# Patient Record
Sex: Male | Born: 1965
Health system: Southern US, Community
[De-identification: ages and names within clinical notes are randomized; demographics above are authoritative.]

## PROBLEM LIST (undated history)

## (undated) DIAGNOSIS — A0472 Enterocolitis due to Clostridium difficile, not specified as recurrent: Secondary | ICD-10-CM

## (undated) DIAGNOSIS — I4901 Ventricular fibrillation: Secondary | ICD-10-CM

## (undated) DIAGNOSIS — I2510 Atherosclerotic heart disease of native coronary artery without angina pectoris: Secondary | ICD-10-CM

## (undated) DIAGNOSIS — I5022 Chronic systolic (congestive) heart failure: Secondary | ICD-10-CM

## (undated) DIAGNOSIS — I700 Atherosclerosis of aorta: Secondary | ICD-10-CM

## (undated) DIAGNOSIS — I472 Ventricular tachycardia, unspecified: Secondary | ICD-10-CM

## (undated) DIAGNOSIS — I255 Ischemic cardiomyopathy: Secondary | ICD-10-CM

## (undated) DIAGNOSIS — E785 Hyperlipidemia, unspecified: Secondary | ICD-10-CM

## (undated) DIAGNOSIS — Z5189 Encounter for other specified aftercare: Secondary | ICD-10-CM

## (undated) DIAGNOSIS — K219 Gastro-esophageal reflux disease without esophagitis: Secondary | ICD-10-CM

## (undated) DIAGNOSIS — I739 Peripheral vascular disease, unspecified: Secondary | ICD-10-CM

## (undated) DIAGNOSIS — I219 Acute myocardial infarction, unspecified: Secondary | ICD-10-CM

## (undated) DIAGNOSIS — N1831 Chronic kidney disease, stage 3a: Secondary | ICD-10-CM

## (undated) DIAGNOSIS — E669 Obesity, unspecified: Secondary | ICD-10-CM

## (undated) DIAGNOSIS — I10 Essential (primary) hypertension: Secondary | ICD-10-CM

## (undated) DIAGNOSIS — F1210 Cannabis abuse, uncomplicated: Secondary | ICD-10-CM

## (undated) DIAGNOSIS — K921 Melena: Secondary | ICD-10-CM

## (undated) DIAGNOSIS — I5042 Chronic combined systolic (congestive) and diastolic (congestive) heart failure: Secondary | ICD-10-CM

## (undated) DIAGNOSIS — I509 Heart failure, unspecified: Secondary | ICD-10-CM

## (undated) DIAGNOSIS — I471 Supraventricular tachycardia, unspecified: Secondary | ICD-10-CM

## (undated) DIAGNOSIS — Z87891 Personal history of nicotine dependence: Secondary | ICD-10-CM

## (undated) DIAGNOSIS — I480 Paroxysmal atrial fibrillation: Secondary | ICD-10-CM

## (undated) HISTORY — DX: Acute myocardial infarction, unspecified: I21.9

## (undated) HISTORY — DX: Ventricular fibrillation: I49.01

## (undated) HISTORY — DX: Peripheral vascular disease, unspecified: I73.9

## (undated) HISTORY — DX: Ischemic cardiomyopathy: I25.5

## (undated) HISTORY — DX: Melena: K92.1

## (undated) HISTORY — DX: Cannabis abuse, uncomplicated: F12.10

## (undated) HISTORY — DX: Atherosclerotic heart disease of native coronary artery without angina pectoris: I25.10

## (undated) HISTORY — DX: Heart failure, unspecified: I50.9

## (undated) HISTORY — DX: Essential (primary) hypertension: I10

## (undated) HISTORY — DX: Gastro-esophageal reflux disease without esophagitis: K21.9

## (undated) HISTORY — DX: Hyperlipidemia, unspecified: E78.5

## (undated) HISTORY — PX: CIRCUMCISION: SUR203

## (undated) HISTORY — PX: CARDIAC DEFIBRILLATOR PLACEMENT: SHX171

## (undated) HISTORY — DX: Encounter for other specified aftercare: Z51.89

---

## 2001-05-28 ENCOUNTER — Encounter: Payer: Self-pay | Admitting: Emergency Medicine

## 2001-05-28 ENCOUNTER — Emergency Department (HOSPITAL_COMMUNITY): Admission: EM | Admit: 2001-05-28 | Discharge: 2001-05-28 | Payer: Self-pay | Admitting: Emergency Medicine

## 2001-06-13 ENCOUNTER — Encounter: Payer: Self-pay | Admitting: Cardiology

## 2001-06-13 ENCOUNTER — Inpatient Hospital Stay (HOSPITAL_COMMUNITY): Admission: AD | Admit: 2001-06-13 | Discharge: 2001-06-22 | Payer: Self-pay | Admitting: Cardiology

## 2001-06-17 ENCOUNTER — Encounter: Payer: Self-pay | Admitting: Cardiology

## 2001-06-17 HISTORY — PX: CORONARY ARTERY BYPASS GRAFT: SHX141

## 2001-06-18 ENCOUNTER — Encounter: Payer: Self-pay | Admitting: Cardiothoracic Surgery

## 2001-06-19 ENCOUNTER — Encounter: Payer: Self-pay | Admitting: Cardiothoracic Surgery

## 2001-06-20 ENCOUNTER — Encounter: Payer: Self-pay | Admitting: Cardiothoracic Surgery

## 2001-06-21 ENCOUNTER — Encounter: Payer: Self-pay | Admitting: Cardiothoracic Surgery

## 2001-06-25 ENCOUNTER — Encounter: Payer: Self-pay | Admitting: *Deleted

## 2001-06-25 ENCOUNTER — Emergency Department (HOSPITAL_COMMUNITY): Admission: EM | Admit: 2001-06-25 | Discharge: 2001-06-26 | Payer: Self-pay | Admitting: *Deleted

## 2001-07-12 ENCOUNTER — Encounter: Admission: RE | Admit: 2001-07-12 | Discharge: 2001-07-12 | Payer: Self-pay | Admitting: Cardiothoracic Surgery

## 2001-07-12 ENCOUNTER — Encounter: Payer: Self-pay | Admitting: Cardiothoracic Surgery

## 2004-08-19 ENCOUNTER — Ambulatory Visit (HOSPITAL_COMMUNITY): Admission: RE | Admit: 2004-08-19 | Discharge: 2004-08-19 | Payer: Self-pay | Admitting: Family Medicine

## 2004-12-07 ENCOUNTER — Ambulatory Visit: Payer: Self-pay | Admitting: Cardiology

## 2004-12-07 ENCOUNTER — Emergency Department (HOSPITAL_COMMUNITY): Admission: EM | Admit: 2004-12-07 | Discharge: 2004-12-07 | Payer: Self-pay | Admitting: Emergency Medicine

## 2005-08-23 ENCOUNTER — Ambulatory Visit: Payer: Self-pay | Admitting: Cardiology

## 2005-09-28 ENCOUNTER — Ambulatory Visit: Payer: Self-pay

## 2005-10-23 ENCOUNTER — Ambulatory Visit: Payer: Self-pay | Admitting: Cardiology

## 2005-11-02 ENCOUNTER — Ambulatory Visit: Payer: Self-pay | Admitting: Cardiology

## 2005-11-27 ENCOUNTER — Ambulatory Visit: Payer: Self-pay | Admitting: Cardiology

## 2005-12-25 ENCOUNTER — Ambulatory Visit: Payer: Self-pay | Admitting: Cardiology

## 2006-01-05 ENCOUNTER — Ambulatory Visit: Payer: Self-pay | Admitting: Cardiology

## 2006-02-16 ENCOUNTER — Ambulatory Visit: Payer: Self-pay | Admitting: Cardiology

## 2006-04-13 ENCOUNTER — Ambulatory Visit: Payer: Self-pay | Admitting: Cardiology

## 2006-04-13 LAB — CONVERTED CEMR LAB
Alkaline Phosphatase: 79 units/L (ref 39–117)
Total Protein: 7.1 g/dL (ref 6.0–8.3)

## 2006-04-26 ENCOUNTER — Encounter: Payer: Self-pay | Admitting: Cardiology

## 2006-04-26 ENCOUNTER — Ambulatory Visit: Payer: Self-pay

## 2006-05-17 ENCOUNTER — Ambulatory Visit: Payer: Self-pay | Admitting: Cardiovascular Disease

## 2006-05-18 ENCOUNTER — Ambulatory Visit: Payer: Self-pay | Admitting: Cardiovascular Disease

## 2006-05-30 ENCOUNTER — Ambulatory Visit (HOSPITAL_COMMUNITY): Admission: RE | Admit: 2006-05-30 | Discharge: 2006-05-30 | Payer: Self-pay | Admitting: Cardiovascular Disease

## 2006-06-18 ENCOUNTER — Ambulatory Visit: Payer: Self-pay | Admitting: Cardiovascular Disease

## 2006-06-18 ENCOUNTER — Ambulatory Visit: Payer: Self-pay

## 2006-07-19 ENCOUNTER — Ambulatory Visit: Payer: Self-pay | Admitting: Cardiology

## 2006-07-23 ENCOUNTER — Ambulatory Visit (HOSPITAL_COMMUNITY): Admission: RE | Admit: 2006-07-23 | Discharge: 2006-07-23 | Payer: Self-pay | Admitting: Urology

## 2007-03-08 ENCOUNTER — Ambulatory Visit: Payer: Self-pay | Admitting: Pediatrics

## 2007-03-11 ENCOUNTER — Ambulatory Visit: Payer: Self-pay

## 2007-04-17 ENCOUNTER — Ambulatory Visit: Payer: Self-pay | Admitting: Cardiology

## 2007-04-17 LAB — CONVERTED CEMR LAB
AST: 27 units/L (ref 0–37)
Albumin: 4 g/dL (ref 3.5–5.2)
Alkaline Phosphatase: 65 units/L (ref 39–117)
Bilirubin, Direct: 0.1 mg/dL (ref 0.0–0.3)
Cholesterol: 178 mg/dL (ref 0–200)
HDL: 39.7 mg/dL (ref >39.0)
LDL Cholesterol: 119 mg/dL — ABNORMAL HIGH (ref 0–99)
Total Bilirubin: 0.8 mg/dL (ref 0.3–1.2)
Total CHOL/HDL Ratio: 4.5
VLDL: 20 mg/dL (ref 0–40)

## 2007-05-13 ENCOUNTER — Ambulatory Visit: Payer: Self-pay | Admitting: Cardiology

## 2007-12-19 ENCOUNTER — Ambulatory Visit: Payer: Self-pay | Admitting: Cardiology

## 2007-12-25 ENCOUNTER — Ambulatory Visit: Payer: Self-pay

## 2007-12-25 ENCOUNTER — Ambulatory Visit: Payer: Self-pay | Admitting: Cardiology

## 2007-12-25 LAB — CONVERTED CEMR LAB
AST: 34 units/L (ref 0–37)
Albumin: 4 g/dL (ref 3.5–5.2)
Alkaline Phosphatase: 82 units/L (ref 39–117)
BUN: 18 mg/dL (ref 6–23)
Bilirubin, Direct: 0.1 mg/dL (ref 0.0–0.3)
CO2: 26 meq/L (ref 19–32)
Calcium: 9.2 mg/dL (ref 8.4–10.5)
Chloride: 112 meq/L (ref 96–112)
Cholesterol: 149 mg/dL (ref 0–200)
Creatinine, Ser: 1 mg/dL (ref 0.4–1.5)
GFR calc Af Amer: 106 mL/min
GFR calc non Af Amer: 88 mL/min
LDL Cholesterol: 94 mg/dL (ref 0–99)
Potassium: 4.1 meq/L (ref 3.5–5.1)
Sodium: 140 meq/L (ref 135–145)
Total Bilirubin: 0.9 mg/dL (ref 0.3–1.2)
Total CHOL/HDL Ratio: 3.4
Total Protein: 7 g/dL (ref 6.0–8.3)

## 2007-12-26 ENCOUNTER — Inpatient Hospital Stay (HOSPITAL_COMMUNITY): Admission: AD | Admit: 2007-12-26 | Discharge: 2007-12-28 | Payer: Self-pay | Admitting: Cardiology

## 2007-12-26 ENCOUNTER — Ambulatory Visit: Payer: Self-pay | Admitting: Cardiology

## 2008-01-12 ENCOUNTER — Emergency Department (HOSPITAL_COMMUNITY): Admission: EM | Admit: 2008-01-12 | Discharge: 2008-01-12 | Payer: Self-pay | Admitting: Emergency Medicine

## 2008-01-13 ENCOUNTER — Ambulatory Visit: Payer: Self-pay | Admitting: Cardiology

## 2008-01-15 DIAGNOSIS — E7800 Pure hypercholesterolemia, unspecified: Secondary | ICD-10-CM | POA: Insufficient documentation

## 2008-01-15 DIAGNOSIS — I739 Peripheral vascular disease, unspecified: Secondary | ICD-10-CM

## 2008-01-15 DIAGNOSIS — I2510 Atherosclerotic heart disease of native coronary artery without angina pectoris: Secondary | ICD-10-CM

## 2008-01-15 DIAGNOSIS — K921 Melena: Secondary | ICD-10-CM | POA: Insufficient documentation

## 2008-01-15 DIAGNOSIS — E785 Hyperlipidemia, unspecified: Secondary | ICD-10-CM

## 2008-01-15 DIAGNOSIS — Z951 Presence of aortocoronary bypass graft: Secondary | ICD-10-CM | POA: Insufficient documentation

## 2008-01-15 DIAGNOSIS — R079 Chest pain, unspecified: Secondary | ICD-10-CM | POA: Insufficient documentation

## 2008-01-15 DIAGNOSIS — I10 Essential (primary) hypertension: Secondary | ICD-10-CM | POA: Insufficient documentation

## 2008-01-17 ENCOUNTER — Ambulatory Visit: Payer: Self-pay | Admitting: Internal Medicine

## 2008-01-27 ENCOUNTER — Ambulatory Visit: Payer: Self-pay | Admitting: Internal Medicine

## 2008-02-12 ENCOUNTER — Ambulatory Visit: Payer: Self-pay | Admitting: Cardiology

## 2008-02-12 LAB — CONVERTED CEMR LAB
Albumin: 4 g/dL (ref 3.5–5.2)
Cholesterol: 158 mg/dL (ref 0–200)
LDL Cholesterol: 101 mg/dL — ABNORMAL HIGH (ref 0–99)
Triglycerides: 70 mg/dL (ref 0–149)

## 2008-03-11 ENCOUNTER — Ambulatory Visit (HOSPITAL_COMMUNITY): Admission: RE | Admit: 2008-03-11 | Discharge: 2008-03-11 | Payer: Self-pay | Admitting: Internal Medicine

## 2008-03-11 ENCOUNTER — Ambulatory Visit: Payer: Self-pay | Admitting: Cardiovascular Disease

## 2008-08-25 DIAGNOSIS — I255 Ischemic cardiomyopathy: Secondary | ICD-10-CM | POA: Insufficient documentation

## 2008-09-09 ENCOUNTER — Encounter (INDEPENDENT_AMBULATORY_CARE_PROVIDER_SITE_OTHER): Payer: Self-pay | Admitting: *Deleted

## 2009-02-03 ENCOUNTER — Ambulatory Visit: Payer: Self-pay | Admitting: Cardiovascular Disease

## 2009-02-03 ENCOUNTER — Ambulatory Visit: Payer: Self-pay | Admitting: Pulmonary Disease

## 2009-02-03 ENCOUNTER — Inpatient Hospital Stay (HOSPITAL_COMMUNITY): Admission: EM | Admit: 2009-02-03 | Discharge: 2009-02-12 | Payer: Self-pay | Admitting: Emergency Medicine

## 2009-02-03 ENCOUNTER — Ambulatory Visit: Payer: Self-pay | Admitting: Cardiology

## 2009-02-04 ENCOUNTER — Encounter: Payer: Self-pay | Admitting: Internal Medicine

## 2009-02-04 ENCOUNTER — Encounter: Payer: Self-pay | Admitting: Cardiovascular Disease

## 2009-02-05 ENCOUNTER — Encounter: Payer: Self-pay | Admitting: Critical Care Medicine

## 2009-02-06 ENCOUNTER — Encounter: Payer: Self-pay | Admitting: Critical Care Medicine

## 2009-02-08 ENCOUNTER — Encounter: Payer: Self-pay | Admitting: Cardiovascular Disease

## 2009-02-09 ENCOUNTER — Encounter: Payer: Self-pay | Admitting: Cardiovascular Disease

## 2009-02-11 DIAGNOSIS — I4901 Ventricular fibrillation: Secondary | ICD-10-CM

## 2009-02-11 HISTORY — DX: Ventricular fibrillation: I49.01

## 2009-02-12 ENCOUNTER — Encounter: Payer: Self-pay | Admitting: Internal Medicine

## 2009-02-15 ENCOUNTER — Telehealth: Payer: Self-pay | Admitting: Cardiology

## 2009-02-15 ENCOUNTER — Ambulatory Visit: Payer: Self-pay | Admitting: Cardiology

## 2009-02-16 ENCOUNTER — Emergency Department (HOSPITAL_COMMUNITY): Admission: EM | Admit: 2009-02-16 | Discharge: 2009-02-16 | Payer: Self-pay | Admitting: Emergency Medicine

## 2009-02-19 ENCOUNTER — Encounter: Payer: Self-pay | Admitting: Cardiology

## 2009-02-20 ENCOUNTER — Inpatient Hospital Stay (HOSPITAL_COMMUNITY): Admission: EM | Admit: 2009-02-20 | Discharge: 2009-02-23 | Payer: Self-pay | Admitting: Emergency Medicine

## 2009-03-01 ENCOUNTER — Encounter: Payer: Self-pay | Admitting: Cardiology

## 2009-03-02 ENCOUNTER — Encounter: Payer: Self-pay | Admitting: Nurse Practitioner

## 2009-03-02 ENCOUNTER — Ambulatory Visit: Payer: Self-pay

## 2009-03-02 DIAGNOSIS — I4901 Ventricular fibrillation: Secondary | ICD-10-CM | POA: Insufficient documentation

## 2009-03-03 ENCOUNTER — Encounter: Payer: Self-pay | Admitting: Internal Medicine

## 2009-03-03 ENCOUNTER — Ambulatory Visit: Payer: Self-pay

## 2009-03-08 ENCOUNTER — Encounter: Payer: Self-pay | Admitting: Cardiology

## 2009-03-08 ENCOUNTER — Encounter (INDEPENDENT_AMBULATORY_CARE_PROVIDER_SITE_OTHER): Payer: Self-pay | Admitting: *Deleted

## 2009-03-14 ENCOUNTER — Inpatient Hospital Stay (HOSPITAL_COMMUNITY): Admission: EM | Admit: 2009-03-14 | Discharge: 2009-03-16 | Payer: Self-pay | Admitting: Emergency Medicine

## 2009-03-15 ENCOUNTER — Ambulatory Visit: Payer: Self-pay | Admitting: Gastroenterology

## 2009-03-24 ENCOUNTER — Encounter: Payer: Self-pay | Admitting: Cardiology

## 2009-04-07 ENCOUNTER — Encounter: Payer: Self-pay | Admitting: Cardiology

## 2009-04-08 ENCOUNTER — Encounter: Payer: Self-pay | Admitting: Cardiology

## 2009-04-12 ENCOUNTER — Encounter (INDEPENDENT_AMBULATORY_CARE_PROVIDER_SITE_OTHER): Payer: Self-pay | Admitting: *Deleted

## 2009-04-19 ENCOUNTER — Encounter: Payer: Self-pay | Admitting: Cardiology

## 2009-04-30 ENCOUNTER — Encounter: Payer: Self-pay | Admitting: Cardiology

## 2009-06-01 ENCOUNTER — Ambulatory Visit: Payer: Self-pay | Admitting: Internal Medicine

## 2009-06-03 ENCOUNTER — Telehealth (INDEPENDENT_AMBULATORY_CARE_PROVIDER_SITE_OTHER): Payer: Self-pay | Admitting: *Deleted

## 2009-06-08 ENCOUNTER — Encounter: Payer: Self-pay | Admitting: Internal Medicine

## 2009-06-08 LAB — CONVERTED CEMR LAB
BUN: 11 mg/dL (ref 6–23)
Calcium: 9.4 mg/dL (ref 8.4–10.5)
GFR calc non Af Amer: 93.94 mL/min (ref >60)

## 2009-07-13 ENCOUNTER — Encounter (INDEPENDENT_AMBULATORY_CARE_PROVIDER_SITE_OTHER): Payer: Self-pay | Admitting: *Deleted

## 2009-09-03 ENCOUNTER — Encounter (INDEPENDENT_AMBULATORY_CARE_PROVIDER_SITE_OTHER): Payer: Self-pay | Admitting: *Deleted

## 2010-03-30 ENCOUNTER — Encounter (INDEPENDENT_AMBULATORY_CARE_PROVIDER_SITE_OTHER): Payer: Self-pay | Admitting: *Deleted

## 2010-04-20 ENCOUNTER — Encounter (INDEPENDENT_AMBULATORY_CARE_PROVIDER_SITE_OTHER): Payer: Self-pay | Admitting: *Deleted

## 2010-04-24 ENCOUNTER — Encounter: Payer: Self-pay | Admitting: Internal Medicine

## 2010-04-25 ENCOUNTER — Encounter: Payer: Self-pay | Admitting: Internal Medicine

## 2010-05-02 ENCOUNTER — Encounter: Payer: Self-pay | Admitting: Internal Medicine

## 2010-05-03 NOTE — Assessment & Plan Note (Signed)
Summary: PC2/ST JUIDE/JML   Visit Type:  Follow-up  CC:  no complaints and feeling well.  History of Present Illness: Billy [name] is a 45 year old American gentleman with a history of coronary artery disease and bypass grafting. Many years ago ICD implantation and recommended he had deferred. Last fall he had reported cardiac arrest. His ejection fraction at that time by echo was 15% although followup echo followup to that showed ejection fraction of 40-45%.  He is now status post ICD implantation.  The patient denies SOB, chest pain, edema or palpitations          Current Problems (verified): 1)  Ventricular Fibrillation  (ICD-427.41) 2)  Coronary Artery Disease  (ICD-414.00) 3)  Ventricular Tachycardia...polymorphic  (ICD-427.1) 4)  Cardiomyopathy, Ischemic  (ICD-414.8) 5)  Peripheral Vascular Disease  (ICD-443.9) 6)  Hypertension  (ICD-401.9) 7)  Hyperlipidemia  (ICD-272.4) 8)  Chest Pain  (ICD-786.50) 9)  Hematochezia  (ICD-578.1)  Current Medications (verified): 1)  Adult Aspirin Ec Low Strength 81 Mg Tbec (Aspirin) .... Take One By Mouth Once Daily 2)  Multivitamins  Tabs (Multiple Vitamin) .... Take One By Mouth Once Daily 3)  Lipitor 80 Mg Tabs (Atorvastatin Calcium) .... Take One Tablet By Mouth Daily. 4)  Carvedilol 6.25 Mg Tabs (Carvedilol) .... Take One Tablet By Mouth Twice A Day 5)  Lortab 5 5-500 Mg Tabs (Hydrocodone-Acetaminophen) .... Take As Needed 6)  Florastor 250 Mg Caps (Saccharomyces Boulardii) .... Take 1 By Mouth Two Times A Day 7)  Furosemide 20 Mg Tabs (Furosemide) .... Take One Tablet By Mouth Daily. 8)  Lisinopril 20 Mg Tabs (Lisinopril) .... Take One Tablet By Mouth Daily 9)  Nexium 40 Mg Cpdr (Esomeprazole Magnesium) .... Take 1 By Mouth Once Daily 10)  Potassium Chloride Crys Cr 20 Meq Cr-Tabs (Potassium Chloride Crys Cr) .... Take One Tablet By Mouth Daily  Allergies (verified): No Known Drug Allergies  Past History:  Past Medical  History: Last updated: 03/02/2009 1. Out-of-hospital ventricular fibrillation arrest.      a. February 11, 2009, successful placement of a St. Biomedical engineer single lead automatic implantable cardioverter-     defibrillator. 2. Coronary artery disease      a.status post coronary artery bypass graft      b. Non-ST-segment elevation myocardial 02/2009      c. catheterization 02/2009 revealing stable anatomy - patent grafts 3. Hypertension. 4. Hyperlipidemia. 5. Ischemic cardiomyopathy with most recent echocardiogram revealing     an ejection fraction of 40-45% on February 09, 2009. 6. Anoxic encephalopathy. 7. Chronic renal failure 8. Marijuana abuse 9. PERIPHERAL VASCULAR DISEASE (ICD-443.9) 10. HEMATOCHEZIA (ICD-578.1) 11. ICM - Chronic Syst. CHF - EF 40-45%  Past Surgical History: Last updated: 05/28/2009 circumcision CABG x4 .Marland KitchenMarland Kitchen3/17/03 Implantation AICD - St. Jude Fortify  Family History: Last updated: 08/25/2008 Family History of Hypertension: Mother and most of his siblings Family History of Coronary Artery Disease: Father died at 81 MI.. Siblings: pt has 3 borthers and 2 sisters.[name]1 sister has CAD and had CABG  Social History: Last updated: 08/25/2008 Married  Tobacco Use - Former. Marland KitchenMarland Kitchen2ppdx36yrs..quit 2005 Alcohol Use - no Regular Exercise - no Drug Use - no  Risk Factors: Exercise: no (08/25/2008)  Risk Factors: Smoking Status: quit (08/25/2008)  Vital Signs:  Patient profile:   45 year old male Height:      70 inches Weight:      227.4 pounds BMI:     32.75 Pulse rate:   68 /  minute Pulse rhythm:   regular BP sitting:   113 / 81  (left arm) Cuff size:   large  Vitals Entered By: Judithe Modest CMA (June 01, 2009 9:23 AM)  Physical Exam  General:  The patient was alert and oriented in no acute distress. HEENT Normal.  Neck veins were flat, carotids were brisk.  Lungs were clear.  Heart sounds were regular without murmurs or gallops.    Abdomen was soft with active bowel sounds. There is no clubbing cyanosis or edema. Skin Warm and dry     ICD Specifications Following MD:  Sherryl Manges, MD     Referring MD:  Jens Som ICD Vendor:  St Jude     ICD Model Number:  [id number]     ICD Serial Number:  [id number] ICD DOI:  02/11/2009     ICD Implanting MD:  Sherryl Manges, MD  Lead 1:    Location: RV     DOI: 02/11/2009     Model #: 8119     Serial #: [id number]     Status: active  Indications::  VF ARREST   ICD Follow Up Remote Check?  No Charge Time:  8.1 seconds     Battery Est. Longevity:  8.2 years Underlying rhythm:  SR ICD Dependent:  No       ICD Device Measurements Right Ventricle:  Amplitude: 11.9 mV, Impedance: 530 ohms, Threshold: 0.5 V at 0.5 msec  Episodes Coumadin:  No Ventricular Pacing:  <1%  Brady Parameters Mode VVI     Lower Rate Limit:  40      Tachy Zones VF:  240     VT:  200     Next Remote Date:  08/30/2009     Next Cardiology Appt Due:  02/01/2010 Tech Comments:  No parameter changes.  Device function normal.  Merlin transmissions every 3 months.  ROV 11/11 with Dr. Graciela Husbands. Altha Harm, LPN  June 01, 2128 9:38 AM   Impression & Recommendations:  Problem # 1:  VENTRICULAR FIBRILLATION (ICD-427.41) Aborted cardiac arrest.  no intercurrent arrhythmia  Problem # 2:  CARDIOMYOPATHY, ISCHEMIC (ICD-414.8) hmost recent ejection fraction assessment was 40-45%. We'll plan to increase his carvedilol to 6.25-12.5 mg twice daily and will check his metabolic profile and hopefully would come back to see Dr. Jens Som adjustment of his diuretics would be possible. It may well be that based on the Emphasis Trial that Aldactone would be appropriate His updated medication list for this problem includes:    Adult Aspirin Ec Low Strength 81 Mg Tbec (Aspirin) .[name]... Take one by mouth once daily    Carvedilol 12.5 Mg Tabs (Carvedilol) .[name]..[name] Two times a day    Furosemide 20 Mg Tabs (Furosemide) .[name]... Take one tablet by  mouth daily.    Lisinopril 20 Mg Tabs (Lisinopril) .[name]... Take one tablet by mouth daily  Orders: TLB-BMP (Basic Metabolic Panel-BMET) (80048-METABOL)  Problem # 3:  IMPLANTABLE B DEFIBRILLATOR (ICD-V45.02) Device parameters and data were reviewed and no changes were made  Patient Instructions: 1)  Your physician recommends that you HAVE LABS TODAY: BMET 2)  Your physician has recommended you make the following change in your medication: INCREASE YOUR CARVEDILOL TO 12.5MG  two times a day  3)  Your physician recommends that you schedule a follow-up appointment in: 4 WEEKS WITH DR. CRENSHAW AND 11/ 2011 WITH DR. Graciela Husbands Prescriptions: CARVEDILOL 12.5 MG TABS (CARVEDILOL) two times a day  #60 x 11   Entered by:  Duncan Dull, RN, BSN   Authorized by:   Nathen May, MD, [facility]   Signed by:   Duncan Dull, RN, BSN on 06/01/2009   Method used:   Electronically to        [facility] [phone]* (retail)       [address] [place], [place]  [address]       Ph: [phone] or [phone]       Fax: [phone]   RxID:   [id number]

## 2010-05-03 NOTE — Miscellaneous (Signed)
Summary: [facility]   [facility]   Imported By: Roderic Ovens 05/04/2009 16:03:58  _____________________________________________________________________  External Attachment:    Type:   Image     Comment:   External Document

## 2010-05-03 NOTE — Progress Notes (Signed)
Summary:  Lab Results  Phone Note Outgoing Call   Call placed by: Duncan Dull, RN, BSN,  June 03, 2009 2:35 PM Call placed to: Patient Summary of Call: Called patient and left message on machine To discuss labs.  Initial call taken by: Duncan Dull, RN, BSN,  June 03, 2009 2:35 PM  Follow-up for Phone Call        Fast busy signal Duncan Dull, RN, BSN  June 07, 2009 2:09 PM   Additional Follow-up for Phone Call Additional follow up Details #1::        Fast busy signal. Letter mailed.  Additional Follow-up by: Duncan Dull, RN, BSN,  June 08, 2009 3:25 PM

## 2010-05-03 NOTE — Miscellaneous (Signed)
Summary: [facility]  [facility]   Imported By: Roderic Ovens 05/04/2009 16:01:00  _____________________________________________________________________  External Attachment:    Type:   Image     Comment:   External Document

## 2010-05-03 NOTE — Miscellaneous (Signed)
Summary: [facility] Report  [facility]   Imported By: Kassie Mends 04/28/2009 10:56:50  _____________________________________________________________________  External Attachment:    Type:   Image     Comment:   External Document

## 2010-05-03 NOTE — Miscellaneous (Signed)
Summary: [facility]  [facility]   Imported By: Kassie Mends 04/20/2009 09:18:07  _____________________________________________________________________  External Attachment:    Type:   Image     Comment:   External Document

## 2010-05-03 NOTE — Letter (Signed)
Summary: Device-Delinquent Phone Transmission  [facility]  [address]   [place], [place] [address]   Phone: [phone]  Fax: [phone]     September 03, 2009 MRN: [id number]   Billy [name] [address] [place], [place]  [address]   Dear [name],  According to our records, you were scheduled for a device phone transmission on                              08/30/09.     We did not receive any results from this check.  If you transmitted on your scheduled day, please call us to help troubleshoot your system.  If you forgot to send your transmission, please send one upon receipt of this letter.  Thank you,  Milana Na, EMT-P  September 03, 2009 2:48 PM  [facility]

## 2010-05-03 NOTE — Miscellaneous (Signed)
Summary: [facility]  [facility]   Imported By: Roderic Ovens 04/22/2009 14:56:05  _____________________________________________________________________  External Attachment:    Type:   Image     Comment:   External Document

## 2010-05-03 NOTE — Miscellaneous (Signed)
Summary: [facility]  [facility]   Imported By: Roderic Ovens 04/19/2009 12:24:08  _____________________________________________________________________  External Attachment:    Type:   Image     Comment:   External Document

## 2010-05-03 NOTE — Letter (Signed)
Summary: Device-Delinquent Phone Transmission  [facility]  [address]   [place], [place] [address]   Phone: [phone]  Fax: [phone]     September 03, 2009 MRN: [id number]   Billy [name] [address] [place], [place]  [address]   Dear Mr. LOFTON,  According to our records, you were scheduled for a device phone transmission on                              .     We did not receive any results from this check.  If you transmitted on your scheduled day, please call us to help troubleshoot your system.  If you forgot to send your transmission, please send one upon receipt of this letter.  Thank you,   [facility]

## 2010-05-03 NOTE — Miscellaneous (Signed)
Summary: [facility]  [facility]   Imported By: Roderic Ovens 04/14/2009 13:11:43  _____________________________________________________________________  External Attachment:    Type:   Image     Comment:   External Document

## 2010-05-03 NOTE — Letter (Signed)
Summary: Appointment - Missed  [facility]     [place], [place]    Phone:   Fax:      July 13, 2009 MRN: [id number]   MEHKAI GALLO [address] [place], [place]  [address]   Dear Mr. HOFFMANN,  Our records indicate you missed your appointment on 07-08-2009   with  Dr.Crenshaw  It is very important that we reach you to reschedule this appointment. We look forward to participating in your health care needs. Please contact us at the number listed above at your earliest convenience to reschedule this appointment.     Sincerely,      Lorne Skeens  [employer] Scheduling Team

## 2010-05-03 NOTE — Letter (Signed)
Summary: Results Follow-up  [facility], Main Office  [address]   [place], [place] [address]   Phone: [phone]  Fax: [phone]     June 08, 2009 MRN: [id number]   JACOBEY GURA [address] [place], [place]  [address]   Dear Mr. DEVAUL,  We have received the results from your recent tests and have been unable to contact you.  Please call our office at the number listed above so that Dr.  Graciela Husbands or his nurse may review the results with you.    Thank you,  [facility] Melanie M. Sherral Hammers, Charity fundraiser, BSN

## 2010-05-03 NOTE — Cardiovascular Report (Signed)
Summary: Office Visit   Office Visit   Imported By: Roderic Ovens 04/15/2009 12:39:06  _____________________________________________________________________  External Attachment:    Type:   Image     Comment:   External Document

## 2010-05-03 NOTE — Miscellaneous (Signed)
Summary: [facility] Plan of Care   [facility] Plan [facility] Care   Imported By: Kassie Mends 04/20/2009 09:28:37  _____________________________________________________________________  External Attachment:    Type:   Image     Comment:   External Document

## 2010-05-03 NOTE — Letter (Signed)
Summary: Cardiac Rehabilitation Phase 2  Cardiac Rehabilitation Phase 2   Imported By: Kassie Mends 04/20/2009 08:48:46  _____________________________________________________________________  External Attachment:    Type:   Image     Comment:   External Document

## 2010-05-03 NOTE — Letter (Signed)
Summary: [facility]  [facility]   Imported By: Marylou Mccoy 04/22/2009 12:45:41  _____________________________________________________________________  External Attachment:    Type:   Image     Comment:   External Document

## 2010-05-03 NOTE — Letter (Signed)
Summary: Appointment - Missed  [facility]     [place], [place]    Phone:   Fax:      April 12, 2009 MRN: [id number]   Billy [name] [address] [place], [place]  [address]   Dear [name],  Our records indicate you missed your appointment on 1-10-2011with  Dr. Jens Som It is very important that we reach you to reschedule this appointment. We look forward to participating in your health care needs. Please contact us at the number listed above at your earliest convenience to reschedule this appointment.     Sincerely,   Lorne Skeens  [facility] Scheduling Team

## 2010-05-05 NOTE — Cardiovascular Report (Signed)
Summary: Certified Letter Sent - Not doing f/u  Certified Letter Sent - Not doing f/u   Imported By: Debby Freiberg 04/29/2010 12:55:09  _____________________________________________________________________  External Attachment:    Type:   Image     Comment:   External Document

## 2010-05-05 NOTE — Letter (Signed)
Summary: Device-Delinquent Check  [facility], Main Office  [address]   [place], [place] [address]   Phone: [phone]  Fax: [phone]     April 20, 2010 MRN: [id number]   Billy [name] [address] [place], [place]  [address]   Dear [name],  According to our records, you have not had your implanted device checked in the recommended period of time.  We are unable to determine appropriate device function without checking your device on a regular basis.  Please call our office to schedule an appointment,with Dr Graciela Husbands,  as soon as possible.  If you are having your device checked by another physician, please call us so that we may update our records.  Thank you,  Letta Moynahan, EMT  April 20, 2010 11:52 AM  [facility] certified

## 2010-05-05 NOTE — Letter (Signed)
Summary: Device-Delinquent Check  [facility], Main Office  [address]   [place], [place] [address]   Phone: [phone]  Fax: [phone]     March 30, 2010 MRN: [id number]   Billy [name] [address] [place], [place]  [address]   Dear Billy [name],  According to our records, you have not had your implanted device checked in the recommended period of time.  We are unable to determine appropriate device function without checking your device on a regular basis.  Please call our office to schedule an appointment with Dr Graciela Husbands in March 2012.  If you are having your device checked by another physician, please call us so that we may update our records.  Thank you,  Vella Kohler  March 30, 2010 9:38 AM   [facility] certified

## 2010-05-06 ENCOUNTER — Emergency Department (HOSPITAL_COMMUNITY)
Admission: EM | Admit: 2010-05-06 | Discharge: 2010-05-06 | Disposition: A | Discharge status: Home / Self Care | Payer: Medicare Other | Attending: Emergency Medicine | Admitting: Emergency Medicine

## 2010-05-06 ENCOUNTER — Emergency Department (HOSPITAL_COMMUNITY): Payer: Medicare Other

## 2010-05-06 DIAGNOSIS — I4892 Unspecified atrial flutter: Secondary | ICD-10-CM | POA: Insufficient documentation

## 2010-05-06 DIAGNOSIS — Y838 Other surgical procedures as the cause of abnormal reaction of the patient, or of later complication, without mention of misadventure at the time of the procedure: Secondary | ICD-10-CM | POA: Insufficient documentation

## 2010-05-06 DIAGNOSIS — T82897A Other specified complication of cardiac prosthetic devices, implants and grafts, initial encounter: Secondary | ICD-10-CM | POA: Insufficient documentation

## 2010-05-06 DIAGNOSIS — Z951 Presence of aortocoronary bypass graft: Secondary | ICD-10-CM | POA: Insufficient documentation

## 2010-05-06 DIAGNOSIS — Z79899 Other long term (current) drug therapy: Secondary | ICD-10-CM | POA: Insufficient documentation

## 2010-05-06 DIAGNOSIS — I10 Essential (primary) hypertension: Secondary | ICD-10-CM | POA: Insufficient documentation

## 2010-05-06 DIAGNOSIS — Z95810 Presence of automatic (implantable) cardiac defibrillator: Secondary | ICD-10-CM | POA: Insufficient documentation

## 2010-05-06 DIAGNOSIS — Z7982 Long term (current) use of aspirin: Secondary | ICD-10-CM | POA: Insufficient documentation

## 2010-05-06 DIAGNOSIS — I2510 Atherosclerotic heart disease of native coronary artery without angina pectoris: Secondary | ICD-10-CM | POA: Insufficient documentation

## 2010-05-06 LAB — POCT I-STAT, CHEM 8
BUN: 14 mg/dL (ref 6–23)
HCT: 49 % (ref 39.0–52.0)
Hemoglobin: 16.7 g/dL (ref 13.0–17.0)
Sodium: 140 mEq/L (ref 135–145)
TCO2: 24 mmol/L (ref 0–100)

## 2010-05-06 LAB — PHOSPHORUS: Phosphorus: 3.2 mg/dL (ref 2.3–4.6)

## 2010-05-11 ENCOUNTER — Ambulatory Visit: Payer: Medicare Other | Admitting: Cardiology

## 2010-05-18 ENCOUNTER — Telehealth (INDEPENDENT_AMBULATORY_CARE_PROVIDER_SITE_OTHER): Payer: Self-pay | Admitting: *Deleted

## 2010-05-25 NOTE — Progress Notes (Signed)
Summary: pt spouse rtn call  Phone Note Call from Patient Call back at [phone] or [phone]   Caller: Spouse Reason for Call: Talk to Nurse, Talk to Doctor Summary of Call: pt spouse rtn call from last week she didn't know who called or what they where calling about Initial call taken by: Omer Jack,  May 18, 2010 10:12 AM  Follow-up for Phone Call        according to chart the [facility] was trying to reach pt, will foward to them Deliah Goody, RN  May 18, 2010 3:47 PM\par  Additional Follow-up for Phone Call Additional follow up Details #1::        Spoke with patient and he will call tomorrow to schedule an appt. with Dr. Graciela Husbands for a device check/ Additional Follow-up by: Altha Harm, LPN,  May 18, 2010 5:23 PM

## 2010-05-25 NOTE — Cardiovascular Report (Signed)
Summary: Certified Letter Returned - Not doing f/u  Certified Letter Returned - Not doing f/u   Imported By: Debby Freiberg 05/16/2010 11:20:59  _____________________________________________________________________  External Attachment:    Type:   Image     Comment:   External Document

## 2010-05-30 ENCOUNTER — Encounter: Payer: Self-pay | Admitting: Internal Medicine

## 2010-05-30 ENCOUNTER — Ambulatory Visit (INDEPENDENT_AMBULATORY_CARE_PROVIDER_SITE_OTHER): Payer: Medicare Other | Admitting: Internal Medicine

## 2010-05-30 ENCOUNTER — Ambulatory Visit: Payer: Medicare Other | Admitting: Cardiology

## 2010-05-30 DIAGNOSIS — I4901 Ventricular fibrillation: Secondary | ICD-10-CM

## 2010-05-30 DIAGNOSIS — Z95810 Presence of automatic (implantable) cardiac defibrillator: Secondary | ICD-10-CM

## 2010-05-30 DIAGNOSIS — I2589 Other forms of chronic ischemic heart disease: Secondary | ICD-10-CM

## 2010-05-31 ENCOUNTER — Telehealth: Payer: Self-pay | Admitting: Internal Medicine

## 2010-06-09 NOTE — Cardiovascular Report (Signed)
Summary: Office Visit   Office Visit   Imported By: Roderic Ovens 06/02/2010 16:11:54  _____________________________________________________________________  External Attachment:    Type:   Image     Comment:   External Document

## 2010-06-09 NOTE — Progress Notes (Signed)
Summary: Pt returning call  Phone Note Call from Patient Call back at Home Phone [phone]   Caller: Patient Summary of Call: Pt returning call Initial call taken by: Judie Grieve,  May 31, 2010 10:55 AM  Follow-up for Phone Call        Phone Call Completed PT AWARE  NO PHONE CALL  NOTED IN PT'S CHART  NOT SURE OF REASON INFORMED PT  NOTHING NEEDED BEST  I CAN TELL. Follow-up by: Scherrie Bateman, LPN,  May 31, 2010 12:41 PM     Appended Document: Pt returning call pip i was calling him to make sure understands how important it is to follow up with dr Jens Som on a rgular basis    Appended Document: Pt returning call LEFT MESSAGE  THAT  DR Graciela Husbands HAD CALLED AND WANTED PT KNOW THE IMPORTANCE OF KEEPING F/U WITH DR CRENSHAW./CY

## 2010-06-09 NOTE — Assessment & Plan Note (Signed)
Summary: fu defib fired/mt   Visit Type:  Follow-up Primary Provider:  Lemont Fillers FNP  CC:  no complaints.  History of Present Illness: [name] is a 45 year old American gentleman with a history of coronary artery disease and bypass grafting. Many years ago ICD implantation and recommended he had deferred. Last fall he had reported cardiac arrest. His ejection fraction at that time by echo was 15% although followup echo followup to that showed ejection fraction of 40-45%.  He is now status post ICD implantation.  The patient denies SOB, chest pain, edema;  he got into an also with his brother for 4 weeks ago. He went out to the truck to sit down. It is earlier than when off. He was interrogated the emergency room. This is described below. He developed with a long short initiation sequence a wide-complex tachycardia that is bipolar morphology was not too distinct from sinus but it's far field was quite broad and different. There is also some polymorphic nature to it.         Current Medications (verified): 1)  Adult Aspirin Ec Low Strength 81 Mg Tbec (Aspirin) .... Take One By Mouth Once Daily 2)  Multivitamins  Tabs (Multiple Vitamin) .... Take One By Mouth Once Daily 3)  Lipitor 80 Mg Tabs (Atorvastatin Calcium) .... Take One Tablet By Mouth Daily. 4)  Carvedilol 12.5 Mg Tabs (Carvedilol) .... Two Times A Day 5)  Furosemide 20 Mg Tabs (Furosemide) .... Take One Tablet By Mouth Daily. 6)  Lisinopril 20 Mg Tabs (Lisinopril) .... Take One Tablet By Mouth Daily 7)  Nexium 40 Mg Cpdr (Esomeprazole Magnesium) .... Take 1 By Mouth Once Daily 8)  Potassium Chloride Crys Cr 20 Meq Cr-Tabs (Potassium Chloride Crys Cr) .[name].. 1 Tab By Mouth Once Daily  Allergies (verified): No Known Drug Allergies  Vital Signs:  Patient profile:   45 year old male Height:      70 inches Weight:      253 pounds BMI:     36.43 Pulse rate:   72 / minute BP sitting:   112 / 68  (left arm) Cuff  size:   large  Vitals Entered By: Hardin Negus, RMA (May 30, 2010 3:49 PM)  Physical Exam  General:  The patient was alert and oriented in no acute distress. HEENT Normal.  Neck veins were flat, carotids were brisk.  Lungs were clear.  Heart sounds were regular without murmurs or gallops.  Abdomen was soft with active bowel sounds. There is no clubbing cyanosis or edema. Skin Warm and dry     ICD Specifications Following MD:  Sherryl Manges, MD     Referring MD:  Jens Som ICD Vendor:  St Jude     ICD Model Number:  [id number]     ICD Serial Number:  [id number] ICD DOI:  02/11/2009     ICD Implanting MD:  Sherryl Manges, MD  Lead 1:    Location: RV     DOI: 02/11/2009     Model #: 8119     Serial #: [id number]     Status: active  Indications::  VF ARREST   ICD Follow Up ICD Dependent:  No      Episodes Coumadin:  No  Brady Parameters Mode VVI     Lower Rate Limit:  40      Tachy Zones VF:  240     VT:  200     Impression & Recommendations:  Problem # 1:  VENTRICULAR  FIBRILLATION (ICD-427.41) the patient has had recurrent polymorphic ventricular tachycardia. This occurred in the setting of sinus tachycardia. We'll plan to increase his carvedilol to 12.5-25 twice daily. He'll follow up with Dr. Jens Som. He is advised not to drive.  laboratories from his presentation in early February included magnesium of 2.4 and a potassium of 4.1 His updated medication list for this problem includes:    Adult Aspirin Ec Low Strength 81 Mg Tbec (Aspirin) .[name]... Take one by mouth once daily    Carvedilol 25 Mg Tabs (Carvedilol) .[name]... 1 two times a day    Lisinopril 20 Mg Tabs (Lisinopril) .[name]... Take one tablet by mouth daily  Problem # 2:  IMPLANTABLE B DEFIBRILLATOR (ICD-V45.02) Device parameters and data were reviewed and no changes were made  Problem # 3:  CARDIOMYOPATHY, ISCHEMIC (ICD-414.8) stable His updated medication list for this problem includes:    Adult Aspirin Ec Low Strength  81 Mg Tbec (Aspirin) .[name]... Take one by mouth once daily    Carvedilol 25 Mg Tabs (Carvedilol) .[name]... 1 two times a day    Furosemide 20 Mg Tabs (Furosemide) .[name]... Take one tablet by mouth daily.    Lisinopril 20 Mg Tabs (Lisinopril) .[name]... Take one tablet by mouth daily  Patient Instructions: 1)  Your physician recommends that you schedule a follow-up appointment in: NEXT AVAILABLE WITH DR CRENSHAW 2)  Your physician has recommended you make the following change in your medication: INCREASE CARVEDILOL TO 25 MG two times a day  Prescriptions: CARVEDILOL 25 MG TABS (CARVEDILOL) 1 two times a day  #60 x 11   Entered by:   Scherrie Bateman, LPN   Authorized by:   Nathen May, MD, [facility]   Signed by:   Scherrie Bateman, LPN on 16/01/9603   Method used:   Electronically to        [facility] [phone]* (retail)       [address] [place], [place]  [address]       Ph: [phone] or [phone]       Fax: [phone]   RxID:   [id number]

## 2010-06-30 ENCOUNTER — Ambulatory Visit: Payer: Medicare Other | Admitting: Cardiology

## 2010-07-05 LAB — BASIC METABOLIC PANEL
BUN: 9 mg/dL (ref 6–23)
CO2: 27 mEq/L (ref 19–32)
Calcium: 8.4 mg/dL (ref 8.4–10.5)
Calcium: 8.4 mg/dL (ref 8.4–10.5)
Calcium: 8.7 mg/dL (ref 8.4–10.5)
Creatinine, Ser: 1.14 mg/dL (ref 0.4–1.5)
GFR calc Af Amer: >60 mL/min (ref >60)
GFR calc Af Amer: >60 mL/min (ref >60)
GFR calc non Af Amer: >60 mL/min (ref >60)
Glucose, Bld: 107 mg/dL — ABNORMAL HIGH (ref 70–99)
Sodium: 132 mEq/L — ABNORMAL LOW (ref 135–145)
Sodium: 137 mEq/L (ref 135–145)

## 2010-07-05 LAB — STOOL CULTURE

## 2010-07-05 LAB — CBC
HCT: 43.1 % (ref 39.0–52.0)
Hemoglobin: 11.1 g/dL — ABNORMAL LOW (ref 13.0–17.0)
Hemoglobin: 13.1 g/dL (ref 13.0–17.0)
MCHC: 33 g/dL (ref 30.0–36.0)
MCHC: 33.4 g/dL (ref 30.0–36.0)
MCV: 86 fL (ref 78.0–100.0)
Platelets: 267 10*3/uL (ref 150–400)
Platelets: 306 10*3/uL (ref 150–400)
RBC: 3.82 MIL/uL — ABNORMAL LOW (ref 4.22–5.81)
RBC: 3.87 MIL/uL — ABNORMAL LOW (ref 4.22–5.81)
RDW: 15.5 % (ref 11.5–15.5)
RDW: 15.8 % — ABNORMAL HIGH (ref 11.5–15.5)
WBC: 12.5 10*3/uL — ABNORMAL HIGH (ref 4.0–10.5)
WBC: 17.9 10*3/uL — ABNORMAL HIGH (ref 4.0–10.5)

## 2010-07-05 LAB — COMPREHENSIVE METABOLIC PANEL
AST: 31 U/L (ref 0–37)
Albumin: 3.7 g/dL (ref 3.5–5.2)
BUN: 10 mg/dL (ref 6–23)
Calcium: 9.6 mg/dL (ref 8.4–10.5)
Creatinine, Ser: 1.05 mg/dL (ref 0.4–1.5)
GFR calc Af Amer: >60 mL/min (ref >60)
Total Protein: 7.5 g/dL (ref 6.0–8.3)

## 2010-07-05 LAB — DIFFERENTIAL
Basophils Absolute: 0 10*3/uL (ref 0.0–0.1)
Basophils Relative: 0 % (ref 0–1)
Basophils Relative: 0 % (ref 0–1)
Eosinophils Relative: 0 % (ref 0–5)
Lymphocytes Relative: 5 % — ABNORMAL LOW (ref 12–46)
Lymphs Abs: 2.2 10*3/uL (ref 0.7–4.0)
Monocytes Relative: 7 % (ref 3–12)
Monocytes Relative: 9 % (ref 3–12)
Neutro Abs: 13.7 10*3/uL — ABNORMAL HIGH (ref 1.7–7.7)
Neutro Abs: 26.7 10*3/uL — ABNORMAL HIGH (ref 1.7–7.7)
Neutrophils Relative %: 77 % (ref 43–77)

## 2010-07-05 LAB — CLOSTRIDIUM DIFFICILE EIA

## 2010-07-05 LAB — URINALYSIS, ROUTINE W REFLEX MICROSCOPIC
Glucose, UA: NEGATIVE mg/dL
Ketones, ur: NEGATIVE mg/dL
Protein, ur: NEGATIVE mg/dL
Urobilinogen, UA: 0.2 mg/dL (ref 0.0–1.0)

## 2010-07-05 LAB — MAGNESIUM: Magnesium: 2.2 mg/dL (ref 1.5–2.5)

## 2010-07-05 LAB — OVA AND PARASITE EXAMINATION

## 2010-07-05 LAB — CULTURE, BLOOD (ROUTINE X 2): Culture: NO GROWTH

## 2010-07-06 LAB — CBC
HCT: 37 % — ABNORMAL LOW (ref 39.0–52.0)
HCT: 37.2 % — ABNORMAL LOW (ref 39.0–52.0)
HCT: 39.3 % (ref 39.0–52.0)
HCT: 56.8 % — ABNORMAL HIGH (ref 39.0–52.0)
Hemoglobin: 12.6 g/dL — ABNORMAL LOW (ref 13.0–17.0)
Hemoglobin: 13.5 g/dL (ref 13.0–17.0)
Hemoglobin: 13.6 g/dL (ref 13.0–17.0)
Hemoglobin: 16.9 g/dL (ref 13.0–17.0)
Hemoglobin: 19.1 g/dL — ABNORMAL HIGH (ref 13.0–17.0)
MCHC: 33.1 g/dL (ref 30.0–36.0)
MCHC: 34.2 g/dL (ref 30.0–36.0)
MCHC: 33.7 g/dL (ref 30.0–36.0)
MCHC: 34.2 g/dL (ref 30.0–36.0)
MCHC: 34.5 g/dL (ref 30.0–36.0)
MCV: 88.3 fL (ref 78.0–100.0)
MCV: 88.5 fL (ref 78.0–100.0)
MCV: 87.2 fL (ref 78.0–100.0)
MCV: 87.7 fL (ref 78.0–100.0)
Platelets: 418 10*3/uL — ABNORMAL HIGH (ref 150–400)
Platelets: 427 10*3/uL — ABNORMAL HIGH (ref 150–400)
Platelets: 468 10*3/uL — ABNORMAL HIGH (ref 150–400)
Platelets: 161 10*3/uL (ref 150–400)
Platelets: 188 10*3/uL (ref 150–400)
Platelets: 226 10*3/uL (ref 150–400)
Platelets: 292 10*3/uL (ref 150–400)
Platelets: UNDETERMINED 10*3/uL (ref 150–400)
RBC: 3.89 MIL/uL — ABNORMAL LOW (ref 4.22–5.81)
RBC: 4 MIL/uL — ABNORMAL LOW (ref 4.22–5.81)
RBC: 4.73 MIL/uL (ref 4.22–5.81)
RBC: 4.78 MIL/uL (ref 4.22–5.81)
RBC: 4.53 MIL/uL (ref 4.22–5.81)
RBC: 6.47 MIL/uL — ABNORMAL HIGH (ref 4.22–5.81)
RBC: 6.76 MIL/uL — ABNORMAL HIGH (ref 4.22–5.81)
RDW: 14.3 % (ref 11.5–15.5)
RDW: 14.7 % (ref 11.5–15.5)
RDW: 14.6 % (ref 11.5–15.5)
RDW: 14.9 % (ref 11.5–15.5)
RDW: 14.9 % (ref 11.5–15.5)
RDW: 15.1 % (ref 11.5–15.5)
RDW: 15.5 % (ref 11.5–15.5)
RDW: 15.6 % — ABNORMAL HIGH (ref 11.5–15.5)
WBC: 14 10*3/uL — ABNORMAL HIGH (ref 4.0–10.5)
WBC: 14.3 10*3/uL — ABNORMAL HIGH (ref 4.0–10.5)
WBC: 16.3 10*3/uL — ABNORMAL HIGH (ref 4.0–10.5)
WBC: 11.8 10*3/uL — ABNORMAL HIGH (ref 4.0–10.5)
WBC: 13.1 10*3/uL — ABNORMAL HIGH (ref 4.0–10.5)
WBC: 18.3 10*3/uL — ABNORMAL HIGH (ref 4.0–10.5)

## 2010-07-06 LAB — POCT I-STAT 3, ART BLOOD GAS (G3+)
Acid-base deficit: 12 mmol/L — ABNORMAL HIGH (ref 0.0–2.0)
Acid-base deficit: 19 mmol/L — ABNORMAL HIGH (ref 0.0–2.0)
Patient temperature: 99.1
pCO2 arterial: 46.6 mmHg — ABNORMAL HIGH (ref 35.0–45.0)
pO2, Arterial: 147 mmHg — ABNORMAL HIGH (ref 80.0–100.0)
pO2, Arterial: 281 mmHg — ABNORMAL HIGH (ref 80.0–100.0)

## 2010-07-06 LAB — GLUCOSE, CAPILLARY
Glucose-Capillary: 100 mg/dL — ABNORMAL HIGH (ref 70–99)
Glucose-Capillary: 101 mg/dL — ABNORMAL HIGH (ref 70–99)
Glucose-Capillary: 102 mg/dL — ABNORMAL HIGH (ref 70–99)
Glucose-Capillary: 105 mg/dL — ABNORMAL HIGH (ref 70–99)
Glucose-Capillary: 106 mg/dL — ABNORMAL HIGH (ref 70–99)
Glucose-Capillary: 106 mg/dL — ABNORMAL HIGH (ref 70–99)
Glucose-Capillary: 108 mg/dL — ABNORMAL HIGH (ref 70–99)
Glucose-Capillary: 109 mg/dL — ABNORMAL HIGH (ref 70–99)
Glucose-Capillary: 110 mg/dL — ABNORMAL HIGH (ref 70–99)
Glucose-Capillary: 110 mg/dL — ABNORMAL HIGH (ref 70–99)
Glucose-Capillary: 121 mg/dL — ABNORMAL HIGH (ref 70–99)
Glucose-Capillary: 122 mg/dL — ABNORMAL HIGH (ref 70–99)
Glucose-Capillary: 123 mg/dL — ABNORMAL HIGH (ref 70–99)
Glucose-Capillary: 126 mg/dL — ABNORMAL HIGH (ref 70–99)
Glucose-Capillary: 229 mg/dL — ABNORMAL HIGH (ref 70–99)
Glucose-Capillary: 75 mg/dL (ref 70–99)
Glucose-Capillary: 78 mg/dL (ref 70–99)
Glucose-Capillary: 81 mg/dL (ref 70–99)
Glucose-Capillary: 83 mg/dL (ref 70–99)
Glucose-Capillary: 85 mg/dL (ref 70–99)
Glucose-Capillary: 86 mg/dL (ref 70–99)
Glucose-Capillary: 86 mg/dL (ref 70–99)
Glucose-Capillary: 88 mg/dL (ref 70–99)
Glucose-Capillary: 90 mg/dL (ref 70–99)
Glucose-Capillary: 91 mg/dL (ref 70–99)
Glucose-Capillary: 95 mg/dL (ref 70–99)
Glucose-Capillary: 99 mg/dL (ref 70–99)

## 2010-07-06 LAB — BASIC METABOLIC PANEL
BUN: 6 mg/dL (ref 6–23)
BUN: 11 mg/dL (ref 6–23)
BUN: 20 mg/dL (ref 6–23)
BUN: 21 mg/dL (ref 6–23)
BUN: 22 mg/dL (ref 6–23)
BUN: 23 mg/dL (ref 6–23)
BUN: 23 mg/dL (ref 6–23)
BUN: 23 mg/dL (ref 6–23)
BUN: 9 mg/dL (ref 6–23)
CO2: 10 mEq/L — ABNORMAL LOW (ref 19–32)
CO2: 16 mEq/L — ABNORMAL LOW (ref 19–32)
CO2: 18 mEq/L — ABNORMAL LOW (ref 19–32)
CO2: 19 mEq/L (ref 19–32)
CO2: 19 mEq/L (ref 19–32)
CO2: 21 mEq/L (ref 19–32)
CO2: 21 mEq/L (ref 19–32)
CO2: 22 mEq/L (ref 19–32)
Calcium: 8.6 mg/dL (ref 8.4–10.5)
Calcium: 6.4 mg/dL — CL (ref 8.4–10.5)
Calcium: 7.1 mg/dL — ABNORMAL LOW (ref 8.4–10.5)
Calcium: 7.2 mg/dL — ABNORMAL LOW (ref 8.4–10.5)
Calcium: 7.4 mg/dL — ABNORMAL LOW (ref 8.4–10.5)
Calcium: 7.8 mg/dL — ABNORMAL LOW (ref 8.4–10.5)
Calcium: 8 mg/dL — ABNORMAL LOW (ref 8.4–10.5)
Calcium: 8.2 mg/dL — ABNORMAL LOW (ref 8.4–10.5)
Calcium: 8.9 mg/dL (ref 8.4–10.5)
Calcium: 8.9 mg/dL (ref 8.4–10.5)
Calcium: 8.9 mg/dL (ref 8.4–10.5)
Calcium: 9 mg/dL (ref 8.4–10.5)
Chloride: 109 mEq/L (ref 96–112)
Chloride: 109 mEq/L (ref 96–112)
Chloride: 111 mEq/L (ref 96–112)
Chloride: 113 mEq/L — ABNORMAL HIGH (ref 96–112)
Chloride: 113 mEq/L — ABNORMAL HIGH (ref 96–112)
Chloride: 114 mEq/L — ABNORMAL HIGH (ref 96–112)
Chloride: 115 mEq/L — ABNORMAL HIGH (ref 96–112)
Chloride: 119 mEq/L — ABNORMAL HIGH (ref 96–112)
Creatinine, Ser: 1.53 mg/dL — ABNORMAL HIGH (ref 0.4–1.5)
Creatinine, Ser: 1.54 mg/dL — ABNORMAL HIGH (ref 0.4–1.5)
Creatinine, Ser: 1.69 mg/dL — ABNORMAL HIGH (ref 0.4–1.5)
Creatinine, Ser: 1.76 mg/dL — ABNORMAL HIGH (ref 0.4–1.5)
Creatinine, Ser: 1.82 mg/dL — ABNORMAL HIGH (ref 0.4–1.5)
Creatinine, Ser: 1.83 mg/dL — ABNORMAL HIGH (ref 0.4–1.5)
Creatinine, Ser: 1.94 mg/dL — ABNORMAL HIGH (ref 0.4–1.5)
Creatinine, Ser: 1.95 mg/dL — ABNORMAL HIGH (ref 0.4–1.5)
GFR calc Af Amer: 46 mL/min — ABNORMAL LOW (ref >60)
GFR calc Af Amer: 46 mL/min — ABNORMAL LOW (ref >60)
GFR calc Af Amer: 47 mL/min — ABNORMAL LOW (ref >60)
GFR calc Af Amer: 48 mL/min — ABNORMAL LOW (ref >60)
GFR calc Af Amer: 49 mL/min — ABNORMAL LOW (ref >60)
GFR calc Af Amer: 50 mL/min — ABNORMAL LOW (ref >60)
GFR calc Af Amer: 56 mL/min — ABNORMAL LOW (ref >60)
GFR calc Af Amer: >60 mL/min (ref >60)
GFR calc non Af Amer: >60 mL/min (ref >60)
GFR calc non Af Amer: 38 mL/min — ABNORMAL LOW (ref >60)
GFR calc non Af Amer: 39 mL/min — ABNORMAL LOW (ref >60)
GFR calc non Af Amer: 39 mL/min — ABNORMAL LOW (ref >60)
GFR calc non Af Amer: 41 mL/min — ABNORMAL LOW (ref >60)
GFR calc non Af Amer: 45 mL/min — ABNORMAL LOW (ref >60)
GFR calc non Af Amer: 45 mL/min — ABNORMAL LOW (ref >60)
GFR calc non Af Amer: 46 mL/min — ABNORMAL LOW (ref >60)
GFR calc non Af Amer: 50 mL/min — ABNORMAL LOW (ref >60)
GFR calc non Af Amer: 51 mL/min — ABNORMAL LOW (ref >60)
Glucose, Bld: 84 mg/dL (ref 70–99)
Glucose, Bld: 116 mg/dL — ABNORMAL HIGH (ref 70–99)
Glucose, Bld: 180 mg/dL — ABNORMAL HIGH (ref 70–99)
Glucose, Bld: 235 mg/dL — ABNORMAL HIGH (ref 70–99)
Glucose, Bld: 83 mg/dL (ref 70–99)
Glucose, Bld: 92 mg/dL (ref 70–99)
Glucose, Bld: 92 mg/dL (ref 70–99)
Glucose, Bld: 98 mg/dL (ref 70–99)
Potassium: 3.2 mEq/L — ABNORMAL LOW (ref 3.5–5.1)
Potassium: 3.3 mEq/L — ABNORMAL LOW (ref 3.5–5.1)
Potassium: 3.5 mEq/L (ref 3.5–5.1)
Potassium: 3.5 mEq/L (ref 3.5–5.1)
Potassium: 3.9 mEq/L (ref 3.5–5.1)
Potassium: 4.8 mEq/L (ref 3.5–5.1)
Sodium: 138 mEq/L (ref 135–145)
Sodium: 139 mEq/L (ref 135–145)
Sodium: 141 mEq/L (ref 135–145)
Sodium: 143 mEq/L (ref 135–145)
Sodium: 146 mEq/L — ABNORMAL HIGH (ref 135–145)

## 2010-07-06 LAB — DIFFERENTIAL
Band Neutrophils: 0 % (ref 0–10)
Basophils Absolute: 0 10*3/uL (ref 0.0–0.1)
Basophils Absolute: 0.1 10*3/uL (ref 0.0–0.1)
Basophils Relative: 0 % (ref 0–1)
Blasts: 0 %
Eosinophils Absolute: 0.3 10*3/uL (ref 0.0–0.7)
Eosinophils Relative: 1 % (ref 0–5)
Eosinophils Relative: 2 % (ref 0–5)
Eosinophils Relative: 1 % (ref 0–5)
Lymphocytes Relative: 15 % (ref 12–46)
Lymphocytes Relative: 9 % — ABNORMAL LOW (ref 12–46)
Lymphocytes Relative: 80 % — ABNORMAL HIGH (ref 12–46)
Lymphs Abs: 10.5 10*3/uL — ABNORMAL HIGH (ref 0.7–4.0)
Monocytes Absolute: 2.3 10*3/uL — ABNORMAL HIGH (ref 0.1–1.0)
Monocytes Absolute: 0.5 10*3/uL (ref 0.1–1.0)
Monocytes Relative: 5 % (ref 3–12)
Monocytes Relative: 2 % — ABNORMAL LOW (ref 3–12)
Neutro Abs: 24.1 10*3/uL — ABNORMAL HIGH (ref 1.7–7.7)
Neutro Abs: 2.1 10*3/uL (ref 1.7–7.7)
Neutrophils Relative %: 16 % — ABNORMAL LOW (ref 43–77)
Neutrophils Relative %: 80 % — ABNORMAL HIGH (ref 43–77)
Promyelocytes Absolute: 0 %
nRBC: 0 /100 WBC

## 2010-07-06 LAB — COMPREHENSIVE METABOLIC PANEL
ALT: 42 U/L (ref 0–53)
ALT: 244 U/L — ABNORMAL HIGH (ref 0–53)
AST: 27 U/L (ref 0–37)
AST: 32 U/L (ref 0–37)
Albumin: 2.8 g/dL — ABNORMAL LOW (ref 3.5–5.2)
Alkaline Phosphatase: 71 U/L (ref 39–117)
Alkaline Phosphatase: 52 U/L (ref 39–117)
BUN: 18 mg/dL (ref 6–23)
CO2: 22 mEq/L (ref 19–32)
Calcium: 9.5 mg/dL (ref 8.4–10.5)
Calcium: 6.8 mg/dL — ABNORMAL LOW (ref 8.4–10.5)
Chloride: 98 mEq/L (ref 96–112)
Chloride: 117 mEq/L — ABNORMAL HIGH (ref 96–112)
Creatinine, Ser: 1.24 mg/dL (ref 0.4–1.5)
Creatinine, Ser: 1.75 mg/dL — ABNORMAL HIGH (ref 0.4–1.5)
GFR calc Af Amer: >60 mL/min (ref >60)
GFR calc Af Amer: >60 mL/min (ref >60)
GFR calc Af Amer: 52 mL/min — ABNORMAL LOW (ref >60)
GFR calc non Af Amer: >60 mL/min (ref >60)
GFR calc non Af Amer: 33 mL/min — ABNORMAL LOW (ref >60)
Glucose, Bld: 115 mg/dL — ABNORMAL HIGH (ref 70–99)
Glucose, Bld: 228 mg/dL — ABNORMAL HIGH (ref 70–99)
Potassium: 3.4 mEq/L — ABNORMAL LOW (ref 3.5–5.1)
Potassium: 3.3 mEq/L — ABNORMAL LOW (ref 3.5–5.1)
Sodium: 131 mEq/L — ABNORMAL LOW (ref 135–145)
Sodium: 136 mEq/L (ref 135–145)
Sodium: 135 mEq/L (ref 135–145)
Total Bilirubin: 0.8 mg/dL (ref 0.3–1.2)
Total Bilirubin: 1 mg/dL (ref 0.3–1.2)
Total Protein: 7.5 g/dL (ref 6.0–8.3)
Total Protein: 6.5 g/dL (ref 6.0–8.3)

## 2010-07-06 LAB — CLOSTRIDIUM DIFFICILE EIA

## 2010-07-06 LAB — BLOOD GAS, ARTERIAL
Acid-base deficit: 10.6 mmol/L — ABNORMAL HIGH (ref 0.0–2.0)
Acid-base deficit: 6.8 mmol/L — ABNORMAL HIGH (ref 0.0–2.0)
Bicarbonate: 18 mEq/L — ABNORMAL LOW (ref 20.0–24.0)
Bicarbonate: 21.3 mEq/L (ref 20.0–24.0)
Drawn by: 229971
FIO2: 0.3 %
FIO2: 0.5 %
FIO2: 0.5 %
MECHVT: 600 mL
MECHVT: 600 mL
O2 Content: 2 L/min
O2 Saturation: 98.6 %
O2 Saturation: 98.9 %
PEEP: 5 cmH2O
Patient temperature: 98.6
RATE: 18 resp/min
RATE: 18 resp/min
TCO2: 19.1 mmol/L (ref 0–100)
TCO2: 22.2 mmol/L (ref 0–100)
pCO2 arterial: 26.1 mmHg — ABNORMAL LOW (ref 35.0–45.0)
pCO2 arterial: 35 mmHg (ref 35.0–45.0)
pH, Arterial: 7.514 — ABNORMAL HIGH (ref 7.350–7.450)
pH, Arterial: 7.331 — ABNORMAL LOW (ref 7.350–7.450)
pO2, Arterial: 121 mmHg — ABNORMAL HIGH (ref 80.0–100.0)
pO2, Arterial: 103 mmHg — ABNORMAL HIGH (ref 80.0–100.0)
pO2, Arterial: 142 mmHg — ABNORMAL HIGH (ref 80.0–100.0)

## 2010-07-06 LAB — URINALYSIS, ROUTINE W REFLEX MICROSCOPIC
Bilirubin Urine: NEGATIVE
Glucose, UA: NEGATIVE mg/dL
Glucose, UA: 100 mg/dL — AB
Hgb urine dipstick: NEGATIVE
Hgb urine dipstick: NEGATIVE
Leukocytes, UA: NEGATIVE
Nitrite: NEGATIVE
Protein, ur: NEGATIVE mg/dL
Protein, ur: NEGATIVE mg/dL
Protein, ur: 100 mg/dL — AB
Specific Gravity, Urine: 1.012 (ref 1.005–1.030)
Urobilinogen, UA: 0.2 mg/dL (ref 0.0–1.0)
pH: 5.5 (ref 5.0–8.0)
pH: 5 (ref 5.0–8.0)

## 2010-07-06 LAB — POCT I-STAT, CHEM 8
BUN: 11 mg/dL (ref 6–23)
Calcium, Ion: 1.04 mmol/L — ABNORMAL LOW (ref 1.12–1.32)
Calcium, Ion: 0.91 mmol/L — ABNORMAL LOW (ref 1.12–1.32)
Chloride: 110 mEq/L (ref 96–112)
HCT: 34 % — ABNORMAL LOW (ref 39.0–52.0)
HCT: 43 % (ref 39.0–52.0)
Hemoglobin: 14.6 g/dL (ref 13.0–17.0)
Potassium: 5.1 mEq/L (ref 3.5–5.1)
Sodium: 138 mEq/L (ref 135–145)
TCO2: 23 mmol/L (ref 0–100)
TCO2: 9 mmol/L (ref 0–100)

## 2010-07-06 LAB — CULTURE, BLOOD (ROUTINE X 2)
Culture: NO GROWTH
Culture: NO GROWTH

## 2010-07-06 LAB — POCT CARDIAC MARKERS
CKMB, poc: 6.1 ng/mL (ref 1.0–8.0)
Myoglobin, poc: 288 ng/mL (ref 12–200)
Troponin i, poc: <0.05 ng/mL (ref 0.00–0.09)
Troponin i, poc: <0.05 ng/mL (ref 0.00–0.09)

## 2010-07-06 LAB — URINE CULTURE
Colony Count: NO GROWTH
Culture: NO GROWTH
Culture: NO GROWTH

## 2010-07-06 LAB — PROTIME-INR
INR: 1.18 (ref 0.00–1.49)
INR: 1.59 — ABNORMAL HIGH (ref 0.00–1.49)
Prothrombin Time: 14.9 seconds (ref 11.6–15.2)

## 2010-07-06 LAB — CK TOTAL AND CKMB (NOT AT ARMC): Relative Index: 8.4 — ABNORMAL HIGH (ref 0.0–2.5)

## 2010-07-06 LAB — PHOSPHORUS
Phosphorus: 2.4 mg/dL (ref 2.3–4.6)
Phosphorus: 5.3 mg/dL — ABNORMAL HIGH (ref 2.3–4.6)

## 2010-07-06 LAB — RAPID URINE DRUG SCREEN, HOSP PERFORMED
Amphetamines: NOT DETECTED
Benzodiazepines: POSITIVE — AB
Cocaine: NOT DETECTED
Tetrahydrocannabinol: POSITIVE — AB

## 2010-07-06 LAB — LACTIC ACID, PLASMA
Lactic Acid, Venous: 1.3 mmol/L (ref 0.5–2.2)
Lactic Acid, Venous: 2.1 mmol/L (ref 0.5–2.2)

## 2010-07-06 LAB — APTT
aPTT: 35 seconds (ref 24–37)
aPTT: 44 seconds — ABNORMAL HIGH (ref 24–37)
aPTT: 51 seconds — ABNORMAL HIGH (ref 24–37)

## 2010-07-06 LAB — CULTURE, BAL-QUANTITATIVE W GRAM STAIN
Colony Count: 3000
Gram Stain: NONE SEEN

## 2010-07-06 LAB — STOOL CULTURE

## 2010-07-06 LAB — MAGNESIUM
Magnesium: 1.7 mg/dL (ref 1.5–2.5)
Magnesium: 1.9 mg/dL (ref 1.5–2.5)
Magnesium: 2.2 mg/dL (ref 1.5–2.5)

## 2010-07-06 LAB — URINE MICROSCOPIC-ADD ON

## 2010-07-06 LAB — TSH: TSH: 3.364 u[IU]/mL (ref 0.350–4.500)

## 2010-07-06 LAB — CARDIAC PANEL(CRET KIN+CKTOT+MB+TROPI)
CK, MB: 196.1 ng/mL — ABNORMAL HIGH (ref 0.3–4.0)
Total CK: 3106 U/L — ABNORMAL HIGH (ref 7–232)

## 2010-07-06 LAB — LIPASE, BLOOD: Lipase: 18 U/L (ref 11–59)

## 2010-07-06 LAB — CARBOXYHEMOGLOBIN: Methemoglobin: 1.2 % (ref 0.0–1.5)

## 2010-07-06 LAB — TROPONIN I: Troponin I: 19.32 ng/mL (ref 0.00–0.06)

## 2010-07-06 LAB — AMYLASE: Amylase: 117 U/L (ref 27–131)

## 2010-07-06 LAB — BRAIN NATRIURETIC PEPTIDE: Pro B Natriuretic peptide (BNP): 50 pg/mL (ref 0.0–100.0)

## 2010-07-14 ENCOUNTER — Telehealth: Payer: Self-pay | Admitting: Internal Medicine

## 2010-07-14 NOTE — Telephone Encounter (Signed)
Pt calling re device. Pt states its making nose and would like to talk to a nurse.

## 2010-07-14 NOTE — Telephone Encounter (Signed)
I spoke with family member who reports the home transmitter monitor has been flashing yellow lights. Pt defib had not gone off and pt is doing well. I talked with Amber and called pt back.  Instructed them to unplug transmitter then plug it back in and press the white button on top to reset it.  She states that it is working fine now.  They will call back if they have any further problems. Mylo Red RN

## 2010-08-16 NOTE — Assessment & Plan Note (Signed)
[facility]                            CARDIOLOGY OFFICE NOTE   Billy, [name]                       MRN:          [id number]  DATE:05/13/2007                            DOB:          [birth year]    Billy [name] is a 45 year old gentleman that I follow for coronary disease  status post coronary bypassing graft.  He also has peripheral vascular  disease and has had prior intervention to an occluded left superficial  femoral artery in February 2008.  Since I last saw him, he is doing  reasonable well.  He denies any dyspnea on exertion, orthopnea, PND,  pedal edema, palpitations, pre-syncope, syncope, or claudication.  He  does occasionally have pain in his chest.  This can occur with lying  down and occasionally if he exerts himself to a significant degree.  It  is unchanged in frequency and severity and has been somewhat chronic.   MEDICATIONS:  Include:  1. Aspirin 81 mg daily.  2. Multivitamin daily.  3. Lisinopril 40 mg daily.  4. Toprol 100 mg daily.  5. Zocor 40 mg daily.   PHYSICAL EXAM:  VITAL SIGNS:  Shows a blood pressure of 141/82, and his  pulse is 74.  Weight is 239 pounds.  HEENT:  Normal.  NECK:  Supple with no bruits.  CHEST:  Clear.  CARDIOVASCULAR:  Regular rate and rhythm.  ABDOMEN:  Shows no tenderness.  EXTREMITIES:  Show no edema.   Electrocardiogram shows a sinus rhythm at a rate of 76.  There is a  prior inferior infarct, but there are no ST changes noted.   DIAGNOSES:  1. Chest pain - Billy [name] is having some chest pain, which has been a      chronic issue for him.  We will plan to risk stratify with a stress      Myoview.  If this shows no ischemia, then we will continue with      medical therapy.  2. Coronary artery disease status post bypassing graft - He will      continue on his aspirin, ACE inhibitor, Statin, and beta blocker.      The 3.  Hypertension - His blood pressure is mildly elevated today,      but he is  out of his Toprol and this is not available at present.      We will therefore change to Lopressor 50 mg p.o. b.i.d., and this      should adequately control his blood pressure.  3. Hyperlipidemia - his recent lipids and liver showed his LDL was      119.  Will discontinue his Zocor and begin Lipitor 80 mg daily.  5.      We will check lipids and liver in 6 weeks, as well as a BMET.  We      will adjust as indicated.   We will see him back in approximately 6 months.  I discussed the  importance of diet and exercise.  He does not smoke.     Billy Frieze Jens Som, MD, [facility]  Electronically Signed    BSC/MedQ  DD: 05/13/2007  DT: 05/14/2007  Job #: [id number]   cc:   Dr. Ronne Binning

## 2010-08-16 NOTE — Cardiovascular Report (Signed)
NAMEMISSAEL, FERRARI NO.:  [id number]   MEDICAL RECORD NO.:  [id number]          PATIENT TYPE:  INP   LOCATION:  6524                         FACILITY:  [facility]   PHYSICIAN:  Veverly Fells. Excell Seltzer, MD  DATE OF BIRTH:  [birth year]   DATE OF PROCEDURE:  12/27/2007  DATE OF DISCHARGE:                            CARDIAC CATHETERIZATION   PROCEDURE:  PTCA and stenting of OM-1, Angio-Seal of the right femoral  artery.   INDICATIONS:  Mr. Berrocal is a 45 year old gentleman with ischemic  cardiomyopathy.  He underwent exercise Myoview stress testing yesterday  and had an exercise-induced ventricular tachycardia.  He also had an  area of inferolateral infarct with peri-infarct ischemia.  He was  referred for cardiac catheterization.  Dr. Antoine Poche performed his  diagnostic catheterization.  There is an ungrafted large OM branch that  has a severe 95% stenosis.  This stenosis was probably at the site of  the distal anastomosis of a graft that has occluded.  It has the  appearance of being tented.  In any event, this is now an ungrafted  vessel with a critical stenosis and we elected to proceed with PCI.   Angiomax was used for anticoagulation.  The patient was preloaded with  600 mg of Plavix.  He had received aspirin.  Once a therapeutic ACT was  achieved, a 6-French XB 2.5 cm guide catheter was inserted.  A Cougar  guidewire was passed easily beyond the area of critical stenosis.  The  lesion was predilated with a 2.5 x 15-mm apex balloon up to 8  atmospheres.  Following predilatation, I elected to stent the vessel  with a 3.0 x 16-mm Liberte stent.  The stent was carefully positioned  and deployed at 18 atmospheres.  The stent was well expanded.  Following  stenting, there was TIMI III flow.  There was a residual disease off the  proximal and distal edges of the stent, but this was unchanged from  previous.  The disease is clearly nonobstructive.  I elected to post-  dilate  the stent with a 3.5 x 15-mm Quantum Maverick balloon which was  carefully positioned and inflated to 16 atmospheres.  The patient  tolerated the entire procedure well and had no immediate complications.  Final angiography demonstrated an excellent angiographic result with a  well-expanded stent and TIMI III flow.  The guide catheter was removed  and an Angio-Seal device was used to close the femoral arteriotomy.   ASSESSMENT:  Unsuccessful percutaneous coronary intervention of the  first obtuse marginal branch of the left circumflex with a bare metal  stent.   PLAN:  Recommend aspirin and Plavix for 30 days.  I would recommend an  outpatient EP evaluation since Mr. Keys had exercise-induced VT.  An  ICD is somewhat of a borderline call because he did have a severe  stenosis that could cause ischemia.  However, he also has an ischemic  cardiomyopathy and is at risk for ventricular arrhythmias.  A bare metal  stent was used because he has had some hematochezia that has not been  worked up.  After  30 days, I would recommend discontinuation of Plavix.      Veverly Fells. Excell Seltzer, MD  Electronically Signed    MDC/MEDQ  D:  12/27/2007  T:  12/28/2007  Job:  [id number]   cc:   Madolyn Frieze. Jens Som, MD, [facility]

## 2010-08-16 NOTE — Assessment & Plan Note (Signed)
[facility]                            CARDIOLOGY OFFICE NOTE   JOZEF, EISENBEIS                       MRN:          [id number]  DATE:12/19/2007                            DOB:          Aug 27, 1965    Mr. Birkeland is a pleasant gentleman who has a history of coronary artery  disease, status post coronary artery bypassing graft in 2003.  He has  also had previous peripheral vascular intervention.  Since I last saw  him, he occasionally has a pain in his chest particularly when he lays  down.  It is in the left chest area and described as an aching  sensation.  It lasted approximately 2 minutes and resolved  spontaneously.  He occasionally also feels a pain in his left shoulder  area.  There is no associated nausea, vomiting, shortness of breath, or  diaphoresis.  He does not have exertional chest pain.  There is no  dyspnea on exertion, orthopnea, or PND.  He is not having claudication.   MEDICATIONS:  1. Aspirin 81 mg p.o. daily.  2. Multivitamin p.o. daily.  3. Lisinopril 40 mg p.o. daily.  4. Toprol 100 mg p.o. daily.  5. Lipitor 40 mg p.o. daily.   PHYSICAL EXAMINATION:  VITAL SIGNS:  Today shows a blood pressure of  122/80.  His pulse is 63.  He weighs 241 pounds.  HEENT:  Normal.  NECK:  Supple.  CHEST:  Clear.  CARDIOVASCULAR:  Regular rate and rhythm.  ABDOMEN:  No tenderness.  EXTREMITIES:  No edema.   His electrocardiogram shows sinus rhythm at a rate of 63.  There were no  ST changes noted.   DIAGNOSES:  1. Chest pain - Symptoms are somewhat atypical.  However, it has been      6 years since his bypass.  We will proceed with a stress Myoview      for risk stratification.  2. Coronary artery disease, status post coronary artery bypassing      graft - He will continue on his aspirin, ACE inhibitor, statin, and      beta-blocker.  3. Hypertension - His blood pressure is adequately controlled.  When      he returns for his  Myoview, we will check a BMET to follow his      potassium and renal function.  4. Hyperlipidemia - He will continue on his statin.  We will check      lipids and liver and adjust as indicated.  5. Recent hematochezia - The patient apparently has had significant      hematochezia and was scheduled to see a gastroenterologist, but he      is failed to do this.  I will arrange for him to see one of ours      here in [place].   We will see him back in 6 months.  Note, we discussed the importance of  diet and exercise.  He also continues to smoke occasionally.  I  discussed the importance of discontinuing this full between 3-10  minutes.       Madolyn Frieze  Jens Som, MD, [facility]  Electronically Signed    BSC/MedQ  DD: 12/19/2007  DT: 12/20/2007  Job #: [id number]

## 2010-08-16 NOTE — Discharge Summary (Signed)
Billy [name], RIPPLE NO.:  [id number]   MEDICAL RECORD NO.:  [id number]          PATIENT TYPE:  INP   LOCATION:  6524                         FACILITY:  [facility]   PHYSICIAN:  Bettey Mare. Lawrence, NPDATE OF BIRTH:  1966/01/28   DATE OF ADMISSION:  12/26/2007  DATE OF DISCHARGE:  12/28/2007                               DISCHARGE SUMMARY   PRIMARY CARDIOLOGIST:  Madolyn Frieze. Jens Som, MD, [facility]   ELECTROPHYSIOLOGIST:  Duke Salvia, MD, [facility]   PRIMARY CARE PHYSICIAN:  Not listed.   PROCEDURES PERFORMED DURING HOSPITALIZATION:  1. Cardiac catheterization.  1a.  Revealing severe 3-vessel coronary artery disease with 2/4 grafts  patent with mildly reduced ejection fraction with most significant  stenosis in the circumflex with 100% after obtuse marginal in a previous  stent.  1b.  Status post successful percutaneous coronary intervention of first  obtuse marginal with bare-metal stent.  1. Ventricular tachycardia on treadmill stress test.  2. Ischemic cardiomyopathy.  3a.  The patient will be evaluated by electrophysiologist for need for  implantable cardioverter-defibrillator  pacemaker secondary to ventricular tachycardia and ischemic  cardiomyopathy.  1. Hypertension.  2. Hyperlipidemia.  3. Coronary artery bypass grafting.   HOSPITAL COURSE:  This is a 45 year old African American male who was  admitted to [facility] secondary to abnormal stress Myoview in  the office revealing mild decrease activity at stress in the apical cath  with wide-complex tachycardia during treadmill stress test.  The patient  was admitted for cardiac catheterization for further evaluation of  coronary anatomy.  The patient did undergo cardiac catheterization per  Dr. Rollene Rotunda on December 27, 2007.  Please see Dr. Jenene Slicker  thorough cardiac catheterization note for more details.  The patient had  severe 3-vessel CAD with 2-4 grafts patent with mildly reduced EF.   The  patient subsequently underwent PCI of the OM1 using a bare-metal stent.  This is a [name] 3.0 x 16-mm [name] stent per Dr. Tonny Bollman.  The  patient tolerated the procedure well without evidence of bleeding  hematoma or signs of infection.   The patient was seen and examined by myself and Dr. Lewayne Bunting on day of  discharge.  The patient appears to be stable and has been up walking  around the halls without difficulty.   DISCHARGE LABORATORY DATA:  Magnesium 1.9, sodium 138, potassium 4.2,  chloride 105, CO2 of 27, BUN 7, creatinine 1.15, and glucose 92.  Hemoglobin of 15.1, hematocrit 44.4, white blood cells 9.9, and  platelets 230.  Chest x-ray revealing no acute abnormalities.  EKG  revealing sinus brady with T-wave inversion in AVR and flattening in AVL  telemetry revealing sinus bradycardia in the 40s and 60s.   VITAL SIGNS:  Blood pressure 116/57, heart rate 64, respirations 20,  temperature 97.9, and O2 sat 95% on room air.   DISCHARGE MEDICATIONS:  1. Plavix 75 mg one p.o. daily x30 days.  2. Aspirin 81 mg daily.  3. Multivitamin daily.  4. Lisinopril 40 mg daily.  5. Toprol 50 mg b.i.d.  6. Lipitor 40 mg at bedtime.  ALLERGIES:  No known drug allergies.   FOLLOWUP PLANS AND APPOINTMENT:  The patient is going to follow up with  Dr. Olga Millers.  Our office will call to make further arrangements  for appointment.  The patient is going to follow up with Dr. Sherryl Manges for evaluation of ICD pacemaker in the setting of ventricular  tachycardia during treadmill stress   The patient has been advised on post cardiac catheterization  instructions with particular emphasis on the right groin site for  evidence of bleeding hematoma or signs of infection.   The patient has been advised on new prescription of Plavix, for which he  is to take for 30 days.   TIME SPENT WITH THE PATIENT:  Signs of the patient is to include  physician time 35 minutes.       Bettey Mare. Lyman Bishop, NP     KML/MEDQ  D:  12/28/2007  T:  12/28/2007  Job:  [id number]

## 2010-08-16 NOTE — Assessment & Plan Note (Signed)
[facility]                            CARDIOLOGY OFFICE NOTE   Billy, [name]                       MRN:          [id number]  DATE:03/08/2007                            DOB:          [birth year]    PRIMARY CARE PHYSICIAN:  Dr. Billee Cashing.   REASON FOR PRESENTATION:  Evaluate patient with peripheral vascular  disease.   HISTORY OF PRESENT ILLNESS:  The patient is here for followup of his  peripheral vascular disease.  He was seen by Dr. Excell Seltzer and had PTA of  the left superficial femoral artery and stenting of the left superficial  femoral artery in February of 2008.  Prior to that he had had some  claudication.  He did very well with that procedure.  He has had no  further leg discomfort.  He does walk 2 or 3 times a week for exercise.  He does not have any chest discomfort, neck discomfort, arm discomfort,  activity-induced nausea and vomiting, excessive diaphoresis.  He has not  had any palpitations, presyncope or syncope.  He did have ABIs and left  lower extremity duplex which demonstrates things to be patent.  I do not  have the report of the ABIs yet.   PAST MEDICAL HISTORY:  1. Coronary artery disease (March 2003, coronary artery bypass graft      with a left internal mammary artery to the left anterior      descending, saphenous vein graft to the posterior descending      artery, saphenous vein graft to obtuse marginal, and saphenous vein      graft to ramus intermediate).  2. Peripheral vascular disease.  3. Hypertension.  4. Dyslipidemia.  5. Mildly reduced ejection fraction (36% by cardiolite in 2007, 40% to      50% by echo in 2008).   ALLERGIES:  None.   MEDICATIONS:  1. Aspirin 81 mg daily.  2. Multivitamin.  3. Lisinopril 40 mg daily.  4. Toprol 100 mg daily.  5. Zocor 40 mg daily.   REVIEW OF SYSTEMS:  As stated in the HPI and otherwise negative for  other systems.   PHYSICAL EXAMINATION:  Patient is in no  distress.  He is very pleasant.  Blood pressure 116/70, heart rate 72 and regular.  HEENT:  Eyelids unremarkable, pupils are equal, round, and reactive to  light, fundi not visualized.  Oral mucosa unremarkable.  NECK:  No jugular venous distension at 45 degrees, carotid upstroke  brisk and symmetrical.  No bruit.  No thyromegaly.  LYMPHATICS:  No adenopathy.  LUNGS:  Clear to auscultation bilaterally.  HEART:  PMI not displaced or sustained.  S1 and S2 are within normal  limits.  No S3, no S4.  No clicks, no rubs, no murmurs.  ABDOMEN:  Obese, positive bowel sounds, normal in frequency and pitch.  No bruits, no rebound, no guarding.  No midline pulsatile mass.  No  organomegaly.  SKIN:  No rashes, no nodules.  EXTREMITIES:  2+ pulses posterior tibialis and dorsalis pedis on the  left, 1+ dorsalis pedis and posterior tibialis on the  right, 2+ upper  pulses, no cyanosis, no clubbing, no edema.  NEURO:  Grossly intact.   ASSESSMENT AND PLAN:  1. Peripheral vascular disease:  The patient is doing very well with      respect to this.  His claudication was much improved with the      procedure.  He is working on aggressive risk reduction with Dr.      Jens Som.  2. Coronary disease:  He sees Dr. Jens Som in January.  He will      continue with aggressive risk reduction.  3. Dyslipidemia:  I reviewed his lipid profile with him.  His HDL is a      little bit low.  I encouraged more walking.  He will continue on      the regimen as listed.  4. Followup will be with Dr. Jens Som and with Dr. Excell Seltzer p.r.n.     Rollene Rotunda, MD, [facility]  Electronically Signed    JH/MedQ  DD: 03/08/2007  DT: 03/09/2007  Job #: [id number]   cc:   Lorelle Formosa, M.D.

## 2010-08-16 NOTE — Letter (Signed)
January 27, 2008    Madolyn Frieze. Jens Som, MD, [facility]  [address]  [address]  [place], [place] [address]   RE:  Billy, [name]  MRN:  [id number]  /  DOB:  1965/08/30   Dear Arlys John,   It was a pleasure to see Billy [name] at your request for  consideration of ICD implantation.   As you remember, his strips and I had met briefly after he had ugly  polymorphic ventricular tachycardia while on the treadmill.  He  underwent catheterization and PCI of the first OM, which was consistent  with the ischemia that was identified on the Myoview scan.  He is now  referred for consideration of ICD implantation.   He is doing quite well.  He is walking daily.  He has no complaints of  chest pain or shortness of breath.  He has had no recurrent  palpitations.  He has no syncope.  He has had no peripheral edema or  nocturnal dyspnea.   I should note also that he is status post bypass grafting, and that at  the time of his catheterization, his OM  graft was noted to be occluded  with a high-grade stenosis, which was then stented with a drug-eluting  stent.  The vein graft to his RCA was also occluded.  His ejection  fraction was about 40%.   His past medical history in addition to the above is notable for:  1. Hypertension.  2. Hyperlipidemia.  3. History of hematochezia.   SOCIAL HISTORY:  He is married.  He is here today with his wife.   His medications include Toprol 100, Lipitor, lisinopril, aspirin.   He has no known drug allergies.   His review of systems in addition to the above is notable for remote  smoking, obesity.   On examination, he is a middle-aged [place] American male appearing in  his stated age of 29.  His blood pressure was 122/76.  His pulse was 84.  His weight was 227, which is down 3 pounds in the last 2 weeks.  His  HEENT exam demonstrated no icterus or xanthomata.  His neck veins were  flat.  Carotids are brisk and full bilaterally without bruits.  The back  was  without kyphosis or scoliosis.  His lungs were clear.  Heart sounds  were regular without murmurs or gallops.  The abdomen was soft with  active bowel sounds and no hepatomegaly.  Femoral pulses were 2+.  Distal pulses were intact.  There is no clubbing, cyanosis, or edema.  The neurological exam was grossly normal.  His skin was warm and dry.   Electrocardiogram dated January 13, 2008, demonstrated sinus rhythm at  63 with intervals of 0.13/0.08/0.41.  There are suggestions of an old  inferior wall MI.   IMPRESSION:  1. Ischemic cardiomyopathy.      a.     Prior bypass grafting.      b.     Prior myocardial infarction.      c.     Recent stenting of a high-grade first obtuse marginal lesion       for polymorphic ventricular tachycardia associated with treadmill       testing.  2. Ejection fraction of 35-40%.   DISCUSSION:  Arlys John, Mr. Bublitz is now about 4 weeks post intervention  for his high-grade marginal occlusion, at which time his ejection  fraction was measured at 35-40%.  The question as relates to primary  prevention is to  what level will his left ventricular function recover  and related to DETERMOINE TRIAL  how much scar does he have in his  recovered left ventricle.  The studies have mostly included  patients  who were 3 months or so post-revascularization, so I have asked him to  come back in about 6 weeks, at which time we will undertake cardiac MR  for assessment of both of left ventricular systolic function as well as  scar burden.  I mentioned to him that that information from the MR may  be useful whether his EF is depressed or not.  I reviewed these things  extensively with him and his wife.   The other strategy might have been to undertake EP testing to see if in  the setting of nonsustained VT he had inducible sustained VT.  I  discussed this with the family as well, and in the end, we settled all  the aforementioned approach.   To the end, we will plan to  see him again in about 6 weeks following his  cardiac MR to see where we go from here.   Thanks for the consultation.     Sincerely,      Duke Salvia, MD, [facility]  Electronically Signed    SCK/MedQ  DD: 01/29/2008  DT: 01/29/2008  Job #: [id number]   CC:    Lorelle Formosa, M.D.

## 2010-08-16 NOTE — Cardiovascular Report (Signed)
NAME:  Billy [name], Billy [name] NO.:  [id number]   MEDICAL RECORD NO.:  [id number]          PATIENT TYPE:  INP   LOCATION:  6524                         FACILITY:  [facility]   PHYSICIAN:  Rollene Rotunda, MD, FACCDATE OF BIRTH:  25-Oct-1965   DATE OF PROCEDURE:  12/27/2007  DATE OF DISCHARGE:                            CARDIAC CATHETERIZATION   PRIMARY CARE Abriella Filkins:  Lorelle Formosa, MD   CARDIOLOGIST:  Madolyn Frieze. Jens Som, MD, [facility]   PROCEDURES:  1. Left heart catheterization.  2. Coronary arteriography.   INDICATIONS:  Evaluate the patient with an abnormal Cardiolite.  He had  previous CABG.  When he had a stress test earlier this week, he had  polymorphic ventricular tachycardia.   PROCEDURE NOTE:  Left heart catheterization was performed via the right  femoral artery.  The artery was cannulated using anterior wall puncture.  A #5-French arterial sheath was inserted via the modified Seldinger  technique.  Preformed Judkins and pigtail catheters were utilized.  The  patient tolerated the procedure well.   RESULTS:  Hemodynamics:  LV 128/10, AO 138/73.   Coronaries:  Left main was normal.  The LAD had ostial 25% stenosis.  There was a mid long 60% stenosis.  First diagonal was large and was  occluded proximally.  It was seen to fill via vein graft.  The  circumflex in the AV groove had proximal tandem 25% lesions.  It was  occluded after the obtuse marginal one in a previously stented area.  The first obtuse marginal was a large vessel with proximal 99% stenosis.  Second obtuse marginal had some slight collateral filling from left-to-  left collaterals.  The right coronary artery was not engaged.  It was  known to be occluded.  It has a high anterior takeoff.  I reviewed Dr.  Regino Schultze films.  There were some left to right collaterals demonstrating  the PDA and posterolateral.   Grafts:  LIMA to the LAD was widely patent.  Saphenous vein graft to  diagonal was  patent.  Saphenous vein graft to the obtuse marginal was  occluded.  The saphenous vein graft to the right coronary artery was  occluded.   Left ventriculogram:  Left ventriculogram was obtained in the RAO  projection.  The EF was about 40% with inferior hypokinesis, somewhat  difficult to assess secondary to ectopy.   CONCLUSION:  Severe 3-vessel coronary artery disease, 2/4 grafts were  patent.  There appears to be mildly reduced ejection fraction.   PLAN:  The patient will have percutaneous revascularization of the  circumflex by Dr. Excell Seltzer.  The ejection fraction should be reassessed  with echocardiography depending on these results, further treatment with  an ICD suggested.      Rollene Rotunda, MD, [facility]  Electronically Signed     JH/MEDQ  D:  12/27/2007  T:  12/28/2007  Job:  [id number]   cc:   Lorelle Formosa, M.D.  Madolyn Frieze Jens Som, MD, [facility]

## 2010-08-16 NOTE — H&P (Signed)
NAME:  Billy [name], Billy [name] NO.:  [id number]   MEDICAL RECORD NO.:  [id number]          PATIENT TYPE:  INP   LOCATION:  4708                         FACILITY:  [facility]   PHYSICIAN:  Madolyn Frieze. Jens Som, MD, FACCDATE OF BIRTH:  11/27/1965   DATE OF ADMISSION:  12/26/2007  DATE OF DISCHARGE:                              HISTORY & PHYSICAL   Billy [name] is a pleasant 45 year old male patient of mine with a past  medical history of coronary artery disease, status post coronary artery  bypassing artery bypassing graft, ischemic cardiomyopathy, hypertension,  hyperlipidemia, peripheral vascular disease who has been admitted with  an abnormal Myoview and polymorphic ventricular tachycardia.  The  patient's cardiac history dates back to 2003.  At that time, he  underwent coronary artery bypassing graft.  At that time, he had a LIMA  to the LAD, saphenous vein graft to the posterior descending artery,  saphenous vein graft to the obtuse marginal branch, and saphenous vein  graft to the ramus.  He also has peripheral vascular disease and has had  PTA of his left superficial femoral artery.  He has also had stenting.  The patient was recently seen in the office by myself on December 19, 2006.  At that time, he was having atypical chest pain.  We did schedule  him to have a Myoview, which was performed on December 25, 2007.  His  ejection fraction was 35%.  There was a prior inferolateral infarct and  there was at least moderate peri-infarct ischemia.  Also the patient was  noted to have nonsustained polymorphic ventricular tachycardia for 6-7  seconds.  He was reviewed by Dr. Myrtis Ser in the office yesterday, but  declined admission.  I did contact to the patient today and he agreed to  be admitted.  Note, he denies any chest pain, shortness of breath,  palpitations, or syncope.   His medications include,  1. Aspirin 81 mg p.o. daily.  2. Multivitamin daily.  3. Lisinopril 40 mg  p.o. daily.  4. Toprol 100 mg p.o. daily.  5. Lipitor 40 mg p.o. daily.   He has no known drug allergies.   SOCIAL HISTORY:  He does smoke.  He does not consume alcohol.  He is  married with 3 daughters.   His family history is strongly positive for coronary artery disease.   His past medical history is significant for hypertension and  hyperlipidemia.  There is no diabetes mellitus.  He does have a history  of coronary artery disease as outlined in the HPI and has an ischemic  cardiomyopathy.  He also has peripheral vascular disease as described  above.  He has had some recent mild hematochezia and is scheduled for an  appointment to see Dr. Leone Payor later.   REVIEW OF SYSTEMS:  He denies any headaches, fevers, or chills.  There  is no productive cough or hemoptysis.  There is no dysphagia,  odynophagia, melena, or recent hematochezia.  There is no dysuria or  hematuria.  There is no rash or seizure activity.  There is orthopnea,  PND, or pedal edema.  He did not have any claudication.  The remaining  systems are negative.   PHYSICAL EXAM:  VITAL SIGNS:  Blood pressure of 121/81.  His pulse is  74.  His temperature is 98.6.  He is 96% on room air.  GENERAL:  He is well developed and well nourished in no acute distress.  SKIN:  Warm and dry.  He does not appear depressed.  There is no  peripheral clubbing.  BACK:  Normal.  HEENT:  Normal with normal eyelids.  NECK:  Supple with normal upstrokes bilaterally.  No bruits noted.  There is no jugular venous distention and I cannot appreciate  thyromegaly.  CHEST:  Clear to auscultation, no expansion.  CARDIOVASCULAR:  Regular rate and rhythm with normal S1 and S2.  There  are no murmurs, rubs, or gallops noted.  ABDOMEN:  Nontender, nondistended.  Positive sounds.  No  hepatosplenomegaly and no mass appreciated.  There is no abdominal  bruit.  He has 2+ femoral pulses bilaterally.  No bruits.  EXTREMITIES:  No edema that I can  palpate.  No cords.  He has 2+  dorsalis pedis pulses bilaterally.  NEUROLOGIC:  Grossly intact.   DIAGNOSES:  1. Abnormal Myoview - his Myoview demonstrates an ejection fraction of      35%, prior inferolateral infarct and there is moderate peri-infarct      ischemia.  He also had nonsustained polymorphic ventricular      tachycardia that was extremely concerning.  We will plan to proceed      with cardiac catheterization tomorrow morning.  The risk and      benefits have been discussed and he agrees to proceed.  2. Polymorphic ventricular tachycardia - given his reduced left      ventricular function, I think that regardless he will most likely      require an implantable cardioverter-defibrillator.  We will ask the      electrophysiologist to see him tomorrow morning after his cardiac      catheterization for full review.  3. Coronary artery disease, status post coronary artery bypassing      graft - he will continue on his aspirin, beta-blocker, ACE      inhibitor, and statin.  4. Ischemic cardiomyopathy - as per number 3.  5. Hypertension - his blood pressure is adequately controlled on his      present medications.  6. Hyperlipidemia - he will continue on his Lipitor.  7. Peripheral vascular use.      Madolyn Frieze Jens Som, MD, [facility]  Electronically Signed     BSC/MEDQ  D:  12/26/2007  T:  12/26/2007  Job:  [id number]

## 2010-08-16 NOTE — Assessment & Plan Note (Signed)
[facility]                            CARDIOLOGY OFFICE NOTE   GARWOOD, WENTZELL                       MRN:          [id number]  DATE:12/25/2007                            DOB:          Aug 27, 1965    Billy [name] saw Dr. Jens Som recently for an office visit.  The patient  had some chest discomfort that seemed atypical and not similar to his  prior chest discomfort.  The patient has coronary disease and he is post  CABG in 2003.  He appeared to be stable and a stress Myoview scan was  arranged.  The patient's Toprol was held for 24 hours.  He exercised  today for 7 minutes 16 seconds.  Peak heart rate was 164, representing  92% predicted maximum heart rate.  The patient had scattered PVCs during  the study.  He did not have chest pain.  However, in recovery at 7  minutes there is an area of wide-complex tachycardia.  This is very fast  with a rate of approximately 300.  It does seem to start with a wide  complex beat.  At first I questioned whether this could be artifact.  However, in lead II the impression is given that there is a QRS complex.  This lasted for approximately 7-8 seconds.  We are not aware of any  particular symptoms.  The patient then stabilized.  Because of this  finding and because of some changes in the nuclear images, I was  contacted of the DOD and the patient was brought over to me for  evaluation.   Billy [name] tells me today that he has not had any significant chest  pain.  He was surprised by the findings today.   PAST MEDICAL HISTORY:  Allergies:  No known drug allergies.   Medications:  Aspirin 81, vitamins, lisinopril 40, Toprol XL question  either 50 or 100, and this is to be resumed immediately, and next  Lipitor 40.   Other medical problems:  See the list below.   REVIEW OF SYSTEMS:  His review of systems today is negative.   IMPRESSION:  1. History of coronary disease, post coronary artery bypass grafting    in the past.  2. Hypertension, controlled.  3. Hyperlipidemia, treated.  4. History of some hematochezia.  Dr. Ludwig Clarks note says that he was      scheduled to see a gastroenterologist but had not done this as of      December 19, 2007.  *5.  Abnormal stress Myoview scan today.  I failed to mention that the  patient's images reveal that he has mild decreased activity at stress in  the apical cap.  There is slight reversibility in this area.  In the  inferolateral wall there is definite ischemia in a moderately large area  with moderate ischemia.  His ejection fraction is 35%.  The episode  today of wide complex tachycardia and ischemia is extremely concerning.  Even more so is his reduced ejection fraction.  I spoke at length with  the patient and his family member in the room.  I  urged him to be  admitted to the hospital at this time for catheterization.  He  understands my explanation but he is not willing to go into the  hospital.  With this in mind, we are immediately putting him back on his  beta-blocker.  He is on an aspirin and his statin.  He will go about  limited activities.  He does not do any type of heavy work.  In fact, he  is not working at this time.  If he has any significant symptoms, he  knows to come to the [facility] emergency room.  Otherwise, he will be seen at  the next office visit available for Dr. Jens Som.  Dr. Jens Som is his  doctor and he wants to review the entire situation with him.     Billy Abed, MD, [facility]  Electronically Signed    JDK/MedQ  DD: 12/25/2007  DT: 12/25/2007  Job #: [id number]   cc:   Madolyn Frieze. Jens Som, MD, [facility]

## 2010-08-16 NOTE — Assessment & Plan Note (Signed)
[facility]                            CARDIOLOGY OFFICE NOTE   ANUP, BRIGHAM                       MRN:          [id number]  DATE:01/13/2008                            DOB:          01/09/1966    HISTORY OF PRESENT ILLNESS:  Mr. Nalepa is a pleasant 45 year old  gentleman who I recently met at the [facility] after abnormal  Myoview.  He was found to have an ejection fraction of 35%.  There is a  prior inferolateral infarct with at least moderate peri-infarct  ischemia.  He also had significant nonsustained polymorphic ventricular  tachycardia for 6-7 seconds.  The patient subsequently underwent cardiac  catheterization.  The patient had severe three-vessel coronary artery  disease with 2-4 grafts patent.  He ultimately underwent PCI of his OM1  using a bare-metal stent.  Note, Dr. Jenene Slicker note states that  ejection fraction was 40%.  However, it is difficult to assess.  Since  then he denies any dyspnea, chest pain, palpitations, or syncope.  There  is no pedal edema.   MEDICATIONS:  1. Aspirin 81 mg p.o. daily.  2. Multivitamin 1 p.o. daily.  3. Lisinopril 40 mg p.o. daily.  4. Lipitor 80 mg p.o. daily.  5. Plavix 75 mg p.o. daily.  6. Toprol 50 mg p.o. b.i.d.   PHYSICAL EXAMINATION:  VITAL SIGNS:  Shows a blood pressure of 118/70,  his pulse is 62.  He weighs 230 pounds.  HEENT:  Normal.  NECK:  Supple.  CHEST:  Clear.  CARDIOVASCULAR:  Regular rate and rhythm.  ABDOMEN:  Shows no tenderness.  EXTREMITIES:  Show no edema.   His electrocardiogram shows a sinus rhythm at a rate of 63.  There is a  prior anterior infarct.  There are no ST changes noted.   DIAGNOSES:  1. Coronary artery disease status post coronary artery bypassing graft      as well as recent percutaneous coronary intervention of the obtuse      marginal (coronary artery) with a bare-metal stent - He will      continue on his aspirin.  I will continue  his Plavix for 30 days      and then discontinue this.  He will also continue on his ACE      inhibitor, statin, and beta blocker.  2. Recent polymorphic ventricular tachycardia on his treadmill - The      patient is scheduled see Dr. Graciela Husbands on the January 23, 2008 for      consideration of implantable cardioverter-defibrillator.      Certainly, his ischemia has been corrected, but the polymorphic      ventricular tachycardia was extremely worrisome.  I have reviewed      this with Dr. Graciela Husbands previously, and he felt the patient will most      likely need an implantable cardioverter-defibrillator regardless of      the outcome of his catheterization.  Dr. Graciela Husbands will help me make      this decision.  3. Hyperlipidemia - He will continue on his statin.  4. Ischemic cardiomyopathy -  He will continue on his ACE inhibitor as      well as his beta-blocker.   We will see him back in approximately 6 months.  Also of note, the  patient has now discontinued his tobacco since the hospitalization.  I  encouraged him to continue this.     Madolyn Frieze Jens Som, MD, [facility]  Electronically Signed    BSC/MedQ  DD: 01/13/2008  DT: 01/14/2008  Job #: [id number]

## 2010-08-19 NOTE — Consult Note (Signed)
NAME:  Billy, [name] NO.:  [id number]   MEDICAL RECORD NO.:  [id number]          PATIENT TYPE:  EMS   LOCATION:  ED                           FACILITY:  [facility]   PHYSICIAN:  Willa Rough, M.D.     DATE OF BIRTH:  [birth year]   DATE OF CONSULTATION:  12/07/2004  DATE OF DISCHARGE:                                   CONSULTATION   PRIMARY CARE PHYSICIAN:  Charlies Constable, M.D.   PRIMARY CARE PHYSICIAN:  Lorelle Formosa, M.D.   PATIENT PROFILE:  A 45 year old African-American male with a prior history  of CABG at age 44 and who presents with unstable angina.   PROBLEMS:  1.  Unstable angina/coronary artery disease.      1.  February 2000 acute myocardial infarction with percutaneous coronary          intervention to circumflex at [facility].      2.  June 18, 2001, coronary artery bypass graft x4 by Dr. Donata Clay,          left internal mammary artery to left anterior descending coronary          artery, vein graft to ramus, vein graft to obtuse marginal, vein          graft to posterior descending artery.  2.  Hypertension.  3.  Hyperlipidemia.  4.  History of right-sided musculoskeletal chest pain.  5.  Remote tobacco abuse, 40 pack-year history, quit in 2005.  6.  Right-sided facial swelling.  7.  Preserved left ventricular function, ejection fraction 50% by      catheterization June 14, 2001.   HISTORY OF PRESENT ILLNESS:  A 45 year old African-American male with prior  history of CAD, status post CABG x4 in March 2003, who was previously  followed by Dr. Charlies Constable but has not been seen in three years.  Since  his CABG, he has had 5-6/10 exertional substernal chest burning associated  with shortness of breath and diaphoresis occurring approximately every other  day, lasting about five minutes, and relieved with rest or one sublingual  nitroglycerin.  He states that when he has a really bad episode he takes  oxycodone, which has been given to him by his  primary care physician, and  this also relieves the pain.  He takes oxycodone about once or twice a week.  Last night, December 06, 2004, while watching TV, he had 7/10 substernal  chest burning similar to previous but a little more severe and more similar  to what he had prior to his bypass surgery.  He took one sublingual  nitroglycerin with relief of discomfort after about 10-15 minutes.  An hour  or two later he lay down to go to bed and had recurrent 7/10 substernal  chest burning, which required another nitroglycerin with relief.  He was  then able to fall off to sleep, and he got up this morning and felt well  until he was walking his daughters out to the bus stop and prior to getting  there had recurrent 6/10 chest discomfort with diaphoresis and shortness of  breath.  At that point he decided to present to [facility] ED, where he  underwent ECG which showed T-wave inversions in lead III but no other acute  changes, and his pain was relieved with IV nitroglycerin at 3 mcg/min.  His  first set of cardiac markers were negative with an MB of less than 1.0 and a  troponin I of less than 0.05.  On IV nitroglycerin, the patient is currently  pain-free and after discussion, we have recommended admission; however, the  patient says he has a court appointment in the morning and is not willing to  be admitted and is willing to sign out AMA as well as willing to come back  for an outpatient catheterization at a later date.   ALLERGIES:  No known drug allergies.   CURRENT/HOME MEDICATIONS:  1.  Toprol XL 50 mg daily.  2.  Zocor 40 mg daily.  3.  Multivitamin daily.  4.  Aspirin 81 mg daily.  5.  Nitroglycerin 0.4 mg sublingual p.r.n. chest pain.  6.  Oxycodone unknown dose p.r.n. chest pain, taken one to two times per      week.   FAMILY HISTORY:  Mother is alive at age 92 with hypertension.  Father died  of MI at age 14.  He has three brothers and two sisters, one sister who has  CAD  and has had CABG.  Most of his siblings have hypertension.   SOCIAL HISTORY:  He lives in [place] with his wife.  He is currently  unemployed.  He has three daughters.  He previously smoked two packs a day  x20 years and quit in 2005.  He denies any alcohol or drugs.  He does not  routinely exercise, and he tries to follow a low-fat, low-salt diet.   REVIEW OF SYSTEMS:  Positive for right-sided facial swelling which has been  present for one week, associated with a hardened area above his right upper  lip.  He denies any unilateral weakness or changes in vision, speech or  swallowing.  Positive for chest pain, shortness of breath, dyspnea on  exertion.  All other systems reviewed and negative.   PHYSICAL EXAMINATION:  VITAL SIGNS:  Temperature 98.2, heart rate 62,  respirations 18, blood pressure 137/90, pulse oximetry is 100% on 2 L/min.  GENERAL:  A pleasant African-American male in no acute distress, awake,  alert and oriented x3.  NECK:  Obese with no bruits or JVD.  HEENT:  He has right-sided facial swelling with a hardened 3 x 1.5 cm knot  just above his right upper lip in the mandibular area, which is sore to  touch.  NEUROLOGIC:  Cranial nerves II-XII are grossly intact.  His strength is 4/5  in all extremities, and he has normal sensation throughout.  LUNGS:  Respirations regular and unlabored, clear to auscultation.  CARDIAC:  Regular, S1, S2, no S3, S4 or murmurs.  ABDOMEN:  Obese, soft, nontender, nondistended, bowel sounds present x4.  EXTREMITIES:  Warm, dry, no clubbing, cyanosis, or edema.  Dorsalis pedis  and posterior tibial pulses 2+ and equal bilaterally.   ACCESSORY CLINICAL FINDINGS:  Chest x-ray shows no acute processes.  ECG  shows sinus rhythm with T-wave inversion in lead III and otherwise no acute  changes.  Labs so far:  Sodium 140, potassium 4.0, chloride 105, CO2 28, BUN  7, creatinine 1.0, glucose 101.  CK-MB is less than 1.0.  Troponin I is  less than 0.05.  Calcium  is 9.0.   ASSESSMENT AND PLAN:  1.  Unstable angina/coronary artery disease.  Patient now with symptoms      similar to pre-coronary artery bypass graft symptoms, relieved with low-      dose IV nitroglycerin.  We have recommended admission and rule-out with      cardiac catheterization tomorrow; however, the patient states that he      has a court appointment in the morning that he cannot miss, and      therefore he is unwilling to be admitted.  We discussed the risks of      going home, and he is willing to sign out against medical advice.  He is      also willing to come back for an outpatient catheterization.  We have      called over to the catheterization lab, and he is scheduled for an      outpatient catheterization on Friday, September 8, at 7:30 a.m., where      he is to come to short stay.  In the meantime, will obtain a CBC,      PT/INR.  He already has a Chem-7 and chest x-ray.  We would like to      cycle another set of enzymes.  If his enzymes came back positive, we      would have to discuss this again with the patient and more strongly      suggest admission.  Will increase his Toprol to 100 mg daily and also      give him a prescription for nitroglycerin patch at 0.4 mg/hr.  2.  Hypertension.  Blood pressure is up today.  We will increase his Toprol      to 100 mg daily.  3.  Hyperlipidemia.  He is currently in statin therapy.  He will need      outpatient follow-up.  He is not currently fasting.  4.  Remote tobacco abuse.  He quit in 2005.  5.  Right-sided facial swelling.  The patient reports a one-week history of      right-sided facial swelling.  He does have a knot above his right upper      lip, which is somewhat sore, and he thinks that he even had some pus      come out of it during the week.  His neurologic exam is completely      normal, and I do not feel any adenopathy.  He has not had any fevers or      chills.  We will give him a  prescription for Keflex and recommend      primary care follow-up.      Ok Anis, NP    ______________________________  Willa Rough, M.D.    CRB/MEDQ  D:  12/07/2004  T:  12/07/2004  Job:  [id number]

## 2010-08-19 NOTE — Letter (Signed)
May 17, 2006    Billy [name]. Billy Som, MD, [facility]  [address]  [address]  [place],  [place] [address]   RE:  Billy [name], Billy [name]  MRN:  [id number]  /  DOB:  July 27, 1965   PERIPHERAL VASCULAR REFERRAL LETTER   Dear Billy [name]:   Thanks for allowing me to see Billy [name] in consultation for his  peripheral vascular disease as an outpatient on May 17, 2006.   As you know, Billy [name] is a very nice 45 year old African American  gentleman with a history of ischemic cardiomyopathy and prior bypass  surgery who presents for evaluation of left calf claudication. He  describes a one year history of pain in his left calf that occurs with  very low level walking. He developed symptoms with less than 50 feet of  walking. He reports prompt pain resolution when he stops and rests. His  symptoms have been slowly progressive over the past one year. He has no  symptoms in his right leg. He denies any history of ulcers or non-  healing wounds.   His past medical history is pertinent for the following:  1. Coronary artery disease, status post coronary bypass surgery at age      45.  2. Ischemic cardiomyopathy with most recent assessment of LVEF in      January of this year demonstrating a left ventricular ejection      fraction of 40% to 50% with hypokinesis of the mid distal septum      and basal mid-posterior wall.  3. Hypertension.  4. Dyslipidemia.  5. History of tobacco abuse.   His social history:  The patient used to work in [employer], but has not been able to based  on his medical problems. He quit smoking three years ago and does not  drink alcohol. He is married and lives locally in [place].   Family history is pertinent for coronary artery disease in multiple  family members. His mother has poor circulation in her legs.   Review of systems:  A complete 12-point review of systems was performed. There are no  pertinent positives to add to his history. He really has been doing  very  well other than his leg pain.   Medications:  1. Aspirin 81 mg daily.  2. Multivitamin daily.  3. Vytorin 10/40 mg daily.  4. Lisinopril 40 mg daily.  5. Toprol XL 100 mg daily.   Allergies:  No known drug allergies.   His physical examination:  The patient is alert and oriented. He is in no acute distress. Blood  pressure is 135/78, heart rate is 87, weight is 244 pounds. Respiratory  rate is 12.  HEENT: Is normal  NECK: Normal carotid upstrokes without bruits. Jugular venous pressure  is normal. No thyromegaly or thyroid nodules.  LUNGS:  Clear to auscultation bilaterally.  CARDIOVASCULAR: Apex is discrete and nondisplaced. The heart is regular  rate and rhythm without murmurs or gallops.  ABDOMEN: Soft, obese and nontender. No organomegaly. No abdominal  bruits.  BACK: There is no paraspinal tenderness or flank tenderness.  EXTREMITIES: No clubbing, cyanosis or edema. The left calf circumference  is smaller than the right. Pulses on the right including dorsalis pedis,  posterior tibial, popliteal and femoral are all 2+. On the left, the  femoral pulse is 2+ without a bruit. The popliteal pulse is not  palpable. I am unable to palpate a left dorsalis pedis or posterior  tibial pulse, but both are obtainable by  Doppler.   Lower arterial duplex scan from January 24th, demonstrated an ABI of 1.1  on the right and 0.63 on the left. The left mid SFA is noted to be  occluded. The occlusion measured over 11 cm long with reconstitution via  collaterals. The right duplex imaging demonstrate mild plaque in the  proximal SFA and popliteal artery. There was triphasic flow throughout  the right lower extremity vessels.   Assessment:  Billy [name] is a 45 year old gentleman with Fontaine Class 2B  claudication. He has marked lifestyle limitations secondary to his  claudication and his non-invasive studies are suggestive of severe  atherosclerotic disease involving the left  superficial femoral artery  with a total occlusion of that vessel. I have reviewed the potential  options with Billy [name] and have recommended angiography. With his  young age and significant limitation I really think he will be best  served by revascularization. I do not think that he would benefit much  from the addition of Pletal because he has symptoms with such low level  activity. He will be scheduled for an angiogram and we can review  further options at the time of his angiogram after his anatomy is better  defined. His potential revascularization options would either be  attempting to recannulize his SFA and treat with a long segment of stent  versus surgical bypass.   Billy [name], thanks again for the opportunity to see Billy [name]. I will be in  contact with you at the time of his angiogram.    Sincerely,      Veverly Fells. Excell Seltzer, MD  Electronically Signed    MDC/MedQ  DD: 05/17/2006  DT: 05/17/2006  Job #: [id number]

## 2010-08-19 NOTE — Assessment & Plan Note (Signed)
[facility]                              CARDIOLOGY OFFICE NOTE   YOLANDA, DOCKENDORF                       MRN:          [id number]  DATE:11/27/2005                            DOB:          27-Apr-1965    REASON FOR VISIT:  Mr. Gorr returns for followup today.  Since I last saw  him, he denies any dyspnea, chest pain, palpitations, or syncope.   MEDICATIONS:  1. Toprol-XL 50 mg p.o. daily.  2. Aspirin 81 mg p.o. daily.  3. Multivitamin one p.o. daily.  4. Vytorin 10/40 q.h.s.  5. Lisinopril 20 mg p.o. daily.   PHYSICAL EXAMINATION:  VITAL SIGNS:  Blood pressure 135/72, pulse 76.  NECK:  Supple.  CHEST:  Clear.  CARDIOVASCULAR:  Regular rate and rhythm.  EXTREMITIES:  No edema.   DIAGNOSES:  1. Ischemic cardiomyopathy.  2. Coronary artery disease status post coronary artery bypass graft.  3. Hypertension.  4. Hyperlipidemia.   PLAN:  Mr. Slabach is doing well from a symptomatic standpoint.  We will  increase his lisinopril to 40 mg p.o. daily.  He is returning for lipids in  approximately three to four weeks, and we will plan to check both his liver  and his renal function at that time as well.  I will see him back in four  weeks, and we will then begin to increase his Toprol-XL as tolerated by  pulse and blood pressure.  Note, we will plan an echocardiogram after his  medications are fully titrated, and if his ejection fraction is 35% or less,  then he will be referred for consideration of ICD.  His most recent lipids  were not optimal, and we added Vytorin to his regimen instead of Zocor.  We  also discussed the importance of exercise and diet.  He will see [place] back in  four weeks, as described above.                              Madolyn Frieze Jens Som, MD, [facility]    BSC/MedQ  DD:  11/27/2005  DT:  11/28/2005  Job #:  [id number]

## 2010-08-19 NOTE — Progress Notes (Signed)
[name]                        PERIPHERAL VASCULAR OFFICE NOTE   Billy [name]                       MRN:          [id number]  DATE:06/18/2006                            DOB:          [birth year]    HISTORY OF PRESENT ILLNESS:  Billy [name] was seen in follow up at the  [facility] on June 18, 2006.  He is a 45-year-  old gentleman who was initially seen in consultation for peripheral  vascular disease on February 15.  His noninvasive study suggested a left  SFA occlusion and he had significant left cath claudication occurred  with low level activity.  He underwent stenting of the left superficial  femoral artery on February 27 and a 7 x 150 mm self-expanding stent was  placed in the left superficial femoral artery with a good result.  Following the procedure, he has had near resolution of his symptoms.  He  still has some claudication symptoms involving the left calf, but his  symptoms occurred at a much higher level with mild activity.  He has no  resting complaints.  He has no other new complaints to report.  He has  been walking regularly with his wife.   Billy [name] underwent an ABI and lower extremity arterial physiologic  study prior to his appointment which demonstrated an ABI of 1.0 on the  right and 0.98 on the left.  He had brisk anterior and posterior tibial  wave forms that were at least biphasic bilaterally.  His previous ABI  was 0.6 on the left.   MEDICATIONS:  Unchanged and include:  1. Aspirin 81 mg daily.  2. Multivitamin daily.  3. Vytorin 10/40 mg daily.  4. Lisinopril 40 mg daily.  5. Toprol XL 100 mg daily.  6. Plavix 75 mg daily.   ALLERGIES:  NKDA.   PHYSICAL EXAMINATION:  GENERAL:  He is alert and oriented and in no  acute distress.  VITAL SIGNS:  Blood pressure 124/94, heart rate 96, respirations 12,  weight 241 pounds.  LIMITED EXAM:  Demonstrates 2+ posterior tibial and  dorsalis pedis  pulses on the left foot.  The right groin site was examined and is clear  with no hematoma, ecchymoses or bruits.  There are no other  abnormalities.   ASSESSMENT:  Mr.  [name] is currently stable with regards to his  peripheral arterial disease.  He has had an excellent initial response  to a self-expanding stent in his left superficial femoral artery to  treat a total occlusion.  His ABI's have normalized.  He is still having  some symptoms that are suggestive of claudication, but they have greatly  improved.  He will be set up for six month ABI's and Duplex study, and I  would like to see him back at clinic at that time.  He will continue  with his aggressive risk factor modification under the care of Dr.  Jens Som.  I would  favor that he continue on dual antiplatelet therapy with aspirin and  clopidogrel if he continues to tolerate them well together and is able  to  access the Plavix.     Veverly Fells. Excell Seltzer, MD  Electronically Signed    MDC/MedQ  DD: 06/18/2006  DT: 06/19/2006  Job #: [id number]   cc:   Madolyn Frieze. Jens Som, MD, [facility]

## 2010-08-19 NOTE — Letter (Signed)
June 14, 2006     RE:  DAMAIN, BROADUS  MRN:  [id number]  /  DOB:  01/22/1966   To Whom It May Concern:   This letter is concerning [name].  He is a patient of mine  in [place], [place] at [facility].  The patient has a history of  coronary disease and is status post coronary artery bypassing graft.  He  also has an ischemic cardiomyopathy with an ejection fraction of 36% -  40%.  He does have some dyspnea on exertion but typically does not have  chest pain with exertion.  Also note, the patient also has peripheral  vascular disease.  He was recently seen by Dr. Excell Seltzer of our practice  for his peripheral vascular disease and underwent successful stenting of  an occluded left superficial femoral artery.  He did have significant  claudication prior to the procedure.  He is being treated medically for  these medical conditions.  The patient has a moderate limitation in his  physical activities.  There is no significant limitations related to his  mental status.  Please feel free to contact me if you have any concerns  or questions about this issue.    Sincerely,      Madolyn Frieze. Jens Som, MD, [facility]  Electronically Signed    BSC/MedQ  DD: 06/14/2006  DT: 06/15/2006  Job #: [id number]

## 2010-08-19 NOTE — Assessment & Plan Note (Signed)
[facility]                              CARDIOLOGY OFFICE NOTE   Billy [name], Billy [name]                       MRN:          [id number]  DATE:01/05/2006                            DOB:          1966/03/25    Billy [name] is a pleasant gentleman with a history of an ischemic  cardiomyopathy and coronary artery disease. Since I last saw him, he is  doing well. There is no increased dyspnea, chest pain, palpitations or  syncope. He occasionally gets chest soreness.   MEDICATIONS:  1. Toprol 50 mg p.o. daily.  2. Aspirin 81 mg p.o. daily.  3. Multivitamin.  4. Vytorin 10/40 q.h.s.  5. Lisinopril 40 mg p.o. daily.   PHYSICAL EXAMINATION:  VITAL SIGNS:  His physical examination today shows a  blood pressure of 124/82 and a pulse of 68.  CHEST:  Clear.  CARDIOVASCULAR:  Reveals a regular rate and rhythm.  EXTREMITIES:  Show no edema.   His electrocardiogram today shows a sinus rhythm at a rate of 68. There is a  prior inferior infarct.   DIAGNOSES:  1. Coronary artery disease status post coronary artery bypass graft.  2. Ischemic cardiomyopathy.  3. Hypertension.  4. Hyperlipidemia.   PLAN:  Billy [name] is doing well from a symptomatic standpoint. I have asked  him to increase his Toprol to 100 mg p.o. daily. His recent lipids were not  good but he was not taking his Vytorin. I have stressed the importance of  being compliant with his medications. He has resumed his Vytorin at 10/40  and we will check lipids and liver in 6 weeks. I will also check a TSH and a  ferritin at that time. We will see him back in 3 months and most likely  repeat his echocardiogram at that time. If his ejection fraction is less  than or equal to 35% then he will be  referred to one of our electrophysiologists for consideration of an ICD. He  will continue with risk factor modification.            ______________________________  Madolyn Frieze Jens Som, MD, [facility]     BSC/MedQ  DD:  01/05/2006  DT:  01/08/2006  Job #:  [id number]

## 2010-08-19 NOTE — Cardiovascular Report (Signed)
NAME:  CLIFF, DAMIANI              ACCOUNT NO.:  [id number]   MEDICAL RECORD NO.:  [id number]          PATIENT TYPE:  AMB   LOCATION:  SDS                          FACILITY:  [facility]   PHYSICIAN:  Veverly Fells. Excell Seltzer, MD  DATE OF BIRTH:  Oct 10, 1965   DATE OF PROCEDURE:  05/30/2006  DATE OF DISCHARGE:                            CARDIAC CATHETERIZATION   PROCEDURES:  1. Suprarenal abdominal aortogram.  2. Distal abdominal aortogram.  3. Left femoral angiogram with distal runoff via a contralateral      approach.  4. PTA of the left superficial femoral artery.  5. Stenting of the left superficial femoral artery.  6. StarClose of the right femoral artery.   INDICATIONS FOR PROCEDURE:  Mr. Hanssen is a 45 year old after American  gentleman with known coronary artery disease who is status post bypass  surgery.  He presented for evaluation of intermittent claudication of  the left leg.  He has marked symptoms with minimal walking.  Noninvasive  studies demonstrated no significant obstruction in the right leg and a  totally occluded left SFA that reconstituted via collaterals.  His ABI  was approximately 0.6 on the left.  ABI at rest was approximately 0.6 on  the left.  He was subsequently brought in for angiography with potential  for PTA.   DESCRIPTION OF PROCEDURE:  Risks and indications of the procedure were  explained in detail to the patient and his wife.  Informed consent was  obtained.  The right groin was prepped, draped and anesthetized with 1%  lidocaine.  Using modified Seldinger technique, a 5-French sheath was  placed in the right femoral artery.  A pigtail catheter was inserted  into the abdominal aorta and a suprarenal aortogram was performed.  Following this, a distal aortic angiogram was performed evaluating the  distal aorta, iliacs and femorals.  Following nonselective angiography,  a LIMA catheter was used with a Glidewire to cross over to the  contralateral side.  An  end-hole catheter was brought into the left  external iliac artery and a angiogram of the left leg was performed.  Following angiography, I elected to proceed with intervention of the  totally occluded left SFA.  We gave 50 units/kg of intravenous heparin.  A 6-French Terumo Destination sheath was used to cross over from the  right side into the left iliac artery.  An angled Glidewire with a end-  hole catheter was used to cross the occlusion and reenter the distal  vessel.  The wire moved freely and the catheter was inserted over the  wire just beyond the point of reconstitution.  At that point, a distal  angiogram was performed through the end-hole catheter to assure we were  intraluminal.  There was normal three-vessel runoff demonstrated.  The  Glidewire was changed out for a Wholey wire and the lesion was dilated  with a 4 x 10 Powerflex balloon to 10 atmospheres.  After balloon  dilatation, there clearly was significant disease above the area of  occlusion.  I elected to treat this with a 7 x 150 cm Protege EverFlex  self-expanding stent.  That was deployed and post dilated with 7 x 8  OptaPro balloon at 6 atmospheres on two inflations.  Following post  dilatation, the stent appeared well expanded but there was residual high  grade disease just off the proximal portion of the stent.  I elected to  simply dilated with the same 4 x 10 Powerflex balloon and this was  dilated at 6 atmospheres for 60 seconds.  I wanted to avoid overlapping  stents if possible.  Finally a repeat angiogram with runoff was  performed that demonstrated mild residual disease at the proximal  portion of the stent, no more than 20%.  There area of stent was widely  patent with good three-vessel runoff bilaterally.   CONCLUSION:  successful stenting of an occluded left superficial femoral  artery.   RECOMMENDATIONS:  Recommend aspirin and clopidogrel for a minimum of 30  days and if tolerated, longer.  Will  repeat a lower extremity Duplex  study in 2 weeks for a new baseline and I would like to see Mr. Keelan  back following that.      Veverly Fells. Excell Seltzer, MD  Electronically Signed     MDC/MEDQ  D:  05/30/2006  T:  05/30/2006  Job:  [id number]   cc:   Madolyn Frieze. Jens Som, MD, [facility]

## 2010-08-19 NOTE — Consult Note (Signed)
[place]  Patient:    Billy [name], Billy [name] Visit Number: [id number] MRN: [id number]          Service Type: MED Location: [phone] Attending Physician:  Lenoria Farrier Dictated by:   Mikey Bussing, M.D. Proc. Date: 06/17/01 Admit Date:  06/13/2001   CC:         [facility]   Consultation Report  REASON FOR CONSULTATION: Severe three-vessel coronary artery disease, status post myocardial infarction, unstable angina.  PHYSICIAN REQUESTING CONSULTATION: Bruce R. Juanda Chance, M.D.  PRIMARY CARE PHYSICIAN: Dala Dock Alexis, [place].  CHIEF COMPLAINT: Chest pain.  HISTORY OF PRESENT ILLNESS: I was asked to see this 45 year old black male in consultation by Dr. Juanda Chance for evaluation of possible surgical coronary revascularization for recently diagnosed three-vessel coronary artery disease. The patient has had progressive exertional chest pain with minimal activity. He has had some nocturnal substernal pressure chest pain that has awakened him from sleep.  This usually is relieved by rest or nitroglycerin.  He has had dyspnea on exertion, decrease in exercise tolerance, and decreased energy.  He denies any palpitations, syncope, dizziness, or diaphoresis.  The pain is substernal, squeezing in nature, with radiation to the arm and into the jaw as well.  The patient has a past history of known coronary artery disease.  He presented in February 2000 with a posterolateral MI, circumflex was stented.  LAD at that time was apparently normal and his right coronary artery had a mild nonsignificant lesion.  He has had no cardiac follow-up since that myocardial infarction.  The patient was admitted and stabilized with heparin, nitroglycerin, and beta-blockers.  Enzymes were negative.  He underwent cardiac catheterization by Dr. Juanda Chance, which demonstrated occlusion of the distal circumflex vessel, with poor collateral  reconstitution of the distal vessel.  The right coronary was totally occluded, and the proximal LAD diagonal had an 80-90% stenosis.  The OM1 prior to the circumflex occlusion had a 90% stenosis.  Ejection fraction was 35%, and the left ventricular end diastolic pressure at cardiac catheterization was measured at 33 mm Hg. Because of the patients now severe two-vessel coronary disease he was felt to be a candidate for surgical coronary revascularization.  PAST MEDICAL HISTORY:  1. Coronary artery disease.  2. Mild hypertension.  PAST SURGICAL HISTORY: No past surgical procedures.  ALLERGIES: No known drug allergies.  CURRENT MEDICATIONS:  1. Aspirin 81 mg q.d.  2. Altace 2.5 mg q.d.  3. Lopressor 50 mg b.i.d.  4. Sublingual nitroglycerin p.r.n.  5. Vitamins.  SOCIAL HISTORY: The patient is a Corporate investment banker.  He is married and [place]  He has three children.  Smokes one pack of cigarettes daily.  He uses alcohol occasionally.  FAMILY HISTORY: Positive for hypertension and coronary artery disease.  His father died of myocardial infarction.  REVIEW OF SYSTEMS: The patient is right hand dominant.  He denies any bleeding diathesis.  He denies any significant blunt chest injury or rib fractures.  He denies constitutional symptoms of weight loss, night sweats, or fever.  He denies ENT symptoms of visual change or difficulty swallowing.  He denies pulmonary symptoms of productive cough, hemoptysis, TB, or recent upper respiratory infection.  He denies cardiac symptoms of congestive failure.  He denies blood per rectum, abdominal pain, jaundice, or hepatitis.  He denies urologic symptoms of nocturia, pyuria, or hematuria.  He denies vascular symptoms of DVT, claudication, TIA, or CVA.  PHYSICAL EXAMINATION:  VITAL SIGNS: Height 6 feet 1 inch.  Weight 230 pounds.  Blood pressure 130/70, heart rate 72 and regular, respirations 18 and  unlabored.  GENERAL APPEARANCE: Pleasant young black male in no distress.  He is accompanied by his wife in his hospital room.  He is on IV heparin at this time.  HEENT: Normocephalic.  Full EOMs.  Pharynx clear.  Dentition with adequate repair.  NECK: Supple without JVD, thyromegaly, mass, or carotid bruit.  Lymphatics reveal no palpable adenopathy of the cervical, supraclavicular, or axillary regions.  LUNGS: Clear.  CARDIAC: No rubs, gallops, or murmur.  Regular rate and rhythm.  ABDOMEN: Soft, nontender, without organomegaly, mass, or abdominal bruit.  EXTREMITIES: No clubbing, edema, or cyanosis.  No muscular deformities.  His right groin is well-healed without hematoma at the cardiac catheterization site.  RECTAL: Deferred.  VASCULAR: Pulses 2+ bilaterally in the radial, femoral, and pedal arteries. No venous insufficiency of the lower extremities.  SKIN: Without chronic rash or skin lesions.  NEUROLOGIC: Alert and oriented x3 with full motor function.  LABORATORY DATA: I reviewed his coronary arteriograms and discussed these with Dr. Juanda Chance, and agree with his interpretation of severe two-vessel disease with total distal circumflex, total right coronary artery occlusion, and high-grade stenosis of the LAD diagonal at its bifurcation.  His glucose is normal.  Hematocrit 45%.  Cardiac enzymes were negative.  Chest x-ray shows mild cardiomegaly, no active pulmonary disease.  IMPRESSION/PLAN: This is a 45 year old black male with unstable angina, severe two-vessel disease, and moderate reduction in left ventricular function following an old posterolateral myocardial infarction.  He would benefit from surgical coronary revascularization.  We will plan bypass graft to the left anterior descending, diagonal, circumflex, and posterior descending.  I have discussed the indications, benefits, and risks of the operation with the  patient and his wife.  They understand the  major aspects of the operation and the associated risks of myocardial infarction, cerebrovascular accident, bleeding, infection, and death.  He agrees to proceed with the operation as planned, and understands the alternatives to surgical therapy for treatment of his coronary artery disease.  Thank you very much for this consultation.Dictated by:   Mikey Bussing, M.D. Attending Physician:  Lenoria Farrier DD:  06/17/01 TD:  06/17/01 Job: [id number]

## 2010-08-19 NOTE — Op Note (Signed)
[place]. [facility]  Patient:    Billy [name], Billy [name] Visit Number: [id number] MRN: [id number]          Service Type: MED Location: [phone] Attending Physician:  Mikey Bussing Dictated by:   Mikey Bussing, M.D. Proc. Date: 06/18/01 Admit Date:  06/13/2001   CC:         CVTS office  [facility]   Operative Report  PREOPERATIVE DIAGNOSIS:  Class 4 unstable angina, status post posterolateral myocardial infarction, severe three-vessel coronary disease.  POSTOPERATIVE DIAGNOSIS:  Class 4 unstable angina, status post posterolateral myocardial infarction, severe three-vessel coronary disease.  OPERATION PERFORMED:  Coronary artery bypass grafting times four (left internal mammary artery to left anterior descending, saphenous vein graft to ramus intermediate, saphenous vein graft to the circumflex marginal, saphenous vein graft to posterior descending).  SURGEON:  Mikey Bussing, M.D.  ASSISTANT:  Areta Haber, P.A.  ANESTHESIA:  General.  ANESTHESIOLOGIST:  Dr. Judie Petit.  INDICATIONS FOR PROCEDURE:  The patient is a 45 year old black male with a history of prior posterolateral myocardial infarction when he occluded his circumflex in the year 2000.  He was treated with a circumflex stent.  He now returns with unstable angina and was admitted and ruled for MI.  Cardiac catheterization demonstrated severe three-vessel coronary disease and he was felt to be a surgical candidate for coronary revascularization.  Prior to surgery, I reviewed the patients cardiac catheterization with the patient and his wife.  I discussed the indications and expected benefits of coronary artery bypass surgery for treatment of his coronary artery disease.  I discussed with the patient the alternatives to surgical therapy for treatment of his coronary artery disease.  I reviewed the major aspects of the coronary artery bypass operation  with the patient and wife including the location of the surgical incisions, the choice of conduit for grafting, the use of general anesthesia and cardiopulmonary bypass and the expected postoperative hospital recovery.  I reviewed with the patient the risks to him of coronary artery bypass surgery including the risks of MI, CVA, bleeding, infection, and death. He understood these aspects of the procedure and agreed to proceed with the operation as planned under what I felt was an informed consent.  OPERATIVE FINDINGS:  The distal circumflex beyond the old stent was atretic and too small to graft.  There was posterolateral scarring of the ventricle. The saphenous vein was of average to below average quality and it was taken from the right lower leg.  It seemed to get smaller in the thigh.  The mammary artery was of adequate caliber and with good flow.  DESCRIPTION OF PROCEDURE:  The patient was brought to the operating room and placed supine on the operating table where general anesthesia was induced under invasive hemodynamic monitoring.  The chest, abdomen and legs were prepped with Betadine and draped as a sterile field.  A sternal incision was made as the saphenous vein was harvested from the right leg.  The internal mammary artery was harvested as a pedicle graft from its origin at the subclavian vessels.  Heparin was administered and the ACT was documented as being therapeutic.  The sternal retractor was placed.  The pericardium was opened.  Through pursestrings placed in the ascending aorta and right atrium, the patient was cannulated and placed on bypass.  The coronaries were identified for grafting.  The mammary artery and vein grafts were prepared for distal anastomosis.  A  cardioplegia cannula was placed and the patient was cooled to 28 degrees.  As the aortic crossclamp was applied, 500 cc of cold blood cardioplegia was delivered into the aortic root with immediate cardioplegic  arrest and septal temperature dropping to less than 12 degrees. Topical iced saline slush was used to augment myocardial preservation and a pericardial insulator pad was used to protect the left phrenic nerve.  The distal coronary anastomoses were then performed.  The first distal anastomosis was to the posterior descending.  This was totally occluded proximally.  A reversed saphenous vein was sewn end-to-side with running 7-0 Prolene with good flow through the graft.  The second distal anastomosis was to the ramus intermediate or high first diagonal.  This was a 1.5 mm vessel with proximal 90% stenosis.  A reversed saphenous vein was sewn end-to-side with running 7-0 Prolene with good flow through the graft.  The third distal anastomosis was to the first circumflex marginal.  This was a 1.7 mm vessel with proximal 90% stenosis.  A reversed saphenous vein was sewn end-to-side with running 7-0 Prolene with good flow through the graft.  Cardioplegia was redosed.  The fourth distal anastomosis was to the distal third of the LAD, where it became epicardial in location from its more proximal deep intramyocardial location.  The left internal mammary artery pedicle was brought through an opening, created in the left lateral pericardium, was brought down on the LAD and sewn end-to-side with running 8-0 Prolene.  There was excellent flow through the anastomosis with immediate rise in septal temperature after release of the pedicle clamp on the mammary artery.  The mammary pedicle was secured to the epicardium and the aortic crossclamp was removed.  A partial occlusion clamp was placed on the ascending aorta and three proximal vein anastomoses were performed using a 4.0 mm punch and running 6-0 Prolene. The partial clamp was removed and the vein grafts were perfused.  Each had excellent flow and hemostasis was documented at the proximal and distal  anastomoses.  The patient was then rewarmed.   Temporary pacing wires were applied.  The lungs were re-expanded and the ventilator was resumed.  The patient was weaned from bypass without inotropes, without difficulty.  Cardiac output and blood pressure remained stable.  Protamine was administered and the cannulas were removed.  The mediastinum was irrigated with warm antibiotic irrigation.  The leg incision was irrigated and closed in a standard fashion. The pericardium was loosely reapproximated.  Two mediastinal and a left pleural chest tube were placed and brought out through separate incisions. The sternum was reapproximated with interrupted steel wire.  The pectoralis fascia was closed with interrupted #1 Vicryl.  The subcutaneous tissues and skin were were closed with a running Vicryl suture.  Total bypass time was 95 minutes with an aortic crossclamp time of 55 minutes. Dictated by:   Mikey Bussing, M.D. Attending Physician:  Mikey Bussing DD:  06/18/01 TD:  06/19/01 Job: [id number]

## 2010-08-19 NOTE — Cardiovascular Report (Signed)
[place]. [facility]  Patient:    Billy [name], Billy [name] Visit Number: [id number] MRN: [id number]          Service Type: MED Location: [phone] Attending Physician:  Lenoria Farrier Dictated by:   Everardo Beals Juanda Chance, M.D. [facility] Proc. Date: 06/14/01 Admit Date:  06/13/2001   CC:         [place] R. Rankins, M.D., [place], [place]  Cardiac Catheterization Lab   Cardiac Catheterization  CLINICAL HISTORY:  Mr. Seago is 45 years old and had a stent placed in the circumflex artery two or three years ago at [facility].  He recently developed chest pain with exertion which has become progressive and now occurs with minimal exertion.  We saw him in the office yesterday and admitted him for evaluation with catheterization.  DESCRIPTION OF PROCEDURE:  The procedure was performed via the right femoral artery using an arterial sheath and #6 [place] preformed coronary catheters.  A front wall arterial puncture was performed and Omnipaque contrast was used.  A distal aortogram was performed to rule out abdominal aortic aneurysm.  We had difficulty selectively entering the right coronary artery and did an aortic root to help define the anatomy.  We used an AL-1 diagnostic catheterization to help identify the right coronary artery.  The patient tolerated the procedure well and left the laboratory in satisfactory condition.  HEMODYNAMIC DATA:  The aortic pressure was 131/81 with a mean of 102.  Left ventricular pressure was 131/33.  RESULTS: 1. Left main coronary artery:  The left main coronary artery was free of    significant disease.  2. Left anterior descending artery:  The left anterior descending artery gave    rise to a large diagonal branch, a large septal perforator, a small septal    perforator, and a small diagonal branch.  There was 90% stenosis in the    proximal LAD at the takeoff of the diagonal branch.  There was 80%    narrowing in the ostium  of the diagonal branch.  3. Left circumflex coronary artery:  The left circumflex coronary artery gave    rise to a marginal branch and a posterolateral branch.  The stent was    located just after the marginal branch and the vessel was completely    occluded at the stent site.  The distal vessel which consisted of a    posterolateral branch filled via collaterals from the LAD.  There were 80%    and 70% stenoses in the marginal branch.  4. Right coronary artery:  The right coronary artery was completely occluded    after a marginal branch.  The distal right coronary artery filled via    collaterals primarily from the right coronary artery but some from the left    coronary artery as well.  This consisted of a posterior descending and two    posterolateral branches.  There appeared to be a 90% stenosis after the    posterior descending branch in the AV portion of the right coronary artery.  LEFT VENTRICULOGRAPHY:  Left ventriculogram performed in the RAO projection showed akinesis of the inferobasal segment.  The overall wall motion was good with an estimated ejection fraction of 50%.  CONCLUSIONS:  Severe three-vessel coronary artery disease with 90% stenosis of the proximal left anterior descending artery and 80% stenosis of a diagonal branch (branch stenosis), total occlusion of the distal circumflex artery with 80% and 70% stenosis in the marginal branch, total  occlusion of the mid right coronary artery with 90% stenosis in the distal right coronary artery, and inferobasal wall hypokinesis.  RECOMMENDATIONS:  The patient has severe three-vessel disease and two of the lesions are chronic total occlusions and are unfavorable for intervention and the third lesion is a branch stenosis which is difficult for intervention.  I think bypass surgery would be his best therapeutic option. Dictated by:   Everardo Beals Juanda Chance, M.D. [facility] Attending Physician:  Lenoria Farrier DD:  06/14/01 TD:   06/15/01 Job: [id number]

## 2010-08-19 NOTE — Op Note (Signed)
NAME:  LENWOOD, BALSAM              ACCOUNT NO.:  [id number]   MEDICAL RECORD NO.:  [id number]          PATIENT TYPE:  AMB   LOCATION:  DAY                          FACILITY:  [facility]   PHYSICIAN:  Lindaann Slough, M.D.  DATE OF BIRTH:  28-Jun-1965   DATE OF PROCEDURE:  07/23/2006  DATE OF DISCHARGE:                               OPERATIVE REPORT   PREOPERATIVE DIAGNOSIS:  Phimosis.   POSTOPERATIVE DIAGNOSIS:  Phimosis.   PROCEDURE:  Circumcision.   SURGEON:  Danae Chen, M.D.   ANESTHESIA:  General.   INDICATIONS:  The patient is a 45 years old male who has been  complaining of difficulty retracting his foreskin.  He also has been  having irritation of the foreskin and a constricting band on the  foreskin with erections.  He was found on examination to have phimosis.  He is scheduled now for circumcision.   Under general anesthesia the patient was prepped and draped and placed  in the supine position, two circumferential incisions were made on the  foreskin and the foreskin in between those two incisions was excised.  Hemostasis was secured with electrocautery.  Then skin approximation was  done with #4-0 chromic.   The patient tolerated the procedure well and left the OR in satisfactory  condition to post anesthesia care unit.      Lindaann Slough, M.D.  Electronically Signed     MN/MEDQ  D:  07/23/2006  T:  07/23/2006  Job:  [id number]

## 2010-08-19 NOTE — Assessment & Plan Note (Signed)
[facility]                                 ON-CALL NOTE   Billy [name], Billy [name]                       MRN:          [id number]  DATE:05/30/2006                            DOB:          July 11, 1965    PHONE NUMBER:  [phone]   HISTORY:  Billy [name] is a patient followed by Dr. Jens Som who  underwent lower extremity arterial intervention today with Dr. Excell Seltzer.  He called the answering service with some complaints of left leg and  buttock pain. Dr. Excell Seltzer happened to be close by and actually spoke to  the patient. He asked the patient to get up and walk around for a while  to see how he was doing. Dr. Excell Seltzer planned on calling the patient back  in about 30 minutes for further assessment.   PLAN AND DISPOSITION:  As noted above.      Tereso Newcomer, PA-C  Electronically Signed      Madolyn Frieze. Jens Som, MD, [facility]  Electronically Signed   SW/MedQ  DD: 05/30/2006  DT: 05/30/2006  Job #: [id number]

## 2010-08-19 NOTE — Assessment & Plan Note (Signed)
[facility]                            CARDIOLOGY OFFICE NOTE   Billy [name], Billy [name]                       MRN:          [id number]  DATE:04/13/2006                            DOB:          1965/10/12    Billy [name] returns for followup today. He has a history of ischemic  cardiomyopathy and coronary artery disease. Since I last saw him there  is no significant dyspnea on exertion, orthopnea, PND, pedal edema,  palpitations, pre-syncope, syncope, or chest pain.   His medications include:  1. Aspirin 81 mg daily.  2. Multivitamin daily.  3. Vytorin 10/40 mg nightly.  4. Lisinopril 40 mg daily.  5. Toprol 100 mg daily.   PHYSICAL EXAMINATION:  VITAL SIGNS: Blood pressure 126/80, pulse 57,  weight 240.  CHEST: Clear.  CARDIOVASCULAR: Regular rate and rhythm.  EXTREMITIES: No edema. He has 2+ dorsalis pedis pulse on the right and  1+ on the left.   His electrocardiogram shows sinus rhythm at a rate of 57. There are no  ST changes.   DIAGNOSIS:  1. Ischemic cardiomyopathy.  2. Coronary artery disease, status coronary bypassing graft.  3. Hypertension.  4. Hyperlipidemia.   PLAN:  Billy [name] is doing well from a symptomatic standpoint. He is  complaining of pain in his left calf with ambulation. I will schedule  him to have a ABI to further evaluate. We also plan to repeat his  echocardiogram as his medications have now been titrated. If his EF is  less than 35% then he will be seen by one of electrophysiologists for  consideration of ICD. We will plan to re-check his liver functions as  they were mildly elevated recently with the addition of Vytorin.  Finally, he does have some snoring and daytime somnolence. We will  schedule him for a sleep study. He will continue with the risk factor  modification. I will see him back in 3 months.     Madolyn Frieze Jens Som, MD, [facility]  Electronically Signed    BSC/MedQ  DD: 04/13/2006  DT: 04/14/2006  Job  #: [id number]

## 2010-08-19 NOTE — Assessment & Plan Note (Signed)
[facility]                              CARDIOLOGY OFFICE NOTE   Billy [name], Billy [name]                       MRN:          [id number]  DATE:10/23/2005                            DOB:          Jul 28, 1965    Billy [name] returns for followup today.  Please refer to my note of  08/23/2005 for details.  He has a history of coronary disease status post  coronary artery bypass graft.  When I last saw him we scheduled him to have  a nuclear study which was performed on 09/28/2005.  He was found to have an  ejection fraction of 36%.  There was a prior inferolateral infarct with  minimal if any peri-infarct ischemia.  The ejection fraction was 36%.  Since  that time he denies any dyspnea, chest pain, palpitations or syncope.  There  is no pedal edema.   MEDICATIONS:  1.  Toprol 50 mg p.o. daily.  2.  Aspirin 81 mg p.o. daily.  3.  Multivitamin 1 p.o. q.d.  4.  Zocor 40 mg p.o. q.h.s.  5.  Nitroglycerin patch.   PHYSICAL EXAMINATION:  VITAL SIGNS:  Blood pressure 143/83, and his pulse is  79.  Weight is 233 pounds.  NECK:  Supple.  CHEST:  Clear.  CARDIOVASCULAR:  Regular rate and rhythm.  EXTREMITIES:  No edema.   DIAGNOSES:  1.  Coronary artery disease status post coronary artery bypass graft.  2.  Ischemic cardiomyopathy.  3.  Hypertension.  4.  Hyperlipidemia.   PLAN:  Billy [name] is doing reasonably well from a symptomatic standpoint.  He only has dyspnea with more moderate activities but not with routine  activities around the house.  There is no chest pain at present.  His  nuclear study showed an ejection fraction of 36% with prior inferolateral  infarct.  I have asked him to begin an ACE inhibitor for his reduced LV  function.  We will begin with lisinopril 20 mg p.o. daily and increase this  with a goal of 40 mg p.o. daily.  We will also titrate his Toprol further in  the future as tolerated by pulse and blood pressure.  We will  discontinue  his nitroglycerin patch.  In one week he will return for fasting lipids and  liver as well as a BMET.  We will adjust his Zocor with a goal LDL of less  than 70 given his history of coronary disease.  Finally, we discussed the  importance of diet and exercise.  He will see me back in four weeks for  medication titration.  Note, we will plan to proceed with an  echocardiogram after his medications have been fully titrated.  If his  ejection fraction ever becomes 35% or less then he will be referred for  consideration of an ICD.                              Madolyn Frieze Jens Som, MD, [facility]    BSC/MedQ  DD:  10/23/2005  DT:  10/24/2005  Job #:  [id number]   cc:   Lorelle Formosa, MD

## 2010-08-19 NOTE — H&P (Signed)
[place]. [facility]  Patient:    JARED, WHORLEY Visit Number: [id number] MRN: [id number]          Service Type: Attending:  Everardo Beals. Juanda Chance, M.D. [facility] Dictated by:   Guy Franco, P.A. Adm. Date:  06/13/01   CC:         Dorian Pod, [place]   History and Physical  HISTORY OF PRESENT ILLNESS:  Mr. Freas is a 45 year old male patient with known coronary artery disease.  He experienced an acute MI in February of 2000 and presented to [facility].  Catheterization at that time revealed an EF of 50%. He had a totalled circumflex at the mid-vessel and a stent was placed at that time with good results.  His left main and the LAD were normal.  Right coronary artery had a 50% proximal lesion followed by a 50% distal lesion.  He had essentially had no real cardiac followup since that point.  Several weeks ago, on May 28, 2001, he went to [facility] with substernal chest pain with bilateral arm radiation and tooth pain.  He has noted some dyspnea and decreased energy.  He denies any palpitations, PND, orthopnea or dizziness or syncope.  Now he complains of chest pain with minimal exertion, including pain with brushing his teeth and even with sleeping.  PAST MEDICAL HISTORY:  Significant for CAD as above.  FAMILY HISTORY:  There is a family history of hypertension in his mother at age 66, dad had a history of MI at age 6; he is deceased.  SOCIAL HISTORY:  He lives in [place] with his wife and three girls.  He works in [employer].  He smokes one pack of cigarettes per day, occasional alcohol, with no illicit drug or herbal medicine use.  REVIEW OF SYSTEMS:  As above, otherwise negative.  ALLERGIES:  No known drug allergies.  MEDICATIONS: 1. Baby aspirin 81 mg a day. 2. Altace 2.5 mg a day. 3. Metoprolol 50 mg b.i.d. 4. Sublingual nitroglycerin p.r.n. 5. Multivitamin q.d.  PHYSICAL EXAMINATION:  VITAL SIGNS:  Pulse 53,  respirations 16, blood pressure 138/86.  Weight 230.  HEENT:  Grossly normal.  NECK:  No carotid or subclavian bruits.  CHEST:  Clear to auscultation bilaterally.  No wheezing or rhonchi.  HEART:  Regular rate and rhythm.  No gallops, rub, murmur or ectopy.  ABDOMEN:  Good bowel sounds.  Nontender, nondistended.  No masses.  No femoral bruits.  No abdominal bruits.  EXTREMITIES:  Lower extremities:  No peripheral edema.  NEUROLOGIC:  Grossly intact.  RECTAL:  Deferred.  ASSESSMENT AND PLAN: 1. Unstable angina. 2. Known coronary artery disease, status post stent x2 at [facility] in the year    2000 to the circumflex lesion.  His electrocardiogram is normal at this time, sinus bradycardia, with no acute changes.  We will cycle enzymes, perform an electrocardiogram, place on intravenous heparin, intravenous nitroglycerin and aspirin.  Plan for cardiac catheterization in the morning.  Patient was seen and examined by Dr. Everardo Beals. [place]. Dictated by:   Guy Franco, P.A. Attending:  Everardo Beals Juanda Chance, M.D. [facility] DD:  06/13/01 TD:  06/14/01 Job: [id number]

## 2010-08-19 NOTE — Assessment & Plan Note (Signed)
[facility]                            CARDIOLOGY OFFICE NOTE   ASAH, LAMAY                       MRN:          [id number]  DATE:07/19/2006                            DOB:          1965/06/07    [name] is a pleasant gentleman with an ischemic cardiomyopathy,  coronary artery disease, status post coronary artery bypass graft in  March 2003, hypertension and hyperlipidemia who returns for followup.  Since I last saw him, he was seen by Dr. Excell Seltzer for his abnormal ABIs.  He underwent successful stenting of an occluded left superficial femoral  artery on February 27.  Since then, he feels well.  He denies any  dyspnea on exertion, orthopnea, PND, pedal edema, palpitations,  presyncope or syncope.  He is not having chest pain with exertion,  although he does state he feels mild pressure when he initially begins  his walk that does not persist and he walks 30-40 minutes at a time  without having further symptoms.  His claudication is much improved.   MEDICATIONS:  1. Aspirin 81 mg p.o. daily.  2. Multivitamin one p.o. daily.  3. Vytorin 10/40 daily (the patient has discontinued this medication      on his own due to concern about Zetia).  4. Lisinopril 40 mg p.o. daily.  5. Toprol 100 mg p.o. daily.  6. Plavix 75 mg p.o. daily.   PHYSICAL EXAMINATION:  VITAL SIGNS:  Blood pressure 148/84, pulse 67.  Weight 241 pounds.  NECK:  Supple with no bruits.  CHEST:  Clear.  CARDIAC:  Regular rate and rhythm.  EXTREMITIES:  No edema.   Electrocardiogram shows a sinus rhythm at a rate of 67.  The axis is  normal.  Prior inferior infarct cannot be excluded.   ASSESSMENT/PLAN:  1. Ischemic cardiomyopathy.  The patient's most recent echocardiogram      was performed on April 26, 2006, and his ejection fraction was 40-      50%.  We will continue to titrate his medications.  I will leave      his lisinopril at the same dose.  We will increase  his Toprol to      150 mg p.o. daily.  2. Hyperlipidemia.  The patient has discontinued the Vytorin on his      own due to concerns of Zetia.  Will begin Lipitor 80 mg p.o. daily.      Will check lipids and liver in 6 weeks and adjust as indicated.  3. Coronary artery disease, status post coronary artery bypass graft.      We will continue with his aspirin, Statin, ACE inhibitor and beta-      blocker.  4. Preoperative evaluation prior to circumcision.  The patient did      have a Myoview performed on September 28, 2005.  The ejection fraction      was 36% and there was minimal, if any, peri-infarct ischemia,      although there was an inferolateral infarct.  I think it is safe      for him to proceed with his  circumcision without further cardiac      workup.  He also can discontinue his Plavix prior to procedure and      I would ask that he resume it afterwards.  5. Hypertension.  His blood pressure is mildly elevated today.  We      will increase his Toprol as described in #1.  6. Peripheral vascular disease.  We will continue with medical therapy      and his symptoms are much improved following his recent procedure.  7. Risk factor modification.  We discussed the importance of exercise      and diet today.  8. We will see him back in 6 months.     Billy Frieze Jens Som, MD, [facility]  Electronically Signed    BSC/MedQ  DD: 07/19/2006  DT: 07/19/2006  Job #: [id number]

## 2010-08-19 NOTE — Discharge Summary (Signed)
[place]  Patient:    Billy [name], Billy [name] Visit Number: [id number] MRN: [id number]          Service Type: MED Location: 2000 2014 01 Attending Physician:  Mikey Bussing Dictated by:   Dominica Severin, P.A. Admit Date:  06/13/2001 Discharge Date: 06/22/2001   CC:         Bruce R. Juanda Chance, M.D. [facility]   Discharge Summary  DATE OF BIRTH:  07/23/1965.  ADMISSION DIAGNOSIS:  Unstable angina.  DISCHARGE DIAGNOSES/PAST MEDICAL HISTORY: 1. Coronary artery disease, status post myocardial infarction in February    2000. 2. Status post stent of the circumflex artery. 3. Hypertension. 4. History of heme-positive stools. 5. Current nicotine abuse.  NEW DIAGNOSES/DISCHARGE DIAGNOSES: 1. Severe coronary artery disease. 2. Postoperative delayed chest tube output, resolved. 3. Status post coronary artery bypass graft surgery x4.  PROCEDURES: 1. Cardiac catheterization done on June 14, 2001. 2. Pre-coronary artery bypass graft Doppler evaluation June 17, 2001. 3. Coronary artery bypass graft surgery x4 with the following grafts placed:    The left internal mammary artery to left anterior descending artery,    saphenous vein graft to the posterior descending artery, and saphenous vein    graft to the obtuse marginal branch and saphenous vein graft to the ramus    branch.  HOSPITAL COURSE:  This patient is a 45 year old African-American male with a history of prior posterolateral myocardial infarction when he occluded his circumflex in the year 2000.  He was treated with a circumflex stent.  He now returned with unstable angina and was admitted and ruled in for an MI.  The cardiac catheterization had demonstrated severe three-vessel disease.  He was felt to be a good surgical candidate.  He was seen by Dr. Donata Clay.  It was determined he would benefit from a coronary artery bypass graft surgery.  He underwent the procedure as stated above on  June 18, 2001.  The patient tolerated the procedure well.  It is noted that his distal circumflex was too small to graft, and it was found that he had a posterolateral old myocardial infarction.  He was transferred to the SICU in stable condition.  He was later extubated that evening of the 18th.  On postop day 1 he was doing well.  He did have some significant chest tube output.  Hematocrit had dropped from 44 to 31.  Otherwise the chest x-ray did not show any evidence of accumulation. The patient was kept in the ICU and a CBC was followed up in the morning, which showed his hematocrit at 32.  He was later transferred to unit 2000.  He continued to improve.  He did not have any cardiac or respiratory complications.  He was tolerating his diet.  He was ambulating without difficulty.  His wounds remained clean and dry, indicating signs of healing well.  He was tolerating room air saturations.  He was ready for discharge the morning of May 25, 2001.  DISCHARGE CONDITION:  Stable.  DISCHARGE MEDICATIONS: 1. A coated aspirin 325 mg daily. 2. Tylox one or two tablets every four to six hours as needed for pain. 3. Toprol XL 25 mg daily. 4. Catapres 0.2 mg.  He is to change this every seven days. 5. The patient was on Altace prior to his admission on 2.5 mg daily.  This    may be restarted at the time of discharge, if not later on at his follow-up  appointment.  DISCHARGE INSTRUCTIONS:  The patient is instructed not to do any driving, lifting more than 10 pounds.  He was to walk daily and continue breathing exercises.  He was to follow a heart-healthy low-fat, low-sodium diet.  Told he could shower, clean his wounds with mild soap and water, and call the office if any new problems arise as noted on fact sheet.  He was given a cardiac surgery fat sheet and has a staple removal appointment on Friday, March 28, at 10 a.m., at Dr. Vincent Gros office.  He is to call to schedule  an appointment to see Dr. Juanda Chance in two weeks with a chest x-ray.  He is to bring his chest x-ray with him to his appointment with Dr. Donata Clay on Friday, the 11th, at 11:30 a.m.  He is also instructed to discontinue smoking. Dictated by:   Dominica Severin, P.A. Attending Physician:  Mikey Bussing DD:  06/21/01 TD:  06/24/01 Job: [id number]

## 2010-08-24 ENCOUNTER — Encounter: Payer: Self-pay | Admitting: Cardiology

## 2010-08-25 ENCOUNTER — Ambulatory Visit: Payer: Medicare Other | Admitting: Cardiology

## 2010-08-25 ENCOUNTER — Encounter: Payer: Medicare Other | Admitting: *Deleted

## 2010-10-06 ENCOUNTER — Ambulatory Visit: Payer: Medicare Other | Admitting: Cardiology

## 2010-10-06 ENCOUNTER — Encounter: Payer: Medicare Other | Admitting: *Deleted

## 2010-10-19 ENCOUNTER — Other Ambulatory Visit: Payer: Self-pay | Admitting: Internal Medicine

## 2010-11-16 ENCOUNTER — Other Ambulatory Visit: Payer: Self-pay | Admitting: Cardiology

## 2010-11-17 ENCOUNTER — Encounter: Payer: Medicare Other | Admitting: *Deleted

## 2010-11-17 ENCOUNTER — Encounter: Payer: Medicare Other | Admitting: Cardiology

## 2010-11-17 NOTE — Progress Notes (Signed)
This encounter was created in error - please disregard.

## 2010-11-25 ENCOUNTER — Encounter: Payer: Self-pay | Admitting: Cardiology

## 2010-12-22 ENCOUNTER — Other Ambulatory Visit: Payer: Self-pay | Admitting: Cardiovascular Disease

## 2011-01-02 LAB — CBC
Hemoglobin: 15.1
Hemoglobin: 15.4
Hemoglobin: 14.9
MCHC: 33.9
MCHC: 34
MCHC: 33.5
Platelets: 230
Platelets: 248
RBC: 5.07
RDW: 14.6
RDW: 14.7

## 2011-01-02 LAB — BASIC METABOLIC PANEL
BUN: 7
CO2: 27
Calcium: 9.1
Calcium: 9.1
Creatinine, Ser: 1.03
Creatinine, Ser: 1.15
GFR calc non Af Amer: >60
GFR calc non Af Amer: >60
Glucose, Bld: 92
Glucose, Bld: 95
Potassium: 4.2
Sodium: 140

## 2011-01-02 LAB — DIFFERENTIAL
Basophils Absolute: 0.1
Basophils Relative: 1
Lymphocytes Relative: 35
Monocytes Absolute: 0.8
Monocytes Relative: 8
Neutro Abs: 4.6
Neutrophils Relative %: 51

## 2011-01-02 LAB — APTT: aPTT: 36

## 2011-01-02 LAB — PROTIME-INR
INR: 1
Prothrombin Time: 13.8

## 2011-01-02 LAB — POCT I-STAT, CHEM 8
BUN: 11
Chloride: 106
Glucose, Bld: 100 — ABNORMAL HIGH
HCT: 45
Potassium: 4

## 2011-01-02 LAB — POCT CARDIAC MARKERS: Troponin i, poc: <0.05

## 2011-01-02 LAB — MAGNESIUM: Magnesium: 1.9

## 2011-01-05 LAB — BASIC METABOLIC PANEL
CO2: 27 mEq/L (ref 19–32)
Chloride: 106 mEq/L (ref 96–112)
Creatinine, Ser: 1.05 mg/dL (ref 0.4–1.5)
GFR calc Af Amer: >60 mL/min (ref >60)
Potassium: 4.1 mEq/L (ref 3.5–5.1)
Sodium: 138 mEq/L (ref 135–145)

## 2011-02-27 ENCOUNTER — Other Ambulatory Visit: Payer: Self-pay | Admitting: Cardiology

## 2011-05-28 ENCOUNTER — Other Ambulatory Visit: Payer: Self-pay | Admitting: Cardiovascular Disease

## 2011-06-12 ENCOUNTER — Encounter: Payer: Self-pay | Admitting: Cardiology

## 2011-06-12 ENCOUNTER — Ambulatory Visit (INDEPENDENT_AMBULATORY_CARE_PROVIDER_SITE_OTHER): Payer: Medicare Other | Admitting: Cardiology

## 2011-06-12 DIAGNOSIS — Z95810 Presence of automatic (implantable) cardiac defibrillator: Secondary | ICD-10-CM | POA: Insufficient documentation

## 2011-06-12 DIAGNOSIS — I2510 Atherosclerotic heart disease of native coronary artery without angina pectoris: Secondary | ICD-10-CM | POA: Diagnosis not present

## 2011-06-12 DIAGNOSIS — E785 Hyperlipidemia, unspecified: Secondary | ICD-10-CM

## 2011-06-12 DIAGNOSIS — I10 Essential (primary) hypertension: Secondary | ICD-10-CM

## 2011-06-12 DIAGNOSIS — E7800 Pure hypercholesterolemia, unspecified: Secondary | ICD-10-CM

## 2011-06-12 LAB — LIPID PANEL
Cholesterol: 146 mg/dL (ref 0–200)
HDL: 34.7 mg/dL — ABNORMAL LOW (ref >39.00)
LDL Cholesterol: 73 mg/dL (ref 0–99)
Triglycerides: 191 mg/dL — ABNORMAL HIGH (ref 0.0–149.0)
VLDL: 38.2 mg/dL (ref 0.0–40.0)

## 2011-06-12 LAB — BASIC METABOLIC PANEL
Chloride: 107 mEq/L (ref 96–112)
Creatinine, Ser: 1.1 mg/dL (ref 0.4–1.5)
Sodium: 139 mEq/L (ref 135–145)

## 2011-06-12 LAB — HEPATIC FUNCTION PANEL
Albumin: 3.9 g/dL (ref 3.5–5.2)
Alkaline Phosphatase: 94 U/L (ref 39–117)
Total Protein: 6.9 g/dL (ref 6.0–8.3)

## 2011-06-12 MED ORDER — ESOMEPRAZOLE MAGNESIUM 40 MG PO PACK: 40.0000 mg | PACK | Freq: Every day | ORAL | Status: DC

## 2011-06-12 NOTE — Assessment & Plan Note (Signed)
Followed by electrophysiology. 

## 2011-06-12 NOTE — Assessment & Plan Note (Signed)
Blood pressure controlled. Continue present medications. Check potassium and renal function. 

## 2011-06-12 NOTE — Progress Notes (Signed)
HPI: Pleasant male for FU of CAD. Patient suffered a cardiac arrest in November of 2010 during intercourse.  Cardiac catheterization at that time revealed an ejection fraction of 35%. Normal left main. The LAD had scattered 20-30% lesions. The first diagonal was occluded. The left circumflex had scattered 30-40% lesions and then occluded following the OM. The OM is stented, and the stent is widely patent without significant stenosis. The second OM and distal circumflex fill from left-to-left collaterals. The right coronary artery was known to be totally occluded in its proximal segment.  The distal right coronary artery branches fill from left-to-right collaterals. The saphenous vein graft to first diagonal is widely patent.  There is no significant stenosis.  There is mild 40-50% stenosis of the diagonal just after the graft insertion site. Left internal mammary artery to LAD is patent. Saphenous vein graft to distal right coronary artery is known to be occluded. The saphenous vein graft to OM branch was totally occluded. This is a chronic occlusion as well.  Echocardiogram initially revealed an EF of 10-15% however followup echo showed an EF of 40-45%, mild biatrial enlargement and mild mitral regurgitation.  Given out of the hospital VF arrest a [employer] single lead ICD was placed. I have not seen the patient since then the patient denies any dyspnea on exertion, orthopnea, PND, pedal edema, palpitations, syncope or chest pain.   Current Outpatient Prescriptions  Medication Sig Dispense Refill  . aspirin 81 MG EC tablet Take 81 mg by mouth daily.        [name] atorvastatin (LIPITOR) 80 MG tablet take 1 tablet by mouth once daily  30 tablet  11  . carvedilol (COREG) 25 MG tablet Take 25 mg by mouth 2 (two) times daily with a meal.        . furosemide (LASIX) 20 MG tablet TAKE 1 TABLET BY MOUTH ONCE DAILY  30 tablet  6  . KLOR-CON M20 20 MEQ tablet take 1 tablet by mouth once daily  30 tablet  3  .  lisinopril (PRINIVIL,ZESTRIL) 20 MG tablet take 1 tablet by mouth once daily  30 tablet  6  . Multiple Vitamin (MULTIVITAMIN) tablet Take 1 tablet by mouth daily.           Past Medical History  Diagnosis Date  . Cardiac arrest - ventricular fibrillation 02/11/2009    SUCCESSFUL PLACEMENT OF A [employer] JUDE MEDICAL FORTIFY SINGLE LEAD AUTOMATIC IMPLANTABLE CARDIOVERTER-DEFIBRILLATOR  . Coronary artery disease     s/p CABG  . Myocardial infarct 02/2009    NON-ST-SEGMENT ELEVATION MI  . Hypertension   . Hyperlipidemia   . Ischemic cardiomyopathy 02/09/2009    EF 40-45%  . Anoxic encephalopathy   . Chronic kidney disease     CHRONIC RENAL FAILURE  . Marijuana abuse   . Peripheral vascular disease, unspecified   . Blood in stool   . CHF (congestive heart failure)     CHRONIC SYSTOLIC ; EF 40-45%    Past Surgical History  Procedure Date  . Circumcision   . Coronary artery bypass graft 06/17/01    X 4  . Insert / replace / remove pacemaker     IMPLANTATION AICD-ST. JUDE FORTIFY    History   Social History  . Marital Status: Married    Spouse Name: N/A    Number of Children: N/A  . Years of Education: N/A   Occupational History  . Not on file.   Social History Main Topics  .  Smoking status: Former Smoker -- 2.0 packs/day for 20 years    Quit date: 04/04/2003  . Smokeless tobacco: Not on file  . Alcohol Use: No  . Drug Use: No  . Sexually Active:    Other Topics Concern  . Not on file   Social History Narrative   MARRIEDFORMER TOBACCO USE, SMOKED FOR 20 YRS 2 PPD; QUIT IN 36NO ETOHNO ILLICIT DRUG USENO REGULAR EXERCISEICD-ST. JUDE ; CERTIFIED LETTER SENT AND RETURNED BAD ADDRESS, PLS UPDATE DJW.    ROS: recent URI but no hemoptysis, dysphasia, odynophagia, melena, hematochezia, dysuria, hematuria, rash, seizure activity, orthopnea, PND, pedal edema, claudication. Remaining systems are negative.  Physical Exam: Well-developed well-nourished in no acute distress.    Skin is warm and dry.  HEENT is normal.  Neck is supple. No thyromegaly.  Chest is clear to auscultation with normal expansion.  Cardiovascular exam is regular rate and rhythm.  Abdominal exam nontender or distended. No masses palpated. Extremities show no edema. neuro grossly intact  ECG NSR with no ST changes

## 2011-06-12 NOTE — Assessment & Plan Note (Signed)
Continue aspirin and statin. 

## 2011-06-12 NOTE — Patient Instructions (Signed)
Your physician wants you to follow-up in:  6 months. You will receive a reminder letter in the mail two months in advance. If you don't receive a letter, please call our office to schedule the follow-up appointment.   

## 2011-06-12 NOTE — Assessment & Plan Note (Signed)
Continue beta blocker and ACE inhibitor. 

## 2011-06-12 NOTE — Assessment & Plan Note (Signed)
Continue statin. Check lipids and liver. 

## 2011-06-14 ENCOUNTER — Encounter: Payer: Self-pay | Admitting: *Deleted

## 2011-06-26 ENCOUNTER — Encounter: Payer: Medicare Other | Admitting: Internal Medicine

## 2011-06-28 ENCOUNTER — Other Ambulatory Visit: Payer: Self-pay | Admitting: Internal Medicine

## 2011-07-22 IMAGING — CR DG CHEST 1V PORT
1 series · 1 of 1 positions shown · non-contrast
Comparison: 3073 hours the same day and earlier.

CLINICAL DATA: 43-year-old male pulled out central venous catheter.
Query aspiration.

PORTABLE CHEST - 1 VIEW

[view not recorded]
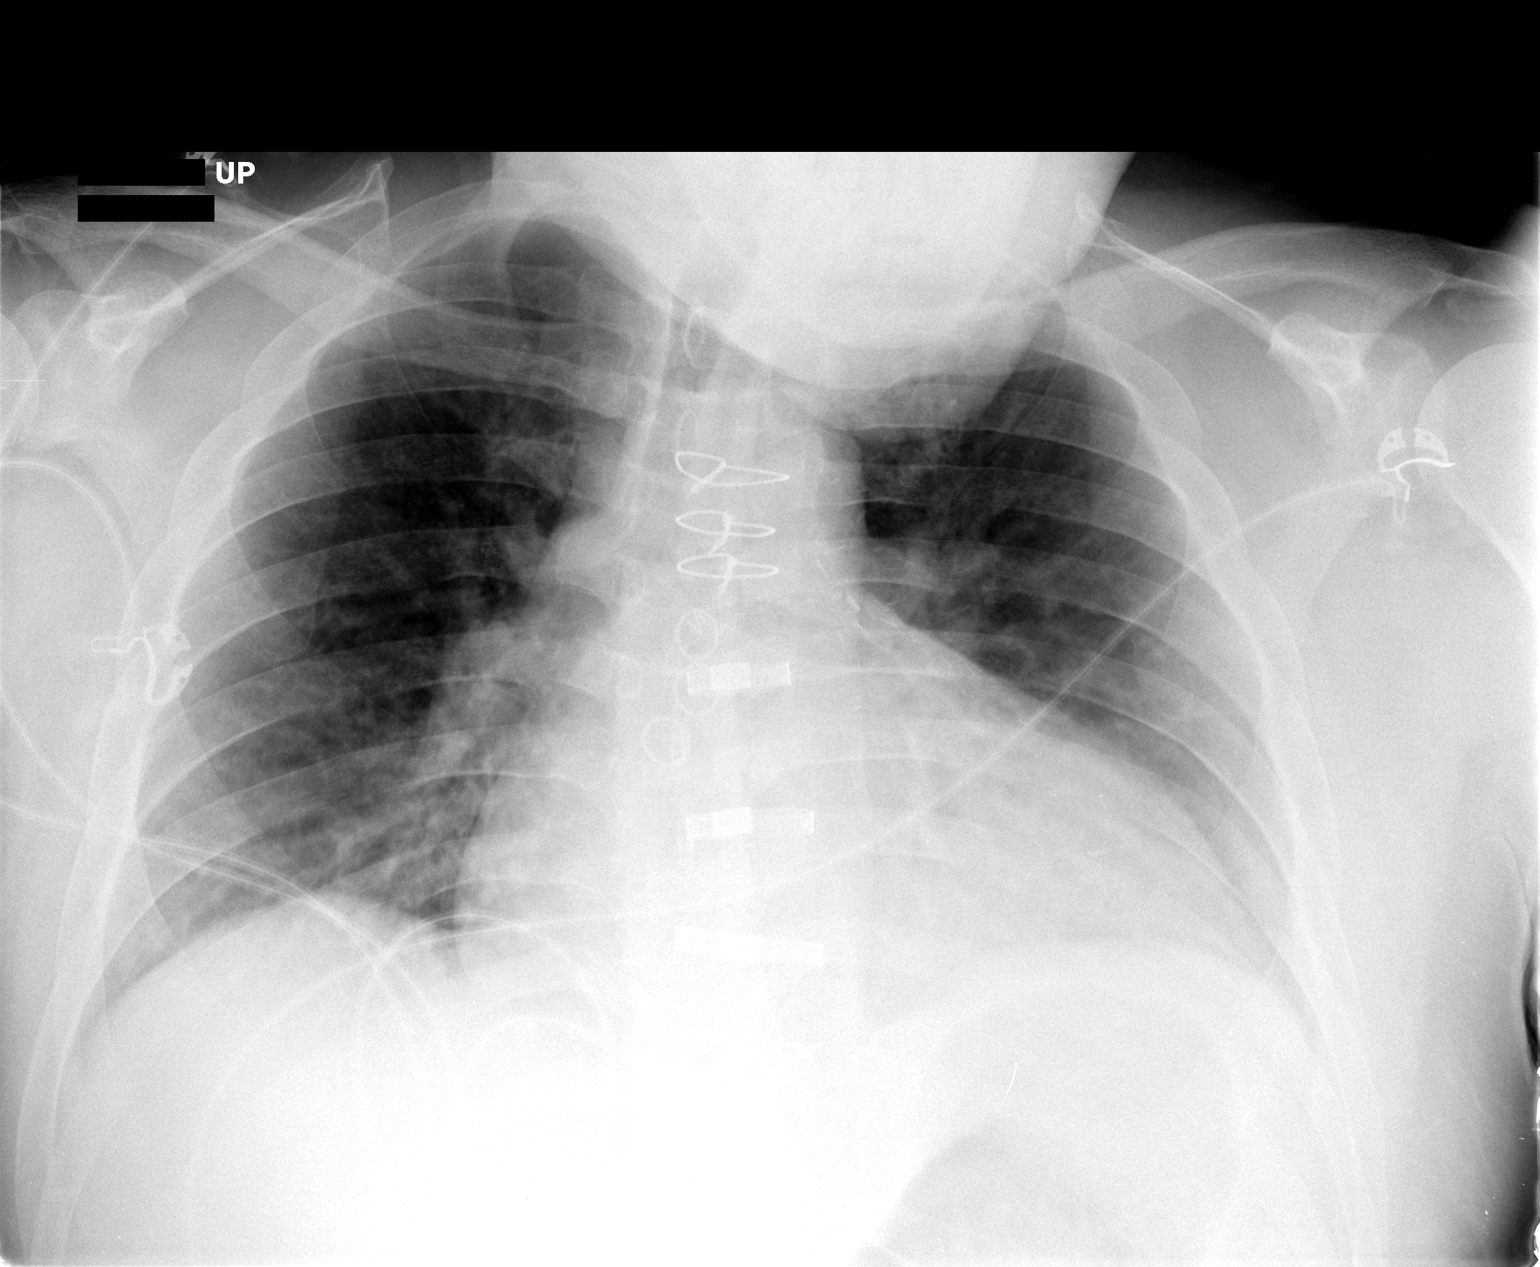

[1 of 1 positions shown; findings below may reference images not displayed]

FINDINGS: AP portable seated upright view 3968 hours.  Lower lung
volumes.  The patient is extubated.  Enteric tube is removed.  Left
IJ catheter is removed. Stable cardiomegaly and mediastinal
contours.  No pneumothorax.  Ongoing vascular congestion/mild
edema.  No large effusion.  No new or confluent airspace opacity.
IMPRESSION: 1.  Support apparatus removed.
2.  Lower lung volumes.  Mild pulmonary interstitial edema.  No
focal aspiration or pneumonia identified.

## 2011-07-22 IMAGING — CR DG CHEST 1V PORT
1 series · 1 of 1 positions shown · non-contrast
Comparison: 02/06/2009.

CLINICAL DATA: Chest pain.  Myocardial infarction.

PORTABLE CHEST - 1 VIEW

[view not recorded]
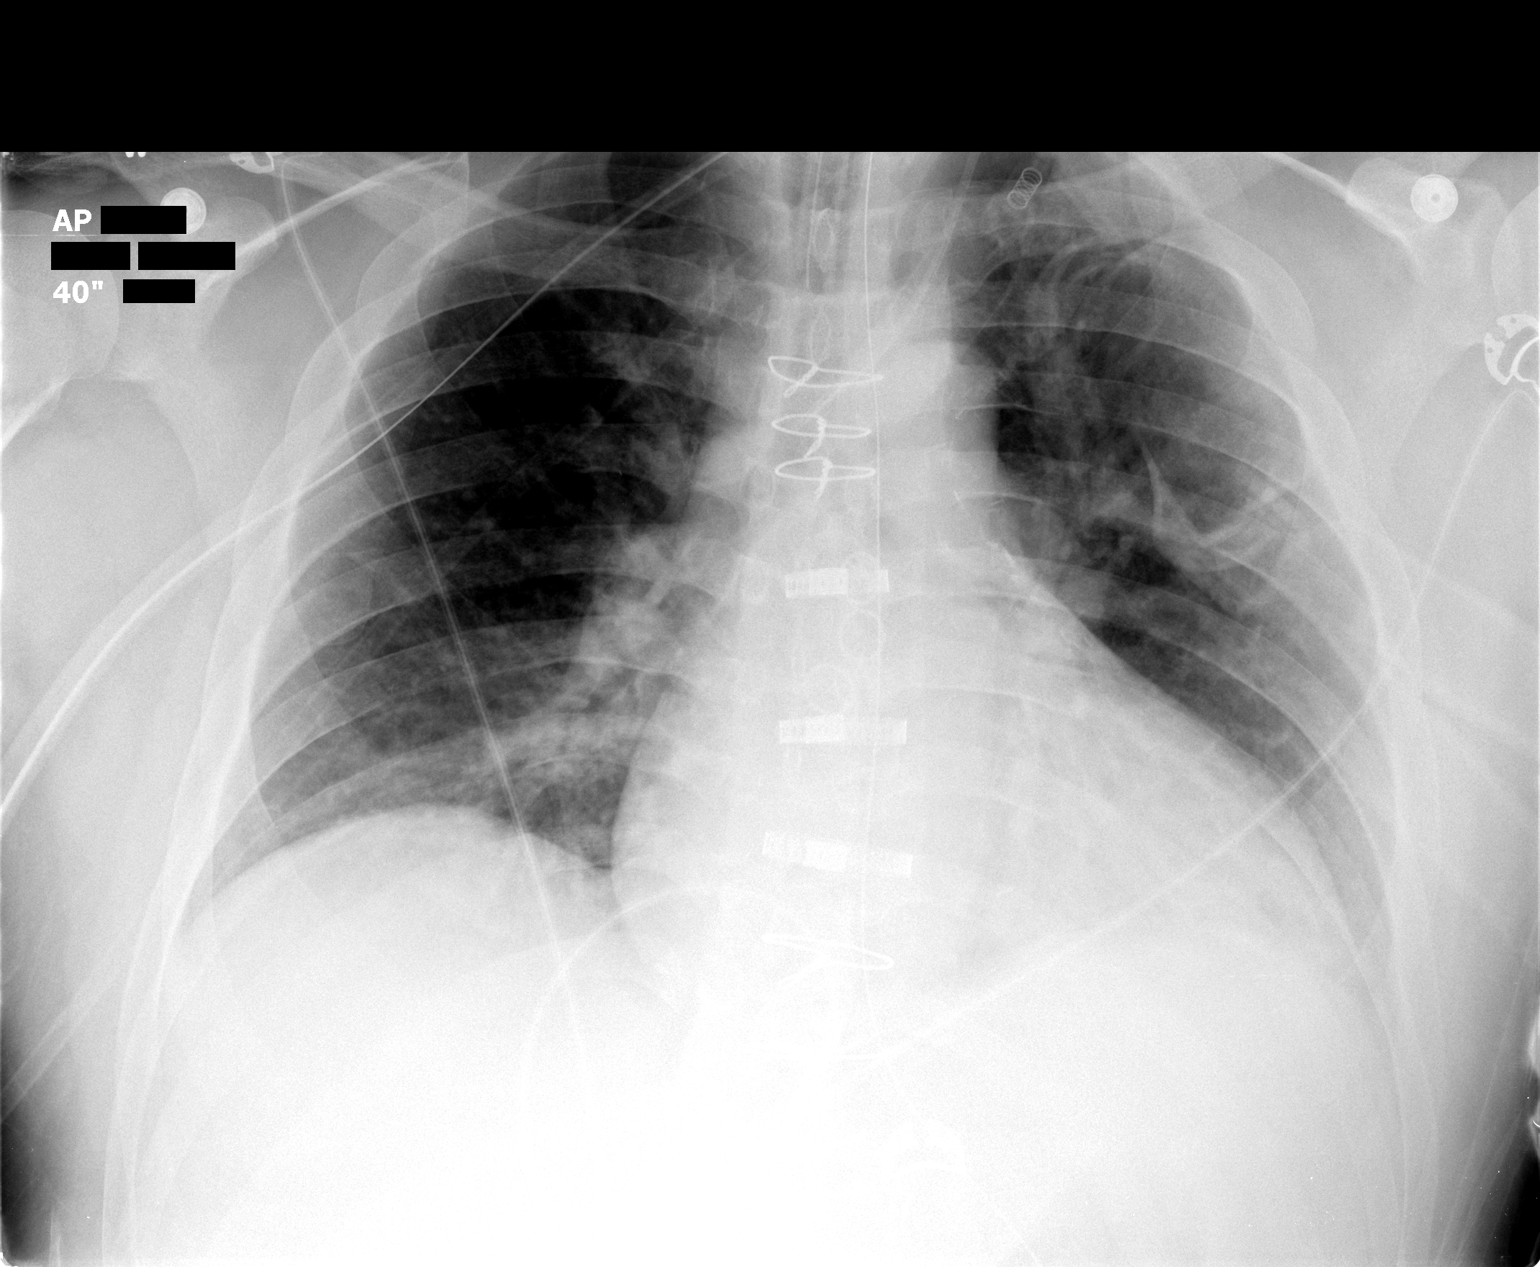

[1 of 1 positions shown; findings below may reference images not displayed]

FINDINGS: The endotracheal tube, nasogastric tube and left IJ
central line are unchanged compared to prior exam.
Cardiopericardial silhouette is enlarged.  Postoperative changes of
median sternotomy again noted.  Bilateral medial basilar opacity is
present most consistent with atelectasis.  Aeration is unchanged
compared to prior.
IMPRESSION: 1.  Stable support apparatus.
2.  Unchanged aeration and postoperative changes of the chest.

## 2011-07-23 IMAGING — CR DG CHEST 1V PORT
1 series · 1 of 1 positions shown · non-contrast
Comparison: 02/08/2009

CLINICAL DATA: Myocardial infarction

PORTABLE CHEST - 1 VIEW

[view not recorded]
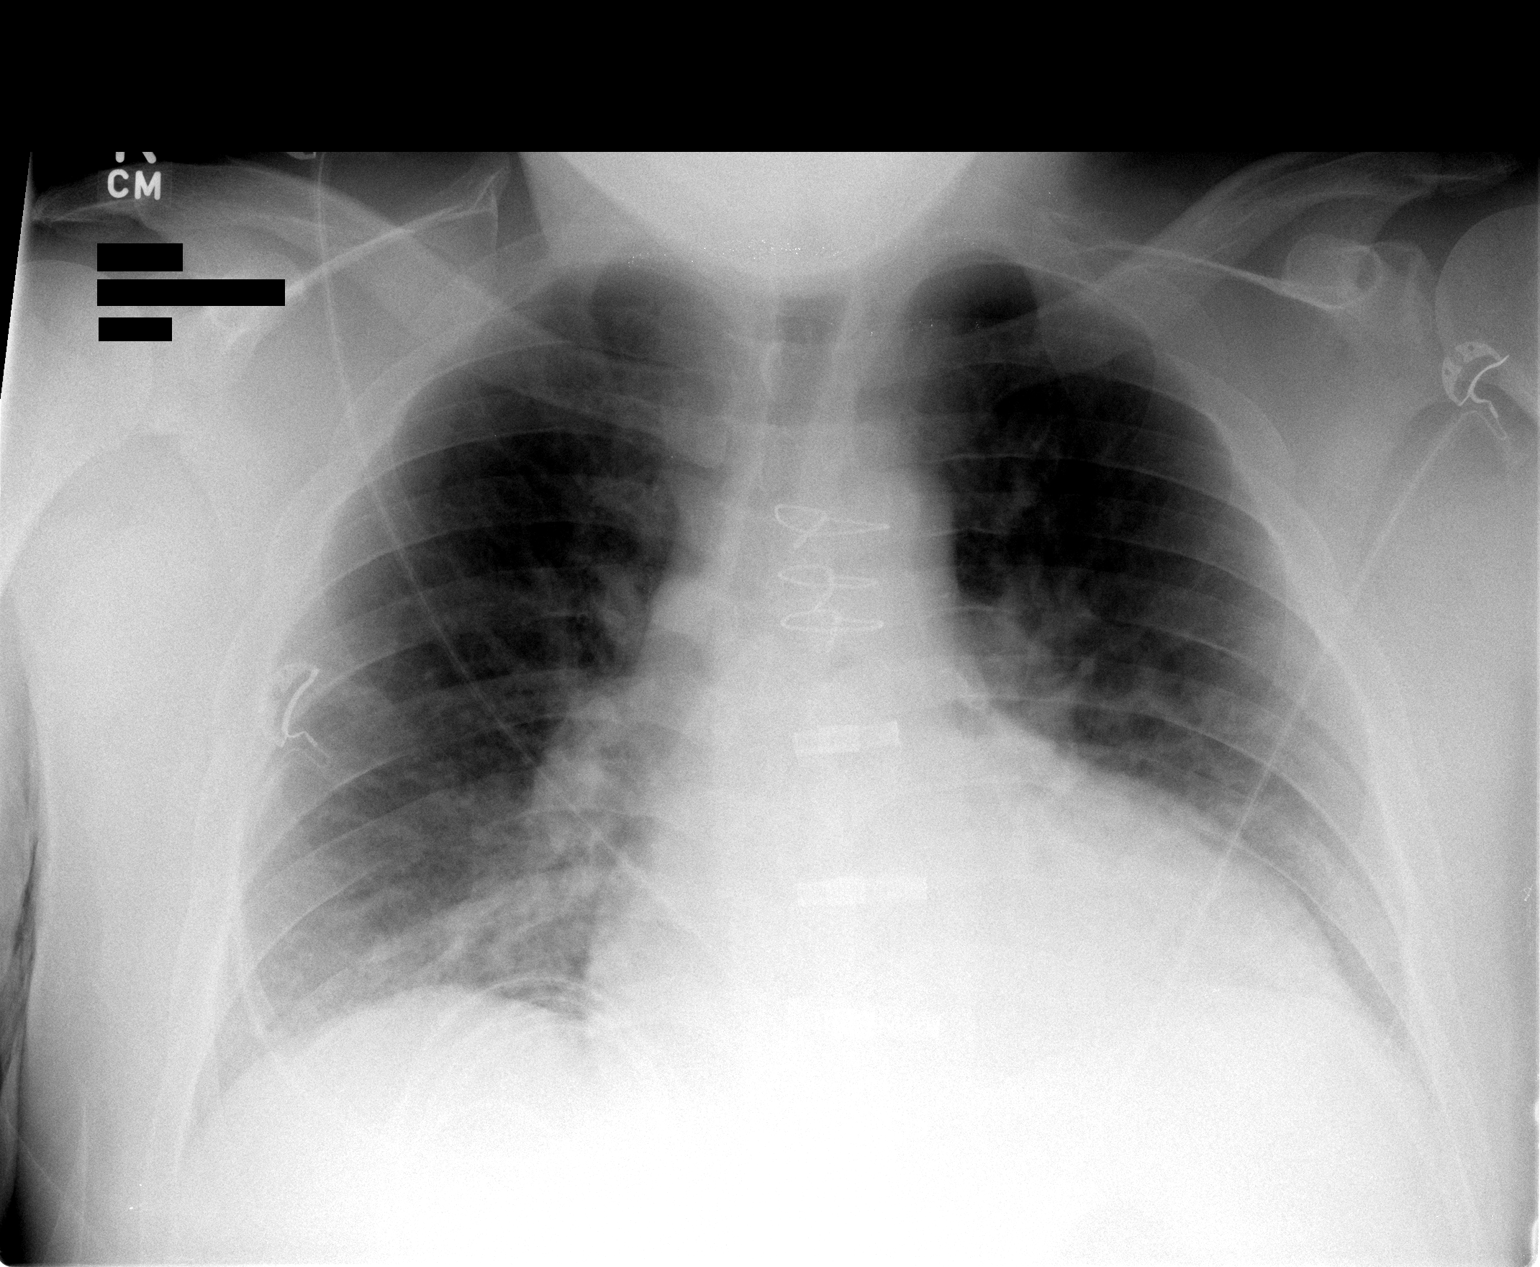

[1 of 1 positions shown; findings below may reference images not displayed]

FINDINGS: Heart size remains enlarged.

No pleural effusions are noted.

Mild edema pattern is similar to prior exam.
IMPRESSION: 1.  No change in cardiac enlargement and mild edema.

## 2011-08-08 ENCOUNTER — Other Ambulatory Visit: Payer: Self-pay | Admitting: Cardiology

## 2011-08-16 ENCOUNTER — Other Ambulatory Visit: Payer: Self-pay | Admitting: Cardiology

## 2011-08-19 ENCOUNTER — Other Ambulatory Visit: Payer: Self-pay | Admitting: Cardiology

## 2011-10-23 ENCOUNTER — Other Ambulatory Visit: Payer: Self-pay | Admitting: Cardiology

## 2012-01-02 ENCOUNTER — Other Ambulatory Visit: Payer: Self-pay | Admitting: Cardiology

## 2012-01-15 ENCOUNTER — Encounter (HOSPITAL_COMMUNITY): Payer: Self-pay | Admitting: Emergency Medicine

## 2012-01-15 ENCOUNTER — Emergency Department (HOSPITAL_COMMUNITY)
Admission: EM | Admit: 2012-01-15 | Discharge: 2012-01-16 | Disposition: A | Discharge status: Home / Self Care | Payer: Medicare Other | Attending: Emergency Medicine | Admitting: Emergency Medicine

## 2012-01-15 DIAGNOSIS — Z7982 Long term (current) use of aspirin: Secondary | ICD-10-CM | POA: Insufficient documentation

## 2012-01-15 DIAGNOSIS — R112 Nausea with vomiting, unspecified: Secondary | ICD-10-CM | POA: Diagnosis not present

## 2012-01-15 DIAGNOSIS — I2510 Atherosclerotic heart disease of native coronary artery without angina pectoris: Secondary | ICD-10-CM | POA: Insufficient documentation

## 2012-01-15 DIAGNOSIS — E860 Dehydration: Secondary | ICD-10-CM | POA: Diagnosis not present

## 2012-01-15 DIAGNOSIS — R109 Unspecified abdominal pain: Secondary | ICD-10-CM

## 2012-01-15 DIAGNOSIS — D72829 Elevated white blood cell count, unspecified: Secondary | ICD-10-CM | POA: Insufficient documentation

## 2012-01-15 DIAGNOSIS — I509 Heart failure, unspecified: Secondary | ICD-10-CM | POA: Insufficient documentation

## 2012-01-15 DIAGNOSIS — Z79899 Other long term (current) drug therapy: Secondary | ICD-10-CM | POA: Insufficient documentation

## 2012-01-15 DIAGNOSIS — Z951 Presence of aortocoronary bypass graft: Secondary | ICD-10-CM | POA: Insufficient documentation

## 2012-01-15 DIAGNOSIS — I10 Essential (primary) hypertension: Secondary | ICD-10-CM | POA: Insufficient documentation

## 2012-01-15 DIAGNOSIS — R52 Pain, unspecified: Secondary | ICD-10-CM | POA: Diagnosis not present

## 2012-01-15 NOTE — ED Notes (Signed)
Pt states he is having abd pain that started on Saturday night  Pt states today if he eats or drinks anything it comes back up  Denies diarrhea  Pt states he has a metallic taste in his mouth  Pt states he gets hot then cold

## 2012-01-16 ENCOUNTER — Emergency Department (HOSPITAL_COMMUNITY): Payer: Medicare Other

## 2012-01-16 DIAGNOSIS — E860 Dehydration: Secondary | ICD-10-CM | POA: Diagnosis not present

## 2012-01-16 DIAGNOSIS — Z951 Presence of aortocoronary bypass graft: Secondary | ICD-10-CM | POA: Diagnosis not present

## 2012-01-16 DIAGNOSIS — R112 Nausea with vomiting, unspecified: Secondary | ICD-10-CM | POA: Diagnosis not present

## 2012-01-16 DIAGNOSIS — R109 Unspecified abdominal pain: Secondary | ICD-10-CM | POA: Diagnosis not present

## 2012-01-16 DIAGNOSIS — D72829 Elevated white blood cell count, unspecified: Secondary | ICD-10-CM | POA: Diagnosis not present

## 2012-01-16 DIAGNOSIS — I10 Essential (primary) hypertension: Secondary | ICD-10-CM | POA: Diagnosis not present

## 2012-01-16 DIAGNOSIS — I2510 Atherosclerotic heart disease of native coronary artery without angina pectoris: Secondary | ICD-10-CM | POA: Diagnosis not present

## 2012-01-16 DIAGNOSIS — Z7982 Long term (current) use of aspirin: Secondary | ICD-10-CM | POA: Diagnosis not present

## 2012-01-16 DIAGNOSIS — Z79899 Other long term (current) drug therapy: Secondary | ICD-10-CM | POA: Diagnosis not present

## 2012-01-16 DIAGNOSIS — I509 Heart failure, unspecified: Secondary | ICD-10-CM | POA: Diagnosis not present

## 2012-01-16 LAB — URINE MICROSCOPIC-ADD ON

## 2012-01-16 LAB — LACTIC ACID, PLASMA: Lactic Acid, Venous: 1.1 mmol/L (ref 0.5–2.2)

## 2012-01-16 LAB — COMPREHENSIVE METABOLIC PANEL
ALT: 26 U/L (ref 0–53)
AST: 22 U/L (ref 0–37)
Alkaline Phosphatase: 101 U/L (ref 39–117)
CO2: 23 mEq/L (ref 19–32)
Chloride: 100 mEq/L (ref 96–112)
GFR calc non Af Amer: >90 mL/min (ref >90)
Sodium: 137 mEq/L (ref 135–145)
Total Bilirubin: 0.8 mg/dL (ref 0.3–1.2)

## 2012-01-16 LAB — CBC WITH DIFFERENTIAL/PLATELET
Eosinophils Relative: 2 % (ref 0–5)
Hemoglobin: 18.6 g/dL — ABNORMAL HIGH (ref 13.0–17.0)
MCV: 80.2 fL (ref 78.0–100.0)
Monocytes Relative: 9 % (ref 3–12)
Neutrophils Relative %: 67 % (ref 43–77)
Platelets: 256 10*3/uL (ref 150–400)
RBC: 6.22 MIL/uL — ABNORMAL HIGH (ref 4.22–5.81)
WBC: 16 10*3/uL — ABNORMAL HIGH (ref 4.0–10.5)

## 2012-01-16 LAB — URINALYSIS, ROUTINE W REFLEX MICROSCOPIC
Bilirubin Urine: NEGATIVE
Glucose, UA: NEGATIVE mg/dL
Hgb urine dipstick: NEGATIVE
Ketones, ur: 15 mg/dL — AB
Protein, ur: NEGATIVE mg/dL

## 2012-01-16 MED ORDER — OXYCODONE-ACETAMINOPHEN 5-325 MG PO TABS
1.0000 | ORAL_TABLET | Freq: Once | ORAL | Status: AC
  Administered 2012-01-16: 1 via ORAL

## 2012-01-16 MED ORDER — IOHEXOL 300 MG/ML  SOLN
100.0000 mL | Freq: Once | INTRAMUSCULAR | Status: AC | PRN
  Administered 2012-01-16: 100 mL via INTRAVENOUS

## 2012-01-16 MED ORDER — MORPHINE SULFATE 2 MG/ML IJ SOLN
INTRAMUSCULAR | Status: AC
  Filled 2012-01-16: qty 1

## 2012-01-16 MED ORDER — DICYCLOMINE HCL 10 MG/ML IM SOLN
20.0000 mg | Freq: Once | INTRAMUSCULAR | Status: DC
  Filled 2012-01-16: qty 2

## 2012-01-16 MED ORDER — PROMETHAZINE HCL 25 MG RE SUPP: 25.0000 mg | Freq: Four times a day (QID) | RECTAL | Status: DC | PRN

## 2012-01-16 MED ORDER — OXYCODONE-ACETAMINOPHEN 5-325 MG PO TABS
ORAL_TABLET | ORAL | Status: AC
  Administered 2012-01-16: 1 via ORAL
  Filled 2012-01-16: qty 1

## 2012-01-16 MED ORDER — PROMETHAZINE HCL 25 MG PO TABS: 25.0000 mg | ORAL_TABLET | Freq: Four times a day (QID) | ORAL | Status: DC | PRN

## 2012-01-16 MED ORDER — MORPHINE SULFATE 4 MG/ML IJ SOLN
6.0000 mg | Freq: Once | INTRAMUSCULAR | Status: AC
  Administered 2012-01-16: 4 mg via INTRAVENOUS
  Filled 2012-01-16: qty 1

## 2012-01-16 MED ORDER — ONDANSETRON HCL 4 MG/2ML IJ SOLN
4.0000 mg | Freq: Once | INTRAMUSCULAR | Status: AC
  Administered 2012-01-16: 4 mg via INTRAVENOUS
  Filled 2012-01-16: qty 2

## 2012-01-16 MED ORDER — SODIUM CHLORIDE 0.9 % IV SOLN: 1000.0000 mL | INTRAVENOUS | Status: DC

## 2012-01-16 MED ORDER — ONDANSETRON HCL 4 MG PO TABS: 4.0000 mg | ORAL_TABLET | Freq: Four times a day (QID) | ORAL | Status: DC

## 2012-01-16 NOTE — ED Provider Notes (Signed)
History     CSN: [id number]  Arrival date & time 01/15/12  2332   First MD Initiated Contact with Patient 01/15/12 2358      Chief Complaint  Patient presents with  . Abdominal Pain  . Emesis    (Consider location/radiation/quality/duration/timing/severity/associated sxs/prior treatment) HPI 46 year old male presents to emergency room complaining of diffuse abdominal pain. Pain started on Saturday night. He has had nausea and vomiting. Pain worsened tonight. He has had no diarrhea, had normal bowel movement prior to arrival tonight. No fever, no sick contacts, no travel, no unusual foods.  Past Medical History  Diagnosis Date  . Cardiac arrest - ventricular fibrillation 02/11/2009    SUCCESSFUL PLACEMENT OF A [employer] JUDE MEDICAL FORTIFY SINGLE LEAD AUTOMATIC IMPLANTABLE CARDIOVERTER-DEFIBRILLATOR  . Coronary artery disease     s/p CABG  . Myocardial infarct 02/2009    NON-ST-SEGMENT ELEVATION MI  . Hypertension   . Hyperlipidemia   . Ischemic cardiomyopathy 02/09/2009    EF 40-45%  . Anoxic encephalopathy   . Marijuana abuse   . Peripheral vascular disease, unspecified   . Blood in stool   . CHF (congestive heart failure)     CHRONIC SYSTOLIC ; EF 40-45%    Past Surgical History  Procedure Date  . Circumcision   . Coronary artery bypass graft 06/17/01    X 4  . Insert / replace / remove pacemaker     IMPLANTATION AICD-ST. JUDE FORTIFY    Family History  Problem Relation Age of Onset  . Hypertension Mother   . Heart attack Father     DIED AT 10 FROM MI  . Heart disease Sister     CAD AND PREVIOUS CABG  . Hypertension      IN MOST OF HIS SIBLINGS    History  Substance Use Topics  . Smoking status: Former Smoker -- 2.0 packs/day for 20 years    Quit date: 04/04/2003  . Smokeless tobacco: Not on file  . Alcohol Use: No      Review of Systems  All other systems reviewed and are negative.    Allergies  Review of patient's allergies indicates no known  allergies.  Home Medications   Current Outpatient Rx  Name Route Sig Dispense Refill  . ASPIRIN 81 MG PO TBEC Oral Take 81 mg by mouth daily.      . ATORVASTATIN CALCIUM 80 MG PO TABS  take 1 tablet by mouth once daily 30 tablet 11  . CARVEDILOL 25 MG PO TABS  take 1 tablet by mouth twice a day 60 tablet 11  . ESOMEPRAZOLE MAGNESIUM 40 MG PO PACK Oral Take 40 mg by mouth daily before breakfast. 30 each 12  . FUROSEMIDE 20 MG PO TABS  take 1 tablet by mouth once daily 30 tablet 6  . KLOR-CON M20 20 MEQ PO TBCR  take 1 tablet by mouth once daily 30 tablet 3  . LISINOPRIL 20 MG PO TABS  take 1 tablet by mouth once daily 30 tablet 6    Refill Approved  . ONE-DAILY MULTI VITAMINS PO TABS Oral Take 1 tablet by mouth daily.        BP 110/64  Pulse 71  Temp 98.9 F (37.2 C) (Oral)  Resp 19  SpO2 100%  Physical Exam  Nursing note and vitals reviewed. Constitutional: He is oriented to person, place, and time. He appears well-developed and well-nourished.  HENT:  Head: Normocephalic and atraumatic.  Nose: Nose normal.  Mouth/Throat: Oropharynx is  clear and moist.  Eyes: Conjunctivae normal and EOM are normal. Pupils are equal, round, and reactive to light.  Neck: Normal range of motion. Neck supple. No JVD present. No tracheal deviation present. No thyromegaly present.  Cardiovascular: Normal rate, regular rhythm, normal heart sounds and intact distal pulses.  Exam reveals no gallop and no friction rub.   No murmur heard. Pulmonary/Chest: Effort normal and breath sounds normal. No stridor. No respiratory distress. He has no wheezes. He has no rales. He exhibits no tenderness.  Abdominal: Soft. Bowel sounds are normal. He exhibits no distension and no mass. There is tenderness (Diffuse tenderness). There is no rebound and no guarding.  Musculoskeletal: Normal range of motion. He exhibits no edema and no tenderness.  Lymphadenopathy:    He has no cervical adenopathy.  Neurological: He is  alert and oriented to person, place, and time. He exhibits normal muscle tone. Coordination normal.  Skin: Skin is dry. No rash noted. No erythema. No pallor.  Psychiatric: He has a normal mood and affect. His behavior is normal. Judgment and thought content normal.    ED Course  Procedures (including critical care time)  Labs Reviewed  CBC WITH DIFFERENTIAL - Abnormal; Notable for the following:    WBC 16.0 (*)     RBC 6.22 (*)     Hemoglobin 18.6 (*)     MCHC 37.3 (*)     Neutro Abs 10.8 (*)     Monocytes Absolute 1.4 (*)     All other components within normal limits  URINALYSIS, ROUTINE W REFLEX MICROSCOPIC - Abnormal; Notable for the following:    Ketones, ur 15 (*)     Leukocytes, UA TRACE (*)     All other components within normal limits  COMPREHENSIVE METABOLIC PANEL  LIPASE, BLOOD  LACTIC ACID, PLASMA  URINE MICROSCOPIC-ADD ON   Ct Abdomen Pelvis W Contrast  01/16/2012  *RADIOLOGY REPORT*  Clinical Data: Increasing abdominal pain for 3 days.  Nausea and vomiting.  CT ABDOMEN AND PELVIS WITH CONTRAST  Technique:  Multidetector CT imaging of the abdomen and pelvis was performed following the standard protocol during bolus administration of intravenous contrast.  Contrast: OMNIPAQUE IOHEXOL 300 MG/ML  SOLN  Comparison: 03/13/2009  Findings: The lung bases are clear.  Postoperative changes in the mediastinum.  Cardiac pacemaker wires.  The liver, spleen, gallbladder, pancreas, adrenal glands, kidneys, abdominal aorta, and retroperitoneal lymph nodes are unremarkable except for vascular calcifications.  The stomach, small bowel, and colon are not abnormally distended.  No appreciable wall thickening.  No free air or free fluid in the abdomen.  Pelvis:  The appendix is normal.  No evidence of diverticulitis. Prostate gland is not enlarged.  Mild bladder wall thickening is likely due to incomplete distension.  No significant pelvic lymphadenopathy.  No free or loculated pelvic  fluid collections. Degenerative changes in the lumbar spine.  IMPRESSION: No acute process demonstrated in the abdomen or pelvis.   Original Report Authenticated By: Marlon Pel, M.D.      No diagnosis found.    MDM  46 year old male with abdominal pain. Patient noted to have leukocytosis, elevated hemoglobin, with ketones slightly signifying dehydration. We'll give IV fluids. CT scan does not show specific source for pain. Most likely due to to a gastro-enteritis. Will treat pain.        Olivia Mackie, MD 01/16/12 [phone]

## 2012-02-04 ENCOUNTER — Encounter (HOSPITAL_COMMUNITY): Payer: Self-pay | Admitting: Emergency Medicine

## 2012-02-04 ENCOUNTER — Emergency Department (HOSPITAL_COMMUNITY)
Admission: EM | Admit: 2012-02-04 | Discharge: 2012-02-04 | Disposition: A | Discharge status: Home / Self Care | Payer: Medicare Other | Attending: Emergency Medicine | Admitting: Emergency Medicine

## 2012-02-04 DIAGNOSIS — K047 Periapical abscess without sinus: Secondary | ICD-10-CM | POA: Diagnosis not present

## 2012-02-04 DIAGNOSIS — Z87891 Personal history of nicotine dependence: Secondary | ICD-10-CM | POA: Diagnosis not present

## 2012-02-04 DIAGNOSIS — I2510 Atherosclerotic heart disease of native coronary artery without angina pectoris: Secondary | ICD-10-CM | POA: Diagnosis not present

## 2012-02-04 DIAGNOSIS — Z7982 Long term (current) use of aspirin: Secondary | ICD-10-CM | POA: Insufficient documentation

## 2012-02-04 DIAGNOSIS — Z951 Presence of aortocoronary bypass graft: Secondary | ICD-10-CM | POA: Insufficient documentation

## 2012-02-04 DIAGNOSIS — I509 Heart failure, unspecified: Secondary | ICD-10-CM | POA: Diagnosis not present

## 2012-02-04 DIAGNOSIS — Z8674 Personal history of sudden cardiac arrest: Secondary | ICD-10-CM | POA: Diagnosis not present

## 2012-02-04 DIAGNOSIS — I252 Old myocardial infarction: Secondary | ICD-10-CM | POA: Diagnosis not present

## 2012-02-04 DIAGNOSIS — Z8669 Personal history of other diseases of the nervous system and sense organs: Secondary | ICD-10-CM | POA: Diagnosis not present

## 2012-02-04 DIAGNOSIS — Z79899 Other long term (current) drug therapy: Secondary | ICD-10-CM | POA: Diagnosis not present

## 2012-02-04 DIAGNOSIS — I739 Peripheral vascular disease, unspecified: Secondary | ICD-10-CM | POA: Insufficient documentation

## 2012-02-04 DIAGNOSIS — E785 Hyperlipidemia, unspecified: Secondary | ICD-10-CM | POA: Insufficient documentation

## 2012-02-04 DIAGNOSIS — Z8679 Personal history of other diseases of the circulatory system: Secondary | ICD-10-CM | POA: Insufficient documentation

## 2012-02-04 DIAGNOSIS — I10 Essential (primary) hypertension: Secondary | ICD-10-CM | POA: Insufficient documentation

## 2012-02-04 DIAGNOSIS — Z8719 Personal history of other diseases of the digestive system: Secondary | ICD-10-CM | POA: Insufficient documentation

## 2012-02-04 DIAGNOSIS — Z95810 Presence of automatic (implantable) cardiac defibrillator: Secondary | ICD-10-CM | POA: Diagnosis not present

## 2012-02-04 MED ORDER — OXYCODONE-ACETAMINOPHEN 5-325 MG PO TABS
1.0000 | ORAL_TABLET | Freq: Once | ORAL | Status: AC
  Administered 2012-02-04: 1 via ORAL
  Filled 2012-02-04: qty 1

## 2012-02-04 MED ORDER — OXYCODONE-ACETAMINOPHEN 5-325 MG PO TABS: 1.0000 | ORAL_TABLET | ORAL | Status: DC | PRN

## 2012-02-04 MED ORDER — LIDOCAINE-EPINEPHRINE 2 %-1:100000 IJ SOLN
1.0000 mL | Freq: Once | INTRAMUSCULAR | Status: AC
  Administered 2012-02-04: 1 mL via INTRADERMAL

## 2012-02-04 MED ORDER — PENICILLIN V POTASSIUM 500 MG PO TABS: 500.0000 mg | ORAL_TABLET | Freq: Three times a day (TID) | ORAL | Status: DC

## 2012-02-04 MED ORDER — PENICILLIN V POTASSIUM 500 MG PO TABS
500.0000 mg | ORAL_TABLET | Freq: Once | ORAL | Status: AC
  Administered 2012-02-04: 500 mg via ORAL
  Filled 2012-02-04: qty 1

## 2012-02-04 NOTE — ED Notes (Signed)
Pt presents w/ cough type sx x one week. Has some nasal congestion and minor sore throat, no Rx w/ OTC. Also has swelling to upper lip, states recently saw dentist who indicates he has a bad tooth, front left incisor and the bone is starting to deteriorate.

## 2012-02-04 NOTE — ED Provider Notes (Addendum)
History     CSN: [id number]  Arrival date & time 02/04/12  4098   First MD Initiated Contact with Patient 02/04/12 [phone]      Chief Complaint  Patient presents with  . Dental Problem  . Nasal Congestion    (Consider location/radiation/quality/duration/timing/severity/associated sxs/prior treatment) Patient is a 46 y.o. male presenting with tooth pain. The history is provided by the patient.  Dental PainThe primary symptoms include mouth pain. Primary symptoms do not include fever.  Additional symptoms include: facial swelling. Associated symptoms comments: Painful swelling to upper lip and sore tooth that has caused abscess in the past. .    Past Medical History  Diagnosis Date  . Cardiac arrest - ventricular fibrillation 02/11/2009    SUCCESSFUL PLACEMENT OF A [employer] JUDE MEDICAL FORTIFY SINGLE LEAD AUTOMATIC IMPLANTABLE CARDIOVERTER-DEFIBRILLATOR  . Coronary artery disease     s/p CABG  . Myocardial infarct 02/2009    NON-ST-SEGMENT ELEVATION MI  . Hypertension   . Hyperlipidemia   . Ischemic cardiomyopathy 02/09/2009    EF 40-45%  . Anoxic encephalopathy   . Marijuana abuse   . Peripheral vascular disease, unspecified   . Blood in stool   . CHF (congestive heart failure)     CHRONIC SYSTOLIC ; EF 40-45%    Past Surgical History  Procedure Date  . Circumcision   . Coronary artery bypass graft 06/17/01    X 4  . Insert / replace / remove pacemaker     IMPLANTATION AICD-ST. JUDE FORTIFY  . Cardiac surgery     Family History  Problem Relation Age of Onset  . Hypertension Mother   . Heart attack Father     DIED AT 8 FROM MI  . Heart disease Sister     CAD AND PREVIOUS CABG  . Hypertension      IN MOST OF HIS SIBLINGS    History  Substance Use Topics  . Smoking status: Former Smoker -- 2.0 packs/day for 20 years    Quit date: 04/04/2003  . Smokeless tobacco: Never Used  . Alcohol Use: No      Review of Systems  Constitutional: Negative for fever and  chills.  HENT: Positive for facial swelling and dental problem.     Allergies  Review of patient's allergies indicates no known allergies.  Home Medications   Current Outpatient Rx  Name  Route  Sig  Dispense  Refill  . ASPIRIN 81 MG PO TBEC   Oral   Take 81 mg by mouth daily.           . ATORVASTATIN CALCIUM 80 MG PO TABS      take 1 tablet by mouth once daily   30 tablet   11   . CARVEDILOL 25 MG PO TABS      take 1 tablet by mouth twice a day   60 tablet   11   . ESOMEPRAZOLE MAGNESIUM 40 MG PO PACK   Oral   Take 40 mg by mouth daily before breakfast.   30 each   12   . FUROSEMIDE 20 MG PO TABS      take 1 tablet by mouth once daily   30 tablet   6   . KLOR-CON M20 20 MEQ PO TBCR      take 1 tablet by mouth once daily   30 tablet   3   . LISINOPRIL 20 MG PO TABS      take 1 tablet by mouth once daily  30 tablet   6     Refill Approved   . ONE-DAILY MULTI VITAMINS PO TABS   Oral   Take 1 tablet by mouth daily.           [name] ONDANSETRON HCL 4 MG PO TABS   Oral   Take 1 tablet (4 mg total) by mouth every 6 (six) hours.   12 tablet   0   . PROMETHAZINE HCL 25 MG RE SUPP   Rectal   Place 1 suppository (25 mg total) rectally every 6 (six) hours as needed for nausea.   12 each   0   . PROMETHAZINE HCL 25 MG PO TABS   Oral   Take 1 tablet (25 mg total) by mouth every 6 (six) hours as needed for nausea.   30 tablet   0     BP 129/71  Pulse 68  Temp 97.9 F (36.6 C) (Oral)  Resp 18  SpO2 98%  Physical Exam  Constitutional: He appears well-developed and well-nourished. No distress.  HENT:  Mouth/Throat: Oropharynx is clear and moist.       Upper left incisor decayed. There is a pointing abscess visible to buccal surface adjacent to tooth with coincident external swelling of lip. No active drainage.   Neck: Normal range of motion.  Pulmonary/Chest: Effort normal.  Lymphadenopathy:    He has no cervical adenopathy.    ED Course    Procedures (including critical care time) INCISION AND DRAINAGE Performed by: Langley Adie A Consent: Verbal consent obtained. Risks and benefits: risks, benefits and alternatives were discussed Type: abscess  Body area: upper incisor dental abscess  Anesthesia: local infiltration  Local anesthetic: lidocaine 1% w/ epinephrine  Anesthetic total: 0.5 ml  Complexity: complex Scalpel used to incise abscess   Drainage: purulent  Drainage amount: moderate  Packing material: No packing placed   Patient tolerance: Patient tolerated the procedure well with no immediate complications.    Labs Reviewed - No data to display No results found.   No diagnosis found.  1. Dental abscess  MDM  Opened dental abscess. Patient placed on abx and has dental follow up in community.        Rodena Medin, PA-C 02/04/12 4098  Rodena Medin, PA-C 02/21/12 1155

## 2012-02-05 NOTE — ED Provider Notes (Signed)
Medical screening examination/treatment/procedure(s) were performed by non-physician practitioner and as supervising physician I was immediately available for consultation/collaboration.   Suzi Roots, MD 02/05/12 [phone]

## 2012-02-26 NOTE — ED Provider Notes (Signed)
Medical screening examination/treatment/procedure(s) were performed by non-physician practitioner and as supervising physician I was immediately available for consultation/collaboration.   Suzi Roots, MD 02/26/12 [phone]

## 2012-03-09 ENCOUNTER — Other Ambulatory Visit: Payer: Self-pay | Admitting: Cardiology

## 2012-03-20 ENCOUNTER — Telehealth: Payer: Self-pay | Admitting: Cardiology

## 2012-03-20 NOTE — Telephone Encounter (Signed)
New Problem:     I called the mobile number listed for the patient and it turned out to be the home phone number to patient's mother Keary Waterson.  After speaking with the Ms. Seedorf and leaving our office number, I was assured that she would pass word to her son to call back and schedule an appointment

## 2012-04-12 ENCOUNTER — Encounter: Payer: Self-pay | Admitting: Cardiology

## 2012-04-12 ENCOUNTER — Ambulatory Visit (INDEPENDENT_AMBULATORY_CARE_PROVIDER_SITE_OTHER): Payer: Medicare Other | Admitting: Cardiology

## 2012-04-12 ENCOUNTER — Encounter: Payer: Self-pay | Admitting: *Deleted

## 2012-04-12 VITALS — BP 126/74 | HR 68 | Ht 71.5 in | Wt 229.0 lb

## 2012-04-12 DIAGNOSIS — E785 Hyperlipidemia, unspecified: Secondary | ICD-10-CM | POA: Diagnosis not present

## 2012-04-12 DIAGNOSIS — I2510 Atherosclerotic heart disease of native coronary artery without angina pectoris: Secondary | ICD-10-CM | POA: Diagnosis not present

## 2012-04-12 DIAGNOSIS — Z95810 Presence of automatic (implantable) cardiac defibrillator: Secondary | ICD-10-CM

## 2012-04-12 DIAGNOSIS — I2589 Other forms of chronic ischemic heart disease: Secondary | ICD-10-CM

## 2012-04-12 DIAGNOSIS — I10 Essential (primary) hypertension: Secondary | ICD-10-CM

## 2012-04-12 LAB — LIPID PANEL
HDL: 34.2 mg/dL — ABNORMAL LOW (ref >39.00)
LDL Cholesterol: 94 mg/dL (ref 0–99)
Total CHOL/HDL Ratio: 4
VLDL: 13.6 mg/dL (ref 0.0–40.0)

## 2012-04-12 MED ORDER — FUROSEMIDE 20 MG PO TABS: 20.0000 mg | ORAL_TABLET | Freq: Every day | ORAL | Status: DC

## 2012-04-12 MED ORDER — POTASSIUM CHLORIDE CRYS ER 20 MEQ PO TBCR: 20.0000 meq | EXTENDED_RELEASE_TABLET | Freq: Every day | ORAL | Status: DC

## 2012-04-12 NOTE — Assessment & Plan Note (Signed)
Blood pressure controlled. Continue present medications. Recent potassium and renal function normal.

## 2012-04-12 NOTE — Assessment & Plan Note (Signed)
Continue statin. Check lipids. Recent liver functions normal. 

## 2012-04-12 NOTE — Patient Instructions (Addendum)
Your physician wants you to follow-up in: 6 MONTHS WITH DR CRENSHAW You will receive a reminder letter in the mail two months in advance. If you don't receive a letter, please call our office to schedule the follow-up appointment.   Your physician has requested that you have a lexiscan myoview. For further information please visit [email or web address]. Please follow instruction sheet, as given.   Your physician recommends that you HAVE LAB WORK TODAY 

## 2012-04-12 NOTE — Assessment & Plan Note (Signed)
Continue aspirin and statin. Schedule followup Lexiscan Myoview for risk stratification.

## 2012-04-12 NOTE — Assessment & Plan Note (Signed)
Management per electrophysiology. 

## 2012-04-12 NOTE — Progress Notes (Signed)
HPI: Pleasant male for FU of CAD. Patient suffered a cardiac arrest in November of 2010. Cardiac catheterization at that time revealed an ejection fraction of 35%. Normal left main. The LAD had scattered 20-30% lesions. The first diagonal was occluded. The left circumflex had scattered 30-40% lesions and then occluded following the OM. The OM is stented, and the stent is widely patent without significant stenosis. The second OM and distal circumflex fill from left-to-left collaterals. The right coronary artery was known to be totally occluded in its proximal segment. The distal right coronary artery branches fill from left-to-right collaterals. The saphenous vein graft to first diagonal is widely patent. There is mild 40-50% stenosis of the diagonal just after the graft insertion site. Left internal mammary artery to LAD is patent. Saphenous vein graft to distal right coronary artery is known to be occluded. The saphenous vein graft to OM branch was totally occluded. This is a chronic occlusion as well. Echocardiogram initially revealed an EF of 10-15% however followup echo showed an EF of 40-45%, mild biatrial enlargement and mild mitral regurgitation. Given out of the hospital VF arrest a [employer] Jude single lead ICD was placed. I last saw him in March of 2013. Note renal function and liver functions were normal in October 2013. Since then, the patient denies any dyspnea on exertion, orthopnea, PND, pedal edema, palpitations, syncope. He has occasional brief chest pain not associated with activities. It is described as an ache or burning sensation. Last several minutes and resolves spontaneously. No exertional chest pain.   Current Outpatient Prescriptions  Medication Sig Dispense Refill  . aspirin 81 MG EC tablet Take 81 mg by mouth daily.        [name] atorvastatin (LIPITOR) 80 MG tablet take 1 tablet by mouth once daily  30 tablet  11  . carvedilol (COREG) 25 MG tablet take 1 tablet by mouth twice a day  60  tablet  11  . esomeprazole (NEXIUM) 40 MG packet Take 40 mg by mouth daily before breakfast.  30 each  12  . furosemide (LASIX) 20 MG tablet take 1 tablet by mouth once daily  30 tablet  6  . KLOR-CON M20 20 MEQ tablet take 1 tablet by mouth once daily  30 tablet  3  . lisinopril (PRINIVIL,ZESTRIL) 20 MG tablet take 1 tablet by mouth once daily  30 tablet  6  . Multiple Vitamin (MULTIVITAMIN) tablet Take 1 tablet by mouth daily.        [name] NEXIUM 40 MG capsule take 1 capsule by mouth once daily  30 each  0  . oxyCODONE-acetaminophen (PERCOCET/ROXICET) 5-325 MG per tablet Take 1 tablet by mouth every 4 (four) hours as needed for pain.  12 tablet  0  . penicillin v potassium (VEETID) 500 MG tablet Take 1 tablet (500 mg total) by mouth 3 (three) times daily.  30 tablet  0     Past Medical History  Diagnosis Date  . Cardiac arrest - ventricular fibrillation 02/11/2009    SUCCESSFUL PLACEMENT OF A [employer] JUDE MEDICAL FORTIFY SINGLE LEAD AUTOMATIC IMPLANTABLE CARDIOVERTER-DEFIBRILLATOR  . Coronary artery disease     s/p CABG  . Myocardial infarct 02/2009    NON-ST-SEGMENT ELEVATION MI  . Hypertension   . Hyperlipidemia   . Ischemic cardiomyopathy 02/09/2009    EF 40-45%  . Anoxic encephalopathy   . Marijuana abuse   . Peripheral vascular disease, unspecified   . Blood in stool   . CHF (congestive heart  failure)     CHRONIC SYSTOLIC ; EF 40-45%    Past Surgical History  Procedure Date  . Circumcision   . Coronary artery bypass graft 06/17/01    X 4  . Insert / replace / remove pacemaker     IMPLANTATION AICD-ST. JUDE FORTIFY  . Cardiac surgery     History   Social History  . Marital Status: Married    Spouse Name: N/A    Number of Children: N/A  . Years of Education: N/A   Occupational History  . Not on file.   Social History Main Topics  . Smoking status: Former Smoker -- 2.0 packs/day for 20 years    Quit date: 04/04/2003  . Smokeless tobacco: Never Used  . Alcohol Use:  No  . Drug Use: No  . Sexually Active:    Other Topics Concern  . Not on file   Social History Narrative   MARRIEDFORMER TOBACCO USE, SMOKED FOR 20 YRS 2 PPD; QUIT IN 43NO ETOHNO ILLICIT DRUG USENO REGULAR EXERCISEICD-ST. JUDE ; CERTIFIED LETTER SENT AND RETURNED BAD ADDRESS, PLS UPDATE DJW.    ROS: no fevers or chills, productive cough, hemoptysis, dysphasia, odynophagia, melena, hematochezia, dysuria, hematuria, rash, seizure activity, orthopnea, PND, pedal edema, claudication. Remaining systems are negative.  Physical Exam: Well-developed well-nourished in no acute distress.  Skin is warm and dry.  HEENT is normal.  Neck is supple.  Chest is clear to auscultation with normal expansion.  Cardiovascular exam is regular rate and rhythm.  Abdominal exam nontender or distended. No masses palpated. Extremities show no edema. neuro grossly intact  ECG sinus rhythm at a rate of 62. No ST changes.

## 2012-04-12 NOTE — Assessment & Plan Note (Signed)
Continue ACE inhibitor and beta blocker. 

## 2012-04-12 NOTE — Addendum Note (Signed)
Addended by: Freddi Starr on: 04/12/2012 10:16 AM   Modules accepted: Orders

## 2012-04-16 ENCOUNTER — Encounter: Payer: Self-pay | Admitting: Cardiology

## 2012-04-18 ENCOUNTER — Ambulatory Visit (HOSPITAL_COMMUNITY): Payer: Medicare Other | Attending: Cardiology | Admitting: Radiology

## 2012-04-18 VITALS — Ht 71.0 in | Wt 229.0 lb

## 2012-04-18 DIAGNOSIS — R079 Chest pain, unspecified: Secondary | ICD-10-CM | POA: Diagnosis not present

## 2012-04-18 DIAGNOSIS — I2510 Atherosclerotic heart disease of native coronary artery without angina pectoris: Secondary | ICD-10-CM | POA: Diagnosis not present

## 2012-04-18 DIAGNOSIS — I739 Peripheral vascular disease, unspecified: Secondary | ICD-10-CM | POA: Diagnosis not present

## 2012-04-18 DIAGNOSIS — I10 Essential (primary) hypertension: Secondary | ICD-10-CM | POA: Diagnosis not present

## 2012-04-18 MED ORDER — TECHNETIUM TC 99M SESTAMIBI GENERIC - CARDIOLITE
30.0000 | Freq: Once | INTRAVENOUS | Status: AC | PRN
  Administered 2012-04-18: 30 via INTRAVENOUS

## 2012-04-18 MED ORDER — REGADENOSON 0.4 MG/5ML IV SOLN
0.4000 mg | Freq: Once | INTRAVENOUS | Status: AC
  Administered 2012-04-18: 0.4 mg via INTRAVENOUS

## 2012-04-18 MED ORDER — TECHNETIUM TC 99M SESTAMIBI GENERIC - CARDIOLITE
10.0000 | Freq: Once | INTRAVENOUS | Status: AC | PRN
  Administered 2012-04-18: 10 via INTRAVENOUS

## 2012-04-18 NOTE — Progress Notes (Signed)
[facility] SITE 3 NUCLEAR MED [address] [place], [place] [address] [phone]    Cardiology Nuclear Med Billy [name] is a 47 y.o. male     MRN : [id number]     DOB: 17-Nov-1965  Procedure Date: 04/18/2012  Nuclear Med Background Indication for Stress Test:  Evaluation for Ischemia, Graft Patency and Stent Patency History: MI, ICM, '03 CABG x 4, '09 Myocardial Perfusion Study-inferolateral scar with peri-infarct ischemia, exercised induced VT, EF=35%> Stent Om-1,02-2009 OOH- VF> Heart Catheterization-2/4 grafts patent, SVG> RCA and OM occluded, patent OM stent, stable/no change from previous cath, EF=35%, and 02-2009 Echo: EF=40-45% Cardiac Risk Factors: Family History - CAD, History of Smoking, Hypertension, Lipids and PVD  Symptoms:  Chest Pain without exertion (last occurrence 2 days ago),    Nuclear Pre-Procedure Caffeine/Decaff Intake:  None NPO After: 8:30pm   Lungs:  Coarse breath sounds, no wheezing O2 Sat: 96% on room air. IV 0.9% NS with Angio Cath:  20g  IV Site: R Hand  IV Started by:  Bonnita Levan, RN  Chest Size (in):  48 Cup Size: n/a  Height: 5\' 11"  (1.803 m)  Weight:  229 lb (103.874 kg)  BMI:  Body mass index is 31.94 kg/(m^2). Tech Comments:  Patient took all meds this AM    Nuclear Med Study 1 or 2 day study: 1 day  Stress Test Type:  Eugenie Birks  Reading MD: Marca Ancona, MD  Order Authorizing Provider:  Olga Millers, MD  Resting Radionuclide: Technetium 57m Sestamibi  Resting Radionuclide Dose: 11.0 mCi   Stress Radionuclide:  Technetium 44m Sestamibi  Stress Radionuclide Dose: 33.0 mCi           Stress Protocol Rest HR: 53 Stress HR: 75  Rest BP: 109/55 Stress BP: 98/73  Exercise Time (min): n/a METS: n/a   Predicted Max HR: 175 bpm % Max HR: 42.86 bpm Rate Pressure Product: 7350    Dose of Adenosine (mg):  n/a Dose of Lexiscan: 0.4 mg  Dose of Atropine (mg): n/a Dose of Dobutamine: n/a mcg/kg/min (at max HR)  Stress  Test Technologist: Irean Hong, RN  Nuclear Technologist:  Domenic Polite, CNMT     Rest Procedure:  Myocardial perfusion imaging was performed at rest 45 minutes following the intravenous administration of Technetium 63m Sestamibi. Rest ECG: NSR - Normal EKG  Stress Procedure:  The patient received IV Lexiscan 0.4 mg over 15-seconds.  Technetium 64m Sestamibi injected at 30-seconds.There was a hypotensive response to lexiscan that subsided after trendelenburg position and IV fluids of 0.9% NACL, 250cc infused.  Quantitative spect images were obtained after a 45 minute delay. Stress ECG: No significant change from baseline ECG  QPS Raw Data Images:  Normal; no motion artifact; normal heart/lung ratio. Stress Images:  Medium-sized, severe perfusion defect involving the basal to mid inferolateral and the basal inferior wall segments. Rest Images:  Perfusion defect of similar extent to the stress images but less intense.  Subtraction (SDS):  Medium-sized, severe partially reversible basal to mid inferolateral and basal inferior perfusion defect.  Transient Ischemic Dilatation (Normal <1.22):  1.00 Lung/Heart Ratio (Normal <0.45):  0.41  Quantitative Gated Spect Images QGS EDV:  218 ml QGS ESV:  135 ml  Impression Exercise Capacity:  Lexiscan with no exercise. BP Response:  Hypotensive blood pressure response. Clinical Symptoms:  Chest tightness, nausea, dizziness.  ECG Impression:  No significant ST segment change suggestive of ischemia. Comparison with Prior Nuclear Study: Similar to report of previous  study.  Overall Impression:  Intermediate stress nuclear study.  Medium-sized, severe partially reversible basal to mid inferolateral and basal inferior perfusion defect suggestive of infarction with peri-infarct ischemia.   LV Ejection Fraction: 38%.  LV Wall Motion:  Inferolateral hypokinesis.   Marca Ancona 04/18/2012

## 2012-05-02 ENCOUNTER — Other Ambulatory Visit: Payer: Self-pay | Admitting: Cardiology

## 2012-05-27 ENCOUNTER — Other Ambulatory Visit: Payer: Self-pay | Admitting: Cardiology

## 2012-05-30 ENCOUNTER — Emergency Department (HOSPITAL_COMMUNITY): Payer: Medicare Other

## 2012-05-30 ENCOUNTER — Emergency Department (HOSPITAL_COMMUNITY)
Admission: EM | Admit: 2012-05-30 | Discharge: 2012-05-30 | Disposition: A | Discharge status: Home / Self Care | Payer: Medicare Other | Attending: Emergency Medicine | Admitting: Emergency Medicine

## 2012-05-30 ENCOUNTER — Encounter (HOSPITAL_COMMUNITY): Payer: Self-pay | Admitting: Emergency Medicine

## 2012-05-30 DIAGNOSIS — Z7982 Long term (current) use of aspirin: Secondary | ICD-10-CM | POA: Diagnosis not present

## 2012-05-30 DIAGNOSIS — Z87891 Personal history of nicotine dependence: Secondary | ICD-10-CM | POA: Diagnosis not present

## 2012-05-30 DIAGNOSIS — K921 Melena: Secondary | ICD-10-CM | POA: Diagnosis not present

## 2012-05-30 DIAGNOSIS — Z79899 Other long term (current) drug therapy: Secondary | ICD-10-CM | POA: Diagnosis not present

## 2012-05-30 DIAGNOSIS — E785 Hyperlipidemia, unspecified: Secondary | ICD-10-CM | POA: Insufficient documentation

## 2012-05-30 DIAGNOSIS — R1013 Epigastric pain: Secondary | ICD-10-CM | POA: Insufficient documentation

## 2012-05-30 DIAGNOSIS — I2510 Atherosclerotic heart disease of native coronary artery without angina pectoris: Secondary | ICD-10-CM | POA: Diagnosis not present

## 2012-05-30 DIAGNOSIS — I252 Old myocardial infarction: Secondary | ICD-10-CM | POA: Insufficient documentation

## 2012-05-30 DIAGNOSIS — Z951 Presence of aortocoronary bypass graft: Secondary | ICD-10-CM | POA: Diagnosis not present

## 2012-05-30 DIAGNOSIS — Z8669 Personal history of other diseases of the nervous system and sense organs: Secondary | ICD-10-CM | POA: Insufficient documentation

## 2012-05-30 DIAGNOSIS — I5022 Chronic systolic (congestive) heart failure: Secondary | ICD-10-CM | POA: Diagnosis not present

## 2012-05-30 DIAGNOSIS — Z8679 Personal history of other diseases of the circulatory system: Secondary | ICD-10-CM | POA: Diagnosis not present

## 2012-05-30 DIAGNOSIS — R112 Nausea with vomiting, unspecified: Secondary | ICD-10-CM | POA: Insufficient documentation

## 2012-05-30 DIAGNOSIS — R1110 Vomiting, unspecified: Secondary | ICD-10-CM | POA: Diagnosis not present

## 2012-05-30 DIAGNOSIS — R109 Unspecified abdominal pain: Secondary | ICD-10-CM | POA: Diagnosis not present

## 2012-05-30 DIAGNOSIS — E860 Dehydration: Secondary | ICD-10-CM | POA: Insufficient documentation

## 2012-05-30 DIAGNOSIS — Z8674 Personal history of sudden cardiac arrest: Secondary | ICD-10-CM | POA: Insufficient documentation

## 2012-05-30 DIAGNOSIS — I10 Essential (primary) hypertension: Secondary | ICD-10-CM | POA: Diagnosis not present

## 2012-05-30 LAB — CBC WITH DIFFERENTIAL/PLATELET
Basophils Absolute: 0 10*3/uL (ref 0.0–0.1)
Eosinophils Relative: 2 % (ref 0–5)
Lymphocytes Relative: 21 % (ref 12–46)
MCV: 80.5 fL (ref 78.0–100.0)
Platelets: 164 10*3/uL (ref 150–400)
RDW: 14.1 % (ref 11.5–15.5)
WBC: 16.3 10*3/uL — ABNORMAL HIGH (ref 4.0–10.5)

## 2012-05-30 LAB — URINALYSIS, ROUTINE W REFLEX MICROSCOPIC
Bilirubin Urine: NEGATIVE
Glucose, UA: NEGATIVE mg/dL
Hgb urine dipstick: NEGATIVE
Specific Gravity, Urine: >1.046 — ABNORMAL HIGH (ref 1.005–1.030)
pH: 8.5 — ABNORMAL HIGH (ref 5.0–8.0)

## 2012-05-30 LAB — COMPREHENSIVE METABOLIC PANEL
ALT: 18 U/L (ref 0–53)
AST: 20 U/L (ref 0–37)
CO2: 26 mEq/L (ref 19–32)
Calcium: 9.8 mg/dL (ref 8.4–10.5)
GFR calc non Af Amer: 78 mL/min — ABNORMAL LOW (ref >90)
Sodium: 138 mEq/L (ref 135–145)
Total Protein: 7.9 g/dL (ref 6.0–8.3)

## 2012-05-30 LAB — LIPASE, BLOOD: Lipase: 15 U/L (ref 11–59)

## 2012-05-30 MED ORDER — IOHEXOL 300 MG/ML  SOLN
50.0000 mL | Freq: Once | INTRAMUSCULAR | Status: AC | PRN
  Administered 2012-05-30: 50 mL via ORAL

## 2012-05-30 MED ORDER — IOHEXOL 300 MG/ML  SOLN: 50.0000 mL | Freq: Once | INTRAMUSCULAR | Status: DC | PRN

## 2012-05-30 MED ORDER — ONDANSETRON HCL 4 MG/2ML IJ SOLN
4.0000 mg | Freq: Once | INTRAMUSCULAR | Status: AC
  Administered 2012-05-30: 4 mg via INTRAVENOUS
  Filled 2012-05-30: qty 2

## 2012-05-30 MED ORDER — SODIUM CHLORIDE 0.9 % IV BOLUS (SEPSIS)
1000.0000 mL | Freq: Once | INTRAVENOUS | Status: AC
  Administered 2012-05-30: 1000 mL via INTRAVENOUS

## 2012-05-30 MED ORDER — PROMETHAZINE HCL 25 MG PO TABS: 25.0000 mg | ORAL_TABLET | Freq: Four times a day (QID) | ORAL | Status: DC | PRN

## 2012-05-30 MED ORDER — HYDROCODONE-ACETAMINOPHEN 5-325 MG PO TABS: 1.0000 | ORAL_TABLET | Freq: Four times a day (QID) | ORAL | Status: DC | PRN

## 2012-05-30 MED ORDER — IOHEXOL 300 MG/ML  SOLN
100.0000 mL | Freq: Once | INTRAMUSCULAR | Status: AC | PRN
  Administered 2012-05-30: 100 mL via INTRAVENOUS

## 2012-05-30 MED ORDER — HYDROMORPHONE HCL PF 1 MG/ML IJ SOLN
1.0000 mg | Freq: Once | INTRAMUSCULAR | Status: AC
  Administered 2012-05-30: 1 mg via INTRAMUSCULAR
  Filled 2012-05-30: qty 1

## 2012-05-30 NOTE — ED Notes (Signed)
Patient c/o epigastric pain and vomiting since about 1100 this morning.  Patient denies chest pain, fevers, and diarrhea.

## 2012-05-30 NOTE — ED Provider Notes (Signed)
History     CSN: [id number]  Arrival date & time 05/30/12  1715   First MD Initiated Contact with Patient 05/30/12 1719      Chief Complaint  Patient presents with  . Emesis  . Abdominal Pain    (Consider location/radiation/quality/duration/timing/severity/associated sxs/prior treatment) HPI Patient is a 47 yo male with a history of heart disease who presents with abdominal pain and emesis since 11 am this morning.  Happened all of a sudden and he states the pain is all over but mostly in the epigastric area.  He can't describe the pain.  Denies any fever, diarrhea, constipation, hempotysis, chest pain or shortness of breath.  Does have hematochezia but states this is a chronic issue and he's talked to his PCP about this.    Past Medical History  Diagnosis Date  . Cardiac arrest - ventricular fibrillation 02/11/2009    SUCCESSFUL PLACEMENT OF A ST. JUDE MEDICAL FORTIFY SINGLE LEAD AUTOMATIC IMPLANTABLE CARDIOVERTER-DEFIBRILLATOR  . Coronary artery disease     s/p CABG  . Myocardial infarct 02/2009    NON-ST-SEGMENT ELEVATION MI  . Hypertension   . Hyperlipidemia   . Ischemic cardiomyopathy 02/09/2009    EF 40-45%  . Anoxic encephalopathy   . Marijuana abuse   . Peripheral vascular disease, unspecified   . Blood in stool   . CHF (congestive heart failure)     CHRONIC SYSTOLIC ; EF 40-45%    Past Surgical History  Procedure Laterality Date  . Circumcision    . Coronary artery bypass graft  06/17/01    X 4  . Insert / replace / remove pacemaker      IMPLANTATION AICD-ST. JUDE FORTIFY  . Cardiac surgery      Family History  Problem Relation Age of Onset  . Hypertension Mother   . Heart attack Father     DIED AT 47 FROM MI  . Heart disease Sister     CAD AND PREVIOUS CABG  . Hypertension      IN MOST OF HIS SIBLINGS    History  Substance Use Topics  . Smoking status: Former Smoker -- 2.00 packs/day for 20 years    Quit date: 04/04/2003  . Smokeless tobacco:  Never Used  . Alcohol Use: No      Review of Systems All other systems negative except as documented in the HPI. All pertinent positives and negatives as reviewed in the HPI.  Allergies  Review of patient's allergies indicates no known allergies.  Home Medications   Current Outpatient Rx  Name  Route  Sig  Dispense  Refill  . aspirin 81 MG EC tablet   Oral   Take 81 mg by mouth every morning.          [name] atorvastatin (LIPITOR) 80 MG tablet   Oral   Take 80 mg by mouth daily.         . carvedilol (COREG) 25 MG tablet   Oral   Take 25 mg by mouth 2 (two) times daily with a meal.         . esomeprazole (NEXIUM) 40 MG packet   Oral   Take 40 mg by mouth daily before breakfast.         . furosemide (LASIX) 20 MG tablet   Oral   Take 20 mg by mouth every morning.         [name] lisinopril (PRINIVIL,ZESTRIL) 20 MG tablet   Oral   Take 20 mg by mouth daily.         [name]  Multiple Vitamin (MULTIVITAMIN) tablet   Oral   Take 1 tablet by mouth every morning.          . potassium chloride SA (K-DUR,KLOR-CON) 20 MEQ tablet   Oral   Take 20 mEq by mouth every morning.           BP 121/65  Pulse 58  Temp(Src) 97.9 F (36.6 C)  Resp 16  SpO2 100%  Physical Exam  Nursing note and vitals reviewed. Constitutional: He is oriented to person, place, and time. He appears well-developed and well-nourished. No distress.  HENT:  Head: Normocephalic and atraumatic.  Right Ear: External ear normal.  Left Ear: External ear normal.  Eyes: Right eye exhibits no discharge. Left eye exhibits no discharge. No scleral icterus.  Neck: Normal range of motion.  Cardiovascular: Normal rate and regular rhythm.  Exam reveals no gallop and no friction rub.   No murmur heard. Pulmonary/Chest: Effort normal and breath sounds normal. No respiratory distress. He has no wheezes. He has no rales. He exhibits no tenderness.  Abdominal: Soft. Bowel sounds are normal. There is tenderness in  the epigastric area.    Neurological: He is alert and oriented to person, place, and time.  Skin: Skin is warm and dry. No rash noted. He is not diaphoretic. No erythema. No pallor.  Psychiatric: He has a normal mood and affect. His behavior is normal. Judgment and thought content normal.    ED Course  Procedures (including critical care time)  Labs Reviewed  CBC WITH DIFFERENTIAL  COMPREHENSIVE METABOLIC PANEL  URINALYSIS, ROUTINE W REFLEX MICROSCOPIC  TROPONIN I  LIPASE, BLOOD   7:00 PM The patient is feeling vastly improved and is tolerating oral fluids at this time. The patient states that he has had no chest pain or SOB. Patient has dehydration noted on testing.   MDM  MDM Reviewed: vitals, nursing note and previous chart Reviewed previous: labs Interpretation: labs, x-ray and CT scan            Carlyle Dolly, PA-C 05/30/12 2002

## 2012-05-30 NOTE — ED Notes (Signed)
Patient transported to CT 

## 2012-05-30 NOTE — ED Notes (Addendum)
Patient transported to XR. 

## 2012-05-31 ENCOUNTER — Ambulatory Visit (INDEPENDENT_AMBULATORY_CARE_PROVIDER_SITE_OTHER): Payer: Medicare Other | Admitting: Internal Medicine

## 2012-05-31 ENCOUNTER — Encounter: Payer: Self-pay | Admitting: Internal Medicine

## 2012-05-31 VITALS — BP 111/77 | HR 73 | Ht 71.0 in | Wt 226.2 lb

## 2012-05-31 DIAGNOSIS — Z95810 Presence of automatic (implantable) cardiac defibrillator: Secondary | ICD-10-CM | POA: Diagnosis not present

## 2012-05-31 DIAGNOSIS — I2589 Other forms of chronic ischemic heart disease: Secondary | ICD-10-CM | POA: Diagnosis not present

## 2012-05-31 DIAGNOSIS — I4901 Ventricular fibrillation: Secondary | ICD-10-CM | POA: Diagnosis not present

## 2012-05-31 LAB — ICD DEVICE OBSERVATION
DEV-0020ICD: NEGATIVE
HV IMPEDENCE: 52 Ohm
RV LEAD AMPLITUDE: 12 mv
RV LEAD IMPEDENCE ICD: 525 Ohm
RV LEAD THRESHOLD: 0.75 V
TOT-0009: 1
TOT-0010: 12
TZAT-0001SLOWVT: 1
TZAT-0012SLOWVT: 200 ms
TZAT-0019SLOWVT: 7.5 V
TZAT-0020SLOWVT: 1 ms
TZON-0004SLOWVT: 50
TZON-0005SLOWVT: 6
TZST-0001SLOWVT: 2
TZST-0001SLOWVT: 4
TZST-0003SLOWVT: 650 V
TZST-0003SLOWVT: 845 V
TZST-0003SLOWVT: 890 V

## 2012-05-31 NOTE — Assessment & Plan Note (Signed)
He was in the emergency room yesterday for "dehydration". This I do think that he may well be a candidate however, for hydralazine/nitrate therapy based on the A-HEFT trial  I will review this with Dr. Jens Som

## 2012-05-31 NOTE — Progress Notes (Signed)
Patient has no care team.   HPI  Billy [name] is a 47 y.o. male Seen in followup for an ICD implanted following a cardiac arrest fall 2011. His history of ischemic heart disease and prior bypass grafting and had declined ICD implantation prior to his arrest.  A recent Myoview demonstrating ejection fraction 38% with inferolateral hypokinesis   The patient denies chest pain, shortness of breath, nocturnal dyspnea, orthopnea or peripheral edema.  There have been no palpitations, lightheadedness or syncope.    Past Medical History  Diagnosis Date  . Cardiac arrest - ventricular fibrillation 02/11/2009    SUCCESSFUL PLACEMENT OF A ST. JUDE MEDICAL FORTIFY SINGLE LEAD AUTOMATIC IMPLANTABLE CARDIOVERTER-DEFIBRILLATOR  . Coronary artery disease     s/p CABG  . Myocardial infarct 02/2009    NON-ST-SEGMENT ELEVATION MI  . Hypertension   . Hyperlipidemia   . Ischemic cardiomyopathy 02/09/2009    EF 40-45%  . Anoxic encephalopathy   . Marijuana abuse   . Peripheral vascular disease, unspecified   . Blood in stool   . CHF (congestive heart failure)     CHRONIC SYSTOLIC ; EF 40-45%    Past Surgical History  Procedure Laterality Date  . Circumcision    . Coronary artery bypass graft  06/17/01    X 4  . Insert / replace / remove pacemaker      IMPLANTATION AICD-ST. JUDE FORTIFY  . Cardiac surgery      Current Outpatient Prescriptions  Medication Sig Dispense Refill  . aspirin 81 MG EC tablet Take 81 mg by mouth every morning.       [name] atorvastatin (LIPITOR) 80 MG tablet Take 80 mg by mouth daily.      . carvedilol (COREG) 25 MG tablet Take 25 mg by mouth 2 (two) times daily with a meal.      . esomeprazole (NEXIUM) 40 MG packet Take 40 mg by mouth daily before breakfast.      . furosemide (LASIX) 20 MG tablet Take 20 mg by mouth every morning.      [name] HYDROcodone-acetaminophen (NORCO/VICODIN) 5-325 MG per tablet Take 1 tablet by mouth every 6 (six) hours as needed for pain.  15  tablet  0  . lisinopril (PRINIVIL,ZESTRIL) 20 MG tablet Take 20 mg by mouth daily.      . Multiple Vitamin (MULTIVITAMIN) tablet Take 1 tablet by mouth every morning.       . potassium chloride SA (K-DUR,KLOR-CON) 20 MEQ tablet Take 20 mEq by mouth every morning.      . promethazine (PHENERGAN) 25 MG tablet Take 1 tablet (25 mg total) by mouth every 6 (six) hours as needed for nausea.  10 tablet  0   No current facility-administered medications for this visit.    No Known Allergies  Review of Systems negative except from HPI and PMH  Physical Exam BP 111/77  Pulse 73  Ht 5\' 11"  (1.803 m)  Wt 226 lb 3.2 oz (102.604 kg)  BMI 31.56 kg/m2 Well developed and well nourished in no acute distress HENT normal E scleral and icterus clear Neck Supple JVP flat; carotids brisk and full Clear to ausculation  Regular rate and rhythm, no murmurs gallops or rub Soft with active bowel sounds No clubbing cyanosis none Edema Alert and oriented, grossly normal motor and sensory function Skin Warm and Dry    Assessment and  Plan

## 2012-05-31 NOTE — Patient Instructions (Signed)
Remote monitoring is used to monitor your Pacemaker of ICD from home. This monitoring reduces the number of office visits required to check your device to one time per year. It allows [place] to keep an eye on the functioning of your device to ensure it is working properly. You are scheduled for a device check from home on 08/27/12. You may send your transmission at any time that day. If you have a wireless device, the transmission will be sent automatically. After your physician reviews your transmission, you will receive a postcard with your next transmission date.   Your physician wants you to follow-up in: 1 year with Dr. Graciela Husbands. You will receive a reminder letter in the mail two months in advance. If you don't receive a letter, please call our office to schedule the follow-up appointment.   Your physician recommends that you continue on your current medications as directed. Please refer to the Current Medication list given to you today.

## 2012-05-31 NOTE — Assessment & Plan Note (Signed)
The patient's device was interrogated.  The information was reviewed. No changes were made in the programming.    

## 2012-05-31 NOTE — Assessment & Plan Note (Signed)
No intercurrent Ventricular tachycardia  

## 2012-06-01 NOTE — ED Provider Notes (Signed)
Medical screening examination/treatment/procedure(s) were performed by non-physician practitioner and as supervising physician I was immediately available for consultation/collaboration.  Raeford Razor, MD 06/01/12 [phone]

## 2012-06-28 ENCOUNTER — Other Ambulatory Visit: Payer: Self-pay | Admitting: *Deleted

## 2012-06-28 MED ORDER — CARVEDILOL 25 MG PO TABS: 25.0000 mg | ORAL_TABLET | Freq: Two times a day (BID) | ORAL | Status: DC

## 2012-08-27 ENCOUNTER — Encounter: Payer: Medicare Other | Admitting: *Deleted

## 2012-08-28 ENCOUNTER — Encounter: Payer: Self-pay | Admitting: *Deleted

## 2012-09-01 ENCOUNTER — Other Ambulatory Visit: Payer: Self-pay | Admitting: Cardiology

## 2012-09-12 ENCOUNTER — Emergency Department (HOSPITAL_COMMUNITY)
Admission: EM | Admit: 2012-09-12 | Discharge: 2012-09-13 | Disposition: A | Discharge status: Home / Self Care | Payer: Medicare Other | Source: Home / Self Care | Attending: Emergency Medicine | Admitting: Emergency Medicine

## 2012-09-12 ENCOUNTER — Encounter (HOSPITAL_COMMUNITY): Payer: Self-pay

## 2012-09-12 DIAGNOSIS — I5022 Chronic systolic (congestive) heart failure: Secondary | ICD-10-CM | POA: Insufficient documentation

## 2012-09-12 DIAGNOSIS — Z8719 Personal history of other diseases of the digestive system: Secondary | ICD-10-CM | POA: Insufficient documentation

## 2012-09-12 DIAGNOSIS — R1033 Periumbilical pain: Secondary | ICD-10-CM | POA: Insufficient documentation

## 2012-09-12 DIAGNOSIS — Z8679 Personal history of other diseases of the circulatory system: Secondary | ICD-10-CM | POA: Insufficient documentation

## 2012-09-12 DIAGNOSIS — Z79899 Other long term (current) drug therapy: Secondary | ICD-10-CM | POA: Insufficient documentation

## 2012-09-12 DIAGNOSIS — R1013 Epigastric pain: Secondary | ICD-10-CM | POA: Diagnosis not present

## 2012-09-12 DIAGNOSIS — I498 Other specified cardiac arrhythmias: Secondary | ICD-10-CM | POA: Diagnosis not present

## 2012-09-12 DIAGNOSIS — Z7982 Long term (current) use of aspirin: Secondary | ICD-10-CM | POA: Diagnosis not present

## 2012-09-12 DIAGNOSIS — E785 Hyperlipidemia, unspecified: Secondary | ICD-10-CM | POA: Insufficient documentation

## 2012-09-12 DIAGNOSIS — A084 Viral intestinal infection, unspecified: Secondary | ICD-10-CM

## 2012-09-12 DIAGNOSIS — I2510 Atherosclerotic heart disease of native coronary artery without angina pectoris: Secondary | ICD-10-CM | POA: Insufficient documentation

## 2012-09-12 DIAGNOSIS — R112 Nausea with vomiting, unspecified: Secondary | ICD-10-CM

## 2012-09-12 DIAGNOSIS — Z8669 Personal history of other diseases of the nervous system and sense organs: Secondary | ICD-10-CM | POA: Diagnosis not present

## 2012-09-12 DIAGNOSIS — A088 Other specified intestinal infections: Secondary | ICD-10-CM | POA: Insufficient documentation

## 2012-09-12 DIAGNOSIS — I252 Old myocardial infarction: Secondary | ICD-10-CM | POA: Diagnosis not present

## 2012-09-12 DIAGNOSIS — I509 Heart failure, unspecified: Secondary | ICD-10-CM | POA: Diagnosis not present

## 2012-09-12 DIAGNOSIS — I10 Essential (primary) hypertension: Secondary | ICD-10-CM | POA: Insufficient documentation

## 2012-09-12 DIAGNOSIS — Z8659 Personal history of other mental and behavioral disorders: Secondary | ICD-10-CM | POA: Insufficient documentation

## 2012-09-12 DIAGNOSIS — D72829 Elevated white blood cell count, unspecified: Secondary | ICD-10-CM | POA: Insufficient documentation

## 2012-09-12 DIAGNOSIS — Z8674 Personal history of sudden cardiac arrest: Secondary | ICD-10-CM | POA: Insufficient documentation

## 2012-09-12 DIAGNOSIS — I739 Peripheral vascular disease, unspecified: Secondary | ICD-10-CM | POA: Insufficient documentation

## 2012-09-12 DIAGNOSIS — Z951 Presence of aortocoronary bypass graft: Secondary | ICD-10-CM | POA: Insufficient documentation

## 2012-09-12 DIAGNOSIS — R630 Anorexia: Secondary | ICD-10-CM | POA: Insufficient documentation

## 2012-09-12 DIAGNOSIS — K5730 Diverticulosis of large intestine without perforation or abscess without bleeding: Secondary | ICD-10-CM | POA: Diagnosis not present

## 2012-09-12 DIAGNOSIS — Z87891 Personal history of nicotine dependence: Secondary | ICD-10-CM | POA: Insufficient documentation

## 2012-09-12 DIAGNOSIS — Z95810 Presence of automatic (implantable) cardiac defibrillator: Secondary | ICD-10-CM | POA: Insufficient documentation

## 2012-09-12 LAB — CBC
Platelets: 242 10*3/uL (ref 150–400)
RBC: 5.99 MIL/uL — ABNORMAL HIGH (ref 4.22–5.81)
WBC: 13.2 10*3/uL — ABNORMAL HIGH (ref 4.0–10.5)

## 2012-09-12 LAB — COMPREHENSIVE METABOLIC PANEL
ALT: 23 U/L (ref 0–53)
AST: 20 U/L (ref 0–37)
CO2: 26 mEq/L (ref 19–32)
Chloride: 100 mEq/L (ref 96–112)
GFR calc non Af Amer: 87 mL/min — ABNORMAL LOW (ref >90)
Sodium: 137 mEq/L (ref 135–145)
Total Bilirubin: 0.7 mg/dL (ref 0.3–1.2)

## 2012-09-12 MED ORDER — KETOROLAC TROMETHAMINE 30 MG/ML IJ SOLN
30.0000 mg | Freq: Once | INTRAMUSCULAR | Status: AC
  Administered 2012-09-12: 30 mg via INTRAVENOUS
  Filled 2012-09-12: qty 1

## 2012-09-12 MED ORDER — SODIUM CHLORIDE 0.9 % IV BOLUS (SEPSIS)
1000.0000 mL | Freq: Once | INTRAVENOUS | Status: AC
  Administered 2012-09-12: 1000 mL via INTRAVENOUS

## 2012-09-12 MED ORDER — ONDANSETRON HCL 4 MG/2ML IJ SOLN
4.0000 mg | Freq: Once | INTRAMUSCULAR | Status: AC
  Administered 2012-09-12: 4 mg via INTRAVENOUS
  Filled 2012-09-12: qty 2

## 2012-09-12 NOTE — ED Notes (Signed)
Pt complains of abdominal pain and vomiting since this am 

## 2012-09-12 NOTE — ED Notes (Signed)
Pt states he has been throwing up all day and he feels dehydrated,  Pt states his stomach is cramping really bad, 10/10 pt grimacing and holding stomach

## 2012-09-12 NOTE — ED Provider Notes (Signed)
History     CSN: [id number]  Arrival date & time 09/12/12  2040   First MD Initiated Contact with Patient 09/12/12 2230      Chief Complaint  Patient presents with  . Emesis    (Consider location/radiation/quality/duration/timing/severity/associated sxs/prior treatment) HPI Comments: 47 year old male with a past medical history of CAD status post CABG, hyperlipidemia, peripheral vascular disease and CHF presents to the emergency department complaining of gradual onset mid abdominal pain beginning when he woke up this morning. Admits to associated nausea and vomiting. Pain described as cramping, nonradiating rated 8/10. He has not had any alleviating factors. Nothing in specific makes the pain worse. He has been unable to keep anything down. The last thing he ate was earlier in the evening yesterday which was 2 chicken nuggets, fries and an orange. Denies fever, chills, chest pain, sob, diarrhea, constipation or urinary changes.  Patient is a 47 y.o. male presenting with vomiting. The history is provided by the patient and the spouse.  Emesis Associated symptoms: abdominal pain   Associated symptoms: no chills, no diarrhea and no headaches     Past Medical History  Diagnosis Date  . Cardiac arrest - ventricular fibrillation 02/11/2009    SUCCESSFUL PLACEMENT OF A [employer] FORTIFY SINGLE LEAD AUTOMATIC IMPLANTABLE CARDIOVERTER-DEFIBRILLATOR  . Coronary artery disease     s/p CABG  . Myocardial infarct 02/2009    NON-ST-SEGMENT ELEVATION MI  . Hypertension   . Hyperlipidemia   . Ischemic cardiomyopathy 02/09/2009    EF 40-45%  . Anoxic encephalopathy   . Marijuana abuse   . Peripheral vascular disease, unspecified   . Blood in stool   . CHF (congestive heart failure)     CHRONIC SYSTOLIC ; EF 40-45%    Past Surgical History  Procedure Laterality Date  . Circumcision    . Coronary artery bypass graft  06/17/01    X 4  . Insert / replace / remove pacemaker     IMPLANTATION AICD-[employer] FORTIFY  . Cardiac surgery      Family History  Problem Relation Age of Onset  . Hypertension Mother   . Heart attack Father     DIED AT 34 FROM MI  . Heart disease Sister     CAD AND PREVIOUS CABG  . Hypertension      IN MOST OF HIS SIBLINGS    History  Substance Use Topics  . Smoking status: Former Smoker -- 2.00 packs/day for 20 years    Quit date: 04/04/2003  . Smokeless tobacco: Never Used  . Alcohol Use: No      Review of Systems  Constitutional: Positive for appetite change. Negative for fever and chills.  Respiratory: Negative for shortness of breath.   Cardiovascular: Negative for chest pain.  Gastrointestinal: Positive for nausea, vomiting and abdominal pain. Negative for diarrhea and constipation.  Musculoskeletal: Negative for back pain.  Neurological: Negative for dizziness, weakness, light-headedness and headaches.  All other systems reviewed and are negative.    Allergies  Review of patient's allergies indicates no known allergies.  Home Medications   Current Outpatient Rx  Name  Route  Sig  Dispense  Refill  . esomeprazole (NEXIUM) 40 MG capsule   Oral   Take 40 mg by mouth daily before breakfast.         . aspirin 81 MG EC tablet   Oral   Take 81 mg by mouth every morning.          [name]  atorvastatin (LIPITOR) 80 MG tablet   Oral   Take 80 mg by mouth daily.         . carvedilol (COREG) 25 MG tablet   Oral   Take 1 tablet (25 mg total) by mouth 2 (two) times daily with a meal.   60 tablet   11   . furosemide (LASIX) 20 MG tablet   Oral   Take 20 mg by mouth every morning.         [name] HYDROcodone-acetaminophen (NORCO/VICODIN) 5-325 MG per tablet   Oral   Take 1 tablet by mouth every 6 (six) hours as needed for pain.   15 tablet   0   . lisinopril (PRINIVIL,ZESTRIL) 20 MG tablet   Oral   Take 20 mg by mouth daily.         . Multiple Vitamin (MULTIVITAMIN) tablet   Oral   Take 1 tablet by mouth  every morning.          . potassium chloride SA (K-DUR,KLOR-CON) 20 MEQ tablet   Oral   Take 20 mEq by mouth every morning.         . promethazine (PHENERGAN) 25 MG tablet   Oral   Take 1 tablet (25 mg total) by mouth every 6 (six) hours as needed for nausea.   10 tablet   0     BP 144/91  Pulse 95  Temp(Src) 98 F (36.7 C) (Oral)  Resp 22  SpO2 100%  Physical Exam  Nursing note and vitals reviewed. Constitutional: He is oriented to person, place, and time. He appears well-developed and well-nourished. No distress.  HENT:  Head: Normocephalic and atraumatic.  Mouth/Throat: Oropharynx is clear and moist.  Eyes: Conjunctivae and EOM are normal. Pupils are equal, round, and reactive to light. No scleral icterus.  Neck: Normal range of motion. Neck supple.  Cardiovascular: Normal rate, regular rhythm, normal heart sounds and intact distal pulses.   Pulmonary/Chest: Effort normal and breath sounds normal. No respiratory distress. He has no wheezes. He has no rales.  Abdominal: Soft. Normal appearance and bowel sounds are normal. He exhibits no distension and no mass. There is tenderness (mild) in the periumbilical area. There is no rigidity, no rebound and no guarding.  Musculoskeletal: Normal range of motion. He exhibits no edema.  Neurological: He is alert and oriented to person, place, and time.  Skin: Skin is warm and dry. He is not diaphoretic.  Psychiatric: He has a normal mood and affect. His behavior is normal.    ED Course  Procedures (including critical care time)  Labs Reviewed  CBC - Abnormal; Notable for the following:    WBC 13.2 (*)    RBC 5.99 (*)    Hemoglobin 17.4 (*)    MCHC 36.3 (*)    All other components within normal limits  COMPREHENSIVE METABOLIC PANEL - Abnormal; Notable for the following:    Glucose, Bld 102 (*)    GFR calc non Af Amer 87 (*)    All other components within normal limits  LIPASE, BLOOD   No results found.   1. Viral  gastroenteritis   2. Nausea & vomiting       MDM  Viral gastroenteritis- mild leukocytosis of 13.2, labs otherwise unremarkable. No peritoneal signs on exam. He is feeling better after receiving fluids, zofran and toradol. Abdomen soft, nt, nd with normal bowel sounds. Vitals stable. He is stable for discharge. Advised increased fluids, rest, bland diet. Return precautions discussed.  Patient states understanding of plan and is agreeable.         Trevor Mace, PA-C 09/13/12 0001

## 2012-09-13 ENCOUNTER — Encounter (HOSPITAL_COMMUNITY): Payer: Self-pay | Admitting: Emergency Medicine

## 2012-09-13 ENCOUNTER — Emergency Department (HOSPITAL_COMMUNITY)
Admission: EM | Admit: 2012-09-13 | Discharge: 2012-09-13 | Disposition: A | Discharge status: Home / Self Care | Payer: Medicare Other | Attending: Emergency Medicine | Admitting: Emergency Medicine

## 2012-09-13 ENCOUNTER — Emergency Department (HOSPITAL_COMMUNITY): Payer: Medicare Other

## 2012-09-13 DIAGNOSIS — R1013 Epigastric pain: Secondary | ICD-10-CM

## 2012-09-13 DIAGNOSIS — I498 Other specified cardiac arrhythmias: Secondary | ICD-10-CM | POA: Diagnosis not present

## 2012-09-13 DIAGNOSIS — K5730 Diverticulosis of large intestine without perforation or abscess without bleeding: Secondary | ICD-10-CM | POA: Diagnosis not present

## 2012-09-13 LAB — CBC WITH DIFFERENTIAL/PLATELET
Basophils Absolute: 0.1 10*3/uL (ref 0.0–0.1)
Eosinophils Absolute: 0.1 10*3/uL (ref 0.0–0.7)
Eosinophils Relative: 1 % (ref 0–5)
Lymphs Abs: 3.7 10*3/uL (ref 0.7–4.0)
MCH: 28.7 pg (ref 26.0–34.0)
MCV: 79.8 fL (ref 78.0–100.0)
Platelets: 236 10*3/uL (ref 150–400)
RDW: 14.5 % (ref 11.5–15.5)

## 2012-09-13 LAB — COMPREHENSIVE METABOLIC PANEL
ALT: 22 U/L (ref 0–53)
Alkaline Phosphatase: 90 U/L (ref 39–117)
Calcium: 9.8 mg/dL (ref 8.4–10.5)
Creatinine, Ser: 1.4 mg/dL — ABNORMAL HIGH (ref 0.50–1.35)
GFR calc non Af Amer: 59 mL/min — ABNORMAL LOW (ref >90)
Glucose, Bld: 91 mg/dL (ref 70–99)
Sodium: 136 mEq/L (ref 135–145)
Total Bilirubin: 0.8 mg/dL (ref 0.3–1.2)
Total Protein: 7.6 g/dL (ref 6.0–8.3)

## 2012-09-13 MED ORDER — KETOROLAC TROMETHAMINE 30 MG/ML IJ SOLN
30.0000 mg | Freq: Once | INTRAMUSCULAR | Status: AC
  Administered 2012-09-13: 30 mg via INTRAVENOUS
  Filled 2012-09-13: qty 1

## 2012-09-13 MED ORDER — DIPHENHYDRAMINE HCL 50 MG/ML IJ SOLN
INTRAMUSCULAR | Status: AC
  Filled 2012-09-13: qty 1

## 2012-09-13 MED ORDER — IOHEXOL 300 MG/ML  SOLN
100.0000 mL | Freq: Once | INTRAMUSCULAR | Status: AC | PRN
  Administered 2012-09-13: 100 mL via INTRAVENOUS

## 2012-09-13 MED ORDER — SODIUM CHLORIDE 0.9 % IV BOLUS (SEPSIS)
1000.0000 mL | Freq: Once | INTRAVENOUS | Status: AC
  Administered 2012-09-13: 1000 mL via INTRAVENOUS

## 2012-09-13 MED ORDER — OMEPRAZOLE 20 MG PO CPDR: 20.0000 mg | DELAYED_RELEASE_CAPSULE | Freq: Every day | ORAL | Status: DC

## 2012-09-13 MED ORDER — DIPHENHYDRAMINE HCL 50 MG/ML IJ SOLN
25.0000 mg | Freq: Once | INTRAMUSCULAR | Status: DC
  Filled 2012-09-13: qty 1

## 2012-09-13 MED ORDER — FAMOTIDINE 20 MG PO TABS: 20.0000 mg | ORAL_TABLET | Freq: Two times a day (BID) | ORAL | Status: DC

## 2012-09-13 MED ORDER — GI COCKTAIL ~~LOC~~
30.0000 mL | Freq: Once | ORAL | Status: AC
  Administered 2012-09-13: 30 mL via ORAL
  Filled 2012-09-13: qty 30

## 2012-09-13 MED ORDER — HYDROMORPHONE HCL PF 1 MG/ML IJ SOLN
1.0000 mg | Freq: Once | INTRAMUSCULAR | Status: AC
  Administered 2012-09-13: 1 mg via INTRAVENOUS
  Filled 2012-09-13: qty 1

## 2012-09-13 MED ORDER — ONDANSETRON HCL 4 MG/2ML IJ SOLN
4.0000 mg | Freq: Once | INTRAMUSCULAR | Status: AC
  Administered 2012-09-13: 4 mg via INTRAVENOUS
  Filled 2012-09-13: qty 2

## 2012-09-13 MED ORDER — IOHEXOL 300 MG/ML  SOLN
50.0000 mL | Freq: Once | INTRAMUSCULAR | Status: AC | PRN
  Administered 2012-09-13: 50 mL via ORAL

## 2012-09-13 NOTE — ED Notes (Signed)
Pt stated he was seen yesterday for stomach pain, vomited at 1200 and began having intermittent chest pain, central chest 9/10.  Denies ShOB, lightheadedness, or dizziness.  Pt states hx of MI with a defibrillator placed.

## 2012-09-13 NOTE — ED Provider Notes (Signed)
History     CSN: [id number]  Arrival date & time 09/13/12  2026   First MD Initiated Contact with Patient 09/13/12 2029      Chief Complaint  Patient presents with  . Abdominal Pain  . Chest Pain    (Consider location/radiation/quality/duration/timing/severity/associated sxs/prior treatment) HPI Comments: 47 year old male with a history of coronary artery bypass grafting, history of nondistended, ischemic arthropathy, intermittent blood in the stool who presents with a complaint of abdominal pain which is epigastric in location, aching, severe, persistent, nothing seems to make it better or worse, associated with nausea vomiting but no diarrhea. He was seen for similar findings last night, noted to have a leukocytosis but is cooperative metabolic panel and lipase were normal, he was discharged home in a stable condition. He does have a history of heart disease requiring bypass grafting but states this is vastly different than that helped in the past.  Patient is a 47 y.o. male presenting with chest pain. The history is provided by the patient, the spouse and medical records.  Chest Pain   Past Medical History  Diagnosis Date  . Cardiac arrest - ventricular fibrillation 02/11/2009    SUCCESSFUL PLACEMENT OF A [employer]ICAL FORTIFY SINGLE LEAD AUTOMATIC IMPLANTABLE CARDIOVERTER-DEFIBRILLATOR  . Coronary artery disease     s/p CABG  . Myocardial infarct 02/2009    NON-ST-SEGMENT ELEVATION MI  . Hypertension   . Hyperlipidemia   . Ischemic cardiomyopathy 02/09/2009    EF 40-45%  . Anoxic encephalopathy   . Marijuana abuse   . Peripheral vascular disease, unspecified   . Blood in stool   . CHF (congestive heart failure)     CHRONIC SYSTOLIC ; EF 40-45%    Past Surgical History  Procedure Laterality Date  . Circumcision    . Coronary artery bypass graft  06/17/01    X 4  . Insert / replace / remove pacemaker      IMPLANTATION AICD-[employer] FORTIFY  . Cardiac surgery       Family History  Problem Relation Age of Onset  . Hypertension Mother   . Heart attack Father     DIED AT 79 FROM MI  . Heart disease Sister     CAD AND PREVIOUS CABG  . Hypertension      IN MOST OF HIS SIBLINGS    History  Substance Use Topics  . Smoking status: Former Smoker -- 2.00 packs/day for 20 years    Quit date: 04/04/2003  . Smokeless tobacco: Never Used  . Alcohol Use: No      Review of Systems  Cardiovascular: Positive for chest pain.  All other systems reviewed and are negative.    Allergies  Review of patient's allergies indicates no known allergies.  Home Medications   Current Outpatient Rx  Name  Route  Sig  Dispense  Refill  . aspirin 81 MG EC tablet   Oral   Take 81 mg by mouth every morning.          [name] atorvastatin (LIPITOR) 80 MG tablet   Oral   Take 80 mg by mouth every morning.          . carvedilol (COREG) 25 MG tablet   Oral   Take 1 tablet (25 mg total) by mouth 2 (two) times daily with a meal.   60 tablet   11   . esomeprazole (NEXIUM) 40 MG capsule   Oral   Take 40 mg by mouth daily before breakfast.         .  furosemide (LASIX) 20 MG tablet   Oral   Take 20 mg by mouth every morning.         [name] lisinopril (PRINIVIL,ZESTRIL) 20 MG tablet   Oral   Take 20 mg by mouth every morning.          . Multiple Vitamin (MULTIVITAMIN) tablet   Oral   Take 1 tablet by mouth every morning.          . potassium chloride SA (K-DUR,KLOR-CON) 20 MEQ tablet   Oral   Take 20 mEq by mouth every evening.          . promethazine (PHENERGAN) 25 MG tablet   Oral   Take 1 tablet (25 mg total) by mouth every 6 (six) hours as needed for nausea.   10 tablet   0   . famotidine (PEPCID) 20 MG tablet   Oral   Take 1 tablet (20 mg total) by mouth 2 (two) times daily.   30 tablet   0   . omeprazole (PRILOSEC) 20 MG capsule   Oral   Take 1 capsule (20 mg total) by mouth daily.   30 capsule   1     BP 105/68  Pulse 63   Temp(Src) 97.8 F (36.6 C) (Oral)  Resp 12  SpO2 98%  Physical Exam  Nursing note and vitals reviewed. Constitutional: He appears well-developed and well-nourished.  Uncomfortable appearing  HENT:  Head: Normocephalic and atraumatic.  Mouth/Throat: Oropharynx is clear and moist. No oropharyngeal exudate.  Eyes: Conjunctivae and EOM are normal. Pupils are equal, round, and reactive to light. Right eye exhibits no discharge. Left eye exhibits no discharge. No scleral icterus.  Neck: Normal range of motion. Neck supple. No JVD present. No thyromegaly present.  Cardiovascular: Normal rate, regular rhythm, normal heart sounds and intact distal pulses.  Exam reveals no gallop and no friction rub.   No murmur heard. Pulmonary/Chest: Effort normal and breath sounds normal. No respiratory distress. He has no wheezes. He has no rales.  Abdominal: Soft. Bowel sounds are normal. He exhibits no distension and no mass. There is tenderness ( Midabdominal and epigastric tenderness, mild right upper quadrant tenderness, no lower abdominal tenderness).  No peritoneal signs, normal bowel sounds, no guarding  Musculoskeletal: Normal range of motion. He exhibits no edema and no tenderness.  Lymphadenopathy:    He has no cervical adenopathy.  Neurological: He is alert. Coordination normal.  Skin: Skin is warm and dry. No rash noted. No erythema.  Psychiatric: He has a normal mood and affect. His behavior is normal.    ED Course  Procedures (including critical care time)  Labs Reviewed  CBC WITH DIFFERENTIAL - Abnormal; Notable for the following:    WBC 13.5 (*)    RBC 5.85 (*)    Neutro Abs 8.4 (*)    Monocytes Absolute 1.3 (*)    All other components within normal limits  COMPREHENSIVE METABOLIC PANEL - Abnormal; Notable for the following:    Creatinine, Ser 1.40 (*)    GFR calc non Af Amer 59 (*)    GFR calc Af Amer 68 (*)    All other components within normal limits  LIPASE, BLOOD  TROPONIN I    Ct Abdomen Pelvis W Contrast  09/13/2012   *RADIOLOGY REPORT*  Clinical Data: Chest and abdominal pain, vomiting.  CT ABDOMEN AND PELVIS WITH CONTRAST  Technique:  Multidetector CT imaging of the abdomen and pelvis was performed following the standard protocol during bolus  administration of intravenous contrast.  Contrast: OMNIPAQUE IOHEXOL 300 MG/ML  SOLN  Comparison: 05/30/2012  Findings: Visualized lung bases clear.  Previous median sternotomy. Transvenous pacing leads partially seen.  Unremarkable liver, nondilated gallbladder, spleen, adrenal glands, kidneys, pancreas. Patchy aortoiliac calcifications without aneurysm.  Portal vein patent.  Stomach, small bowel, and colon are nondilated.  Normal appendix.  Scattered distal descending and sigmoid diverticula without significant adjacent inflammatory/edematous change. Moderate distention of the urinary bladder.  Moderate prostatic enlargement with coarse central calcifications.  No adenopathy evident.  No ascites.  No free air.  Normal renal excretion on delayed scans.  Spondylitic changes in the lower lumbar spine.  IMPRESSION:  1.  No acute abdominal process. 2.  Diverticulosis.   Original Report Authenticated By: D. Andria Rhein, MD     1. Epigastric pain       MDM  The patient appears very uncomfortable however at this time he has an EKG which is nonischemic, his symptoms are primarily abdominal and I doubt this is cardiac in origin. EKG shows a sinus bradycardia, laboratory panel is pending but due to ongoing abdominal pain symptoms he'll need some kind of imaging to further elucidate the source of the symptoms. Pain medications and IV fluids have been ordered.  ED ECG REPORT  I personally interpreted this EKG   Date: 09/13/2012   Rate: 60  Rhythm: normal sinus rhythm  QRS Axis: normal  Intervals: normal  ST/T Wave abnormalities: normal  Conduction Disutrbances:none  Narrative Interpretation:   Old EKG Reviewed: Compared with  the Feb third 2012, no significant changes  Pt improved after GI cocktail - has ongoing WBC elevated - warned pt of this overtime and needs f/u in next month for recheck of CBC when feeling well.  Expresses understanding.     Meds given in ED:  Medications  sodium chloride 0.9 % bolus 1,000 mL (0 mLs Intravenous Stopped 09/13/12 2327)  HYDROmorphone (DILAUDID) injection 1 mg (1 mg Intravenous Given 09/13/12 2056)  ondansetron (ZOFRAN) injection 4 mg (4 mg Intravenous Given 09/13/12 2056)  iohexol (OMNIPAQUE) 300 MG/ML solution 50 mL (50 mLs Oral Contrast Given 09/13/12 2057)  iohexol (OMNIPAQUE) 300 MG/ML solution 100 mL (100 mLs Intravenous Contrast Given 09/13/12 2145)  gi cocktail (Maalox,Lidocaine,Donnatal) (30 mLs Oral Given 09/13/12 2229)    New Prescriptions   FAMOTIDINE (PEPCID) 20 MG TABLET    Take 1 tablet (20 mg total) by mouth 2 (two) times daily.   OMEPRAZOLE (PRILOSEC) 20 MG CAPSULE    Take 1 capsule (20 mg total) by mouth daily.      Vida Roller, MD 09/13/12 [phone]

## 2012-09-14 NOTE — ED Provider Notes (Signed)
Medical screening examination/treatment/procedure(s) were performed by non-physician practitioner and as supervising physician I was immediately available for consultation/collaboration.   David H Yao, MD 09/14/12 1501 

## 2012-09-15 ENCOUNTER — Emergency Department (HOSPITAL_COMMUNITY)
Admission: EM | Admit: 2012-09-15 | Discharge: 2012-09-15 | Disposition: A | Discharge status: Home / Self Care | Payer: Medicare Other

## 2012-09-15 ENCOUNTER — Emergency Department (HOSPITAL_COMMUNITY)
Admission: EM | Admit: 2012-09-15 | Discharge: 2012-09-16 | Disposition: A | Discharge status: Home / Self Care | Payer: Medicare Other

## 2012-09-16 DIAGNOSIS — R109 Unspecified abdominal pain: Secondary | ICD-10-CM | POA: Diagnosis not present

## 2012-09-16 DIAGNOSIS — K8020 Calculus of gallbladder without cholecystitis without obstruction: Secondary | ICD-10-CM | POA: Diagnosis not present

## 2012-09-16 DIAGNOSIS — R112 Nausea with vomiting, unspecified: Secondary | ICD-10-CM | POA: Diagnosis not present

## 2012-09-16 DIAGNOSIS — R079 Chest pain, unspecified: Secondary | ICD-10-CM | POA: Diagnosis not present

## 2012-09-16 DIAGNOSIS — R1013 Epigastric pain: Secondary | ICD-10-CM | POA: Diagnosis not present

## 2012-09-16 DIAGNOSIS — K7689 Other specified diseases of liver: Secondary | ICD-10-CM | POA: Diagnosis not present

## 2012-09-20 DIAGNOSIS — I10 Essential (primary) hypertension: Secondary | ICD-10-CM | POA: Diagnosis not present

## 2012-09-20 DIAGNOSIS — K8020 Calculus of gallbladder without cholecystitis without obstruction: Secondary | ICD-10-CM | POA: Diagnosis not present

## 2012-09-20 DIAGNOSIS — E669 Obesity, unspecified: Secondary | ICD-10-CM | POA: Diagnosis not present

## 2012-10-02 DIAGNOSIS — Z79899 Other long term (current) drug therapy: Secondary | ICD-10-CM | POA: Diagnosis not present

## 2012-10-02 DIAGNOSIS — Z6831 Body mass index (BMI) 31.0-31.9, adult: Secondary | ICD-10-CM | POA: Diagnosis not present

## 2012-10-02 DIAGNOSIS — Z01818 Encounter for other preprocedural examination: Secondary | ICD-10-CM | POA: Diagnosis not present

## 2012-10-02 DIAGNOSIS — K8010 Calculus of gallbladder with chronic cholecystitis without obstruction: Secondary | ICD-10-CM | POA: Diagnosis not present

## 2012-10-02 DIAGNOSIS — I2510 Atherosclerotic heart disease of native coronary artery without angina pectoris: Secondary | ICD-10-CM | POA: Diagnosis not present

## 2012-10-02 DIAGNOSIS — I509 Heart failure, unspecified: Secondary | ICD-10-CM | POA: Diagnosis not present

## 2012-10-02 DIAGNOSIS — Z87891 Personal history of nicotine dependence: Secondary | ICD-10-CM | POA: Diagnosis not present

## 2012-10-02 DIAGNOSIS — I10 Essential (primary) hypertension: Secondary | ICD-10-CM | POA: Diagnosis not present

## 2012-10-02 DIAGNOSIS — K8020 Calculus of gallbladder without cholecystitis without obstruction: Secondary | ICD-10-CM | POA: Diagnosis not present

## 2012-10-02 DIAGNOSIS — Z951 Presence of aortocoronary bypass graft: Secondary | ICD-10-CM | POA: Diagnosis not present

## 2012-10-02 DIAGNOSIS — K7689 Other specified diseases of liver: Secondary | ICD-10-CM | POA: Diagnosis not present

## 2012-10-02 DIAGNOSIS — K219 Gastro-esophageal reflux disease without esophagitis: Secondary | ICD-10-CM | POA: Diagnosis not present

## 2012-10-02 DIAGNOSIS — Z95810 Presence of automatic (implantable) cardiac defibrillator: Secondary | ICD-10-CM | POA: Diagnosis not present

## 2012-10-02 DIAGNOSIS — E669 Obesity, unspecified: Secondary | ICD-10-CM | POA: Diagnosis not present

## 2012-10-03 DIAGNOSIS — Z01818 Encounter for other preprocedural examination: Secondary | ICD-10-CM | POA: Diagnosis not present

## 2012-10-03 DIAGNOSIS — K219 Gastro-esophageal reflux disease without esophagitis: Secondary | ICD-10-CM | POA: Diagnosis not present

## 2012-10-03 DIAGNOSIS — I509 Heart failure, unspecified: Secondary | ICD-10-CM | POA: Diagnosis not present

## 2012-10-03 DIAGNOSIS — I10 Essential (primary) hypertension: Secondary | ICD-10-CM | POA: Diagnosis not present

## 2012-10-03 DIAGNOSIS — K8010 Calculus of gallbladder with chronic cholecystitis without obstruction: Secondary | ICD-10-CM | POA: Diagnosis not present

## 2012-10-03 DIAGNOSIS — Z79899 Other long term (current) drug therapy: Secondary | ICD-10-CM | POA: Diagnosis not present

## 2012-11-27 ENCOUNTER — Other Ambulatory Visit: Payer: Self-pay | Admitting: *Deleted

## 2012-11-27 NOTE — Telephone Encounter (Signed)
ERROR

## 2012-12-27 ENCOUNTER — Telehealth: Payer: Self-pay | Admitting: *Deleted

## 2012-12-27 ENCOUNTER — Other Ambulatory Visit: Payer: Self-pay | Admitting: Cardiology

## 2012-12-27 NOTE — Telephone Encounter (Signed)
Patient called requesting a refill on Omeprazole. It looks like he was given this in the hospital, and we have been filling his Nexium. Should he be on both? Please advise.

## 2012-12-27 NOTE — Telephone Encounter (Signed)
Home number is not valid. [employer] number the pt is not available.

## 2013-01-03 NOTE — Telephone Encounter (Signed)
Home number is not valid. Mobile number is disconnected or changed.

## 2013-01-31 ENCOUNTER — Other Ambulatory Visit: Payer: Self-pay | Admitting: Cardiology

## 2013-02-09 ENCOUNTER — Other Ambulatory Visit: Payer: Self-pay | Admitting: Cardiology

## 2013-02-26 ENCOUNTER — Other Ambulatory Visit: Payer: Self-pay | Admitting: Cardiology

## 2013-04-09 ENCOUNTER — Other Ambulatory Visit: Payer: Self-pay | Admitting: Cardiology

## 2013-04-27 ENCOUNTER — Other Ambulatory Visit: Payer: Self-pay | Admitting: Cardiology

## 2013-05-22 ENCOUNTER — Other Ambulatory Visit: Payer: Self-pay | Admitting: Cardiology

## 2013-05-23 ENCOUNTER — Other Ambulatory Visit: Payer: Self-pay | Admitting: *Deleted

## 2013-05-23 MED ORDER — FUROSEMIDE 20 MG PO TABS: 20.0000 mg | ORAL_TABLET | Freq: Every morning | ORAL | Status: DC

## 2013-05-23 MED ORDER — POTASSIUM CHLORIDE CRYS ER 20 MEQ PO TBCR: 20.0000 meq | EXTENDED_RELEASE_TABLET | Freq: Every evening | ORAL | Status: DC

## 2013-05-28 ENCOUNTER — Other Ambulatory Visit: Payer: Self-pay | Admitting: Cardiology

## 2013-06-27 ENCOUNTER — Ambulatory Visit: Payer: Medicare Other | Admitting: Cardiology

## 2013-07-01 ENCOUNTER — Encounter: Payer: Medicare Other | Admitting: Internal Medicine

## 2013-07-02 ENCOUNTER — Other Ambulatory Visit: Payer: Self-pay | Admitting: Cardiology

## 2013-07-03 ENCOUNTER — Other Ambulatory Visit: Payer: Self-pay

## 2013-07-03 MED ORDER — LISINOPRIL 20 MG PO TABS: 20.0000 mg | ORAL_TABLET | Freq: Every morning | ORAL | Status: DC

## 2013-07-17 ENCOUNTER — Other Ambulatory Visit: Payer: Self-pay

## 2013-07-17 MED ORDER — CARVEDILOL 25 MG PO TABS: 25.0000 mg | ORAL_TABLET | Freq: Two times a day (BID) | ORAL | Status: DC

## 2013-07-27 ENCOUNTER — Other Ambulatory Visit: Payer: Self-pay | Admitting: Cardiology

## 2013-08-07 ENCOUNTER — Encounter (INDEPENDENT_AMBULATORY_CARE_PROVIDER_SITE_OTHER): Payer: Self-pay

## 2013-08-07 ENCOUNTER — Ambulatory Visit (INDEPENDENT_AMBULATORY_CARE_PROVIDER_SITE_OTHER): Payer: Medicare Other | Admitting: Internal Medicine

## 2013-08-07 ENCOUNTER — Encounter: Payer: Self-pay | Admitting: Cardiology

## 2013-08-07 ENCOUNTER — Ambulatory Visit (INDEPENDENT_AMBULATORY_CARE_PROVIDER_SITE_OTHER): Payer: Medicare Other | Admitting: Cardiology

## 2013-08-07 VITALS — BP 124/82 | HR 66 | Ht 71.0 in | Wt 229.0 lb

## 2013-08-07 DIAGNOSIS — I2589 Other forms of chronic ischemic heart disease: Secondary | ICD-10-CM | POA: Diagnosis not present

## 2013-08-07 DIAGNOSIS — I2510 Atherosclerotic heart disease of native coronary artery without angina pectoris: Secondary | ICD-10-CM | POA: Diagnosis not present

## 2013-08-07 DIAGNOSIS — I469 Cardiac arrest, cause unspecified: Secondary | ICD-10-CM

## 2013-08-07 DIAGNOSIS — Z95810 Presence of automatic (implantable) cardiac defibrillator: Secondary | ICD-10-CM

## 2013-08-07 DIAGNOSIS — I255 Ischemic cardiomyopathy: Secondary | ICD-10-CM

## 2013-08-07 LAB — MDC_IDC_ENUM_SESS_TYPE_INCLINIC
Battery Remaining Longevity: 75.6 mo
HIGH POWER IMPEDANCE MEASURED VALUE: 53.4044
HighPow Impedance: 53 Ohm
Implantable Pulse Generator Serial Number: 743367
Lead Channel Pacing Threshold Amplitude: 0.75 V
Lead Channel Pacing Threshold Pulse Width: 0.5 ms
Lead Channel Setting Pacing Amplitude: 2.5 V
Lead Channel Setting Sensing Sensitivity: 0.5 mV
MDC IDC MSMT LEADCHNL RV IMPEDANCE VALUE: 512.5 Ohm
MDC IDC MSMT LEADCHNL RV SENSING INTR AMPL: 12 mV
MDC IDC SESS DTM: 20150507164553
MDC IDC SET LEADCHNL RV PACING PULSEWIDTH: 0.5 ms
MDC IDC SET ZONE DETECTION INTERVAL: 250 ms
MDC IDC SET ZONE DETECTION INTERVAL: 300 ms
MDC IDC STAT BRADY RV PERCENT PACED: 0 %

## 2013-08-07 MED ORDER — CARVEDILOL 25 MG PO TABS: 25.0000 mg | ORAL_TABLET | Freq: Two times a day (BID) | ORAL | Status: DC

## 2013-08-07 MED ORDER — LISINOPRIL 20 MG PO TABS: 20.0000 mg | ORAL_TABLET | Freq: Every morning | ORAL | Status: DC

## 2013-08-07 MED ORDER — FUROSEMIDE 20 MG PO TABS: 20.0000 mg | ORAL_TABLET | Freq: Every day | ORAL | Status: DC

## 2013-08-07 MED ORDER — POTASSIUM CHLORIDE CRYS ER 20 MEQ PO TBCR: 20.0000 meq | EXTENDED_RELEASE_TABLET | Freq: Once | ORAL | Status: DC

## 2013-08-07 MED ORDER — ATORVASTATIN CALCIUM 80 MG PO TABS
80.0000 mg | ORAL_TABLET | Freq: Every day | ORAL | Status: DC
Start: 2013-08-07 — End: 2014-06-10

## 2013-08-07 NOTE — Patient Instructions (Addendum)

## 2013-08-07 NOTE — Assessment & Plan Note (Signed)
Continue aspirin and statin. 

## 2013-08-07 NOTE — Assessment & Plan Note (Signed)
Management per electrophysiology. 

## 2013-08-07 NOTE — Assessment & Plan Note (Signed)
Continue ACE inhibitor and beta blocker. 

## 2013-08-07 NOTE — Assessment & Plan Note (Signed)
Continue statin. Check lipids and liver. 

## 2013-08-07 NOTE — Assessment & Plan Note (Signed)
Blood pressure controlled. Continue present medications. Check potassium and renal function. 

## 2013-08-07 NOTE — Patient Instructions (Signed)
Your physician wants you to follow-up in: ONE YEAR WITH DR CRENSHAW You will receive a reminder letter in the mail two months in advance. If you don't receive a letter, please call our office to schedule the follow-up appointment.   Your physician recommends that you return for lab work WHEN FASTING 

## 2013-08-07 NOTE — Progress Notes (Signed)
      Patient Care Team: Provider Default, MD as PCP - General   HPI  Billy [name] is a 48 y.o. male Seen in followup for an ICD implanted following a cardiac arrest fall 2011. His history of ischemic heart disease and prior bypass grafting and had declined ICD implantation prior to his arrest.   A recent Myoview demonstrating ejection fraction 38% with inferolateral hypokinesis   The patient denies chest pain, shortness of breath, nocturnal dyspnea, orthopnea or peripheral edema. There have been no palpitations, lightheadedness or syncope.    Past Medical History  Diagnosis Date  . Cardiac arrest - ventricular fibrillation 02/11/2009    SUCCESSFUL PLACEMENT OF A [employer] [place] FORTIFY SINGLE LEAD AUTOMATIC IMPLANTABLE CARDIOVERTER-DEFIBRILLATOR  . Coronary artery disease     s/p CABG  . Myocardial infarct 02/2009    NON-ST-SEGMENT ELEVATION MI  . Hypertension   . Hyperlipidemia   . Ischemic cardiomyopathy 02/09/2009    EF 40-45%  . Anoxic encephalopathy   . Marijuana abuse   . Peripheral vascular disease, unspecified   . Blood in stool   . CHF (congestive heart failure)     CHRONIC SYSTOLIC ; EF 93-26%    Past Surgical History  Procedure Laterality Date  . Circumcision    . Coronary artery bypass graft  06/17/01    X 4  . Insert / replace / remove pacemaker      IMPLANTATION AICD-[employer]UDE FORTIFY  . Cardiac surgery      Current Outpatient Prescriptions  Medication Sig Dispense Refill  . aspirin 81 MG EC tablet Take 81 mg by mouth every morning.       [name] atorvastatin (LIPITOR) 80 MG tablet Take 1 tablet (80 mg total) by mouth daily at 6 PM.  30 tablet  12  . carvedilol (COREG) 25 MG tablet Take 1 tablet (25 mg total) by mouth 2 (two) times daily with a meal.  60 tablet  12  . furosemide (LASIX) 20 MG tablet Take 1 tablet (20 mg total) by mouth daily.  30 tablet  12  . lisinopril (PRINIVIL,ZESTRIL) 20 MG tablet Take 1 tablet (20 mg total) by mouth every morning.   30 tablet  12  . Multiple Vitamin (MULTIVITAMIN) tablet Take 1 tablet by mouth every morning.       [name] NEXIUM 40 MG capsule take 1 capsule by mouth once daily  30 capsule  6  . potassium chloride SA (K-DUR,KLOR-CON) 20 MEQ tablet Take 1 tablet (20 mEq total) by mouth once.  30 tablet  0   No current facility-administered medications for this visit.    No Known Allergies  Review of Systems negative except from HPI and PMH  Physical Exam There were no vitals taken for this visit. Well developed and well nourished in no acute distress HENT normal E scleral and icterus clear Neck Supple JVP flat; carotids brisk and full Clear to ausculation Device pocket well healed; without hematoma or erythema.  There is no tethering *Regular rate and rhythm, no murmurs gallops or rub Soft with active bowel sounds No clubbing cyanosis  Edema Alert and oriented, grossly normal motor and sensory function Skin Warm and Dry    Assessment and  Plan  Aborted cardiac arrest  Ischemic heart disease with prior bypass surgery  Defibrillator-[employer] The patient's device was interrogated.  The information was reviewed. No changes were made in the programming.     No intercurrent ventricular arrhythmias. Continue current medications

## 2013-08-07 NOTE — Progress Notes (Signed)
HPI: FU CAD. Patient suffered a cardiac arrest in November of 2010. Cardiac catheterization at that time revealed an ejection fraction of 35%. Normal left main. The LAD had scattered 20-30% lesions. The first diagonal was occluded. The left circumflex had scattered 30-40% lesions and then occluded following the OM. The OM is stented, and the stent is widely patent without significant stenosis. The second OM and distal circumflex fill from left-to-left collaterals. The right coronary artery was known to be totally occluded in its proximal segment. The distal right coronary artery branches fill from left-to-right collaterals. The saphenous vein graft to first diagonal is widely patent. There is mild 40-50% stenosis of the diagonal just after the graft insertion site. Left internal mammary artery to LAD is patent. Saphenous vein graft to distal right coronary artery is known to be occluded. The saphenous vein graft to OM branch was totally occluded. This is a chronic occlusion as well. Echocardiogram initially revealed an EF of 10-15% however followup echo showed an EF of 40-45%, mild biatrial enlargement and mild mitral regurgitation. Given out of the hospital VF arrest a [employer] single lead ICD was placed. Nuclear study 1/14 showed EF 38, inferior MI with peri-infarct ischemia. I last saw him in Jan 2014. Since then, He has some dyspnea on exertion. Occasional aching sensation in his left chest at night. No exertional chest pain. No syncope.   Current Outpatient Prescriptions  Medication Sig Dispense Refill  . aspirin 81 MG EC tablet Take 81 mg by mouth every morning.       [name] atorvastatin (LIPITOR) 80 MG tablet take 1 tablet by mouth once daily  30 tablet  0  . carvedilol (COREG) 25 MG tablet Take 1 tablet (25 mg total) by mouth 2 (two) times daily with a meal.  60 tablet  0  . furosemide (LASIX) 20 MG tablet take 1 tablet by mouth every morning  30 tablet  0  . lisinopril (PRINIVIL,ZESTRIL) 20 MG  tablet Take 1 tablet (20 mg total) by mouth every morning.  30 tablet  1  . Multiple Vitamin (MULTIVITAMIN) tablet Take 1 tablet by mouth every morning.       [name] NEXIUM 40 MG capsule take 1 capsule by mouth once daily  30 capsule  6  . potassium chloride SA (K-DUR,KLOR-CON) 20 MEQ tablet take 1 tablet by mouth every evening  30 tablet  0   No current facility-administered medications for this visit.     Past Medical History  Diagnosis Date  . Cardiac arrest - ventricular fibrillation 02/11/2009    SUCCESSFUL PLACEMENT OF A [employer] [place] FORTIFY SINGLE LEAD AUTOMATIC IMPLANTABLE CARDIOVERTER-DEFIBRILLATOR  . Coronary artery disease     s/p CABG  . Myocardial infarct 02/2009    NON-ST-SEGMENT ELEVATION MI  . Hypertension   . Hyperlipidemia   . Ischemic cardiomyopathy 02/09/2009    EF 40-45%  . Anoxic encephalopathy   . Marijuana abuse   . Peripheral vascular disease, unspecified   . Blood in stool   . CHF (congestive heart failure)     CHRONIC SYSTOLIC ; EF 08-65%    Past Surgical History  Procedure Laterality Date  . Circumcision    . Coronary artery bypass graft  06/17/01    X 4  . Insert / replace / remove pacemaker      IMPLANTATION AICD-[employer] FORTIFY  . Cardiac surgery      History   Social History  . Marital Status: Married  Spouse Name: N/A    Number of Children: N/A  . Years of Education: N/A   Occupational History  . Not on file.   Social History Main Topics  . Smoking status: Former Smoker -- 2.00 packs/day for 20 years    Quit date: 04/04/2003  . Smokeless tobacco: Never Used  . Alcohol Use: No  . Drug Use: No  . Sexual Activity:    Other Topics Concern  . Not on file   Social History Narrative   MARRIED   FORMER TOBACCO USE, SMOKED FOR 20 YRS 2 PPD; QUIT IN 2005   NO ETOH   NO ILLICIT DRUG USE   NO REGULAR EXERCISE         ICD-[employer]. JUDE ; CERTIFIED LETTER SENT AND RETURNED BAD ADDRESS, PLS UPDATE DJW.    ROS: no fevers or chills,  productive cough, hemoptysis, dysphasia, odynophagia, melena, hematochezia, dysuria, hematuria, rash, seizure activity, orthopnea, PND, pedal edema, claudication. Remaining systems are negative.  Physical Exam: Well-developed well-nourished in no acute distress.  Skin is warm and dry.  HEENT is normal.  Neck is supple.  Chest is clear to auscultation with normal expansion.  Cardiovascular exam is regular rate and rhythm.  Abdominal exam nontender or distended. No masses palpated. Extremities show no edema. neuro grossly intact  ECG Sinus rhythm at a rate of 66. Prior inferior infarct. No ST changes.

## 2013-08-14 ENCOUNTER — Encounter: Payer: Self-pay | Admitting: Internal Medicine

## 2013-08-20 ENCOUNTER — Other Ambulatory Visit: Payer: Self-pay | Admitting: Cardiology

## 2013-09-04 ENCOUNTER — Encounter: Payer: Self-pay | Admitting: *Deleted

## 2013-09-04 ENCOUNTER — Other Ambulatory Visit (INDEPENDENT_AMBULATORY_CARE_PROVIDER_SITE_OTHER): Payer: Medicare Other

## 2013-09-04 DIAGNOSIS — I2510 Atherosclerotic heart disease of native coronary artery without angina pectoris: Secondary | ICD-10-CM | POA: Diagnosis not present

## 2013-09-04 LAB — HEPATIC FUNCTION PANEL
ALBUMIN: 3.7 g/dL (ref 3.5–5.2)
ALK PHOS: 93 U/L (ref 39–117)
ALT: 20 U/L (ref 0–53)
AST: 25 U/L (ref 0–37)
BILIRUBIN TOTAL: 0.6 mg/dL (ref 0.2–1.2)
Bilirubin, Direct: 0.1 mg/dL (ref 0.0–0.3)
Total Protein: 6.6 g/dL (ref 6.0–8.3)

## 2013-09-04 LAB — LIPID PANEL
CHOLESTEROL: 131 mg/dL (ref 0–200)
HDL: 38.7 mg/dL — AB (ref >39.00)
LDL Cholesterol: 79 mg/dL (ref 0–99)
NONHDL: 92.3
Total CHOL/HDL Ratio: 3
Triglycerides: 66 mg/dL (ref 0.0–149.0)
VLDL: 13.2 mg/dL (ref 0.0–40.0)

## 2013-09-04 LAB — BASIC METABOLIC PANEL
BUN: 14 mg/dL (ref 6–23)
CALCIUM: 9.6 mg/dL (ref 8.4–10.5)
CO2: 28 meq/L (ref 19–32)
CREATININE: 1.2 mg/dL (ref 0.4–1.5)
Chloride: 106 mEq/L (ref 96–112)
GFR: 85.83 mL/min (ref >60.00)
GLUCOSE: 94 mg/dL (ref 70–99)
Potassium: 4.5 mEq/L (ref 3.5–5.1)
Sodium: 139 mEq/L (ref 135–145)

## 2013-09-29 ENCOUNTER — Ambulatory Visit: Payer: Medicare Other | Admitting: Internal Medicine

## 2013-10-13 ENCOUNTER — Other Ambulatory Visit: Payer: Self-pay | Admitting: Cardiology

## 2013-11-10 ENCOUNTER — Telehealth: Payer: Self-pay | Admitting: Cardiology

## 2013-11-10 ENCOUNTER — Encounter: Payer: Medicare Other | Admitting: *Deleted

## 2013-11-10 NOTE — Telephone Encounter (Signed)
Spoke with pt and reminded pt of remote transmission that is due today. Pt verbalized understanding.   

## 2013-11-11 ENCOUNTER — Encounter: Payer: Self-pay | Admitting: Cardiology

## 2013-11-14 ENCOUNTER — Telehealth: Payer: Self-pay | Admitting: Internal Medicine

## 2013-11-14 NOTE — Telephone Encounter (Signed)
Informed pt wife that we would have a cell adapter mailed to her for pt home monitor. Pt wife requested a device check in office for this check and on the next one she will use the home monitor. Pt wife agreed to an appt on 11-24-13 at 4:00 to have ICD checked.

## 2013-11-14 NOTE — Telephone Encounter (Signed)
New message     Pt does not have a land line phone----how can he check his defib from home?

## 2013-11-25 ENCOUNTER — Ambulatory Visit: Payer: Medicare Other | Admitting: Family Medicine

## 2013-11-26 ENCOUNTER — Encounter: Payer: Self-pay | Admitting: Internal Medicine

## 2013-12-10 ENCOUNTER — Ambulatory Visit (INDEPENDENT_AMBULATORY_CARE_PROVIDER_SITE_OTHER): Payer: Medicare Other | Admitting: Family Medicine

## 2013-12-10 VITALS — BP 123/103 | HR 75 | Temp 98.2°F | Resp 16 | Ht 72.0 in | Wt 229.0 lb

## 2013-12-10 DIAGNOSIS — I2510 Atherosclerotic heart disease of native coronary artery without angina pectoris: Secondary | ICD-10-CM

## 2013-12-10 DIAGNOSIS — G8929 Other chronic pain: Secondary | ICD-10-CM

## 2013-12-10 DIAGNOSIS — M549 Dorsalgia, unspecified: Secondary | ICD-10-CM

## 2013-12-10 DIAGNOSIS — E669 Obesity, unspecified: Secondary | ICD-10-CM

## 2013-12-10 DIAGNOSIS — I10 Essential (primary) hypertension: Secondary | ICD-10-CM

## 2013-12-10 DIAGNOSIS — J3089 Other allergic rhinitis: Secondary | ICD-10-CM

## 2013-12-10 DIAGNOSIS — E785 Hyperlipidemia, unspecified: Secondary | ICD-10-CM

## 2013-12-10 DIAGNOSIS — M62830 Muscle spasm of back: Secondary | ICD-10-CM

## 2013-12-10 DIAGNOSIS — M5380 Other specified dorsopathies, site unspecified: Secondary | ICD-10-CM | POA: Diagnosis not present

## 2013-12-10 DIAGNOSIS — Z23 Encounter for immunization: Secondary | ICD-10-CM

## 2013-12-10 MED ORDER — CYCLOBENZAPRINE HCL 5 MG PO TABS: 5.0000 mg | ORAL_TABLET | Freq: Three times a day (TID) | ORAL | Status: DC | PRN

## 2013-12-10 MED ORDER — IBUPROFEN 800 MG PO TABS: 800.0000 mg | ORAL_TABLET | Freq: Three times a day (TID) | ORAL | Status: DC | PRN

## 2013-12-10 MED ORDER — LEVOCETIRIZINE DIHYDROCHLORIDE 5 MG PO TABS: 2.5000 mg | ORAL_TABLET | Freq: Every evening | ORAL | Status: DC

## 2013-12-10 NOTE — Patient Instructions (Signed)
DASH Eating Plan DASH stands for "Dietary Approaches to Stop Hypertension." The DASH eating plan is a healthy eating plan that has been shown to reduce high blood pressure (hypertension). Additional health benefits may include reducing the risk of type 2 diabetes mellitus, heart disease, and stroke. The DASH eating plan may also help with weight loss. WHAT DO I NEED TO KNOW ABOUT THE DASH EATING PLAN? For the DASH eating plan, you will follow these general guidelines:  Choose foods with a percent daily value for sodium of less than 5% (as listed on the food label).  Use salt-free seasonings or herbs instead of table salt or sea salt.  Check with your health care provider or pharmacist before using salt substitutes.  Eat lower-sodium products, often labeled as "lower sodium" or "no salt added."  Eat fresh foods.  Eat more vegetables, fruits, and low-fat dairy products.  Choose whole grains. Look for the word "whole" as the first word in the ingredient list.  Choose fish and skinless chicken or turkey more often than red meat. Limit fish, poultry, and meat to 6 oz (170 g) each day.  Limit sweets, desserts, sugars, and sugary drinks.  Choose heart-healthy fats.  Limit cheese to 1 oz (28 g) per day.  Eat more home-cooked food and less restaurant, buffet, and fast food.  Limit fried foods.  Cook foods using methods other than frying.  Limit canned vegetables. If you do use them, rinse them well to decrease the sodium.  When eating at a restaurant, ask that your food be prepared with less salt, or no salt if possible. WHAT FOODS CAN I EAT? Seek help from a dietitian for individual calorie needs. Grains Whole grain or whole wheat bread. Brown rice. Whole grain or whole wheat pasta. Quinoa, bulgur, and whole grain cereals. Low-sodium cereals. Corn or whole wheat flour tortillas. Whole grain cornbread. Whole grain crackers. Low-sodium crackers. Vegetables Fresh or frozen vegetables  (raw, steamed, roasted, or grilled). Low-sodium or reduced-sodium tomato and vegetable juices. Low-sodium or reduced-sodium tomato sauce and paste. Low-sodium or reduced-sodium canned vegetables.  Fruits All fresh, canned (in natural juice), or frozen fruits. Meat and Other Protein Products Ground beef (85% or leaner), grass-fed beef, or beef trimmed of fat. Skinless chicken or turkey. Ground chicken or turkey. Pork trimmed of fat. All fish and seafood. Eggs. Dried beans, peas, or lentils. Unsalted nuts and seeds. Unsalted canned beans. Dairy Low-fat dairy products, such as skim or 1% milk, 2% or reduced-fat cheeses, low-fat ricotta or cottage cheese, or plain low-fat yogurt. Low-sodium or reduced-sodium cheeses. Fats and Oils Tub margarines without trans fats. Light or reduced-fat mayonnaise and salad dressings (reduced sodium). Avocado. Safflower, olive, or canola oils. Natural peanut or almond butter. Other Unsalted popcorn and pretzels. The items listed above may not be a complete list of recommended foods or beverages. Contact your dietitian for more options. WHAT FOODS ARE NOT RECOMMENDED? Grains White bread. White pasta. White rice. Refined cornbread. Bagels and croissants. Crackers that contain trans fat. Vegetables Creamed or fried vegetables. Vegetables in a cheese sauce. Regular canned vegetables. Regular canned tomato sauce and paste. Regular tomato and vegetable juices. Fruits Dried fruits. Canned fruit in light or heavy syrup. Fruit juice. Meat and Other Protein Products Fatty cuts of meat. Ribs, chicken wings, bacon, sausage, bologna, salami, chitterlings, fatback, hot dogs, bratwurst, and packaged luncheon meats. Salted nuts and seeds. Canned beans with salt. Dairy Whole or 2% milk, cream, half-and-half, and cream cheese. Whole-fat or sweetened yogurt. Full-fat   cheeses or blue cheese. Nondairy creamers and whipped toppings. Processed cheese, cheese spreads, or cheese  curds. Condiments Onion and garlic salt, seasoned salt, table salt, and sea salt. Canned and packaged gravies. Worcestershire sauce. Tartar sauce. Barbecue sauce. Teriyaki sauce. Soy sauce, including reduced sodium. Steak sauce. Fish sauce. Oyster sauce. Cocktail sauce. Horseradish. Ketchup and mustard. Meat flavorings and tenderizers. Bouillon cubes. Hot sauce. Tabasco sauce. Marinades. Taco seasonings. Relishes. Fats and Oils Butter, stick margarine, lard, shortening, ghee, and bacon fat. Coconut, palm kernel, or palm oils. Regular salad dressings. Other Pickles and olives. Salted popcorn and pretzels. The items listed above may not be a complete list of foods and beverages to avoid. Contact your dietitian for more information. WHERE CAN I FIND MORE INFORMATION? National Heart, Lung, and Blood Institute: travelstabloid.com Document Released: 03/09/2011 Document Revised: 08/04/2013 Document Reviewed: 01/22/2013 [facility] Patient Information 2015 [place], [place]. This information is not intended to replace advice given to you by your health care provider. Make sure you discuss any questions you have with your health care provider. Chronic Back Pain  When back pain lasts longer than 3 months, it is called chronic back pain.People with chronic back pain often go through certain periods that are more intense (flare-ups).  CAUSES Chronic back pain can be caused by wear and tear (degeneration) on different structures in your back. These structures include:  The bones of your spine (vertebrae) and the joints surrounding your spinal cord and nerve roots (facets).  The strong, fibrous tissues that connect your vertebrae (ligaments). Degeneration of these structures may result in pressure on your nerves. This can lead to constant pain. HOME CARE INSTRUCTIONS  Avoid bending, heavy lifting, prolonged sitting, and activities which make the problem worse.  Take brief  periods of rest throughout the day to reduce your pain. Lying down or standing usually is better than sitting while you are resting.  Take over-the-counter or prescription medicines only as directed by your caregiver. SEEK IMMEDIATE MEDICAL CARE IF:   You have weakness or numbness in one of your legs or feet.  You have trouble controlling your bladder or bowels.  You have nausea, vomiting, abdominal pain, shortness of breath, or fainting. Document Released: 04/27/2004 Document Revised: 06/12/2011 Document Reviewed: 03/04/2011 [facility] Patient Information 2015 [place], [place]. This information is not intended to replace advice given to you by your health care provider. Make sure you discuss any questions you have with your health care provider. Allergic Rhinitis Allergic rhinitis is when the mucous membranes in the nose respond to allergens. Allergens are particles in the air that cause your body to have an allergic reaction. This causes you to release allergic antibodies. Through a chain of events, these eventually cause you to release histamine into the blood stream. Although meant to protect the body, it is this release of histamine that causes your discomfort, such as frequent sneezing, congestion, and an itchy, runny nose.  CAUSES  Seasonal allergic rhinitis (hay fever) is caused by pollen allergens that may come from grasses, trees, and weeds. Year-round allergic rhinitis (perennial allergic rhinitis) is caused by allergens such as house dust mites, pet dander, and mold spores.  SYMPTOMS   Nasal stuffiness (congestion).  Itchy, runny nose with sneezing and tearing of the eyes. DIAGNOSIS  Your health care provider can help you determine the allergen or allergens that trigger your symptoms. If you and your health care provider are unable to determine the allergen, skin or blood testing may be used. TREATMENT  Allergic rhinitis does  not have a cure, but it can be controlled by:  Medicines  and allergy shots (immunotherapy).  Avoiding the allergen. Hay fever may often be treated with antihistamines in pill or nasal spray forms. Antihistamines block the effects of histamine. There are over-the-counter medicines that may help with nasal congestion and swelling around the eyes. Check with your health care provider before taking or giving this medicine.  If avoiding the allergen or the medicine prescribed do not work, there are many new medicines your health care provider can prescribe. Stronger medicine may be used if initial measures are ineffective. Desensitizing injections can be used if medicine and avoidance does not work. Desensitization is when a patient is given ongoing shots until the body becomes less sensitive to the allergen. Make sure you follow up with your health care provider if problems continue. HOME CARE INSTRUCTIONS It is not possible to completely avoid allergens, but you can reduce your symptoms by taking steps to limit your exposure to them. It helps to know exactly what you are allergic to so that you can avoid your specific triggers. SEEK MEDICAL CARE IF:   You have a fever.  You develop a cough that does not stop easily (persistent).  You have shortness of breath.  You start wheezing.  Symptoms interfere with normal daily activities. Document Released: 12/13/2000 Document Revised: 03/25/2013 Document Reviewed: 11/25/2012 [facility] Patient Information 2015 [place], [place]. This information is not intended to replace advice given to you by your health care provider. Make sure you discuss any questions you have with your health care provider.
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
## 2013-12-11 ENCOUNTER — Encounter: Payer: Self-pay | Admitting: Family Medicine

## 2013-12-11 LAB — COMPLETE METABOLIC PANEL WITH GFR
ALK PHOS: 98 U/L (ref 39–117)
ALT: 27 U/L (ref 0–53)
AST: 30 U/L (ref 0–37)
Albumin: 4.5 g/dL (ref 3.5–5.2)
BUN: 13 mg/dL (ref 6–23)
CO2: 24 mEq/L (ref 19–32)
CREATININE: 1.05 mg/dL (ref 0.50–1.35)
Calcium: 10 mg/dL (ref 8.4–10.5)
Chloride: 101 mEq/L (ref 96–112)
GFR, Est African American: >89 mL/min
GFR, Est Non African American: 84 mL/min
Glucose, Bld: 68 mg/dL — ABNORMAL LOW (ref 70–99)
Potassium: 4.2 mEq/L (ref 3.5–5.3)
Sodium: 135 mEq/L (ref 135–145)
Total Bilirubin: 0.8 mg/dL (ref 0.2–1.2)
Total Protein: 7.7 g/dL (ref 6.0–8.3)

## 2014-01-13 ENCOUNTER — Telehealth: Payer: Self-pay | Admitting: Hematology

## 2014-01-13 NOTE — Telephone Encounter (Signed)
Spouse states patient is still experience allergy symptoms despite taking levocetirizine (XYZAL) 5 MG tablet as prescribed.  Per spouse patient would like something stronger if possible.

## 2014-01-13 NOTE — Telephone Encounter (Signed)
Please have patient schedule an acute appointment for allergic rhinitis

## 2014-01-14 ENCOUNTER — Encounter: Payer: Self-pay | Admitting: Cardiology

## 2014-01-15 ENCOUNTER — Telehealth (HOSPITAL_COMMUNITY): Payer: Self-pay | Admitting: Hematology

## 2014-01-15 NOTE — Telephone Encounter (Signed)
Left message on machine. Explained the call was in follow up to telephone request on 01/13/2014.  Advised that patient needed to call the office to make a follow up appointment per [place] Hollis, [place].

## 2014-03-03 ENCOUNTER — Other Ambulatory Visit: Payer: Self-pay | Admitting: Cardiology

## 2014-03-12 ENCOUNTER — Encounter: Payer: Medicare Other | Admitting: Internal Medicine

## 2014-06-03 ENCOUNTER — Encounter: Payer: Self-pay | Admitting: *Deleted

## 2014-06-10 ENCOUNTER — Other Ambulatory Visit: Payer: Self-pay

## 2014-06-10 MED ORDER — ATORVASTATIN CALCIUM 80 MG PO TABS: 80.0000 mg | ORAL_TABLET | Freq: Every day | ORAL | Status: DC

## 2014-06-28 IMAGING — CT CT ABD-PELV W/ CM
1 of 3 series · 14 of 32 positions shown, 19 images · IV contrast (100 ML OMNI 300)
Comparison: 03/13/2009

CLINICAL DATA: Increasing abdominal pain for 3 days.  Nausea and
vomiting.

CT ABDOMEN AND PELVIS WITH CONTRAST
TECHNIQUE: Multidetector CT imaging of the abdomen and pelvis was
performed following the standard protocol during bolus
administration of intravenous contrast.
Contrast: 100mL OMNIPAQUE IOHEXOL 300 MG/ML  SOLN

[Series 2: abd/pel with · axial · 0.74mm/px · z∈[-290,+95]mm · 14 of 87 slices shown, 19 images]
[im 5/87  soft-tissue]
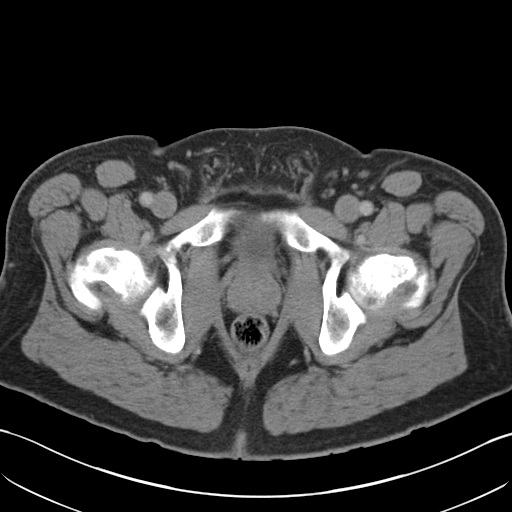
[im 5/87  bone]
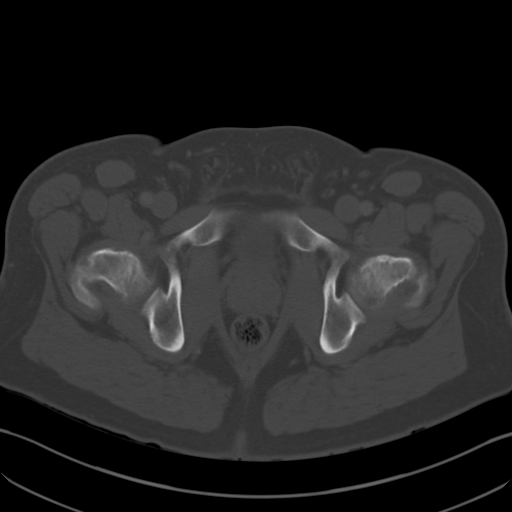
[im 10/87  soft-tissue]
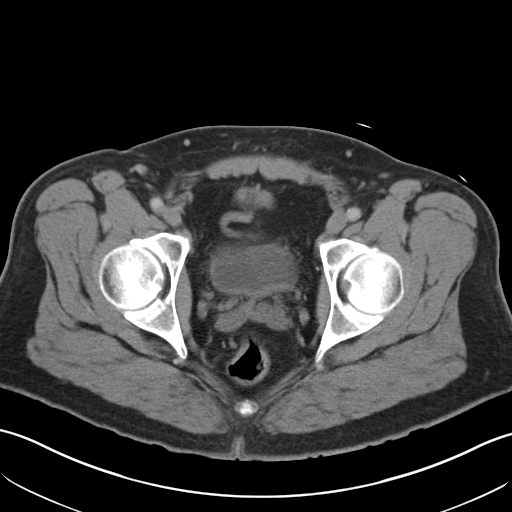
[im 20/87  soft-tissue]
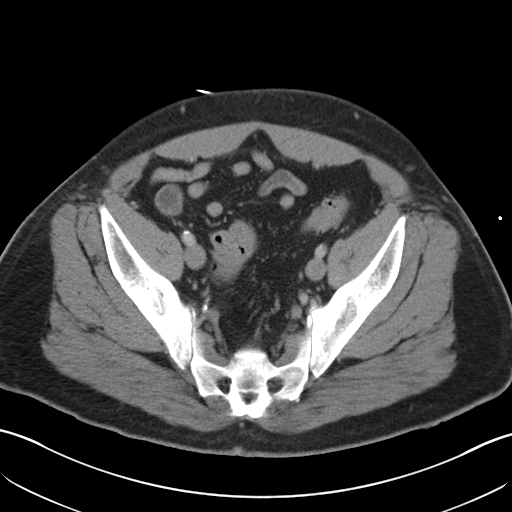
[im 24/87  soft-tissue]
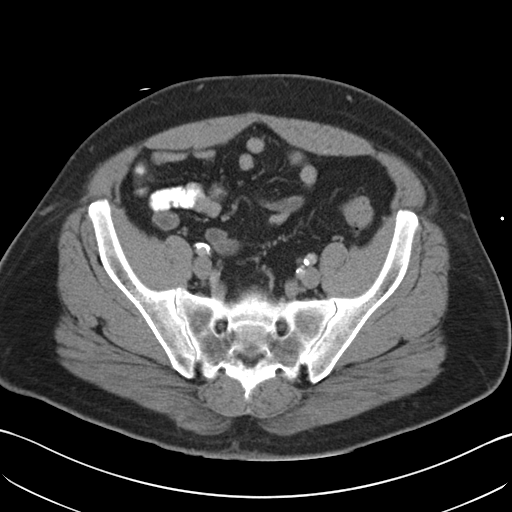
[im 29/87  soft-tissue]
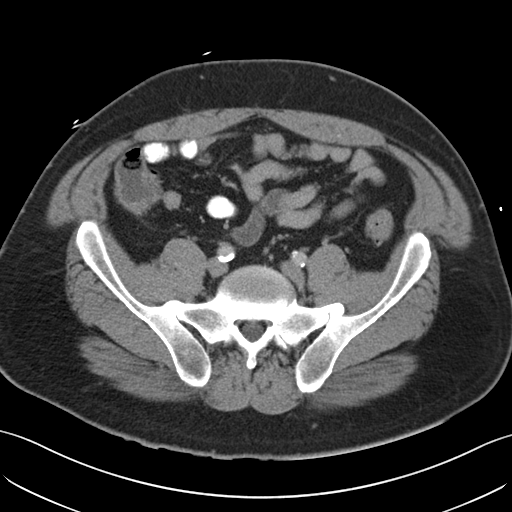
[im 39/87  soft-tissue]
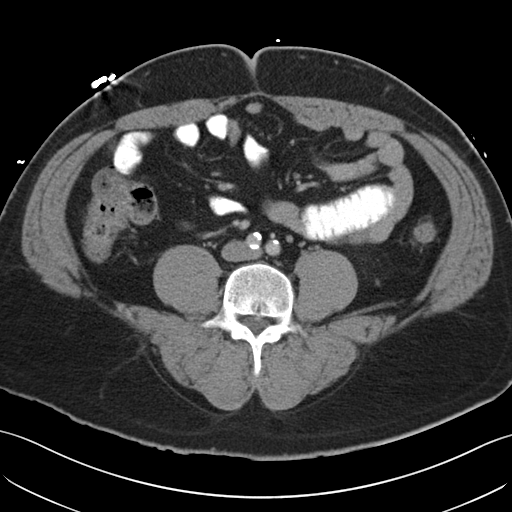
[im 44/87  soft-tissue]
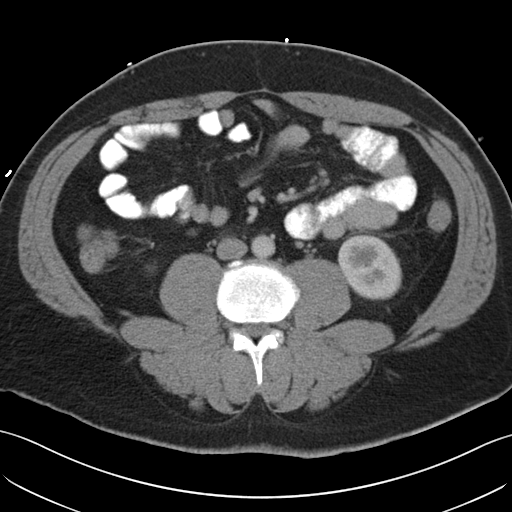
[im 48/87  soft-tissue]
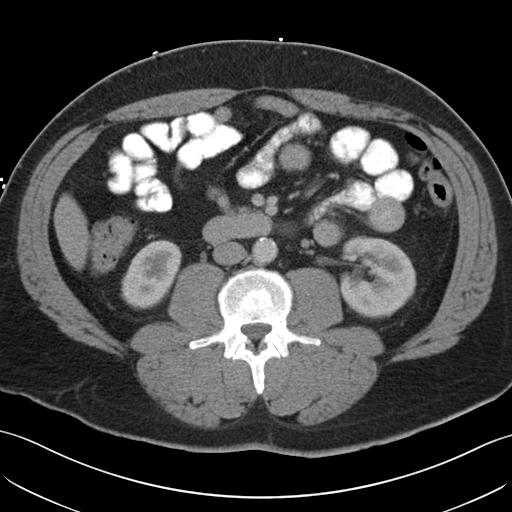
[im 58/87  soft-tissue]
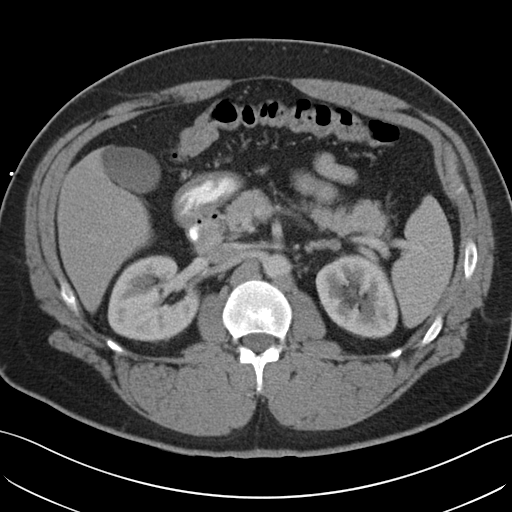
[im 58/87  bone]
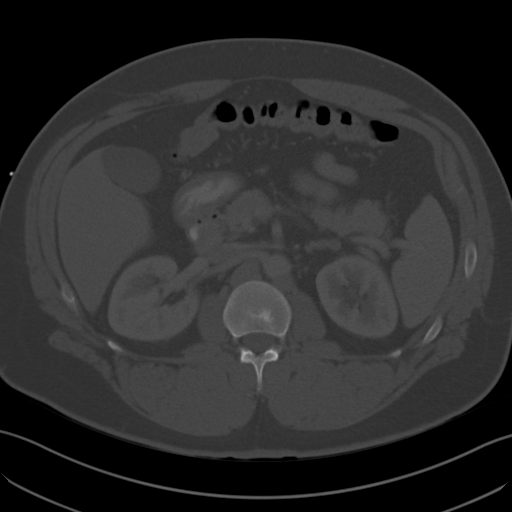
[im 63/87  soft-tissue]
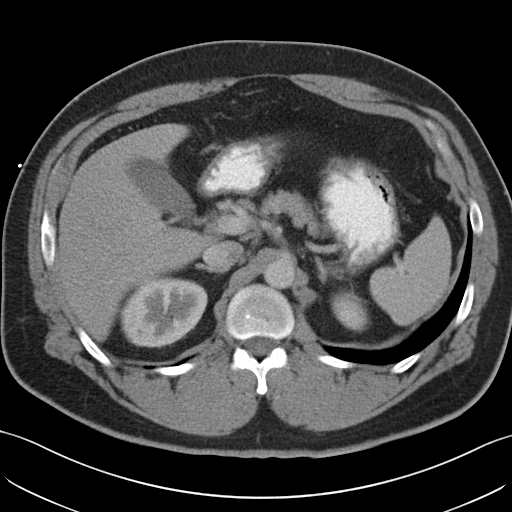
[im 67/87  soft-tissue]
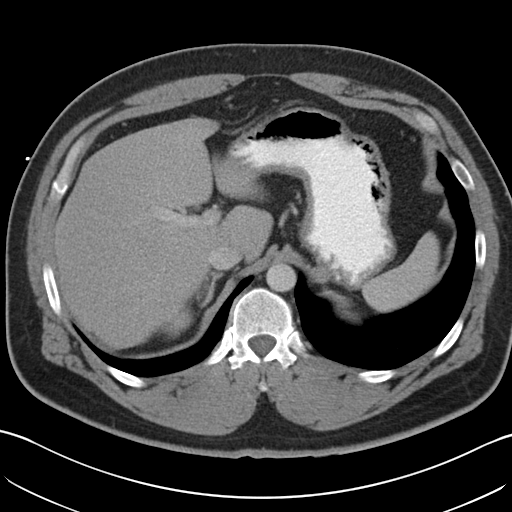
[im 67/87  lung]
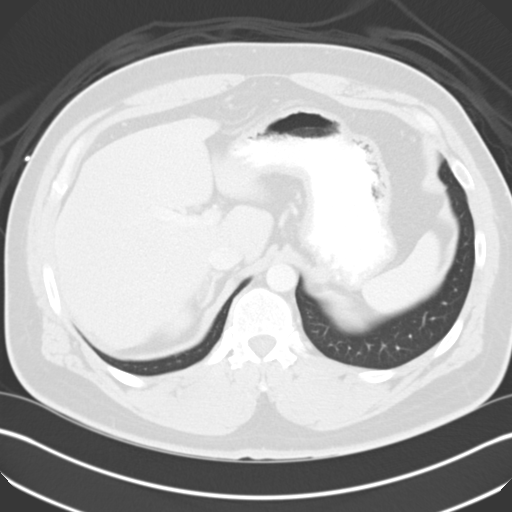
[im 72/87  lung]
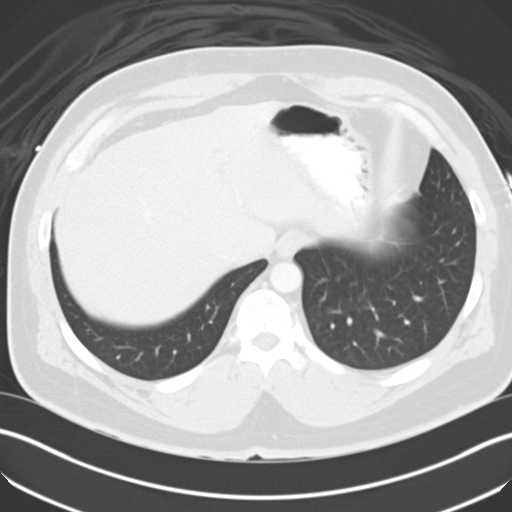
[im 77/87  soft-tissue]
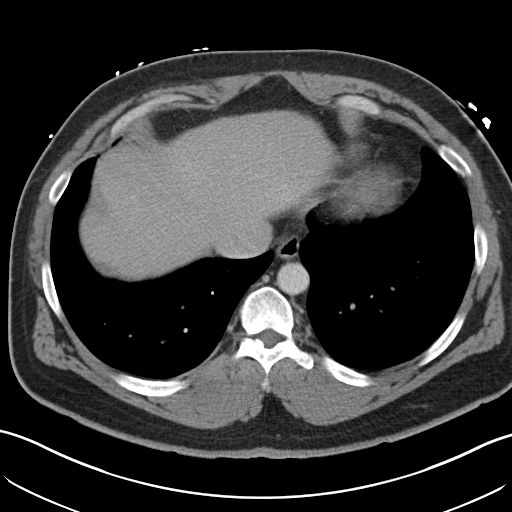
[im 77/87  lung]
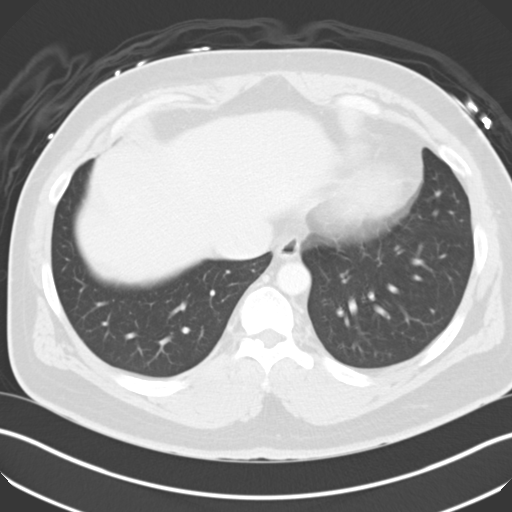
[im 82/87  soft-tissue]
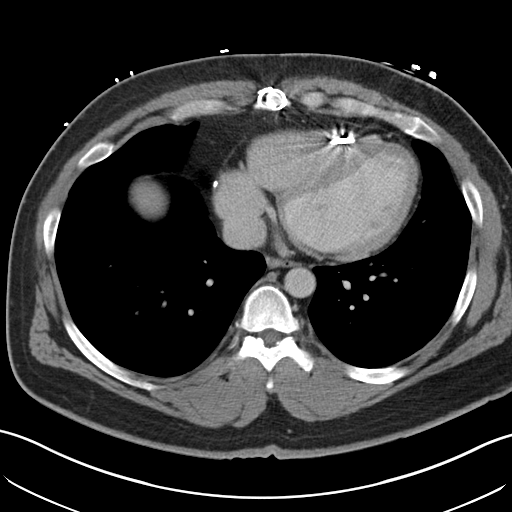
[im 82/87  lung]
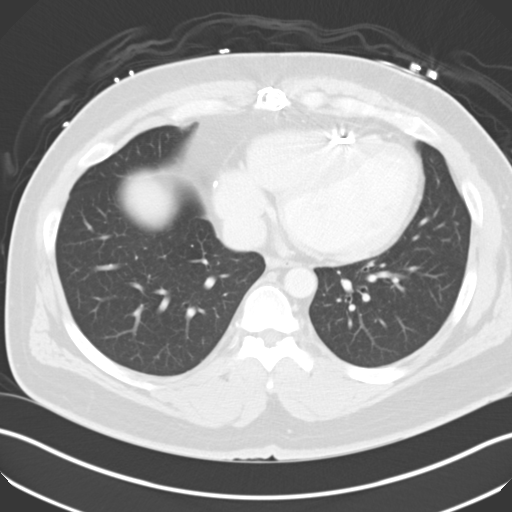

[14 of 32 positions shown; findings below may reference images not displayed]

FINDINGS: The lung bases are clear.  Postoperative changes in the
mediastinum.  Cardiac pacemaker wires.

The liver, spleen, gallbladder, pancreas, adrenal glands, kidneys,
abdominal aorta, and retroperitoneal lymph nodes are unremarkable
except for vascular calcifications.  The stomach, small bowel, and
colon are not abnormally distended.  No appreciable wall
thickening.  No free air or free fluid in the abdomen.

Pelvis:  The appendix is normal.  No evidence of diverticulitis.
Prostate gland is not enlarged.  Mild bladder wall thickening is
likely due to incomplete distension.  No significant pelvic
lymphadenopathy.  No free or loculated pelvic fluid collections.
Degenerative changes in the lumbar spine.
IMPRESSION: No acute process demonstrated in the abdomen or pelvis.

## 2014-06-30 ENCOUNTER — Other Ambulatory Visit: Payer: Self-pay

## 2014-06-30 MED ORDER — LISINOPRIL 20 MG PO TABS: 20.0000 mg | ORAL_TABLET | Freq: Every morning | ORAL | Status: DC

## 2014-06-30 NOTE — Telephone Encounter (Signed)
Rx(s) sent to pharmacy electronically.  

## 2014-07-22 ENCOUNTER — Emergency Department (HOSPITAL_BASED_OUTPATIENT_CLINIC_OR_DEPARTMENT_OTHER)
Admission: EM | Admit: 2014-07-22 | Discharge: 2014-07-22 | Disposition: A | Discharge status: Home / Self Care | Payer: Medicare Other | Attending: Emergency Medicine | Admitting: Emergency Medicine

## 2014-07-22 ENCOUNTER — Encounter (HOSPITAL_BASED_OUTPATIENT_CLINIC_OR_DEPARTMENT_OTHER): Payer: Self-pay | Admitting: *Deleted

## 2014-07-22 ENCOUNTER — Emergency Department (HOSPITAL_BASED_OUTPATIENT_CLINIC_OR_DEPARTMENT_OTHER): Payer: Medicare Other

## 2014-07-22 DIAGNOSIS — I509 Heart failure, unspecified: Secondary | ICD-10-CM | POA: Diagnosis not present

## 2014-07-22 DIAGNOSIS — Z7952 Long term (current) use of systemic steroids: Secondary | ICD-10-CM | POA: Insufficient documentation

## 2014-07-22 DIAGNOSIS — R05 Cough: Secondary | ICD-10-CM | POA: Diagnosis present

## 2014-07-22 DIAGNOSIS — J40 Bronchitis, not specified as acute or chronic: Secondary | ICD-10-CM | POA: Diagnosis not present

## 2014-07-22 DIAGNOSIS — I2510 Atherosclerotic heart disease of native coronary artery without angina pectoris: Secondary | ICD-10-CM | POA: Diagnosis not present

## 2014-07-22 DIAGNOSIS — Z7982 Long term (current) use of aspirin: Secondary | ICD-10-CM | POA: Diagnosis not present

## 2014-07-22 DIAGNOSIS — I252 Old myocardial infarction: Secondary | ICD-10-CM | POA: Insufficient documentation

## 2014-07-22 DIAGNOSIS — E785 Hyperlipidemia, unspecified: Secondary | ICD-10-CM | POA: Insufficient documentation

## 2014-07-22 DIAGNOSIS — Z79899 Other long term (current) drug therapy: Secondary | ICD-10-CM | POA: Diagnosis not present

## 2014-07-22 DIAGNOSIS — I10 Essential (primary) hypertension: Secondary | ICD-10-CM | POA: Insufficient documentation

## 2014-07-22 DIAGNOSIS — Z87891 Personal history of nicotine dependence: Secondary | ICD-10-CM | POA: Insufficient documentation

## 2014-07-22 DIAGNOSIS — Z8669 Personal history of other diseases of the nervous system and sense organs: Secondary | ICD-10-CM | POA: Diagnosis not present

## 2014-07-22 DIAGNOSIS — Z951 Presence of aortocoronary bypass graft: Secondary | ICD-10-CM | POA: Diagnosis not present

## 2014-07-22 DIAGNOSIS — J209 Acute bronchitis, unspecified: Secondary | ICD-10-CM | POA: Diagnosis not present

## 2014-07-22 DIAGNOSIS — Z8719 Personal history of other diseases of the digestive system: Secondary | ICD-10-CM | POA: Diagnosis not present

## 2014-07-22 DIAGNOSIS — Z792 Long term (current) use of antibiotics: Secondary | ICD-10-CM | POA: Diagnosis not present

## 2014-07-22 DIAGNOSIS — R509 Fever, unspecified: Secondary | ICD-10-CM | POA: Diagnosis not present

## 2014-07-22 LAB — CBC WITH DIFFERENTIAL/PLATELET
BAND NEUTROPHILS: 0 % (ref 0–10)
BASOS PCT: 0 % (ref 0–1)
Basophils Absolute: 0 10*3/uL (ref 0.0–0.1)
Blasts: 0 %
EOS ABS: 0.5 10*3/uL (ref 0.0–0.7)
EOS PCT: 3 % (ref 0–5)
HCT: 39.6 % (ref 39.0–52.0)
HEMOGLOBIN: 14.2 g/dL (ref 13.0–17.0)
Lymphocytes Relative: 15 % (ref 12–46)
Lymphs Abs: 2.4 10*3/uL (ref 0.7–4.0)
MCH: 28.9 pg (ref 26.0–34.0)
MCHC: 35.9 g/dL (ref 30.0–36.0)
MCV: 80.7 fL (ref 78.0–100.0)
METAMYELOCYTES PCT: 0 %
MYELOCYTES: 0 %
Monocytes Absolute: 0.7 10*3/uL (ref 0.1–1.0)
Monocytes Relative: 4 % (ref 3–12)
Neutro Abs: 12.7 10*3/uL — ABNORMAL HIGH (ref 1.7–7.7)
Neutrophils Relative %: 78 % — ABNORMAL HIGH (ref 43–77)
PLATELETS: 489 10*3/uL — AB (ref 150–400)
Promyelocytes Absolute: 0 %
RBC: 4.91 MIL/uL (ref 4.22–5.81)
RDW: 14.8 % (ref 11.5–15.5)
WBC: 16.3 10*3/uL — ABNORMAL HIGH (ref 4.0–10.5)
nRBC: 0 /100 WBC

## 2014-07-22 MED ORDER — DOXYCYCLINE HYCLATE 100 MG PO CAPS: 100.0000 mg | ORAL_CAPSULE | Freq: Two times a day (BID) | ORAL | Status: DC

## 2014-07-22 MED ORDER — ALBUTEROL SULFATE HFA 108 (90 BASE) MCG/ACT IN AERS: 1.0000 | INHALATION_SPRAY | Freq: Four times a day (QID) | RESPIRATORY_TRACT | Status: DC | PRN

## 2014-07-22 MED ORDER — PREDNISONE 20 MG PO TABS: ORAL_TABLET | ORAL | Status: DC

## 2014-07-22 NOTE — ED Notes (Signed)
Patient states he has a one week history of congested productive cough with dark brown secretions, which is associated with temperature up to 103.  Wife recently diagnosed with pneumonia.  States his symptoms started as a headache, sinus drainage, sore throat and body aches.

## 2014-07-22 NOTE — ED Notes (Signed)
Patient transported to X-ray 

## 2014-07-22 NOTE — ED Provider Notes (Signed)
CSN: [id number]     Arrival date & time 07/22/14  3614 History   First MD Initiated Contact with Patient 07/22/14 1149     Chief Complaint  Patient presents with  . Cough     (Consider location/radiation/quality/duration/timing/severity/associated sxs/prior Treatment) Patient is a 49 y.o. male presenting with cough. The history is provided by the patient. No language interpreter was used.  Cough Cough characteristics:  Productive Sputum characteristics:  [place] Severity:  Moderate Onset quality:  Gradual Duration:  1 week Timing:  Constant Progression:  Worsening Chronicity:  New Smoker: yes   Context: sick contacts and smoke exposure   Relieved by:  Nothing Worsened by:  Nothing tried Ineffective treatments:  None tried Associated symptoms: fever   Associated symptoms: no chest pain, no headaches, no rash, no rhinorrhea, no shortness of breath, no sinus congestion and no wheezing     Past Medical History  Diagnosis Date  . Cardiac arrest - ventricular fibrillation 02/11/2009    SUCCESSFUL PLACEMENT OF A [employer] [place] FORTIFY SINGLE LEAD AUTOMATIC IMPLANTABLE CARDIOVERTER-DEFIBRILLATOR  . Coronary artery disease     s/p CABG  . Myocardial infarct 02/2009    NON-ST-SEGMENT ELEVATION MI  . Hypertension   . Hyperlipidemia   . Ischemic cardiomyopathy 02/09/2009    EF 40-45%  . Anoxic encephalopathy   . Marijuana abuse   . Peripheral vascular disease, unspecified   . Blood in stool   . CHF (congestive heart failure)     CHRONIC SYSTOLIC ; EF 43-15%   Past Surgical History  Procedure Laterality Date  . Circumcision    . Coronary artery bypass graft  06/17/01    X 4  . Insert / replace / remove pacemaker      IMPLANTATION AICD-[employer]. JUDE FORTIFY  . Cardiac surgery     Family History  Problem Relation Age of Onset  . Hypertension Mother   . Heart attack Father     DIED AT 19 FROM MI  . Heart disease Sister     CAD AND PREVIOUS CABG  . Hypertension      IN MOST  OF HIS SIBLINGS   History  Substance Use Topics  . Smoking status: Former Smoker -- 2.00 packs/day for 20 years    Quit date: 04/04/2003  . Smokeless tobacco: Never Used  . Alcohol Use: No    Review of Systems  Constitutional: Positive for fever. Negative for activity change, appetite change and fatigue.  HENT: Positive for congestion. Negative for facial swelling, rhinorrhea and trouble swallowing.   Eyes: Negative for photophobia and pain.  Respiratory: Positive for cough. Negative for chest tightness, shortness of breath and wheezing.   Cardiovascular: Negative for chest pain and leg swelling.  Gastrointestinal: Negative for nausea, vomiting, abdominal pain, diarrhea and constipation.  Endocrine: Negative for polydipsia and polyuria.  Genitourinary: Negative for dysuria, urgency, decreased urine volume and difficulty urinating.  Musculoskeletal: Negative for back pain and gait problem.  Skin: Negative for color change, rash and wound.  Allergic/Immunologic: Negative for immunocompromised state.  Neurological: Negative for dizziness, facial asymmetry, speech difficulty, weakness, numbness and headaches.  Psychiatric/Behavioral: Negative for confusion, decreased concentration and agitation.      Allergies  Review of patient's allergies indicates no known allergies.  Home Medications   Prior to Admission medications   Medication Sig Start Date End Date Taking? Authorizing Provider  aspirin 81 MG EC tablet Take 81 mg by mouth every morning.    Yes Historical Provider, MD  atorvastatin (LIPITOR)  80 MG tablet Take 1 tablet (80 mg total) by mouth daily at 6 PM. 06/10/14  Yes Lelon Perla, MD  carvedilol (COREG) 25 MG tablet Take 1 tablet (25 mg total) by mouth 2 (two) times daily with a meal. 08/07/13  Yes Lelon Perla, MD  furosemide (LASIX) 20 MG tablet Take 1 tablet (20 mg total) by mouth daily. 08/07/13  Yes Lelon Perla, MD  levocetirizine (XYZAL) 5 MG tablet Take 0.5  tablets (2.5 mg total) by mouth every evening. 12/10/13  Yes Dorena Dew, FNP  lisinopril (PRINIVIL,ZESTRIL) 20 MG tablet Take 1 tablet (20 mg total) by mouth every morning. 06/30/14  Yes Lelon Perla, MD  Multiple Vitamin (MULTIVITAMIN) tablet Take 1 tablet by mouth every morning.    Yes Historical Provider, MD  NEXIUM 40 MG capsule take 1 capsule by mouth once daily 03/04/14  Yes Lelon Perla, MD  potassium chloride SA (K-DUR,KLOR-CON) 20 MEQ tablet take 1 tablet by mouth once daily   Yes Lelon Perla, MD  albuterol (PROVENTIL HFA;VENTOLIN HFA) 108 (90 BASE) MCG/ACT inhaler Inhale 1-2 puffs into the lungs every 6 (six) hours as needed for wheezing or shortness of breath. 07/22/14   Ernestina Patches, MD  cyclobenzaprine (FLEXERIL) 5 MG tablet Take 1 tablet (5 mg total) by mouth 3 (three) times daily as needed for muscle spasms. 12/10/13   Dorena Dew, FNP  doxycycline (VIBRAMYCIN) 100 MG capsule Take 1 capsule (100 mg total) by mouth 2 (two) times daily. 07/22/14   Ernestina Patches, MD  ibuprofen (ADVIL,MOTRIN) 800 MG tablet Take 1 tablet (800 mg total) by mouth every 8 (eight) hours as needed. 12/10/13   Dorena Dew, FNP  predniSONE (DELTASONE) 20 MG tablet 3 tabs po day one, then 2 po daily x 4 days 07/22/14   Ernestina Patches, MD   BP 130/75 mmHg  Pulse 83  Temp(Src) 98.2 F (36.8 C) (Oral)  Resp 20  Wt 229 lb (103.874 kg)  SpO2 97% Physical Exam  Constitutional: He is oriented to person, place, and time. He appears well-developed and well-nourished. No distress.  HENT:  Head: Normocephalic and atraumatic.  Mouth/Throat: No oropharyngeal exudate.  Eyes: Pupils are equal, round, and reactive to light.  Neck: Normal range of motion. Neck supple.  Cardiovascular: Normal rate, regular rhythm and normal heart sounds.  Exam reveals no gallop and no friction rub.   No murmur heard. Pulmonary/Chest: Effort normal. No respiratory distress. He has wheezes in the right lower field and  the left lower field. He has no rales.  Abdominal: Soft. Bowel sounds are normal. He exhibits no distension and no mass. There is no tenderness. There is no rebound and no guarding.  Musculoskeletal: Normal range of motion. He exhibits no edema or tenderness.  Neurological: He is alert and oriented to person, place, and time.  Skin: Skin is warm and dry.  Psychiatric: He has a normal mood and affect.    ED Course  Procedures (including critical care time) Labs Review Labs Reviewed  CBC WITH DIFFERENTIAL/PLATELET - Abnormal; Notable for the following:    WBC 16.3 (*)    Platelets 489 (*)    Neutrophils Relative % 78 (*)    Neutro Abs 12.7 (*)    All other components within normal limits  PATHOLOGIST SMEAR REVIEW    Imaging Review Dg Chest 2 View  07/22/2014   CLINICAL DATA:  Cough, and fever since last Wednesday, sore throat, former smoker  EXAM:  CHEST  2 VIEW  COMPARISON:  05/30/2012  FINDINGS: LEFT subclavian AICD lead tip projects at RIGHT ventricle.  Upper normal heart size post CABG.  Mediastinal contours and pulmonary vascularity normal.  Chronic peribronchial thickening without infiltrate, pleural effusion or pneumothorax.  Osseous structures unremarkable.  IMPRESSION: Chronic bronchitic changes.  Post CABG and AICD.  No acute abnormalities.   Electronically Signed   By: Lavonia Dana M.D.   On: 07/22/2014 09:55     EKG Interpretation None      MDM   Final diagnoses:  Bronchitis    Pt is a 49 y.o. male with Pmhx as above who presents with  1 week of productive cough with brown sputum without shortness of breath.  He has had some mild chest tightness, just with cough.  He's had no leg pain or swelling.  He has sick contacts at home including a wife, recently diagnosed with pneumonia.  On physical exam, vital signs are stable and patient is in no acute distress.  He has forced end expiratory wheezing at lung bases.  Checks x-ray with chronic bronchitic changes.  CBC with  leukocytosis.  Suspect acute bronchitis.  Given patient's long-standing smoking history, we'll be placed on doxycycline and prednisone.      [facility] evaluation in the Emergency Department is complete. It has been determined that no acute conditions requiring further emergency intervention are present at this time. The patient/guardian have been advised of the diagnosis and plan. We have discussed signs and symptoms that warrant return to the ED, such as changes or worsening in symptoms, chest pain, shortness of breath, inability to tolerate liquids or failure of symptom improvement.      Ernestina Patches, MD 07/22/14 1723

## 2014-07-22 NOTE — ED Notes (Signed)
MD at bedside. 

## 2014-07-22 NOTE — Discharge Instructions (Signed)

## 2014-07-23 LAB — PATHOLOGIST SMEAR REVIEW

## 2014-08-31 ENCOUNTER — Other Ambulatory Visit: Payer: Self-pay | Admitting: Cardiology

## 2014-09-25 ENCOUNTER — Encounter: Payer: Self-pay | Admitting: Internal Medicine

## 2014-09-25 ENCOUNTER — Ambulatory Visit (INDEPENDENT_AMBULATORY_CARE_PROVIDER_SITE_OTHER): Payer: Medicare Other | Admitting: Internal Medicine

## 2014-09-25 VITALS — BP 126/76 | HR 63 | Ht 72.0 in | Wt 234.0 lb

## 2014-09-25 DIAGNOSIS — I255 Ischemic cardiomyopathy: Secondary | ICD-10-CM

## 2014-09-25 DIAGNOSIS — I469 Cardiac arrest, cause unspecified: Secondary | ICD-10-CM

## 2014-09-25 DIAGNOSIS — Z4502 Encounter for adjustment and management of automatic implantable cardiac defibrillator: Secondary | ICD-10-CM

## 2014-09-25 DIAGNOSIS — Z79899 Other long term (current) drug therapy: Secondary | ICD-10-CM

## 2014-09-25 LAB — CUP PACEART INCLINIC DEVICE CHECK
Battery Remaining Longevity: 66 mo
Date Time Interrogation Session: 20160624133255
HighPow Impedance: 53 Ohm
Lead Channel Impedance Value: 525 Ohm
Lead Channel Pacing Threshold Amplitude: 0.75 V
Lead Channel Pacing Threshold Amplitude: 0.75 V
Lead Channel Pacing Threshold Pulse Width: 0.5 ms
Lead Channel Pacing Threshold Pulse Width: 0.5 ms
Lead Channel Setting Pacing Amplitude: 2.5 V
Lead Channel Setting Pacing Pulse Width: 0.5 ms
Lead Channel Setting Sensing Sensitivity: 0.5 mV
MDC IDC MSMT LEADCHNL RV SENSING INTR AMPL: 12 mV
MDC IDC PG SERIAL: 743367
MDC IDC SET ZONE DETECTION INTERVAL: 300 ms
MDC IDC STAT BRADY RV PERCENT PACED: 0 %
Zone Setting Detection Interval: 250 ms

## 2014-09-25 LAB — BASIC METABOLIC PANEL
BUN: 12 mg/dL (ref 6–23)
CALCIUM: 9.6 mg/dL (ref 8.4–10.5)
CHLORIDE: 106 meq/L (ref 96–112)
CO2: 30 meq/L (ref 19–32)
Creatinine, Ser: 1.07 mg/dL (ref 0.40–1.50)
GFR: 94.72 mL/min (ref >60.00)
Glucose, Bld: 101 mg/dL — ABNORMAL HIGH (ref 70–99)
Potassium: 4.2 mEq/L (ref 3.5–5.1)
SODIUM: 139 meq/L (ref 135–145)

## 2014-09-25 NOTE — Progress Notes (Signed)
Patient Care Team: Provider Default, MD as PCP - General   HPI  Billy [name] is a 49 y.o. male Seen in followup for an ICD implanted following a cardiac arrest fall 2011. His history of ischemic heart disease and prior bypass grafting and had declined ICD implantation prior to his arrest.   A recent Myoview demonstrating ejection fraction 38% with inferolateral hypokinesis   The patient denies chest pain, shortness of breath, nocturnal dyspnea, orthopnea or peripheral edema. There have been no palpitations, lightheadedness or syncope.    Past Medical History  Diagnosis Date  . Cardiac arrest - ventricular fibrillation 02/11/2009    SUCCESSFUL PLACEMENT OF A [employer] [place] FORTIFY SINGLE LEAD AUTOMATIC IMPLANTABLE CARDIOVERTER-DEFIBRILLATOR  . Coronary artery disease     s/p CABG  . Myocardial infarct 02/2009    NON-ST-SEGMENT ELEVATION MI  . Hypertension   . Hyperlipidemia   . Ischemic cardiomyopathy 02/09/2009    EF 40-45%  . Anoxic encephalopathy   . Marijuana abuse   . Peripheral vascular disease, unspecified   . Blood in stool   . CHF (congestive heart failure)     CHRONIC SYSTOLIC ; EF 51-02%    Past Surgical History  Procedure Laterality Date  . Circumcision    . Coronary artery bypass graft  06/17/01    X 4  . Insert / replace / remove pacemaker      IMPLANTATION AICD-[employer] JUDE FORTIFY  . Cardiac surgery      Current Outpatient Prescriptions  Medication Sig Dispense Refill  . albuterol (PROVENTIL HFA;VENTOLIN HFA) 108 (90 BASE) MCG/ACT inhaler Inhale 1-2 puffs into the lungs every 6 (six) hours as needed for wheezing or shortness of breath. 1 Inhaler 0  . aspirin 81 MG EC tablet Take 81 mg by mouth every morning.     [name] atorvastatin (LIPITOR) 80 MG tablet Take 1 tablet (80 mg total) by mouth daily at 6 PM. 90 tablet 1  . carvedilol (COREG) 25 MG tablet take 1 tablet by mouth twice a day with meals 60 tablet 0  . furosemide (LASIX) 20 MG tablet take 1  tablet by mouth once daily 30 tablet 0  . ibuprofen (ADVIL,MOTRIN) 800 MG tablet Take 1 tablet (800 mg total) by mouth every 8 (eight) hours as needed. 30 tablet 0  . lisinopril (PRINIVIL,ZESTRIL) 20 MG tablet Take 1 tablet (20 mg total) by mouth every morning. 90 tablet 0  . Multiple Vitamin (MULTIVITAMIN) tablet Take 1 tablet by mouth every morning.     [name] NEXIUM 40 MG capsule take 1 capsule by mouth once daily 30 capsule 0  . potassium chloride SA (K-DUR,KLOR-CON) 20 MEQ tablet take 1 tablet by mouth once daily 30 tablet 10   No current facility-administered medications for this visit.    No Known Allergies  Review of Systems negative except from HPI and PMH  Physical Exam BP 126/76 mmHg  Pulse 63  Ht 6' (1.829 m)  Wt 234 lb (106.142 kg)  BMI 31.73 kg/m2 Well developed and well nourished in no acute distress HENT normal E scleral and icterus clear Neck Supple JVP flat; carotids brisk and full Clear to ausculation Device pocket well healed; without hematoma or erythema.  There is no tethering *Regular rate and rhythm, no murmurs gallops or rub Soft with active bowel sounds No clubbing cyanosis  Edema Alert and oriented, grossly normal motor and sensory function Skin Warm and Dry  ECG demonstrates sinus rhythm at 63 Intervals 14/09/40  Otherwise normal  Assessment and  Plan  Aborted cardiac arrest  Ischemic heart disease with prior bypass surgery  Defibrillator-[employer] The patient's device was interrogated.  The information was reviewed. No changes were made in the programming.     No intercurrent ventricular arrhythmias. Continue current medications  We will we will check a metabolic profile.  Euvolemic continue current meds\  Last labs were 9/15.

## 2014-09-25 NOTE — Patient Instructions (Signed)
Medication Instructions:  Your physician recommends that you continue on your current medications as directed. Please refer to the Current Medication list given to you today.  Labwork: BMET today  Testing/Procedures: None ordered  Follow-Up: Remote monitoring is used to monitor your Pacemaker of ICD from home. This monitoring reduces the number of office visits required to check your device to one time per year. It allows [place] to keep an eye on the functioning of your device to ensure it is working properly. You are scheduled for a device check from home on 12/28/14. You may send your transmission at any time that day. If you have a wireless device, the transmission will be sent automatically. After your physician reviews your transmission, you will receive a postcard with your next transmission date.  Your physician wants you to follow-up in: 1 year with Dr. Caryl Comes.  You will receive a reminder letter in the mail two months in advance. If you don't receive a letter, please call our office to schedule the follow-up appointment.  Thank you for choosing [facility]!!

## 2014-10-01 ENCOUNTER — Other Ambulatory Visit: Payer: Self-pay | Admitting: Cardiology

## 2014-10-01 ENCOUNTER — Other Ambulatory Visit: Payer: Self-pay

## 2014-10-01 MED ORDER — CARVEDILOL 25 MG PO TABS: 25.0000 mg | ORAL_TABLET | Freq: Two times a day (BID) | ORAL | Status: DC

## 2014-10-01 MED ORDER — POTASSIUM CHLORIDE CRYS ER 20 MEQ PO TBCR: 20.0000 meq | EXTENDED_RELEASE_TABLET | Freq: Every day | ORAL | Status: DC

## 2014-10-01 MED ORDER — LISINOPRIL 20 MG PO TABS: 20.0000 mg | ORAL_TABLET | Freq: Every morning | ORAL | Status: DC

## 2014-10-01 MED ORDER — FUROSEMIDE 20 MG PO TABS: 20.0000 mg | ORAL_TABLET | Freq: Every day | ORAL | Status: DC

## 2014-10-01 MED ORDER — ESOMEPRAZOLE MAGNESIUM 40 MG PO CPDR: 40.0000 mg | DELAYED_RELEASE_CAPSULE | Freq: Every day | ORAL | Status: DC

## 2014-10-01 MED ORDER — ATORVASTATIN CALCIUM 80 MG PO TABS: 80.0000 mg | ORAL_TABLET | Freq: Every day | ORAL | Status: DC

## 2014-10-01 NOTE — Telephone Encounter (Signed)
Note 6.24.16

## 2014-11-02 ENCOUNTER — Emergency Department (HOSPITAL_BASED_OUTPATIENT_CLINIC_OR_DEPARTMENT_OTHER)
Admission: EM | Admit: 2014-11-02 | Discharge: 2014-11-02 | Disposition: A | Discharge status: Home / Self Care | Payer: Medicare Other | Attending: Emergency Medicine | Admitting: Emergency Medicine

## 2014-11-02 ENCOUNTER — Emergency Department (HOSPITAL_BASED_OUTPATIENT_CLINIC_OR_DEPARTMENT_OTHER): Payer: Medicare Other

## 2014-11-02 ENCOUNTER — Encounter (HOSPITAL_BASED_OUTPATIENT_CLINIC_OR_DEPARTMENT_OTHER): Payer: Self-pay | Admitting: *Deleted

## 2014-11-02 DIAGNOSIS — Z23 Encounter for immunization: Secondary | ICD-10-CM | POA: Insufficient documentation

## 2014-11-02 DIAGNOSIS — I10 Essential (primary) hypertension: Secondary | ICD-10-CM | POA: Insufficient documentation

## 2014-11-02 DIAGNOSIS — Z951 Presence of aortocoronary bypass graft: Secondary | ICD-10-CM | POA: Diagnosis not present

## 2014-11-02 DIAGNOSIS — Y9389 Activity, other specified: Secondary | ICD-10-CM | POA: Insufficient documentation

## 2014-11-02 DIAGNOSIS — Z7982 Long term (current) use of aspirin: Secondary | ICD-10-CM | POA: Insufficient documentation

## 2014-11-02 DIAGNOSIS — Z8719 Personal history of other diseases of the digestive system: Secondary | ICD-10-CM | POA: Diagnosis not present

## 2014-11-02 DIAGNOSIS — Z8669 Personal history of other diseases of the nervous system and sense organs: Secondary | ICD-10-CM | POA: Insufficient documentation

## 2014-11-02 DIAGNOSIS — Z8674 Personal history of sudden cardiac arrest: Secondary | ICD-10-CM | POA: Insufficient documentation

## 2014-11-02 DIAGNOSIS — Y288XXA Contact with other sharp object, undetermined intent, initial encounter: Secondary | ICD-10-CM | POA: Insufficient documentation

## 2014-11-02 DIAGNOSIS — E785 Hyperlipidemia, unspecified: Secondary | ICD-10-CM | POA: Diagnosis not present

## 2014-11-02 DIAGNOSIS — Z79899 Other long term (current) drug therapy: Secondary | ICD-10-CM | POA: Diagnosis not present

## 2014-11-02 DIAGNOSIS — S6992XA Unspecified injury of left wrist, hand and finger(s), initial encounter: Secondary | ICD-10-CM | POA: Diagnosis not present

## 2014-11-02 DIAGNOSIS — Z87891 Personal history of nicotine dependence: Secondary | ICD-10-CM | POA: Diagnosis not present

## 2014-11-02 DIAGNOSIS — I2510 Atherosclerotic heart disease of native coronary artery without angina pectoris: Secondary | ICD-10-CM | POA: Diagnosis not present

## 2014-11-02 DIAGNOSIS — Y998 Other external cause status: Secondary | ICD-10-CM | POA: Diagnosis not present

## 2014-11-02 DIAGNOSIS — S61211A Laceration without foreign body of left index finger without damage to nail, initial encounter: Secondary | ICD-10-CM | POA: Insufficient documentation

## 2014-11-02 DIAGNOSIS — I509 Heart failure, unspecified: Secondary | ICD-10-CM | POA: Insufficient documentation

## 2014-11-02 DIAGNOSIS — I252 Old myocardial infarction: Secondary | ICD-10-CM | POA: Diagnosis not present

## 2014-11-02 DIAGNOSIS — Y9289 Other specified places as the place of occurrence of the external cause: Secondary | ICD-10-CM | POA: Diagnosis not present

## 2014-11-02 DIAGNOSIS — S61219A Laceration without foreign body of unspecified finger without damage to nail, initial encounter: Secondary | ICD-10-CM

## 2014-11-02 MED ORDER — HYDROCODONE-ACETAMINOPHEN 5-325 MG PO TABS: 1.0000 | ORAL_TABLET | Freq: Four times a day (QID) | ORAL | Status: DC | PRN

## 2014-11-02 MED ORDER — IBUPROFEN 600 MG PO TABS: 600.0000 mg | ORAL_TABLET | Freq: Four times a day (QID) | ORAL | Status: DC | PRN

## 2014-11-02 MED ORDER — TETANUS-DIPHTH-ACELL PERTUSSIS 5-2.5-18.5 LF-MCG/0.5 IM SUSP
0.5000 mL | Freq: Once | INTRAMUSCULAR | Status: AC
  Administered 2014-11-02: 0.5 mL via INTRAMUSCULAR
  Filled 2014-11-02: qty 0.5

## 2014-11-02 MED ORDER — IBUPROFEN 800 MG PO TABS
800.0000 mg | ORAL_TABLET | Freq: Once | ORAL | Status: AC
  Administered 2014-11-02: 800 mg via ORAL
  Filled 2014-11-02: qty 1

## 2014-11-02 MED ORDER — LIDOCAINE HCL 2 % IJ SOLN
10.0000 mL | Freq: Once | INTRAMUSCULAR | Status: AC
  Administered 2014-11-02: 60 mg via INTRADERMAL
  Filled 2014-11-02: qty 20

## 2014-11-02 NOTE — ED Notes (Signed)
Laceration to his left index finger. He was removing an air conditioner and dropped in on his hand.

## 2014-11-02 NOTE — ED Notes (Signed)
Pt ambulating independently w/ steady gait on d/c in no acute distress, A&Ox4. D/c instructions reviewed w/ pt and family - pt and family deny any further questions or concerns at present. Rx given x2  

## 2014-11-02 NOTE — Discharge Instructions (Signed)
Keep the finger clean. Wash with soap and water. Apply topical antibiotics ointment twice a day. Watch for any signs of infection such as swelling, redness, drainage. Please follow-up with a hand specialist as referred.   Deep Skin Avulsion A deep skin avulsion is when all layers of the skin or parts of body structures have been torn away. This is usually a result of severe injury (trauma). A deep skin avulsion can include damage to important structures beneath the skin such as tendons, ligaments, nerves, or blood vessels.  CAUSES  Many injuries can lead to a deep skin avulsion. These include:   Crush injuries.  Bites.  Falls against jagged surfaces.  Gunshot wounds.  Severe burns and injuries involving dragging (such as those from a bicycle or motorcycle accident). TREATMENT   If the wound is small and there is no damage to vital structures like nerves and blood vessels, the damaged tissues may be removed. Then, the wound can be cleaned thoroughly and closed.  A skin graft may be performed. This is a procedure in which the outer layer of skin is removed from a different part of your body. That skin (skin graft) is used to cover the open wound. This can happen after damaged tissue is removed and repairs are completed.  Your caregiver may onlyapply a bandage (dressing) to the wound. The wound will be kept clean and allowed to heal. Healing can take weeks or months and usually leaves a large scar. This type of treatment is only done if your caregiver feels that skin grafting or a similar procedure would not work. You might need a tetanus shot if:  You cannot remember when you had your last tetanus shot.  You have never had a tetanus shot.  The injury broke your skin. If you got a tetanus shot, your arm may swell, get red, and feel warm to the touch. This is common and not a problem. If you need a tetanus shot and you choose not to have one, there is a rare chance of getting tetanus.  Sickness from tetanus can be serious. HOME CARE INSTRUCTIONS   Only take over-the-counter or prescription medicines for pain, discomfort, or fever as directed by your caregiver.  Gently wash the area with mild soap and water 2 times a day, or as directed. Rinse off the soap. Pat the area dry with a clean towel. Do not rub the wound. This may cause bleeding.  Follow your caregiver's instructions for how often you need to change the dressing.  Apply ointment and a dressing to the wound as directed.  If the dressing sticks, moisten it with soapy water and gently remove it.  Change the bandage right away if it becomes wet, dirty, or starts to smell bad.  Take showers. Do not take tub baths, swim, or do anything that may soak the wound until it is healed.  Use anti-itch medicine as directed by your caregiver. The wound may itch when it is healing. Do not pick or scratch at the wound.  Follow up with your caregiver for stitches (sutures), staple, or skin adhesive strip removal. SEEK MEDICAL CARE IF:   You have redness, swelling, or increasing pain in your wound.  A red streak or line extends away from the wound.  You have pus coming from the wound.  You notice a bad smell coming from thewound or dressing.  The wound breaks open (edges not staying together) after sutures have been removed.  You notice something coming out  of the wound, such as a small piece of wood, glass, or metal.  You are unable to properly move a finger or toe if the wound is on your hand or foot.  You have severe swelling around the wound that causes pain and numbness.  Your arm, hand, leg, or foot changes color. SEEK IMMEDIATE MEDICAL CARE IF:   Your pain becomes severe or is not adequately relieved with pain medicine.  You have a fever.  You have nausea and vomiting for more than 24 hours.  You feel lightheaded, weak, or faint.  You develop chest pain or difficulty breathing. MAKE SURE YOU:    Understand these instructions.  Will watch your condition.  Will get help right away if you are not doing well or get worse. Document Released: 05/16/2006 Document Revised: 06/12/2011 Document Reviewed: 07/24/2010 [facility] Patient Information 2015 [place], [place]. This information is not intended to replace advice given to you by your health care provider. Make sure you discuss any questions you have with your health care provider.

## 2014-11-02 NOTE — ED Notes (Signed)
Pt verbally upset at length of wait, he and family member walked out to lobby and were leaving, RN stopped them, brought them back to room and reassessed injury. Pt upset because he continues to be in pain and he stated that no one explained his poc. I obtained ibuprofen for patient, digit had been numbed prior to event. Tech irrigated wound. I sat down with patient and family member and explained crushing injuries and wound care. PA updated and family offered food/liquids and refused.

## 2014-11-03 NOTE — ED Provider Notes (Signed)
CSN: [id number]     Arrival date & time 11/02/14  1911 History   First MD Initiated Contact with Patient 11/02/14 2042     Chief Complaint  Patient presents with  . Laceration     (Consider location/radiation/quality/duration/timing/severity/associated sxs/prior Treatment) HPI Layth Canal is a 49 y.o. male with multiple medical problems, presents to emergency department complaining of injury to the left index finger. Patient states he was removing an air conditioner unit and dropped it on his hand, states he cut through the finger prior to falling to the ground. Patient reports pain in large lacerations of the index finger. Pressure applied at home prior to coming in. Patient denies any other injuries. Denies numbness or weakness to the distal finger.  Past Medical History  Diagnosis Date  . Cardiac arrest - ventricular fibrillation 02/11/2009    SUCCESSFUL PLACEMENT OF A [facility] [place] FORTIFY SINGLE LEAD AUTOMATIC IMPLANTABLE CARDIOVERTER-DEFIBRILLATOR  . Coronary artery disease     s/p CABG  . Myocardial infarct 02/2009    NON-ST-SEGMENT ELEVATION MI  . Hypertension   . Hyperlipidemia   . Ischemic cardiomyopathy 02/09/2009    EF 40-45%  . Anoxic encephalopathy   . Marijuana abuse   . Peripheral vascular disease, unspecified   . Blood in stool   . CHF (congestive heart failure)     CHRONIC SYSTOLIC ; EF 33-29%   Past Surgical History  Procedure Laterality Date  . Circumcision    . Coronary artery bypass graft  06/17/01    X 4  . Insert / replace / remove pacemaker      IMPLANTATION AICD-[employer] [place] FORTIFY  . Cardiac surgery     Family History  Problem Relation Age of Onset  . Hypertension Mother   . Heart attack Father     DIED AT 17 FROM MI  . Heart disease Sister     CAD AND PREVIOUS CABG  . Hypertension      IN MOST OF HIS SIBLINGS   History  Substance Use Topics  . Smoking status: Former Smoker -- 2.00 packs/day for 20 years    Quit date: 04/04/2003  .  Smokeless tobacco: Never Used  . Alcohol Use: No    Review of Systems  Skin: Positive for wound.  Neurological: Negative for weakness.  All other systems reviewed and are negative.     Allergies  Review of patient's allergies indicates no known allergies.  Home Medications   Prior to Admission medications   Medication Sig Start Date End Date Taking? Authorizing Provider  albuterol (PROVENTIL HFA;VENTOLIN HFA) 108 (90 BASE) MCG/ACT inhaler Inhale 1-2 puffs into the lungs every 6 (six) hours as needed for wheezing or shortness of breath. 07/22/14   Ernestina Patches, MD  aspirin 81 MG EC tablet Take 81 mg by mouth every morning.     Historical Provider, MD  atorvastatin (LIPITOR) 80 MG tablet Take 1 tablet (80 mg total) by mouth daily at 6 PM. 10/01/14   Deboraha Sprang, MD  carvedilol (COREG) 25 MG tablet Take 1 tablet (25 mg total) by mouth 2 (two) times daily with a meal. 10/01/14   Deboraha Sprang, MD  esomeprazole (NEXIUM) 40 MG capsule Take 1 capsule (40 mg total) by mouth daily. 10/01/14   Deboraha Sprang, MD  furosemide (LASIX) 20 MG tablet Take 1 tablet (20 mg total) by mouth daily. 10/01/14   Deboraha Sprang, MD  HYDROcodone-acetaminophen (NORCO) 5-325 MG per tablet Take 1 tablet by mouth every  6 (six) hours as needed for moderate pain. 11/02/14   Samael Blades, PA-C  ibuprofen (ADVIL,MOTRIN) 600 MG tablet Take 1 tablet (600 mg total) by mouth every 6 (six) hours as needed. 11/02/14   Rion Schnitzer, PA-C  ibuprofen (ADVIL,MOTRIN) 800 MG tablet Take 1 tablet (800 mg total) by mouth every 8 (eight) hours as needed. 12/10/13   Dorena Dew, FNP  lisinopril (PRINIVIL,ZESTRIL) 20 MG tablet Take 1 tablet (20 mg total) by mouth every morning. 10/01/14   Deboraha Sprang, MD  Multiple Vitamin (MULTIVITAMIN) tablet Take 1 tablet by mouth every morning.     Historical Provider, MD  potassium chloride SA (K-DUR,KLOR-CON) 20 MEQ tablet Take 1 tablet (20 mEq total) by mouth daily. 10/01/14   Deboraha Sprang, MD   BP 119/76 mmHg  Pulse 64  Temp(Src) 98.5 F (36.9 C) (Oral)  Resp 18  Ht 5\' 11"  (1.803 m)  Wt 234 lb (106.142 kg)  BMI 32.65 kg/m2  SpO2 98% Physical Exam  Constitutional: He appears well-developed and well-nourished. No distress.  Neck: Neck supple.  Musculoskeletal:       Hands: Large flap skin avulsion to the left proximal palmar surface of index finger. Skin attached just at one end of laceration. Tissue swelling noted. Hemostatic. Distal finger appears normal, with cap refill <2 sec, sensation and ROM intact at DIP and PIP joints.   Skin: Skin is warm and dry.  Nursing note and vitals reviewed.   ED Course  Procedures (including critical care time) Labs Review Labs Reviewed - No data to display  Imaging Review Dg Hand Complete Left  11/02/2014   CLINICAL DATA:  Dropped an air conditioner on hand tonight.  EXAM: LEFT HAND - COMPLETE 3+ VIEW  COMPARISON:  None.  FINDINGS: The joint spaces are maintained. No acute fracture is identified. There is a soft tissue injury involving the index finger. No radiopaque foreign body.  IMPRESSION: No acute fracture or radiopaque foreign body.   Electronically Signed   By: Marijo Sanes M.D.   On: 11/02/2014 19:41     EKG Interpretation None     LACERATION REPAIR Performed by: Jeannett Senior A Authorized by: Jeannett Senior A Consent: Verbal consent obtained. Risks and benefits: risks, benefits and alternatives were discussed Consent given by: patient Patient identity confirmed: provided demographic data Prepped and Draped in normal sterile fashion Wound explored  Laceration Location: left index finger  Laceration Length: 4cm  No Foreign Bodies seen or palpated  Anesthesia: local infiltration  Local anesthetic: lidocaine 2% wo epinephrine  Anesthetic total: 5 ml  Irrigation method: syringe Amount of cleaning: standard  Skin closure: prolene 4.0  Number of sutures: 8  Technique: simple  interrupted  Patient tolerance: Patient tolerated the procedure well with no immediate complications.  MDM   Final diagnoses:  Finger laceration, initial encounter    Patient with large irregular flap/avulsion injury to the proximal left index finger over the palmar surface. Wound was numbed, and explored. I thoroughly irrigated. Discussed with Dr. Canary Brim about approach of repairing this wound. Due to swelling and soft tissue injury under the skin flap, unable to approximate all the edges. It is also most likely that this skin flap will fall due to lack of good circulation. I was able to attach the flap in most parts of the wound. Instructed to keep wound very clean, topical antibiotics, keep eye on infection. Explained it is unclear if this skin flap will reattach or fall of. Due to  amount of soft tissue injury under the flap, will refer to hand specialist, although I do not think there is any injury to a tendon or a nerve based on todays exam.   Filed Vitals:   11/02/14 1923 11/02/14 2241  BP: 109/81 119/76  Pulse: 78 64  Temp: 98.5 F (36.9 C)   TempSrc: Oral   Resp: 18 18  Height: 5\' 11"  (1.803 m)   Weight: 234 lb (106.142 kg)   SpO2: 98% 98%     Jeannett Senior, PA-C 11/03/14 0054  Jeannett Senior, PA-C 11/03/14 0055  Alfonzo Beers, MD 11/03/14 [phone]

## 2014-11-27 DIAGNOSIS — S61211A Laceration without foreign body of left index finger without damage to nail, initial encounter: Secondary | ICD-10-CM | POA: Diagnosis not present

## 2014-12-28 ENCOUNTER — Encounter: Payer: Self-pay | Admitting: Internal Medicine

## 2014-12-28 ENCOUNTER — Ambulatory Visit (INDEPENDENT_AMBULATORY_CARE_PROVIDER_SITE_OTHER): Payer: Medicare Other | Admitting: *Deleted

## 2014-12-28 DIAGNOSIS — I469 Cardiac arrest, cause unspecified: Secondary | ICD-10-CM | POA: Diagnosis not present

## 2014-12-29 NOTE — Progress Notes (Signed)
Remote ICD transmission.   

## 2015-01-01 LAB — CUP PACEART REMOTE DEVICE CHECK
Battery Voltage: 2.96 V
Brady Statistic RV Percent Paced: 1 %
Date Time Interrogation Session: 20160926071713
HighPow Impedance: 46 Ohm
Lead Channel Impedance Value: 450 Ohm
Lead Channel Pacing Threshold Amplitude: 0.75 V
Lead Channel Pacing Threshold Pulse Width: 0.5 ms
Lead Channel Sensing Intrinsic Amplitude: 11.7 mV
Lead Channel Setting Pacing Amplitude: 2.5 V
Lead Channel Setting Pacing Pulse Width: 0.5 ms
MDC IDC MSMT BATTERY REMAINING LONGEVITY: 64 mo
MDC IDC MSMT BATTERY REMAINING PERCENTAGE: 55 %
MDC IDC SET LEADCHNL RV SENSING SENSITIVITY: 0.5 mV
MDC IDC SET ZONE DETECTION INTERVAL: 250 ms
MDC IDC SET ZONE DETECTION INTERVAL: 300 ms
Pulse Gen Serial Number: 743367

## 2015-01-05 ENCOUNTER — Telehealth: Payer: Self-pay | Admitting: *Deleted

## 2015-01-05 DIAGNOSIS — I472 Ventricular tachycardia, unspecified: Secondary | ICD-10-CM

## 2015-01-05 NOTE — Telephone Encounter (Signed)
LMTCB/sss  Patient needs BMP/MAG and f/u w/SK first available.

## 2015-01-05 NOTE — Telephone Encounter (Signed)
Spoke to wife. Patient hasn't returned home. Wife gave me pt's cell #.  LMTCB on pt's cell phone/sss

## 2015-01-05 NOTE — Telephone Encounter (Signed)
Spoke to patient about ICD shock from 01/04/15. Patient states that he was outside running with grandson when he was shocked. Patient states that he was not dizzy prior to shock and was not syncopal. Patient states that he did experience some aches in his chest last night, but has felt fine today. Patient takes his medications as prescribed.   Plan to discuss episode with Dr.Klein and notify patient if any changes are recommended. Patient voiced understanding.  Patient informed about shock plan.

## 2015-01-05 NOTE — Telephone Encounter (Signed)
Spoke w/wife---LMTCB//sss  ICD shock on 01/04/2015 @1348 .

## 2015-01-06 NOTE — Telephone Encounter (Signed)
Informed patient that Dr.Klein recommends that he have a BMP/Mag level drawn either today or tomorrow. I also informed pt that he needs to f/u with Dr.Klein in the office. Patient voiced understanding.  BMP/Mag ordered.  Will defer scheduling of appt to [facility].

## 2015-01-07 ENCOUNTER — Other Ambulatory Visit (INDEPENDENT_AMBULATORY_CARE_PROVIDER_SITE_OTHER): Payer: Medicare Other

## 2015-01-07 DIAGNOSIS — I10 Essential (primary) hypertension: Secondary | ICD-10-CM

## 2015-01-07 LAB — MAGNESIUM: Magnesium: 1.9 mg/dL (ref 1.5–2.5)

## 2015-01-07 LAB — BASIC METABOLIC PANEL
BUN: 12 mg/dL (ref 7–25)
CHLORIDE: 105 mmol/L (ref 98–110)
CO2: 28 mmol/L (ref 20–31)
CREATININE: 1.09 mg/dL (ref 0.60–1.35)
Calcium: 9.5 mg/dL (ref 8.6–10.3)
Glucose, Bld: 93 mg/dL (ref 65–99)
Potassium: 4.4 mmol/L (ref 3.5–5.3)
Sodium: 137 mmol/L (ref 135–146)

## 2015-01-12 ENCOUNTER — Encounter: Payer: Self-pay | Admitting: Cardiology

## 2015-01-17 ENCOUNTER — Encounter: Payer: Self-pay | Admitting: Nurse Practitioner

## 2015-01-17 NOTE — Progress Notes (Signed)
Electrophysiology Office Note Date: 01/18/2015  ID:  Billy [name], DOB 1966-01-28, MRN [id number]  PCP: Default, Provider, MD Primary Cardiologist: Stanford Breed Electrophysiologist: Caryl Comes  CC: Follow up for ICD shock  Billy [name] is a 49 y.o. male seen today for Dr Caryl Comes. He underwent secondary prevention ICD implantation in 2011 and has done well since.  On 01/04/15, he was outside running with his grandson when he received an ICD shock.  He had a very brief period of increased shortness of breath prior to ICD shock. He did not have dizziness, syncope, chest pain.  ICD interrogation demonstrated long-short initiation of VF at a cycle length of 265msec terminated with HV shock.  He presents today for further evaluation.  Since last being seen in our clinic, the patient reports doing very well.  He is very active without functional limitations.  He denies chest pain, palpitations, dyspnea, PND, orthopnea, nausea, vomiting, dizziness, syncope, edema, weight gain, or early satiety. He does have occasional right calf pain with ambulation that resolves with rest.   Echo 02/2009 demonstrated EF 45%, akinesis of basal-mid inferoposterior myocardium Myoview 04/2012 demonstrated EF 38%, inferolatera hypokinesis, prior infarct with peri-infarct ischemia Cath 2009 2/4 patent grafts, successful DES to circ  Device History: STJ single chamber ICD implanted 2011 for secondary prevention History of appropriate therapy: yes - 01/2015 History of AAD therapy: no   Past Medical History  Diagnosis Date  . Cardiac arrest - ventricular fibrillation 02/11/2009    a. s/p ICD  . Coronary artery disease     s/p CABG  . Myocardial infarct ([place])     NON-ST-SEGMENT ELEVATION MI  . Hypertension   . Hyperlipidemia   . Ischemic cardiomyopathy   . Marijuana abuse   . Peripheral vascular disease, unspecified ([place])   . Blood in stool   . CHF (congestive heart failure) [facility])    Past Surgical History    Procedure Laterality Date  . Circumcision    . Coronary artery bypass graft  06/17/01    X 4  . Cardiac defibrillator placement      STJ single chamber ICD implanted for secondary prevention    Current Outpatient Prescriptions  Medication Sig Dispense Refill  . albuterol (PROVENTIL HFA;VENTOLIN HFA) 108 (90 BASE) MCG/ACT inhaler Inhale 1-2 puffs into the lungs every 6 (six) hours as needed for wheezing or shortness of breath. 1 Inhaler 0  . aspirin 81 MG EC tablet Take 81 mg by mouth every morning.     [name] atorvastatin (LIPITOR) 80 MG tablet Take 1 tablet (80 mg total) by mouth daily at 6 PM. 90 tablet 1  . carvedilol (COREG) 25 MG tablet Take 1 tablet (25 mg total) by mouth 2 (two) times daily with a meal. 180 tablet 2  . esomeprazole (NEXIUM) 40 MG capsule Take 1 capsule (40 mg total) by mouth daily. 90 capsule 2  . furosemide (LASIX) 20 MG tablet Take 1 tablet (20 mg total) by mouth daily. 90 tablet 2  . HYDROcodone-acetaminophen (NORCO) 5-325 MG per tablet Take 1 tablet by mouth every 6 (six) hours as needed for moderate pain. 20 tablet 0  . ibuprofen (ADVIL,MOTRIN) 800 MG tablet Take 1 tablet (800 mg total) by mouth every 8 (eight) hours as needed. 30 tablet 0  . lisinopril (PRINIVIL,ZESTRIL) 20 MG tablet Take 1 tablet (20 mg total) by mouth every morning. 90 tablet 2  . Multiple Vitamin (MULTIVITAMIN) tablet Take 1 tablet by mouth every morning.     . potassium  chloride SA (K-DUR,KLOR-CON) 20 MEQ tablet Take 1 tablet (20 mEq total) by mouth daily. 90 tablet 2   No current facility-administered medications for this visit.    Allergies:   Review of patient's allergies indicates no known allergies.   Social History: Social History   Social History  . Marital Status: Married    Spouse Name: N/A  . Number of Children: N/A  . Years of Education: N/A   Occupational History  . Not on file.   Social History Main Topics  . Smoking status: Former Smoker -- 2.00 packs/day for 20  years    Quit date: 04/04/2003  . Smokeless tobacco: Never Used  . Alcohol Use: No  . Drug Use: No  . Sexual Activity: Not on file   Other Topics Concern  . Not on file   Social History Narrative   MARRIED   FORMER TOBACCO USE, SMOKED FOR 20 YRS 2 PPD; QUIT IN 2005   NO ETOH   NO ILLICIT DRUG USE   NO REGULAR EXERCISE         ICD-[employer] ; CERTIFIED LETTER SENT AND RETURNED BAD ADDRESS, PLS UPDATE DJW.    Family History: Family History  Problem Relation Age of Onset  . Hypertension Mother   . Heart attack Father     DIED AT 20 FROM MI  . Heart disease Sister     CAD AND PREVIOUS CABG  . Hypertension      IN MOST OF HIS SIBLINGS    Review of Systems: All other systems reviewed and are otherwise negative except as noted above.   Physical Exam: VS:  BP 130/88 mmHg  Pulse 56  Ht 5\' 11"  (1.803 m)  Wt 230 lb 12.8 oz (104.69 kg)  BMI 32.20 kg/m2 , BMI Body mass index is 32.2 kg/(m^2).  GEN- The patient is well appearing, alert and oriented x 3 today.   HEENT: normocephalic, atraumatic; sclera clear, conjunctiva pink; hearing intact; oropharynx clear; neck supple Lungs- Clear to ausculation bilaterally, normal work of breathing.  No wheezes, rales, rhonchi Heart- Regular rate and rhythm, no murmurs, rubs or gallops GI- soft, non-tender, non-distended, bowel sounds present Extremities- no clubbing, cyanosis, or edema; DP/PT/radial pulses 2+ bilaterally MS- no significant deformity or atrophy Skin- warm and dry, no rash or lesion; ICD pocket well healed Psych- euthymic mood, full affect Neuro- strength and sensation are intact  ICD interrogation- reviewed in detail today,  See PACEART report  EKG:  EKG is ordered today. The ekg ordered today shows sinus brady, rate 56  Recent Labs: 07/22/2014: Hemoglobin 14.2; Platelets 489* 01/07/2015: BUN 12; Creat 1.09; Magnesium 1.9; Potassium 4.4; Sodium 137   Wt Readings from Last 3 Encounters:  01/18/15 230 lb 12.8 oz  (104.69 kg)  11/02/14 234 lb (106.142 kg)  09/25/14 234 lb (106.142 kg)     Other studies Reviewed: Additional studies/ records that were reviewed today include: Dr Caryl Comes and Crenshaw's office notes, last echo, myoview, cath  Assessment and Plan:  1.  VF S/p appropriate ICD therapy BMET, Mg normal The patient has had no ischemic symptoms and remains very active Will update echo at this time.  Advised if he has recurrent ventricular arrhythmias, will need to consider repeat cath/AAD therapy No driving x6 months [place] remote monitoring  2.  ICM/CAD Continue medical therapy No ischemic symptoms  3.  Chronic systolic dysfunction euvolemic today Stable on an appropriate medical regimen Normal ICD function See [facility] report No changes today  Current medicines are reviewed at length with the patient today.   The patient does not have concerns regarding his medicines.  The following changes were made today:  none  Labs/ tests ordered today include: echo    Disposition:   Follow up with Dr Caryl Comes in 3 months   Signed, Chanetta Marshall, NP 01/18/2015 8:35 AM  [address] [place] [address] [phone] (office) [phone] (fax)

## 2015-01-18 ENCOUNTER — Ambulatory Visit (INDEPENDENT_AMBULATORY_CARE_PROVIDER_SITE_OTHER): Payer: Medicare Other | Admitting: Nurse Practitioner

## 2015-01-18 ENCOUNTER — Encounter: Payer: Self-pay | Admitting: Nurse Practitioner

## 2015-01-18 ENCOUNTER — Encounter: Payer: Self-pay | Admitting: Internal Medicine

## 2015-01-18 VITALS — BP 130/88 | HR 56 | Ht 71.0 in | Wt 230.8 lb

## 2015-01-18 DIAGNOSIS — I255 Ischemic cardiomyopathy: Secondary | ICD-10-CM | POA: Diagnosis not present

## 2015-01-18 DIAGNOSIS — I10 Essential (primary) hypertension: Secondary | ICD-10-CM

## 2015-01-18 DIAGNOSIS — I5022 Chronic systolic (congestive) heart failure: Secondary | ICD-10-CM | POA: Diagnosis not present

## 2015-01-18 DIAGNOSIS — R079 Chest pain, unspecified: Secondary | ICD-10-CM | POA: Diagnosis not present

## 2015-01-18 DIAGNOSIS — I4901 Ventricular fibrillation: Secondary | ICD-10-CM

## 2015-01-18 LAB — CUP PACEART INCLINIC DEVICE CHECK
Implantable Lead Implant Date: 20101111
Implantable Lead Location: 753860
Implantable Lead Model: 7121
Lead Channel Setting Pacing Amplitude: 2.5 V
Lead Channel Setting Pacing Pulse Width: 0.5 ms
MDC IDC SESS DTM: 20161017084508
MDC IDC SET LEADCHNL RV SENSING SENSITIVITY: 0.5 mV
Pulse Gen Serial Number: 743367
Zone Setting Detection Interval: 250 ms
Zone Setting Detection Interval: 300 ms
Zone Setting Vendor Type Category: 773185
Zone Setting Vendor Type Category: 773188

## 2015-01-18 NOTE — Patient Instructions (Signed)
Medication Instructions: Your physician recommends that you continue on your current medications as directed. Please refer to the Current Medication list given to you today.    Labwork: NONE ORDER TODAY    Testing/Procedures:   Your physician has requested that you have an echocardiogram. IN 2 MONTHS Echocardiography is a painless test that uses sound waves to create images of your heart. It provides your doctor with information about the size and shape of your heart and how well your heart's chambers and valves are working. This procedure takes approximately one hour. There are no restrictions for this procedure.    Follow-Up: IN 3 MONTHS WITH DR Caryl Comes    Any Other Special Instructions Will Be Listed Below (If Applicable).

## 2015-03-15 ENCOUNTER — Other Ambulatory Visit (HOSPITAL_COMMUNITY): Payer: Medicare Other

## 2015-03-26 ENCOUNTER — Other Ambulatory Visit (HOSPITAL_COMMUNITY): Payer: Medicare Other

## 2015-03-30 ENCOUNTER — Telehealth: Payer: Self-pay | Admitting: Cardiology

## 2015-03-30 ENCOUNTER — Ambulatory Visit (INDEPENDENT_AMBULATORY_CARE_PROVIDER_SITE_OTHER): Payer: Medicare Other | Admitting: *Deleted

## 2015-03-30 DIAGNOSIS — I4901 Ventricular fibrillation: Secondary | ICD-10-CM

## 2015-03-30 NOTE — Telephone Encounter (Signed)
Spoke with pt and reminded pt of remote transmission that is due today. Pt verbalized understanding.   

## 2015-03-31 NOTE — Progress Notes (Signed)
Remote ICD transmission.   

## 2015-04-07 ENCOUNTER — Ambulatory Visit (HOSPITAL_COMMUNITY): Payer: Medicare Other

## 2015-04-14 ENCOUNTER — Ambulatory Visit (HOSPITAL_COMMUNITY): Payer: Medicare Other

## 2015-04-16 LAB — CUP PACEART REMOTE DEVICE CHECK
Battery Remaining Percentage: 52 %
Battery Voltage: 2.95 V
Date Time Interrogation Session: 20161227234714
HighPow Impedance: 52 Ohm
Lead Channel Impedance Value: 480 Ohm
MDC IDC LEAD IMPLANT DT: 20101111
MDC IDC LEAD LOCATION: 753860
MDC IDC LEAD MODEL: 7121
MDC IDC MSMT BATTERY REMAINING LONGEVITY: 61 mo
MDC IDC MSMT LEADCHNL RV SENSING INTR AMPL: 11.7 mV
MDC IDC PG SERIAL: 743367
MDC IDC SET LEADCHNL RV PACING AMPLITUDE: 2.5 V
MDC IDC SET LEADCHNL RV PACING PULSEWIDTH: 0.5 ms
MDC IDC SET LEADCHNL RV SENSING SENSITIVITY: 0.5 mV
MDC IDC STAT BRADY RV PERCENT PACED: 1 %

## 2015-04-21 ENCOUNTER — Encounter: Payer: Self-pay | Admitting: Cardiology

## 2015-04-30 ENCOUNTER — Encounter: Payer: Medicare Other | Admitting: Internal Medicine

## 2015-05-03 ENCOUNTER — Encounter: Payer: Self-pay | Admitting: Internal Medicine

## 2015-06-22 ENCOUNTER — Other Ambulatory Visit: Payer: Self-pay | Admitting: Internal Medicine

## 2015-06-23 ENCOUNTER — Encounter: Payer: Self-pay | Admitting: Internal Medicine

## 2015-06-23 ENCOUNTER — Ambulatory Visit: Payer: Medicare Other | Admitting: Internal Medicine

## 2015-06-23 NOTE — Telephone Encounter (Signed)
Ok to refill 

## 2015-06-24 NOTE — Telephone Encounter (Signed)
OK to refill

## 2015-07-29 ENCOUNTER — Other Ambulatory Visit: Payer: Self-pay | Admitting: Internal Medicine

## 2015-08-30 ENCOUNTER — Other Ambulatory Visit: Payer: Self-pay | Admitting: Internal Medicine

## 2015-10-20 ENCOUNTER — Ambulatory Visit: Payer: Self-pay

## 2015-10-20 ENCOUNTER — Emergency Department (HOSPITAL_COMMUNITY)
Admission: EM | Admit: 2015-10-20 | Discharge: 2015-10-20 | Disposition: A | Discharge status: Home / Self Care | Payer: Medicare Other | Attending: Emergency Medicine | Admitting: Emergency Medicine

## 2015-10-20 ENCOUNTER — Encounter (HOSPITAL_COMMUNITY): Payer: Self-pay

## 2015-10-20 DIAGNOSIS — I110 Hypertensive heart disease with heart failure: Secondary | ICD-10-CM | POA: Insufficient documentation

## 2015-10-20 DIAGNOSIS — R1033 Periumbilical pain: Secondary | ICD-10-CM | POA: Diagnosis not present

## 2015-10-20 DIAGNOSIS — Z951 Presence of aortocoronary bypass graft: Secondary | ICD-10-CM | POA: Insufficient documentation

## 2015-10-20 DIAGNOSIS — I509 Heart failure, unspecified: Secondary | ICD-10-CM | POA: Insufficient documentation

## 2015-10-20 DIAGNOSIS — Z7982 Long term (current) use of aspirin: Secondary | ICD-10-CM | POA: Insufficient documentation

## 2015-10-20 DIAGNOSIS — I2510 Atherosclerotic heart disease of native coronary artery without angina pectoris: Secondary | ICD-10-CM | POA: Diagnosis not present

## 2015-10-20 DIAGNOSIS — I252 Old myocardial infarction: Secondary | ICD-10-CM | POA: Diagnosis not present

## 2015-10-20 DIAGNOSIS — Z87891 Personal history of nicotine dependence: Secondary | ICD-10-CM | POA: Diagnosis not present

## 2015-10-20 DIAGNOSIS — B349 Viral infection, unspecified: Secondary | ICD-10-CM | POA: Diagnosis not present

## 2015-10-20 DIAGNOSIS — R109 Unspecified abdominal pain: Secondary | ICD-10-CM

## 2015-10-20 DIAGNOSIS — R112 Nausea with vomiting, unspecified: Secondary | ICD-10-CM | POA: Diagnosis present

## 2015-10-20 LAB — COMPREHENSIVE METABOLIC PANEL
ALBUMIN: 3.1 g/dL — AB (ref 3.5–5.0)
ALT: 35 U/L (ref 17–63)
AST: 30 U/L (ref 15–41)
Alkaline Phosphatase: 76 U/L (ref 38–126)
Anion gap: 6 (ref 5–15)
BILIRUBIN TOTAL: 1 mg/dL (ref 0.3–1.2)
BUN: 7 mg/dL (ref 6–20)
CALCIUM: 8.9 mg/dL (ref 8.9–10.3)
CO2: 25 mmol/L (ref 22–32)
Chloride: 103 mmol/L (ref 101–111)
Creatinine, Ser: 1.17 mg/dL (ref 0.61–1.24)
GFR calc Af Amer: >60 mL/min (ref >60)
GFR calc non Af Amer: >60 mL/min (ref >60)
GLUCOSE: 102 mg/dL — AB (ref 65–99)
Potassium: 4.2 mmol/L (ref 3.5–5.1)
SODIUM: 134 mmol/L — AB (ref 135–145)
Total Protein: 6.6 g/dL (ref 6.5–8.1)

## 2015-10-20 LAB — CBC WITH DIFFERENTIAL/PLATELET
BASOS PCT: 0 %
Basophils Absolute: 0 10*3/uL (ref 0.0–0.1)
EOS ABS: 0.2 10*3/uL (ref 0.0–0.7)
Eosinophils Relative: 1 %
HEMATOCRIT: 42.2 % (ref 39.0–52.0)
Hemoglobin: 15.2 g/dL (ref 13.0–17.0)
Lymphocytes Relative: 10 %
Lymphs Abs: 1.7 10*3/uL (ref 0.7–4.0)
MCH: 29.4 pg (ref 26.0–34.0)
MCHC: 36 g/dL (ref 30.0–36.0)
MCV: 81.6 fL (ref 78.0–100.0)
MONO ABS: 2.2 10*3/uL — AB (ref 0.1–1.0)
Monocytes Relative: 13 %
NEUTROS ABS: 12.8 10*3/uL — AB (ref 1.7–7.7)
Neutrophils Relative %: 76 %
Platelets: 199 10*3/uL (ref 150–400)
RBC: 5.17 MIL/uL (ref 4.22–5.81)
RDW: 14.4 % (ref 11.5–15.5)
WBC: 16.9 10*3/uL — ABNORMAL HIGH (ref 4.0–10.5)

## 2015-10-20 LAB — URINALYSIS, ROUTINE W REFLEX MICROSCOPIC
Bilirubin Urine: NEGATIVE
Glucose, UA: NEGATIVE mg/dL
Ketones, ur: NEGATIVE mg/dL
Nitrite: NEGATIVE
Protein, ur: 30 mg/dL — AB
SPECIFIC GRAVITY, URINE: 1.011 (ref 1.005–1.030)
pH: 8.5 — ABNORMAL HIGH (ref 5.0–8.0)

## 2015-10-20 LAB — RAPID URINE DRUG SCREEN, HOSP PERFORMED
Amphetamines: NOT DETECTED
BARBITURATES: NOT DETECTED
Benzodiazepines: NOT DETECTED
COCAINE: NOT DETECTED
Opiates: NOT DETECTED
TETRAHYDROCANNABINOL: POSITIVE — AB

## 2015-10-20 LAB — URINE MICROSCOPIC-ADD ON

## 2015-10-20 LAB — LIPASE, BLOOD: Lipase: 15 U/L (ref 11–51)

## 2015-10-20 LAB — I-STAT CG4 LACTIC ACID, ED: Lactic Acid, Venous: 0.84 mmol/L (ref 0.5–1.9)

## 2015-10-20 MED ORDER — ONDANSETRON HCL 4 MG PO TABS: 4.0000 mg | ORAL_TABLET | Freq: Four times a day (QID) | ORAL | Status: DC

## 2015-10-20 MED ORDER — ONDANSETRON HCL 4 MG/2ML IJ SOLN
4.0000 mg | Freq: Once | INTRAMUSCULAR | Status: AC
  Administered 2015-10-20: 4 mg via INTRAVENOUS
  Filled 2015-10-20: qty 2

## 2015-10-20 MED ORDER — DICYCLOMINE HCL 20 MG PO TABS: 20.0000 mg | ORAL_TABLET | Freq: Two times a day (BID) | ORAL | Status: DC

## 2015-10-20 MED ORDER — DICYCLOMINE HCL 10 MG/ML IM SOLN
20.0000 mg | Freq: Once | INTRAMUSCULAR | Status: AC
  Administered 2015-10-20: 20 mg via INTRAMUSCULAR
  Filled 2015-10-20: qty 2

## 2015-10-20 MED ORDER — ACETAMINOPHEN 325 MG PO TABS
650.0000 mg | ORAL_TABLET | Freq: Once | ORAL | Status: AC
  Administered 2015-10-20: 650 mg via ORAL
  Filled 2015-10-20: qty 2

## 2015-10-20 MED ORDER — KETOROLAC TROMETHAMINE 30 MG/ML IJ SOLN
30.0000 mg | Freq: Once | INTRAMUSCULAR | Status: AC
  Administered 2015-10-20: 30 mg via INTRAVENOUS
  Filled 2015-10-20: qty 1

## 2015-10-20 MED ORDER — SODIUM CHLORIDE 0.9 % IV BOLUS (SEPSIS)
500.0000 mL | Freq: Once | INTRAVENOUS | Status: AC
  Administered 2015-10-20: 500 mL via INTRAVENOUS

## 2015-10-20 NOTE — Discharge Instructions (Signed)
°  There does not appear to be an emergent cause for your symptoms at this time. Your exam, labs were reassuring. It is important to stay well hydrated and drink lots of water or Gatorade as we discussed. He may take your medications as prescribed to help with nausea and abdominal cramping. Return to ED for any new or worsening symptoms as we discussed.   Abdominal Pain, Adult Many things can cause belly (abdominal) pain. Most times, the belly pain is not dangerous. Many cases of belly pain can be watched and treated at home. HOME CARE   Do not take medicines that help you go poop (laxatives) unless told to by your doctor.  Only take medicine as told by your doctor.  Eat or drink as told by your doctor. Your doctor will tell you if you should be on a special diet. GET HELP IF:  You do not know what is causing your belly pain.  You have belly pain while you are sick to your stomach (nauseous) or have runny poop (diarrhea).  You have pain while you pee or poop.  Your belly pain wakes you up at night.  You have belly pain that gets worse or better when you eat.  You have belly pain that gets worse when you eat fatty foods.  You have a fever. GET HELP RIGHT AWAY IF:   The pain does not go away within 2 hours.  You keep throwing up (vomiting).  The pain changes and is only in the right or left part of the belly.  You have bloody or tarry looking poop. MAKE SURE YOU:   Understand these instructions.  Will watch your condition.  Will get help right away if you are not doing well or get worse.   This information is not intended to replace advice given to you by your health care provider. Make sure you discuss any questions you have with your health care provider.   Document Released: 09/06/2007 Document Revised: 04/10/2014 Document Reviewed: 11/27/2012 Elsevier Interactive Patient Education [employer].

## 2015-10-20 NOTE — ED Provider Notes (Signed)
CSN: [id number]     Arrival date & time 10/20/15  1009 History   First MD Initiated Contact with Patient 10/20/15 1013     Chief Complaint  Patient presents with  . Emesis  . Fatigue     (Consider location/radiation/quality/duration/timing/severity/associated sxs/prior Treatment) HPI Billy [name] is a 50 y.o. male history of coronary artery disease status post CABG, CHF, vascular disease, here for evaluation of nausea, vomiting, weakness. Patient reports on Saturday he went to cut his grass, had some lower abdominal discomfort with associated nausea and vomiting-nonbloody and nonbilious. He reports associated fever with a MAXIMUM TEMPERATURE 102.9, chills, generalized weakness, headache and diffuse body aches. Reports aching sensation in lower back, bilateral shoulders and neck. Reports some relief with ibuprofen and Tylenol. Last Tylenol, 1000 mg at 8:00 AM. Denies any sick contacts, chest pain, shortness of breath, cough, back pain, numbness or weakness, rash. No recent travel, surgeries, antibiotic use.  Past Medical History  Diagnosis Date  . Cardiac arrest - ventricular fibrillation 02/11/2009    a. s/p ICD  . Coronary artery disease     s/p CABG  . Myocardial infarct ([place])     NON-ST-SEGMENT ELEVATION MI  . Hypertension   . Hyperlipidemia   . Ischemic cardiomyopathy   . Marijuana abuse   . Peripheral vascular disease, unspecified ([place])   . Blood in stool   . CHF (congestive heart failure) [facility])    Past Surgical History  Procedure Laterality Date  . Circumcision    . Coronary artery bypass graft  06/17/01    X 4  . Cardiac defibrillator placement      STJ single chamber ICD implanted for secondary prevention   Family History  Problem Relation Age of Onset  . Hypertension Mother   . Heart attack Father     DIED AT 25 FROM MI  . Heart disease Sister     CAD AND PREVIOUS CABG  . Hypertension      IN MOST OF HIS SIBLINGS   Social History  Substance Use Topics  .  Smoking status: Former Smoker -- 2.00 packs/day for 20 years    Quit date: 04/04/2003  . Smokeless tobacco: Never Used  . Alcohol Use: No    Review of Systems A 10 point review of systems was completed and was negative except for pertinent positives and negatives as mentioned in the history of present illness     Allergies  Review of patient's allergies indicates no known allergies.  Home Medications   Prior to Admission medications   Medication Sig Start Date End Date Taking? Authorizing Provider  acetaminophen (TYLENOL) 500 MG tablet Take 1,000 mg by mouth every 8 (eight) hours as needed (pain).   Yes Historical Provider, MD  albuterol (PROVENTIL HFA;VENTOLIN HFA) 108 (90 BASE) MCG/ACT inhaler Inhale 1-2 puffs into the lungs every 6 (six) hours as needed for wheezing or shortness of breath. 07/22/14  Yes Ernestina Patches, MD  aspirin 81 MG EC tablet Take 81 mg by mouth every morning.    Yes Historical Provider, MD  atorvastatin (LIPITOR) 80 MG tablet Take 1 tablet (80 mg total) by mouth daily at 6 PM. 10/01/14  Yes Deboraha Sprang, MD  carvedilol (COREG) 25 MG tablet take 1 tablet by mouth twice a day 07/30/15  Yes Deboraha Sprang, MD  furosemide (LASIX) 20 MG tablet take 1 tablet by mouth once daily 07/30/15  Yes Deboraha Sprang, MD  HYDROcodone-acetaminophen [facility]) 5-325 MG per tablet Take 1 tablet  by mouth every 6 (six) hours as needed for moderate pain. 11/02/14  Yes Tatyana Kirichenko, PA-C  ibuprofen (ADVIL,MOTRIN) 800 MG tablet Take 1 tablet (800 mg total) by mouth every 8 (eight) hours as needed. 12/10/13  Yes Dorena Dew, FNP  lisinopril (PRINIVIL,ZESTRIL) 20 MG tablet take 1 tablet by mouth every morning 08/31/15  Yes Deboraha Sprang, MD  Multiple Vitamin (MULTIVITAMIN) tablet Take 1 tablet by mouth every morning.    Yes Historical Provider, MD  NEXIUM 40 MG capsule take 1 capsule by mouth once daily 06/24/15  Yes Deboraha Sprang, MD  potassium chloride SA (K-DUR,KLOR-CON) 20 MEQ tablet  Take 1 tablet (20 mEq total) by mouth daily. 10/01/14  Yes Deboraha Sprang, MD  dicyclomine (BENTYL) 20 MG tablet Take 1 tablet (20 mg total) by mouth 2 (two) times daily. 10/20/15   Comer Locket, PA-C  ondansetron (ZOFRAN) 4 MG tablet Take 1 tablet (4 mg total) by mouth every 6 (six) hours. 10/20/15   Stephanine Reas, PA-C   BP 108/79 mmHg  Pulse 83  Temp(Src) 99.1 F (37.3 C) (Oral)  Resp 20  Ht 6' (1.829 m)  Wt 102.059 kg  BMI 30.51 kg/m2  SpO2 95% Physical Exam  Constitutional: He is oriented to person, place, and time. He appears well-developed and well-nourished. No distress.  HENT:  Head: Normocephalic and atraumatic.  Mouth/Throat: Oropharynx is clear and moist.  Eyes: Conjunctivae are normal. Pupils are equal, round, and reactive to light. Right eye exhibits no discharge. Left eye exhibits no discharge. No scleral icterus.  Neck: Neck supple.  No meningismus or nuchal rigidity. Full active range of motion of neck.  Cardiovascular: Normal rate, regular rhythm, normal heart sounds and intact distal pulses.   Pulmonary/Chest: Effort normal and breath sounds normal. No respiratory distress. He has no wheezes. He has no rales.  Abdominal: Soft. There is tenderness.  Very mild tenderness palpation infraumbilical region. No rebound or guarding. No abdominal distention. No peritoneal signs.  Musculoskeletal: Normal range of motion. He exhibits no edema or tenderness.  Neurological: He is alert and oriented to person, place, and time. Coordination normal.  Cranial Nerves II-XII grossly intact  Skin: Skin is warm and dry. No rash noted. He is not diaphoretic.  Psychiatric: He has a normal mood and affect.  Nursing note and vitals reviewed.   ED Course  Procedures (including critical care time) Labs Review Labs Reviewed  COMPREHENSIVE METABOLIC PANEL - Abnormal; Notable for the following:    Sodium 134 (*)    Glucose, Bld 102 (*)    Albumin 3.1 (*)    All other components  within normal limits  CBC WITH DIFFERENTIAL/PLATELET - Abnormal; Notable for the following:    WBC 16.9 (*)    Neutro Abs 12.8 (*)    Monocytes Absolute 2.2 (*)    All other components within normal limits  URINALYSIS, ROUTINE W REFLEX MICROSCOPIC (NOT AT [facility]) - Abnormal; Notable for the following:    pH 8.5 (*)    Hgb urine dipstick SMALL (*)    Protein, ur 30 (*)    Leukocytes, UA TRACE (*)    All other components within normal limits  URINE RAPID DRUG SCREEN, HOSP PERFORMED - Abnormal; Notable for the following:    Tetrahydrocannabinol POSITIVE (*)    All other components within normal limits  URINE MICROSCOPIC-ADD ON - Abnormal; Notable for the following:    Squamous Epithelial / LPF 0-5 (*)    Bacteria, UA FEW (*)  All other components within normal limits  LIPASE, BLOOD  I-STAT CG4 LACTIC ACID, ED    Imaging Review No results found. I have personally reviewed and evaluated these images and lab results as part of my medical decision-making.   EKG Interpretation None     Filed Vitals:   10/20/15 1430 10/20/15 1445 10/20/15 1500 10/20/15 1603  BP: 123/75 127/77 121/77 108/79  Pulse: 74 76 76 83  Temp:      TempSrc:      Resp: 29 24 29 20   Height:      Weight:      SpO2: 98% 98% 99% 95%    MDM  Patient presents for evaluation of fevers, diffuse myalgias, abdominal discomfort with nausea and vomiting. On arrival, he is hemodynamically stable and afebrile. He overall appears well. His physical exam is unremarkable. Symptoms seem consistent with a viral illness. However, we will obtain screening labs, EKG. Given 500 mL fluid bolus, Zofran. Will reassess Fever of 102.3 treated with Tylenol, resolved. Screening labs significant for a leukocytosis of 16.9. Seems that patient routinely has elevated white count. His abdominal exam is not concerning for emergent infection. Lactic acid is normal. His UDS is positive for marijuana, possibly contributes to nausea and vomiting.  He is tolerating oral fluids here, no vomiting. Symptoms have resolved after fluid bolus, Zofran. Given Bentyl for abdominal cramping. Discharge with similar medications. He overall appears for well, nontoxic, hemodynamically stable and appropriate for outpatient follow-up. Strict return precautions discussed. Prior to patient discharge, I discussed and reviewed this case with Dr.Long   Final diagnoses:  Abdominal discomfort  Viral illness        Comer Locket, PA-C 10/20/15 [place], MD 10/20/15 [phone]

## 2015-10-20 NOTE — ED Notes (Addendum)
Pt. sts he went to cut the grass on Saturday and has been feeling weak, with a fever as high as 102.9, chills, night seats, vomiting, headache, with a stiff neck that is minimized with tylenol

## 2015-10-21 LAB — PATHOLOGIST SMEAR REVIEW

## 2015-11-07 ENCOUNTER — Other Ambulatory Visit: Payer: Self-pay | Admitting: Internal Medicine

## 2015-11-08 ENCOUNTER — Telehealth: Payer: Self-pay | Admitting: Cardiology

## 2015-11-08 NOTE — Telephone Encounter (Signed)
LMOVM requesting that pt send manual transmission b/c home monitor has not updated in at least 8 days.   

## 2015-11-08 NOTE — Telephone Encounter (Signed)
Please advise 

## 2015-11-09 ENCOUNTER — Telehealth: Payer: Self-pay | Admitting: Internal Medicine

## 2015-11-09 NOTE — Telephone Encounter (Signed)
Called pt back and let him know that as long as monitor was plugged back in the monitor would transmit. Also spoke with pt about f/u with Dr. Caryl Comes. Informed pt that someone would call him back to schedule an apt. Pt voiced understanding.

## 2015-11-09 NOTE — Telephone Encounter (Signed)
New message       Calling to let device clinic know that his defibulator box was unplugged yesterday.  It is not plugged in

## 2015-11-09 NOTE — Telephone Encounter (Signed)
OK to refill x 1 refill. All further refills through PCP.  Thanks!

## 2015-11-16 ENCOUNTER — Other Ambulatory Visit: Payer: Self-pay | Admitting: Internal Medicine

## 2015-11-18 ENCOUNTER — Other Ambulatory Visit: Payer: Self-pay | Admitting: *Deleted

## 2015-11-18 MED ORDER — ATORVASTATIN CALCIUM 80 MG PO TABS: 80.0000 mg | ORAL_TABLET | Freq: Every day | ORAL | 0 refills | Status: DC

## 2015-11-18 MED ORDER — POTASSIUM CHLORIDE CRYS ER 20 MEQ PO TBCR: 20.0000 meq | EXTENDED_RELEASE_TABLET | Freq: Every day | ORAL | 0 refills | Status: DC

## 2015-11-18 MED ORDER — ESOMEPRAZOLE MAGNESIUM 40 MG PO CPDR: 40.0000 mg | DELAYED_RELEASE_CAPSULE | Freq: Every day | ORAL | 0 refills | Status: DC

## 2015-11-18 NOTE — Telephone Encounter (Signed)
OK to refill x 30 days supply- no refills. He is coming in on 12/01/15.

## 2015-11-18 NOTE — Telephone Encounter (Signed)
Patients wife called and requested the following refills. There is not a recent lipid panel in epic and I do not see where Dr Caryl Comes has ordered these for the patient. It looks like they were refilled by Dr Stanford Breed previously, but he has not been seeing him. Ok to refill under Dr Caryl Comes? Please advise. Thanks, [place]

## 2015-12-01 ENCOUNTER — Encounter: Payer: Self-pay | Admitting: Internal Medicine

## 2015-12-01 ENCOUNTER — Ambulatory Visit (INDEPENDENT_AMBULATORY_CARE_PROVIDER_SITE_OTHER): Payer: Medicare Other | Admitting: Internal Medicine

## 2015-12-01 VITALS — BP 116/68 | HR 73 | Ht 72.0 in | Wt 218.0 lb

## 2015-12-01 DIAGNOSIS — E785 Hyperlipidemia, unspecified: Secondary | ICD-10-CM | POA: Diagnosis not present

## 2015-12-01 DIAGNOSIS — I2510 Atherosclerotic heart disease of native coronary artery without angina pectoris: Secondary | ICD-10-CM

## 2015-12-01 DIAGNOSIS — I255 Ischemic cardiomyopathy: Secondary | ICD-10-CM

## 2015-12-01 DIAGNOSIS — I4901 Ventricular fibrillation: Secondary | ICD-10-CM | POA: Diagnosis not present

## 2015-12-01 MED ORDER — LISINOPRIL 20 MG PO TABS: 20.0000 mg | ORAL_TABLET | Freq: Every morning | ORAL | 3 refills | Status: DC

## 2015-12-01 MED ORDER — CARVEDILOL 25 MG PO TABS: 25.0000 mg | ORAL_TABLET | Freq: Two times a day (BID) | ORAL | 3 refills | Status: DC

## 2015-12-01 MED ORDER — NITROGLYCERIN 0.4 MG SL SUBL: 0.4000 mg | SUBLINGUAL_TABLET | SUBLINGUAL | 3 refills | Status: DC | PRN

## 2015-12-01 MED ORDER — ATORVASTATIN CALCIUM 80 MG PO TABS: 80.0000 mg | ORAL_TABLET | Freq: Every day | ORAL | 3 refills | Status: DC

## 2015-12-01 MED ORDER — POTASSIUM CHLORIDE CRYS ER 20 MEQ PO TBCR: 20.0000 meq | EXTENDED_RELEASE_TABLET | Freq: Every day | ORAL | 3 refills | Status: DC

## 2015-12-01 MED ORDER — ESOMEPRAZOLE MAGNESIUM 40 MG PO CPDR: 40.0000 mg | DELAYED_RELEASE_CAPSULE | Freq: Every day | ORAL | 0 refills | Status: DC

## 2015-12-01 NOTE — Progress Notes (Signed)
Patient Care Team: Provider Default, MD as PCP - General   HPI  Billy [name] is a 50 y.o. male Seen in followup for an ICD implanted following a cardiac arrest fall 2011. His history of ischemic heart disease and prior bypass grafting and had declined ICD implantation prior to his arrest.   10/16 he had intercurrent ICD shock-appropriate VF  A recent Myoview demonstrating ejection fraction 38% with inferolateral hypokinesis   The patient denies chest pain, shortness of breath, nocturnal dyspnea, orthopnea or peripheral edema. There have been no palpitations, lightheadedness or syncope.    7/17 K4.2 hemoglobin 15.2 Cr 1.17   Past Medical History:  Diagnosis Date  . Blood in stool   . Cardiac arrest - ventricular fibrillation 02/11/2009   a. s/p ICD  . CHF (congestive heart failure) ([place])   . Coronary artery disease    s/p CABG  . Hyperlipidemia   . Hypertension   . Ischemic cardiomyopathy   . Marijuana abuse   . Myocardial infarct ([name])    NON-ST-SEGMENT ELEVATION MI  . Peripheral vascular disease, unspecified ([place])     Past Surgical History:  Procedure Laterality Date  . CARDIAC DEFIBRILLATOR PLACEMENT     STJ single chamber ICD implanted for secondary prevention  . CIRCUMCISION    . CORONARY ARTERY BYPASS GRAFT  06/17/01   X 4    Current Outpatient Prescriptions  Medication Sig Dispense Refill  . acetaminophen (TYLENOL) 500 MG tablet Take 1,000 mg by mouth every 8 (eight) hours as needed (pain).    [name] albuterol (PROVENTIL HFA;VENTOLIN HFA) 108 (90 BASE) MCG/ACT inhaler Inhale 1-2 puffs into the lungs every 6 (six) hours as needed for wheezing or shortness of breath. 1 Inhaler 0  . aspirin 81 MG EC tablet Take 81 mg by mouth every morning.     [name] atorvastatin (LIPITOR) 80 MG tablet Take 1 tablet (80 mg total) by mouth daily at 6 PM. 30 tablet 0  . carvedilol (COREG) 25 MG tablet take 1 tablet by mouth twice a day 180 tablet 2  . dicyclomine (BENTYL) 20  MG tablet Take 1 tablet (20 mg total) by mouth 2 (two) times daily. 20 tablet 0  . esomeprazole (NEXIUM) 40 MG capsule Take 1 capsule (40 mg total) by mouth daily. 90 capsule 0  . furosemide (LASIX) 20 MG tablet take 1 tablet by mouth once daily 90 tablet 2  . ibuprofen (ADVIL,MOTRIN) 800 MG tablet Take 800 mg by mouth every 8 (eight) hours as needed for headache or cramping.    [name] lisinopril (PRINIVIL,ZESTRIL) 20 MG tablet take 1 tablet by mouth every morning 90 tablet 0  . Multiple Vitamin (MULTIVITAMIN) tablet Take 1 tablet by mouth every morning.     . ondansetron (ZOFRAN) 4 MG tablet Take 1 tablet (4 mg total) by mouth every 6 (six) hours. 12 tablet 0  . potassium chloride SA (K-DUR,KLOR-CON) 20 MEQ tablet Take 1 tablet (20 mEq total) by mouth daily. 30 tablet 0   No current facility-administered medications for this visit.     No Known Allergies  Review of Systems negative except from HPI and PMH  Physical Exam BP 116/68   Pulse 73   Ht 6' (1.829 m)   Wt 218 lb (98.9 kg)   SpO2 95%   BMI 29.57 kg/m  Well developed and well nourished in no acute distress HENT normal E scleral and icterus clear Neck Supple JVP flat; carotids brisk and full Clear  to ausculation Device pocket well healed; without hematoma or erythema.  There is no tethering *Regular rate and rhythm, no murmurs gallops or rub Soft with active bowel sounds No clubbing cyanosis  Edema Alert and oriented, grossly normal motor and sensory function Skin Warm and Dry  ECG demonstrates sinus rhythm at 66 Intervals 13/09/40 Otherwise normal  Assessment and  Plan  Aborted cardiac arrest  No intercurrent Ventricular tachycardia  Ischemic heart disease with prior bypass surgery  Hyperlipidemia  Defibrillator-[employer] The patient's device was interrogated.  The information was reviewed. No changes were made in the programming.    Without symptoms of ischemia  Will recheck LDL as > goal

## 2015-12-01 NOTE — Patient Instructions (Addendum)
Medication Instructions: Your physician recommends that you continue on your current medications as directed. Please refer to the Current Medication list given to you today.   Labwork: Your physician recommends that you return for a FASTING lipid profile: Tuesday 12/07/15- lab opens at 7:30 am  Procedures/Testing: Lipid panel  Follow-Up: Remote monitoring is used to monitor your Pacemaker of ICD from home. This monitoring reduces the number of office visits required to check your device to one time per year. It allows [place] to keep an eye on the functioning of your device to ensure it is working properly. You are scheduled for a device check from home on 03/01/16. You may send your transmission at any time that day. If you have a wireless device, the transmission will be sent automatically. After your physician reviews your transmission, you will receive a postcard with your next transmission date.   Your physician wants you to follow-up in: 1 YEAR with Dr. Caryl Comes. You will receive a reminder letter in the mail two months in advance. If you don't receive a letter, please call our office to schedule the follow-up appointment.  Any Additional Special Instructions Will Be Listed Below (If Applicable).     If you need a refill on your cardiac medications before your next appointment, please call your pharmacy.

## 2015-12-07 ENCOUNTER — Other Ambulatory Visit (INDEPENDENT_AMBULATORY_CARE_PROVIDER_SITE_OTHER): Payer: Medicare Other | Admitting: *Deleted

## 2015-12-07 DIAGNOSIS — E785 Hyperlipidemia, unspecified: Secondary | ICD-10-CM

## 2015-12-07 LAB — LIPID PANEL
CHOL/HDL RATIO: 3.1 ratio (ref <=5.0)
CHOLESTEROL: 141 mg/dL (ref 125–200)
HDL: 46 mg/dL (ref >=40)
LDL CALC: 76 mg/dL (ref <130)
Triglycerides: 96 mg/dL (ref <150)
VLDL: 19 mg/dL (ref <30)

## 2015-12-10 ENCOUNTER — Telehealth: Payer: Self-pay

## 2015-12-10 NOTE — Telephone Encounter (Signed)
lmtcb to discuss results on home number. Mobile number does not have an answering machine set up.

## 2015-12-10 NOTE — Telephone Encounter (Signed)
Spoke with pt's wife and informed her of normal lab results. Let her know that all his levels were with range and it was stable. She was very happy and agreeable and understood.

## 2015-12-30 ENCOUNTER — Encounter: Payer: Self-pay | Admitting: Nurse Practitioner

## 2016-01-10 ENCOUNTER — Encounter (INDEPENDENT_AMBULATORY_CARE_PROVIDER_SITE_OTHER): Payer: Self-pay

## 2016-01-10 ENCOUNTER — Other Ambulatory Visit (INDEPENDENT_AMBULATORY_CARE_PROVIDER_SITE_OTHER): Payer: Medicare Other

## 2016-01-10 ENCOUNTER — Encounter: Payer: Self-pay | Admitting: Nurse Practitioner

## 2016-01-10 ENCOUNTER — Ambulatory Visit (INDEPENDENT_AMBULATORY_CARE_PROVIDER_SITE_OTHER): Payer: Medicare Other | Admitting: Nurse Practitioner

## 2016-01-10 VITALS — BP 122/80 | HR 68 | Ht 70.0 in | Wt 223.1 lb

## 2016-01-10 DIAGNOSIS — K219 Gastro-esophageal reflux disease without esophagitis: Secondary | ICD-10-CM | POA: Diagnosis not present

## 2016-01-10 DIAGNOSIS — R1084 Generalized abdominal pain: Secondary | ICD-10-CM | POA: Diagnosis not present

## 2016-01-10 DIAGNOSIS — I255 Ischemic cardiomyopathy: Secondary | ICD-10-CM | POA: Diagnosis not present

## 2016-01-10 DIAGNOSIS — K5900 Constipation, unspecified: Secondary | ICD-10-CM | POA: Diagnosis not present

## 2016-01-10 DIAGNOSIS — G8929 Other chronic pain: Secondary | ICD-10-CM

## 2016-01-10 DIAGNOSIS — R1013 Epigastric pain: Secondary | ICD-10-CM | POA: Diagnosis not present

## 2016-01-10 LAB — LIPASE: LIPASE: 12 U/L (ref 11.0–59.0)

## 2016-01-10 LAB — CBC
HEMATOCRIT: 47.3 % (ref 39.0–52.0)
HEMOGLOBIN: 16 g/dL (ref 13.0–17.0)
MCHC: 33.9 g/dL (ref 30.0–36.0)
MCV: 84.9 fl (ref 78.0–100.0)
PLATELETS: 237 10*3/uL (ref 150.0–400.0)
RBC: 5.58 Mil/uL (ref 4.22–5.81)
RDW: 14.8 % (ref 11.5–15.5)
WBC: 9.8 10*3/uL (ref 4.0–10.5)

## 2016-01-10 LAB — COMPREHENSIVE METABOLIC PANEL
ALBUMIN: 4 g/dL (ref 3.5–5.2)
ALK PHOS: 88 U/L (ref 39–117)
ALT: 27 U/L (ref 0–53)
AST: 23 U/L (ref 0–37)
BUN: 11 mg/dL (ref 6–23)
CALCIUM: 9.6 mg/dL (ref 8.4–10.5)
CHLORIDE: 107 meq/L (ref 96–112)
CO2: 26 mEq/L (ref 19–32)
Creatinine, Ser: 0.99 mg/dL (ref 0.40–1.50)
GFR: 103.05 mL/min (ref >60.00)
Glucose, Bld: 98 mg/dL (ref 70–99)
POTASSIUM: 4 meq/L (ref 3.5–5.1)
SODIUM: 140 meq/L (ref 135–145)
TOTAL PROTEIN: 7.3 g/dL (ref 6.0–8.3)
Total Bilirubin: 0.4 mg/dL (ref 0.2–1.2)

## 2016-01-10 MED ORDER — DICYCLOMINE HCL 20 MG PO TABS: 20.0000 mg | ORAL_TABLET | Freq: Two times a day (BID) | ORAL | 3 refills | Status: DC

## 2016-01-10 NOTE — Patient Instructions (Signed)
Please continue reflux medication in AM  Take Zantac 150 mg at bedtime  Start Benefiber daily, if no improvement start Miralax daily.  Thank you for choosing [facility]

## 2016-01-11 NOTE — Progress Notes (Signed)
Reviewed and agree with initial management plan.  Malcolm T. Stark, MD FACG 

## 2016-01-11 NOTE — Progress Notes (Signed)
HPI: Patient is 50 year old male here for evaluation of abdominal pain. Patient accompanied by his wife. He gives a 4-5 year history of intermittent mid abdominal pain associated with nausea and diaphoresis. Bentyl helps but doesn't take it on regular basis. He complains of "sour burps ". Episodes are becoming more frequent. When first started they occurred about every 3 days now occurring on a daily basis. Patient used to drink 3-4 beers a day but down to 1 can or less  week because it hurts his stomach . Patient had a cholecystectomy 6 years ago.   Patient has problems with intermittent constipation he has a high fiber diet with plenty of fruit. About every 2-3 months he sees blood in stool but only after consumption of spicy foods.    Past Medical History:  Diagnosis Date  . Cardiac arrest - ventricular fibrillation 02/11/2009   a. s/p ICD  . CHF (congestive heart failure) ([place])   . Coronary artery disease    s/p CABG  . Hyperlipidemia   . Hypertension   . Ischemic cardiomyopathy   . Marijuana abuse   . Myocardial infarct    NON-ST-SEGMENT ELEVATION MI  . Peripheral vascular disease, unspecified     Past Surgical History:  Procedure Laterality Date  . CARDIAC DEFIBRILLATOR PLACEMENT     STJ single chamber ICD implanted for secondary prevention  . CIRCUMCISION    . CORONARY ARTERY BYPASS GRAFT  06/17/01   X 4   Family History  Problem Relation Age of Onset  . Hypertension Mother   . Heart attack Father     DIED AT 31 FROM MI  . Heart disease Sister     CAD AND PREVIOUS CABG  . Hypertension      IN MOST OF HIS SIBLINGS   Social History  Substance Use Topics  . Smoking status: Former Smoker    Packs/day: 2.00    Years: 20.00    Quit date: 04/04/2003  . Smokeless tobacco: Never Used  . Alcohol use No   Current Outpatient Prescriptions  Medication Sig Dispense Refill  . acetaminophen (TYLENOL) 500 MG tablet Take 1,000 mg by mouth every 8 (eight) hours as needed  (pain).    [name] albuterol (PROVENTIL HFA;VENTOLIN HFA) 108 (90 BASE) MCG/ACT inhaler Inhale 1-2 puffs into the lungs every 6 (six) hours as needed for wheezing or shortness of breath. 1 Inhaler 0  . aspirin 81 MG EC tablet Take 81 mg by mouth every morning.     [name] atorvastatin (LIPITOR) 80 MG tablet Take 1 tablet (80 mg total) by mouth daily at 6 PM. 90 tablet 3  . carvedilol (COREG) 25 MG tablet Take 1 tablet (25 mg total) by mouth 2 (two) times daily. 180 tablet 3  . dicyclomine (BENTYL) 20 MG tablet Take 1 tablet (20 mg total) by mouth 2 (two) times daily. 30 tablet 3  . esomeprazole (NEXIUM) 40 MG capsule Take 1 capsule (40 mg total) by mouth daily. 30 capsule 0  . furosemide (LASIX) 20 MG tablet take 1 tablet by mouth once daily 90 tablet 2  . ibuprofen (ADVIL,MOTRIN) 800 MG tablet Take 800 mg by mouth every 8 (eight) hours as needed for headache or cramping.    [name] lisinopril (PRINIVIL,ZESTRIL) 20 MG tablet Take 1 tablet (20 mg total) by mouth every morning. 90 tablet 3  . Multiple Vitamin (MULTIVITAMIN) tablet Take 1 tablet by mouth every morning.     . nitroGLYCERIN (NITROSTAT) 0.4 MG SL  tablet Place 1 tablet (0.4 mg total) under the tongue every 5 (five) minutes as needed for chest pain. 25 tablet 3  . ondansetron (ZOFRAN) 4 MG tablet Take 1 tablet (4 mg total) by mouth every 6 (six) hours. 12 tablet 0  . potassium chloride SA (K-DUR,KLOR-CON) 20 MEQ tablet Take 1 tablet (20 mEq total) by mouth daily. 90 tablet 3   No current facility-administered medications for this visit.    No Known Allergies   Review of Systems: All systems reviewed and negative except where noted in HPI.    Physical Exam: BP 122/80   Pulse 68   Ht 5\' 10"  (1.778 m) Comment: w/o shoes  Wt 223 lb 2 oz (101.2 kg)   BMI 32.02 kg/m  Constitutional: Pleasant,well-developed, black male in no acute distress. HEENT: Normocephalic and atraumatic. Conjunctivae are normal. No scleral icterus. Neck supple.    Cardiovascular: Normal rate, regular rhythm.  Pulmonary/chest: Effort normal and breath sounds normal. No wheezing, rales or rhonchi. Abdominal: Soft, nondistended, nontender. Bowel sounds active throughout. There are no masses palpable. No hepatomegaly. Extremities: no edema Lymphadenopathy: No cervical adenopathy noted. Neurological: Alert and oriented to person place and time. Skin: Skin is warm and dry. No rashes noted. Psychiatric: Normal mood and affect. Behavior is normal.  ASSESSMENT AND PLAN:  61. 50 year old male with chronic (years)  mid upper abdominal pain and nausea unrelated to eating. Symptoms becoming more frequent, Bentyl helps but doesn't take on regular basis.  CT scans of abdomen and pelvis with contrast in 2013 and another in 2014 for same symptoms were unremarkable. Cause of chronic abdominal pain unclear but normal labs and stable weight are reassuring. -CMET, lipase, CBC today. -change timing of PPI to am before breakfast and add H2 in evening.  -Doesn't take Bentyl on a regular basis but finds it to be helpful. Advised taking it BID for now -Will call him with labs results -Follow up with me or Dr. Fuller Plan in 2 months. Follow up sooner if worsening symptoms.  Longstanding GERD. Breakthrough heartburn on daily PPI which he has taken for years.  -Change in PPI dosing to 30 minutes before breakfast -Zantac 150 milligrams every evening after dinner -GERD literature  Chronic, intermittent constipation. Feels he eats a high fiber diet. Doesn't like taste of fiber supplements such as metamucil.  -Trial of Miralax once daily  -continue high fiber diet.  Occasional rectal bleeding with BM (chronic) in setting of constipation. Normal hemoglobin in July. -need to treat constipation, see above.  -CBC today -Approaching age 10, needs a colonoscopy. Will discuss further at time of next visit but briefly discussed today and patient will be agreeable. May need to be done at  Hospital given his ejection fraction is borderline at 35%    Ischemic heart disease with hx of CABG. Hx of cardiac arrest, s/p ICD placement 2011.

## 2016-01-18 ENCOUNTER — Telehealth: Payer: Self-pay

## 2016-01-18 NOTE — Telephone Encounter (Signed)
Discussed with the patient and his spouse. The patient is taking his medications as directed. He will conform better with anti-reflux measures and his diet. Also having a little cramping and constipated stool. For this he will try Mirilax. Call back Friday to see if the reflux is improving. If not, he will try Carafate.

## 2016-01-18 NOTE — Telephone Encounter (Signed)
Patient is advised of his lab results. He reports he continues to have heartburn and reflux. He is on Zantac 150 mg at bedtime. Do you want him to take it twice daily?

## 2016-01-18 NOTE — Telephone Encounter (Signed)
He is on a PPI in am. If not on 150mg  of the zantac please increase the dose to 150mg . Please make sure he is following anti-reflux measures. We can try carafate tablets TID. Thanks

## 2016-01-18 NOTE — Telephone Encounter (Signed)
-----   Message from Willia Craze, NP sent at 01/17/2016  6:27 PM EDT ----- Billy [name], please let patient know that labs were ok. He should already have a follow up with Fuller Plan. Thanks

## 2016-01-21 NOTE — Telephone Encounter (Signed)
Left message to call if he was not improving or if he has questions.

## 2016-01-26 NOTE — Progress Notes (Signed)
HPI: FU CAD. Patient suffered a cardiac arrest in November of 2010. Cardiac catheterization at that time revealed an ejection fraction of 35%. Normal left main. The LAD had scattered 20-30% lesions. The first diagonal was occluded. The left circumflex had scattered 30-40% lesions and then occluded following the OM. The OM is stented, and the stent is widely patent without significant stenosis. The second OM and distal circumflex fill from left-to-left collaterals. The right coronary artery was known to be totally occluded in its proximal segment. The distal right coronary artery branches fill from left-to-right collaterals. The saphenous vein graft to first diagonal is widely patent. There is mild 40-50% stenosis of the diagonal just after the graft insertion site. Left internal mammary artery to LAD is patent. Saphenous vein graft to distal right coronary artery is known to be occluded. The saphenous vein graft to OM branch was totally occluded. This is a chronic occlusion as well. Echocardiogram initially revealed an EF of 10-15% however followup echo showed an EF of 40-45%, mild biatrial enlargement and mild mitral regurgitation. Given out of the hospital VF arrest a [employer] single lead ICD was placed. Nuclear study 1/14 showed EF 38, inferior MI with peri-infarct ischemia. Since I last saw him, the patient denies any dyspnea on exertion, orthopnea, PND, pedal edema, palpitations, syncope or chest pain.   Current Outpatient Prescriptions  Medication Sig Dispense Refill  . acetaminophen (TYLENOL) 500 MG tablet Take 1,000 mg by mouth every 8 (eight) hours as needed (pain).    [name] albuterol (PROVENTIL HFA;VENTOLIN HFA) 108 (90 BASE) MCG/ACT inhaler Inhale 1-2 puffs into the lungs every 6 (six) hours as needed for wheezing or shortness of breath. 1 Inhaler 0  . aspirin 81 MG EC tablet Take 81 mg by mouth every morning.     [name] atorvastatin (LIPITOR) 80 MG tablet Take 1 tablet (80 mg total) by mouth  daily at 6 PM. 90 tablet 3  . carvedilol (COREG) 25 MG tablet Take 1 tablet (25 mg total) by mouth 2 (two) times daily. 180 tablet 3  . dicyclomine (BENTYL) 20 MG tablet Take 1 tablet (20 mg total) by mouth 2 (two) times daily. 30 tablet 3  . esomeprazole (NEXIUM) 40 MG capsule Take 1 capsule (40 mg total) by mouth daily. 30 capsule 0  . furosemide (LASIX) 20 MG tablet take 1 tablet by mouth once daily 90 tablet 2  . ibuprofen (ADVIL,MOTRIN) 800 MG tablet Take 800 mg by mouth every 8 (eight) hours as needed for headache or cramping.    [name] lisinopril (PRINIVIL,ZESTRIL) 20 MG tablet Take 1 tablet (20 mg total) by mouth every morning. 90 tablet 3  . Multiple Vitamin (MULTIVITAMIN) tablet Take 1 tablet by mouth every morning.     . nitroGLYCERIN (NITROSTAT) 0.4 MG SL tablet Place 1 tablet (0.4 mg total) under the tongue every 5 (five) minutes as needed for chest pain. 25 tablet 3  . ondansetron (ZOFRAN) 4 MG tablet Take 1 tablet (4 mg total) by mouth every 6 (six) hours. 20 tablet 0  . potassium chloride SA (K-DUR,KLOR-CON) 20 MEQ tablet Take 1 tablet (20 mEq total) by mouth daily. 90 tablet 3  . sucralfate (CARAFATE) 1 g tablet Take 1 tablet (1 g total) by mouth 3 (three) times daily. 90 tablet 0   No current facility-administered medications for this visit.      Past Medical History:  Diagnosis Date  . Blood in stool   . Cardiac arrest -  ventricular fibrillation 02/11/2009   a. s/p ICD  . CHF (congestive heart failure) ([place])   . Coronary artery disease    s/p CABG  . Hyperlipidemia   . Hypertension   . Ischemic cardiomyopathy   . Marijuana abuse   . Myocardial infarct    NON-ST-SEGMENT ELEVATION MI  . Peripheral vascular disease, unspecified     Past Surgical History:  Procedure Laterality Date  . CARDIAC DEFIBRILLATOR PLACEMENT     STJ single chamber ICD implanted for secondary prevention  . CIRCUMCISION    . CORONARY ARTERY BYPASS GRAFT  06/17/01   X 4    Social History    Social History  . Marital status: Married    Spouse name: N/A  . Number of children: 3  . Years of education: 52   Occupational History  . Disability    Social History Main Topics  . Smoking status: Former Smoker    Packs/day: 2.00    Years: 20.00    Quit date: 04/04/2003  . Smokeless tobacco: Never Used  . Alcohol use No  . Drug use: No  . Sexual activity: Yes    Partners: Female   Other Topics Concern  . Not on file   Social History Narrative   MARRIED   FORMER TOBACCO USE, SMOKED FOR 20 YRS 2 PPD; QUIT IN 2005   NO ETOH   NO ILLICIT DRUG USE   NO REGULAR EXERCISE         ICD-[employer]. [employer] ; CERTIFIED LETTER SENT AND RETURNED BAD ADDRESS, PLS UPDATE DJW.      Fun: Fishing     Family History  Problem Relation Age of Onset  . Hypertension Mother   . Heart attack Father     DIED AT 75 FROM MI  . Heart disease Sister     CAD AND PREVIOUS CABG  . Hypertension      IN MOST OF HIS SIBLINGS    ROS: no fevers or chills, productive cough, hemoptysis, dysphasia, odynophagia, melena, hematochezia, dysuria, hematuria, rash, seizure activity, orthopnea, PND, pedal edema, claudication. Remaining systems are negative.  Physical Exam: Well-developed well-nourished in no acute distress.  Skin is warm and dry.  HEENT is normal.  Neck is supple.  Chest is clear to auscultation with normal expansion.  Cardiovascular exam is regular rate and rhythm.  Abdominal exam nontender or distended. No masses palpated. Extremities show no edema. neuro grossly intact  ECG  A/P  1 ICD-management per EP.  2 hypertension-blood pressure controlled. Continue present medications.   3 hyperlipidemia-continue statin.   4 coronary artery disease-continue aspirin and statin.  5 ischemic cardiomyopathy-continue ACE inhibitor and beta blocker.  6 Tobacco abuse-patient counseled on discontinuing.    Kirk Ruths, MD

## 2016-01-31 ENCOUNTER — Other Ambulatory Visit: Payer: Medicare Other

## 2016-01-31 ENCOUNTER — Encounter: Payer: Self-pay | Admitting: Family

## 2016-01-31 ENCOUNTER — Telehealth: Payer: Self-pay | Admitting: Family

## 2016-01-31 ENCOUNTER — Ambulatory Visit (INDEPENDENT_AMBULATORY_CARE_PROVIDER_SITE_OTHER): Payer: Medicare Other | Admitting: Family

## 2016-01-31 VITALS — BP 122/72 | HR 59 | Temp 97.9°F | Resp 16 | Ht 70.0 in | Wt 228.0 lb

## 2016-01-31 DIAGNOSIS — K2100 Gastro-esophageal reflux disease with esophagitis, without bleeding: Secondary | ICD-10-CM

## 2016-01-31 DIAGNOSIS — I10 Essential (primary) hypertension: Secondary | ICD-10-CM

## 2016-01-31 DIAGNOSIS — K219 Gastro-esophageal reflux disease without esophagitis: Secondary | ICD-10-CM | POA: Insufficient documentation

## 2016-01-31 DIAGNOSIS — I255 Ischemic cardiomyopathy: Secondary | ICD-10-CM | POA: Diagnosis not present

## 2016-01-31 DIAGNOSIS — E782 Mixed hyperlipidemia: Secondary | ICD-10-CM | POA: Diagnosis not present

## 2016-01-31 MED ORDER — ONDANSETRON HCL 4 MG PO TABS: 4.0000 mg | ORAL_TABLET | Freq: Four times a day (QID) | ORAL | 0 refills | Status: DC

## 2016-01-31 MED ORDER — SUCRALFATE 1 G PO TABS: 1.0000 g | ORAL_TABLET | Freq: Three times a day (TID) | ORAL | 0 refills | Status: DC

## 2016-01-31 NOTE — Patient Instructions (Signed)
Thank you for choosing [facility].  SUMMARY AND INSTRUCTIONS:  Medication:  Please continue to take her medications as prescribed.  Start Carafate 3 times daily.  Start Flonase 2 sprays per nostril nightly. If this does not improve symptoms please let us know.  Your prescription(s) have been submitted to your pharmacy or been printed and provided for you. Please take as directed and contact our office if you believe you are having problem(s) with the medication(s) or have any questions.  Labs:  Please stop by the lab on the lower level of the building for your blood work. Your results will be released to [facility] (or called to you) after review, usually within 72 hours after test completion. If any changes need to be made, you will be notified at that same time.  1.) The lab is open from 7:30am to 5:30 pm Monday-Friday 2.) No appointment is necessary 3.) Fasting (if needed) is 6-8 hours after food and drink; black coffee and water are okay   Follow up:  If your symptoms worsen or fail to improve, please contact our office for further instruction, or in case of emergency go directly to the emergency room at the closest medical facility.   Food Choices for Gastroesophageal Reflux Disease, Adult When you have gastroesophageal reflux disease (GERD), the foods you eat and your eating habits are very important. Choosing the right foods can help ease the discomfort of GERD. WHAT GENERAL GUIDELINES DO I NEED TO FOLLOW?  Choose fruits, vegetables, whole grains, low-fat dairy products, and low-fat meat, fish, and poultry.  Limit fats such as oils, salad dressings, butter, nuts, and avocado.  Keep a food diary to identify foods that cause symptoms.  Avoid foods that cause reflux. These may be different for different people.  Eat frequent small meals instead of three large meals each day.  Eat your meals slowly, in a relaxed setting.  Limit fried foods.  Cook foods using  methods other than frying.  Avoid drinking alcohol.  Avoid drinking large amounts of liquids with your meals.  Avoid bending over or lying down until 2-3 hours after eating. WHAT FOODS ARE NOT RECOMMENDED? The following are some foods and drinks that may worsen your symptoms: Vegetables Tomatoes. Tomato juice. Tomato and spaghetti sauce. Chili peppers. Onion and garlic. Horseradish. Fruits Oranges, grapefruit, and lemon (fruit and juice). Meats High-fat meats, fish, and poultry. This includes hot dogs, ribs, ham, sausage, salami, and bacon. Dairy Whole milk and chocolate milk. Sour cream. Cream. Butter. Ice cream. Cream cheese.  Beverages Coffee and tea, with or without caffeine. Carbonated beverages or energy drinks. Condiments Hot sauce. Barbecue sauce.  Sweets/Desserts Chocolate and cocoa. Donuts. Peppermint and spearmint. Fats and Oils High-fat foods, including [place] fries and potato chips. Other Vinegar. Strong spices, such as black pepper, white pepper, red pepper, cayenne, curry powder, cloves, ginger, and chili powder. The items listed above may not be a complete list of foods and beverages to avoid. Contact your dietitian for more information.   This information is not intended to replace advice given to you by your health care Yannick Steuber. Make sure you discuss any questions you have with your health care Natasha Burda.   Document Released: 03/20/2005 Document Revised: 04/10/2014 Document Reviewed: 01/22/2013 Elsevier Interactive Patient Education [employer].

## 2016-01-31 NOTE — Telephone Encounter (Signed)
Medication refilled

## 2016-01-31 NOTE — Telephone Encounter (Signed)
Pt request refill for ondansetron (ZOFRAN) 4 MG tablet [facility]. Please advise, he is out of this med.

## 2016-01-31 NOTE — Assessment & Plan Note (Signed)
Blood pressure appears adequately controlled blood goal 140/90 with current regimen and no adverse side effects. Continue current dosage of carvedilol, furosemide and lisinopril. Continue to monitor blood pressure at home and follow low sodium diet. Continue to monitor.

## 2016-01-31 NOTE — Assessment & Plan Note (Signed)
Most recent Myoview with EF of 38% and maintained on lisinopril, furosemide and carvedilol with no adverse side effects. Consider possible change to [facility] given decreased EF. Will discuss with cardiology. Appears stable with no significant lower extremity edema or shortness of breath. Continue with management and changes per cardiology.

## 2016-01-31 NOTE — Assessment & Plan Note (Addendum)
Gastroesophageal reflux appears uncontrolled with current regimen of Nexium and attempted diet control. Start sucralfate. Obtain H. Pylori. Advised to continue follow-up with gastroenterology for further assessment and treatment. Continue to monitor.

## 2016-01-31 NOTE — Assessment & Plan Note (Signed)
Blood work reviewed and appears stable with current regimen of atorvastatin without myalgias or adverse side effects. Continue current dosage atorvastatin.

## 2016-01-31 NOTE — Progress Notes (Signed)
Subjective:    Patient ID: Billy [name], male    DOB: 1965-10-26, 50 y.o.   MRN: [id number]  Chief Complaint  Patient presents with  . Establish Care    has a knot on his head that came up when he was in the hospital, acid reflux is bad,  needs allergy medication, refill on zofran and bentyl and 90 day nexium    HPI:  Billy [name] is a 50 y.o. male who  has a past medical history of Blood in stool; Cardiac arrest - ventricular fibrillation (02/11/2009); CHF (congestive heart failure) ([place]); Coronary artery disease; Hyperlipidemia; Hypertension; Ischemic cardiomyopathy; Marijuana abuse; Myocardial infarct; and Peripheral vascular disease, unspecified. and presents today for an office visit to establish care.   1.) GERD - Long standing symptoms of GERD and currently maintained on Nexium; dicyclomine for abdominal cramping; and ondansetron for nausea. Reports taking the medications as prescribed and denies adverse side effects. He most recently was started on Zantac which did not help very much. He is working on staying away from greasy and spice types foods. Expresses constipation and cramping and feeling with epigastric discomfort. No recent vomiting with occasional blood in stool. Does take NSAIDs on occasion but denies overuse or excessive usage.   2.) Seasonal allergies - This is a new problem. Associated symptom of coughing, sneezing, watery itchy eyes has been going on for several weeks. Modifying factors include Zyrtec which did help for a little while and then stopped working. Describes having a lot of mucus in his throat. No fevers. Symptoms generally wax and wane with the course being the same.    No Known Allergies    Outpatient Medications Prior to Visit  Medication Sig Dispense Refill  . acetaminophen (TYLENOL) 500 MG tablet Take 1,000 mg by mouth every 8 (eight) hours as needed (pain).    [name] albuterol (PROVENTIL HFA;VENTOLIN HFA) 108 (90 BASE) MCG/ACT inhaler Inhale 1-2  puffs into the lungs every 6 (six) hours as needed for wheezing or shortness of breath. 1 Inhaler 0  . aspirin 81 MG EC tablet Take 81 mg by mouth every morning.     [name] atorvastatin (LIPITOR) 80 MG tablet Take 1 tablet (80 mg total) by mouth daily at 6 PM. 90 tablet 3  . carvedilol (COREG) 25 MG tablet Take 1 tablet (25 mg total) by mouth 2 (two) times daily. 180 tablet 3  . dicyclomine (BENTYL) 20 MG tablet Take 1 tablet (20 mg total) by mouth 2 (two) times daily. 30 tablet 3  . esomeprazole (NEXIUM) 40 MG capsule Take 1 capsule (40 mg total) by mouth daily. 30 capsule 0  . furosemide (LASIX) 20 MG tablet take 1 tablet by mouth once daily 90 tablet 2  . ibuprofen (ADVIL,MOTRIN) 800 MG tablet Take 800 mg by mouth every 8 (eight) hours as needed for headache or cramping.    [name] lisinopril (PRINIVIL,ZESTRIL) 20 MG tablet Take 1 tablet (20 mg total) by mouth every morning. 90 tablet 3  . Multiple Vitamin (MULTIVITAMIN) tablet Take 1 tablet by mouth every morning.     . nitroGLYCERIN (NITROSTAT) 0.4 MG SL tablet Place 1 tablet (0.4 mg total) under the tongue every 5 (five) minutes as needed for chest pain. 25 tablet 3  . ondansetron (ZOFRAN) 4 MG tablet Take 1 tablet (4 mg total) by mouth every 6 (six) hours. 12 tablet 0  . potassium chloride SA (K-DUR,KLOR-CON) 20 MEQ tablet Take 1 tablet (20 mEq total) by mouth daily. 90 tablet  3   No facility-administered medications prior to visit.      Past Medical History:  Diagnosis Date  . Blood in stool   . Cardiac arrest - ventricular fibrillation 02/11/2009   a. s/p ICD  . CHF (congestive heart failure) ([place])   . Coronary artery disease    s/p CABG  . Hyperlipidemia   . Hypertension   . Ischemic cardiomyopathy   . Marijuana abuse   . Myocardial infarct    NON-ST-SEGMENT ELEVATION MI  . Peripheral vascular disease, unspecified       Past Surgical History:  Procedure Laterality Date  . CARDIAC DEFIBRILLATOR PLACEMENT     STJ single chamber  ICD implanted for secondary prevention  . CIRCUMCISION    . CORONARY ARTERY BYPASS GRAFT  06/17/01   X 4      Family History  Problem Relation Age of Onset  . Hypertension Mother   . Heart attack Father     DIED AT 72 FROM MI  . Heart disease Sister     CAD AND PREVIOUS CABG  . Hypertension      IN MOST OF HIS SIBLINGS      Social History   Social History  . Marital status: Married    Spouse name: N/A  . Number of children: 3  . Years of education: 29   Occupational History  . Disability    Social History Main Topics  . Smoking status: Former Smoker    Packs/day: 2.00    Years: 20.00    Quit date: 04/04/2003  . Smokeless tobacco: Never Used  . Alcohol use No  . Drug use: No  . Sexual activity: Yes    Partners: Female   Other Topics Concern  . Not on file   Social History Narrative   MARRIED   FORMER TOBACCO USE, SMOKED FOR 20 YRS 2 PPD; QUIT IN 2005   NO ETOH   NO ILLICIT DRUG USE   NO REGULAR EXERCISE         ICD-[employer] [facility] ; CERTIFIED LETTER SENT AND RETURNED BAD ADDRESS, PLS UPDATE DJW.      Fun: Fishing       Review of Systems  Constitutional: Negative for chills and fever.  Eyes: Positive for itching.  Respiratory: Positive for cough. Negative for chest tightness and shortness of breath.   Cardiovascular: Negative for chest pain, palpitations and leg swelling.  Gastrointestinal: Positive for abdominal pain, blood in stool, constipation and nausea. Negative for diarrhea, rectal pain and vomiting.       Objective:    BP 122/72 (BP Location: Left Arm, Patient Position: Sitting, Cuff Size: Large)   Pulse (!) 59   Temp 97.9 F (36.6 C) (Oral)   Resp 16   Ht 5\' 10"  (1.778 m)   Wt 228 lb (103.4 kg)   SpO2 98%   BMI 32.71 kg/m  Nursing note and vital signs reviewed.  Physical Exam  Constitutional: He is oriented to person, place, and time. He appears well-developed and well-nourished. No distress.  HENT:  Right Ear: Hearing, tympanic  membrane, external ear and ear canal normal.  Left Ear: Hearing, tympanic membrane, external ear and ear canal normal.  Nose: Nose normal. Right sinus exhibits no maxillary sinus tenderness and no frontal sinus tenderness. Left sinus exhibits no maxillary sinus tenderness and no frontal sinus tenderness.  Mouth/Throat: Uvula is midline, oropharynx is clear and moist and mucous membranes are normal.  Cardiovascular: Normal rate, regular rhythm, normal heart sounds  and intact distal pulses.   Pulmonary/Chest: Effort normal and breath sounds normal.  Abdominal: Soft. Normal appearance and bowel sounds are normal. He exhibits no mass. There is no hepatosplenomegaly. There is no tenderness. There is no rigidity, no rebound, no guarding, no tenderness at McBurney's point and negative Murphy's sign.  Neurological: He is alert and oriented to person, place, and time.  Skin: Skin is warm and dry.  Psychiatric: He has a normal mood and affect. His behavior is normal. Judgment and thought content normal.        Assessment & Plan:   Problem List Items Addressed This Visit      Cardiovascular and Mediastinum   Essential hypertension    Blood pressure appears adequately controlled blood goal 140/90 with current regimen and no adverse side effects. Continue current dosage of carvedilol, furosemide and lisinopril. Continue to monitor blood pressure at home and follow low sodium diet. Continue to monitor.       Cardiomyopathy, ischemic    Most recent Myoview with EF of 38% and maintained on lisinopril, furosemide and carvedilol with no adverse side effects. Consider possible change to [facility] given decreased EF. Will discuss with cardiology. Appears stable with no significant lower extremity edema or shortness of breath. Continue with management and changes per cardiology.        Digestive   GERD (gastroesophageal reflux disease) - Primary    Gastroesophageal reflux appears uncontrolled with current  regimen of Nexium and attempted diet control. Start sucralfate. Obtain H. Pylori. Advised to continue follow-up with gastroenterology for further assessment and treatment. Continue to monitor.      Relevant Medications   sucralfate (CARAFATE) 1 g tablet   Other Relevant Orders   H Pylori, IGM, IGG, IGA AB     Other   Hyperlipidemia    Blood work reviewed and appears stable with current regimen of atorvastatin without myalgias or adverse side effects. Continue current dosage atorvastatin.       Other Visit Diagnoses   None.      I am having Mr. Schlepp start on sucralfate. I am also having him maintain his aspirin, multivitamin, albuterol, furosemide, acetaminophen, ondansetron, ibuprofen, atorvastatin, carvedilol, esomeprazole, lisinopril, potassium chloride SA, nitroGLYCERIN, and dicyclomine.   Meds ordered this encounter  Medications  . sucralfate (CARAFATE) 1 g tablet    Sig: Take 1 tablet (1 g total) by mouth 3 (three) times daily.    Dispense:  90 tablet    Refill:  0    Order Specific Question:   Supervising Provider    Answer:   Pricilla Holm A [id number]     Follow-up: Return if symptoms worsen or fail to improve.  Mauricio Po, FNP

## 2016-02-01 ENCOUNTER — Telehealth: Payer: Self-pay | Admitting: Nurse Practitioner

## 2016-02-01 NOTE — Telephone Encounter (Signed)
Pt aware.

## 2016-02-01 NOTE — Telephone Encounter (Signed)
Patient seen by primary care yesterday.  GERD (gastroesophageal reflux disease) - Primary     Gastroesophageal reflux appears uncontrolled with current regimen of Nexium and attempted diet control. Start sucralfate. Obtain H. Pylori. Advised to continue follow-up with gastroenterology for further assessment and treatment. Continue to monitor.     Relevant Medications   sucralfate (CARAFATE) 1 g tablet   Other Relevant Orders   H Pylori, IGM, IGG, IGA AB   Spoke with Mrs. Rosales. Patient has taken the Carafate, but continues to have terrible stomach pain. It seems to be in relation to him eating. The pain gets much worse after a meal.  Agrees to an appointment for re-evaluation.

## 2016-02-02 ENCOUNTER — Ambulatory Visit: Payer: Medicare Other | Admitting: Nurse Practitioner

## 2016-02-02 LAB — H PYLORI, IGM, IGG, IGA AB
H Pylori IgG: 1.3 U/mL — ABNORMAL HIGH (ref 0.0–0.8)
H pylori, IgM Abs: <9 units (ref 0.0–8.9)
H. pylori, IgA Abs: <9 units (ref 0.0–8.9)

## 2016-02-06 ENCOUNTER — Encounter: Payer: Self-pay | Admitting: Family

## 2016-02-07 ENCOUNTER — Telehealth: Payer: Self-pay | Admitting: *Deleted

## 2016-02-07 ENCOUNTER — Ambulatory Visit (INDEPENDENT_AMBULATORY_CARE_PROVIDER_SITE_OTHER): Payer: Medicare Other | Admitting: Cardiology

## 2016-02-07 ENCOUNTER — Encounter: Payer: Self-pay | Admitting: Cardiology

## 2016-02-07 VITALS — BP 116/78 | HR 61 | Ht 70.0 in | Wt 222.0 lb

## 2016-02-07 DIAGNOSIS — E7800 Pure hypercholesterolemia, unspecified: Secondary | ICD-10-CM

## 2016-02-07 DIAGNOSIS — I2510 Atherosclerotic heart disease of native coronary artery without angina pectoris: Secondary | ICD-10-CM

## 2016-02-07 DIAGNOSIS — I10 Essential (primary) hypertension: Secondary | ICD-10-CM

## 2016-02-07 DIAGNOSIS — I255 Ischemic cardiomyopathy: Secondary | ICD-10-CM | POA: Diagnosis not present

## 2016-02-07 MED ORDER — LEVOFLOXACIN 500 MG PO TABS: 500.0000 mg | ORAL_TABLET | Freq: Every day | ORAL | 0 refills | Status: DC

## 2016-02-07 MED ORDER — MONTELUKAST SODIUM 10 MG PO TABS: 10.0000 mg | ORAL_TABLET | Freq: Every day | ORAL | 0 refills | Status: DC

## 2016-02-07 MED ORDER — AMOXICILLIN 500 MG PO CAPS: 1000.0000 mg | ORAL_CAPSULE | Freq: Two times a day (BID) | ORAL | 0 refills | Status: DC

## 2016-02-07 NOTE — Telephone Encounter (Signed)
Wife left msg on triage stating needing Marya Amsler to rx husband something for his allergies. Called pt back to get more info. Wife states Marya Amsler recommended that he use otc flonase, and which he has but he is still having a lot of drainage especially at night. Also the GI sxs are no better still cramping would like him to go ahead and rx something for the infection...Johny Chess

## 2016-02-07 NOTE — Patient Instructions (Signed)
Your physician wants you to follow-up in: ONE YEAR WITH DR CRENSHAW You will receive a reminder letter in the mail two months in advance. If you don't receive a letter, please call our office to schedule the follow-up appointment.   If you need a refill on your cardiac medications before your next appointment, please call your pharmacy.  

## 2016-02-07 NOTE — Telephone Encounter (Signed)
Medications have been sent for the infection and for his allergies.

## 2016-02-07 NOTE — Telephone Encounter (Signed)
Notified pt/wife [facility],/lmb

## 2016-02-19 ENCOUNTER — Other Ambulatory Visit: Payer: Self-pay | Admitting: Internal Medicine

## 2016-02-21 NOTE — Telephone Encounter (Signed)
Dr. Crenshaw pt. °

## 2016-02-27 ENCOUNTER — Other Ambulatory Visit: Payer: Self-pay | Admitting: Family

## 2016-02-27 DIAGNOSIS — K2100 Gastro-esophageal reflux disease with esophagitis, without bleeding: Secondary | ICD-10-CM

## 2016-03-01 ENCOUNTER — Ambulatory Visit (INDEPENDENT_AMBULATORY_CARE_PROVIDER_SITE_OTHER): Payer: Medicare Other | Admitting: *Deleted

## 2016-03-01 DIAGNOSIS — I255 Ischemic cardiomyopathy: Secondary | ICD-10-CM | POA: Diagnosis not present

## 2016-03-02 NOTE — Progress Notes (Signed)
Remote ICD transmission.   

## 2016-03-12 ENCOUNTER — Other Ambulatory Visit: Payer: Self-pay | Admitting: Family

## 2016-03-14 ENCOUNTER — Encounter: Payer: Self-pay | Admitting: Cardiology

## 2016-03-15 ENCOUNTER — Ambulatory Visit: Payer: Medicare Other | Admitting: Gastroenterology

## 2016-03-28 ENCOUNTER — Telehealth: Payer: Self-pay

## 2016-03-28 NOTE — Telephone Encounter (Signed)
Needs to have a follow up office visit to ensure he still or does have an infection.

## 2016-03-28 NOTE — Telephone Encounter (Signed)
Pts wife called and stated that pt is having stomach issues like last time he was here. She said it was IBS. Asking that you send in the exact same antibiotics that you sent in last time he had this issue. Please advise

## 2016-03-28 NOTE — Telephone Encounter (Signed)
Pts wife is aware that he needs to come in for an appointment to be tested again for H. Pylori

## 2016-03-29 ENCOUNTER — Encounter: Payer: Self-pay | Admitting: Cardiology

## 2016-04-11 ENCOUNTER — Other Ambulatory Visit: Payer: Self-pay | Admitting: Family

## 2016-04-13 LAB — CUP PACEART REMOTE DEVICE CHECK
Date Time Interrogation Session: 20171129094844
HighPow Impedance: 54 Ohm
Implantable Lead Location: 753860
Implantable Pulse Generator Implant Date: 20101111
Lead Channel Pacing Threshold Amplitude: 0.75 V
Lead Channel Setting Sensing Sensitivity: 0.5 mV
MDC IDC LEAD IMPLANT DT: 20101111
MDC IDC LEAD MODEL: 7121
MDC IDC MSMT BATTERY REMAINING LONGEVITY: 53 mo
MDC IDC MSMT BATTERY REMAINING PERCENTAGE: 45 %
MDC IDC MSMT BATTERY VOLTAGE: 2.93 V
MDC IDC MSMT LEADCHNL RV IMPEDANCE VALUE: 480 Ohm
MDC IDC MSMT LEADCHNL RV PACING THRESHOLD PULSEWIDTH: 0.5 ms
MDC IDC MSMT LEADCHNL RV SENSING INTR AMPL: 12 mV
MDC IDC SET LEADCHNL RV PACING AMPLITUDE: 2.5 V
MDC IDC SET LEADCHNL RV PACING PULSEWIDTH: 0.5 ms
MDC IDC STAT BRADY RV PERCENT PACED: 1 %
Pulse Gen Serial Number: 743367

## 2016-04-23 ENCOUNTER — Other Ambulatory Visit: Payer: Self-pay | Admitting: Family

## 2016-04-29 ENCOUNTER — Other Ambulatory Visit: Payer: Self-pay | Admitting: Internal Medicine

## 2016-05-27 ENCOUNTER — Other Ambulatory Visit: Payer: Self-pay | Admitting: Family

## 2016-05-31 ENCOUNTER — Ambulatory Visit (INDEPENDENT_AMBULATORY_CARE_PROVIDER_SITE_OTHER): Payer: Medicare Other | Admitting: *Deleted

## 2016-05-31 DIAGNOSIS — I255 Ischemic cardiomyopathy: Secondary | ICD-10-CM

## 2016-05-31 NOTE — Progress Notes (Signed)
Remote ICD transmission.   

## 2016-06-01 ENCOUNTER — Encounter: Payer: Self-pay | Admitting: Cardiology

## 2016-06-06 ENCOUNTER — Other Ambulatory Visit: Payer: Medicare Other

## 2016-06-06 ENCOUNTER — Encounter: Payer: Self-pay | Admitting: Family

## 2016-06-06 ENCOUNTER — Ambulatory Visit (INDEPENDENT_AMBULATORY_CARE_PROVIDER_SITE_OTHER): Payer: Medicare Other | Admitting: Family

## 2016-06-06 VITALS — BP 124/78 | HR 55 | Temp 98.4°F | Resp 16 | Ht 70.0 in | Wt 220.0 lb

## 2016-06-06 DIAGNOSIS — I255 Ischemic cardiomyopathy: Secondary | ICD-10-CM | POA: Diagnosis not present

## 2016-06-06 DIAGNOSIS — R300 Dysuria: Secondary | ICD-10-CM

## 2016-06-06 LAB — CUP PACEART REMOTE DEVICE CHECK
Battery Remaining Longevity: 52 mo
Battery Remaining Percentage: 44 %
HighPow Impedance: 54 Ohm
Implantable Lead Location: 753860
Implantable Lead Model: 7121
Implantable Pulse Generator Implant Date: 20101111
Lead Channel Sensing Intrinsic Amplitude: 12 mV
Lead Channel Setting Pacing Amplitude: 2.5 V
Lead Channel Setting Pacing Pulse Width: 0.5 ms
MDC IDC LEAD IMPLANT DT: 20101111
MDC IDC MSMT BATTERY VOLTAGE: 2.93 V
MDC IDC MSMT LEADCHNL RV IMPEDANCE VALUE: 480 Ohm
MDC IDC MSMT LEADCHNL RV PACING THRESHOLD AMPLITUDE: 0.75 V
MDC IDC MSMT LEADCHNL RV PACING THRESHOLD PULSEWIDTH: 0.5 ms
MDC IDC PG SERIAL: 743367
MDC IDC SESS DTM: 20180228090011
MDC IDC SET LEADCHNL RV SENSING SENSITIVITY: 0.5 mV
MDC IDC STAT BRADY RV PERCENT PACED: 1 %

## 2016-06-06 LAB — POCT URINALYSIS DIPSTICK
Bilirubin, UA: NEGATIVE
Glucose, UA: NEGATIVE
KETONES UA: NEGATIVE
Nitrite, UA: NEGATIVE
PH UA: 6.5
PROTEIN UA: NEGATIVE
RBC UA: NEGATIVE
SPEC GRAV UA: 1.025
Urobilinogen, UA: NEGATIVE

## 2016-06-06 MED ORDER — CIPROFLOXACIN HCL 500 MG PO TABS: 500.0000 mg | ORAL_TABLET | Freq: Two times a day (BID) | ORAL | 0 refills | Status: DC

## 2016-06-06 MED ORDER — PHENAZOPYRIDINE HCL 100 MG PO TABS: 100.0000 mg | ORAL_TABLET | Freq: Three times a day (TID) | ORAL | 0 refills | Status: DC | PRN

## 2016-06-06 MED ORDER — ONDANSETRON HCL 4 MG PO TABS: 4.0000 mg | ORAL_TABLET | Freq: Four times a day (QID) | ORAL | 0 refills | Status: DC

## 2016-06-06 MED ORDER — DICYCLOMINE HCL 20 MG PO TABS: 20.0000 mg | ORAL_TABLET | Freq: Two times a day (BID) | ORAL | 3 refills | Status: DC

## 2016-06-06 NOTE — Patient Instructions (Signed)
Thank you for choosing [facility].  SUMMARY AND INSTRUCTIONS:  Please start taking the Ciprofloxacin  Start taking the pyridium as needed for burning.  Urine will be sent for culture.   Medication:  Your prescription(s) have been submitted to your pharmacy or been printed and provided for you. Please take as directed and contact our office if you believe you are having problem(s) with the medication(s) or have any questions.  Labs:  Please stop by the lab on the lower level of the building for your blood work. Your results will be released to [place] (or called to you) after review, usually within 72 hours after test completion. If any changes need to be made, you will be notified at that same time.  1.) The lab is open from 7:30am to 5:30 pm Monday-Friday 2.) No appointment is necessary 3.) Fasting (if needed) is 6-8 hours after food and drink; black coffee and water are okay   Follow up:  If your symptoms worsen or fail to improve, please contact our office for further instruction, or in case of emergency go directly to the emergency room at the closest medical facility.    Urinary Tract Infection, Adult A urinary tract infection (UTI) is an infection of any part of the urinary tract. The urinary tract includes the:  Kidneys.  Ureters.  Bladder.  Urethra. These organs make, store, and get rid of pee (urine) in the body. Follow these instructions at home:  Take over-the-counter and prescription medicines only as told by your doctor.  If you were prescribed an antibiotic medicine, take it as told by your doctor. Do not stop taking the antibiotic even if you start to feel better.  Avoid the following drinks:  Alcohol.  Caffeine.  Tea.  Carbonated drinks.  Drink enough fluid to keep your pee clear or pale yellow.  Keep all follow-up visits as told by your doctor. This is important.  Make sure to:  Empty your bladder often and completely. Do not to  hold pee for long periods of time.  Empty your bladder before and after sex.  Wipe from front to back after a bowel movement if you are male. Use each tissue one time when you wipe. Contact a doctor if:  You have back pain.  You have a fever.  You feel sick to your stomach (nauseous).  You throw up (vomit).  Your symptoms do not get better after 3 days.  Your symptoms go away and then come back. Get help right away if:  You have very bad back pain.  You have very bad lower belly (abdominal) pain.  You are throwing up and cannot keep down any medicines or water. This information is not intended to replace advice given to you by your health care provider. Make sure you discuss any questions you have with your health care provider. Document Released: 09/06/2007 Document Revised: 08/26/2015 Document Reviewed: 02/08/2015 Elsevier Interactive Patient Education  2017 [employer].

## 2016-06-06 NOTE — Progress Notes (Signed)
Subjective:    Patient ID: Billy [name], male    DOB: Aug 06, 1965, 51 y.o.   MRN: [id number]  Chief Complaint  Patient presents with  . Dysuria    feels irriation when urinating, x1 weeks does have some lower abdominal tenderness    HPI:  Billy [name] is a 51 y.o. male who  has a past medical history of Blood in stool; Cardiac arrest - ventricular fibrillation (02/11/2009); CHF (congestive heart failure) ([place]); Coronary artery disease; Hyperlipidemia; Hypertension; Ischemic cardiomyopathy; Marijuana abuse; Myocardial infarct; and Peripheral vascular disease, unspecified. and presents today for an acute office visit.  This is a new problem. Associated symptom of dysuria and increased frequency has been going on for about 1 week. Denies fevers. Denies fevers, chills or flu-like symptoms. No penile pain or discharge. Denies any modifying factors that make it better or worse. No previous history of urinary tract infection.   No Known Allergies    Outpatient Medications Prior to Visit  Medication Sig Dispense Refill  . acetaminophen (TYLENOL) 500 MG tablet Take 1,000 mg by mouth every 8 (eight) hours as needed (pain).    [name] albuterol (PROVENTIL HFA;VENTOLIN HFA) 108 (90 BASE) MCG/ACT inhaler Inhale 1-2 puffs into the lungs every 6 (six) hours as needed for wheezing or shortness of breath. 1 Inhaler 0  . aspirin 81 MG EC tablet Take 81 mg by mouth every morning.     [name] atorvastatin (LIPITOR) 80 MG tablet Take 1 tablet (80 mg total) by mouth daily at 6 PM. 90 tablet 3  . carvedilol (COREG) 25 MG tablet Take 1 tablet (25 mg total) by mouth 2 (two) times daily. 180 tablet 3  . esomeprazole (NEXIUM) 40 MG capsule TAKE 1 CAPSULE EVERY DAY 30 capsule 8  . furosemide (LASIX) 20 MG tablet TAKE 1 TABLET BY MOUTH EVERY DAY 90 tablet 1  . ibuprofen (ADVIL,MOTRIN) 800 MG tablet Take 800 mg by mouth every 8 (eight) hours as needed for headache or cramping.    [name] levofloxacin (LEVAQUIN) 500 MG tablet  Take 1 tablet (500 mg total) by mouth daily. 14 tablet 0  . lisinopril (PRINIVIL,ZESTRIL) 20 MG tablet Take 1 tablet (20 mg total) by mouth every morning. 90 tablet 3  . montelukast (SINGULAIR) 10 MG tablet TAKE 1 TABLET (10 MG TOTAL) BY MOUTH AT BEDTIME. 30 tablet 0  . Multiple Vitamin (MULTIVITAMIN) tablet Take 1 tablet by mouth every morning.     . potassium chloride SA (K-DUR,KLOR-CON) 20 MEQ tablet Take 1 tablet (20 mEq total) by mouth daily. 90 tablet 3  . sucralfate (CARAFATE) 1 g tablet TAKE 1 TABLET BY MOUTH 3 TIMES A DAY 90 tablet 0  . dicyclomine (BENTYL) 20 MG tablet Take 1 tablet (20 mg total) by mouth 2 (two) times daily. 30 tablet 3  . ondansetron (ZOFRAN) 4 MG tablet TAKE 1 TABLET BY MOUTH EVERY 6 HOURS 20 tablet 0  . nitroGLYCERIN (NITROSTAT) 0.4 MG SL tablet Place 1 tablet (0.4 mg total) under the tongue every 5 (five) minutes as needed for chest pain. 25 tablet 3  . amoxicillin (AMOXIL) 500 MG capsule Take 2 capsules (1,000 mg total) by mouth 2 (two) times daily. 56 capsule 0   No facility-administered medications prior to visit.       Past Surgical History:  Procedure Laterality Date  . CARDIAC DEFIBRILLATOR PLACEMENT     STJ single chamber ICD implanted for secondary prevention  . CIRCUMCISION    . CORONARY ARTERY BYPASS GRAFT  06/17/01   X 4      Past Medical History:  Diagnosis Date  . Blood in stool   . Cardiac arrest - ventricular fibrillation 02/11/2009   a. s/p ICD  . CHF (congestive heart failure) ([place])   . Coronary artery disease    s/p CABG  . Hyperlipidemia   . Hypertension   . Ischemic cardiomyopathy   . Marijuana abuse   . Myocardial infarct    NON-ST-SEGMENT ELEVATION MI  . Peripheral vascular disease, unspecified       Review of Systems  Constitutional: Negative for chills and fever.  Genitourinary: Positive for dysuria and frequency. Negative for hematuria, scrotal swelling, testicular pain and urgency.      Objective:    BP  124/78 (BP Location: Left Arm, Patient Position: Sitting, Cuff Size: Large)   Pulse (!) 55   Temp 98.4 F (36.9 C) (Oral)   Resp 16   Ht 5\' 10"  (1.778 m)   Wt 220 lb (99.8 kg)   SpO2 98%   BMI 31.57 kg/m  Nursing note and vital signs reviewed.  Physical Exam  Constitutional: He is oriented to person, place, and time. He appears well-developed and well-nourished. No distress.  Cardiovascular: Normal rate, regular rhythm, normal heart sounds and intact distal pulses.   Pulmonary/Chest: Effort normal and breath sounds normal.  Genitourinary: Testes normal and penis normal. Circumcised. No penile erythema or penile tenderness. No discharge found.  Neurological: He is alert and oriented to person, place, and time.  Skin: Skin is warm and dry.  Psychiatric: He has a normal mood and affect. His behavior is normal. Judgment and thought content normal.       Assessment & Plan:   Problem List Items Addressed This Visit      Other   Dysuria - Primary    In office urinalysis positive for leukocytes and negative for nitrites and hematuria. No pelvic or perirenal pain. No recent sexual activity. Advised to monitor for signs of prostatitis. Symptoms consistent with urinary tract infection. Urine will be sent for culture for confirmation. Start ciprofloxacin. Start Pyridium as needed for dysuria. Follow-up if symptoms worsen or do not improve.      Relevant Medications   ciprofloxacin (CIPRO) 500 MG tablet   Other Relevant Orders   POCT urinalysis dipstick (Completed)   Urine culture       I have discontinued Billy [name]'s amoxicillin. I have also changed his ondansetron. Additionally, I am having him start on ciprofloxacin and phenazopyridine. Lastly, I am having him maintain his aspirin, multivitamin, albuterol, acetaminophen, ibuprofen, atorvastatin, carvedilol, lisinopril, potassium chloride SA, nitroGLYCERIN, levofloxacin, esomeprazole, sucralfate, furosemide, montelukast, and  dicyclomine.   Meds ordered this encounter  Medications  . dicyclomine (BENTYL) 20 MG tablet    Sig: Take 1 tablet (20 mg total) by mouth 2 (two) times daily.    Dispense:  30 tablet    Refill:  3  . ondansetron (ZOFRAN) 4 MG tablet    Sig: Take 1 tablet (4 mg total) by mouth every 6 (six) hours.    Dispense:  20 tablet    Refill:  0  . ciprofloxacin (CIPRO) 500 MG tablet    Sig: Take 1 tablet (500 mg total) by mouth 2 (two) times daily.    Dispense:  14 tablet    Refill:  0    Order Specific Question:   Supervising Provider    Answer:   Pricilla Holm A [id number]  . phenazopyridine (PYRIDIUM) 100 MG tablet  Sig: Take 1 tablet (100 mg total) by mouth 3 (three) times daily as needed for pain.    Dispense:  10 tablet    Refill:  0    Order Specific Question:   Supervising Provider    Answer:   Pricilla Holm A [id number]     Follow-up: Return if symptoms worsen or fail to improve.  Mauricio Po, FNP

## 2016-06-06 NOTE — Assessment & Plan Note (Addendum)
In office urinalysis positive for leukocytes and negative for nitrites and hematuria. No pelvic or perirenal pain. No recent sexual activity. Advised to monitor for signs of prostatitis. Symptoms consistent with urinary tract infection. Urine will be sent for culture for confirmation. Start ciprofloxacin. Start Pyridium as needed for dysuria. Follow-up if symptoms worsen or do not improve.

## 2016-06-07 ENCOUNTER — Encounter: Payer: Self-pay | Admitting: Family

## 2016-06-07 LAB — URINE CULTURE: Organism ID, Bacteria: NO GROWTH

## 2016-06-15 ENCOUNTER — Encounter: Payer: Self-pay | Admitting: Cardiology

## 2016-06-21 ENCOUNTER — Ambulatory Visit: Payer: Medicare Other | Admitting: Gastroenterology

## 2016-06-26 ENCOUNTER — Telehealth: Payer: Self-pay | Admitting: Internal Medicine

## 2016-06-26 NOTE — Telephone Encounter (Signed)
Returned call to patient's wife no answer.LMTC. 

## 2016-06-26 NOTE — Telephone Encounter (Signed)
Follow Up:; ° ° °Returning your call. °

## 2016-06-26 NOTE — Telephone Encounter (Signed)
Returned call to patient's wife.She stated husband has severe acid reflux and generic nexium not helping.Stated he has been taking generic nexium 40 mg 2 to 3 times a day.Stated his PCP will not change since Dr.Klein prescribed.Advised I will send message to Dr.Klein for advice.

## 2016-06-26 NOTE — Telephone Encounter (Signed)
New message    Pt c/o medication issue:  1. Name of Medication: esomeprazole 40 mg  2. How are you currently taking this medication (dosage and times per day)? 1 capsule every day  3. Are you having a reaction (difficulty breathing--STAT)?   4. What is your medication issue? Pt wife is calling stating that pt medication is not working. She says pt has to take 2-3 pills a day for it to work. She is calling to ask what to do.

## 2016-06-27 NOTE — Telephone Encounter (Signed)
Follow up    Pt wife is calling back about pt medication.

## 2016-06-27 NOTE — Telephone Encounter (Signed)
New Message   Billy [name] verbalized that she is returning call for rn for pt medication prescription

## 2016-06-29 ENCOUNTER — Telehealth: Payer: Self-pay | Admitting: Family

## 2016-06-29 MED ORDER — ESOMEPRAZOLE MAGNESIUM 40 MG PO CPDR: 40.0000 mg | DELAYED_RELEASE_CAPSULE | Freq: Every day | ORAL | 0 refills | Status: DC

## 2016-06-29 NOTE — Telephone Encounter (Signed)
Per Dr. Caryl Comes, he spoke with Mrs. Satcher on 3/26 and advised he would call and speak with the patient's PCP.

## 2016-06-29 NOTE — Telephone Encounter (Signed)
Pt has an appointment on Monday with [facility] for bad heart burn. Pts wife stated that he was taking up to 3 nexium 40 mg a day. Advised to only take 1 tablet per day no more than that. Per [facility] I advised that if pt gets worse before appointment to go to the ED. Sent a 30 day supply of nexium and advised that pt take it once a day in the morning and to take ranitidine 150 mg at night. Take tums in between as needed. Advised that these are all OTC medications.

## 2016-06-29 NOTE — Telephone Encounter (Signed)
Pt wife wanting her husbands medication for acid reflux refilled. Does not know the name of the medication. Also wants to know if they can either get the medicine changed or up the dosage. Please advise.

## 2016-06-30 ENCOUNTER — Other Ambulatory Visit: Payer: Self-pay | Admitting: Family

## 2016-07-03 ENCOUNTER — Ambulatory Visit (INDEPENDENT_AMBULATORY_CARE_PROVIDER_SITE_OTHER): Payer: Medicare Other | Admitting: Nurse Practitioner

## 2016-07-03 ENCOUNTER — Encounter: Payer: Self-pay | Admitting: Nurse Practitioner

## 2016-07-03 VITALS — BP 120/78 | HR 57 | Temp 97.7°F | Ht 71.0 in | Wt 219.0 lb

## 2016-07-03 DIAGNOSIS — K2100 Gastro-esophageal reflux disease with esophagitis, without bleeding: Secondary | ICD-10-CM

## 2016-07-03 MED ORDER — ESOMEPRAZOLE MAGNESIUM 40 MG PO CPDR: 40.0000 mg | DELAYED_RELEASE_CAPSULE | Freq: Every day | ORAL | 3 refills | Status: DC

## 2016-07-03 MED ORDER — ONDANSETRON HCL 4 MG PO TABS: 4.0000 mg | ORAL_TABLET | Freq: Three times a day (TID) | ORAL | 1 refills | Status: DC | PRN

## 2016-07-03 MED ORDER — SUCRALFATE 1 G PO TABS: 1.0000 g | ORAL_TABLET | Freq: Three times a day (TID) | ORAL | 3 refills | Status: DC

## 2016-07-03 NOTE — Progress Notes (Signed)
Subjective:  Patient ID: Billy [name], male    DOB: Feb 14, 1966  Age: 51 y.o. MRN: [id number]  CC: Heartburn (stomach cramp,acid reflux. )  Heartburn  He complains of abdominal pain, belching, early satiety, globus sensation, heartburn and nausea. He reports no chest pain, no choking, no coughing, no dysphagia, no hoarse voice, no sore throat, no stridor, no tooth decay, no water brash or no wheezing. This is a chronic problem. The current episode started more than 1 year ago. The problem occurs constantly. The problem has been waxing and waning. The heartburn is located in the substernum. The heartburn does not wake him from sleep. The heartburn does not limit his activity. The heartburn doesn't change with position. The symptoms are aggravated by certain foods (empty stomach). Pertinent negatives include no anemia, fatigue, melena, orthopnea or weight loss. Risk factors include ETOH use and smoking/tobacco exposure. He has tried a PPI, an antacid, a histamine-2 antagonist and a pro-motility drug for the symptoms. The treatment provided moderate relief. Past procedures include an EGD and H. pylori antibody titer. cholescystectomy.    Outpatient Medications Prior to Visit  Medication Sig Dispense Refill  . acetaminophen (TYLENOL) 500 MG tablet Take 1,000 mg by mouth every 8 (eight) hours as needed (pain).    [name] albuterol (PROVENTIL HFA;VENTOLIN HFA) 108 (90 BASE) MCG/ACT inhaler Inhale 1-2 puffs into the lungs every 6 (six) hours as needed for wheezing or shortness of breath. 1 Inhaler 0  . aspirin 81 MG EC tablet Take 81 mg by mouth every morning.     [name] atorvastatin (LIPITOR) 80 MG tablet Take 1 tablet (80 mg total) by mouth daily at 6 PM. 90 tablet 3  . carvedilol (COREG) 25 MG tablet Take 1 tablet (25 mg total) by mouth 2 (two) times daily. 180 tablet 3  . dicyclomine (BENTYL) 20 MG tablet Take 1 tablet (20 mg total) by mouth 2 (two) times daily. 30 tablet 3  . furosemide (LASIX) 20 MG tablet  TAKE 1 TABLET BY MOUTH EVERY DAY 90 tablet 1  . lisinopril (PRINIVIL,ZESTRIL) 20 MG tablet Take 1 tablet (20 mg total) by mouth every morning. 90 tablet 3  . montelukast (SINGULAIR) 10 MG tablet TAKE 1 TABLET (10 MG TOTAL) BY MOUTH AT BEDTIME. 30 tablet 0  . Multiple Vitamin (MULTIVITAMIN) tablet Take 1 tablet by mouth every morning.     . potassium chloride SA (K-DUR,KLOR-CON) 20 MEQ tablet Take 1 tablet (20 mEq total) by mouth daily. 90 tablet 3  . ciprofloxacin (CIPRO) 500 MG tablet Take 1 tablet (500 mg total) by mouth 2 (two) times daily. 14 tablet 0  . esomeprazole (NEXIUM) 40 MG capsule Take 1 capsule (40 mg total) by mouth daily. 30 capsule 0  . ibuprofen (ADVIL,MOTRIN) 800 MG tablet Take 800 mg by mouth every 8 (eight) hours as needed for headache or cramping.    [name] levofloxacin (LEVAQUIN) 500 MG tablet Take 1 tablet (500 mg total) by mouth daily. 14 tablet 0  . ondansetron (ZOFRAN) 4 MG tablet Take 1 tablet (4 mg total) by mouth every 6 (six) hours. 20 tablet 0  . phenazopyridine (PYRIDIUM) 100 MG tablet Take 1 tablet (100 mg total) by mouth 3 (three) times daily as needed for pain. 10 tablet 0  . sucralfate (CARAFATE) 1 g tablet TAKE 1 TABLET BY MOUTH 3 TIMES A DAY 90 tablet 0  . nitroGLYCERIN (NITROSTAT) 0.4 MG SL tablet Place 1 tablet (0.4 mg total) under the tongue every 5 (five) minutes  as needed for chest pain. 25 tablet 3   No facility-administered medications prior to visit.     ROS See HPI  Objective:  BP 120/78   Pulse (!) 57   Temp 97.7 F (36.5 C)   Ht 5\' 11"  (1.803 m)   Wt 219 lb (99.3 kg)   SpO2 98%   BMI 30.54 kg/m   BP Readings from Last 3 Encounters:  07/03/16 120/78  06/06/16 124/78  02/07/16 116/78    Wt Readings from Last 3 Encounters:  07/03/16 219 lb (99.3 kg)  06/06/16 220 lb (99.8 kg)  02/07/16 222 lb (100.7 kg)    Physical Exam  Constitutional: He is oriented to person, place, and time. No distress.  Cardiovascular: Normal rate, regular  rhythm and normal heart sounds.   Pulmonary/Chest: Effort normal and breath sounds normal.  Abdominal: Soft. Bowel sounds are normal. He exhibits no distension. There is no tenderness.  Neurological: He is alert and oriented to person, place, and time.  Vitals reviewed.  CBC    Component Value Date/Time   WBC 9.8 01/10/2016 1052   RBC 5.58 01/10/2016 1052   HGB 16.0 01/10/2016 1052   HCT 47.3 01/10/2016 1052   PLT 237.0 01/10/2016 1052   MCV 84.9 01/10/2016 1052   MCH 29.4 10/20/2015 1105   MCHC 33.9 01/10/2016 1052   RDW 14.8 01/10/2016 1052   LYMPHSABS 1.7 10/20/2015 1105   MONOABS 2.2 (H) 10/20/2015 1105   EOSABS 0.2 10/20/2015 1105   BASOSABS 0.0 10/20/2015 1105    CMP     Component Value Date/Time   NA 140 01/10/2016 1052   K 4.0 01/10/2016 1052   CL 107 01/10/2016 1052   CO2 26 01/10/2016 1052   GLUCOSE 98 01/10/2016 1052   BUN 11 01/10/2016 1052   CREATININE 0.99 01/10/2016 1052   CREATININE 1.09 01/07/2015 0942   CALCIUM 9.6 01/10/2016 1052   PROT 7.3 01/10/2016 1052   ALBUMIN 4.0 01/10/2016 1052   AST 23 01/10/2016 1052   ALT 27 01/10/2016 1052   ALKPHOS 88 01/10/2016 1052   BILITOT 0.4 01/10/2016 1052   GFRNONAA >60 10/20/2015 1105   GFRNONAA 84 12/10/2013 1548   GFRAA >60 10/20/2015 1105   GFRAA >89 12/10/2013 1548     Assessment & Plan:   Billy [name] was seen today for heartburn.  Diagnoses and all orders for this visit:  Gastroesophageal reflux disease with esophagitis -     esomeprazole (NEXIUM) 40 MG capsule; Take 1 capsule (40 mg total) by mouth daily. -     sucralfate (CARAFATE) 1 g tablet; Take 1 tablet (1 g total) by mouth 4 (four) times daily -  with meals and at bedtime. -     ondansetron (ZOFRAN) 4 MG tablet; Take 1 tablet (4 mg total) by mouth every 8 (eight) hours as needed for nausea or vomiting.   I have discontinued Billy [name]'s ibuprofen, levofloxacin, ciprofloxacin, and phenazopyridine. I have also changed his sucralfate and  ondansetron. Additionally, I am having him maintain his aspirin, multivitamin, albuterol, acetaminophen, atorvastatin, carvedilol, lisinopril, potassium chloride SA, nitroGLYCERIN, furosemide, dicyclomine, montelukast, and esomeprazole.  Meds ordered this encounter  Medications  . esomeprazole (NEXIUM) 40 MG capsule    Sig: Take 1 capsule (40 mg total) by mouth daily.    Dispense:  30 capsule    Refill:  3    Order Specific Question:   Supervising Provider    Answer:   Cassandria Anger [[id number]]  . sucralfate (CARAFATE) 1  g tablet    Sig: Take 1 tablet (1 g total) by mouth 4 (four) times daily -  with meals and at bedtime.    Dispense:  120 tablet    Refill:  3    Order Specific Question:   Supervising Provider    Answer:   Cassandria Anger [[id number]]  . ondansetron (ZOFRAN) 4 MG tablet    Sig: Take 1 tablet (4 mg total) by mouth every 8 (eight) hours as needed for nausea or vomiting.    Dispense:  30 tablet    Refill:  1    Order Specific Question:   Supervising Provider    Answer:   Cassandria Anger [[id number]]    Follow-up: Return if symptoms worsen or fail to improve.  Wilfred Lacy, NP

## 2016-07-03 NOTE — Patient Instructions (Addendum)
Please follow up with GI.  Take nexium 42mins prior to breakfast. Take sucralfate with each meal.  Encourage small frequent meals Encourage adequate oral hydration with water mostly  Try FDguard 1 tab once a day as well.  Keep food diary to identify trigger foods  Food Choices for Gastroesophageal Reflux Disease, Adult When you have gastroesophageal reflux disease (GERD), the foods you eat and your eating habits are very important. Choosing the right foods can help ease the discomfort of GERD. Consider working with a diet and nutrition specialist (dietitian) to help you make healthy food choices. What general guidelines should I follow? Eating plan   Choose healthy foods low in fat, such as fruits, vegetables, whole grains, low-fat dairy products, and lean meat, fish, and poultry.  Eat frequent, small meals instead of three large meals each day. Eat your meals slowly, in a relaxed setting. Avoid bending over or lying down until 2-3 hours after eating.  Limit high-fat foods such as fatty meats or fried foods.  Limit your intake of oils, butter, and shortening to less than 8 teaspoons each day.  Avoid the following:  Foods that cause symptoms. These may be different for different people. Keep a food diary to keep track of foods that cause symptoms.  Alcohol.  Drinking large amounts of liquid with meals.  Eating meals during the 2-3 hours before bed.  Cook foods using methods other than frying. This may include baking, grilling, or broiling. Lifestyle    Maintain a healthy weight. Ask your health care provider what weight is healthy for you. If you need to lose weight, work with your health care provider to do so safely.  Exercise for at least 30 minutes on 5 or more days each week, or as told by your health care provider.  Avoid wearing clothes that fit tightly around your waist and chest.  Do not use any products that contain nicotine or tobacco, such as cigarettes and  e-cigarettes. If you need help quitting, ask your health care provider.  Sleep with the head of your bed raised. Use a wedge under the mattress or blocks under the bed frame to raise the head of the bed. What foods are not recommended? The items listed may not be a complete list. Talk with your dietitian about what dietary choices are best for you. Grains  Pastries or quick breads with added fat. [place] toast. Vegetables  Deep fried vegetables. [place] fries. Any vegetables prepared with added fat. Any vegetables that cause symptoms. For some people this may include tomatoes and tomato products, chili peppers, onions and garlic, and horseradish. Fruits  Any fruits prepared with added fat. Any fruits that cause symptoms. For some people this may include citrus fruits, such as oranges, grapefruit, pineapple, and lemons. Meats and other protein foods  High-fat meats, such as fatty beef or pork, hot dogs, ribs, ham, sausage, salami and bacon. Fried meat or protein, including fried fish and fried chicken. Nuts and nut butters. Dairy  Whole milk and chocolate milk. Sour cream. Cream. Ice cream. Cream cheese. Milk shakes. Beverages  Coffee and tea, with or without caffeine. Carbonated beverages. Sodas. Energy drinks. Fruit juice made with acidic fruits (such as orange or grapefruit). Tomato juice. Alcoholic drinks. Fats and oils  Butter. Margarine. Shortening. Ghee. Sweets and desserts  Chocolate and cocoa. Donuts. Seasoning and other foods  Pepper. Peppermint and spearmint. Any condiments, herbs, or seasonings that cause symptoms. For some people, this may include curry, hot sauce, or vinegar-based  salad dressings. Summary  When you have gastroesophageal reflux disease (GERD), food and lifestyle choices are very important to help ease the discomfort of GERD.  Eat frequent, small meals instead of three large meals each day. Eat your meals slowly, in a relaxed setting. Avoid bending over or lying  down until 2-3 hours after eating.  Limit high-fat foods such as fatty meat or fried foods. This information is not intended to replace advice given to you by your health care provider. Make sure you discuss any questions you have with your health care provider. Document Released: 03/20/2005 Document Revised: 03/21/2016 Document Reviewed: 03/21/2016 Elsevier Interactive Patient Education  2017 [employer].

## 2016-07-03 NOTE — Progress Notes (Signed)
Pre visit review using our clinic review tool, if applicable. No additional management support is needed unless otherwise documented below in the visit note. 

## 2016-07-04 NOTE — Telephone Encounter (Signed)
Left message with PC provider to clarify best care plan for pts PPI

## 2016-07-31 ENCOUNTER — Other Ambulatory Visit: Payer: Self-pay | Admitting: Family

## 2016-08-30 ENCOUNTER — Ambulatory Visit (INDEPENDENT_AMBULATORY_CARE_PROVIDER_SITE_OTHER): Payer: Medicare Other | Admitting: *Deleted

## 2016-08-30 DIAGNOSIS — I255 Ischemic cardiomyopathy: Secondary | ICD-10-CM | POA: Diagnosis not present

## 2016-09-01 NOTE — Progress Notes (Signed)
Remote ICD transmission.   

## 2016-09-08 ENCOUNTER — Encounter: Payer: Self-pay | Admitting: Cardiology

## 2016-09-12 LAB — CUP PACEART REMOTE DEVICE CHECK
Battery Remaining Percentage: 42 %
Battery Voltage: 2.92 V
Date Time Interrogation Session: 20180530084643
HIGH POWER IMPEDANCE MEASURED VALUE: 55 Ohm
Implantable Lead Location: 753860
Lead Channel Pacing Threshold Amplitude: 0.75 V
Lead Channel Sensing Intrinsic Amplitude: 11.7 mV
Lead Channel Setting Pacing Amplitude: 2.5 V
Lead Channel Setting Sensing Sensitivity: 0.5 mV
MDC IDC LEAD IMPLANT DT: 20101111
MDC IDC MSMT BATTERY REMAINING LONGEVITY: 49 mo
MDC IDC MSMT LEADCHNL RV IMPEDANCE VALUE: 480 Ohm
MDC IDC MSMT LEADCHNL RV PACING THRESHOLD PULSEWIDTH: 0.5 ms
MDC IDC PG IMPLANT DT: 20101111
MDC IDC SET LEADCHNL RV PACING PULSEWIDTH: 0.5 ms
MDC IDC STAT BRADY RV PERCENT PACED: 1 %
Pulse Gen Serial Number: 743367

## 2016-10-21 ENCOUNTER — Other Ambulatory Visit: Payer: Self-pay | Admitting: Internal Medicine

## 2016-10-23 NOTE — Telephone Encounter (Signed)
This is Dr. Crenshaw's pt 

## 2016-11-01 ENCOUNTER — Telehealth: Payer: Self-pay | Admitting: Internal Medicine

## 2016-11-01 ENCOUNTER — Telehealth: Payer: Self-pay | Admitting: Cardiology

## 2016-11-01 NOTE — Telephone Encounter (Signed)
LMOVM requesting that pt send manual transmission b/c home monitor has not updated in at least 7 days.    

## 2016-11-01 NOTE — Telephone Encounter (Signed)
Monitor not responding when pressing white button. I instructed him to press "reset" button on back of monitor and hold white button at the same time x 5 seconds. Monitor lights scrolling as usual when sending transmission. Pt aware to sit near monitor until stars light up.

## 2016-11-01 NOTE — Telephone Encounter (Signed)
New message    Pt is calling about sending a manuel transmissions please call.

## 2016-11-24 ENCOUNTER — Other Ambulatory Visit: Payer: Self-pay | Admitting: Internal Medicine

## 2016-11-29 ENCOUNTER — Ambulatory Visit (INDEPENDENT_AMBULATORY_CARE_PROVIDER_SITE_OTHER): Payer: Medicare Other | Admitting: *Deleted

## 2016-11-29 DIAGNOSIS — I255 Ischemic cardiomyopathy: Secondary | ICD-10-CM

## 2016-11-30 NOTE — Progress Notes (Signed)
Remote ICD transmission.   

## 2016-12-12 ENCOUNTER — Encounter: Payer: Self-pay | Admitting: Cardiology

## 2016-12-20 ENCOUNTER — Other Ambulatory Visit: Payer: Self-pay | Admitting: Internal Medicine

## 2016-12-20 LAB — CUP PACEART REMOTE DEVICE CHECK
Battery Voltage: 2.92 V
Brady Statistic RV Percent Paced: 1 %
HIGH POWER IMPEDANCE MEASURED VALUE: 54 Ohm
Implantable Lead Implant Date: 20101111
Implantable Lead Model: 7121
Implantable Pulse Generator Implant Date: 20101111
Lead Channel Impedance Value: 490 Ohm
Lead Channel Pacing Threshold Amplitude: 0.75 V
Lead Channel Pacing Threshold Pulse Width: 0.5 ms
Lead Channel Sensing Intrinsic Amplitude: 12 mV
MDC IDC LEAD LOCATION: 753860
MDC IDC MSMT BATTERY REMAINING LONGEVITY: 47 mo
MDC IDC MSMT BATTERY REMAINING PERCENTAGE: 39 %
MDC IDC SESS DTM: 20180829080009
MDC IDC SET LEADCHNL RV PACING AMPLITUDE: 2.5 V
MDC IDC SET LEADCHNL RV PACING PULSEWIDTH: 0.5 ms
MDC IDC SET LEADCHNL RV SENSING SENSITIVITY: 0.5 mV
Pulse Gen Serial Number: 743367

## 2016-12-20 NOTE — Telephone Encounter (Signed)
This is Dr. Crenshaw's pt 

## 2017-01-22 ENCOUNTER — Telehealth: Payer: Self-pay | Admitting: Family

## 2017-01-22 DIAGNOSIS — K2100 Gastro-esophageal reflux disease with esophagitis, without bleeding: Secondary | ICD-10-CM

## 2017-01-22 MED ORDER — ESOMEPRAZOLE MAGNESIUM 40 MG PO CPDR: 40.0000 mg | DELAYED_RELEASE_CAPSULE | Freq: Every day | ORAL | 0 refills | Status: DC

## 2017-01-22 NOTE — Telephone Encounter (Signed)
Per office policy sent 30 day to local pharmacy until appt.../lmb  

## 2017-01-22 NOTE — Telephone Encounter (Signed)
Pt wife called for a refill of his esomeprazole (NEXIUM) 40 MG capsule Transferring to [facility] on 11/20 Please advise  POF

## 2017-02-02 ENCOUNTER — Other Ambulatory Visit: Payer: Self-pay | Admitting: *Deleted

## 2017-02-02 MED ORDER — MONTELUKAST SODIUM 10 MG PO TABS: 10.0000 mg | ORAL_TABLET | Freq: Every day | ORAL | 5 refills | Status: DC

## 2017-02-20 ENCOUNTER — Ambulatory Visit: Payer: Medicare Other | Admitting: Internal Medicine

## 2017-02-20 ENCOUNTER — Other Ambulatory Visit: Payer: Self-pay | Admitting: Internal Medicine

## 2017-02-20 DIAGNOSIS — K2100 Gastro-esophageal reflux disease with esophagitis, without bleeding: Secondary | ICD-10-CM

## 2017-02-21 ENCOUNTER — Other Ambulatory Visit: Payer: Self-pay | Admitting: Internal Medicine

## 2017-02-21 DIAGNOSIS — K2100 Gastro-esophageal reflux disease with esophagitis, without bleeding: Secondary | ICD-10-CM

## 2017-02-25 ENCOUNTER — Other Ambulatory Visit: Payer: Self-pay | Admitting: Internal Medicine

## 2017-02-28 ENCOUNTER — Ambulatory Visit (INDEPENDENT_AMBULATORY_CARE_PROVIDER_SITE_OTHER): Payer: Medicare Other | Admitting: *Deleted

## 2017-02-28 DIAGNOSIS — I255 Ischemic cardiomyopathy: Secondary | ICD-10-CM | POA: Diagnosis not present

## 2017-02-28 NOTE — Progress Notes (Signed)
Remote ICD transmission.   

## 2017-03-02 ENCOUNTER — Encounter: Payer: Self-pay | Admitting: Cardiology

## 2017-03-03 ENCOUNTER — Telehealth: Payer: Self-pay | Admitting: Physician Assistant

## 2017-03-03 ENCOUNTER — Other Ambulatory Visit: Payer: Self-pay | Admitting: Internal Medicine

## 2017-03-03 NOTE — Telephone Encounter (Signed)
I called in refills for coreg, nexium, and lisinopril.

## 2017-03-13 LAB — CUP PACEART REMOTE DEVICE CHECK
Battery Remaining Longevity: 44 mo
Battery Remaining Percentage: 37 %
Battery Voltage: 2.92 V
Brady Statistic RV Percent Paced: 1 %
HIGH POWER IMPEDANCE MEASURED VALUE: 49 Ohm
Implantable Lead Model: 7121
Lead Channel Impedance Value: 460 Ohm
Lead Channel Pacing Threshold Pulse Width: 0.5 ms
Lead Channel Setting Pacing Amplitude: 2.5 V
Lead Channel Setting Pacing Pulse Width: 0.5 ms
MDC IDC LEAD IMPLANT DT: 20101111
MDC IDC LEAD LOCATION: 753860
MDC IDC MSMT LEADCHNL RV PACING THRESHOLD AMPLITUDE: 0.75 V
MDC IDC MSMT LEADCHNL RV SENSING INTR AMPL: 12 mV
MDC IDC PG IMPLANT DT: 20101111
MDC IDC PG SERIAL: 743367
MDC IDC SESS DTM: 20181128115207
MDC IDC SET LEADCHNL RV SENSING SENSITIVITY: 0.5 mV

## 2017-04-05 ENCOUNTER — Other Ambulatory Visit: Payer: Self-pay

## 2017-04-05 MED ORDER — ATORVASTATIN CALCIUM 80 MG PO TABS: 80.0000 mg | ORAL_TABLET | Freq: Every day | ORAL | 0 refills | Status: DC

## 2017-04-13 ENCOUNTER — Encounter: Payer: Self-pay | Admitting: *Deleted

## 2017-04-19 ENCOUNTER — Encounter: Payer: Self-pay | Admitting: Family Medicine

## 2017-04-19 ENCOUNTER — Ambulatory Visit: Payer: Medicare Other | Attending: Family Medicine | Admitting: Family Medicine

## 2017-04-19 VITALS — BP 150/90 | HR 53 | Temp 97.2°F | Ht 71.0 in | Wt 219.0 lb

## 2017-04-19 DIAGNOSIS — I255 Ischemic cardiomyopathy: Secondary | ICD-10-CM | POA: Diagnosis not present

## 2017-04-19 DIAGNOSIS — I110 Hypertensive heart disease with heart failure: Secondary | ICD-10-CM | POA: Diagnosis not present

## 2017-04-19 DIAGNOSIS — I252 Old myocardial infarction: Secondary | ICD-10-CM | POA: Insufficient documentation

## 2017-04-19 DIAGNOSIS — Z8674 Personal history of sudden cardiac arrest: Secondary | ICD-10-CM | POA: Diagnosis not present

## 2017-04-19 DIAGNOSIS — R0602 Shortness of breath: Secondary | ICD-10-CM | POA: Insufficient documentation

## 2017-04-19 DIAGNOSIS — I2510 Atherosclerotic heart disease of native coronary artery without angina pectoris: Secondary | ICD-10-CM | POA: Diagnosis not present

## 2017-04-19 DIAGNOSIS — Z79899 Other long term (current) drug therapy: Secondary | ICD-10-CM | POA: Diagnosis not present

## 2017-04-19 DIAGNOSIS — I10 Essential (primary) hypertension: Secondary | ICD-10-CM | POA: Diagnosis not present

## 2017-04-19 DIAGNOSIS — I739 Peripheral vascular disease, unspecified: Secondary | ICD-10-CM | POA: Insufficient documentation

## 2017-04-19 DIAGNOSIS — F1210 Cannabis abuse, uncomplicated: Secondary | ICD-10-CM | POA: Insufficient documentation

## 2017-04-19 DIAGNOSIS — K2100 Gastro-esophageal reflux disease with esophagitis, without bleeding: Secondary | ICD-10-CM

## 2017-04-19 DIAGNOSIS — Z7982 Long term (current) use of aspirin: Secondary | ICD-10-CM | POA: Diagnosis not present

## 2017-04-19 DIAGNOSIS — Z951 Presence of aortocoronary bypass graft: Secondary | ICD-10-CM | POA: Insufficient documentation

## 2017-04-19 DIAGNOSIS — D170 Benign lipomatous neoplasm of skin and subcutaneous tissue of head, face and neck: Secondary | ICD-10-CM | POA: Insufficient documentation

## 2017-04-19 DIAGNOSIS — E785 Hyperlipidemia, unspecified: Secondary | ICD-10-CM | POA: Insufficient documentation

## 2017-04-19 DIAGNOSIS — R7303 Prediabetes: Secondary | ICD-10-CM | POA: Diagnosis not present

## 2017-04-19 DIAGNOSIS — J302 Other seasonal allergic rhinitis: Secondary | ICD-10-CM | POA: Insufficient documentation

## 2017-04-19 DIAGNOSIS — Z95810 Presence of automatic (implantable) cardiac defibrillator: Secondary | ICD-10-CM | POA: Diagnosis not present

## 2017-04-19 DIAGNOSIS — I509 Heart failure, unspecified: Secondary | ICD-10-CM | POA: Diagnosis not present

## 2017-04-19 DIAGNOSIS — K219 Gastro-esophageal reflux disease without esophagitis: Secondary | ICD-10-CM | POA: Diagnosis present

## 2017-04-19 LAB — POCT GLYCOSYLATED HEMOGLOBIN (HGB A1C): Hemoglobin A1C: 5.8

## 2017-04-19 MED ORDER — POTASSIUM CHLORIDE CRYS ER 20 MEQ PO TBCR: 20.0000 meq | EXTENDED_RELEASE_TABLET | Freq: Every day | ORAL | 6 refills | Status: DC

## 2017-04-19 MED ORDER — ATORVASTATIN CALCIUM 80 MG PO TABS: 80.0000 mg | ORAL_TABLET | Freq: Every day | ORAL | 6 refills | Status: DC

## 2017-04-19 MED ORDER — LISINOPRIL 20 MG PO TABS: 20.0000 mg | ORAL_TABLET | Freq: Every morning | ORAL | 6 refills | Status: DC

## 2017-04-19 MED ORDER — ESOMEPRAZOLE MAGNESIUM 40 MG PO CPDR: 40.0000 mg | DELAYED_RELEASE_CAPSULE | Freq: Every day | ORAL | 6 refills | Status: DC

## 2017-04-19 MED ORDER — CARVEDILOL 25 MG PO TABS: 25.0000 mg | ORAL_TABLET | Freq: Two times a day (BID) | ORAL | 6 refills | Status: DC

## 2017-04-19 MED ORDER — DICYCLOMINE HCL 20 MG PO TABS: 20.0000 mg | ORAL_TABLET | Freq: Two times a day (BID) | ORAL | 3 refills | Status: DC

## 2017-04-19 MED ORDER — ONDANSETRON HCL 4 MG PO TABS: 4.0000 mg | ORAL_TABLET | Freq: Three times a day (TID) | ORAL | 1 refills | Status: DC | PRN

## 2017-04-19 NOTE — Patient Instructions (Signed)
Prediabetes Eating Plan Prediabetes-also called impaired glucose tolerance or impaired fasting glucose-is a condition that causes blood sugar (blood glucose) levels to be higher than normal. Following a healthy diet can help to keep prediabetes under control. It can also help to lower the risk of type 2 diabetes and heart disease, which are increased in people who have prediabetes. Along with regular exercise, a healthy diet:  Promotes weight loss.  Helps to control blood sugar levels.  Helps to improve the way that the body uses insulin.  What do I need to know about this eating plan?  Use the glycemic index (GI) to plan your meals. The index tells you how quickly a food will raise your blood sugar. Choose low-GI foods. These foods take a longer time to raise blood sugar.  Pay close attention to the amount of carbohydrates in the food that you eat. Carbohydrates increase blood sugar levels.  Keep track of how many calories you take in. Eating the right amount of calories will help you to achieve a healthy weight. Losing about 7 percent of your starting weight can help to prevent type 2 diabetes.  You may want to follow a Mediterranean diet. This diet includes a lot of vegetables, lean meats or fish, whole grains, fruits, and healthy oils and fats. What foods can I eat? Grains Whole grains, such as whole-wheat or whole-grain breads, crackers, cereals, and pasta. Unsweetened oatmeal. Bulgur. Barley. Quinoa. Brown rice. Corn or whole-wheat flour tortillas or taco shells. Vegetables Lettuce. Spinach. Peas. Beets. Cauliflower. Cabbage. Broccoli. Carrots. Tomatoes. Squash. Eggplant. Herbs. Peppers. Onions. Cucumbers. Brussels sprouts. Fruits Berries. Bananas. Apples. Oranges. Grapes. Papaya. Mango. Pomegranate. Kiwi. Grapefruit. Cherries. Meats and Other Protein Sources Seafood. Lean meats, such as chicken and turkey or lean cuts of pork and beef. Tofu. Eggs. Nuts. Beans. Dairy Low-fat or  fat-free dairy products, such as yogurt, cottage cheese, and cheese. Beverages Water. Tea. Coffee. Sugar-free or diet soda. Seltzer water. Milk. Milk alternatives, such as soy or almond milk. Condiments Mustard. Relish. Low-fat, low-sugar ketchup. Low-fat, low-sugar barbecue sauce. Low-fat or fat-free mayonnaise. Sweets and Desserts Sugar-free or low-fat pudding. Sugar-free or low-fat ice cream and other frozen treats. Fats and Oils Avocado. Walnuts. Olive oil. The items listed above may not be a complete list of recommended foods or beverages. Contact your dietitian for more options. What foods are not recommended? Grains Refined white flour and flour products, such as bread, pasta, snack foods, and cereals. Beverages Sweetened drinks, such as sweet iced tea and soda. Sweets and Desserts Baked goods, such as cake, cupcakes, pastries, cookies, and cheesecake. The items listed above may not be a complete list of foods and beverages to avoid. Contact your dietitian for more information. This information is not intended to replace advice given to you by your health care provider. Make sure you discuss any questions you have with your health care provider. Document Released: 08/04/2014 Document Revised: 08/26/2015 Document Reviewed: 04/15/2014 Elsevier Interactive Patient Education  2017 Elsevier Inc.  

## 2017-04-19 NOTE — Progress Notes (Signed)
Subjective:  Patient ID: Billy [name], male    DOB: 11-19-1965  Age: 52 y.o. MRN: [id number]  CC: Hypertension and Establish Care   HPI Billy [name] is a 52 year old male with a history of hypertension, ischemic cardiomyopathy status post quadruple bypass, history of V. fib status post [employer] single lead ICD in 2011, GERD who was previously followed by [facility] primary care and presents today to establish care with me.  His blood pressure is slightly elevated and he endorses compliance with his medications but was informed he would not receive any more refills from his cardiologist until he scheduled an appointment. He denies chest pains, shortness of breath, pedal edema and has an upcoming appointment with cardiology later this month.  He is concerned about abdominal cramps which have been intermittent causing him to bend over  breaking out in sweats with associated nausea but no vomiting. Denies diarrhea or constipation. He has been out of his Nexium for the last 1 week but did take some of his wife's. Currently on Carafate which does not help; takes dicyclomine intermittently with minimal relief.  He is also concerned about a swelling in the right forehead which she has had for close to 7 years.  This is described as not tender and has slightly reduced in size and he thinks he noticed it after his cardiac arrest in 2011. He would like to have this removed.  Past Medical History:  Diagnosis Date  . Blood in stool   . Cardiac arrest - ventricular fibrillation 02/11/2009   a. s/p ICD  . CHF (congestive heart failure) ([place])   . Coronary artery disease    s/p CABG  . Hyperlipidemia   . Hypertension   . Ischemic cardiomyopathy   . Marijuana abuse   . Myocardial infarct ([name])    NON-ST-SEGMENT ELEVATION MI  . Peripheral vascular disease, unspecified ([place])     Past Surgical History:  Procedure Laterality Date  . CARDIAC DEFIBRILLATOR PLACEMENT     STJ single chamber ICD  implanted for secondary prevention  . CIRCUMCISION    . CORONARY ARTERY BYPASS GRAFT  06/17/01   X 4    No Known Allergies   Outpatient Medications Prior to Visit  Medication Sig Dispense Refill  . acetaminophen (TYLENOL) 500 MG tablet Take 1,000 mg by mouth every 8 (eight) hours as needed (pain).    [name] albuterol (PROVENTIL HFA;VENTOLIN HFA) 108 (90 BASE) MCG/ACT inhaler Inhale 1-2 puffs into the lungs every 6 (six) hours as needed for wheezing or shortness of breath. 1 Inhaler 0  . aspirin 81 MG EC tablet Take 81 mg by mouth every morning.     . furosemide (LASIX) 20 MG tablet TAKE 1 TABLET BY MOUTH EVERY DAY 90 tablet 1  . montelukast (SINGULAIR) 10 MG tablet Take 1 tablet (10 mg total) by mouth at bedtime. Must keep Nov appt w/new provider for future refills 30 tablet 5  . Multiple Vitamin (MULTIVITAMIN) tablet Take 1 tablet by mouth every morning.     . sucralfate (CARAFATE) 1 g tablet Take 1 tablet (1 g total) by mouth 4 (four) times daily -  with meals and at bedtime. 120 tablet 3  . atorvastatin (LIPITOR) 80 MG tablet Take 1 tablet (80 mg total) by mouth daily. No more refills will be provided until pt has yearly appointment, Final. Thank you! [phone] 15 tablet 0  . carvedilol (COREG) 25 MG tablet Take 1 tablet (25 mg total) by mouth 2 (two) times  daily. 180 tablet 3  . dicyclomine (BENTYL) 20 MG tablet Take 1 tablet (20 mg total) by mouth 2 (two) times daily. 30 tablet 3  . KLOR-CON M20 20 MEQ tablet TAKE 1 TABLET EVERY DAY 90 tablet 0  . lisinopril (PRINIVIL,ZESTRIL) 20 MG tablet TAKE 1 TABLET (20 MG TOTAL) BY MOUTH EVERY MORNING. 90 tablet 0  . ondansetron (ZOFRAN) 4 MG tablet Take 1 tablet (4 mg total) by mouth every 8 (eight) hours as needed for nausea or vomiting. 30 tablet 1  . nitroGLYCERIN (NITROSTAT) 0.4 MG SL tablet Place 1 tablet (0.4 mg total) under the tongue every 5 (five) minutes as needed for chest pain. 25 tablet 3  . esomeprazole (NEXIUM) 40 MG capsule Take 1  capsule (40 mg total) by mouth daily. Must keep appt w/new provider for future refills (Patient not taking: Reported on 04/19/2017) 30 capsule 0   No facility-administered medications prior to visit.     ROS Review of Systems  Constitutional: Negative for activity change and appetite change.  HENT: Negative for sinus pressure and sore throat.   Eyes: Negative for visual disturbance.  Respiratory: Negative for cough, chest tightness and shortness of breath.   Cardiovascular: Negative for chest pain and leg swelling.  Gastrointestinal: Positive for abdominal pain. Negative for abdominal distention, constipation and diarrhea.  Endocrine: Negative.   Genitourinary: Negative for dysuria.  Musculoskeletal: Negative for joint swelling and myalgias.  Skin: Negative for rash.  Allergic/Immunologic: Negative.   Neurological: Negative for weakness, light-headedness and numbness.  Psychiatric/Behavioral: Negative for dysphoric mood and suicidal ideas.    Objective:  BP (!) 150/90   Pulse (!) 53   Temp (!) 97.2 F (36.2 C) (Oral)   Ht '5\' 11"'  (1.803 m)   Wt 219 lb (99.3 kg)   SpO2 98%   BMI 30.54 kg/m   BP/Weight 04/19/2017 10/07/9388 3/0/0923  Systolic BP 300 762 263  Diastolic BP 90 78 78  Wt. (Lbs) 219 219 220  BMI 30.54 30.54 31.57      Physical Exam  Constitutional: He is oriented to person, place, and time. He appears well-developed and well-nourished.  Cardiovascular: Normal rate, normal heart sounds and intact distal pulses.  No murmur heard. Pulmonary/Chest: Effort normal and breath sounds normal. He has no wheezes. He has no rales. He exhibits no tenderness.  Healed vertical sternotomy scar ICD in left upper chest wall  Abdominal: Soft. Bowel sounds are normal. He exhibits no distension and no mass. There is no tenderness.  Musculoskeletal: Normal range of motion.  Neurological: He is alert and oriented to person, place, and time.  Skin: Skin is warm and dry.    Psychiatric: He has a normal mood and affect.     Assessment & Plan:   1. Gastroesophageal reflux disease with esophagitis He does have symptoms of GERD with possible underlying IBS We will also exclude Helicobacter pylori gastritis due to persisting symptoms -he has been off PPI for just 1 week and so this test has been ordered against next week by which time he would have been 2 weeks without a PPI to prevent the likelihood of a false negative Will refer to GI for endoscopy if uncontrolled - ondansetron (ZOFRAN) 4 MG tablet; Take 1 tablet (4 mg total) by mouth every 8 (eight) hours as needed for nausea or vomiting.  Dispense: 30 tablet; Refill: 1 - esomeprazole (NEXIUM) 40 MG capsule; Take 1 capsule (40 mg total) by mouth daily.  Dispense: 30 capsule; Refill: 6 -  H. pylori breath test; Future  2. Seasonal allergies Takes Singulair for this  3. Essential hypertension Uncontrolled Counseled on blood pressure goal of less than 130/80, low-sodium, DASH diet, medication compliance, 150 minutes of moderate intensity exercise per week. Discussed medication compliance, adverse effects. We will adjust regimen at next visit if blood pressure remains elevated - CMP14+EGFR - Lipid panel - lisinopril (PRINIVIL,ZESTRIL) 20 MG tablet; Take 1 tablet (20 mg total) by mouth every morning.  Dispense: 30 tablet; Refill: 6 - carvedilol (COREG) 25 MG tablet; Take 1 tablet (25 mg total) by mouth 2 (two) times daily.  Dispense: 60 tablet; Refill: 6  4. Implantable cardioverter-defibrillator (ICD) in situ Status post device check on 02/28/17 Advised to keep appointment with his electrophysiology cardiologist  5. S/P quadruple vessel bypass Risk factor modification - atorvastatin (LIPITOR) 80 MG tablet; Take 1 tablet (80 mg total) by mouth daily.  Dispense: 30 tablet; Refill: 6  6. Lipoma of head Patient would like this to be evaluated by the surgeon for excision - Ambulatory referral to General  Surgery  7. Prediabetes A1c 5.8 Discussed lifestyle modifications to prevent development of diabetes   Meds ordered this encounter  Medications  . ondansetron (ZOFRAN) 4 MG tablet    Sig: Take 1 tablet (4 mg total) by mouth every 8 (eight) hours as needed for nausea or vomiting.    Dispense:  30 tablet    Refill:  1  . lisinopril (PRINIVIL,ZESTRIL) 20 MG tablet    Sig: Take 1 tablet (20 mg total) by mouth every morning.    Dispense:  30 tablet    Refill:  6  . potassium chloride SA (KLOR-CON M20) 20 MEQ tablet    Sig: Take 1 tablet (20 mEq total) by mouth daily.    Dispense:  30 tablet    Refill:  6  . esomeprazole (NEXIUM) 40 MG capsule    Sig: Take 1 capsule (40 mg total) by mouth daily.    Dispense:  30 capsule    Refill:  6  . dicyclomine (BENTYL) 20 MG tablet    Sig: Take 1 tablet (20 mg total) by mouth 2 (two) times daily.    Dispense:  30 tablet    Refill:  3  . carvedilol (COREG) 25 MG tablet    Sig: Take 1 tablet (25 mg total) by mouth 2 (two) times daily.    Dispense:  60 tablet    Refill:  6  . atorvastatin (LIPITOR) 80 MG tablet    Sig: Take 1 tablet (80 mg total) by mouth daily.    Dispense:  30 tablet    Refill:  6    Follow-up: Return in about 3 months (around 07/18/2017) for follow up of chronic medical conditions; 1 week nurse visit- H.pylori breath test.   Arnoldo Morale MD

## 2017-04-20 LAB — LIPID PANEL
CHOLESTEROL TOTAL: 141 mg/dL (ref 100–199)
Chol/HDL Ratio: 2.8 ratio (ref 0.0–5.0)
HDL: 50 mg/dL (ref >39)
LDL Calculated: 80 mg/dL (ref 0–99)
Triglycerides: 57 mg/dL (ref 0–149)
VLDL Cholesterol Cal: 11 mg/dL (ref 5–40)

## 2017-04-20 LAB — CMP14+EGFR
A/G RATIO: 1.4 (ref 1.2–2.2)
ALBUMIN: 4.1 g/dL (ref 3.5–5.5)
ALK PHOS: 109 IU/L (ref 39–117)
ALT: 23 IU/L (ref 0–44)
AST: 26 IU/L (ref 0–40)
BILIRUBIN TOTAL: 0.6 mg/dL (ref 0.0–1.2)
BUN / CREAT RATIO: 9 (ref 9–20)
BUN: 9 mg/dL (ref 6–24)
CHLORIDE: 103 mmol/L (ref 96–106)
CO2: 24 mmol/L (ref 20–29)
Calcium: 9.2 mg/dL (ref 8.7–10.2)
Creatinine, Ser: 1.03 mg/dL (ref 0.76–1.27)
GFR calc non Af Amer: 84 mL/min/{1.73_m2} (ref >59)
GFR, EST AFRICAN AMERICAN: 97 mL/min/{1.73_m2} (ref >59)
GLUCOSE: 90 mg/dL (ref 65–99)
Globulin, Total: 3 g/dL (ref 1.5–4.5)
POTASSIUM: 4.2 mmol/L (ref 3.5–5.2)
Sodium: 143 mmol/L (ref 134–144)
TOTAL PROTEIN: 7.1 g/dL (ref 6.0–8.5)

## 2017-04-26 ENCOUNTER — Ambulatory Visit: Payer: Medicare Other | Attending: Family Medicine

## 2017-04-26 DIAGNOSIS — K2100 Gastro-esophageal reflux disease with esophagitis, without bleeding: Secondary | ICD-10-CM

## 2017-04-26 NOTE — Progress Notes (Signed)
Patient here for lab visit only 

## 2017-04-28 LAB — H. PYLORI BREATH TEST: H pylori Breath Test: NEGATIVE

## 2017-04-29 NOTE — Progress Notes (Signed)
Cardiology Office Note Date:  05/02/2017  Patient ID:  Billy [name], DOB 05-02-1965, MRN [id number] PCP:  Charlott Rakes, MD  Cardiologist:  Dr. Stanford Breed Electrophysiologist: Dr. Caryl Comes    Chief Complaint: annual EP visit  History of Present Illness: Billy [name] is a 52 y.o. male with history of VF arrest w/ICD, CAD (remote CABG), HTN, HLD, smoker.  He comes in today to be seen for Dr. Caryl Comes, last seen by him in Aug 2017, no changes were made at that visit.  He is accompanied by his wife today.  He reports feeling well, he has a chronic sounding sternal ache since his CABG, no CP otherwise.  He denies exertional intolerances, walks for exercise, sounds like a couple times a week at least, no DOE.  No dizziness, near syncope or syncope, no palpitations.   Device information: SJM single chamber ICD implanted 02/11/09 + hx of appropriate therapy, VF in 2016   Past Medical History:  Diagnosis Date  . Blood in stool   . Cardiac arrest - ventricular fibrillation 02/11/2009   a. s/p ICD  . CHF (congestive heart failure) ([place])   . Coronary artery disease    s/p CABG  . Hyperlipidemia   . Hypertension   . Ischemic cardiomyopathy   . Marijuana abuse   . Myocardial infarct ([name])    NON-ST-SEGMENT ELEVATION MI  . Peripheral vascular disease, unspecified ([place])     Past Surgical History:  Procedure Laterality Date  . CARDIAC DEFIBRILLATOR PLACEMENT     STJ single chamber ICD implanted for secondary prevention  . CIRCUMCISION    . CORONARY ARTERY BYPASS GRAFT  06/17/01   X 4    Current Outpatient Medications  Medication Sig Dispense Refill  . acetaminophen (TYLENOL) 500 MG tablet Take 1,000 mg by mouth every 8 (eight) hours as needed (pain).    [name] albuterol (PROVENTIL HFA;VENTOLIN HFA) 108 (90 BASE) MCG/ACT inhaler Inhale 1-2 puffs into the lungs every 6 (six) hours as needed for wheezing or shortness of breath. 1 Inhaler 0  . aspirin 81 MG EC tablet Take 81 mg by mouth every  morning.     [name] atorvastatin (LIPITOR) 80 MG tablet Take 1 tablet (80 mg total) by mouth daily. 30 tablet 6  . carvedilol (COREG) 25 MG tablet Take 1 tablet (25 mg total) by mouth 2 (two) times daily. 60 tablet 6  . dicyclomine (BENTYL) 20 MG tablet Take 1 tablet (20 mg total) by mouth 2 (two) times daily. 30 tablet 3  . esomeprazole (NEXIUM) 40 MG capsule Take 1 capsule (40 mg total) by mouth daily. 30 capsule 6  . furosemide (LASIX) 20 MG tablet TAKE 1 TABLET BY MOUTH EVERY DAY 90 tablet 1  . lisinopril (PRINIVIL,ZESTRIL) 20 MG tablet Take 1 tablet (20 mg total) by mouth every morning. 30 tablet 6  . montelukast (SINGULAIR) 10 MG tablet Take 1 tablet (10 mg total) by mouth at bedtime. Must keep Nov appt w/new provider for future refills 30 tablet 5  . Multiple Vitamin (MULTIVITAMIN) tablet Take 1 tablet by mouth every morning.     . ondansetron (ZOFRAN) 4 MG tablet Take 1 tablet (4 mg total) by mouth every 8 (eight) hours as needed for nausea or vomiting. 30 tablet 1  . potassium chloride SA (KLOR-CON M20) 20 MEQ tablet Take 1 tablet (20 mEq total) by mouth daily. 30 tablet 6  . sucralfate (CARAFATE) 1 g tablet Take 1 tablet (1 g total) by mouth 4 (four) times daily -  with meals and at bedtime. 120 tablet 3  . nitroGLYCERIN (NITROSTAT) 0.4 MG SL tablet Place 1 tablet (0.4 mg total) under the tongue every 5 (five) minutes as needed for chest pain. 25 tablet 3   No current facility-administered medications for this visit.     Allergies:   Patient has no known allergies.   Social History:  The patient  reports that he quit smoking about 14 years ago. He has a 40.00 pack-year smoking history. he has never used smokeless tobacco. He reports that he does not drink alcohol or use drugs.   Family History:  The patient's family history includes Heart attack in his father; Heart disease in his sister; Hypertension in his mother and unknown relative.   ROS:  Please see the history of present illness.   All other systems are reviewed and otherwise negative.   PHYSICAL EXAM:  VS:  BP (!) 142/80   Pulse (!) 52   Ht 5\' 11"  (1.803 m)   Wt 225 lb (102.1 kg)   BMI 31.38 kg/m  BMI: Body mass index is 31.38 kg/m. Well nourished, well developed, in no acute distress  HEENT: normocephalic, atraumatic  Neck: no JVD, carotid bruits or masses Cardiac:   RRR; no significant murmurs, no rubs, or gallops Lungs: CTA b/l, no wheezing, rhonchi or rales  Abd: soft, nontender MS: no deformity or atrophy Ext: no edema  Skin: warm and dry, no rash Neuro:  No gross deficits appreciated Psych: euthymic mood, full affect  ICD site is stable, no tethering or discomfort   EKG:  Done today and reviewed by  myself is SB 52bpm, no significant changes from prior ICD interrogation done today and reviewed by myself: Battery and lead measurements are good, no episodes, VP <1%  04/18/12: Lexiscan stress Impression Exercise Capacity:  Lexiscan with no exercise. BP Response:  Hypotensive blood pressure response. Clinical Symptoms:  Chest tightness, nausea, dizziness.  ECG Impression:  No significant ST segment change suggestive of ischemia. Comparison with Prior Nuclear Study: Similar to report of previous study. Overall Impression:  Intermediate stress nuclear study.  Medium-sized, severe partially reversible basal to mid inferolateral and basal inferior perfusion defect suggestive of infarction with peri-infarct ischemia.  LV Ejection Fraction: 38%.  LV Wall Motion:  Inferolateral hypokinesis.    02/09/09: TTE Study Conclusions 1. Left ventricle: The cavity size was mildly dilated. Wall  thickness was normal. Systolic function was mildly to moderately  reduced. The estimated ejection fraction was in the range of 40%  to 45%. There is akinesis of the basal-mid inferoposterior  myocardium. 2. Mitral valve: Mild regurgitation. 3. Left atrium: The atrium was mildly dilated. 4. Right atrium:  The atrium was mildly dilated. Impressions: - Limited study to assess LV function; full doppler study not  performed.  Recent Labs: 04/19/2017: ALT 23; BUN 9; Creatinine, Ser 1.03; Potassium 4.2; Sodium 143  04/19/2017: Chol/HDL Ratio 2.8; Cholesterol, Total 141; HDL 50; LDL Calculated 80; Triglycerides 57   Estimated Creatinine Clearance: 104.4 mL/min (by C-G formula based on SCr of 1.03 mg/dL).   Wt Readings from Last 3 Encounters:  05/02/17 225 lb (102.1 kg)  04/19/17 219 lb (99.3 kg)  07/03/16 219 lb (99.3 kg)     Other studies reviewed: Additional studies/records reviewed today include: summarized above  ASSESSMENT AND PLAN:  1. ICD     Intact function, no changes made  2. ICM, chronic CHF     Cor Vue is below threshold, though no symptoms or exam  findings to suggest fluid OL      Weight is up but wearing heavier clothes as well, ?     On BB/ACE, lasix/K+  Discussed daily weights, diet, minimizing sodium     I will refer to L. Short for ICM clinic follow up  3. CAD     No anginal complaints     On ASA, BB and high dose statin     Needs f/u with Dr. Stanford Breed has been over a year  Discussed heart healthy diet/eating habits as well  4. HTN     No changes today  5. Smoking     Counseled at length, he is receptive    Disposition:  Continue Q 3 month remote device checks, 1 year in-clinic EP visit.  See Dr. Stanford Breed at a next available to catch up with him   Current medicines are reviewed at length with the patient today.  The patient did not have any concerns regarding medicines.  Venetia Night, PA-C 05/02/2017 10:07 AM     [facility] [place] [address] [phone] (office)  [phone] (fax)

## 2017-05-02 ENCOUNTER — Ambulatory Visit: Payer: Self-pay | Admitting: Surgery

## 2017-05-02 ENCOUNTER — Ambulatory Visit (INDEPENDENT_AMBULATORY_CARE_PROVIDER_SITE_OTHER): Payer: Medicare Other | Admitting: Physician Assistant

## 2017-05-02 VITALS — BP 142/80 | HR 52 | Ht 71.0 in | Wt 225.0 lb

## 2017-05-02 DIAGNOSIS — I10 Essential (primary) hypertension: Secondary | ICD-10-CM

## 2017-05-02 DIAGNOSIS — I2510 Atherosclerotic heart disease of native coronary artery without angina pectoris: Secondary | ICD-10-CM | POA: Diagnosis not present

## 2017-05-02 DIAGNOSIS — Z95810 Presence of automatic (implantable) cardiac defibrillator: Secondary | ICD-10-CM

## 2017-05-02 DIAGNOSIS — I255 Ischemic cardiomyopathy: Secondary | ICD-10-CM | POA: Diagnosis not present

## 2017-05-02 DIAGNOSIS — F17200 Nicotine dependence, unspecified, uncomplicated: Secondary | ICD-10-CM | POA: Diagnosis not present

## 2017-05-02 DIAGNOSIS — I5022 Chronic systolic (congestive) heart failure: Secondary | ICD-10-CM | POA: Diagnosis not present

## 2017-05-02 MED ORDER — FUROSEMIDE 20 MG PO TABS: 20.0000 mg | ORAL_TABLET | Freq: Every day | ORAL | 1 refills | Status: DC

## 2017-05-02 MED ORDER — LISINOPRIL 20 MG PO TABS
20.0000 mg | ORAL_TABLET | Freq: Every morning | ORAL | 6 refills | Status: DC
Start: 2017-05-02 — End: 2017-08-23

## 2017-05-02 MED ORDER — NITROGLYCERIN 0.4 MG SL SUBL: 0.4000 mg | SUBLINGUAL_TABLET | SUBLINGUAL | 3 refills | Status: DC | PRN

## 2017-05-02 NOTE — Patient Instructions (Addendum)
Medication Instructions:   Your physician recommends that you continue on your current medications as directed. Please refer to the Current Medication list given to you today.  If you need a refill on your cardiac medications before your next appointment, please call your pharmacy.  Labwork: NONE ORDERED  TODAY    Testing/Procedures: NONE ORDERED  TODAY    Follow-Up:   DR. Stanford Breed  NEXT AVIALABLE     Your physician wants you to follow-up in: [place] will receive a reminder letter in the mail two months in advance. If you don't receive a letter, please call our office to schedule the follow-up appointment.    Remote monitoring is used to monitor your Pacemaker of ICD from home. This monitoring reduces the number of office visits required to check your device to one time per year. It allows [place] to keep an eye on the functioning of your device to ensure it is working properly. You are scheduled for a device check from home on . 05-30-17 You may send your transmission at any time that day. If you have a wireless device, the transmission will be sent automatically. After your physician reviews your transmission, you will receive a postcard with your next transmission date.     Any Other Special Instructions Will Be Listed Below (If Applicable).

## 2017-05-03 ENCOUNTER — Telehealth: Payer: Self-pay

## 2017-05-03 NOTE — Telephone Encounter (Signed)
Referred to ICM clinic by Tommye Standard, PA.  Attempted ICM intro call and left message with number to return call.

## 2017-05-11 ENCOUNTER — Encounter: Payer: Self-pay | Admitting: General Practice

## 2017-05-30 ENCOUNTER — Ambulatory Visit (INDEPENDENT_AMBULATORY_CARE_PROVIDER_SITE_OTHER): Payer: Medicare Other | Admitting: *Deleted

## 2017-05-30 DIAGNOSIS — I255 Ischemic cardiomyopathy: Secondary | ICD-10-CM | POA: Diagnosis not present

## 2017-05-30 NOTE — Progress Notes (Signed)
Remote ICD transmission.   

## 2017-05-31 ENCOUNTER — Encounter: Payer: Self-pay | Admitting: Cardiology

## 2017-06-01 ENCOUNTER — Telehealth: Payer: Self-pay

## 2017-06-01 NOTE — Telephone Encounter (Signed)
Patient was called and a voicemail was left informing patient to contact office. 

## 2017-06-05 NOTE — Telephone Encounter (Signed)
Attempted ICM intro call to patient.  Requested a call back at his earliest convenience. ICM direct number given for call back.

## 2017-06-15 LAB — CUP PACEART REMOTE DEVICE CHECK
Battery Remaining Longevity: 42 mo
Battery Remaining Percentage: 36 %
Brady Statistic RV Percent Paced: 1 %
HIGH POWER IMPEDANCE MEASURED VALUE: 48 Ohm
Implantable Lead Implant Date: 20101111
Implantable Lead Model: 7121
Lead Channel Impedance Value: 450 Ohm
Lead Channel Sensing Intrinsic Amplitude: 12 mV
Lead Channel Setting Pacing Amplitude: 2.5 V
Lead Channel Setting Pacing Pulse Width: 0.5 ms
MDC IDC LEAD LOCATION: 753860
MDC IDC MSMT BATTERY VOLTAGE: 2.9 V
MDC IDC MSMT LEADCHNL RV PACING THRESHOLD AMPLITUDE: 1 V
MDC IDC MSMT LEADCHNL RV PACING THRESHOLD PULSEWIDTH: 0.5 ms
MDC IDC PG IMPLANT DT: 20101111
MDC IDC SESS DTM: 20190227081730
MDC IDC SET LEADCHNL RV SENSING SENSITIVITY: 0.5 mV
Pulse Gen Serial Number: 743367

## 2017-07-03 ENCOUNTER — Other Ambulatory Visit: Payer: Self-pay

## 2017-07-03 ENCOUNTER — Encounter (HOSPITAL_COMMUNITY): Payer: Self-pay

## 2017-07-03 ENCOUNTER — Emergency Department (HOSPITAL_COMMUNITY)
Admission: EM | Admit: 2017-07-03 | Discharge: 2017-07-03 | Disposition: A | Discharge status: Home / Self Care | Payer: Medicare Other | Attending: Emergency Medicine | Admitting: Emergency Medicine

## 2017-07-03 ENCOUNTER — Emergency Department (HOSPITAL_COMMUNITY): Payer: Medicare Other

## 2017-07-03 DIAGNOSIS — Z95810 Presence of automatic (implantable) cardiac defibrillator: Secondary | ICD-10-CM | POA: Insufficient documentation

## 2017-07-03 DIAGNOSIS — I252 Old myocardial infarction: Secondary | ICD-10-CM | POA: Diagnosis not present

## 2017-07-03 DIAGNOSIS — I110 Hypertensive heart disease with heart failure: Secondary | ICD-10-CM | POA: Insufficient documentation

## 2017-07-03 DIAGNOSIS — I509 Heart failure, unspecified: Secondary | ICD-10-CM | POA: Diagnosis not present

## 2017-07-03 DIAGNOSIS — I2510 Atherosclerotic heart disease of native coronary artery without angina pectoris: Secondary | ICD-10-CM | POA: Diagnosis not present

## 2017-07-03 DIAGNOSIS — J069 Acute upper respiratory infection, unspecified: Secondary | ICD-10-CM | POA: Diagnosis not present

## 2017-07-03 DIAGNOSIS — Z79899 Other long term (current) drug therapy: Secondary | ICD-10-CM | POA: Insufficient documentation

## 2017-07-03 DIAGNOSIS — Z951 Presence of aortocoronary bypass graft: Secondary | ICD-10-CM | POA: Insufficient documentation

## 2017-07-03 DIAGNOSIS — R0602 Shortness of breath: Secondary | ICD-10-CM | POA: Diagnosis not present

## 2017-07-03 DIAGNOSIS — Z7982 Long term (current) use of aspirin: Secondary | ICD-10-CM | POA: Insufficient documentation

## 2017-07-03 DIAGNOSIS — I493 Ventricular premature depolarization: Secondary | ICD-10-CM | POA: Diagnosis not present

## 2017-07-03 DIAGNOSIS — R062 Wheezing: Secondary | ICD-10-CM | POA: Insufficient documentation

## 2017-07-03 DIAGNOSIS — R05 Cough: Secondary | ICD-10-CM | POA: Diagnosis not present

## 2017-07-03 DIAGNOSIS — Z87891 Personal history of nicotine dependence: Secondary | ICD-10-CM | POA: Insufficient documentation

## 2017-07-03 DIAGNOSIS — R509 Fever, unspecified: Secondary | ICD-10-CM | POA: Diagnosis not present

## 2017-07-03 LAB — CBC WITH DIFFERENTIAL/PLATELET
Basophils Absolute: 0.1 10*3/uL (ref 0.0–0.1)
Basophils Relative: 1 %
EOS ABS: 0.2 10*3/uL (ref 0.0–0.7)
EOS PCT: 3 %
HCT: 47.4 % (ref 39.0–52.0)
Hemoglobin: 16.7 g/dL (ref 13.0–17.0)
LYMPHS ABS: 1.7 10*3/uL (ref 0.7–4.0)
LYMPHS PCT: 19 %
MCH: 29.8 pg (ref 26.0–34.0)
MCHC: 35.2 g/dL (ref 30.0–36.0)
MCV: 84.5 fL (ref 78.0–100.0)
MONOS PCT: 10 %
Monocytes Absolute: 0.8 10*3/uL (ref 0.1–1.0)
Neutro Abs: 5.8 10*3/uL (ref 1.7–7.7)
Neutrophils Relative %: 67 %
Platelets: 169 10*3/uL (ref 150–400)
RBC: 5.61 MIL/uL (ref 4.22–5.81)
RDW: 15 % (ref 11.5–15.5)
WBC: 8.6 10*3/uL (ref 4.0–10.5)

## 2017-07-03 LAB — BASIC METABOLIC PANEL
Anion gap: 9 (ref 5–15)
CO2: 22 mmol/L (ref 22–32)
CREATININE: 1.03 mg/dL (ref 0.61–1.24)
Calcium: 9.1 mg/dL (ref 8.9–10.3)
Chloride: 106 mmol/L (ref 101–111)
GFR calc Af Amer: >60 mL/min (ref >60)
GFR calc non Af Amer: >60 mL/min (ref >60)
GLUCOSE: 98 mg/dL (ref 65–99)
POTASSIUM: 3.9 mmol/L (ref 3.5–5.1)
SODIUM: 137 mmol/L (ref 135–145)

## 2017-07-03 LAB — I-STAT TROPONIN, ED: TROPONIN I, POC: 0.01 ng/mL (ref 0.00–0.08)

## 2017-07-03 MED ORDER — BENZONATATE 100 MG PO CAPS: 200.0000 mg | ORAL_CAPSULE | Freq: Two times a day (BID) | ORAL | 0 refills | Status: DC | PRN

## 2017-07-03 MED ORDER — ALBUTEROL SULFATE (2.5 MG/3ML) 0.083% IN NEBU
5.0000 mg | INHALATION_SOLUTION | Freq: Once | RESPIRATORY_TRACT | Status: AC
  Administered 2017-07-03: 5 mg via RESPIRATORY_TRACT
  Filled 2017-07-03: qty 6

## 2017-07-03 MED ORDER — ALBUTEROL SULFATE HFA 108 (90 BASE) MCG/ACT IN AERS
2.0000 | INHALATION_SPRAY | RESPIRATORY_TRACT | Status: DC | PRN
  Administered 2017-07-03: 2 via RESPIRATORY_TRACT
  Filled 2017-07-03: qty 6.7

## 2017-07-03 MED ORDER — AZITHROMYCIN 250 MG PO TABS: 250.0000 mg | ORAL_TABLET | Freq: Every day | ORAL | 0 refills | Status: DC

## 2017-07-03 NOTE — Discharge Instructions (Addendum)
Please return for chest pain, high fever, or worsening shortness of breath or for any other concerning symptoms.

## 2017-07-03 NOTE — ED Triage Notes (Addendum)
Pt presents with 1 week h/o nonproductive cough with onset of shortness of breath last night. Pt reports especially when lying supine, he can't get enough air.  Pt denies any chest pain, reports mid to upper back pain.  Pt is diaphoretic on arrival

## 2017-07-03 NOTE — ED Provider Notes (Addendum)
[facility] EMERGENCY DEPARTMENT Provider Note   CSN: [id number] Arrival date & time: 07/03/17  2108     History   Chief Complaint Chief Complaint  Patient presents with  . Shortness of Breath    HPI Billy [name] is a 52 y.o. male.  Patient presents to the emergency department with a chief complaint of shortness of breath.  He reports having cough, congestion, and cold symptoms for the past week.  He states that he felt slightly short of breath when he lie down tonight.  He reports having subjective fevers and chills, but has not measured a temperature at home.  He reports that he has had some productive cough.  States that his wife recently had pneumonia.  States that he is concerned about this.  He denies any chest pain.  Denies any swelling of the extremities.  Denies any recent long travel or recent surgeries.  He has tried taking over-the-counter cough and cold medicine with some relief.  He was given a breathing treatment in triage, and feels significantly improved.  The history is provided by the patient. No language interpreter was used.    Past Medical History:  Diagnosis Date  . Blood in stool   . Cardiac arrest - ventricular fibrillation 02/11/2009   a. s/p ICD  . CHF (congestive heart failure) ([place])   . Coronary artery disease    s/p CABG  . Hyperlipidemia   . Hypertension   . Ischemic cardiomyopathy   . Marijuana abuse   . Myocardial infarct ([name])    NON-ST-SEGMENT ELEVATION MI  . Peripheral vascular disease, unspecified [facility])     Patient Active Problem List   Diagnosis Date Noted  . Seasonal allergies 04/19/2017  . S/P quadruple vessel bypass 04/19/2017  . Prediabetes 04/19/2017  . Dysuria 06/06/2016  . GERD (gastroesophageal reflux disease) 01/31/2016  . Implantable cardioverter-defibrillator (ICD) in situ 06/12/2011  . VENTRICULAR FIBRILLATION 03/02/2009  . Cardiomyopathy, ischemic 08/25/2008  . Hyperlipidemia 01/15/2008  .  Essential hypertension 01/15/2008  . CORONARY ARTERY DISEASE 01/15/2008  . PERIPHERAL VASCULAR DISEASE 01/15/2008  . HEMATOCHEZIA 01/15/2008  . CHEST PAIN 01/15/2008    Past Surgical History:  Procedure Laterality Date  . CARDIAC DEFIBRILLATOR PLACEMENT     STJ single chamber ICD implanted for secondary prevention  . CIRCUMCISION    . CORONARY ARTERY BYPASS GRAFT  06/17/01   X 4        Home Medications    Prior to Admission medications   Medication Sig Start Date End Date Taking? Authorizing Provider  acetaminophen (TYLENOL) 500 MG tablet Take 1,000 mg by mouth every 8 (eight) hours as needed (pain).   Yes [provider]  aspirin 81 MG EC tablet Take 81 mg by mouth every morning.    Yes [provider]  atorvastatin (LIPITOR) 80 MG tablet Take 1 tablet (80 mg total) by mouth daily. 04/19/17  Yes Charlott Rakes, MD  carvedilol (COREG) 25 MG tablet Take 1 tablet (25 mg total) by mouth 2 (two) times daily. 04/19/17  Yes Charlott Rakes, MD  dicyclomine (BENTYL) 20 MG tablet Take 1 tablet (20 mg total) by mouth 2 (two) times daily. Patient taking differently: Take 20 mg by mouth as needed.  04/19/17  Yes Charlott Rakes, MD  esomeprazole (NEXIUM) 40 MG capsule Take 1 capsule (40 mg total) by mouth daily. 04/19/17  Yes Charlott Rakes, MD  furosemide (LASIX) 20 MG tablet Take 1 tablet (20 mg total) by mouth daily. 05/02/17  Yes Baldwin Jamaica, PA-C  lisinopril (PRINIVIL,ZESTRIL) 20 MG tablet Take 1 tablet (20 mg total) by mouth every morning. 05/02/17  Yes Baldwin Jamaica, PA-C  montelukast (SINGULAIR) 10 MG tablet Take 1 tablet (10 mg total) by mouth at bedtime. Must keep Nov appt w/new provider for future refills 02/02/17  Yes Biagio Borg, MD  Multiple Vitamin (MULTIVITAMIN) tablet Take 1 tablet by mouth every morning.    Yes [provider]  nitroGLYCERIN (NITROSTAT) 0.4 MG SL tablet Place 1 tablet (0.4 mg total) under the tongue every 5 (five) minutes as  needed for chest pain. 05/02/17 07/31/17 Yes Baldwin Jamaica, PA-C  ondansetron (ZOFRAN) 4 MG tablet Take 1 tablet (4 mg total) by mouth every 8 (eight) hours as needed for nausea or vomiting. 04/19/17  Yes Newlin, Charlane Ferretti, MD  potassium chloride SA (KLOR-CON M20) 20 MEQ tablet Take 1 tablet (20 mEq total) by mouth daily. 04/19/17  Yes Charlott Rakes, MD  sucralfate (CARAFATE) 1 g tablet Take 1 tablet (1 g total) by mouth 4 (four) times daily -  with meals and at bedtime. 07/03/16  Yes Nche, Charlene Brooke, NP  albuterol (PROVENTIL HFA;VENTOLIN HFA) 108 (90 BASE) MCG/ACT inhaler Inhale 1-2 puffs into the lungs every 6 (six) hours as needed for wheezing or shortness of breath. Patient not taking: Reported on 07/03/2017 07/22/14   Ernestina Patches, MD    Family History Family History  Problem Relation Age of Onset  . Hypertension Mother   . Heart attack Father        DIED AT 61 FROM MI  . Heart disease Sister        CAD AND PREVIOUS CABG  . Hypertension Unknown        IN MOST OF HIS SIBLINGS    Social History Social History   Tobacco Use  . Smoking status: Former Smoker    Packs/day: 2.00    Years: 20.00    Pack years: 40.00    Last attempt to quit: 04/04/2003    Years since quitting: 14.2  . Smokeless tobacco: Never Used  Substance Use Topics  . Alcohol use: No  . Drug use: No     Allergies   Patient has no known allergies.   Review of Systems Review of Systems  All other systems reviewed and are negative.    Physical Exam Updated Vital Signs BP 136/76   Pulse (!) 46   Temp 98.3 F (36.8 C) (Oral)   Resp (!) 26   Ht 5\' 11"  (1.803 m)   Wt 97.5 kg (215 lb)   SpO2 94%   BMI 29.99 kg/m   Physical Exam  Constitutional: He is oriented to person, place, and time. He appears well-developed and well-nourished.  HENT:  Head: Normocephalic and atraumatic.  Eyes: Pupils are equal, round, and reactive to light. Conjunctivae and EOM are normal. Right eye exhibits no discharge.  Left eye exhibits no discharge. No scleral icterus.  Neck: Normal range of motion. Neck supple. No JVD present.  Cardiovascular: Normal rate, regular rhythm and normal heart sounds. Exam reveals no gallop and no friction rub.  No murmur heard. Pulmonary/Chest: Effort normal. No respiratory distress. He has wheezes. He has no rales. He exhibits no tenderness.  Faint wheeze in left upper lobe  Abdominal: Soft. He exhibits no distension and no mass. There is no tenderness. There is no rebound and no guarding.  Musculoskeletal: Normal range of motion. He exhibits no edema or tenderness.  Neurological: He  is alert and oriented to person, place, and time.  Skin: Skin is warm and dry.  Psychiatric: He has a normal mood and affect. His behavior is normal. Judgment and thought content normal.  Nursing note and vitals reviewed.    ED Treatments / Results  Labs (all labs ordered are listed, but only abnormal results are displayed) Labs Reviewed  BASIC METABOLIC PANEL - Abnormal; Notable for the following components:      Result Value   BUN <5 (*)    All other components within normal limits  CBC WITH DIFFERENTIAL/PLATELET  I-STAT TROPONIN, ED    EKG EKG Interpretation  Date/Time:  Tuesday July 03 2017 21:15:55 EDT Ventricular Rate:  65 PR Interval:  124 QRS Duration: 98 QT Interval:  406 QTC Calculation: 422 R Axis:   80 Text Interpretation:  Sinus rhythm with occasional Premature ventricular complexes Possible Left atrial enlargement Cannot rule out Inferior infarct , age undetermined Abnormal ECG [id number] seen on prior Otherwise no significant change Confirmed by Deno Etienne [phone]) on 07/03/2017 10:45:29 PM   Radiology Dg Chest 2 View  Result Date: 07/03/2017 CLINICAL DATA:  Shortness of breath and cough for several days EXAM: CHEST - 2 VIEW COMPARISON:  07/22/2014 FINDINGS: Cardiac shadow is stable. Postsurgical changes are again seen. Defibrillator is again noted. Lungs are mildly  hyperinflated. No focal infiltrate or sizable effusion is seen. No bony abnormality is noted. IMPRESSION: COPD without acute abnormality. Electronically Signed   By: Inez Catalina M.D.   On: 07/03/2017 21:50    Procedures Procedures (including critical care time)  Medications Ordered in ED Medications  albuterol (PROVENTIL HFA;VENTOLIN HFA) 108 (90 Base) MCG/ACT inhaler 2 puff (has no administration in time range)  albuterol (PROVENTIL) (2.5 MG/3ML) 0.083% nebulizer solution 5 mg (5 mg Nebulization Given 07/03/17 2124)     Initial Impression / Assessment and Plan / ED Course  I have reviewed the triage vital signs and the nursing notes.  Pertinent labs & imaging results that were available during my care of the patient were reviewed by me and considered in my medical decision making (see chart for details).     Patient with shortness of breath that occurred today when he lied down.  He has had cough and cold symptoms for the past week.  He reports having intermittent fevers and chills.  He has no chest pain.  Improved significantly after breathing treatment in triage.  Did still have a faint wheeze in his left upper lobe.  Plan is for discharge with inhaler, and will try azithromycin given length of illness.  Laboratory workup is reassuring.  Troponin is negative.  EKG unchanged from prior.  He does have a history of PE and DVT, but states that this does not feel similar to that.  He attributes all of his symptoms to the cough and congestion, and is mainly concerned about pneumonia.  He is not tachycardic nor hypoxic and had significant relief of his symptoms after breathing treatment.  He has no chest pain.  I doubt PE.  Patient given strict return precautions.  He understands and agrees with the plan.    Vitals:   07/03/17 2200 07/03/17 2245  BP: 138/77 133/85  Pulse: 60 (!) 55  Resp: 20 15  Temp:    SpO2: 96% 96%      Final Clinical Impressions(s) / ED Diagnoses   Final  diagnoses:  Upper respiratory tract infection, unspecified type    ED Discharge Orders  Ordered    azithromycin (ZITHROMAX) 250 MG tablet  Daily     07/03/17 2255    benzonatate (TESSALON) 100 MG capsule  2 times daily PRN     07/03/17 2255          Montine Circle, PA-C 07/03/17 [place], [place], DO 07/03/17 2338

## 2017-07-19 ENCOUNTER — Ambulatory Visit: Payer: Medicare Other | Admitting: Family Medicine

## 2017-07-23 ENCOUNTER — Other Ambulatory Visit: Payer: Self-pay

## 2017-07-27 NOTE — Telephone Encounter (Signed)
Unable to reach patient for ICM follow up.

## 2017-08-01 NOTE — Progress Notes (Signed)
HPI: FU CAD. Patient suffered a cardiac arrest in November of 2010. Cardiac catheterization at that time revealed an ejection fraction of 35%. Normal left main. The LAD had scattered 20-30% lesions. The first diagonal was occluded. The left circumflex had scattered 30-40% lesions and then occluded following the OM. The OM is stented, and the stent is widely patent without significant stenosis. The second OM and distal circumflex fill from left-to-left collaterals. The right coronary artery was known to be totally occluded in its proximal segment. The distal right coronary artery branches fill from left-to-right collaterals. The saphenous vein graft to first diagonal is widely patent. There is mild 40-50% stenosis of the diagonal just after the graft insertion site. Left internal mammary artery to LAD is patent. Saphenous vein graft to distal right coronary artery is known to be occluded. The saphenous vein graft to OM branch was totally occluded. This is a chronic occlusion as well. Echocardiogram initially revealed an EF of 10-15% however followup echo showed an EF of 40-45%, mild biatrial enlargement and mild mitral regurgitation. Given out of the hospital VF arrest a [employer] single lead ICD was placed. Nuclear study 1/14 showed EF 38, inferior MI with peri-infarct ischemia. Since I last saw him,  patient notes some chest discomfort with more vigorous activities relieved in 5 to 10 minutes with rest.  He also notes the symptoms when he becomes "stressed".  He does not have symptoms otherwise.  He notes some dyspnea on exertion.  No orthopnea, PND, pedal edema or syncope.  He has claudication in his right leg after ambulating more prolonged distances.  Current Outpatient Medications  Medication Sig Dispense Refill  . acetaminophen (TYLENOL) 500 MG tablet Take 1,000 mg by mouth every 8 (eight) hours as needed (pain).    [name] albuterol (PROVENTIL HFA;VENTOLIN HFA) 108 (90 BASE) MCG/ACT inhaler Inhale 1-2  puffs into the lungs every 6 (six) hours as needed for wheezing or shortness of breath. 1 Inhaler 0  . aspirin 81 MG EC tablet Take 81 mg by mouth every morning.     [name] atorvastatin (LIPITOR) 80 MG tablet Take 1 tablet (80 mg total) by mouth daily. 30 tablet 6  . carvedilol (COREG) 25 MG tablet Take 1 tablet (25 mg total) by mouth 2 (two) times daily. 60 tablet 6  . dicyclomine (BENTYL) 20 MG tablet Take 1 tablet (20 mg total) by mouth 2 (two) times daily. (Patient taking differently: Take 20 mg by mouth as needed. ) 30 tablet 3  . esomeprazole (NEXIUM) 40 MG capsule Take 1 capsule (40 mg total) by mouth daily. 30 capsule 6  . furosemide (LASIX) 20 MG tablet Take 1 tablet (20 mg total) by mouth daily. 90 tablet 1  . lisinopril (PRINIVIL,ZESTRIL) 20 MG tablet Take 1 tablet (20 mg total) by mouth every morning. 30 tablet 6  . montelukast (SINGULAIR) 10 MG tablet Take 1 tablet (10 mg total) by mouth at bedtime. Must keep Nov appt w/new provider for future refills 30 tablet 5  . Multiple Vitamin (MULTIVITAMIN) tablet Take 1 tablet by mouth every morning.     . nitroGLYCERIN (NITROSTAT) 0.4 MG SL tablet Place 1 tablet (0.4 mg total) under the tongue every 5 (five) minutes as needed for chest pain. 25 tablet 3  . ondansetron (ZOFRAN) 4 MG tablet Take 1 tablet (4 mg total) by mouth every 8 (eight) hours as needed for nausea or vomiting. 30 tablet 1  . potassium chloride SA (KLOR-CON M20)  20 MEQ tablet Take 1 tablet (20 mEq total) by mouth daily. 30 tablet 6  . sucralfate (CARAFATE) 1 g tablet Take 1 tablet (1 g total) by mouth 4 (four) times daily -  with meals and at bedtime. 120 tablet 3   No current facility-administered medications for this visit.      Past Medical History:  Diagnosis Date  . Blood in stool   . Cardiac arrest - ventricular fibrillation 02/11/2009   a. s/p ICD  . CHF (congestive heart failure) ([place])   . Coronary artery disease    s/p CABG  . Hyperlipidemia   . Hypertension     . Ischemic cardiomyopathy   . Marijuana abuse   . Myocardial infarct ([name])    NON-ST-SEGMENT ELEVATION MI  . Peripheral vascular disease, unspecified ([place])     Past Surgical History:  Procedure Laterality Date  . CARDIAC DEFIBRILLATOR PLACEMENT     STJ single chamber ICD implanted for secondary prevention  . CIRCUMCISION    . CORONARY ARTERY BYPASS GRAFT  06/17/01   X 4    Social History   Socioeconomic History  . Marital status: Married    Spouse name: Not on file  . Number of children: 3  . Years of education: 69  . Highest education level: Not on file  Occupational History  . Occupation: Disability  Social Needs  . Financial resource strain: Not on file  . Food insecurity:    Worry: Not on file    Inability: Not on file  . Transportation needs:    Medical: Not on file    Non-medical: Not on file  Tobacco Use  . Smoking status: Former Smoker    Packs/day: 2.00    Years: 20.00    Pack years: 40.00    Last attempt to quit: 04/04/2003    Years since quitting: 14.3  . Smokeless tobacco: Never Used  Substance and Sexual Activity  . Alcohol use: No  . Drug use: No  . Sexual activity: Yes    Partners: Female  Lifestyle  . Physical activity:    Days per week: Not on file    Minutes per session: Not on file  . Stress: Not on file  Relationships  . Social connections:    Talks on phone: Not on file    Gets together: Not on file    Attends religious service: Not on file    Active member of club or organization: Not on file    Attends meetings of clubs or organizations: Not on file    Relationship status: Not on file  . Intimate partner violence:    Fear of current or ex partner: Not on file    Emotionally abused: Not on file    Physically abused: Not on file    Forced sexual activity: Not on file  Other Topics Concern  . Not on file  Social History Narrative   MARRIED   FORMER TOBACCO USE, SMOKED FOR 20 YRS 2 PPD; QUIT IN 2005   NO ETOH   NO ILLICIT DRUG  USE   NO REGULAR EXERCISE         ICD-[employer] ; CERTIFIED LETTER SENT AND RETURNED BAD ADDRESS, PLS UPDATE DJW.      Fun: Fishing     Family History  Problem Relation Age of Onset  . Hypertension Mother   . Heart attack Father        DIED AT 15 FROM MI  . Heart disease Sister  CAD AND PREVIOUS CABG  . Hypertension Unknown        IN MOST OF HIS SIBLINGS    ROS: no fevers or chills, productive cough, hemoptysis, dysphasia, odynophagia, melena, hematochezia, dysuria, hematuria, rash, seizure activity, orthopnea, PND, pedal edema, claudication. Remaining systems are negative.  Physical Exam: Well-developed well-nourished in no acute distress.  Skin is warm and dry.  HEENT is normal.  Neck is supple.  Chest is clear to auscultation with normal expansion.  Cardiovascular exam is regular rate and rhythm.  Abdominal exam nontender or distended. No masses palpated. Extremities show no edema. neuro grossly intact  Electrocardiogram July 03, 2017-sinus rhythm, PACs, nonspecific ST changes and prior inferior infarct.  A/P  1 coronary artery disease-Continue medical therapy including aspirin and statin.  Patient describes chest pain with more vigorous activities relieved with rest.  I discussed options today including nuclear study for risk stratification versus cardiac catheterization.  He would like to avoid catheterization if possible.  I will arrange a [place] nuclear study with low threshold to proceed with catheterization.  I will add isosorbide 30 mg daily.  2 hypertension-blood pressure is controlled.  Continue present medications.  3 hyperlipidemia-continue statin.  Recent LDL greater than 70.  Add Zetia 10 mg daily.  Check lipids and liver in 4 weeks.  4 ischemic cardiomyopathy-continue ACE inhibitor and beta-blocker.  No symptoms of CHF.  5 prior ICD-followed by Dr. Caryl Comes.  6 tobacco abuse-patient counseled on discontinuing.  Kirk Ruths, MD

## 2017-08-07 ENCOUNTER — Encounter: Payer: Self-pay | Admitting: Cardiology

## 2017-08-07 ENCOUNTER — Ambulatory Visit (INDEPENDENT_AMBULATORY_CARE_PROVIDER_SITE_OTHER): Payer: Medicare Other | Admitting: Cardiology

## 2017-08-07 VITALS — BP 120/88 | HR 62 | Ht 71.5 in | Wt 219.0 lb

## 2017-08-07 DIAGNOSIS — I255 Ischemic cardiomyopathy: Secondary | ICD-10-CM | POA: Diagnosis not present

## 2017-08-07 DIAGNOSIS — E7800 Pure hypercholesterolemia, unspecified: Secondary | ICD-10-CM | POA: Diagnosis not present

## 2017-08-07 DIAGNOSIS — R072 Precordial pain: Secondary | ICD-10-CM

## 2017-08-07 DIAGNOSIS — I25118 Atherosclerotic heart disease of native coronary artery with other forms of angina pectoris: Secondary | ICD-10-CM | POA: Diagnosis not present

## 2017-08-07 DIAGNOSIS — I10 Essential (primary) hypertension: Secondary | ICD-10-CM

## 2017-08-07 MED ORDER — ISOSORBIDE MONONITRATE ER 30 MG PO TB24: 30.0000 mg | ORAL_TABLET | Freq: Every day | ORAL | 3 refills | Status: DC

## 2017-08-07 MED ORDER — EZETIMIBE 10 MG PO TABS: 10.0000 mg | ORAL_TABLET | Freq: Every day | ORAL | 3 refills | Status: DC

## 2017-08-07 NOTE — Patient Instructions (Signed)
Medication Instructions:   START ISOSORBIDE 30 MG ONCE DAILY= TAKE 1/2 TABLET ONCE DAILY X 3 DAYS THEN INCREASE TO 1 TABLET DAILY  START EZETIMIBE 10 MG ONCE DAILY  Labwork:  Your physician recommends that you return for lab work in: [place]  Testing/Procedures:  Your physician has requested that you have a lexiscan myoview. For further information please visit [email or web address]. Please follow instruction sheet, as given.DO NOT TAKE FUROSEMIDE THE DAY OF TEST    Follow-Up:  Your physician recommends that you schedule a follow-up appointment in: [place]   If you need a refill on your cardiac medications before your next appointment, please call your pharmacy.

## 2017-08-09 ENCOUNTER — Other Ambulatory Visit: Payer: Self-pay | Admitting: Internal Medicine

## 2017-08-14 ENCOUNTER — Telehealth (HOSPITAL_COMMUNITY): Payer: Self-pay

## 2017-08-14 NOTE — Telephone Encounter (Signed)
Encounter complete. 

## 2017-08-16 ENCOUNTER — Ambulatory Visit (HOSPITAL_COMMUNITY)
Admission: RE | Admit: 2017-08-16 | Discharge: 2017-08-16 | Disposition: A | Discharge status: Home / Self Care | Payer: Medicare Other | Source: Ambulatory Visit | Attending: Cardiology | Admitting: Cardiology

## 2017-08-16 ENCOUNTER — Inpatient Hospital Stay (HOSPITAL_COMMUNITY): Admission: RE | Admit: 2017-08-16 | Payer: Medicare Other | Source: Ambulatory Visit

## 2017-08-16 DIAGNOSIS — R9439 Abnormal result of other cardiovascular function study: Secondary | ICD-10-CM | POA: Insufficient documentation

## 2017-08-16 DIAGNOSIS — R072 Precordial pain: Secondary | ICD-10-CM | POA: Insufficient documentation

## 2017-08-16 LAB — MYOCARDIAL PERFUSION IMAGING
CHL CUP NUCLEAR SSS: 14
CSEPPHR: 71 {beats}/min
LV sys vol: 188 mL
LVDIAVOL: 268 mL (ref 62–150)
Rest HR: 45 {beats}/min
SDS: 1
SRS: 13
TID: 1.19

## 2017-08-16 MED ORDER — TECHNETIUM TC 99M TETROFOSMIN IV KIT
10.9000 | PACK | Freq: Once | INTRAVENOUS | Status: AC | PRN
  Administered 2017-08-16: 10.9 via INTRAVENOUS
  Filled 2017-08-16: qty 11

## 2017-08-16 MED ORDER — REGADENOSON 0.4 MG/5ML IV SOLN
0.4000 mg | Freq: Once | INTRAVENOUS | Status: AC
  Administered 2017-08-16: 0.4 mg via INTRAVENOUS

## 2017-08-16 MED ORDER — TECHNETIUM TC 99M TETROFOSMIN IV KIT
31.0000 | PACK | Freq: Once | INTRAVENOUS | Status: AC | PRN
  Administered 2017-08-16: 31 via INTRAVENOUS
  Filled 2017-08-16: qty 31

## 2017-08-17 ENCOUNTER — Telehealth: Payer: Self-pay | Admitting: *Deleted

## 2017-08-17 NOTE — Telephone Encounter (Addendum)
Left message for pt to call    ----- Message from Lelon Perla, MD sent at 08/16/2017  2:24 PM EDT ----- If pts symptoms stable, plan medical therapy for now; call with worsening CP Kirk Ruths

## 2017-08-20 NOTE — Telephone Encounter (Signed)
Spoke to patien'ts wife. Result given . Verbalized understanding  

## 2017-08-20 NOTE — Telephone Encounter (Signed)
Left message to call back  

## 2017-08-20 NOTE — Telephone Encounter (Signed)
Follow up   Patients wife is returning call in reference to lab results. Please call

## 2017-08-23 ENCOUNTER — Encounter: Payer: Self-pay | Admitting: Family Medicine

## 2017-08-23 ENCOUNTER — Ambulatory Visit: Payer: Medicare Other | Attending: Family Medicine | Admitting: Family Medicine

## 2017-08-23 VITALS — BP 149/90 | HR 61 | Temp 97.4°F | Ht 71.0 in | Wt 218.8 lb

## 2017-08-23 DIAGNOSIS — Z95810 Presence of automatic (implantable) cardiac defibrillator: Secondary | ICD-10-CM | POA: Insufficient documentation

## 2017-08-23 DIAGNOSIS — K580 Irritable bowel syndrome with diarrhea: Secondary | ICD-10-CM | POA: Diagnosis not present

## 2017-08-23 DIAGNOSIS — I509 Heart failure, unspecified: Secondary | ICD-10-CM | POA: Diagnosis not present

## 2017-08-23 DIAGNOSIS — I739 Peripheral vascular disease, unspecified: Secondary | ICD-10-CM | POA: Diagnosis not present

## 2017-08-23 DIAGNOSIS — K2100 Gastro-esophageal reflux disease with esophagitis, without bleeding: Secondary | ICD-10-CM

## 2017-08-23 DIAGNOSIS — Z1211 Encounter for screening for malignant neoplasm of colon: Secondary | ICD-10-CM

## 2017-08-23 DIAGNOSIS — I10 Essential (primary) hypertension: Secondary | ICD-10-CM

## 2017-08-23 DIAGNOSIS — I110 Hypertensive heart disease with heart failure: Secondary | ICD-10-CM | POA: Insufficient documentation

## 2017-08-23 DIAGNOSIS — I255 Ischemic cardiomyopathy: Secondary | ICD-10-CM

## 2017-08-23 DIAGNOSIS — Z7982 Long term (current) use of aspirin: Secondary | ICD-10-CM | POA: Diagnosis not present

## 2017-08-23 DIAGNOSIS — Z79899 Other long term (current) drug therapy: Secondary | ICD-10-CM | POA: Insufficient documentation

## 2017-08-23 DIAGNOSIS — I2510 Atherosclerotic heart disease of native coronary artery without angina pectoris: Secondary | ICD-10-CM | POA: Diagnosis not present

## 2017-08-23 DIAGNOSIS — Z8674 Personal history of sudden cardiac arrest: Secondary | ICD-10-CM | POA: Insufficient documentation

## 2017-08-23 DIAGNOSIS — Z951 Presence of aortocoronary bypass graft: Secondary | ICD-10-CM | POA: Diagnosis not present

## 2017-08-23 DIAGNOSIS — K589 Irritable bowel syndrome without diarrhea: Secondary | ICD-10-CM | POA: Insufficient documentation

## 2017-08-23 DIAGNOSIS — I252 Old myocardial infarction: Secondary | ICD-10-CM | POA: Diagnosis not present

## 2017-08-23 DIAGNOSIS — E7800 Pure hypercholesterolemia, unspecified: Secondary | ICD-10-CM | POA: Diagnosis not present

## 2017-08-23 MED ORDER — POTASSIUM CHLORIDE CRYS ER 20 MEQ PO TBCR: 20.0000 meq | EXTENDED_RELEASE_TABLET | Freq: Every day | ORAL | 1 refills | Status: DC

## 2017-08-23 MED ORDER — FUROSEMIDE 20 MG PO TABS: 20.0000 mg | ORAL_TABLET | Freq: Every day | ORAL | 1 refills | Status: DC

## 2017-08-23 MED ORDER — DICYCLOMINE HCL 20 MG PO TABS: 20.0000 mg | ORAL_TABLET | Freq: Two times a day (BID) | ORAL | 1 refills | Status: DC

## 2017-08-23 MED ORDER — LISINOPRIL-HYDROCHLOROTHIAZIDE 20-25 MG PO TABS: 1.0000 | ORAL_TABLET | Freq: Every day | ORAL | 1 refills | Status: DC

## 2017-08-23 MED ORDER — MONTELUKAST SODIUM 10 MG PO TABS: 10.0000 mg | ORAL_TABLET | Freq: Every day | ORAL | 1 refills | Status: DC

## 2017-08-23 MED ORDER — EZETIMIBE 10 MG PO TABS: 10.0000 mg | ORAL_TABLET | Freq: Every day | ORAL | 1 refills | Status: DC

## 2017-08-23 MED ORDER — ISOSORBIDE MONONITRATE ER 30 MG PO TB24
30.0000 mg | ORAL_TABLET | Freq: Every day | ORAL | 1 refills | Status: DC
Start: 2017-08-23 — End: 2018-05-21

## 2017-08-23 MED ORDER — ESOMEPRAZOLE MAGNESIUM 40 MG PO CPDR: 40.0000 mg | DELAYED_RELEASE_CAPSULE | Freq: Every day | ORAL | 1 refills | Status: DC

## 2017-08-23 MED ORDER — ONDANSETRON HCL 4 MG PO TABS: 4.0000 mg | ORAL_TABLET | Freq: Three times a day (TID) | ORAL | 1 refills | Status: DC | PRN

## 2017-08-23 MED ORDER — CARVEDILOL 25 MG PO TABS: 25.0000 mg | ORAL_TABLET | Freq: Two times a day (BID) | ORAL | 1 refills | Status: DC

## 2017-08-23 MED ORDER — ATORVASTATIN CALCIUM 80 MG PO TABS: 80.0000 mg | ORAL_TABLET | Freq: Every day | ORAL | 1 refills | Status: DC

## 2017-08-23 NOTE — Progress Notes (Signed)
Subjective:  Patient ID: Billy [name], male    DOB: 1966-03-04  Age: 52 y.o. MRN: [id number]  CC: Hypertension   HPI Jeet Deremer is a 52 year old male with a history of hypertension, ischemic cardiomyopathy status post quadruple bypass, history of V. fib status post St. Jude single lead ICD in 2011, GERD here for follow-up visit. He was commenced on Bentyl at his last office visit and reports improvement in IBS symptoms ever since.  His acid reflux is controlled on omeprazole and he uses Zofran as needed for nausea. His blood pressure is elevated and he endorses compliance with his antihypertensive and a low-sodium diet. He denies chest pains, shortness of breath, pedal edema. Doing well on his statin and denies myalgias or other adverse effects.  Past Medical History:  Diagnosis Date  . Blood in stool   . Cardiac arrest - ventricular fibrillation 02/11/2009   a. s/p ICD  . CHF (congestive heart failure) ([place])   . Coronary artery disease    s/p CABG  . Hyperlipidemia   . Hypertension   . Ischemic cardiomyopathy   . Marijuana abuse   . Myocardial infarct ([name])    NON-ST-SEGMENT ELEVATION MI  . Peripheral vascular disease, unspecified ([place])     Past Surgical History:  Procedure Laterality Date  . CARDIAC DEFIBRILLATOR PLACEMENT     STJ single chamber ICD implanted for secondary prevention  . CIRCUMCISION    . CORONARY ARTERY BYPASS GRAFT  06/17/01   X 4    No Known Allergies   Outpatient Medications Prior to Visit  Medication Sig Dispense Refill  . acetaminophen (TYLENOL) 500 MG tablet Take 1,000 mg by mouth every 8 (eight) hours as needed (pain).    [name] aspirin 81 MG EC tablet Take 81 mg by mouth every morning.     . Multiple Vitamin (MULTIVITAMIN) tablet Take 1 tablet by mouth every morning.     . sucralfate (CARAFATE) 1 g tablet Take 1 tablet (1 g total) by mouth 4 (four) times daily -  with meals and at bedtime. 120 tablet 3  . atorvastatin (LIPITOR) 80 MG tablet  Take 1 tablet (80 mg total) by mouth daily. 30 tablet 6  . carvedilol (COREG) 25 MG tablet Take 1 tablet (25 mg total) by mouth 2 (two) times daily. 60 tablet 6  . dicyclomine (BENTYL) 20 MG tablet Take 1 tablet (20 mg total) by mouth 2 (two) times daily. (Patient taking differently: Take 20 mg by mouth as needed. ) 30 tablet 3  . esomeprazole (NEXIUM) 40 MG capsule Take 1 capsule (40 mg total) by mouth daily. 30 capsule 6  . ezetimibe (ZETIA) 10 MG tablet Take 1 tablet (10 mg total) by mouth daily. 90 tablet 3  . furosemide (LASIX) 20 MG tablet Take 1 tablet (20 mg total) by mouth daily. 90 tablet 1  . isosorbide mononitrate (IMDUR) 30 MG 24 hr tablet Take 1 tablet (30 mg total) by mouth daily. 90 tablet 3  . lisinopril (PRINIVIL,ZESTRIL) 20 MG tablet Take 1 tablet (20 mg total) by mouth every morning. 30 tablet 6  . montelukast (SINGULAIR) 10 MG tablet TAKE 1 TABLET BY MOUTH AT BEDTIME. MUST KEEP NOV APPT W/NEW PROVIDER FOR FUTURE REFILLS 30 tablet 5  . ondansetron (ZOFRAN) 4 MG tablet Take 1 tablet (4 mg total) by mouth every 8 (eight) hours as needed for nausea or vomiting. 30 tablet 1  . potassium chloride SA (KLOR-CON M20) 20 MEQ tablet Take 1 tablet (20 mEq  total) by mouth daily. 30 tablet 6  . nitroGLYCERIN (NITROSTAT) 0.4 MG SL tablet Place 1 tablet (0.4 mg total) under the tongue every 5 (five) minutes as needed for chest pain. 25 tablet 3  . albuterol (PROVENTIL HFA;VENTOLIN HFA) 108 (90 BASE) MCG/ACT inhaler Inhale 1-2 puffs into the lungs every 6 (six) hours as needed for wheezing or shortness of breath. (Patient not taking: Reported on 08/23/2017) 1 Inhaler 0   No facility-administered medications prior to visit.     ROS Review of Systems  Constitutional: Negative for activity change and appetite change.  HENT: Negative for sinus pressure and sore throat.   Eyes: Negative for visual disturbance.  Respiratory: Negative for cough, chest tightness and shortness of breath.     Cardiovascular: Negative for chest pain and leg swelling.  Gastrointestinal: Negative for abdominal distention, abdominal pain, constipation and diarrhea.  Endocrine: Negative.   Genitourinary: Negative for dysuria.  Musculoskeletal: Negative for joint swelling and myalgias.  Skin: Negative for rash.  Allergic/Immunologic: Negative.   Neurological: Negative for weakness, light-headedness and numbness.  Psychiatric/Behavioral: Negative for dysphoric mood and suicidal ideas.    Objective:  BP (!) 149/90   Pulse 61   Temp (!) 97.4 F (36.3 C) (Oral)   Ht 5\' 11"  (1.803 m)   Wt 218 lb 12.8 oz (99.2 kg)   SpO2 99%   BMI 30.52 kg/m   BP/Weight 08/23/2017 06/30/5186 07/02/6604  Systolic BP 301 - 601  Diastolic BP 90 - 88  Wt. (Lbs) 218.8 219 219  BMI 30.52 29.7 30.12      Physical Exam  Constitutional: He is oriented to person, place, and time. He appears well-developed and well-nourished.  Cardiovascular: Normal rate, normal heart sounds and intact distal pulses.  No murmur heard. Pulmonary/Chest: Effort normal and breath sounds normal. He has no wheezes. He has no rales. He exhibits no tenderness.  Vertical sternotomy scar  Abdominal: Soft. Bowel sounds are normal. He exhibits no distension and no mass. There is no tenderness.  Musculoskeletal: Normal range of motion.  Neurological: He is alert and oriented to person, place, and time.  Skin: Skin is warm and dry.  Psychiatric: He has a normal mood and affect.     Assessment & Plan:   1. S/P quadruple vessel bypass Asymptomatic Risk factor modification - atorvastatin (LIPITOR) 80 MG tablet; Take 1 tablet (80 mg total) by mouth daily.  Dispense: 90 tablet; Refill: 1  2. Essential hypertension Slightly elevated Switch from lisinopril to lisinopril/HCTZ Low sodium, DASH diet - carvedilol (COREG) 25 MG tablet; Take 1 tablet (25 mg total) by mouth 2 (two) times daily.  Dispense: 180 tablet; Refill: 1 -  lisinopril-hydrochlorothiazide (PRINZIDE,ZESTORETIC) 20-25 MG tablet; Take 1 tablet by mouth daily.  Dispense: 90 tablet; Refill: 1 - isosorbide mononitrate (IMDUR) 30 MG 24 hr tablet; Take 1 tablet (30 mg total) by mouth daily.  Dispense: 90 tablet; Refill: 1  3. Gastroesophageal reflux disease with esophagitis Controlled - esomeprazole (NEXIUM) 40 MG capsule; Take 1 capsule (40 mg total) by mouth daily.  Dispense: 90 capsule; Refill: 1 - ondansetron (ZOFRAN) 4 MG tablet; Take 1 tablet (4 mg total) by mouth every 8 (eight) hours as needed for nausea or vomiting.  Dispense: 30 tablet; Refill: 1  4. Pure hypercholesterolemia Controlled Low-cholesterol diet - ezetimibe (ZETIA) 10 MG tablet; Take 1 tablet (10 mg total) by mouth daily.  Dispense: 90 tablet; Refill: 1  5. Screening for colon cancer - Ambulatory referral to Gastroenterology  6.  Irritable bowel syndrome with diarrhea Improved - dicyclomine (BENTYL) 20 MG tablet; Take 1 tablet (20 mg total) by mouth 2 (two) times daily.  Dispense: 180 tablet; Refill: 1   Meds ordered this encounter  Medications  . furosemide (LASIX) 20 MG tablet    Sig: Take 1 tablet (20 mg total) by mouth daily.    Dispense:  90 tablet    Refill:  1  . atorvastatin (LIPITOR) 80 MG tablet    Sig: Take 1 tablet (80 mg total) by mouth daily.    Dispense:  90 tablet    Refill:  1  . carvedilol (COREG) 25 MG tablet    Sig: Take 1 tablet (25 mg total) by mouth 2 (two) times daily.    Dispense:  180 tablet    Refill:  1  . esomeprazole (NEXIUM) 40 MG capsule    Sig: Take 1 capsule (40 mg total) by mouth daily.    Dispense:  90 capsule    Refill:  1  . ezetimibe (ZETIA) 10 MG tablet    Sig: Take 1 tablet (10 mg total) by mouth daily.    Dispense:  90 tablet    Refill:  1  . potassium chloride SA (KLOR-CON M20) 20 MEQ tablet    Sig: Take 1 tablet (20 mEq total) by mouth daily.    Dispense:  90 tablet    Refill:  1  . ondansetron (ZOFRAN) 4 MG tablet     Sig: Take 1 tablet (4 mg total) by mouth every 8 (eight) hours as needed for nausea or vomiting.    Dispense:  30 tablet    Refill:  1  . lisinopril-hydrochlorothiazide (PRINZIDE,ZESTORETIC) 20-25 MG tablet    Sig: Take 1 tablet by mouth daily.    Dispense:  90 tablet    Refill:  1    Discontinue lisinopril  . isosorbide mononitrate (IMDUR) 30 MG 24 hr tablet    Sig: Take 1 tablet (30 mg total) by mouth daily.    Dispense:  90 tablet    Refill:  1  . dicyclomine (BENTYL) 20 MG tablet    Sig: Take 1 tablet (20 mg total) by mouth 2 (two) times daily.    Dispense:  180 tablet    Refill:  1  . montelukast (SINGULAIR) 10 MG tablet    Sig: Take 1 tablet (10 mg total) by mouth at bedtime.    Dispense:  90 tablet    Refill:  1    Follow-up: Return in about 3 months (around 11/23/2017) for Follow-up of chronic medical conditions.   Charlott Rakes MD

## 2017-08-23 NOTE — Patient Instructions (Signed)
Irritable Bowel Syndrome, Adult Irritable bowel syndrome (IBS) is not one specific disease. It is a group of symptoms that affects the organs responsible for digestion (gastrointestinal or GI tract). To regulate how your GI tract works, your body sends signals back and forth between your intestines and your brain. If you have IBS, there may be a problem with these signals. As a result, your GI tract does not function normally. Your intestines may become more sensitive and overreact to certain things. This is especially true when you eat certain foods or when you are under stress. There are four types of IBS. These may be determined based on the consistency of your stool:  IBS with diarrhea.  IBS with constipation.  Mixed IBS.  Unsubtyped IBS.  It is important to know which type of IBS you have. Some treatments are more likely to be helpful for certain types of IBS. What are the causes? The exact cause of IBS is not known. What increases the risk? You may have a higher risk of IBS if:  You are a woman.  You are younger than 51 years old.  You have a family history of IBS.  You have mental health problems.  You have had bacterial infection of your GI tract.  What are the signs or symptoms? Symptoms of IBS vary from person to person. The main symptom is abdominal pain or discomfort. Additional symptoms usually include one or more of the following:  Diarrhea, constipation, or both.  Abdominal swelling or bloating.  Feeling full or sick after eating a small or regular-size meal.  Frequent gas.  Mucus in the stool.  A feeling of having more stool left after a bowel movement.  Symptoms tend to come and go. They may be associated with stress, psychiatric conditions, or nothing at all. How is this diagnosed? There is no specific test to diagnose IBS. Your health care provider will make a diagnosis based on a physical exam, medical history, and your symptoms. You may have other  tests to rule out other conditions that may be causing your symptoms. These may include:  Blood tests.  X-rays.  CT scan.  Endoscopy and colonoscopy. This is a test in which your GI tract is viewed with a long, thin, flexible tube.  How is this treated? There is no cure for IBS, but treatment can help relieve symptoms. IBS treatment often includes:  Changes to your diet, such as: ? Eating more fiber. ? Avoiding foods that cause symptoms. ? Drinking more water. ? Eating regular, medium-sized portioned meals.  Medicines. These may include: ? Fiber supplements if you have constipation. ? Medicine to control diarrhea (antidiarrheal medicines). ? Medicine to help control muscle spasms in your GI tract (antispasmodic medicines). ? Medicines to help with any mental health issues, such as antidepressants or tranquilizers.  Therapy. ? Talk therapy may help with anxiety, depression, or other mental health issues that can make IBS symptoms worse.  Stress reduction. ? Managing your stress can help keep symptoms under control.  Follow these instructions at home:  Take medicines only as directed by your health care provider.  Eat a healthy diet. ? Avoid foods and drinks with added sugar. ? Include more whole grains, fruits, and vegetables gradually into your diet. This may be especially helpful if you have IBS with constipation. ? Avoid any foods and drinks that make your symptoms worse. These may include dairy products and caffeinated or carbonated drinks. ? Do not eat large meals. ? Drink enough   fluid to keep your urine clear or pale yellow.  Exercise regularly. Ask your health care provider for recommendations of good activities for you.  Keep all follow-up visits as directed by your health care provider. This is important. Contact a health care provider if:  You have constant pain.  You have trouble or pain with swallowing.  You have worsening diarrhea. Get help right away  if:  You have severe and worsening abdominal pain.  You have diarrhea and: ? You have a rash, stiff neck, or severe headache. ? You are irritable, sleepy, or difficult to awaken. ? You are weak, dizzy, or extremely thirsty.  You have bright red blood in your stool or you have black tarry stools.  You have unusual abdominal swelling that is painful.  You vomit continuously.  You vomit blood (hematemesis).  You have both abdominal pain and a fever. This information is not intended to replace advice given to you by your health care provider. Make sure you discuss any questions you have with your health care provider. Document Released: 03/20/2005 Document Revised: 08/20/2015 Document Reviewed: 12/05/2013 Elsevier Interactive Patient Education  2018 Elsevier Inc.  

## 2017-08-29 ENCOUNTER — Ambulatory Visit (INDEPENDENT_AMBULATORY_CARE_PROVIDER_SITE_OTHER): Payer: Medicare Other | Admitting: *Deleted

## 2017-08-29 DIAGNOSIS — I4901 Ventricular fibrillation: Secondary | ICD-10-CM | POA: Diagnosis not present

## 2017-08-29 NOTE — Progress Notes (Signed)
Remote ICD transmission.   

## 2017-09-03 ENCOUNTER — Encounter: Payer: Self-pay | Admitting: Gastroenterology

## 2017-09-18 LAB — CUP PACEART REMOTE DEVICE CHECK
Battery Remaining Longevity: 41 mo
Battery Remaining Percentage: 34 %
HighPow Impedance: 54 Ohm
Implantable Lead Implant Date: 20101111
Implantable Lead Location: 753860
Implantable Lead Model: 7121
Implantable Pulse Generator Implant Date: 20101111
Lead Channel Pacing Threshold Pulse Width: 0.5 ms
Lead Channel Sensing Intrinsic Amplitude: 12 mV
Lead Channel Setting Pacing Amplitude: 2.5 V
Lead Channel Setting Pacing Pulse Width: 0.5 ms
MDC IDC MSMT BATTERY VOLTAGE: 2.9 V
MDC IDC MSMT LEADCHNL RV IMPEDANCE VALUE: 490 Ohm
MDC IDC MSMT LEADCHNL RV PACING THRESHOLD AMPLITUDE: 1 V
MDC IDC PG SERIAL: 743367
MDC IDC SESS DTM: 20190529060015
MDC IDC SET LEADCHNL RV SENSING SENSITIVITY: 0.5 mV
MDC IDC STAT BRADY RV PERCENT PACED: 1 %

## 2017-09-27 ENCOUNTER — Encounter: Payer: Self-pay | Admitting: *Deleted

## 2017-10-03 ENCOUNTER — Other Ambulatory Visit: Payer: Self-pay

## 2017-10-03 DIAGNOSIS — Z951 Presence of aortocoronary bypass graft: Secondary | ICD-10-CM

## 2017-10-03 MED ORDER — ATORVASTATIN CALCIUM 80 MG PO TABS: 80.0000 mg | ORAL_TABLET | Freq: Every day | ORAL | 1 refills | Status: DC

## 2017-10-18 DIAGNOSIS — E7800 Pure hypercholesterolemia, unspecified: Secondary | ICD-10-CM | POA: Diagnosis not present

## 2017-10-18 LAB — HEPATIC FUNCTION PANEL
ALBUMIN: 4.1 g/dL (ref 3.5–5.5)
ALT: 18 IU/L (ref 0–44)
AST: 17 IU/L (ref 0–40)
Alkaline Phosphatase: 86 IU/L (ref 39–117)
BILIRUBIN TOTAL: 0.5 mg/dL (ref 0.0–1.2)
BILIRUBIN, DIRECT: 0.16 mg/dL (ref 0.00–0.40)
Total Protein: 6.5 g/dL (ref 6.0–8.5)

## 2017-10-18 LAB — LIPID PANEL
CHOL/HDL RATIO: 3.1 ratio (ref 0.0–5.0)
Cholesterol, Total: 142 mg/dL (ref 100–199)
HDL: 46 mg/dL (ref >39)
LDL CALC: 84 mg/dL (ref 0–99)
TRIGLYCERIDES: 58 mg/dL (ref 0–149)
VLDL Cholesterol Cal: 12 mg/dL (ref 5–40)

## 2017-11-05 ENCOUNTER — Telehealth: Payer: Self-pay

## 2017-11-05 NOTE — Telephone Encounter (Signed)
Pt has complicated cardiac hx and needs a office visit. Pt EF less than 30% LMTRC.

## 2017-11-06 NOTE — Progress Notes (Signed)
HPI: FU CAD. Patient suffered a cardiac arrest in November of 2010. Cardiac catheterization at that time revealed an ejection fraction of 35%. Normal left main. The LAD had scattered 20-30% lesions. The first diagonal was occluded. The left circumflex had scattered 30-40% lesions and then occluded following the OM. The OM is stented, and the stent is widely patent without significant stenosis. The second OM and distal circumflex fill from left-to-left collaterals. The right coronary artery was known to be totally occluded in its proximal segment. The distal right coronary artery branches fill from left-to-right collaterals. The saphenous vein graft to first diagonal is widely patent. There is mild 40-50% stenosis of the diagonal just after the graft insertion site. Left internal mammary artery to LAD is patent. Saphenous vein graft to distal right coronary artery is known to be occluded. The saphenous vein graft to OM branch was totally occluded. This is a chronic occlusion as well. Echocardiogram initially revealed an EF of 10-15% however followup echo showed an EF of 40-45%, mild biatrial enlargement and mild mitral regurgitation. Given out of the hospital VF arrest a St. Jude single lead ICD was placed. Nuclear study 5/19 showed EF 30, inferior MI with mild peri-infarct ischemia.Since I last saw him,  he has had no further chest pain, dyspnea, orthopnea, PND, pedal edema or syncope.  He does have claudication in his right lower extremity with ambulation.  Current Outpatient Medications  Medication Sig Dispense Refill  . acetaminophen (TYLENOL) 500 MG tablet Take 1,000 mg by mouth every 8 (eight) hours as needed (pain).    [name] aspirin 81 MG EC tablet Take 81 mg by mouth every morning.     [name] atorvastatin (LIPITOR) 80 MG tablet Take 1 tablet (80 mg total) by mouth daily. 90 tablet 1  . carvedilol (COREG) 25 MG tablet Take 1 tablet (25 mg total) by mouth 2 (two) times daily. 180 tablet 1  .  dicyclomine (BENTYL) 20 MG tablet Take 1 tablet (20 mg total) by mouth 2 (two) times daily. 180 tablet 1  . esomeprazole (NEXIUM) 40 MG capsule Take 1 capsule (40 mg total) by mouth daily. 90 capsule 1  . ezetimibe (ZETIA) 10 MG tablet Take 1 tablet (10 mg total) by mouth daily. 90 tablet 1  . furosemide (LASIX) 20 MG tablet Take 1 tablet (20 mg total) by mouth daily. 90 tablet 1  . isosorbide mononitrate (IMDUR) 30 MG 24 hr tablet Take 1 tablet (30 mg total) by mouth daily. 90 tablet 1  . lisinopril-hydrochlorothiazide (PRINZIDE,ZESTORETIC) 20-25 MG tablet Take 1 tablet by mouth daily. 90 tablet 1  . montelukast (SINGULAIR) 10 MG tablet Take 1 tablet (10 mg total) by mouth at bedtime. 90 tablet 1  . Multiple Vitamin (MULTIVITAMIN) tablet Take 1 tablet by mouth every morning.     . nitroGLYCERIN (NITROSTAT) 0.4 MG SL tablet Place 1 tablet (0.4 mg total) under the tongue every 5 (five) minutes as needed for chest pain. 25 tablet 3  . ondansetron (ZOFRAN) 4 MG tablet Take 1 tablet (4 mg total) by mouth every 8 (eight) hours as needed for nausea or vomiting. 30 tablet 1  . potassium chloride SA (KLOR-CON M20) 20 MEQ tablet Take 1 tablet (20 mEq total) by mouth daily. 90 tablet 1  . sucralfate (CARAFATE) 1 g tablet Take 1 tablet (1 g total) by mouth 4 (four) times daily -  with meals and at bedtime. 120 tablet 3   No current facility-administered medications for  this visit.      Past Medical History:  Diagnosis Date  . Blood in stool   . Cardiac arrest - ventricular fibrillation 02/11/2009   a. s/p ICD  . CHF (congestive heart failure) ([place])   . Coronary artery disease    s/p CABG  . Hyperlipidemia   . Hypertension   . Ischemic cardiomyopathy   . Marijuana abuse   . Myocardial infarct ([name])    NON-ST-SEGMENT ELEVATION MI  . Peripheral vascular disease, unspecified ([place])     Past Surgical History:  Procedure Laterality Date  . CARDIAC DEFIBRILLATOR PLACEMENT     STJ single chamber ICD  implanted for secondary prevention  . CIRCUMCISION    . CORONARY ARTERY BYPASS GRAFT  06/17/01   X 4    Social History   Socioeconomic History  . Marital status: Married    Spouse name: Not on file  . Number of children: 3  . Years of education: 34  . Highest education level: Not on file  Occupational History  . Occupation: Disability  Social Needs  . Financial resource strain: Not on file  . Food insecurity:    Worry: Not on file    Inability: Not on file  . Transportation needs:    Medical: Not on file    Non-medical: Not on file  Tobacco Use  . Smoking status: Former Smoker    Packs/day: 2.00    Years: 20.00    Pack years: 40.00    Last attempt to quit: 04/04/2003    Years since quitting: 14.6  . Smokeless tobacco: Never Used  Substance and Sexual Activity  . Alcohol use: No  . Drug use: No  . Sexual activity: Yes    Partners: Female  Lifestyle  . Physical activity:    Days per week: Not on file    Minutes per session: Not on file  . Stress: Not on file  Relationships  . Social connections:    Talks on phone: Not on file    Gets together: Not on file    Attends religious service: Not on file    Active member of club or organization: Not on file    Attends meetings of clubs or organizations: Not on file    Relationship status: Not on file  . Intimate partner violence:    Fear of current or ex partner: Not on file    Emotionally abused: Not on file    Physically abused: Not on file    Forced sexual activity: Not on file  Other Topics Concern  . Not on file  Social History Narrative   MARRIED   FORMER TOBACCO USE, SMOKED FOR 20 YRS 2 PPD; QUIT IN 2005   NO ETOH   NO ILLICIT DRUG USE   NO REGULAR EXERCISE         ICD-[facility] [facility] ; CERTIFIED LETTER SENT AND RETURNED BAD ADDRESS, PLS UPDATE DJW.      Fun: Fishing     Family History  Problem Relation Age of Onset  . Hypertension Mother   . Heart attack Father        DIED AT 74 FROM MI  . Heart disease  Sister        CAD AND PREVIOUS CABG  . Hypertension Unknown        IN MOST OF HIS SIBLINGS    ROS: no fevers or chills, productive cough, hemoptysis, dysphasia, odynophagia, melena, hematochezia, dysuria, hematuria, rash, seizure activity, orthopnea, PND, pedal edema. Remaining  systems are negative.  Physical Exam: Well-developed well-nourished in no acute distress.  Skin is warm and dry.  HEENT is normal.  Neck is supple.  Chest is clear to auscultation with normal expansion.  Cardiovascular exam is regular rate and rhythm.  Abdominal exam nontender or distended. No masses palpated. Extremities show no edema. neuro grossly intact   A/P  1 coronary artery disease-patient is feeling better since last office visit.  He denies recurrent chest pain.  Nuclear study showed inferior infarct with mild peri-infarct ischemia.  Continue medical therapy.  Continue aspirin, statin and beta-blocker.  2 hypertension-blood pressure is controlled.  Continue present medications.  3 hyperlipidemia-continue statin.  4 ischemic cardiomyopathy-plan to continue carvedilol.  Discontinue lisinopril for 36 hours.  Then begin Entresto 24/26 twice daily.  Titrate as an outpatient.  Check potassium and renal function in 1 week.  5 prior ICD-followed by Dr. Caryl Comes.  6 tobacco abuse-patient counseled on discontinuing.  7 claudication-patient has right lower extremity claudication.  Schedule ABIs with Doppler to further assess.  May need to be evaluated by Dr. Fletcher Anon or Dr. Gwenlyn Found.  Kirk Ruths, MD

## 2017-11-07 NOTE — Telephone Encounter (Signed)
Called pt on his cell number and left message to please schedule an OV with our doctor. Gwyndolyn Saxon

## 2017-11-08 ENCOUNTER — Encounter: Payer: Self-pay | Admitting: Cardiology

## 2017-11-08 ENCOUNTER — Ambulatory Visit (INDEPENDENT_AMBULATORY_CARE_PROVIDER_SITE_OTHER): Payer: Medicare Other | Admitting: Cardiology

## 2017-11-08 VITALS — BP 126/83 | HR 73 | Ht 71.0 in | Wt 209.4 lb

## 2017-11-08 DIAGNOSIS — I739 Peripheral vascular disease, unspecified: Secondary | ICD-10-CM | POA: Diagnosis not present

## 2017-11-08 DIAGNOSIS — I10 Essential (primary) hypertension: Secondary | ICD-10-CM | POA: Diagnosis not present

## 2017-11-08 DIAGNOSIS — I2510 Atherosclerotic heart disease of native coronary artery without angina pectoris: Secondary | ICD-10-CM

## 2017-11-08 DIAGNOSIS — I255 Ischemic cardiomyopathy: Secondary | ICD-10-CM

## 2017-11-08 MED ORDER — SACUBITRIL-VALSARTAN 24-26 MG PO TABS: 1.0000 | ORAL_TABLET | Freq: Two times a day (BID) | ORAL | 6 refills | Status: DC

## 2017-11-08 MED ORDER — SACUBITRIL-VALSARTAN 24-26 MG PO TABS
1.0000 | ORAL_TABLET | Freq: Two times a day (BID) | ORAL | Status: DC
Start: 2017-11-08 — End: 2018-04-30

## 2017-11-08 NOTE — Patient Instructions (Addendum)
Medication Instructions:   STOP LISINOPRIL=AFTER 2 DAYS=START ENTRESTO 24/26 MG ONE TABLET TWICE DAILY  LAB WORK:Your physician recommends that you return for lab work in: [place]  Testing/Procedures:  Your physician has requested that you have a lower extremity arterial duplex. This test is an ultrasound of the arteries in the legs or arms. It looks at arterial blood flow in the legs and arms. Allow one hour for Lower and Upper Arterial scans. There are no restrictions or special instructions   Follow-Up:  Your physician wants you to follow-up in: [place] will receive a reminder letter in the mail two months in advance. If you don't receive a letter, please call our office to schedule the follow-up appointment.   If you need a refill on your cardiac medications before your next appointment, please call your pharmacy.

## 2017-11-09 ENCOUNTER — Encounter: Payer: Self-pay | Admitting: Family Medicine

## 2017-11-09 NOTE — Telephone Encounter (Signed)
Will mail patient a letter.

## 2017-11-15 ENCOUNTER — Telehealth: Payer: Self-pay | Admitting: Cardiology

## 2017-11-15 NOTE — Telephone Encounter (Signed)
Spoke with pt's wife and pt has noted weakness,chest heaviness (feel funny),gradually coming on since starting Entresto on Sunday Per pt does not like taking this med and also per wife pt was told by Pharmacists not to take Furosemide so pt has stopped as well as the [place] con .Will forward to Dr Stanford Breed for review .Adonis Housekeeper

## 2017-11-15 NOTE — Telephone Encounter (Signed)
New Message   Pt c/o medication issue:  1. Name of Medication: sacubitril-valsartan (ENTRESTO) 24-26 MG  2. How are you currently taking this medication (dosage and times per day)? Take 1 tablet by mouth 2 (two) times daily.  3. Are you having a reaction (difficulty breathing--STAT)? yes  4. What is your medication issue? Pt wife is calling states the pt says he is feeling yucky on this medication

## 2017-11-15 NOTE — Telephone Encounter (Signed)
Lm for pt to call back ./cy 

## 2017-11-15 NOTE — Telephone Encounter (Signed)
DC lasix and Kdur and continue entresto and follow symptoms; if no improvement with these changes call us back and we will adjust further Billy [name]

## 2017-11-20 MED ORDER — LISINOPRIL-HYDROCHLOROTHIAZIDE 20-25 MG PO TABS: 1.0000 | ORAL_TABLET | Freq: Every day | ORAL | 3 refills | Status: DC

## 2017-11-20 NOTE — Telephone Encounter (Signed)
Resume medications that pt was on prior to last ov and dc entresto [employer]

## 2017-11-20 NOTE — Telephone Encounter (Signed)
Spoke with pt wife, Aware of dr Jacalyn Lefevre recommendations. Script for lisinopril/hct sent to the pharmacy.

## 2017-11-20 NOTE — Telephone Encounter (Signed)
Spoke with pt wife, she reports the patient stopped the entresto and went back to the furosemide and potassium. She reports the patient has never felt the way he did on the entresto. She is asking if he can go back to the lisinopril. Will forward for dr Stanford Breed review

## 2017-11-20 NOTE — Telephone Encounter (Signed)
Left message for pt to call.

## 2017-11-21 ENCOUNTER — Ambulatory Visit (HOSPITAL_COMMUNITY)
Admission: RE | Admit: 2017-11-21 | Discharge: 2017-11-21 | Disposition: A | Discharge status: Home / Self Care | Payer: Medicare Other | Source: Ambulatory Visit | Attending: Cardiovascular Disease | Admitting: Cardiovascular Disease

## 2017-11-21 DIAGNOSIS — I739 Peripheral vascular disease, unspecified: Secondary | ICD-10-CM | POA: Insufficient documentation

## 2017-11-26 ENCOUNTER — Encounter: Payer: Medicare Other | Admitting: Gastroenterology

## 2017-11-27 ENCOUNTER — Ambulatory Visit: Payer: Medicare Other | Admitting: Family Medicine

## 2017-11-28 ENCOUNTER — Ambulatory Visit (INDEPENDENT_AMBULATORY_CARE_PROVIDER_SITE_OTHER): Payer: Medicare Other | Admitting: *Deleted

## 2017-11-28 DIAGNOSIS — I255 Ischemic cardiomyopathy: Secondary | ICD-10-CM

## 2017-11-28 NOTE — Progress Notes (Signed)
Remote ICD transmission.   

## 2017-12-04 ENCOUNTER — Ambulatory Visit (INDEPENDENT_AMBULATORY_CARE_PROVIDER_SITE_OTHER): Payer: Medicare Other | Admitting: Cardiovascular Disease

## 2017-12-04 ENCOUNTER — Encounter: Payer: Self-pay | Admitting: Cardiovascular Disease

## 2017-12-04 VITALS — BP 110/70 | HR 61 | Ht 71.0 in | Wt 213.0 lb

## 2017-12-04 DIAGNOSIS — I739 Peripheral vascular disease, unspecified: Secondary | ICD-10-CM

## 2017-12-04 DIAGNOSIS — I255 Ischemic cardiomyopathy: Secondary | ICD-10-CM

## 2017-12-04 DIAGNOSIS — E7800 Pure hypercholesterolemia, unspecified: Secondary | ICD-10-CM

## 2017-12-04 DIAGNOSIS — I5022 Chronic systolic (congestive) heart failure: Secondary | ICD-10-CM

## 2017-12-04 DIAGNOSIS — I2510 Atherosclerotic heart disease of native coronary artery without angina pectoris: Secondary | ICD-10-CM

## 2017-12-04 DIAGNOSIS — Z720 Tobacco use: Secondary | ICD-10-CM

## 2017-12-04 NOTE — Progress Notes (Signed)
Cardiology Office Note   Date:  12/04/2017   ID:  Billy [name], DOB 12-Aug-1965, MRN [id number]  PCP:  Charlott Rakes, MD  Cardiologist:  Dr. Stanford Breed  Chief Complaint  Patient presents with  . Follow-up    Post dopplers.      History of Present Illness: Billy [name] is a 52 y.o. male who was referred by Dr. Stanford Breed for evaluation and management of peripheral arterial disease.  The patient had previous cardiac arrest in November 2010.  He had previous CABG.  He had an ICD placement done.  Most recent ischemic cardiac evaluation in May showed an EF of 30% with evidence of inferior wall MI with mild peri-infarct ischemia.  Other medical problems include hypertension, tobacco use , PAD and hyperlipidemia.  He had previous left SFA stent many years ago.   The patient recently complained of right leg claudication.  Recent noninvasive vascular evaluation showed an ABI of 0.63 on the right and 0.98 on the left.  Duplex showed patent left SFA stent.  On the right side, there was moderate disease affecting the SFA.  Monophasic waveforms were noted starting in the common iliac artery. The patient reports mostly right calf claudication after walking less than 1 block.  The pain radiates to his thigh.  The symptoms have been happening for about 6 to 12 months.  He has no rest pain or lower extremity ulceration.  He continues to smoke few cigarettes a day.  He is not diabetic.  No chest pain or shortness of breath.   Past Medical History:  Diagnosis Date  . Blood in stool   . Cardiac arrest - ventricular fibrillation 02/11/2009   a. s/p ICD  . CHF (congestive heart failure) ([place])   . Coronary artery disease    s/p CABG  . Hyperlipidemia   . Hypertension   . Ischemic cardiomyopathy   . Marijuana abuse   . Myocardial infarct ([name])    NON-ST-SEGMENT ELEVATION MI  . Peripheral vascular disease, unspecified ([place])     Past Surgical History:  Procedure Laterality Date  . CARDIAC  DEFIBRILLATOR PLACEMENT     STJ single chamber ICD implanted for secondary prevention  . CIRCUMCISION    . CORONARY ARTERY BYPASS GRAFT  06/17/01   X 4     Current Outpatient Medications  Medication Sig Dispense Refill  . acetaminophen (TYLENOL) 500 MG tablet Take 1,000 mg by mouth every 8 (eight) hours as needed (pain).    [name] aspirin 81 MG EC tablet Take 81 mg by mouth every morning.     [name] atorvastatin (LIPITOR) 80 MG tablet Take 1 tablet (80 mg total) by mouth daily. 90 tablet 1  . carvedilol (COREG) 25 MG tablet Take 1 tablet (25 mg total) by mouth 2 (two) times daily. 180 tablet 1  . dicyclomine (BENTYL) 20 MG tablet Take 1 tablet (20 mg total) by mouth 2 (two) times daily. 180 tablet 1  . esomeprazole (NEXIUM) 40 MG capsule Take 1 capsule (40 mg total) by mouth daily. 90 capsule 1  . furosemide (LASIX) 20 MG tablet Take 1 tablet (20 mg total) by mouth daily. 90 tablet 1  . lisinopril-hydrochlorothiazide (PRINZIDE,ZESTORETIC) 20-25 MG tablet Take 1 tablet by mouth daily. 90 tablet 3  . montelukast (SINGULAIR) 10 MG tablet Take 1 tablet (10 mg total) by mouth at bedtime. 90 tablet 1  . Multiple Vitamin (MULTIVITAMIN) tablet Take 1 tablet by mouth every morning.     . ondansetron (ZOFRAN) 4  MG tablet Take 1 tablet (4 mg total) by mouth every 8 (eight) hours as needed for nausea or vomiting. 30 tablet 1  . potassium chloride SA (KLOR-CON M20) 20 MEQ tablet Take 1 tablet (20 mEq total) by mouth daily. 90 tablet 1  . sucralfate (CARAFATE) 1 g tablet Take 1 tablet (1 g total) by mouth 4 (four) times daily -  with meals and at bedtime. 120 tablet 3  . ezetimibe (ZETIA) 10 MG tablet Take 1 tablet (10 mg total) by mouth daily. 90 tablet 1  . isosorbide mononitrate (IMDUR) 30 MG 24 hr tablet Take 1 tablet (30 mg total) by mouth daily. 90 tablet 1  . nitroGLYCERIN (NITROSTAT) 0.4 MG SL tablet Place 1 tablet (0.4 mg total) under the tongue every 5 (five) minutes as needed for chest pain. 25 tablet 3     Current Facility-Administered Medications  Medication Dose Route Frequency Provider Last Rate Last Dose  . sacubitril-valsartan (ENTRESTO) 24-26 mg per tablet  1 tablet Oral BID Lelon Perla, MD        Allergies:   Patient has no known allergies.    Social History:  The patient  reports that he quit smoking about 14 years ago. He has a 40.00 pack-year smoking history. He has never used smokeless tobacco. He reports that he does not drink alcohol or use drugs.   Family History:  The patient's family history includes Heart attack in his father; Heart disease in his sister; Hypertension in his mother and unknown relative.    ROS:  Please see the history of present illness.   Otherwise, review of systems are positive for none.   All other systems are reviewed and negative.    PHYSICAL EXAM: VS:  BP 110/70 (BP Location: Left Arm, Patient Position: Sitting, Cuff Size: Normal)   Pulse 61   Ht 5\' 11"  (1.803 m)   Wt 213 lb (96.6 kg)   BMI 29.71 kg/m  , BMI Body mass index is 29.71 kg/m. GEN: Well nourished, well developed, in no acute distress  HEENT: normal  Neck: no JVD, carotid bruits, or masses Cardiac: RRR; no murmurs, rubs, or gallops,no edema  Respiratory:  clear to auscultation bilaterally, normal work of breathing GI: soft, nontender, nondistended, + BS MS: no deformity or atrophy  Skin: warm and dry, no rash Neuro:  Strength and sensation are intact Psych: euthymic mood, full affect Vascular: Femoral pulses +1 on the right and +2 on the left.  Distal pulses are diminished on the left and not palpable on the right.  EKG:  EKG is not ordered today.    Recent Labs: 07/03/2017: BUN <5; Creatinine, Ser 1.03; Hemoglobin 16.7; Platelets 169; Potassium 3.9; Sodium 137 10/18/2017: ALT 18    Lipid Panel    Component Value Date/Time   CHOL 142 10/18/2017 0836   TRIG 58 10/18/2017 0836   HDL 46 10/18/2017 0836   CHOLHDL 3.1 10/18/2017 0836   CHOLHDL 3.1 12/07/2015 0739    VLDL 19 12/07/2015 0739   LDLCALC 84 10/18/2017 0836      Wt Readings from Last 3 Encounters:  12/04/17 213 lb (96.6 kg)  11/08/17 209 lb 6.4 oz (95 kg)  08/23/17 218 lb 12.8 oz (99.2 kg)       No flowsheet data found.    ASSESSMENT AND PLAN:  1.  Peripheral arterial disease with severe right leg claudication Rutherford class III: ABI was moderately reduced at 0.63.  Based on his physical exam and Doppler  findings, I suspect that he has significant inflow disease on the right side likely affecting the iliac artery.  I discussed with him the natural history and management of claudication.  Given severity of his symptoms and suspected iliac disease, I have suggested angiography and possible endovascular intervention.  However, the patient wants to wait few months before proceeding.  I instructed him to start a daily walking program and also strongly advised him to quit smoking.  He is otherwise on good medical therapy.  Reevaluate in 3 months.  2.  Coronary artery disease involving native coronary arteries without angina: Continue medical therapy.  3.  Chronic systolic heart failure: He appears to be euvolemic and currently on optimal medical therapy.  4.  Hyperlipidemia: Continue treatment with atorvastatin and Zetia with a target LDL of less than 70.  5.  Tobacco use: I had a prolonged discussion with him about the importance of smoking cessation.    Disposition:   FU with me in 3 months  Signed,  [name], MD  12/04/2017 8:49 AM    [place]

## 2017-12-04 NOTE — Patient Instructions (Signed)
Medication Instructions:  Your physician recommends that you continue on your current medications as directed. Please refer to the Current Medication list given to you today.   Labwork: None ordered  Testing/Procedures: None ordered  Follow-Up: Your physician recommends that you schedule a follow-up appointment in: 3 months with Dr.Arida   Any Other Special Instructions Will Be Listed Below (If Applicable).     If you need a refill on your cardiac medications before your next appointment, please call your pharmacy.

## 2017-12-24 ENCOUNTER — Other Ambulatory Visit: Payer: Self-pay | Admitting: Family Medicine

## 2017-12-24 DIAGNOSIS — K2100 Gastro-esophageal reflux disease with esophagitis, without bleeding: Secondary | ICD-10-CM

## 2017-12-27 ENCOUNTER — Telehealth: Payer: Self-pay | Admitting: Family Medicine

## 2017-12-27 DIAGNOSIS — K05219 Aggressive periodontitis, localized, unspecified severity: Secondary | ICD-10-CM | POA: Diagnosis not present

## 2017-12-27 NOTE — Telephone Encounter (Signed)
Will route to PCP for review. 

## 2017-12-27 NOTE — Telephone Encounter (Signed)
Patient would like antibiotics because he was eating something and now has an abscess growing in his mouth. Please follow up.

## 2017-12-31 LAB — CUP PACEART REMOTE DEVICE CHECK
Battery Remaining Longevity: 40 mo
Brady Statistic RV Percent Paced: 1 %
HighPow Impedance: 52 Ohm
Implantable Lead Implant Date: 20101111
Implantable Lead Location: 753860
Implantable Lead Model: 7121
Implantable Pulse Generator Implant Date: 20101111
Lead Channel Pacing Threshold Amplitude: 1 V
Lead Channel Pacing Threshold Pulse Width: 0.5 ms
Lead Channel Setting Pacing Pulse Width: 0.5 ms
Lead Channel Setting Sensing Sensitivity: 0.5 mV
MDC IDC MSMT BATTERY REMAINING PERCENTAGE: 33 %
MDC IDC MSMT BATTERY VOLTAGE: 2.87 V
MDC IDC MSMT LEADCHNL RV IMPEDANCE VALUE: 490 Ohm
MDC IDC MSMT LEADCHNL RV SENSING INTR AMPL: 12 mV
MDC IDC PG SERIAL: 743367
MDC IDC SESS DTM: 20190828072120
MDC IDC SET LEADCHNL RV PACING AMPLITUDE: 2.5 V

## 2018-01-03 ENCOUNTER — Ambulatory Visit: Payer: Medicare Other | Admitting: Gastroenterology

## 2018-01-24 ENCOUNTER — Ambulatory Visit: Payer: Medicare Other | Admitting: Family Medicine

## 2018-02-05 ENCOUNTER — Ambulatory Visit: Payer: Medicare Other | Admitting: Gastroenterology

## 2018-02-27 ENCOUNTER — Ambulatory Visit (INDEPENDENT_AMBULATORY_CARE_PROVIDER_SITE_OTHER): Payer: Medicare Other

## 2018-02-27 DIAGNOSIS — I4901 Ventricular fibrillation: Secondary | ICD-10-CM

## 2018-02-27 DIAGNOSIS — I5022 Chronic systolic (congestive) heart failure: Secondary | ICD-10-CM

## 2018-02-27 NOTE — Progress Notes (Signed)
Remote ICD transmission.   

## 2018-03-05 ENCOUNTER — Encounter: Payer: Self-pay | Admitting: Cardiovascular Disease

## 2018-03-05 ENCOUNTER — Ambulatory Visit (INDEPENDENT_AMBULATORY_CARE_PROVIDER_SITE_OTHER): Payer: Medicare Other | Admitting: Cardiovascular Disease

## 2018-03-05 VITALS — BP 122/78 | HR 49 | Ht 71.0 in | Wt 211.0 lb

## 2018-03-05 DIAGNOSIS — I2510 Atherosclerotic heart disease of native coronary artery without angina pectoris: Secondary | ICD-10-CM | POA: Diagnosis not present

## 2018-03-05 DIAGNOSIS — I255 Ischemic cardiomyopathy: Secondary | ICD-10-CM

## 2018-03-05 DIAGNOSIS — I739 Peripheral vascular disease, unspecified: Secondary | ICD-10-CM

## 2018-03-05 DIAGNOSIS — I5022 Chronic systolic (congestive) heart failure: Secondary | ICD-10-CM | POA: Diagnosis not present

## 2018-03-05 DIAGNOSIS — Z720 Tobacco use: Secondary | ICD-10-CM | POA: Diagnosis not present

## 2018-03-05 DIAGNOSIS — I10 Essential (primary) hypertension: Secondary | ICD-10-CM | POA: Diagnosis not present

## 2018-03-05 NOTE — Progress Notes (Signed)
Cardiology Office Note   Date:  03/05/2018   ID:  Billy [name], DOB 02/04/1966, MRN [id number]  PCP:  Charlott Rakes, MD  Cardiologist:  Dr. Stanford Breed  Chief Complaint  Patient presents with  . Follow-up    pt denied chest pain and SOB      History of Present Illness: Billy [name] is a 52 y.o. male who is here today for follow-up visit regarding peripheral arterial disease.  The patient had previous cardiac arrest in November 2010.  He had previous CABG.  He had an ICD placement done.  Most recent ischemic cardiac evaluation in May showed an EF of 30% with evidence of inferior wall MI with mild peri-infarct ischemia.  Other medical problems include hypertension, tobacco use , PAD and hyperlipidemia.  He had previous left SFA stent many years ago.   The patient was seen recently for severe right calf claudication.  noninvasive vascular evaluation showed an ABI of 0.63 on the right and 0.98 on the left.  Duplex showed patent left SFA stent.  On the right side, there was moderate disease affecting the SFA.  Monophasic waveforms were noted starting in the common femoral artery.  He is not diabetic.   The patient was instructed to quit smoking and start a walking exercise program but he has not been consistent in doing that.  He reports that he rarely smokes now but there are people who smoke around him.  He reports that the right calf claudication is stable since last visit.  No rest pain.   Past Medical History:  Diagnosis Date  . Blood in stool   . Cardiac arrest - ventricular fibrillation 02/11/2009   a. s/p ICD  . CHF (congestive heart failure) ([place])   . Coronary artery disease    s/p CABG  . Hyperlipidemia   . Hypertension   . Ischemic cardiomyopathy   . Marijuana abuse   . Myocardial infarct ([name])    NON-ST-SEGMENT ELEVATION MI  . Peripheral vascular disease, unspecified ([place])     Past Surgical History:  Procedure Laterality Date  . CARDIAC DEFIBRILLATOR PLACEMENT      STJ single chamber ICD implanted for secondary prevention  . CIRCUMCISION    . CORONARY ARTERY BYPASS GRAFT  06/17/01   X 4     Current Outpatient Medications  Medication Sig Dispense Refill  . acetaminophen (TYLENOL) 500 MG tablet Take 1,000 mg by mouth every 8 (eight) hours as needed (pain).    [name] aspirin 81 MG EC tablet Take 81 mg by mouth every morning.     [name] atorvastatin (LIPITOR) 80 MG tablet Take 1 tablet (80 mg total) by mouth daily. 90 tablet 1  . carvedilol (COREG) 25 MG tablet Take 1 tablet (25 mg total) by mouth 2 (two) times daily. 180 tablet 1  . dicyclomine (BENTYL) 20 MG tablet Take 1 tablet (20 mg total) by mouth 2 (two) times daily. 180 tablet 1  . esomeprazole (NEXIUM) 40 MG capsule TAKE 1 CAPSULE BY MOUTH EVERY DAY 90 capsule 0  . furosemide (LASIX) 20 MG tablet Take 1 tablet (20 mg total) by mouth daily. 90 tablet 1  . lisinopril-hydrochlorothiazide (PRINZIDE,ZESTORETIC) 20-25 MG tablet Take 1 tablet by mouth daily. 90 tablet 3  . montelukast (SINGULAIR) 10 MG tablet Take 1 tablet (10 mg total) by mouth at bedtime. 90 tablet 1  . Multiple Vitamin (MULTIVITAMIN) tablet Take 1 tablet by mouth every morning.     . ondansetron (ZOFRAN) 4 MG tablet  Take 1 tablet (4 mg total) by mouth every 8 (eight) hours as needed for nausea or vomiting. 30 tablet 1  . potassium chloride SA (KLOR-CON M20) 20 MEQ tablet Take 1 tablet (20 mEq total) by mouth daily. 90 tablet 1  . sucralfate (CARAFATE) 1 g tablet Take 1 tablet (1 g total) by mouth 4 (four) times daily -  with meals and at bedtime. 120 tablet 3  . ezetimibe (ZETIA) 10 MG tablet Take 1 tablet (10 mg total) by mouth daily. 90 tablet 1  . isosorbide mononitrate (IMDUR) 30 MG 24 hr tablet Take 1 tablet (30 mg total) by mouth daily. 90 tablet 1  . nitroGLYCERIN (NITROSTAT) 0.4 MG SL tablet Place 1 tablet (0.4 mg total) under the tongue every 5 (five) minutes as needed for chest pain. 25 tablet 3   Current Facility-Administered  Medications  Medication Dose Route Frequency Provider Last Rate Last Dose  . sacubitril-valsartan (ENTRESTO) 24-26 mg per tablet  1 tablet Oral BID Lelon Perla, MD        Allergies:   Patient has no known allergies.    Social History:  The patient  reports that he quit smoking about 14 years ago. He has a 40.00 pack-year smoking history. He has never used smokeless tobacco. He reports that he does not drink alcohol or use drugs.   Family History:  The patient's family history includes Heart attack in his father; Heart disease in his sister; Hypertension in his mother and unknown relative.    ROS:  Please see the history of present illness.   Otherwise, review of systems are positive for none.   All other systems are reviewed and negative.    PHYSICAL EXAM: VS:  BP 122/78   Pulse (!) 49   Ht 5\' 11"  (1.803 m)   Wt 211 lb (95.7 kg)   BMI 29.43 kg/m  , BMI Body mass index is 29.43 kg/m. GEN: Well nourished, well developed, in no acute distress  HEENT: normal  Neck: no JVD, carotid bruits, or masses Cardiac: RRR; no murmurs, rubs, or gallops,no edema  Respiratory:  clear to auscultation bilaterally, normal work of breathing GI: soft, nontender, nondistended, + BS MS: no deformity or atrophy  Skin: warm and dry, no rash Neuro:  Strength and sensation are intact Psych: euthymic mood, full affect Vascular: Femoral pulses +1 on the right and +2 on the left.    EKG:  EKG is ordered today. EKG showed sinus bradycardia with possible left atrial enlargement   Recent Labs: 07/03/2017: BUN <5; Creatinine, Ser 1.03; Hemoglobin 16.7; Platelets 169; Potassium 3.9; Sodium 137 10/18/2017: ALT 18    Lipid Panel    Component Value Date/Time   CHOL 142 10/18/2017 0836   TRIG 58 10/18/2017 0836   HDL 46 10/18/2017 0836   CHOLHDL 3.1 10/18/2017 0836   CHOLHDL 3.1 12/07/2015 0739   VLDL 19 12/07/2015 0739   LDLCALC 84 10/18/2017 0836      Wt Readings from Last 3 Encounters:    03/05/18 211 lb (95.7 kg)  12/04/17 213 lb (96.6 kg)  11/08/17 209 lb 6.4 oz (95 kg)       No flowsheet data found.    ASSESSMENT AND PLAN:  1.  Peripheral arterial disease with severe right leg claudication Rutherford class III: ABI was moderately reduced at 0.63.  Possible inflow disease on the right side.  His symptoms are severe but the patient does not feel that they are lifestyle limiting.  He  prefers to continue medical therapy for now over angiography and endovascular intervention.  2.  Coronary artery disease involving native coronary arteries without angina: Continue medical therapy.  3.  Chronic systolic heart failure: He appears to be euvolemic and currently on optimal medical therapy.  4.  Hyperlipidemia: Continue treatment with atorvastatin and Zetia with a target LDL of less than 70.  5.  Tobacco use: I again discussed with him the importance of smoking cessation.    Disposition:   FU with me in 6 months  Signed,  [name], MD  03/05/2018 8:26 AM    [place]

## 2018-03-05 NOTE — Patient Instructions (Signed)
Medication Instructions:  Your physician recommends that you continue on your current medications as directed. Please refer to the Current Medication list given to you today.  If you need a refill on your cardiac medications before your next appointment, please call your pharmacy.   Lab work: none If you have labs (blood work) drawn today and your tests are completely normal, you will receive your results only by: [name] MyChart Message (if you have MyChart) OR . A paper copy in the mail If you have any lab test that is abnormal or we need to change your treatment, we will call you to review the results.  Testing/Procedures: none  Follow-Up: At [facility], you and your health needs are our priority.  As part of our continuing mission to provide you with exceptional heart care, we have created designated Provider Care Teams.  These Care Teams include your primary Cardiologist (physician) and Advanced Practice Providers (APPs -  Physician Assistants and Nurse Practitioners) who all work together to provide you with the care you need, when you need it. You will need a follow up appointment in 6 months.  Please call our office 2 months in advance to schedule this appointment.  You may see Dr. Aundria Rud or one of the following Advanced Practice Providers on your designated Care Team:   Kerin Ransom, PA-C Roby Lofts, [place] . Sande Rives, PA-C  Any Other Special Instructions Will Be Listed Below (If Applicable).

## 2018-03-19 ENCOUNTER — Ambulatory Visit: Payer: Medicare Other | Admitting: Family Medicine

## 2018-03-20 ENCOUNTER — Other Ambulatory Visit: Payer: Self-pay | Admitting: Family Medicine

## 2018-03-20 DIAGNOSIS — K2100 Gastro-esophageal reflux disease with esophagitis, without bleeding: Secondary | ICD-10-CM

## 2018-03-31 ENCOUNTER — Other Ambulatory Visit: Payer: Self-pay | Admitting: Family Medicine

## 2018-03-31 DIAGNOSIS — K580 Irritable bowel syndrome with diarrhea: Secondary | ICD-10-CM

## 2018-04-04 ENCOUNTER — Encounter: Payer: Self-pay | Admitting: Internal Medicine

## 2018-04-08 ENCOUNTER — Ambulatory Visit: Payer: Medicare Other | Admitting: Family Medicine

## 2018-04-16 ENCOUNTER — Other Ambulatory Visit: Payer: Self-pay | Admitting: Family Medicine

## 2018-04-16 DIAGNOSIS — I10 Essential (primary) hypertension: Secondary | ICD-10-CM

## 2018-04-19 LAB — CUP PACEART REMOTE DEVICE CHECK
Battery Remaining Longevity: 37 mo
Battery Voltage: 2.87 V
HighPow Impedance: 53 Ohm
Implantable Lead Implant Date: 20101111
Implantable Lead Location: 753860
Implantable Lead Model: 7121
Implantable Pulse Generator Implant Date: 20101111
Lead Channel Impedance Value: 490 Ohm
Lead Channel Pacing Threshold Amplitude: 1 V
Lead Channel Pacing Threshold Pulse Width: 0.5 ms
Lead Channel Sensing Intrinsic Amplitude: 12 mV
Lead Channel Setting Pacing Amplitude: 2.5 V
Lead Channel Setting Pacing Pulse Width: 0.5 ms
Lead Channel Setting Sensing Sensitivity: 0.5 mV
MDC IDC MSMT BATTERY REMAINING PERCENTAGE: 32 %
MDC IDC SESS DTM: 20191127075240
MDC IDC STAT BRADY RV PERCENT PACED: 1 %
Pulse Gen Serial Number: 743367

## 2018-04-26 ENCOUNTER — Other Ambulatory Visit: Payer: Self-pay | Admitting: Family Medicine

## 2018-04-26 DIAGNOSIS — K2100 Gastro-esophageal reflux disease with esophagitis, without bleeding: Secondary | ICD-10-CM

## 2018-04-30 ENCOUNTER — Encounter: Payer: Self-pay | Admitting: Internal Medicine

## 2018-04-30 ENCOUNTER — Ambulatory Visit (INDEPENDENT_AMBULATORY_CARE_PROVIDER_SITE_OTHER): Payer: Medicare Other | Admitting: Internal Medicine

## 2018-04-30 VITALS — BP 112/76 | HR 66 | Ht 71.0 in | Wt 210.6 lb

## 2018-04-30 DIAGNOSIS — I25118 Atherosclerotic heart disease of native coronary artery with other forms of angina pectoris: Secondary | ICD-10-CM

## 2018-04-30 DIAGNOSIS — Z95810 Presence of automatic (implantable) cardiac defibrillator: Secondary | ICD-10-CM | POA: Diagnosis not present

## 2018-04-30 DIAGNOSIS — I4901 Ventricular fibrillation: Secondary | ICD-10-CM

## 2018-04-30 DIAGNOSIS — I255 Ischemic cardiomyopathy: Secondary | ICD-10-CM | POA: Diagnosis not present

## 2018-04-30 NOTE — Patient Instructions (Addendum)
Medication Instructions:  Your physician recommends that you continue on your current medications as directed. Please refer to the Current Medication list given to you today.  Labwork: You will have labs drawn today: BMP   Testing/Procedures: None ordered.  Follow-Up: Your physician recommends that you schedule a follow-up appointment in:   One Year with Dr Caryl Comes  Any Other Special Instructions Will Be Listed Below (If Applicable).     If you need a refill on your cardiac medications before your next appointment, please call your pharmacy.

## 2018-04-30 NOTE — Progress Notes (Signed)
Patient Care Team: Charlott Rakes, MD as PCP - General (Family Medicine)   HPI  Billy [name] is a 53 y.o. male Seen in followup for an ICD implanted following a cardiac arrest fall 2011. His history of ischemic heart disease and prior bypass grafting and had declined ICD implantation prior to his arrest.   10/16 he had intercurrent ICD shock-appropriate VF  DATE TEST EF   12/09 cMRI  35 % Large infarct  5/19 MYOVIEW  30 % Large infarct        Started on Entresto 8/19 but was unable to tolerate.  Date Cr K Hgb  7/17 1.17 4.2 15.2  4/19 1.03 3.9 16.7     The patient denies chest pain, shortness of breath, nocturnal dyspnea, orthopnea or peripheral edema.  There have been no lightheadedness or syncope.   Some claudication   Past Medical History:  Diagnosis Date  . Blood in stool   . Cardiac arrest - ventricular fibrillation 02/11/2009   a. s/p ICD  . CHF (congestive heart failure) ([place])   . Coronary artery disease    s/p CABG  . Hyperlipidemia   . Hypertension   . Ischemic cardiomyopathy   . Marijuana abuse   . Myocardial infarct ([name])    NON-ST-SEGMENT ELEVATION MI  . Peripheral vascular disease, unspecified ([place]     Past Surgical History:  Procedure Laterality Date  . CARDIAC DEFIBRILLATOR PLACEMENT     STJ single chamber ICD implanted for secondary prevention  . CIRCUMCISION    . CORONARY ARTERY BYPASS GRAFT  06/17/01   X 4    Current Outpatient Medications  Medication Sig Dispense Refill  . acetaminophen (TYLENOL) 500 MG tablet Take 1,000 mg by mouth every 8 (eight) hours as needed (pain).    [name] aspirin 81 MG EC tablet Take 81 mg by mouth every morning.     [name] atorvastatin (LIPITOR) 80 MG tablet Take 1 tablet (80 mg total) by mouth daily. 90 tablet 1  . carvedilol (COREG) 25 MG tablet Take 1 tablet (25 mg total) by mouth 2 (two) times daily. MUST MAKE APPT FOR FURTHER REFILLS 60 tablet 0  . dicyclomine (BENTYL) 20 MG tablet TAKE 1 TABLET BY  MOUTH TWICE A DAY 180 tablet 1  . esomeprazole (NEXIUM) 40 MG capsule TAKE 1 CAPSULE BY MOUTH EVERY DAY 30 capsule 0  . ezetimibe (ZETIA) 10 MG tablet Take 1 tablet (10 mg total) by mouth daily. 90 tablet 1  . furosemide (LASIX) 20 MG tablet Take 1 tablet (20 mg total) by mouth daily. 90 tablet 1  . isosorbide mononitrate (IMDUR) 30 MG 24 hr tablet Take 1 tablet (30 mg total) by mouth daily. 90 tablet 1  . lisinopril-hydrochlorothiazide (PRINZIDE,ZESTORETIC) 20-25 MG tablet Take 1 tablet by mouth daily. 90 tablet 3  . montelukast (SINGULAIR) 10 MG tablet Take 1 tablet (10 mg total) by mouth at bedtime. 90 tablet 1  . Multiple Vitamin (MULTIVITAMIN) tablet Take 1 tablet by mouth every morning.     . nitroGLYCERIN (NITROSTAT) 0.4 MG SL tablet Place 1 tablet (0.4 mg total) under the tongue every 5 (five) minutes as needed for chest pain. 25 tablet 3  . ondansetron (ZOFRAN) 4 MG tablet Take 1 tablet (4 mg total) by mouth every 8 (eight) hours as needed for nausea or vomiting. 30 tablet 1  . potassium chloride SA (KLOR-CON M20) 20 MEQ tablet Take 1 tablet (20 mEq total) by mouth daily. 90 tablet 1  .  sucralfate (CARAFATE) 1 g tablet Take 1 tablet (1 g total) by mouth 4 (four) times daily -  with meals and at bedtime. 120 tablet 3   Current Facility-Administered Medications  Medication Dose Route Frequency Provider Last Rate Last Dose  . sacubitril-valsartan (ENTRESTO) 24-26 mg per tablet  1 tablet Oral BID Lelon Perla, MD        No Known Allergies  Review of Systems negative except from HPI and PMH  Physical Exam BP 112/76   Pulse 66   Ht 5\' 11"  (1.803 m)   Wt 210 lb 9.6 oz (95.5 kg)   SpO2 97%   BMI 29.37 kg/m  Well developed and nourished in no acute distress HENT normal Neck supple with JVP-flat Clear Device pocket well healed; without hematoma or erythema.  There is no tethering  Regular rate and rhythm, no murmurs or gallops Abd-soft with active BS No Clubbing cyanosis  edema Skin-warm and dry A & Oriented  Grossly normal sensory and motor function   ECG sinus 66 13/09/42 PVCs    Assessment and  Plan  Aborted cardiac arrest  No intercurrent Ventricular tachycardia  Ischemic heart disease with prior bypass surgery  Hyperlipidemia  Defibrillator-[employer] The patient's device was interrogated.  The information was reviewed. No changes were made in the programming.       Without symptoms of ischemia  PVVC burden not excessive based on histogram

## 2018-05-01 LAB — BASIC METABOLIC PANEL
BUN/Creatinine Ratio: 15 (ref 9–20)
BUN: 18 mg/dL (ref 6–24)
CO2: 21 mmol/L (ref 20–29)
Calcium: 9.7 mg/dL (ref 8.7–10.2)
Chloride: 98 mmol/L (ref 96–106)
Creatinine, Ser: 1.24 mg/dL (ref 0.76–1.27)
GFR calc Af Amer: 77 mL/min/{1.73_m2} (ref >59)
GFR calc non Af Amer: 67 mL/min/{1.73_m2} (ref >59)
GLUCOSE: 74 mg/dL (ref 65–99)
Potassium: 4.2 mmol/L (ref 3.5–5.2)
Sodium: 139 mmol/L (ref 134–144)

## 2018-05-03 ENCOUNTER — Other Ambulatory Visit: Payer: Self-pay | Admitting: Family Medicine

## 2018-05-03 DIAGNOSIS — E7800 Pure hypercholesterolemia, unspecified: Secondary | ICD-10-CM

## 2018-05-13 ENCOUNTER — Other Ambulatory Visit: Payer: Self-pay | Admitting: Family Medicine

## 2018-05-13 DIAGNOSIS — K2100 Gastro-esophageal reflux disease with esophagitis, without bleeding: Secondary | ICD-10-CM

## 2018-05-13 DIAGNOSIS — I10 Essential (primary) hypertension: Secondary | ICD-10-CM

## 2018-05-13 DIAGNOSIS — E7800 Pure hypercholesterolemia, unspecified: Secondary | ICD-10-CM

## 2018-05-18 ENCOUNTER — Emergency Department (HOSPITAL_COMMUNITY)
Admission: EM | Admit: 2018-05-18 | Discharge: 2018-05-18 | Disposition: A | Discharge status: Home / Self Care | Payer: Medicare Other | Attending: Emergency Medicine | Admitting: Emergency Medicine

## 2018-05-18 ENCOUNTER — Encounter (HOSPITAL_COMMUNITY): Payer: Self-pay | Admitting: Emergency Medicine

## 2018-05-18 ENCOUNTER — Other Ambulatory Visit: Payer: Self-pay

## 2018-05-18 DIAGNOSIS — I252 Old myocardial infarction: Secondary | ICD-10-CM | POA: Insufficient documentation

## 2018-05-18 DIAGNOSIS — K029 Dental caries, unspecified: Secondary | ICD-10-CM | POA: Insufficient documentation

## 2018-05-18 DIAGNOSIS — Z8674 Personal history of sudden cardiac arrest: Secondary | ICD-10-CM | POA: Insufficient documentation

## 2018-05-18 DIAGNOSIS — I2510 Atherosclerotic heart disease of native coronary artery without angina pectoris: Secondary | ICD-10-CM | POA: Diagnosis not present

## 2018-05-18 DIAGNOSIS — I509 Heart failure, unspecified: Secondary | ICD-10-CM | POA: Diagnosis not present

## 2018-05-18 DIAGNOSIS — Z87891 Personal history of nicotine dependence: Secondary | ICD-10-CM | POA: Diagnosis not present

## 2018-05-18 DIAGNOSIS — Z79899 Other long term (current) drug therapy: Secondary | ICD-10-CM | POA: Insufficient documentation

## 2018-05-18 DIAGNOSIS — I739 Peripheral vascular disease, unspecified: Secondary | ICD-10-CM | POA: Insufficient documentation

## 2018-05-18 DIAGNOSIS — I10 Essential (primary) hypertension: Secondary | ICD-10-CM | POA: Diagnosis not present

## 2018-05-18 DIAGNOSIS — Z951 Presence of aortocoronary bypass graft: Secondary | ICD-10-CM | POA: Insufficient documentation

## 2018-05-18 DIAGNOSIS — K047 Periapical abscess without sinus: Secondary | ICD-10-CM | POA: Insufficient documentation

## 2018-05-18 MED ORDER — LIDOCAINE HCL (PF) 1 % IJ SOLN
30.0000 mL | Freq: Once | INTRAMUSCULAR | Status: AC
  Administered 2018-05-18: 30 mL
  Filled 2018-05-18: qty 30

## 2018-05-18 MED ORDER — PENICILLIN V POTASSIUM 500 MG PO TABS: 500.0000 mg | ORAL_TABLET | Freq: Four times a day (QID) | ORAL | 0 refills | Status: AC

## 2018-05-18 NOTE — ED Triage Notes (Signed)
Pt here with c/o dental pain/abscess that began last week. Pt reports pain 6/10.

## 2018-05-18 NOTE — ED Provider Notes (Signed)
[facility] EMERGENCY DEPARTMENT Provider Note   CSN: [id number] Arrival date & time: 05/18/18  0909     History   Chief Complaint No chief complaint on file.   HPI Billy [name] is a 53 y.o. male with history of CHF, hypertension, ischemic cardiomyopathy presenting to emergency department with chief complaint of dental abscess x 1 week.  Patient reports 6 months ago he was eating a ham sandwich and when took a bite was a piece bone causing him to loosen his left central incisor. Patient reports the pain is throbbing and constant.  The pain does not radiate he rates it 6 out of 10 in severity.  He took ibuprofen and Tylenol without relief.  Patient reports history of abscess in this area, estimating last one was 5 months ago that he had drained at urgent care without complications. Denies fever, as of breath, changes in voice, difficulty swallowing.   Past Medical History:  Diagnosis Date  . Blood in stool   . Cardiac arrest - ventricular fibrillation 02/11/2009   a. s/p ICD  . CHF (congestive heart failure) ([place])   . Coronary artery disease    s/p CABG  . Hyperlipidemia   . Hypertension   . Ischemic cardiomyopathy   . Marijuana abuse   . Myocardial infarct ([name])    NON-ST-SEGMENT ELEVATION MI  . Peripheral vascular disease, unspecified [facility])     Patient Active Problem List   Diagnosis Date Noted  . Irritable bowel syndrome 08/23/2017  . Seasonal allergies 04/19/2017  . S/P quadruple vessel bypass 04/19/2017  . Prediabetes 04/19/2017  . Dysuria 06/06/2016  . GERD (gastroesophageal reflux disease) 01/31/2016  . Implantable cardioverter-defibrillator (ICD) in situ 06/12/2011  . VENTRICULAR FIBRILLATION 03/02/2009  . Cardiomyopathy, ischemic 08/25/2008  . Hyperlipidemia 01/15/2008  . Essential hypertension 01/15/2008  . CORONARY ARTERY DISEASE 01/15/2008  . PERIPHERAL VASCULAR DISEASE 01/15/2008  . HEMATOCHEZIA 01/15/2008  . CHEST PAIN 01/15/2008     Past Surgical History:  Procedure Laterality Date  . CARDIAC DEFIBRILLATOR PLACEMENT     STJ single chamber ICD implanted for secondary prevention  . CIRCUMCISION    . CORONARY ARTERY BYPASS GRAFT  06/17/01   X 4        Home Medications    Prior to Admission medications   Medication Sig Start Date End Date Taking? Authorizing Provider  acetaminophen (TYLENOL) 500 MG tablet Take 1,000 mg by mouth every 8 (eight) hours as needed (pain).    [provider]  aspirin 81 MG EC tablet Take 81 mg by mouth every morning.     [provider]  atorvastatin (LIPITOR) 80 MG tablet Take 1 tablet (80 mg total) by mouth daily. 10/03/17   Charlott Rakes, MD  carvedilol (COREG) 25 MG tablet Take 1 tablet (25 mg total) by mouth 2 (two) times daily. MUST MAKE APPT FOR FURTHER REFILLS 04/17/18   Charlott Rakes, MD  dicyclomine (BENTYL) 20 MG tablet TAKE 1 TABLET BY MOUTH TWICE A DAY 04/01/18   Charlott Rakes, MD  esomeprazole (NEXIUM) 40 MG capsule TAKE 1 CAPSULE BY MOUTH EVERY DAY 03/22/18   Charlott Rakes, MD  ezetimibe (ZETIA) 10 MG tablet Take 1 tablet (10 mg total) by mouth daily. 08/23/17   Charlott Rakes, MD  furosemide (LASIX) 20 MG tablet Take 1 tablet (20 mg total) by mouth daily. 08/23/17   Charlott Rakes, MD  isosorbide mononitrate (IMDUR) 30 MG 24 hr tablet Take 1 tablet (30 mg total) by mouth daily. 08/23/17  Charlott Rakes, MD  lisinopril-hydrochlorothiazide (PRINZIDE,ZESTORETIC) 20-25 MG tablet Take 1 tablet by mouth daily. 11/20/17   Lelon Perla, MD  montelukast (SINGULAIR) 10 MG tablet Take 1 tablet (10 mg total) by mouth at bedtime. 08/23/17   Charlott Rakes, MD  Multiple Vitamin (MULTIVITAMIN) tablet Take 1 tablet by mouth every morning.     [provider]  nitroGLYCERIN (NITROSTAT) 0.4 MG SL tablet Place 1 tablet (0.4 mg total) under the tongue every 5 (five) minutes as needed for chest pain. 05/02/17   Baldwin Jamaica, PA-C  ondansetron (ZOFRAN)  4 MG tablet Take 1 tablet (4 mg total) by mouth every 8 (eight) hours as needed for nausea or vomiting. 08/23/17   Charlott Rakes, MD  penicillin v potassium (VEETID) 500 MG tablet Take 1 tablet (500 mg total) by mouth 4 (four) times daily for 10 days. 05/18/18 05/28/18  Albrizze, Kaitlyn E, PA-C  potassium chloride SA (KLOR-CON M20) 20 MEQ tablet Take 1 tablet (20 mEq total) by mouth daily. 08/23/17   Charlott Rakes, MD  sucralfate (CARAFATE) 1 g tablet Take 1 tablet (1 g total) by mouth 4 (four) times daily -  with meals and at bedtime. 07/03/16   Nche, Charlene Brooke, NP    Family History Family History  Problem Relation Age of Onset  . Hypertension Mother   . Heart attack Father        DIED AT 51 FROM MI  . Heart disease Sister        CAD AND PREVIOUS CABG  . Hypertension Other        IN MOST OF HIS SIBLINGS    Social History Social History   Tobacco Use  . Smoking status: Former Smoker    Packs/day: 2.00    Years: 20.00    Pack years: 40.00    Last attempt to quit: 04/04/2003    Years since quitting: 15.1  . Smokeless tobacco: Never Used  Substance Use Topics  . Alcohol use: No  . Drug use: No     Allergies   Patient has no known allergies.   Review of Systems Review of Systems  HENT: Positive for dental problem.   All other systems reviewed and are negative.    Physical Exam Updated Vital Signs BP 122/78 (BP Location: Right Arm)   Pulse 64   Temp 97.6 F (36.4 C) (Oral)   Resp 16   SpO2 98%   Physical Exam Constitutional:      Appearance: Normal appearance. He is well-developed.  HENT:     Head: Normocephalic and atraumatic.     Comments: Upper left incisor decayed. There is a pointing abscess visible to buccal surface adjacent to tooth with coincident external swelling of lip. No active drainage.  Posterior oropharynx clear with no signs of infection, uvula is midline and rises with phonation. Full active range of motion of the jaw. Neck is supple with  full active range of motion, no tenderness to palpation of the soft tissues.  Eyes:     General:        Right eye: No discharge.        Left eye: No discharge.     Conjunctiva/sclera: Conjunctivae normal.  Pulmonary:     Effort: Pulmonary effort is normal.  Skin:    General: Skin is warm and dry.  Neurological:     Mental Status: He is alert.  Psychiatric:        Behavior: Behavior normal.  ED Treatments / Results  Labs (all labs ordered are listed, but only abnormal results are displayed) Labs Reviewed - No data to display  EKG None  Radiology No results found.  Procedures .Marland KitchenIncision and Drainage Date/Time: 05/18/2018 11:26 AM Performed by: Cherre Robins, PA-C Authorized by: Cherre Robins, PA-C   Consent:    Consent obtained:  Verbal   Consent given by:  Patient   Risks discussed:  Bleeding, incomplete drainage, pain, infection and damage to other organs   Alternatives discussed:  No treatment Location:    Type:  Abscess   Size:  1.5   Location: upper incisor dental abscess. Anesthesia (see MAR for exact dosages):    Anesthesia method:  Local infiltration   Local anesthetic:  Lidocaine 1% w/o epi Procedure type:    Complexity:  Simple Procedure details:    Needle aspiration: no     Incision types:  Single straight   Scalpel blade:  11   Drainage:  Purulent   Drainage amount:  Moderate   Wound treatment:  Wound left open   Packing materials:  None Post-procedure details:    Patient tolerance of procedure:  Tolerated well, no immediate complications   (including critical care time)  Medications Ordered in ED Medications  lidocaine (PF) (XYLOCAINE) 1 % injection 30 mL (30 mLs Infiltration Given by Other 05/18/18 1048)     Initial Impression / Assessment and Plan / ED Course  I have reviewed the triage vital signs and the nursing notes.  Pertinent labs & imaging results that were available during my care of the patient were reviewed by  me and considered in my medical decision making (see chart for details).    Pt is well appearing, afebrile.  Chronic dental decay. Patient with dental abscess.  Patient tolerated I&D, see note above. Exam unconcerning for Ludwig's angina or spread of infection.  Will treat with penicillin and anti-inflammatories medicine.  Urged patient to follow-up with dentist.  VSS.   Patient's spouse called [employer] [facility] to arrange follow-up today.  They have called dentists in the area but have been unable to afford treatment.  After discharge they are driving to [place] school to be seen. Pt appears stable for d/c. Discussed strict ED return precautions. Pt verbalized understanding of and is in agreement with this plan.    Final Clinical Impressions(s) / ED Diagnoses   Final diagnoses:  Dental abscess    ED Discharge Orders         Ordered    penicillin v potassium (VEETID) 500 MG tablet  4 times daily     05/18/18 1114           Albrizze, Erie Noe 05/18/18 1134    Gareth Morgan, MD 05/18/18 2217

## 2018-05-18 NOTE — Discharge Instructions (Signed)
Prescription for penicillin sent to your pharmacy. Take antibiotic until completion. Apply warm compresses to jaw throughout the day.    Follow up with a dentist is very important for ongoing evaluation and management of recurrent dental pain. Return to emergency department for emergent changing or worsening symptoms.

## 2018-05-18 NOTE — ED Notes (Signed)
Discharge instructions (including medications) discussed with and copy provided to patient/caregiver.  Pt verbalizes understanding of d/c instructions.    

## 2018-05-18 NOTE — ED Notes (Signed)
I&D tray placed at the bedside along with the Lidocaine.

## 2018-05-21 ENCOUNTER — Other Ambulatory Visit: Payer: Self-pay | Admitting: *Deleted

## 2018-05-21 ENCOUNTER — Other Ambulatory Visit: Payer: Self-pay | Admitting: Family Medicine

## 2018-05-21 DIAGNOSIS — I10 Essential (primary) hypertension: Secondary | ICD-10-CM

## 2018-05-21 DIAGNOSIS — Z951 Presence of aortocoronary bypass graft: Secondary | ICD-10-CM

## 2018-05-21 DIAGNOSIS — E7800 Pure hypercholesterolemia, unspecified: Secondary | ICD-10-CM

## 2018-05-21 MED ORDER — ATORVASTATIN CALCIUM 80 MG PO TABS: 80.0000 mg | ORAL_TABLET | Freq: Every day | ORAL | 0 refills | Status: DC

## 2018-05-21 MED ORDER — FUROSEMIDE 20 MG PO TABS: 20.0000 mg | ORAL_TABLET | Freq: Every day | ORAL | 0 refills | Status: DC

## 2018-05-21 MED ORDER — ISOSORBIDE MONONITRATE ER 30 MG PO TB24: 30.0000 mg | ORAL_TABLET | Freq: Every day | ORAL | 0 refills | Status: DC

## 2018-05-21 MED ORDER — EZETIMIBE 10 MG PO TABS: 10.0000 mg | ORAL_TABLET | Freq: Every day | ORAL | 0 refills | Status: DC

## 2018-05-21 MED ORDER — CARVEDILOL 25 MG PO TABS: 25.0000 mg | ORAL_TABLET | Freq: Two times a day (BID) | ORAL | 0 refills | Status: DC

## 2018-05-25 ENCOUNTER — Other Ambulatory Visit: Payer: Self-pay | Admitting: Family Medicine

## 2018-05-29 ENCOUNTER — Ambulatory Visit (INDEPENDENT_AMBULATORY_CARE_PROVIDER_SITE_OTHER): Payer: Medicare Other | Admitting: *Deleted

## 2018-05-29 DIAGNOSIS — I255 Ischemic cardiomyopathy: Secondary | ICD-10-CM

## 2018-05-29 DIAGNOSIS — Z95810 Presence of automatic (implantable) cardiac defibrillator: Secondary | ICD-10-CM

## 2018-05-31 ENCOUNTER — Other Ambulatory Visit: Payer: Self-pay | Admitting: Cardiology

## 2018-05-31 DIAGNOSIS — K2100 Gastro-esophageal reflux disease with esophagitis, without bleeding: Secondary | ICD-10-CM

## 2018-05-31 DIAGNOSIS — I10 Essential (primary) hypertension: Secondary | ICD-10-CM

## 2018-05-31 DIAGNOSIS — Z951 Presence of aortocoronary bypass graft: Secondary | ICD-10-CM

## 2018-05-31 DIAGNOSIS — E7800 Pure hypercholesterolemia, unspecified: Secondary | ICD-10-CM

## 2018-05-31 LAB — CUP PACEART REMOTE DEVICE CHECK
Battery Remaining Longevity: 37 mo
Battery Remaining Percentage: 31 %
Battery Voltage: 2.87 V
Brady Statistic RV Percent Paced: 1 %
Date Time Interrogation Session: 20200226082546
HighPow Impedance: 59 Ohm
Implantable Lead Implant Date: 20101111
Implantable Lead Location: 753860
Implantable Pulse Generator Implant Date: 20101111
Lead Channel Impedance Value: 530 Ohm
Lead Channel Pacing Threshold Amplitude: 1 V
Lead Channel Pacing Threshold Pulse Width: 0.5 ms
Lead Channel Sensing Intrinsic Amplitude: 12 mV
Lead Channel Setting Pacing Amplitude: 2.5 V
Lead Channel Setting Pacing Pulse Width: 0.5 ms
Lead Channel Setting Sensing Sensitivity: 0.5 mV
Pulse Gen Serial Number: 743367

## 2018-05-31 MED ORDER — ISOSORBIDE MONONITRATE ER 30 MG PO TB24: 30.0000 mg | ORAL_TABLET | Freq: Every day | ORAL | 1 refills | Status: DC

## 2018-05-31 MED ORDER — ATORVASTATIN CALCIUM 80 MG PO TABS: 80.0000 mg | ORAL_TABLET | Freq: Every day | ORAL | 1 refills | Status: DC

## 2018-05-31 MED ORDER — EZETIMIBE 10 MG PO TABS: 10.0000 mg | ORAL_TABLET | Freq: Every day | ORAL | 1 refills | Status: DC

## 2018-05-31 MED ORDER — CARVEDILOL 25 MG PO TABS: 25.0000 mg | ORAL_TABLET | Freq: Two times a day (BID) | ORAL | 1 refills | Status: DC

## 2018-05-31 MED ORDER — LISINOPRIL-HYDROCHLOROTHIAZIDE 20-25 MG PO TABS: 1.0000 | ORAL_TABLET | Freq: Every day | ORAL | 1 refills | Status: DC

## 2018-05-31 MED ORDER — FUROSEMIDE 20 MG PO TABS: 20.0000 mg | ORAL_TABLET | Freq: Every day | ORAL | 1 refills | Status: DC

## 2018-05-31 MED ORDER — POTASSIUM CHLORIDE CRYS ER 20 MEQ PO TBCR: 20.0000 meq | EXTENDED_RELEASE_TABLET | Freq: Every day | ORAL | 1 refills | Status: DC

## 2018-05-31 MED ORDER — ESOMEPRAZOLE MAGNESIUM 40 MG PO CPDR: 40.0000 mg | DELAYED_RELEASE_CAPSULE | Freq: Every day | ORAL | 1 refills | Status: DC

## 2018-05-31 MED ORDER — NITROGLYCERIN 0.4 MG SL SUBL: 0.4000 mg | SUBLINGUAL_TABLET | SUBLINGUAL | 3 refills | Status: DC | PRN

## 2018-05-31 NOTE — Telephone Encounter (Signed)
°*  STAT* If patient is at the pharmacy, call can be transferred to refill team.   1. Which medications need to be refilled? (please list name of each medication and dose if known)  atorvastatin (LIPITOR) 80 MG tablet furosemide (LASIX) 20 MG tablet carvedilol (COREG) 25 MG tablet ezetimibe (ZETIA) 10 MG tablet potassium chloride SA (KLOR-CON M20) 20 MEQ tablet nitroGLYCERIN (NITROSTAT) 0.4 MG SL tablet isosorbide mononitrate (IMDUR) 30 MG 24 hr tablet esomeprazole (NEXIUM) 40 MG capsule lisinopril-hydrochlorothiazide (PRINZIDE,ZESTORETIC) 20-25 MG tablet   2. Which pharmacy/location (including street and city if local pharmacy) is medication to be sent to? [id number] - [place], [place] - [address]  3. Do they need a 30 day or 90 day supply? 90 day supply  Wife of pt claims she has gone back and forth with pharmacy asking for refills, and she says the refill requests never get faxed to the pharmacy.  She says the PCP is not efficient with fax requests either, and because of this she will be finding a new PCP. Wife says Dr. Stanford Breed was originally the one who prescribed all of his medications   PT is Out of Furosemide. He has enough of his other meds

## 2018-05-31 NOTE — Telephone Encounter (Signed)
Rx(s) sent to pharmacy electronically.  

## 2018-06-03 ENCOUNTER — Telehealth: Payer: Self-pay | Admitting: *Deleted

## 2018-06-03 MED ORDER — OMEPRAZOLE 40 MG PO CPDR: 40.0000 mg | DELAYED_RELEASE_CAPSULE | Freq: Every day | ORAL | 3 refills | Status: DC

## 2018-06-03 NOTE — Telephone Encounter (Signed)
Received fax from the pharmacy, nexium is no longer covered by the formulary. Spoke with pt wife, script changed to omeprazole 40 mg once daily which is an alternative per insurance.

## 2018-06-05 ENCOUNTER — Encounter: Payer: Self-pay | Admitting: Cardiology

## 2018-06-05 NOTE — Progress Notes (Signed)
Remote ICD transmission.   

## 2018-07-01 ENCOUNTER — Other Ambulatory Visit: Payer: Self-pay

## 2018-08-19 ENCOUNTER — Telehealth: Payer: Self-pay | Admitting: Cardiology

## 2018-08-19 NOTE — Progress Notes (Signed)
Virtual Visit via Video Note   This visit type was conducted due to national recommendations for restrictions regarding the COVID-19 Pandemic (e.g. social distancing) in an effort to limit this patient's exposure and mitigate transmission in our community.  Due to his co-morbid illnesses, this patient is at least at moderate risk for complications without adequate follow up.  This format is felt to be most appropriate for this patient at this time.  All issues noted in this document were discussed and addressed.  A limited physical exam was performed with this format.  Please refer to the patient's chart for his consent to telehealth for [facility].   Date:  08/20/2018   ID:  Billy [name], DOB [birth year], MRN [id number]  Patient Location: Home Provider Location: Home  PCP:  Charlott Rakes, MD  Cardiologist:  Dr Stanford Breed  Evaluation Performed:  Follow-Up Visit  Chief Complaint:  FU CAD  History of Present Illness:    FU CAD. Patient suffered a cardiac arrest in November of 2010. Cardiac catheterization at that time revealed an ejection fraction of 35%. Normal left main. The LAD had scattered 20-30% lesions. The first diagonal was occluded. The left circumflex had scattered 30-40% lesions and then occluded following the OM. The OM is stented, and the stent is widely patent without significant stenosis. The second OM and distal circumflex fill from left-to-left collaterals. The right coronary artery was known to be totally occluded in its proximal segment. The distal right coronary artery branches fill from left-to-right collaterals. The saphenous vein graft to first diagonal is widely patent. There is mild 40-50% stenosis of the diagonal just after the graft insertion site. Left internal mammary artery to LAD is patent. Saphenous vein graft to distal right coronary artery is known to be occluded. The saphenous vein graft to OM branch was totally occluded. This is a chronic occlusion as  well. Echocardiogram initially revealed an EF of 10-15% however followup echo showed an EF of 40-45%, mild biatrial enlargement and mild mitral regurgitation. Given out of the hospital VF arrest a [employer] single lead ICD was placed. Nuclear study 5/19 showed EF 30, inferior MI with mild peri-infarct ischemia. Patient has been seen by Dr. Fletcher Anon for peripheral vascular disease and claudication.  He has been treated medically but does have an ABI of 0.63 in the right lower extremity with claudication.  Since I last saw him,patient denies dyspnea, chest pain, palpitations or syncope.  He continues to have claudication in his right lower extremity.  He walks 30 minutes a day but stops periodically due to pain in his right leg.  The patient does not have symptoms concerning for COVID-19 infection (fever, chills, cough, or new shortness of breath).    Past Medical History:  Diagnosis Date  . Blood in stool   . Cardiac arrest - ventricular fibrillation 02/11/2009   a. s/p ICD  . CHF (congestive heart failure) ([place])   . Coronary artery disease    s/p CABG  . Hyperlipidemia   . Hypertension   . Ischemic cardiomyopathy   . Marijuana abuse   . Myocardial infarct ([name])    NON-ST-SEGMENT ELEVATION MI  . Peripheral vascular disease, unspecified ([place])    Past Surgical History:  Procedure Laterality Date  . CARDIAC DEFIBRILLATOR PLACEMENT     STJ single chamber ICD implanted for secondary prevention  . CIRCUMCISION    . CORONARY ARTERY BYPASS GRAFT  06/17/01   X 4     Current Meds  Medication Sig  .  acetaminophen (TYLENOL) 500 MG tablet Take 1,000 mg by mouth every 8 (eight) hours as needed (pain).  [name] aspirin 81 MG EC tablet Take 81 mg by mouth every morning.   [name] atorvastatin (LIPITOR) 80 MG tablet Take 1 tablet (80 mg total) by mouth daily.  . carvedilol (COREG) 25 MG tablet Take 1 tablet (25 mg total) by mouth 2 (two) times daily with a meal.  . dicyclomine (BENTYL) 20 MG tablet TAKE 1 TABLET  BY MOUTH TWICE A DAY  . ezetimibe (ZETIA) 10 MG tablet Take 1 tablet (10 mg total) by mouth daily.  . furosemide (LASIX) 20 MG tablet Take 1 tablet (20 mg total) by mouth daily.  . isosorbide mononitrate (IMDUR) 30 MG 24 hr tablet Take 1 tablet (30 mg total) by mouth daily.  [name] lisinopril-hydrochlorothiazide (PRINZIDE,ZESTORETIC) 20-25 MG tablet Take 1 tablet by mouth daily.  . montelukast (SINGULAIR) 10 MG tablet Take 1 tablet (10 mg total) by mouth at bedtime.  . Multiple Vitamin (MULTIVITAMIN) tablet Take 1 tablet by mouth every morning.   . nitroGLYCERIN (NITROSTAT) 0.4 MG SL tablet Place 1 tablet (0.4 mg total) under the tongue every 5 (five) minutes as needed for chest pain.  [name] omeprazole (PRILOSEC) 40 MG capsule Take 1 capsule (40 mg total) by mouth daily.  . ondansetron (ZOFRAN) 4 MG tablet Take 1 tablet (4 mg total) by mouth every 8 (eight) hours as needed for nausea or vomiting.  . potassium chloride SA (KLOR-CON M20) 20 MEQ tablet Take 1 tablet (20 mEq total) by mouth daily.     Allergies:   Patient has no known allergies.   Social History   Tobacco Use  . Smoking status: Former Smoker    Packs/day: 2.00    Years: 20.00    Pack years: 40.00    Last attempt to quit: 04/04/2003    Years since quitting: 15.3  . Smokeless tobacco: Never Used  Substance Use Topics  . Alcohol use: No  . Drug use: No     Family Hx: The patient's family history includes Heart attack in his father; Heart disease in his sister; Hypertension in his mother and another family member.  ROS:   Please see the history of present illness.    No fevers, chills or productive cough.  Claudication right lower extremity. All other systems reviewed and are negative.   Recent Labs: 10/18/2017: ALT 18 04/30/2018: BUN 18; Creatinine, Ser 1.24; Potassium 4.2; Sodium 139   Recent Lipid Panel Lab Results  Component Value Date/Time   CHOL 142 10/18/2017 08:36 AM   TRIG 58 10/18/2017 08:36 AM   HDL 46  10/18/2017 08:36 AM   CHOLHDL 3.1 10/18/2017 08:36 AM   CHOLHDL 3.1 12/07/2015 07:39 AM   LDLCALC 84 10/18/2017 08:36 AM    Wt Readings from Last 3 Encounters:  08/20/18 215 lb (97.5 kg)  04/30/18 210 lb 9.6 oz (95.5 kg)  03/05/18 211 lb (95.7 kg)     Objective:    Vital Signs:  Ht 5\' 11"  (1.803 m)   Wt 215 lb (97.5 kg)   BMI 29.99 kg/m    VITAL SIGNS:  reviewed  No acute distress Normal affect Answers questions appropriately Remainder of physical examination not performed (telehealth visit; coronavirus pandemic)  ASSESSMENT & PLAN:    1. Coronary artery disease-patient is not having chest pain.  Previous nuclear study not high risk.  Continue medical therapy with aspirin, statin and beta-blocker. 2. Hypertension-Continue present medications and follow. 3. Hyperlipidemia-continue statin.  Check lipids and liver. 4. Ischemic cardiomyopathy-continue lisinopril and beta-blocker.  Check potassium and renal function.  Patient did not tolerate Entresto previously. 5. Prior ICD-followed by electrophysiology. 6. Tobacco abuse-patient counseled on discontinuing.  Patient states he has not smoked in 2 months. 7. Peripheral vascular disease-patient continues with claudication and right lower extremity but would prefer conservative measures.  Continue aspirin and statin. FU Dr Fletcher Anon.  COVID-19 Education: The importance of social distancing was discussed today.  Time:   Today, I have spent 12 minutes with the patient with telehealth technology discussing the above problems.     Medication Adjustments/Labs and Tests Ordered: Current medicines are reviewed at length with the patient today.  Concerns regarding medicines are outlined above.   Tests Ordered: No orders of the defined types were placed in this encounter.   Medication Changes: No orders of the defined types were placed in this encounter.   Disposition:  Follow up in 6 month(s)  Signed, Kirk Ruths, MD   08/20/2018 8:27 AM    [place]

## 2018-08-20 ENCOUNTER — Encounter: Payer: Self-pay | Admitting: Cardiology

## 2018-08-20 ENCOUNTER — Telehealth (INDEPENDENT_AMBULATORY_CARE_PROVIDER_SITE_OTHER): Payer: Medicare Other | Admitting: Cardiology

## 2018-08-20 VITALS — Ht 71.0 in | Wt 215.0 lb

## 2018-08-20 DIAGNOSIS — I2510 Atherosclerotic heart disease of native coronary artery without angina pectoris: Secondary | ICD-10-CM

## 2018-08-20 DIAGNOSIS — I255 Ischemic cardiomyopathy: Secondary | ICD-10-CM

## 2018-08-20 DIAGNOSIS — E7800 Pure hypercholesterolemia, unspecified: Secondary | ICD-10-CM

## 2018-08-20 DIAGNOSIS — I10 Essential (primary) hypertension: Secondary | ICD-10-CM

## 2018-08-20 NOTE — Patient Instructions (Signed)
Medication Instructions:  NO CHANGE If you need a refill on your cardiac medications before your next appointment, please call your pharmacy.   Lab work: Your physician recommends that you return for lab work PRIOR TO EATING If you have labs (blood work) drawn today and your tests are completely normal, you will receive your results only by: . MyChart Message (if you have MyChart) OR . A paper copy in the mail If you have any lab test that is abnormal or we need to change your treatment, we will call you to review the results.  Follow-Up: At [facility], you and your health needs are our priority.  As part of our continuing mission to provide you with exceptional heart care, we have created designated Provider Care Teams.  These Care Teams include your primary Cardiologist (physician) and Advanced Practice Providers (APPs -  Physician Assistants and Nurse Practitioners) who all work together to provide you with the care you need, when you need it. You will need a follow up appointment in 6 months.  Please call our office 2 months in advance to schedule this appointment.  You may see BRIAN CRENSHAW MD or one of the following Advanced Practice Providers on your designated Care Team:   Luke Kilroy, PA-C Krista Kroeger, PA-C . Callie Goodrich, PA-C     

## 2018-08-20 NOTE — Telephone Encounter (Signed)
VERBAL CONSENT GIVEN TO Billy [name] ON 08/19/2018 AT 10:35AM.     Virtual Visit Pre-Appointment Phone Call  "Billy [name], I am calling you today to discuss your upcoming appointment. We are currently trying to limit exposure to the virus that causes COVID-19 by seeing patients at home rather than in the office."  1. "What is the BEST phone number to call the day of the visit?" - include this in appointment notes  2. "Do you have or have access to (through a family member/friend) a smartphone with video capability that we can use for your visit?" a. If yes - list this number in appt notes as "cell" (if different from BEST phone #) and list the appointment type as a VIDEO visit in appointment notes b. If no - list the appointment type as a PHONE visit in appointment notes  3. Confirm consent - "In the setting of the current Covid19 crisis, you are scheduled for a VIDEO visit with your provider on 08/20/2018 at 8:40AM.  Just as we do with many in-office visits, in order for you to participate in this visit, we must obtain consent.  If you'd like, I can send this to your mychart (if signed up) or email for you to review.  Otherwise, I can obtain your verbal consent now.  All virtual visits are billed to your insurance company just like a normal visit would be.  By agreeing to a virtual visit, we'd like you to understand that the technology does not allow for your provider to perform an examination, and thus may limit your provider's ability to fully assess your condition. If your provider identifies any concerns that need to be evaluated in person, we will make arrangements to do so.  Finally, though the technology is pretty good, we cannot assure that it will always work on either your or our end, and in the setting of a video visit, we may have to convert it to a phone-only visit.  In either situation, we cannot ensure that we have a secure connection.  Are you willing to proceed?" STAFF: Did the  patient verbally acknowledge consent to telehealth visit? Document YES/NO here: YES  4. Advise patient to be prepared - "Two hours prior to your appointment, go ahead and check your blood pressure, pulse, oxygen saturation, and your weight (if you have the equipment to check those) and write them all down. When your visit starts, your provider will ask you for this information. If you have an Apple Watch or Kardia device, please plan to have heart rate information ready on the day of your appointment. Please have a pen and paper handy nearby the day of the visit as well."  5. Give patient instructions for MyChart download to smartphone OR Doximity/Doxy.me as below if video visit (depending on what platform provider is using)  6. Inform patient they will receive a phone call 15 minutes prior to their appointment time (may be from unknown caller ID) so they should be prepared to answer    TELEPHONE CALL NOTE  Billy [name] has been deemed a candidate for a follow-up tele-health visit to limit community exposure during the Covid-19 pandemic. I spoke with the patient via phone to ensure availability of phone/video source, confirm preferred email & phone number, and discuss instructions and expectations.  I reminded Billy [name] to be prepared with any vital sign and/or heart rhythm information that could potentially be obtained via home monitoring, at the time of his visit. I reminded Billy  [name] to expect a phone call prior to his visit.  Harold Hedge, CMA 08/20/2018 8:15 AM   INSTRUCTIONS FOR DOWNLOADING THE MYCHART APP TO SMARTPHONE  - The patient must first make sure to have activated MyChart and know their login information - If Apple, go to [employer] and type in MyChart in the search bar and download the app. If Android, ask patient to go to [employer] and type in [place] in the search bar and download the app. The app is free but as with any other app downloads, their phone  may require them to verify saved payment information or Apple/Android password.  - The patient will need to then log into the app with their MyChart username and password, and select  as their healthcare provider to link the account. When it is time for your visit, go to the MyChart app, find appointments, and click Begin Video Visit. Be sure to Select Allow for your device to access the Microphone and Camera for your visit. You will then be connected, and your provider will be with you shortly.  **If they have any issues connecting, or need assistance please contact MyChart service desk [phone])**  **If using a computer, in order to ensure the best quality for their visit they will need to use either of the following Internet Browsers: [employer], or Google Chrome**  IF USING DOXIMITY or DOXY.ME - The patient will receive a link just prior to their visit by text.     FULL LENGTH CONSENT FOR TELE-HEALTH VISIT   I hereby voluntarily request, consent and authorize [facility] and its employed or contracted physicians, physician assistants, nurse practitioners or other licensed health care professionals (the Practitioner), to provide me with telemedicine health care services (the "Services") as deemed necessary by the treating Practitioner. I acknowledge and consent to receive the Services by the Practitioner via telemedicine. I understand that the telemedicine visit will involve communicating with the Practitioner through live audiovisual communication technology and the disclosure of certain medical information by electronic transmission. I acknowledge that I have been given the opportunity to request an in-person assessment or other available alternative prior to the telemedicine visit and am voluntarily participating in the telemedicine visit.  I understand that I have the right to withhold or withdraw my consent to the use of telemedicine in the course of my  care at any time, without affecting my right to future care or treatment, and that the Practitioner or I may terminate the telemedicine visit at any time. I understand that I have the right to inspect all information obtained and/or recorded in the course of the telemedicine visit and may receive copies of available information for a reasonable fee.  I understand that some of the potential risks of receiving the Services via telemedicine include:  [name] Delay or interruption in medical evaluation due to technological equipment failure or disruption; . Information transmitted may not be sufficient (e.g. poor resolution of images) to allow for appropriate medical decision making by the Practitioner; and/or  . In rare instances, security protocols could fail, causing a breach of personal health information.  Furthermore, I acknowledge that it is my responsibility to provide information about my medical history, conditions and care that is complete and accurate to the best of my ability. I acknowledge that Practitioner's advice, recommendations, and/or decision may be based on factors not within their control, such as incomplete or inaccurate data provided by me or distortions of diagnostic images  or specimens that may result from electronic transmissions. I understand that the practice of medicine is not an exact science and that Practitioner makes no warranties or guarantees regarding treatment outcomes. I acknowledge that I will receive a copy of this consent concurrently upon execution via email to the email address I last provided but may also request a printed copy by calling the office of [facility].    I understand that my insurance will be billed for this visit.   I have read or had this consent read to me. . I understand the contents of this consent, which adequately explains the benefits and risks of the Services being provided via telemedicine.  . I have been provided ample opportunity to ask  questions regarding this consent and the Services and have had my questions answered to my satisfaction. . I give my informed consent for the services to be provided through the use of telemedicine in my medical care  By participating in this telemedicine visit I agree to the above.

## 2018-08-28 ENCOUNTER — Ambulatory Visit (INDEPENDENT_AMBULATORY_CARE_PROVIDER_SITE_OTHER): Payer: Medicare Other | Admitting: *Deleted

## 2018-08-28 DIAGNOSIS — I255 Ischemic cardiomyopathy: Secondary | ICD-10-CM

## 2018-08-28 LAB — CUP PACEART REMOTE DEVICE CHECK
Date Time Interrogation Session: 20200527202704
Implantable Lead Implant Date: 20101111
Implantable Lead Location: 753860
Implantable Lead Model: 7121
Implantable Pulse Generator Implant Date: 20101111
Pulse Gen Serial Number: 743367

## 2018-09-06 ENCOUNTER — Encounter: Payer: Self-pay | Admitting: Cardiology

## 2018-09-06 ENCOUNTER — Telehealth: Payer: Self-pay | Admitting: Cardiovascular Disease

## 2018-09-06 NOTE — Telephone Encounter (Signed)
call [phone] my chart/ consent/ pre reg completed

## 2018-09-06 NOTE — Progress Notes (Signed)
Remote ICD transmission.   

## 2018-09-10 ENCOUNTER — Telehealth: Payer: Self-pay

## 2018-09-10 ENCOUNTER — Telehealth: Payer: Medicare Other | Admitting: Cardiovascular Disease

## 2018-09-10 NOTE — Telephone Encounter (Signed)
Left a detailed voice message for the patient on his home number and the mobile number listed informing him that I was calling to go over his medications and virtals signs before his video visit with the doctor and that I will be calling back

## 2018-09-27 ENCOUNTER — Telehealth: Payer: Self-pay | Admitting: Cardiology

## 2018-09-27 NOTE — Telephone Encounter (Signed)
°*  STAT* If patient is at the pharmacy, call can be transferred to refill team.   1. Which medications need to be refilled? (please list name of each medication and dose if known)  isosorbide mononitrate (IMDUR) 30 MG 24 hr tablet  2. Which pharmacy/location (including street and city if local pharmacy) is medication to be sent to? [id number] [place] [address].  3. Do they need a 30 day or 90 day supply? 49   Wife tried to refill medication , but pharmacy said they needed a new rx from the doctor. Patient is out of medication

## 2018-09-30 ENCOUNTER — Other Ambulatory Visit: Payer: Self-pay | Admitting: *Deleted

## 2018-09-30 DIAGNOSIS — I10 Essential (primary) hypertension: Secondary | ICD-10-CM

## 2018-09-30 MED ORDER — ISOSORBIDE MONONITRATE ER 30 MG PO TB24: 30.0000 mg | ORAL_TABLET | Freq: Every day | ORAL | 0 refills | Status: DC

## 2018-09-30 NOTE — Telephone Encounter (Signed)
Imdur refilled

## 2018-11-27 ENCOUNTER — Ambulatory Visit (INDEPENDENT_AMBULATORY_CARE_PROVIDER_SITE_OTHER): Payer: Medicare Other | Admitting: *Deleted

## 2018-11-27 DIAGNOSIS — I255 Ischemic cardiomyopathy: Secondary | ICD-10-CM

## 2018-11-27 LAB — CUP PACEART REMOTE DEVICE CHECK
Battery Remaining Longevity: 34 mo
Battery Remaining Percentage: 28 %
Battery Voltage: 2.84 V
Brady Statistic RV Percent Paced: 1 %
Date Time Interrogation Session: 20200826072426
HighPow Impedance: 59 Ohm
Implantable Lead Implant Date: 20101111
Implantable Lead Location: 753860
Implantable Lead Model: 7121
Implantable Pulse Generator Implant Date: 20101111
Lead Channel Impedance Value: 510 Ohm
Lead Channel Pacing Threshold Amplitude: 1 V
Lead Channel Pacing Threshold Pulse Width: 0.5 ms
Lead Channel Sensing Intrinsic Amplitude: 12 mV
Lead Channel Setting Pacing Amplitude: 2.5 V
Lead Channel Setting Pacing Pulse Width: 0.5 ms
Lead Channel Setting Sensing Sensitivity: 0.5 mV
Pulse Gen Serial Number: 743367

## 2018-11-29 ENCOUNTER — Other Ambulatory Visit: Payer: Self-pay | Admitting: Cardiology

## 2018-12-01 ENCOUNTER — Other Ambulatory Visit: Payer: Self-pay | Admitting: Cardiology

## 2018-12-01 DIAGNOSIS — E7800 Pure hypercholesterolemia, unspecified: Secondary | ICD-10-CM

## 2018-12-06 ENCOUNTER — Encounter: Payer: Self-pay | Admitting: Cardiology

## 2018-12-06 NOTE — Progress Notes (Signed)
Remote ICD transmission.   

## 2018-12-17 ENCOUNTER — Ambulatory Visit: Payer: Medicare Other | Admitting: Cardiovascular Disease

## 2018-12-17 NOTE — Progress Notes (Deleted)
Cardiology Office Note   Date:  12/17/2018   ID:  [name], DOB 1965-06-30, MRN [id number]  PCP:  Charlott Rakes, MD  Cardiologist:  Dr. Stanford Breed  No chief complaint on file.     History of Present Illness: Billy [name] is a 53 y.o. male who is here today for follow-up visit regarding peripheral arterial disease.  The patient had previous cardiac arrest in November 2010.  He had previous CABG.  He had an ICD placement done.  Most recent ischemic cardiac evaluation in May showed an EF of 30% with evidence of inferior wall MI with mild peri-infarct ischemia.  Other medical problems include hypertension, tobacco use , PAD and hyperlipidemia.  He had previous left SFA stent many years ago.   The patient was seen recently for severe right calf claudication.  noninvasive vascular evaluation showed an ABI of 0.63 on the right and 0.98 on the left.  Duplex showed patent left SFA stent.  On the right side, there was moderate disease affecting the SFA.  Monophasic waveforms were noted starting in the common femoral artery.  He is not diabetic.   The patient was instructed to quit smoking and start a walking exercise program but he has not been consistent in doing that.  He reports that he rarely smokes now but there are people who smoke around him.  He reports that the right calf claudication is stable since last visit.  No rest pain.   Past Medical History:  Diagnosis Date  . Blood in stool   . Cardiac arrest - ventricular fibrillation 02/11/2009   a. s/p ICD  . CHF (congestive heart failure) ([facility])   . Coronary artery disease    s/p CABG  . Hyperlipidemia   . Hypertension   . Ischemic cardiomyopathy   . Marijuana abuse   . Myocardial infarct ([name])    NON-ST-SEGMENT ELEVATION MI  . Peripheral vascular disease, unspecified ([place])     Past Surgical History:  Procedure Laterality Date  . CARDIAC DEFIBRILLATOR PLACEMENT     STJ single chamber ICD implanted for secondary  prevention  . CIRCUMCISION    . CORONARY ARTERY BYPASS GRAFT  06/17/01   X 4     Current Outpatient Medications  Medication Sig Dispense Refill  . acetaminophen (TYLENOL) 500 MG tablet Take 1,000 mg by mouth every 8 (eight) hours as needed (pain).    [name] aspirin 81 MG EC tablet Take 81 mg by mouth every morning.     [name] atorvastatin (LIPITOR) 80 MG tablet Take 1 tablet (80 mg total) by mouth daily. 90 tablet 1  . carvedilol (COREG) 25 MG tablet Take 1 tablet (25 mg total) by mouth 2 (two) times daily with a meal. 180 tablet 1  . dicyclomine (BENTYL) 20 MG tablet TAKE 1 TABLET BY MOUTH TWICE A DAY 180 tablet 1  . ezetimibe (ZETIA) 10 MG tablet TAKE 1 TABLET BY MOUTH EVERY DAY 90 tablet 0  . furosemide (LASIX) 20 MG tablet TAKE 1 TABLET BY MOUTH EVERY DAY 90 tablet 0  . isosorbide mononitrate (IMDUR) 30 MG 24 hr tablet Take 1 tablet (30 mg total) by mouth daily. 90 tablet 0  . lisinopril-hydrochlorothiazide (PRINZIDE,ZESTORETIC) 20-25 MG tablet Take 1 tablet by mouth daily. 90 tablet 1  . montelukast (SINGULAIR) 10 MG tablet Take 1 tablet (10 mg total) by mouth at bedtime. 90 tablet 1  . Multiple Vitamin (MULTIVITAMIN) tablet Take 1 tablet by mouth every morning.     [name]  nitroGLYCERIN (NITROSTAT) 0.4 MG SL tablet Place 1 tablet (0.4 mg total) under the tongue every 5 (five) minutes as needed for chest pain. 25 tablet 3  . omeprazole (PRILOSEC) 40 MG capsule Take 1 capsule (40 mg total) by mouth daily. 90 capsule 3  . ondansetron (ZOFRAN) 4 MG tablet Take 1 tablet (4 mg total) by mouth every 8 (eight) hours as needed for nausea or vomiting. 30 tablet 1  . potassium chloride SA (KLOR-CON M20) 20 MEQ tablet Take 1 tablet (20 mEq total) by mouth daily. 90 tablet 1   No current facility-administered medications for this visit.     Allergies:   Patient has no known allergies.    Social History:  The patient  reports that he quit smoking about 15 years ago. He has a 40.00 pack-year smoking history. He  has never used smokeless tobacco. He reports that he does not drink alcohol or use drugs.   Family History:  The patient's family history includes Heart attack in his father; Heart disease in his sister; Hypertension in his mother and another family member.    ROS:  Please see the history of present illness.   Otherwise, review of systems are positive for none.   All other systems are reviewed and negative.    PHYSICAL EXAM: VS:  There were no vitals taken for this visit. , BMI There is no height or weight on file to calculate BMI. GEN: Well nourished, well developed, in no acute distress  HEENT: normal  Neck: no JVD, carotid bruits, or masses Cardiac: RRR; no murmurs, rubs, or gallops,no edema  Respiratory:  clear to auscultation bilaterally, normal work of breathing GI: soft, nontender, nondistended, + BS MS: no deformity or atrophy  Skin: warm and dry, no rash Neuro:  Strength and sensation are intact Psych: euthymic mood, full affect Vascular: Femoral pulses +1 on the right and +2 on the left.    EKG:  EKG is ordered today. EKG showed sinus bradycardia with possible left atrial enlargement   Recent Labs: 04/30/2018: BUN 18; Creatinine, Ser 1.24; Potassium 4.2; Sodium 139    Lipid Panel    Component Value Date/Time   CHOL 142 10/18/2017 0836   TRIG 58 10/18/2017 0836   HDL 46 10/18/2017 0836   CHOLHDL 3.1 10/18/2017 0836   CHOLHDL 3.1 12/07/2015 0739   VLDL 19 12/07/2015 0739   LDLCALC 84 10/18/2017 0836      Wt Readings from Last 3 Encounters:  08/20/18 215 lb (97.5 kg)  04/30/18 210 lb 9.6 oz (95.5 kg)  03/05/18 211 lb (95.7 kg)       No flowsheet data found.    ASSESSMENT AND PLAN:  1.  Peripheral arterial disease with severe right leg claudication Rutherford class III: ABI was moderately reduced at 0.63.  Possible inflow disease on the right side.  His symptoms are severe but the patient does not feel that they are lifestyle limiting.  He prefers to  continue medical therapy for now over angiography and endovascular intervention.  2.  Coronary artery disease involving native coronary arteries without angina: Continue medical therapy.  3.  Chronic systolic heart failure: He appears to be euvolemic and currently on optimal medical therapy.  4.  Hyperlipidemia: Continue treatment with atorvastatin and Zetia with a target LDL of less than 70.  5.  Tobacco use: I again discussed with him the importance of smoking cessation.    Disposition:   FU with me in 6 months  Signed,  [name], MD  12/17/2018 8:03 AM    [place]

## 2019-01-03 ENCOUNTER — Other Ambulatory Visit: Payer: Self-pay | Admitting: Cardiology

## 2019-01-03 DIAGNOSIS — I10 Essential (primary) hypertension: Secondary | ICD-10-CM

## 2019-01-17 DIAGNOSIS — I10 Essential (primary) hypertension: Secondary | ICD-10-CM | POA: Diagnosis not present

## 2019-01-17 DIAGNOSIS — E7800 Pure hypercholesterolemia, unspecified: Secondary | ICD-10-CM | POA: Diagnosis not present

## 2019-01-17 LAB — COMPREHENSIVE METABOLIC PANEL
ALT: 31 IU/L (ref 0–44)
AST: 29 IU/L (ref 0–40)
Albumin/Globulin Ratio: 1.7 (ref 1.2–2.2)
Albumin: 4.3 g/dL (ref 3.8–4.9)
Alkaline Phosphatase: 93 IU/L (ref 39–117)
BUN/Creatinine Ratio: 15 (ref 9–20)
BUN: 18 mg/dL (ref 6–24)
Bilirubin Total: 0.5 mg/dL (ref 0.0–1.2)
CO2: 23 mmol/L (ref 20–29)
Calcium: 9.3 mg/dL (ref 8.7–10.2)
Chloride: 104 mmol/L (ref 96–106)
Creatinine, Ser: 1.23 mg/dL (ref 0.76–1.27)
GFR calc Af Amer: 78 mL/min/{1.73_m2} (ref >59)
GFR calc non Af Amer: 67 mL/min/{1.73_m2} (ref >59)
Globulin, Total: 2.5 g/dL (ref 1.5–4.5)
Glucose: 92 mg/dL (ref 65–99)
Potassium: 4.5 mmol/L (ref 3.5–5.2)
Sodium: 138 mmol/L (ref 134–144)
Total Protein: 6.8 g/dL (ref 6.0–8.5)

## 2019-01-17 LAB — LIPID PANEL
Chol/HDL Ratio: 2.8 ratio (ref 0.0–5.0)
Cholesterol, Total: 126 mg/dL (ref 100–199)
HDL: 45 mg/dL (ref >39)
LDL Chol Calc (NIH): 68 mg/dL (ref 0–99)
Triglycerides: 60 mg/dL (ref 0–149)
VLDL Cholesterol Cal: 13 mg/dL (ref 5–40)

## 2019-01-20 ENCOUNTER — Other Ambulatory Visit: Payer: Self-pay | Admitting: Cardiology

## 2019-01-20 DIAGNOSIS — I10 Essential (primary) hypertension: Secondary | ICD-10-CM

## 2019-01-20 NOTE — Telephone Encounter (Signed)
°*  STAT* If patient is at the pharmacy, call can be transferred to refill team.   1. Which medications need to be refilled? (please list name of each medication and dose if known)  Carvedilol- pt need this today if possible please  2. Which pharmacy/location (including street and city if local pharmacy) is medication to be sent to? [facility] [address] [place]  3. Do they need a 30 day or 90 day supply? 90 days and refills

## 2019-01-21 ENCOUNTER — Other Ambulatory Visit: Payer: Self-pay | Admitting: *Deleted

## 2019-01-21 DIAGNOSIS — I10 Essential (primary) hypertension: Secondary | ICD-10-CM

## 2019-01-21 MED ORDER — CARVEDILOL 25 MG PO TABS: 25.0000 mg | ORAL_TABLET | Freq: Two times a day (BID) | ORAL | 3 refills | Status: DC

## 2019-01-21 NOTE — Telephone Encounter (Signed)
Refilled Carvedilol as requested  

## 2019-01-22 ENCOUNTER — Other Ambulatory Visit: Payer: Self-pay

## 2019-01-22 ENCOUNTER — Inpatient Hospital Stay (HOSPITAL_COMMUNITY)
Admission: EM | Admit: 2019-01-22 | Discharge: 2019-01-23 | DRG: 251 | Disposition: A | Discharge status: Home / Self Care | Payer: Medicare Other | Attending: Cardiology | Admitting: Cardiology

## 2019-01-22 ENCOUNTER — Emergency Department (HOSPITAL_COMMUNITY): Payer: Medicare Other

## 2019-01-22 ENCOUNTER — Encounter (HOSPITAL_COMMUNITY)
Admission: EM | Disposition: A | Discharge status: Home / Self Care | Payer: Self-pay | Source: Home / Self Care | Attending: Cardiology

## 2019-01-22 ENCOUNTER — Encounter (HOSPITAL_COMMUNITY): Payer: Self-pay | Admitting: Emergency Medicine

## 2019-01-22 DIAGNOSIS — Z79899 Other long term (current) drug therapy: Secondary | ICD-10-CM

## 2019-01-22 DIAGNOSIS — Z8249 Family history of ischemic heart disease and other diseases of the circulatory system: Secondary | ICD-10-CM | POA: Diagnosis not present

## 2019-01-22 DIAGNOSIS — I5040 Unspecified combined systolic (congestive) and diastolic (congestive) heart failure: Secondary | ICD-10-CM | POA: Diagnosis not present

## 2019-01-22 DIAGNOSIS — I25110 Atherosclerotic heart disease of native coronary artery with unstable angina pectoris: Secondary | ICD-10-CM | POA: Diagnosis not present

## 2019-01-22 DIAGNOSIS — Z6831 Body mass index (BMI) 31.0-31.9, adult: Secondary | ICD-10-CM | POA: Diagnosis not present

## 2019-01-22 DIAGNOSIS — I25710 Atherosclerosis of autologous vein coronary artery bypass graft(s) with unstable angina pectoris: Secondary | ICD-10-CM

## 2019-01-22 DIAGNOSIS — D72829 Elevated white blood cell count, unspecified: Secondary | ICD-10-CM | POA: Diagnosis present

## 2019-01-22 DIAGNOSIS — E785 Hyperlipidemia, unspecified: Secondary | ICD-10-CM | POA: Diagnosis present

## 2019-01-22 DIAGNOSIS — Z20828 Contact with and (suspected) exposure to other viral communicable diseases: Secondary | ICD-10-CM | POA: Diagnosis present

## 2019-01-22 DIAGNOSIS — I255 Ischemic cardiomyopathy: Secondary | ICD-10-CM | POA: Diagnosis present

## 2019-01-22 DIAGNOSIS — Z87891 Personal history of nicotine dependence: Secondary | ICD-10-CM | POA: Diagnosis not present

## 2019-01-22 DIAGNOSIS — I25810 Atherosclerosis of coronary artery bypass graft(s) without angina pectoris: Secondary | ICD-10-CM | POA: Diagnosis present

## 2019-01-22 DIAGNOSIS — I110 Hypertensive heart disease with heart failure: Secondary | ICD-10-CM | POA: Diagnosis present

## 2019-01-22 DIAGNOSIS — Z955 Presence of coronary angioplasty implant and graft: Secondary | ICD-10-CM | POA: Diagnosis not present

## 2019-01-22 DIAGNOSIS — I200 Unstable angina: Secondary | ICD-10-CM | POA: Diagnosis present

## 2019-01-22 DIAGNOSIS — Z7982 Long term (current) use of aspirin: Secondary | ICD-10-CM

## 2019-01-22 DIAGNOSIS — E669 Obesity, unspecified: Secondary | ICD-10-CM | POA: Diagnosis present

## 2019-01-22 DIAGNOSIS — Z95810 Presence of automatic (implantable) cardiac defibrillator: Secondary | ICD-10-CM | POA: Diagnosis not present

## 2019-01-22 DIAGNOSIS — I25119 Atherosclerotic heart disease of native coronary artery with unspecified angina pectoris: Secondary | ICD-10-CM | POA: Diagnosis present

## 2019-01-22 DIAGNOSIS — I252 Old myocardial infarction: Secondary | ICD-10-CM | POA: Diagnosis not present

## 2019-01-22 DIAGNOSIS — Z9861 Coronary angioplasty status: Secondary | ICD-10-CM

## 2019-01-22 DIAGNOSIS — K219 Gastro-esophageal reflux disease without esophagitis: Secondary | ICD-10-CM | POA: Diagnosis present

## 2019-01-22 DIAGNOSIS — I700 Atherosclerosis of aorta: Secondary | ICD-10-CM | POA: Diagnosis not present

## 2019-01-22 DIAGNOSIS — I10 Essential (primary) hypertension: Secondary | ICD-10-CM | POA: Diagnosis not present

## 2019-01-22 DIAGNOSIS — I5022 Chronic systolic (congestive) heart failure: Secondary | ICD-10-CM | POA: Diagnosis present

## 2019-01-22 DIAGNOSIS — E7800 Pure hypercholesterolemia, unspecified: Secondary | ICD-10-CM

## 2019-01-22 HISTORY — DX: Enterocolitis due to Clostridium difficile, not specified as recurrent: A04.72

## 2019-01-22 HISTORY — DX: Obesity, unspecified: E66.9

## 2019-01-22 HISTORY — PX: CORONARY BALLOON ANGIOPLASTY: CATH118233

## 2019-01-22 HISTORY — PX: LEFT HEART CATH AND CORS/GRAFTS ANGIOGRAPHY: CATH118250

## 2019-01-22 HISTORY — DX: Peripheral vascular disease, unspecified: I73.9

## 2019-01-22 HISTORY — DX: Personal history of nicotine dependence: Z87.891

## 2019-01-22 HISTORY — DX: Chronic systolic (congestive) heart failure: I50.22

## 2019-01-22 HISTORY — DX: Ventricular tachycardia, unspecified: I47.20

## 2019-01-22 HISTORY — DX: Atherosclerosis of aorta: I70.0

## 2019-01-22 HISTORY — DX: Ventricular tachycardia: I47.2

## 2019-01-22 LAB — BASIC METABOLIC PANEL
Anion gap: 10 (ref 5–15)
BUN: 14 mg/dL (ref 6–20)
CO2: 24 mmol/L (ref 22–32)
Calcium: 9.6 mg/dL (ref 8.9–10.3)
Chloride: 101 mmol/L (ref 98–111)
Creatinine, Ser: 1.11 mg/dL (ref 0.61–1.24)
GFR calc Af Amer: >60 mL/min (ref >60)
GFR calc non Af Amer: >60 mL/min (ref >60)
Glucose, Bld: 109 mg/dL — ABNORMAL HIGH (ref 70–99)
Potassium: 3.8 mmol/L (ref 3.5–5.1)
Sodium: 135 mmol/L (ref 135–145)

## 2019-01-22 LAB — CBC
HCT: 47.9 % (ref 39.0–52.0)
Hemoglobin: 16.5 g/dL (ref 13.0–17.0)
MCH: 28.6 pg (ref 26.0–34.0)
MCHC: 34.4 g/dL (ref 30.0–36.0)
MCV: 83.2 fL (ref 80.0–100.0)
Platelets: 244 10*3/uL (ref 150–400)
RBC: 5.76 MIL/uL (ref 4.22–5.81)
RDW: 14.1 % (ref 11.5–15.5)
WBC: 11.8 10*3/uL — ABNORMAL HIGH (ref 4.0–10.5)
nRBC: 0 % (ref 0.0–0.2)

## 2019-01-22 LAB — TROPONIN I (HIGH SENSITIVITY)
Troponin I (High Sensitivity): 8 ng/L (ref <18)
Troponin I (High Sensitivity): 9 ng/L (ref <18)

## 2019-01-22 LAB — SARS CORONAVIRUS 2 BY RT PCR (HOSPITAL ORDER, PERFORMED IN ~~LOC~~ HOSPITAL LAB): SARS Coronavirus 2: NEGATIVE

## 2019-01-22 LAB — POCT ACTIVATED CLOTTING TIME: Activated Clotting Time: 307 seconds

## 2019-01-22 SURGERY — LEFT HEART CATH AND CORS/GRAFTS ANGIOGRAPHY
Anesthesia: LOCAL

## 2019-01-22 MED ORDER — LIDOCAINE HCL (PF) 1 % IJ SOLN
INTRAMUSCULAR | Status: DC | PRN
  Administered 2019-01-22: 2 mL via INTRADERMAL

## 2019-01-22 MED ORDER — SODIUM CHLORIDE 0.9% FLUSH: 3.0000 mL | Freq: Two times a day (BID) | INTRAVENOUS | Status: DC

## 2019-01-22 MED ORDER — LABETALOL HCL 5 MG/ML IV SOLN: 10.0000 mg | INTRAVENOUS | Status: AC | PRN

## 2019-01-22 MED ORDER — SODIUM CHLORIDE 0.9% FLUSH: 3.0000 mL | Freq: Once | INTRAVENOUS | Status: DC

## 2019-01-22 MED ORDER — VERAPAMIL HCL 2.5 MG/ML IV SOLN
INTRAVENOUS | Status: AC
  Filled 2019-01-22: qty 2

## 2019-01-22 MED ORDER — FENTANYL CITRATE (PF) 100 MCG/2ML IJ SOLN
INTRAMUSCULAR | Status: DC | PRN
  Administered 2019-01-22 (×2): 25 ug via INTRAVENOUS

## 2019-01-22 MED ORDER — NITROGLYCERIN 1 MG/10 ML FOR IR/CATH LAB
INTRA_ARTERIAL | Status: AC
  Filled 2019-01-22: qty 10

## 2019-01-22 MED ORDER — DICYCLOMINE HCL 20 MG PO TABS
20.0000 mg | ORAL_TABLET | Freq: Two times a day (BID) | ORAL | Status: DC
  Administered 2019-01-22 – 2019-01-23 (×2): 20 mg via ORAL
  Filled 2019-01-22 (×4): qty 1

## 2019-01-22 MED ORDER — EZETIMIBE 10 MG PO TABS
10.0000 mg | ORAL_TABLET | Freq: Every day | ORAL | Status: DC
  Administered 2019-01-23: 11:00:00 10 mg via ORAL
  Filled 2019-01-22: qty 1

## 2019-01-22 MED ORDER — SODIUM CHLORIDE 0.9 % IV SOLN: 250.0000 mL | INTRAVENOUS | Status: DC | PRN

## 2019-01-22 MED ORDER — HEPARIN SODIUM (PORCINE) 1000 UNIT/ML IJ SOLN
INTRAMUSCULAR | Status: DC | PRN
  Administered 2019-01-22: 6000 [IU] via INTRAVENOUS
  Administered 2019-01-22: 5000 [IU] via INTRAVENOUS

## 2019-01-22 MED ORDER — HYDRALAZINE HCL 20 MG/ML IJ SOLN: 10.0000 mg | INTRAMUSCULAR | Status: AC | PRN

## 2019-01-22 MED ORDER — ASPIRIN EC 81 MG PO TBEC
81.0000 mg | DELAYED_RELEASE_TABLET | Freq: Every morning | ORAL | Status: DC
  Administered 2019-01-23: 11:00:00 81 mg via ORAL
  Filled 2019-01-22: qty 1

## 2019-01-22 MED ORDER — HEPARIN SODIUM (PORCINE) 1000 UNIT/ML IJ SOLN
INTRAMUSCULAR | Status: AC
  Filled 2019-01-22: qty 1

## 2019-01-22 MED ORDER — HEPARIN BOLUS VIA INFUSION
4000.0000 [IU] | Freq: Once | INTRAVENOUS | Status: DC
  Filled 2019-01-22: qty 4000

## 2019-01-22 MED ORDER — SODIUM CHLORIDE 0.9% FLUSH: 3.0000 mL | INTRAVENOUS | Status: DC | PRN

## 2019-01-22 MED ORDER — SODIUM CHLORIDE 0.9 % WEIGHT BASED INFUSION: 1.0000 mL/kg/h | INTRAVENOUS | Status: DC

## 2019-01-22 MED ORDER — FAMOTIDINE IN NACL 20-0.9 MG/50ML-% IV SOLN
20.0000 mg | Freq: Once | INTRAVENOUS | Status: AC
  Administered 2019-01-22: 20 mg via INTRAVENOUS
  Filled 2019-01-22: qty 50

## 2019-01-22 MED ORDER — MIDAZOLAM HCL 2 MG/2ML IJ SOLN
INTRAMUSCULAR | Status: DC | PRN
  Administered 2019-01-22: 1 mg via INTRAVENOUS

## 2019-01-22 MED ORDER — ADULT MULTIVITAMIN W/MINERALS CH
1.0000 | ORAL_TABLET | Freq: Every morning | ORAL | Status: DC
  Administered 2019-01-23: 11:00:00 1 via ORAL
  Filled 2019-01-22: qty 1

## 2019-01-22 MED ORDER — EZETIMIBE 10 MG PO TABS: 10.0000 mg | ORAL_TABLET | Freq: Every day | ORAL | 0 refills | Status: DC

## 2019-01-22 MED ORDER — FENTANYL CITRATE (PF) 100 MCG/2ML IJ SOLN
INTRAMUSCULAR | Status: AC
  Filled 2019-01-22: qty 2

## 2019-01-22 MED ORDER — HEPARIN (PORCINE) IN NACL 1000-0.9 UT/500ML-% IV SOLN
INTRAVENOUS | Status: DC | PRN
  Administered 2019-01-22 (×2): 500 mL

## 2019-01-22 MED ORDER — MIDAZOLAM HCL 2 MG/2ML IJ SOLN
INTRAMUSCULAR | Status: AC
  Filled 2019-01-22: qty 2

## 2019-01-22 MED ORDER — NITROGLYCERIN 0.4 MG SL SUBL: 0.4000 mg | SUBLINGUAL_TABLET | SUBLINGUAL | Status: DC | PRN

## 2019-01-22 MED ORDER — MONTELUKAST SODIUM 10 MG PO TABS
10.0000 mg | ORAL_TABLET | Freq: Every day | ORAL | Status: DC
  Administered 2019-01-22: 22:00:00 10 mg via ORAL
  Filled 2019-01-22: qty 1

## 2019-01-22 MED ORDER — HEPARIN (PORCINE) 25000 UT/250ML-% IV SOLN
1200.0000 [IU]/h | INTRAVENOUS | Status: DC
  Filled 2019-01-22: qty 250

## 2019-01-22 MED ORDER — CHLORHEXIDINE GLUCONATE CLOTH 2 % EX PADS
6.0000 | MEDICATED_PAD | Freq: Every day | CUTANEOUS | Status: DC
  Administered 2019-01-22: 19:00:00 6 via TOPICAL

## 2019-01-22 MED ORDER — SODIUM CHLORIDE 0.9 % IV SOLN
INTRAVENOUS | Status: AC
  Administered 2019-01-22 (×2): via INTRAVENOUS

## 2019-01-22 MED ORDER — ATORVASTATIN CALCIUM 80 MG PO TABS
80.0000 mg | ORAL_TABLET | Freq: Every day | ORAL | Status: DC
  Administered 2019-01-23: 11:00:00 80 mg via ORAL
  Filled 2019-01-22: qty 1

## 2019-01-22 MED ORDER — NITROGLYCERIN 2 % TD OINT
1.0000 [in_us] | TOPICAL_OINTMENT | Freq: Four times a day (QID) | TRANSDERMAL | Status: DC
  Administered 2019-01-22: 1 [in_us] via TOPICAL
  Filled 2019-01-22: qty 30
  Filled 2019-01-22: qty 1

## 2019-01-22 MED ORDER — SODIUM CHLORIDE 0.9 % IV SOLN
INTRAVENOUS | Status: AC | PRN
  Administered 2019-01-22: 10 mL/h via INTRAVENOUS

## 2019-01-22 MED ORDER — ASPIRIN 81 MG PO CHEW
324.0000 mg | CHEWABLE_TABLET | Freq: Once | ORAL | Status: AC
  Administered 2019-01-22: 16:00:00 324 mg via ORAL
  Filled 2019-01-22: qty 4

## 2019-01-22 MED ORDER — ONDANSETRON HCL 4 MG/2ML IJ SOLN: 4.0000 mg | Freq: Four times a day (QID) | INTRAMUSCULAR | Status: DC | PRN

## 2019-01-22 MED ORDER — IOHEXOL 350 MG/ML SOLN
INTRAVENOUS | Status: DC | PRN
  Administered 2019-01-22: 205 mL

## 2019-01-22 MED ORDER — NITROGLYCERIN 1 MG/10 ML FOR IR/CATH LAB
INTRA_ARTERIAL | Status: DC | PRN
  Administered 2019-01-22 (×2): 200 ug via INTRACORONARY

## 2019-01-22 MED ORDER — VERAPAMIL HCL 2.5 MG/ML IV SOLN
INTRAVENOUS | Status: DC | PRN
  Administered 2019-01-22: 10 mL via INTRA_ARTERIAL

## 2019-01-22 MED ORDER — PANTOPRAZOLE SODIUM 40 MG PO TBEC
80.0000 mg | DELAYED_RELEASE_TABLET | Freq: Every day | ORAL | Status: DC
  Administered 2019-01-23: 80 mg via ORAL
  Filled 2019-01-22: qty 2

## 2019-01-22 MED ORDER — ONDANSETRON HCL 4 MG PO TABS: 4.0000 mg | ORAL_TABLET | Freq: Three times a day (TID) | ORAL | Status: DC | PRN

## 2019-01-22 MED ORDER — TICAGRELOR 90 MG PO TABS
ORAL_TABLET | ORAL | Status: DC | PRN
  Administered 2019-01-22: 180 mg via ORAL

## 2019-01-22 MED ORDER — HEPARIN (PORCINE) IN NACL 1000-0.9 UT/500ML-% IV SOLN
INTRAVENOUS | Status: AC
  Filled 2019-01-22: qty 1000

## 2019-01-22 MED ORDER — TICAGRELOR 90 MG PO TABS
90.0000 mg | ORAL_TABLET | Freq: Two times a day (BID) | ORAL | Status: DC
  Administered 2019-01-22 – 2019-01-23 (×2): 90 mg via ORAL
  Filled 2019-01-22 (×2): qty 1

## 2019-01-22 MED ORDER — LIDOCAINE HCL (PF) 1 % IJ SOLN
INTRAMUSCULAR | Status: AC
  Filled 2019-01-22: qty 30

## 2019-01-22 MED ORDER — ACETAMINOPHEN 325 MG PO TABS
650.0000 mg | ORAL_TABLET | ORAL | Status: DC | PRN
  Administered 2019-01-22: 650 mg via ORAL
  Filled 2019-01-22: qty 2

## 2019-01-22 MED ORDER — TICAGRELOR 90 MG PO TABS
ORAL_TABLET | ORAL | Status: AC
  Filled 2019-01-22: qty 2

## 2019-01-22 MED ORDER — SODIUM CHLORIDE 0.9 % WEIGHT BASED INFUSION
3.0000 mL/kg/h | INTRAVENOUS | Status: AC
  Administered 2019-01-22: 16:00:00 3 mL/kg/h via INTRAVENOUS

## 2019-01-22 SURGICAL SUPPLY — 18 items
BALLN WOLVERINE 3.00X6 (BALLOONS) ×2
BALLOON WOLVERINE 3.00X6 (BALLOONS) IMPLANT
CATH DXT MULTI JL4 JR4 ANG PIG (CATHETERS) ×1 IMPLANT
CATH VISTA GUIDE 6FR XB3 (CATHETERS) ×1 IMPLANT
CATH VISTA GUIDE 6FR XB3.5 (CATHETERS) ×1 IMPLANT
DEVICE RAD COMP TR BAND LRG (VASCULAR PRODUCTS) ×1 IMPLANT
ELECT DEFIB PAD ADLT CADENCE (PAD) ×1 IMPLANT
GLIDESHEATH SLEND SS 6F .021 (SHEATH) ×1 IMPLANT
GUIDEWIRE INQWIRE 1.5J.035X260 (WIRE) IMPLANT
INQWIRE 1.5J .035X260CM (WIRE) ×2
KIT ENCORE 26 ADVANTAGE (KITS) ×1 IMPLANT
KIT HEART LEFT (KITS) ×2 IMPLANT
PACK CARDIAC CATHETERIZATION (CUSTOM PROCEDURE TRAY) ×2 IMPLANT
SHEATH PROBE COVER 6X72 (BAG) ×1 IMPLANT
TRANSDUCER W/STOPCOCK (MISCELLANEOUS) ×2 IMPLANT
TUBING CIL FLEX 10 FLL-RA (TUBING) ×2 IMPLANT
WIRE ASAHI PROWATER 180CM (WIRE) ×1 IMPLANT
WIRE HI TORQ BMW 190CM (WIRE) ×1 IMPLANT

## 2019-01-22 NOTE — ED Triage Notes (Signed)
Pt reports left sided CP that radiates to left arm. Pain is coming and going. Reports heaviness. Pt has defibrillator.

## 2019-01-22 NOTE — Interval H&P Note (Signed)
History and Physical Interval Note:  01/22/2019 5:08 PM  Billy [name]  has presented today for surgery, with the diagnosis of unstable angina.  The various methods of treatment have been discussed with the patient and family. After consideration of risks, benefits and other options for treatment, the patient has consented to  Procedure(s): LEFT HEART CATH AND CORS/GRAFTS ANGIOGRAPHY (N/A)  PERCUTANEOUS CORONARY INTERVENTION   as a surgical intervention.  The patient's history has been reviewed, patient examined, no change in status, stable for surgery.  I have reviewed the patient's chart and labs.  Questions were answered to the patient's satisfaction.     Cath Lab Visit (complete for each Cath Lab visit)  Clinical Evaluation Leading to the Procedure:   ACS: Yes.    Non-ACS:    Anginal Classification: CCS III  Anti-ischemic medical therapy: Maximal Therapy (2 or more classes of medications)  Non-Invasive Test Results: No non-invasive testing performed  Prior CABG: Previous CABG   Glenetta Hew

## 2019-01-22 NOTE — ED Notes (Signed)
Upon moving the patient to the hallway he states "See the feeling is coming back, its right here and points to chest. EDP notified immediately. New order for EKG placed.

## 2019-01-22 NOTE — ED Notes (Signed)
Pt moved to the hallway per charge RN.

## 2019-01-22 NOTE — H&P (Addendum)
Cardiology Admission History and Physical:   Patient ID: Billy [name] MRN: [id number]; DOB: 21-Oct-1965   Admission date: 01/22/2019  Primary Care Provider: Charlott Rakes, MD Primary Cardiologist: Kirk Ruths, MD  Primary Electrophysiologist:  None   Chief Complaint: chest pain  Patient Profile:   Billy [name] is a 53 y.o. male with a hx of CAD (PCI to Cx age 51, CABGx4 in 2003, BMS to [place] in 12/2007), prior VT, VF arrest 02/2009 s/p ICD, ischemic cardiomyopathy, chronic systolic CHF, HTN, HLD, marijuana abuse, former tobacco abuse, presumed PAD based on noninvasive testing, prior C diff infection who is being seen today for the evaluation of chest pain at the request of Dr. Johnney Killian.  History of Present Illness:   To recap, Mr. Paolucci had premature onset of CAD with stenting of the Cx around age 69 at 55. Repeat cath 06/2001 showed multivessel CAD and he underwent CABGx4 (LIMA-LAD, SVG-PDA, SVG-OM, SVG-ramus). In 12/2007 he developed VT during a treadmill stress test so underwent cardiac catheterization showing severe 3-vessel CAD with 2/4 grafts patent with mildly reduced EF. He underwent BMS to the OM1. Outpatient EP evaluation was planned. Cardiac MRI in 03/2008 showed moderate left ventricular cavity enlargement, posterior lateral akinesis, two thirds thickness scar involving the posterior lateral wall had mid and basal level, the total amount of scar burden in the ventricle is low, EF 35%, significant dyssynergia between the septum and lateral wall, left atrial enlargement. In 02/2009 he suffered an out-of-hospital VF arrest. Emergent cath showed no culprit, 2/4 patent bypass grafts, patent OM stent, known occlusions of VG-RCA and VG-OM). EF was down to 10-15%. He underwent St. Jude ICD implantation. Follow-up echo that month showed EF of 40-45% with akinesis of the basal-mid inferoposterior myocardium and mild mitral regurgitation. He has been followed with periodic nuclear stress  tests, last in 08/2017 showed LVEF 30%, findings c/w prior MI with peri-infarct ischemia, large defect of severe severity present in the basal inferior, basal inferolateral, mid inferior and mid inferolateral location, no new reversible ischemia seen compared to prior studies. Medical therapy was recommended, observing for recurrent angina. He has also been seen by Dr. Fletcher Anon for PVD with claudication and ABI of 0.63 in the RLE, managed medically given that he was not having lifestyle limiting symptoms. His device is followed by Dr. Caryl Comes with last device check 11/2018 unremarkable. He did not tolerate Entresto as he just felt generally poorly with this so has been on lisinopril in combination with HCTZ along with meds outlined below.  In general he's done well recently without angina. However, on Monday 10/19 he began to notice intermittent left sided chest aching with radiation to his left arm to his elbow. It has occurred inconsistently both with activity and at rest. He has also been able to exert himself without symptoms. He gives the example where he walked to the bus stop on Monday with his grandson and felt fine. However, when he returned home and rested in a chair he felt brief discomfort. On one occasion he noticed it after a meal. Today he noticed it happening when he walked to the trash cans and since that time has become more frequent. It usually lasts 30 seconds to 3-4 minutes and resolves spontaneously. The episodes have been coming on more and more frequently since it initially started, now as often as every 30 minutes. When it happens he gets a little nauseated. No diaphoresis, SOB, palpitations, ICD shocks, edema, orthopnea, fever, chills, cough or any other symptoms  recently. This is unlike prior angina in that it is extremely mild compared to what he previously had. He had another episode while sitting in the ER that resolved spontaneously.  When I initially saw him, he was in between episodes  and feeling "great." However, upon re-evaluation he was grimacing and holding his chest, reporting 5/10 pain. Labs show negative hsTroponin x 1, mild leukocytosis of 11.8, glucose 109, L 3.8, Cr 1.11. He recently had labs 01/17/19 with normal LFTs and LDL 60. CXR today showed no acute CP disease; aortic atherosclerosis. VS show mild sinus bradycardia (chronic for patient) and normal pulse ox. He is not on telemetry since he is in a hallway bed - the ER is currently significantly full. BP is on the soft side, prohibiting empiric antianginal titration.   Heart Pathway Score:     Past Medical History:  Diagnosis Date   Aortic atherosclerosis ([place])    on CXR   Blood in stool    C. difficile colitis    a. remote hx 2010.   Cardiac arrest - ventricular fibrillation 02/11/2009   a. 02/2009 s/p St Jude ICD.   Chronic systolic CHF (congestive heart failure) ([name])    Coronary artery disease    a. PCI to Cx age 66. b. CABGx4 in 2003. c. BMS to [place] in 12/2007.   Former tobacco use    Hyperlipidemia    Hypertension    Ischemic cardiomyopathy    Marijuana abuse    Myocardial infarct ([place])    NON-ST-SEGMENT ELEVATION MI   Obesity    PAD (peripheral artery disease) ([place])    a. RLE by noninvasive testing - managed medically for now.   Ventricular tachyarrhythmia ([name])    Ventricular tachycardia ([name])     Past Surgical History:  Procedure Laterality Date   CARDIAC DEFIBRILLATOR PLACEMENT     STJ single chamber ICD implanted for secondary prevention   CIRCUMCISION     CORONARY ARTERY BYPASS GRAFT  06/17/01   X 4     Medications Prior to Admission: Prior to Admission medications   Medication Sig Start Date End Date Taking? Authorizing Provider  aspirin 81 MG EC tablet Take 81 mg by mouth every morning.    Yes [provider]  atorvastatin (LIPITOR) 80 MG tablet Take 1 tablet (80 mg total) by mouth daily. 05/31/18  Yes Lelon Perla, MD  carvedilol (COREG) 25 MG  tablet Take 1 tablet (25 mg total) by mouth 2 (two) times daily. 01/21/19  Yes Lelon Perla, MD  dicyclomine (BENTYL) 20 MG tablet TAKE 1 TABLET BY MOUTH TWICE A DAY Patient taking differently: Take 20 mg by mouth 2 (two) times daily.  04/01/18  Yes Charlott Rakes, MD  ezetimibe (ZETIA) 10 MG tablet Take 1 tablet (10 mg total) by mouth daily. 01/22/19  Yes Lelon Perla, MD  furosemide (LASIX) 20 MG tablet TAKE 1 TABLET BY MOUTH EVERY DAY Patient taking differently: Take 20 mg by mouth daily.  11/29/18  Yes Lelon Perla, MD  isosorbide mononitrate (IMDUR) 30 MG 24 hr tablet Take 1 tablet (30 mg total) by mouth daily. 09/30/18 03/29/19 Yes Lelon Perla, MD  lisinopril-hydrochlorothiazide (PRINZIDE,ZESTORETIC) 20-25 MG tablet Take 1 tablet by mouth daily. 05/31/18  Yes Lelon Perla, MD  montelukast (SINGULAIR) 10 MG tablet Take 1 tablet (10 mg total) by mouth at bedtime. 08/23/17  Yes Charlott Rakes, MD  Multiple Vitamin (MULTIVITAMIN) tablet Take 1 tablet by mouth every morning.  Yes [provider]  nitroGLYCERIN (NITROSTAT) 0.4 MG SL tablet Place 1 tablet (0.4 mg total) under the tongue every 5 (five) minutes as needed for chest pain. 05/31/18 01/22/19 Yes Lelon Perla, MD  omeprazole (PRILOSEC) 40 MG capsule Take 1 capsule (40 mg total) by mouth daily. 06/03/18  Yes Lelon Perla, MD  ondansetron (ZOFRAN) 4 MG tablet Take 1 tablet (4 mg total) by mouth every 8 (eight) hours as needed for nausea or vomiting. 08/23/17  Yes Newlin, Enobong, MD  potassium chloride SA (KLOR-CON M20) 20 MEQ tablet Take 1 tablet (20 mEq total) by mouth daily. 05/31/18  Yes Lelon Perla, MD     Allergies:   No Known Allergies  Social History:   Social History   Socioeconomic History   Marital status: Married    Spouse name: Not on file   Number of children: 3   Years of education: 11   Highest education level: Not on file  Occupational History   Occupation:  Disability  Social Designer, fashion/clothing strain: Not on file   Food insecurity    Worry: Not on file    Inability: Not on file   Transportation needs    Medical: Not on file    Non-medical: Not on file  Tobacco Use   Smoking status: Former Smoker    Packs/day: 2.00    Years: 20.00    Pack years: 40.00    Quit date: 04/04/2003    Years since quitting: 15.8   Smokeless tobacco: Never Used  Substance and Sexual Activity   Alcohol use: No   Drug use: No   Sexual activity: Yes    Partners: Female  Lifestyle   Physical activity    Days per week: Not on file    Minutes per session: Not on file   Stress: Not on file  Relationships   Social connections    Talks on phone: Not on file    Gets together: Not on file    Attends religious service: Not on file    Active member of club or organization: Not on file    Attends meetings of clubs or organizations: Not on file    Relationship status: Not on file   Intimate partner violence    Fear of current or ex partner: Not on file    Emotionally abused: Not on file    Physically abused: Not on file    Forced sexual activity: Not on file  Other Topics Concern   Not on file  Social History Narrative   MARRIED   FORMER TOBACCO USE, SMOKED FOR 20 YRS 2 PPD; QUIT IN 2005   NO ETOH   NO ILLICIT DRUG USE   NO REGULAR EXERCISE         ICD-[employer] ; CERTIFIED LETTER SENT AND RETURNED BAD ADDRESS, PLS UPDATE DJW.      Fun: Fishing     Family History:   The patient's family history includes Heart attack in his father; Heart disease in his sister; Hypertension in his mother and another family member.    ROS:  Please see the history of present illness.  All other ROS reviewed and negative.     Physical Exam/Data:   Vitals:   01/22/19 1218 01/22/19 1219 01/22/19 1416  BP: 124/67  92/73  Pulse: (!) 49  68  Resp: 15  17  Temp: 98.1 F (36.7 C)    TempSrc: Oral    SpO2: 99%  99%  Weight:  102.1 kg   Height:  5'  11" (1.803 m)    No intake or output data in the 24 hours ending 01/22/19 1519 Last 3 Weights 01/22/2019 08/20/2018 04/30/2018  Weight (lbs) 225 lb 215 lb 210 lb 9.6 oz  Weight (kg) 102.059 kg 97.523 kg 95.528 kg     Body mass index is 31.38 kg/m.  General:  Well nourished, well developed obese AAM, in no acute distress HEENT: normal Lymph: no adenopathy Neck: no JVD Endocrine:  No thryomegaly Vascular: No carotid bruits; FA pulses 2+ bilaterally without bruits  Cardiac:  normal S1, S2; RRR; no murmur Lungs: mildly diminished throughout but otherwise clear to auscultation bilaterally, no wheezing, rhonchi or rales  Abd: soft, nontender, no hepatomegaly  Ext: no edema Musculoskeletal:  No deformities, BUE and BLE strength normal and equal Skin: warm and dry  Neuro:  A+Ox3, follows commands, no focal abnormalities noted Psych:  Normal affect    EKG:  The ECG that was done today was personally reviewed: Sinus bradycardia 47bpm, possible prior inferior infarct, no acute STT changes, QTC 431ms  Relevant CV Studies: 1. Cannot copy cath report as it is scanned 2. 2D echo 02/2009 Study Conclusions  1. Left ventricle: The cavity size was mildly dilated. Wall   thickness was normal. Systolic function was mildly to moderately   reduced. The estimated ejection fraction was in the range of 40%   to 45%. There is akinesis of the basal-mid inferoposterior   myocardium.  2. Mitral valve: Mild regurgitation.  3. Left atrium: The atrium was mildly dilated.  4. Right atrium: The atrium was mildly dilated.  Impressions: - Limited study to assess LV function; full doppler study not   performed. Otherwise studies referenced above.   Laboratory Data:  High Sensitivity Troponin:   Recent Labs  Lab 01/22/19 1222  TROPONINIHS 8      Chemistry Recent Labs  Lab 01/17/19 1009 01/22/19 1222  NA 138 135  K 4.5 3.8  CL 104 101  CO2 23 24  GLUCOSE 92 109*  BUN 18 14    CREATININE 1.23 1.11  CALCIUM 9.3 9.6  GFRNONAA 67 >60  GFRAA 78 >60  ANIONGAP  --  10    Recent Labs  Lab 01/17/19 1009  PROT 6.8  ALBUMIN 4.3  AST 29  ALT 31  ALKPHOS 93  BILITOT 0.5   Hematology Recent Labs  Lab 01/22/19 1222  WBC 11.8*  RBC 5.76  HGB 16.5  HCT 47.9  MCV 83.2  MCH 28.6  MCHC 34.4  RDW 14.1  PLT 244   BNPNo results for input(s): BNP, PROBNP in the last 168 hours.  DDimer No results for input(s): DDIMER in the last 168 hours.   Radiology/Studies:  Dg Chest 2 View  Result Date: 01/22/2019 CLINICAL DATA:  No radiographic evidence of acute cardiopulmonary disease. EXAM: CHEST - 2 VIEW COMPARISON:  Chest x-ray 07/03/2017. FINDINGS: Lung volumes are normal. No consolidative airspace disease. No pleural effusions. No pneumothorax. No pulmonary nodule or mass noted. Pulmonary vasculature and the cardiomediastinal silhouette are within normal limits. Aortic atherosclerosis status post median sternotomy. Left-sided pacemaker/AICD with lead tip projecting over the expected location of the right ventricular apex. IMPRESSION: 1.  No radiographic evidence of acute cardiopulmonary disease. 2. Aortic atherosclerosis. Electronically Signed   By: Vinnie Langton M.D.   On: 01/22/2019 13:05    Assessment and Plan:   1. Chest pain with mixed atypical/typical features in context of  known significant CAD - initial hsTroponin was negative and EKG is nonacute. Episodes are very short lived but becoming increasingly more frequent and occurring without provocation. He does not typically get chest pain so this is new for him. Stress testing unlikely to be very helpful given his diffuse disease and the fact that blood pressure does not permit empiric titration of antiaginals. His anatomy has been complex. Per discussion with Dr. Meda Coffee, anticipate catheterization. Risks/benefits discussed with patient who is in agreement to proceed. Will rapid Covid test. EDP has also ordered  ASA, NTG, and pepcid. BP may prohibit NTG so will continue to cycle f/u check.  2. Mild leukocytosis - no other infective symptoms. Would trend.  3. Hyperlipidemia - recent lipids well controlled. Continue home Zetia and atorvastatin.  4. H/o remote VT and VF arrest 2010 s/p ICD - will request device interrogation. Per d/w cardmaster, rep is in a case and requested ER send off interrogation. I spoke with EDP to request this and rep will reach out to Dr. Meda Coffee with result.  Severity of Illness: The appropriate patient status for this patient is OBSERVATION. Observation status is judged to be reasonable and necessary in order to provide the required intensity of service to ensure the patient's safety. The patient's presenting symptoms, physical exam findings, and initial radiographic and laboratory data in the context of their medical condition is felt to place them at decreased risk for further clinical deterioration. Furthermore, it is anticipated that the patient will be medically stable for discharge from the hospital within 2 midnights of admission. The following factors support the patient status of observation.   " The patient's presenting symptoms include chest pain " The physical exam findings include obesity " The initial radiographic and laboratory data are showing sinus bradycardia, mild leukocytosis.  For questions or updates, please contact [place] Please consult [email or web address] for contact info under    Signed, Charlie Pitter, PA-C  01/22/2019 3:19 PM    The patient was seen, examined and discussed with Brittainy M. Rosita Fire, PA-C and I agree with the above.   53 y.o. male with a hx of CAD (PCI to Cx age 89, CABGx4 in 2003, BMS to [place] in 12/2007), prior VT, VF arrest 02/2009 s/p ICD, ischemic cardiomyopathy, chronic systolic CHF, HTN, HLD, former tobacco abuse, cardiac MRI in 03/2008 showed moderate left ventricular cavity enlargement, posterior lateral akinesis, two thirds  thickness scar involving the posterior lateral wall had mid and basal level, the total amount of scar burden in the ventricle is low, EF 35%, significant dyssynergia between the septum and lateral wall, left atrial enlargement. In 02/2009 he suffered an out-of-hospital VF arrest. Emergent cath showed no culprit, 2/4 patent bypass grafts, patent OM stent, known occlusions of VG-RCA and VG-OM). EF was down to 10-15%. He underwent St. Jude ICD implantation. Follow-up echo that month showed EF of 40-45% with akinesis of the basal-mid inferoposterior myocardium and mild mitral regurgitation.  He has had several stress tests since then, the last 1 in May 2019 with only scar no ischemia.  He is now coming with unstable angina, both exertional and nonexertional now coming every hour, radiating to his left arm, associated with diaphoresis, last episode in the ER.  His blood pressure is borderline at 92 systolic, we will start him on heparin drip for unstable angina and schedule him for cardiac cath.  The patient agrees.  He has no signs of fluid overload.  We will give him fluid bolus for hypotension,  his creatinine is 1.1 with GFR over 60.  We will hold his blood pressure meds given his hypotension.  His hemoglobin 16.5, platelets 244, his first troponin is 8.  His EKG shows sinus bradycardia with no significant ST-T wave abnormalities basically unchanged from prior.  Ena Dawley, MD 01/22/2019

## 2019-01-22 NOTE — ED Provider Notes (Addendum)
[facility] EMERGENCY DEPARTMENT Provider Note   CSN: [id number] Arrival date & time: 01/22/19  1214     History   Chief Complaint Chief Complaint  Patient presents with  . Chest Pain    HPI Billy [name] is a 53 y.o. male.     HPI Patient reports that he has started getting chest pain for about the past 3 days.  He reports that it is a pressure and aching quality in his left upper chest and radiates into the left arm.  He states that it comes on pretty quickly if he tries to walk or do activities.  If he is at rest he does not have pain.  He reports he starts to feel nauseated as well but is not experiencing shortness of breath.  He has not had any syncopal episode.  No coughing no fever no vomiting or diarrhea.  No pain in the calves or swelling of the legs.  Patient has a history significant for MI with cardiac arrest and defibrillator placed. Past Medical History:  Diagnosis Date  . Aortic atherosclerosis ([place])    on CXR  . Blood in stool   . C. difficile colitis    a. remote hx 2010.  . Cardiac arrest - ventricular fibrillation 02/11/2009   a. 02/2009 s/p St Jude ICD.  [name] Chronic systolic CHF (congestive heart failure) ([place])   . Coronary artery disease    a. PCI to Cx age 49. b. CABGx4 in 2003. c. BMS to [place] in 12/2007.  [name] Former tobacco use   . Hyperlipidemia   . Hypertension   . Ischemic cardiomyopathy   . Marijuana abuse   . Myocardial infarct ([name])    NON-ST-SEGMENT ELEVATION MI  . Obesity   . PAD (peripheral artery disease) ([place])    a. RLE by noninvasive testing - managed medically for now.  . Ventricular tachyarrhythmia ([place])   . Ventricular tachycardia [facility])     Patient Active Problem List   Diagnosis Date Noted  . Irritable bowel syndrome 08/23/2017  . Seasonal allergies 04/19/2017  . S/P quadruple vessel bypass 04/19/2017  . Prediabetes 04/19/2017  . Dysuria 06/06/2016  . GERD (gastroesophageal reflux disease) 01/31/2016  .  Implantable cardioverter-defibrillator (ICD) in situ 06/12/2011  . VENTRICULAR FIBRILLATION 03/02/2009  . Cardiomyopathy, ischemic 08/25/2008  . Hyperlipidemia 01/15/2008  . Essential hypertension 01/15/2008  . CORONARY ARTERY DISEASE 01/15/2008  . PERIPHERAL VASCULAR DISEASE 01/15/2008  . HEMATOCHEZIA 01/15/2008  . CHEST PAIN 01/15/2008    Past Surgical History:  Procedure Laterality Date  . CARDIAC DEFIBRILLATOR PLACEMENT     STJ single chamber ICD implanted for secondary prevention  . CIRCUMCISION    . CORONARY ARTERY BYPASS GRAFT  06/17/01   X 4        Home Medications    Prior to Admission medications   Medication Sig Start Date End Date Taking? Authorizing Provider  aspirin 81 MG EC tablet Take 81 mg by mouth every morning.    Yes [provider]  atorvastatin (LIPITOR) 80 MG tablet Take 1 tablet (80 mg total) by mouth daily. 05/31/18  Yes Lelon Perla, MD  carvedilol (COREG) 25 MG tablet Take 1 tablet (25 mg total) by mouth 2 (two) times daily. 01/21/19  Yes Lelon Perla, MD  dicyclomine (BENTYL) 20 MG tablet TAKE 1 TABLET BY MOUTH TWICE A DAY Patient taking differently: Take 20 mg by mouth 2 (two) times daily.  04/01/18  Yes Charlott Rakes, MD  ezetimibe (  ZETIA) 10 MG tablet Take 1 tablet (10 mg total) by mouth daily. 01/22/19  Yes Lelon Perla, MD  furosemide (LASIX) 20 MG tablet TAKE 1 TABLET BY MOUTH EVERY DAY Patient taking differently: Take 20 mg by mouth daily.  11/29/18  Yes Lelon Perla, MD  isosorbide mononitrate (IMDUR) 30 MG 24 hr tablet Take 1 tablet (30 mg total) by mouth daily. 09/30/18 03/29/19 Yes Lelon Perla, MD  lisinopril-hydrochlorothiazide (PRINZIDE,ZESTORETIC) 20-25 MG tablet Take 1 tablet by mouth daily. 05/31/18  Yes Lelon Perla, MD  montelukast (SINGULAIR) 10 MG tablet Take 1 tablet (10 mg total) by mouth at bedtime. 08/23/17  Yes Charlott Rakes, MD  Multiple Vitamin (MULTIVITAMIN) tablet Take 1 tablet by  mouth every morning.    Yes [provider]  nitroGLYCERIN (NITROSTAT) 0.4 MG SL tablet Place 1 tablet (0.4 mg total) under the tongue every 5 (five) minutes as needed for chest pain. 05/31/18 01/22/19 Yes Lelon Perla, MD  omeprazole (PRILOSEC) 40 MG capsule Take 1 capsule (40 mg total) by mouth daily. 06/03/18  Yes Lelon Perla, MD  ondansetron (ZOFRAN) 4 MG tablet Take 1 tablet (4 mg total) by mouth every 8 (eight) hours as needed for nausea or vomiting. 08/23/17  Yes Newlin, Enobong, MD  potassium chloride SA (KLOR-CON M20) 20 MEQ tablet Take 1 tablet (20 mEq total) by mouth daily. 05/31/18  Yes Lelon Perla, MD    Family History Family History  Problem Relation Age of Onset  . Hypertension Mother   . Heart attack Father        DIED AT 49 FROM MI  . Heart disease Sister        CAD AND PREVIOUS CABG  . Hypertension Other        IN MOST OF HIS SIBLINGS    Social History Social History   Tobacco Use  . Smoking status: Former Smoker    Packs/day: 2.00    Years: 20.00    Pack years: 40.00    Quit date: 04/04/2003    Years since quitting: 15.8  . Smokeless tobacco: Never Used  Substance Use Topics  . Alcohol use: No  . Drug use: No     Allergies   Patient has no known allergies.   Review of Systems Review of Systems 10 Systems reviewed and are negative for acute change except as noted in the HPI.   Physical Exam Updated Vital Signs BP 92/73 (BP Location: Right Arm)   Pulse 68   Temp 98.1 F (36.7 C) (Oral)   Resp 17   Ht 5\' 11"  (1.803 m)   Wt 102.1 kg   SpO2 99%   BMI 31.38 kg/m   Physical Exam Constitutional:      Appearance: He is well-developed.  HENT:     Head: Normocephalic and atraumatic.  Eyes:     Extraocular Movements: Extraocular movements intact.     Conjunctiva/sclera: Conjunctivae normal.  Neck:     Musculoskeletal: Neck supple.  Cardiovascular:     Rate and Rhythm: Normal rate and regular rhythm.     Heart sounds:  Normal heart sounds.  Pulmonary:     Effort: Pulmonary effort is normal.     Breath sounds: Normal breath sounds.  Chest:     Chest wall: No tenderness.  Abdominal:     General: Bowel sounds are normal. There is no distension.     Palpations: Abdomen is soft.     Tenderness: There is no abdominal  tenderness.  Musculoskeletal: Normal range of motion.        General: No swelling or tenderness.     Right lower leg: No edema.     Left lower leg: No edema.  Skin:    General: Skin is warm and dry.  Neurological:     Mental Status: He is alert and oriented to person, place, and time.     GCS: GCS eye subscore is 4. GCS verbal subscore is 5. GCS motor subscore is 6.     Coordination: Coordination normal.      ED Treatments / Results  Labs (all labs ordered are listed, but only abnormal results are displayed) Labs Reviewed  BASIC METABOLIC PANEL - Abnormal; Notable for the following components:      Result Value   Glucose, Bld 109 (*)    All other components within normal limits  CBC - Abnormal; Notable for the following components:   WBC 11.8 (*)    All other components within normal limits  SARS CORONAVIRUS 2 BY RT PCR (HOSPITAL ORDER, [facility])  TROPONIN I (HIGH SENSITIVITY)  TROPONIN I (HIGH SENSITIVITY)    EKG EKG Interpretation  Date/Time:  Wednesday January 22 2019 14:41:17 EDT Ventricular Rate:  48 PR Interval:  136 QRS Duration: 96 QT Interval:  448 QTC Calculation: 400 R Axis:   68 Text Interpretation:  Sinus bradycardia Otherwise normal ECG no interval change Confirmed by Charlesetta Shanks [id number]) on 01/22/2019 2:44:52 PM   Radiology Dg Chest 2 View  Result Date: 01/22/2019 CLINICAL DATA:  No radiographic evidence of acute cardiopulmonary disease. EXAM: CHEST - 2 VIEW COMPARISON:  Chest x-ray 07/03/2017. FINDINGS: Lung volumes are normal. No consolidative airspace disease. No pleural effusions. No pneumothorax. No pulmonary nodule  or mass noted. Pulmonary vasculature and the cardiomediastinal silhouette are within normal limits. Aortic atherosclerosis status post median sternotomy. Left-sided pacemaker/AICD with lead tip projecting over the expected location of the right ventricular apex. IMPRESSION: 1.  No radiographic evidence of acute cardiopulmonary disease. 2. Aortic atherosclerosis. Electronically Signed   By: Vinnie Langton M.D.   On: 01/22/2019 13:05    Procedures Procedures (including critical care time) CRITICAL CARE Performed by: Charlesetta Shanks   Total critical care time:30 minutes  Critical care time was exclusive of separately billable procedures and treating other patients.  Critical care was necessary to treat or prevent imminent or life-threatening deterioration.  Critical care was time spent personally by me on the following activities: development of treatment plan with patient and/or surrogate as well as nursing, discussions with consultants, evaluation of patient's response to treatment, examination of patient, obtaining history from patient or surrogate, ordering and performing treatments and interventions, ordering and review of laboratory studies, ordering and review of radiographic studies, pulse oximetry and re-evaluation of patient's condition. Medications Ordered in ED Medications  sodium chloride flush (NS) 0.9 % injection 3 mL (has no administration in time range)  nitroGLYCERIN (NITROGLYN) 2 % ointment 1 inch (has no administration in time range)  famotidine (PEPCID) IVPB 20 mg premix (has no administration in time range)  aspirin chewable tablet 324 mg (324 mg Oral Given 01/22/19 1605)     Initial Impression / Assessment and Plan / ED Course  I have reviewed the triage vital signs and the nursing notes.  Pertinent labs & imaging results that were available during my care of the patient were reviewed by me and considered in my medical decision making (see chart for details).  Clinical Course as of Jan 22 1608  Wed Jan 22, 2019  1340 Cardiology consulted by Card Master for [place]   [MP]  [id number] Patient was evaluated by Dr. Meda Coffee in the emergency department.  Plan will be for admission.   [MP]    Clinical Course User Index [MP] Charlesetta Shanks, MD      Patient has been having exertional chest pain with radiation to the left arm over the past 3 days.  He has significant cardiac history with risk factors.  He is otherwise alert and appropriate.  No respiratory distress.  No coughing no signs of infectious illness.  Cardiology consulted.  Final disposition per cardiology consultation.  Patient has taken all his regular medications this a.m.  He has taken his aspirin and carvedilol.  Blood pressures and heart rate are stable.  Final Clinical Impressions(s) / ED Diagnoses   Final diagnoses:  Unstable angina [facility])    ED Discharge Orders    None       Charlesetta Shanks, MD 01/22/19 1444    Charlesetta Shanks, MD 01/22/19 1609

## 2019-01-22 NOTE — Progress Notes (Signed)
ANTICOAGULATION CONSULT NOTE - Initial Consult  Pharmacy Consult for Heparin Indication: chest pain/ACS  No Known Allergies  Patient Measurements: Height: 5\' 11"  (180.3 cm) Weight: 225 lb (102.1 kg) IBW/kg (Calculated) : 75.3 Heparin Dosing Weight: 96.5 kg  Vital Signs: Temp: 98.1 F (36.7 C) (10/21 1218) Temp Source: Oral (10/21 1218) BP: 126/82 (10/21 1616) Pulse Rate: 57 (10/21 1616)  Labs: Recent Labs    01/22/19 1222  HGB 16.5  HCT 47.9  PLT 244  CREATININE 1.11  TROPONINIHS 8    Estimated Creatinine Clearance: 94.7 mL/min (by C-G formula based on SCr of 1.11 mg/dL).   Medical History: Past Medical History:  Diagnosis Date  . Aortic atherosclerosis ([place])    on CXR  . Blood in stool   . C. difficile colitis    a. remote hx 2010.  . Cardiac arrest - ventricular fibrillation 02/11/2009   a. 02/2009 s/p St Jude ICD.  [name] Chronic systolic CHF (congestive heart failure) ([place])   . Coronary artery disease    a. PCI to Cx age 49. b. CABGx4 in 2003. c. BMS to [place] in 12/2007.  [name] Former tobacco use   . Hyperlipidemia   . Hypertension   . Ischemic cardiomyopathy   . Marijuana abuse   . Myocardial infarct ([name])    NON-ST-SEGMENT ELEVATION MI  . Obesity   . PAD (peripheral artery disease) ([place])    a. RLE by noninvasive testing - managed medically for now.  . Ventricular tachyarrhythmia ([place])   . Ventricular tachycardia [facility])    Assessment: 53 y.o. male with a hx of CAD (PCI to Cx age 71, CABGx4 in 2003, BMS to [place] in 12/2007), prior VT, VF arrest 02/2009 s/p ICD, ischemic cardiomyopathy, chronic systolic CHF, HTN, HLD, marijuana abuse, former tobacco abuse, presumed PAD based on noninvasive testing, prior C diff infection who is being seen today for the evaluation of chest pain. Pharmacy has been consulted for heparin dosing. Patient was not on anticoagulation PTA.  Goal of Therapy:  Heparin level 0.3-0.7 units/ml Monitor platelets by anticoagulation protocol: Yes   Plan:  Give 4000 units bolus x 1 Start heparin infusion at 1200 units/hr Check anti-Xa level in 6 - 8 hours and daily while on heparin Continue to monitor H&H and platelets  Alanda Slim, PharmD, [facility] Clinical Pharmacist Please see AMION for all Pharmacists' Contact Phone Numbers 01/22/2019, 4:27 PM

## 2019-01-22 NOTE — ED Notes (Signed)
ED Provider at bedside. 

## 2019-01-22 NOTE — ED Notes (Signed)
Communication with PA from Cardiology. Pt has had 1 piece of sausage this morning. IV has been initiated with new vitals to be collected.  Pt will be COVID-swabbed-IN THE HALLWAY as there are not rooms available to to put the patient in. See previous note.   Plan to go to cath-LAB!!!!

## 2019-01-23 ENCOUNTER — Encounter (HOSPITAL_COMMUNITY): Payer: Self-pay | Admitting: Cardiology

## 2019-01-23 ENCOUNTER — Inpatient Hospital Stay (HOSPITAL_COMMUNITY): Payer: Medicare Other

## 2019-01-23 ENCOUNTER — Telehealth (HOSPITAL_COMMUNITY): Payer: Self-pay

## 2019-01-23 DIAGNOSIS — I10 Essential (primary) hypertension: Secondary | ICD-10-CM

## 2019-01-23 DIAGNOSIS — I5040 Unspecified combined systolic (congestive) and diastolic (congestive) heart failure: Secondary | ICD-10-CM

## 2019-01-23 DIAGNOSIS — I25110 Atherosclerotic heart disease of native coronary artery with unstable angina pectoris: Principal | ICD-10-CM

## 2019-01-23 LAB — HEPATIC FUNCTION PANEL
ALT: 36 U/L (ref 0–44)
AST: 28 U/L (ref 15–41)
Albumin: 3.4 g/dL — ABNORMAL LOW (ref 3.5–5.0)
Alkaline Phosphatase: 69 U/L (ref 38–126)
Bilirubin, Direct: 0.2 mg/dL (ref 0.0–0.2)
Indirect Bilirubin: 1.5 mg/dL — ABNORMAL HIGH (ref 0.3–0.9)
Total Bilirubin: 1.7 mg/dL — ABNORMAL HIGH (ref 0.3–1.2)
Total Protein: 6.6 g/dL (ref 6.5–8.1)

## 2019-01-23 LAB — CBC
HCT: 44.4 % (ref 39.0–52.0)
Hemoglobin: 15.7 g/dL (ref 13.0–17.0)
MCH: 29 pg (ref 26.0–34.0)
MCHC: 35.4 g/dL (ref 30.0–36.0)
MCV: 82.1 fL (ref 80.0–100.0)
Platelets: 234 10*3/uL (ref 150–400)
RBC: 5.41 MIL/uL (ref 4.22–5.81)
RDW: 14.1 % (ref 11.5–15.5)
WBC: 10 10*3/uL (ref 4.0–10.5)
nRBC: 0 % (ref 0.0–0.2)

## 2019-01-23 LAB — BASIC METABOLIC PANEL
Anion gap: 9 (ref 5–15)
BUN: 13 mg/dL (ref 6–20)
CO2: 23 mmol/L (ref 22–32)
Calcium: 9 mg/dL (ref 8.9–10.3)
Chloride: 103 mmol/L (ref 98–111)
Creatinine, Ser: 1.12 mg/dL (ref 0.61–1.24)
GFR calc Af Amer: >60 mL/min (ref >60)
GFR calc non Af Amer: >60 mL/min (ref >60)
Glucose, Bld: 119 mg/dL — ABNORMAL HIGH (ref 70–99)
Potassium: 3.7 mmol/L (ref 3.5–5.1)
Sodium: 135 mmol/L (ref 135–145)

## 2019-01-23 LAB — ECHOCARDIOGRAM COMPLETE
Height: 71 in
Weight: 3600 oz

## 2019-01-23 LAB — LIPID PANEL
Cholesterol: 112 mg/dL (ref 0–200)
HDL: 29 mg/dL — ABNORMAL LOW (ref >40)
LDL Cholesterol: 69 mg/dL (ref 0–99)
Total CHOL/HDL Ratio: 3.9 RATIO
Triglycerides: 68 mg/dL (ref <150)
VLDL: 14 mg/dL (ref 0–40)

## 2019-01-23 LAB — TSH: TSH: 2.076 u[IU]/mL (ref 0.350–4.500)

## 2019-01-23 LAB — HIV ANTIBODY (ROUTINE TESTING W REFLEX): HIV Screen 4th Generation wRfx: NONREACTIVE

## 2019-01-23 LAB — TROPONIN I (HIGH SENSITIVITY): Troponin I (High Sensitivity): 15 ng/L (ref <18)

## 2019-01-23 MED ORDER — POTASSIUM CHLORIDE CRYS ER 20 MEQ PO TBCR
40.0000 meq | EXTENDED_RELEASE_TABLET | Freq: Once | ORAL | Status: AC
  Administered 2019-01-23: 40 meq via ORAL
  Filled 2019-01-23: qty 2

## 2019-01-23 MED ORDER — TICAGRELOR 90 MG PO TABS: 90.0000 mg | ORAL_TABLET | Freq: Two times a day (BID) | ORAL | 0 refills | Status: DC

## 2019-01-23 MED FILL — BRILINTA 90 MG TABLET: 90 | 30 days supply | Qty: 60 | Fill #0

## 2019-01-23 NOTE — Progress Notes (Signed)
  Echocardiogram 2D Echocardiogram has been performed.  Billy [name] 01/23/2019, 9:29 AM

## 2019-01-23 NOTE — Plan of Care (Signed)

## 2019-01-23 NOTE — Progress Notes (Signed)
CARDIAC REHAB PHASE I   PRE:  Rate/Rhythm: 58 SB  BP:  Sitting: 130/82      SaO2: 100 RA  MODE:  Ambulation: 370 ft   POST:  Rate/Rhythm: 91 SR  BP:  Sitting: 136/100    SaO2: 99 RA   Pt ambulated 38ft in hallway independently with steady gait. P denies CP or SOB, states he feels "much better" today. Pt educated on importance of ASA and Brilinta. Pt given heart healthy diet and exercise guidelines. Reviewed restrictions and site care. Will refer to CRP II [place]. Pt is interested in participating in Virtual Cardiac and Pulmonary Rehab. Pt advised that Virtual Cardiac and Pulmonary Rehab is provided at no cost to the patient.  Checklist:  1. Pt has smart device  ie smartphone and/or ipad for downloading an app  Yes 2. Reliable internet/wifi service    Yes 3. Understands how to use their smartphone and navigate within an app.  Yes  Pt verbalized understanding and is in agreement.  [id number] Rufina Falco, RN BSN 01/23/2019 10:41 AM

## 2019-01-23 NOTE — Telephone Encounter (Signed)
Pt insurance is active and benefits verified through Medicare A/B. Co-pay $0.00, DED $198.00/$198.00 met, out of pocket $0.00/$0.00 met, co-insurance 20%. No pre-authorization required. Passport, 01/23/2019 @ 402PM, GIT#[id number]  Will contact patient to see if he is interested in the Cardiac Rehab Program. If interested, patient will need to complete follow up appt. Once completed, patient will be contacted for scheduling upon review by the RN Navigator.

## 2019-01-23 NOTE — Progress Notes (Addendum)
Progress Note  Patient Name: Billy [name] Date of Encounter: 01/23/2019  Primary Cardiologist: Kirk Ruths, MD   Subjective  O/N Events: No acute events  Mr.Burdine was examined and evaluated at bedside this am. He mentions that he has been symptom free since yesterday. Discussed the findings of his left heart cath and he was concerned he had a heart attack but discussed difference between angina vs MI. Mr.Lewman expressed understanding.  Inpatient Medications    Scheduled Meds: . aspirin EC  81 mg Oral q morning - 10a  . atorvastatin  80 mg Oral Daily  . Chlorhexidine Gluconate Cloth  6 each Topical Daily  . dicyclomine  20 mg Oral BID  . ezetimibe  10 mg Oral Daily  . heparin  4,000 Units Intravenous Once  . montelukast  10 mg Oral QHS  . multivitamin with minerals  1 tablet Oral q morning - 10a  . nitroGLYCERIN  1 inch Topical Q6H  . pantoprazole  80 mg Oral Daily  . sodium chloride flush  3 mL Intravenous Once  . sodium chloride flush  3 mL Intravenous Q12H  . sodium chloride flush  3 mL Intravenous Q12H  . ticagrelor  90 mg Oral BID   Continuous Infusions: . sodium chloride    . sodium chloride    . sodium chloride     PRN Meds: sodium chloride, sodium chloride, acetaminophen, nitroGLYCERIN, ondansetron (ZOFRAN) IV, ondansetron, sodium chloride flush, sodium chloride flush   Vital Signs    Vitals:   01/23/19 0200 01/23/19 0300 01/23/19 0400 01/23/19 0500  BP: 113/73 113/71 100/74 108/65  Pulse: (!) 49 (!) 50 (!) 45 61  Resp: 19 18 11 16   Temp:   (!) 97.5 F (36.4 C)   TempSrc:   Oral   SpO2: 99% 100% 99% 100%  Weight:      Height:        Intake/Output Summary (Last 24 hours) at 01/23/2019 0657 Last data filed at 01/23/2019 0400 Gross per 24 hour  Intake 1486.35 ml  Output 2500 ml  Net -1013.65 ml   Filed Weights   01/22/19 1219 01/22/19 1619  Weight: 102.1 kg 102.1 kg    Telemetry    Multiple PVC, sinus bradycardia w/ HR ~50-60 -  Personally Reviewed  ECG    Sinus brady, normal axis, stable T wave inversions in lead III, aVF consistent w/ prior EKG - Personally Reviewed  Physical Exam   GEN: No acute distress.   Neck: No JVD Cardiac: bradycardic, regular, no murmurs, rubs Respiratory: Clear to auscultation bilaterally. GI: Soft, nontender, non-distended  MS: No edema; Left radial artery cath site w/ fresh bandaging no significant bleed, peripheral p ulses intact Neuro:  Nonfocal  Psych: Normal affect   Labs    Chemistry Recent Labs  Lab 01/17/19 1009 01/22/19 1222 01/23/19 0515  NA 138 135 135  K 4.5 3.8 3.7  CL 104 101 103  CO2 23 24 23   GLUCOSE 92 109* 119*  BUN 18 14 13   CREATININE 1.23 1.11 1.12  CALCIUM 9.3 9.6 9.0  PROT 6.8  --  6.6  ALBUMIN 4.3  --  3.4*  AST 29  --  28  ALT 31  --  36  ALKPHOS 93  --  69  BILITOT 0.5  --  1.7*  GFRNONAA 67 >60 >60  GFRAA 78 >60 >60  ANIONGAP  --  10 9     Hematology Recent Labs  Lab 01/22/19 1222 01/23/19 0515  WBC 11.8* 10.0  RBC 5.76 5.41  HGB 16.5 15.7  HCT 47.9 44.4  MCV 83.2 82.1  MCH 28.6 29.0  MCHC 34.4 35.4  RDW 14.1 14.1  PLT 244 234    Cardiac EnzymesNo results for input(s): TROPONINI in the last 168 hours. No results for input(s): TROPIPOC in the last 168 hours.   BNPNo results for input(s): BNP, PROBNP in the last 168 hours.   DDimer No results for input(s): DDIMER in the last 168 hours.   Radiology    Dg Chest 2 View  Result Date: 01/22/2019 CLINICAL DATA:  No radiographic evidence of acute cardiopulmonary disease. EXAM: CHEST - 2 VIEW COMPARISON:  Chest x-ray 07/03/2017. FINDINGS: Lung volumes are normal. No consolidative airspace disease. No pleural effusions. No pneumothorax. No pulmonary nodule or mass noted. Pulmonary vasculature and the cardiomediastinal silhouette are within normal limits. Aortic atherosclerosis status post median sternotomy. Left-sided pacemaker/AICD with lead tip projecting over the expected  location of the right ventricular apex. IMPRESSION: 1.  No radiographic evidence of acute cardiopulmonary disease. 2. Aortic atherosclerosis. Electronically Signed   By: Vinnie Langton M.D.   On: 01/22/2019 13:05    Cardiac Studies    POTENTIAL CULPRIT LESION: Ost Cx lesion is 80% stenosed.  Scoring balloon angioplasty was performed using a BALLOON WOLVERINE 3.00X6.  Post intervention, there is a 40% residual stenosis.  -----------------------------------  1st Diag lesion is 100% stenosed. - grafted.  Ost Cx to Prox Cx lesion is 35% stenosed.  Previously placed Prox Cx stent (unknown type) is widely patent - as it goes into 1st Mrg stent, where it is ~5% stenosed.  Prox Cx to Mid Cx lesion is 100% stenosed- within the Stent @ 1st Marg takeoff. Distal Cx-OM fill late va L-L collaterals.  Prox RCA lesion is 70% stenosed. Prox RCA to Mid RCA lesion is 85% stenosed. Mid RCA to Dist RCA lesion is 100% stenosed.  -----------GRAFTS-previously known 100% SVG-RCA & SVG-OM2---------------------  LIMA-LAD graft was visualized by angiography and is small/atretic due to competive LAD flow. Mid Graft lesion is 100% stenosed.  SVG-1st Diag graft was visualized by angiography and is normal in caliber. Origin lesion is 40% stenosed.  SVG-RCA graft was injected, but not visualized due to known occlusion. Origin to Prox Graft lesion is 100% stenosed.  SVG-Cx(OM2) graft was not visualized due to known occlusion. Origin lesion is 100% stenosed.  -----------------HEMODYNAMICS-------------------------------  There is moderate left ventricular systolic dysfunction. The left ventricular ejection fraction is 35-45% by visual estimate. -Inferior akinesis-hypokinesis  LV end diastolic pressure is normal.   SUMMARY  Severe native vessel disease with known occlusion of mid RCA, AV groove LCx-OM2 (LPL 1) with also known occlusion of SVG-RCA and SVG-OM 2.  Newly documented atretic/underfilling LIMA-LAD  with competitive flow due to minimal disease in the LAD  Patent SVG-D1 with ostial 40-50% stenosis  Ostial Circumflex 80% stenosis reduced to 40% with scoring balloon PTCA  Mild-mildly reduced EF with dilated LV.  EF estimated roughly 40-45% (with dense inferior hypokinesis); relatively normal LVEDP  Patient Profile     53 y.o. male w/ PMH of CAD s/p CABG & DES , prior VT, VF arrest 02/2009 s/p ICD, ischemic cardiomyopathy, HFrEF, HTN, HLD, and presumed PAD admit for chest pain.  Assessment & Plan    Unstable Angina Multi-vessel CAD s/p CABG & DES Present with chest pain. Cath yesterday show 80% stenosis in Ost Cx s/p balloon angioplasty. Currently symptom free. Am EKG w/o ischemic changes. BP stable at  110/70 - F/u Echocardiogram - C/w DAPT: Wyn Quaker - will need to continue for 6 months - Nitroglycerin PRN for pain - Can d/c later today if no new concerning findings on Echo  Hx of Prior VT/VF arrest s/p ICD Hx of arrest in 2010. On carvedilol at home but consistently bradycardic. Normal device function on prior device interrogation. - Telemetry shows no tachyarrhythmia - C/w telemetry - Resume home beta blocker at discharge  HLD Lipid panel showing low HDl @29 , LDL <70. - C/w home meds: Zetia, Atorvastatin  For questions or updates, please contact [facility] Please consult [email or web address] for contact info under Cardiology/STEMI.     Signed, Gilberto Better, MD PGY-2, [facility] Pager: [phone] 01/23/2019, 6:57 AM    Attending attestation to follow  The patient was seen, examined and discussed with the resident Mosetta Anis, MD   and I agree with the above.   The patient underwent cath with findings of Severe native vessel disease with known occlusion of mid RCA, AV groove LCx-OM2 (LPL 1) with also known occlusion of SVG-RCA and SVG-OM 2. Newly documented atretic/underfilling LIMA-LAD with competitive flow due to minimal disease in the LAD. Patent SVG-D1 with  ostial 40-50% stenosis. Ostial Circumflex 80% stenosis reduced to 40% with scoring balloon PTCA. Mild-mildly reduced EF with dilated LV.  EF estimated roughly 40-45% (with dense inferior hypokinesis); relatively normal LVEDP The patient is asymptomatic this morning. Crea 1.1. We will continue DAPT with ASA, ticagrelor, atorvastatin, carvedilol. Vitals are stable, the patient will be discharged today and we will arrange for an outpatient follow up.  Ena Dawley, MD 01/23/2019

## 2019-01-23 NOTE — Discharge Instructions (Signed)
Radial Site Care ° °This sheet gives you information about how to care for yourself after your procedure. Your health care provider may also give you more specific instructions. If you have problems or questions, contact your health care provider. °What can I expect after the procedure? °After the procedure, it is common to have: °· Bruising and tenderness at the catheter insertion area. °Follow these instructions at home: °Medicines °· Take over-the-counter and prescription medicines only as told by your health care provider. °Insertion site care °· Follow instructions from your health care provider about how to take care of your insertion site. Make sure you: °? Wash your hands with soap and water before you change your bandage (dressing). If soap and water are not available, use hand sanitizer. °? Change your dressing as told by your health care provider. °? Leave stitches (sutures), skin glue, or adhesive strips in place. These skin closures may need to stay in place for 2 weeks or longer. If adhesive strip edges start to loosen and curl up, you may trim the loose edges. Do not remove adhesive strips completely unless your health care provider tells you to do that. °· Check your insertion site every day for signs of infection. Check for: °? Redness, swelling, or pain. °? Fluid or blood. °? Pus or a bad smell. °? Warmth. °· Do not take baths, swim, or use a hot tub until your health care provider approves. °· You may shower 24-48 hours after the procedure, or as directed by your health care provider. °? Remove the dressing and gently wash the site with plain soap and water. °? Pat the area dry with a clean towel. °? Do not rub the site. That could cause bleeding. °· Do not apply powder or lotion to the site. °Activity ° °· For 24 hours after the procedure, or as directed by your health care provider: °? Do not flex or bend the affected arm. °? Do not push or pull heavy objects with the affected arm. °? Do not  drive yourself home from the hospital or clinic. You may drive 24 hours after the procedure unless your health care provider tells you not to. °? Do not operate machinery or power tools. °· Do not lift anything that is heavier than 10 lb (4.5 kg), or the limit that you are told, until your health care provider says that it is safe. °· Ask your health care provider when it is okay to: °? Return to work or school. °? Resume usual physical activities or sports. °? Resume sexual activity. °General instructions °· If the catheter site starts to bleed, raise your arm and put firm pressure on the site. If the bleeding does not stop, get help right away. This is a medical emergency. °· If you went home on the same day as your procedure, a responsible adult should be with you for the first 24 hours after you arrive home. °· Keep all follow-up visits as told by your health care provider. This is important. °Contact a health care provider if: °· You have a fever. °· You have redness, swelling, or yellow drainage around your insertion site. °Get help right away if: °· You have unusual pain at the radial site. °· The catheter insertion area swells very fast. °· The insertion area is bleeding, and the bleeding does not stop when you hold steady pressure on the area. °· Your arm or hand becomes pale, cool, tingly, or numb. °These symptoms may represent a serious problem   that is an emergency. Do not wait to see if the symptoms will go away. Get medical help right away. Call your local emergency services (911 in the [place] Do not drive yourself to the hospital. °Summary °· After the procedure, it is common to have bruising and tenderness at the site. °· Follow instructions from your health care provider about how to take care of your radial site wound. Check the wound every day for signs of infection. °· Do not lift anything that is heavier than 10 lb (4.5 kg), or the limit that you are told, until your health care provider says  that it is safe. °This information is not intended to replace advice given to you by your health care provider. Make sure you discuss any questions you have with your health care provider. °Document Released: 04/22/2010 Document Revised: 04/25/2017 Document Reviewed: 04/25/2017 °Elsevier Patient Education © 2020 [employer] ° °

## 2019-01-23 NOTE — Progress Notes (Signed)
RN contacted Dr. Kalman Shan to clarify nitroglycerin ointment order. Patient is in sinus bradycardia with a heart rate from 50 -55 bpm. Last blood pressure 113/87. Patient has no complaints of chest pain or shortness of breath during shift. Dr. Kalman Shan said nitroglycerin ointment is not needed. RN did not apply nitroglycerin ointment. See MAR.

## 2019-01-23 NOTE — Progress Notes (Signed)
Read over discharge instructions with patient. Answered the patient's questions regarding d/c instructions & follow up care.  Patient verbalized understanding of d/c instructions. Patient has no further questions at this time. 2 PIVs removed from patient. Patient d/c to home via private vehicle with the patient's wife.    Alma Friendly 01/23/19 1500

## 2019-01-23 NOTE — Discharge Summary (Signed)
Discharge Summary    Patient ID: Billy [name],  MRN: [id number], DOB/AGE: [id number] 52 y.o.  Admit date: 01/22/2019 Discharge date: 01/23/2019  Primary Care Provider: Charlott Rakes Primary Cardiologist: Kirk Ruths, MD  Discharge Diagnoses    Active Problems:   Unstable angina [facility])   Coronary artery disease involving native coronary artery of native heart with unstable angina pectoris [facility])    Diagnostic Studies/Procedures   LHC 01/22/19 POTENTIAL CULPRIT LESION: Colon Flattery Cx lesion is 80% stenosed.  Scoring balloon angioplasty was performed using a BALLOON WOLVERINE 3.00X6.  Post intervention, there is a 40% residual stenosis.  -----------------------------------  1st Diag lesion is 100% stenosed. - grafted.  Ost Cx to Prox Cx lesion is 35% stenosed.  Previously placed Prox Cx stent (unknown type) is widely patent - as it goes into 1st Mrg stent, where it is ~5% stenosed.  Prox Cx to Mid Cx lesion is 100% stenosed- within the Stent @ 1st Marg takeoff. Distal Cx-OM fill late va L-L collaterals.  Prox RCA lesion is 70% stenosed. Prox RCA to Mid RCA lesion is 85% stenosed. Mid RCA to Dist RCA lesion is 100% stenosed.  -----------GRAFTS-previously known 100% SVG-RCA & SVG-OM2---------------------  LIMA-LAD graft was visualized by angiography and is small/atretic due to competive LAD flow. Mid Graft lesion is 100% stenosed.  SVG-1st Diag graft was visualized by angiography and is normal in caliber. Origin lesion is 40% stenosed.  SVG-RCA graft was injected, but not visualized due to known occlusion. Origin to Prox Graft lesion is 100% stenosed.  SVG-Cx(OM2) graft was not visualized due to known occlusion. Origin lesion is 100% stenosed.  -----------------HEMODYNAMICS-------------------------------  There is moderate left ventricular systolic dysfunction. The left ventricular ejection fraction is 35-45% by visual estimate. -Inferior akinesis-hypokinesis  LV end  diastolic pressure is normal.    SUMMARY  Severe native vessel disease with known occlusion of mid RCA, AV groove LCx-OM2 (LPL 1) with also known occlusion of SVG-RCA and SVG-OM 2.  Newly documented atretic/underfilling LIMA-LAD with competitive flow due to minimal disease in the LAD  Patent SVG-D1 with ostial 40-50% stenosis  Ostial Circumflex 80% stenosis reduced to 40% with scoring balloon PTCA  Mild-mildly reduced EF with dilated LV.  EF estimated roughly 40-45% (with dense inferior hypokinesis); relatively normal LVEDP  TTE 01/23/2019 IMPRESSIONS  1. Left ventricular ejection fraction, by visual estimation, is 50 to 55%. The left ventricle has mildly decreased function. Moderately increased left ventricular size. There is no left ventricular hypertrophy.  2. Mild inferior and apical hypokinesis  3. Left ventricular diastolic Doppler parameters are consistent with impaired relaxation pattern of LV diastolic filling.  4. Global right ventricle has mildly reduced systolic function.The right ventricular size is normal. No increase in right ventricular wall thickness.  5. Left atrial size was normal.  6. Right atrial size was normal.  7. The mitral valve is grossly normal. Trace mitral valve regurgitation.  8. The tricuspid valve is grossly normal. Tricuspid valve regurgitation was not visualized by color flow Doppler.  9. The aortic valve is tricuspid Aortic valve regurgitation was not visualized by color flow Doppler. 10. The pulmonic valve was grossly normal. Pulmonic valve regurgitation is not visualized by color flow Doppler. 11. The inferior vena cava is normal in size with greater than 50% respiratory variability, suggesting right atrial pressure of 3 mmHg. _____________   History of Present Illness     53 y.o. male w/ PMH of CAD s/p CABG & DES , prior VT, VF arrest  02/2009 s/p ICD, ischemic cardiomyopathy, HFrEF, HTN, HLD, and presumed PAD who presented w/ 2 day duration  intermittent left sided chest pain with radiation to left arm. He described it as chest ache w/ 30 second to 4 mins duration with progressively more frequent episodes. He felt this was much more mild than his prior episodes of chest pain but with increasing frequency, He came to [facility] for further evaluation.   Hospital Course    On admission, he was found to have negative troponins, no ischemic changes on EKG. He underwent left heart catherization due to extensive history of prior coronary artery disease requiring CABG and was found to have 80% stenosis in his circumflex artery which was treated with balloon angioplasty. Follow up echocardiogram was performed showing improvement in his EF from prior studies with EF 50-55%. No new areas of hypokinesis was noted. He was discharged on DAPT with Billy [name] and recommendation to f/u with cardiology. _____________  Discharge Vitals Blood pressure 114/84, pulse (!) 53, temperature 97.9 F (36.6 C), temperature source Oral, resp. rate 13, height 5\' 11"  (1.803 m), weight 102.1 kg, SpO2 100 %.  Filed Weights   01/22/19 1219 01/22/19 1619  Weight: 102.1 kg 102.1 kg    Labs & Radiologic Studies    CBC Recent Labs    01/22/19 1222 01/23/19 0515  WBC 11.8* 10.0  HGB 16.5 15.7  HCT 47.9 44.4  MCV 83.2 82.1  PLT 244 [id number]   Basic Metabolic Panel Recent Labs    01/22/19 1222 01/23/19 0515  NA 135 135  K 3.8 3.7  CL 101 103  CO2 24 23  GLUCOSE 109* 119*  BUN 14 13  CREATININE 1.11 1.12  CALCIUM 9.6 9.0   Liver Function Tests Recent Labs    01/23/19 0515  AST 28  ALT 36  ALKPHOS 69  BILITOT 1.7*  PROT 6.6  ALBUMIN 3.4*   No results for input(s): LIPASE, AMYLASE in the last 72 hours. Cardiac Enzymes No results for input(s): CKTOTAL, CKMB, CKMBINDEX, TROPONINI in the last 72 hours. BNP Invalid input(s): POCBNP D-Dimer No results for input(s): DDIMER in the last 72 hours. Hemoglobin A1C No results for input(s): HGBA1C in the  last 72 hours. Fasting Lipid Panel Recent Labs    01/23/19 0515  CHOL 112  HDL 29*  LDLCALC 69  TRIG 68  CHOLHDL 3.9   Thyroid Function Tests Recent Labs    01/23/19 0515  TSH 2.076   _____________  Dg Chest 2 View  Result Date: 01/22/2019 CLINICAL DATA:  No radiographic evidence of acute cardiopulmonary disease. EXAM: CHEST - 2 VIEW COMPARISON:  Chest x-ray 07/03/2017. FINDINGS: Lung volumes are normal. No consolidative airspace disease. No pleural effusions. No pneumothorax. No pulmonary nodule or mass noted. Pulmonary vasculature and the cardiomediastinal silhouette are within normal limits. Aortic atherosclerosis status post median sternotomy. Left-sided pacemaker/AICD with lead tip projecting over the expected location of the right ventricular apex. IMPRESSION: 1.  No radiographic evidence of acute cardiopulmonary disease. 2. Aortic atherosclerosis. Electronically Signed   By: Vinnie Langton M.D.   On: 01/22/2019 13:05   Disposition   Pt is being discharged home today in good condition.  Follow-up Plans & Appointments    Follow-up Information    Lelon Perla, MD. Call on 02/06/2019.   Specialty: Cardiology Why: 9:30 AM for Telehealth visit. Call [phone] if a provider does not call you at the designated time. Contact information: [address] [place] [address] [phone]  Discharge Instructions    Amb Referral to Cardiac Rehabilitation   Complete by: As directed    Diagnosis: PTCA   After initial evaluation and assessments completed: Virtual Based Care may be provided alone or in conjunction with Phase 2 Cardiac Rehab based on patient barriers.: Yes   Call MD for:  difficulty breathing, headache or visual disturbances   Complete by: As directed    Call MD for:  persistant dizziness or light-headedness   Complete by: As directed    Call MD for:  persistant nausea and vomiting   Complete by: As directed    Call MD for:   redness, tenderness, or signs of infection (pain, swelling, redness, odor or green/yellow discharge around incision site)   Complete by: As directed    Call MD for:  severe uncontrolled pain   Complete by: As directed    Call MD for:  temperature >100.4   Complete by: As directed    Diet - low sodium heart healthy   Complete by: As directed    Discharge instructions   Complete by: As directed    Dear Billy [name]  You came to [place] with chest pain. We have determined this was caused by angina. Here are our recommendations for you at discharge:  Please start Brillinta (ticagrelor) at 90mg  twice daily Please take all of your other home medications as prescribed Please make sure to follow up with your heart doctor:  You have a Telehealth visit scheduled for 02/06/19 at 9:20 am  Thank you for choosing [facility].   Increase activity slowly   Complete by: As directed       Discharge Medications   Allergies as of 01/23/2019   No Known Allergies     Medication List    TAKE these medications   aspirin 81 MG EC tablet Take 81 mg by mouth every morning.   atorvastatin 80 MG tablet Commonly known as: LIPITOR Take 1 tablet (80 mg total) by mouth daily.   carvedilol 25 MG tablet Commonly known as: COREG Take 1 tablet (25 mg total) by mouth 2 (two) times daily.   dicyclomine 20 MG tablet Commonly known as: BENTYL TAKE 1 TABLET BY MOUTH TWICE A DAY   ezetimibe 10 MG tablet Commonly known as: ZETIA Take 1 tablet (10 mg total) by mouth daily.   furosemide 20 MG tablet Commonly known as: LASIX TAKE 1 TABLET BY MOUTH EVERY DAY   isosorbide mononitrate 30 MG 24 hr tablet Commonly known as: IMDUR Take 1 tablet (30 mg total) by mouth daily.   lisinopril-hydrochlorothiazide 20-25 MG tablet Commonly known as: ZESTORETIC Take 1 tablet by mouth daily.   montelukast 10 MG tablet Commonly known as: SINGULAIR Take 1 tablet (10 mg total) by mouth at bedtime.   multivitamin tablet  Take 1 tablet by mouth every morning.   nitroGLYCERIN 0.4 MG SL tablet Commonly known as: NITROSTAT Place 1 tablet (0.4 mg total) under the tongue every 5 (five) minutes as needed for chest pain.   omeprazole 40 MG capsule Commonly known as: PRILOSEC Take 1 capsule (40 mg total) by mouth daily.   ondansetron 4 MG tablet Commonly known as: ZOFRAN Take 1 tablet (4 mg total) by mouth every 8 (eight) hours as needed for nausea or vomiting.   potassium chloride SA 20 MEQ tablet Commonly known as: Klor-Con M20 Take 1 tablet (20 mEq total) by mouth daily.   ticagrelor 90 MG Tabs tablet Commonly known as: BRILINTA Take 1 tablet (90  mg total) by mouth 2 (two) times daily.        No                               Did the patient have a percutaneous coronary intervention (stent / angioplasty)?:  Yes.    Cath/PCI Registry Performance & Quality Measures: 1. Aspirin prescribed? - Yes 2. ADP Receptor Inhibitor (Plavix/Clopidogrel, Brilinta/Ticagrelor or Effient/Prasugrel) prescribed (includes medically managed patients)? - Yes 3. High Intensity Statin (Lipitor 40-80mg  or Crestor 20-40mg ) prescribed? - Yes 4. For EF <40%, was ACEI/ARB prescribed? - Not Applicable (EF >/= [id number]) 5. For EF <40%, Aldosterone Antagonist (Spironolactone or Eplerenone) prescribed? - Not Applicable (EF >/= [id number]) 6. Cardiac Rehab Phase II ordered (Included Medically managed Patients)? - Yes    Outstanding Labs/Studies   N/A  Duration of Discharge Encounter   Greater than 30 minutes including physician time.  Signed, Mosetta Anis, MD 01/23/2019, 1:33 PM  PGY-2, [facility] Pager: [phone]

## 2019-01-27 ENCOUNTER — Encounter (HOSPITAL_COMMUNITY): Payer: Self-pay | Admitting: Cardiology

## 2019-01-27 NOTE — Telephone Encounter (Signed)
Called and spoke with pt wife Hassan Rowan in regards to CR, went over insurance, Hassan Rowan verbalized understanding. Patient will come in for orientation on 02/11/2019 @ 830AM and will attend the 845AM exercise class.  Mailed letter

## 2019-02-03 ENCOUNTER — Telehealth (HOSPITAL_COMMUNITY): Payer: Self-pay | Admitting: Pharmacist

## 2019-02-03 NOTE — Telephone Encounter (Signed)
Cardiac Rehab Medication Review by a Pharmacist  Does the patient  feel that his/her medications are working for him/her?  yes  Has the patient been experiencing any side effects to the medications prescribed?  no  Does the patient measure his/her own blood pressure or blood glucose at home?  no   Does the patient have any problems obtaining medications due to transportation or finances?   no  Understanding of regimen: excellent Understanding of indications: excellent Potential of compliance: excellent  Sherren Kerns, PharmD PGY1 Acute Care Pharmacy Resident 02/03/2019 3:59 PM

## 2019-02-04 ENCOUNTER — Other Ambulatory Visit: Payer: Self-pay

## 2019-02-04 ENCOUNTER — Ambulatory Visit (INDEPENDENT_AMBULATORY_CARE_PROVIDER_SITE_OTHER): Payer: Medicare Other | Admitting: Cardiovascular Disease

## 2019-02-04 ENCOUNTER — Telehealth: Payer: Self-pay | Admitting: Adult Health

## 2019-02-04 ENCOUNTER — Encounter: Payer: Self-pay | Admitting: Cardiovascular Disease

## 2019-02-04 VITALS — BP 115/79 | HR 73 | Temp 97.2°F | Ht 71.0 in | Wt 237.0 lb

## 2019-02-04 DIAGNOSIS — I5022 Chronic systolic (congestive) heart failure: Secondary | ICD-10-CM | POA: Diagnosis not present

## 2019-02-04 DIAGNOSIS — E785 Hyperlipidemia, unspecified: Secondary | ICD-10-CM

## 2019-02-04 DIAGNOSIS — Z720 Tobacco use: Secondary | ICD-10-CM | POA: Diagnosis not present

## 2019-02-04 DIAGNOSIS — I200 Unstable angina: Secondary | ICD-10-CM | POA: Diagnosis not present

## 2019-02-04 DIAGNOSIS — I739 Peripheral vascular disease, unspecified: Secondary | ICD-10-CM | POA: Diagnosis not present

## 2019-02-04 DIAGNOSIS — I25118 Atherosclerotic heart disease of native coronary artery with other forms of angina pectoris: Secondary | ICD-10-CM

## 2019-02-04 NOTE — Progress Notes (Signed)
Cardiology Office Note   Date:  02/07/2019   ID:  Billy [name], DOB [birth year], MRN [id number]  PCP:  Patient, No Pcp Per  Cardiologist:  Dr. Stanford Breed  No chief complaint on file.     History of Present Illness: Billy [name] is a 53 y.o. male who is here today for follow-up visit regarding peripheral arterial disease.  The patient had previous cardiac arrest in November 2010.  He had previous CABG.  He had an ICD placement done.Other medical problems include hypertension, tobacco use , PAD and hyperlipidemia.  He had previous left SFA stent many years ago.   The patient was seen by me last year for severe right calf claudication. Noninvasive vascular evaluation showed an ABI of 0.63 on the right and 0.98 on the left.  Duplex showed patent left SFA stent.  On the right side, there was moderate disease affecting the SFA.  Monophasic waveforms were noted starting in the common femoral artery.  The patient elected for medical therapy even though I felt that his claudication was severe. He ultimately quit smoking.  He had recent chest pain recently and underwent cardiac catheterization 2 weeks ago by Dr. Ellyn Hack.  Showed severe native 2-vessel coronary artery disease.  SVG to RCA and SVG to OM 2 were known to be occluded.  LIMA to LAD was found to be atretic with competitive flow due to minimal disease in the native LAD.  SVG to first diagonal was patent.  There was 80% ostial left circumflex stenosis which was treated with scoring balloon angioplasty.  The procedure was done via the left radial artery and since then the patient had significant discomfort in his left arm. He continues to complain of right calf claudication with some worsening since last year.  He reports resolution of chest pain.  Past Medical History:  Diagnosis Date  . Aortic atherosclerosis ([place])    on CXR  . Blood in stool   . C. difficile colitis    a. remote hx 2010.  . Cardiac arrest - ventricular fibrillation  02/11/2009   a. 02/2009 s/p St Jude ICD.  [name] Chronic systolic CHF (congestive heart failure) ([place])   . Coronary artery disease    a. PCI to Cx age 42. b. CABGx4 in 2003. c. BMS to [place] in 12/2007.  [name] Former tobacco use   . Hyperlipidemia   . Hypertension   . Ischemic cardiomyopathy   . Marijuana abuse   . Myocardial infarct ([name])    NON-ST-SEGMENT ELEVATION MI  . Obesity   . PAD (peripheral artery disease) ([place])    a. RLE by noninvasive testing - managed medically for now.  . Ventricular tachyarrhythmia ([place])   . Ventricular tachycardia [facility])     Past Surgical History:  Procedure Laterality Date  . CARDIAC DEFIBRILLATOR PLACEMENT     STJ single chamber ICD implanted for secondary prevention  . CIRCUMCISION    . CORONARY ARTERY BYPASS GRAFT  06/17/01   X 4  . CORONARY BALLOON ANGIOPLASTY N/A 01/22/2019   Procedure: CORONARY BALLOON ANGIOPLASTY;  Surgeon: Leonie Man, MD;  Location: [place] [facility];  Service: Cardiovascular;  Laterality: N/A;  . LEFT HEART CATH AND CORS/GRAFTS ANGIOGRAPHY N/A 01/22/2019   Procedure: LEFT HEART CATH AND CORS/GRAFTS ANGIOGRAPHY;  Surgeon: Leonie Man, MD;  Location: [place] [facility];  Service: Cardiovascular;  Laterality: N/A;     Current Outpatient Medications  Medication Sig Dispense Refill  . aspirin 81 MG EC tablet Take  81 mg by mouth every morning.     [name] atorvastatin (LIPITOR) 80 MG tablet Take 1 tablet (80 mg total) by mouth daily. 90 tablet 1  . carvedilol (COREG) 25 MG tablet Take 1 tablet (25 mg total) by mouth 2 (two) times daily. 180 tablet 3  . dicyclomine (BENTYL) 20 MG tablet TAKE 1 TABLET BY MOUTH TWICE A DAY (Patient taking differently: Take 20 mg by mouth 2 (two) times daily. ) 180 tablet 1  . ezetimibe (ZETIA) 10 MG tablet Take 1 tablet (10 mg total) by mouth daily. 90 tablet 0  . furosemide (LASIX) 20 MG tablet TAKE 1 TABLET BY MOUTH EVERY DAY (Patient taking differently: Take 20 mg by mouth daily. ) 90 tablet 0  .  isosorbide mononitrate (IMDUR) 30 MG 24 hr tablet Take 1 tablet (30 mg total) by mouth daily. 90 tablet 0  . lisinopril-hydrochlorothiazide (PRINZIDE,ZESTORETIC) 20-25 MG tablet Take 1 tablet by mouth daily. 90 tablet 1  . montelukast (SINGULAIR) 10 MG tablet Take 1 tablet (10 mg total) by mouth at bedtime. (Patient taking differently: Take 10 mg by mouth at bedtime as needed (for allergies). ) 90 tablet 1  . Multiple Vitamin (MULTIVITAMIN) tablet Take 1 tablet by mouth every morning.     . nitroGLYCERIN (NITROSTAT) 0.4 MG SL tablet Place 1 tablet (0.4 mg total) under the tongue every 5 (five) minutes as needed for chest pain. (Patient taking differently: Place 0.4 mg under the tongue every 5 (five) minutes as needed for chest pain. Last taken was day prior to hospitalization in october) 25 tablet 3  . omeprazole (PRILOSEC) 40 MG capsule Take 1 capsule (40 mg total) by mouth daily. 90 capsule 3  . ondansetron (ZOFRAN) 4 MG tablet Take 1 tablet (4 mg total) by mouth every 8 (eight) hours as needed for nausea or vomiting. 30 tablet 1  . potassium chloride SA (KLOR-CON M20) 20 MEQ tablet Take 1 tablet (20 mEq total) by mouth daily. 90 tablet 1  . ticagrelor (BRILINTA) 90 MG TABS tablet Take 1 tablet (90 mg total) by mouth 2 (two) times daily. 60 tablet 0   No current facility-administered medications for this visit.     Allergies:   Patient has no known allergies.    Social History:  The patient  reports that he quit smoking about 15 years ago. He has a 40.00 pack-year smoking history. He has never used smokeless tobacco. He reports that he does not drink alcohol or use drugs.   Family History:  The patient's family history includes Heart attack in his father; Heart disease in his sister; Hypertension in his mother and another family member.    ROS:  Please see the history of present illness.   Otherwise, review of systems are positive for none.   All other systems are reviewed and negative.     PHYSICAL EXAM: VS:  BP 115/79   Pulse 73   Temp (!) 97.2 F (36.2 C)   Ht 5\' 11"  (1.803 m)   Wt 237 lb (107.5 kg)   SpO2 97%   BMI 33.05 kg/m  , BMI Body mass index is 33.05 kg/m. GEN: Well nourished, well developed, in no acute distress  HEENT: normal  Neck: no JVD, carotid bruits, or masses Cardiac: RRR; no murmurs, rubs, or gallops,no edema  Respiratory:  clear to auscultation bilaterally, normal work of breathing GI: soft, nontender, nondistended, + BS MS: no deformity or atrophy  Skin: warm and dry, no rash Neuro:  Strength and sensation are intact Psych: euthymic mood, full affect Vascular: Femoral pulses +1 on the right and +2 on the left.  Left radial pulses normal.  He does have tenderness in the left arm.  EKG:  EKG is ordered today. EKG showed normal sinus rhythm with old inferior infarct.   Recent Labs: 01/23/2019: ALT 36; BUN 13; Creatinine, Ser 1.12; Hemoglobin 15.7; Platelets 234; Potassium 3.7; Sodium 135; TSH 2.076    Lipid Panel    Component Value Date/Time   CHOL 112 01/23/2019 0515   CHOL 126 01/17/2019 1009   TRIG 68 01/23/2019 0515   HDL 29 (L) 01/23/2019 0515   HDL 45 01/17/2019 1009   CHOLHDL 3.9 01/23/2019 0515   VLDL 14 01/23/2019 0515   LDLCALC 69 01/23/2019 0515   LDLCALC 68 01/17/2019 1009      Wt Readings from Last 3 Encounters:  02/04/19 237 lb (107.5 kg)  01/22/19 225 lb (102.1 kg)  08/20/18 215 lb (97.5 kg)       No flowsheet data found.    ASSESSMENT AND PLAN:  1.  Peripheral arterial disease with severe right leg claudication Rutherford class III: ABI was moderately reduced at 0.63.  Possible inflow disease on the right side.  His claudication seems to be slightly worse since last year.  I am going to repeat his lower extremity arterial Doppler.  He is now more open to the idea of angiography and endovascular intervention but wants to wait few months.  He is going to start attending cardiac rehab and he will closely  monitor his symptoms.    2.  Coronary artery disease involving native coronary arteries without angina: Recent angioplasty of the ostial left circumflex with improvement in angina.  Continue dual antiplatelet therapy.  High risk for restenosis and his symptoms need to be monitored closely.  I reviewed the cath images and I agree that the location is not optimal for stent placement without jailing the LAD.  3.  Chronic systolic heart failure: He appears to be euvolemic and currently on optimal medical therapy.  4.  Hyperlipidemia: Continue treatment with atorvastatin and Zetia with a target LDL of less than 70.  5.  Tobacco use: I congratulated him on smoking cessation  6.  Left arm pain post recent catheterization via the left radial artery.  He has a palpable left radial pulse with no hematoma.  He does have tenderness.  I instructed him to use short-term NSAIDs and can use a warm compress.    Disposition:   FU with me in 3 months  Signed,  [name], MD  02/07/2019 1:08 PM    [place]

## 2019-02-04 NOTE — Telephone Encounter (Signed)
New Message     Pt is in the office for an appt today  Pts wife is calling and says he has been having a lot of pain from the cath and they are wanting him to see Billy [name] or talk to her about something for pain    Please call

## 2019-02-04 NOTE — Telephone Encounter (Signed)
Spoke with pt wife. She states pt has been c/o pain to L arm since procedure and has been taking tylenol but this has not helped much. She also states that pt l arm swollen. Informed pt wife that triage nurse would reach out to Dr. Tyrell Antonio nurse to inform Dr. Fletcher Anon.   Dr. Fletcher Anon okay for pt to continue on tylenol and keep appt with Billy Sims, DNP on 11/5 to address pain and edema to arm. Advised that wife call pt during his 11/5 appt and listen in so she can discuss concerns with Billy Nails, DNP. Advised pt wife to monitor pt for increase in arm size, warmth, redness, decreased sensation/numbness, tingling, bruising/hematoma and can report to ED prior to appt. Also advised that pt may try cold or heat therapy for relief. Pt wife verbalized understanding.

## 2019-02-04 NOTE — Patient Instructions (Addendum)
Medication Instructions:  No changes *If you need a refill on your cardiac medications before your next appointment, please call your pharmacy*  Lab Work: None ordered If you have labs (blood work) drawn today and your tests are completely normal, you will receive your results only by: [name] MyChart Message (if you have MyChart) OR . A paper copy in the mail If you have any lab test that is abnormal or we need to change your treatment, we will call you to review the results.  Testing/Procedures: Your physician has requested that you have a lower extremity segmental doppler. This will take place at [address], [address].   Follow-Up: At [facility], you and your health needs are our priority.  As part of our continuing mission to provide you with exceptional heart care, we have created designated Provider Care Teams.  These Care Teams include your primary Cardiologist (physician) and Advanced Practice Providers (APPs -  Physician Assistants and Nurse Practitioners) who all work together to provide you with the care you need, when you need it.  Your next appointment:   3 months  The format for your next appointment:   In Person  Provider:   [name], MD

## 2019-02-06 ENCOUNTER — Ambulatory Visit: Payer: Medicare Other | Admitting: Adult Health

## 2019-02-06 ENCOUNTER — Telehealth (HOSPITAL_COMMUNITY): Payer: Self-pay | Admitting: *Deleted

## 2019-02-06 ENCOUNTER — Other Ambulatory Visit: Payer: Self-pay

## 2019-02-06 ENCOUNTER — Other Ambulatory Visit (HOSPITAL_COMMUNITY): Payer: Self-pay | Admitting: Cardiovascular Disease

## 2019-02-06 DIAGNOSIS — I739 Peripheral vascular disease, unspecified: Secondary | ICD-10-CM

## 2019-02-06 NOTE — Progress Notes (Deleted)
{Choose 1 Note Type (Telehealth Visit or Telephone Visit):[id number]}   Date:  02/06/2019   ID:  Billy [name], DOB [birth year], MRN [id number]  {Patient Location:[phone]::"Home"} {Provider Location:[phone]::"Home"}  PCP:  Patient, No Pcp Per  Cardiologist:  Kirk Ruths, MD Electrophysiologist:  None   Evaluation Performed:  {Choose Visit Type:[phone]::"Follow-Up Visit"}  Chief Complaint:  ***  History of Present Illness:    Billy [name] is a 53 y.o. male we are following for hospital follow-up, after admission for recurrent chest pain.  He had subsequent cardiac catheterization by Dr. Ellyn Hack which revealed severe two-vessel coronary artery disease, SVG to RCA, SVG to OM 2 were found to be occluded.  LIMA to LAD was found to be atretic with competitive flow to minimal disease in the native LAD.  SVG to diagonal was patent.  There was found in 80% ostial left circumflex stenosis which was treated with scoring balloon angioplasty.  This was accessed via the left radial artery and since then the patient has had significant discomfort in his arm.  This was evaluated by Dr. Fletcher Anon on recent office visit, and he was found to have palpable left radial pulse with no hematoma although he did have tenderness.  He was advised to use short-term NSAIDs and to use warm moist compresses.  Patient also has a history of ICD in situ ([place] single chamber), hypertension, tobacco abuse, hyperlipidemia, PAD with previous left SFA stent.  He is followed by Dr. Fletcher Anon for PAD and had complaints of severe right left claudication when seen by him 1 year previously.  Noninvasive vascular ABIs revealed a 0.63 on the right and 0.98 on the left.  Duplex ultrasound revealed patent left FSA stent.  On the right side there was moderate disease affecting the SFA.  It was felt by Dr. Fletcher Anon that his claudication was severe however the patient elected to have medical therapy.  Note, the patient had quit  smoking.  He was seen last by Dr. Fletcher Anon on 02/04/2019, and it was felt that his claudication symptoms had worsened since being seen last.  Lower extremity Doppler study was planned to be repeated.  The patient was more open to the possibility of having endovascular intervention if necessary but wanted to wait a few months after attending cardiac rehab.  He was continued on his current medication regimen.   The patient {does/does not:200015} have symptoms concerning for COVID-19 infection (fever, chills, cough, or new shortness of breath).    Past Medical History:  Diagnosis Date   Aortic atherosclerosis ([place])    on CXR   Blood in stool    C. difficile colitis    a. remote hx 2010.   Cardiac arrest - ventricular fibrillation 02/11/2009   a. 02/2009 s/p St Jude ICD.   Chronic systolic CHF (congestive heart failure) ([name])    Coronary artery disease    a. PCI to Cx age 89. b. CABGx4 in 2003. c. BMS to [place] in 12/2007.   Former tobacco use    Hyperlipidemia    Hypertension    Ischemic cardiomyopathy    Marijuana abuse    Myocardial infarct ([place])    NON-ST-SEGMENT ELEVATION MI   Obesity    PAD (peripheral artery disease) ([place])    a. RLE by noninvasive testing - managed medically for now.   Ventricular tachyarrhythmia ([name])    Ventricular tachycardia ([name])    Past Surgical History:  Procedure Laterality Date   CARDIAC DEFIBRILLATOR PLACEMENT     STJ single chamber  ICD implanted for secondary prevention   CIRCUMCISION     CORONARY ARTERY BYPASS GRAFT  06/17/01   X 4   CORONARY BALLOON ANGIOPLASTY N/A 02-09-2019   Procedure: CORONARY BALLOON ANGIOPLASTY;  Surgeon: Leonie Man, MD;  Location: [place] [facility];  Service: Cardiovascular;  Laterality: N/A;   LEFT HEART CATH AND CORS/GRAFTS ANGIOGRAPHY N/A 02-09-19   Procedure: LEFT HEART CATH AND CORS/GRAFTS ANGIOGRAPHY;  Surgeon: Leonie Man, MD;  Location: [place] [facility];  Service: Cardiovascular;   Laterality: N/A;     No outpatient medications have been marked as taking for the 02/06/19 encounter (Appointment) with Lendon Colonel, NP.     Allergies:   Patient has no known allergies.   Social History   Tobacco Use   Smoking status: Former Smoker    Packs/day: 2.00    Years: 20.00    Pack years: 40.00    Quit date: 04/04/2003    Years since quitting: 15.8   Smokeless tobacco: Never Used  Substance Use Topics   Alcohol use: No   Drug use: No     Family Hx: The patient's family history includes Heart attack in his father; Heart disease in his sister; Hypertension in his mother and another family member.  ROS:   Please see the history of present illness.    *** All other systems reviewed and are negative.   Prior CV studies:   The following studies were reviewed today: [facility] February 09, 2019  POTENTIAL CULPRIT LESION: Colon Flattery Cx lesion is 80% stenosed.  Scoring balloon angioplasty was performed using a BALLOON WOL[place]INE 3.00X6.  Post intervention, there is a 40% residual stenosis.  -----------------------------------  1st Diag lesion is 100% stenosed. - grafted.  Ost Cx to Prox Cx lesion is 35% stenosed.  Previously placed Prox Cx stent (unknown type) is widely patent - as it goes into 1st Mrg stent, where it is ~5% stenosed.  Prox Cx to Mid Cx lesion is 100% stenosed- within the Stent @ 1st Marg takeoff. Distal Cx-OM fill late va L-L collaterals.  Prox RCA lesion is 70% stenosed. Prox RCA to Mid RCA lesion is 85% stenosed. Mid RCA to Dist RCA lesion is 100% stenosed.  -----------GRAFTS-previously known 100% SVG-RCA & SVG-OM2---------------------  LIMA-LAD graft was visualized by angiography and is small/atretic due to competive LAD flow. Mid Graft lesion is 100% stenosed.  SVG-1st Diag graft was visualized by angiography and is normal in caliber. Origin lesion is 40% stenosed.  SVG-RCA graft was injected, but not visualized due to known occlusion. Origin to  Prox Graft lesion is 100% stenosed.  SVG-Cx(OM2) graft was not visualized due to known occlusion. Origin lesion is 100% stenosed.  -----------------HEMODYNAMICS-------------------------------  There is moderate left ventricular systolic dysfunction. The left ventricular ejection fraction is 35-45% by visual estimate. -Inferior akinesis-hypokinesis  LV end diastolic pressure is normal.   SUMMARY  Severe native vessel disease with known occlusion of mid RCA, AV groove LCx-OM2 (LPL 1) with also known occlusion of SVG-RCA and SVG-OM 2.  Newly documented atretic/underfilling LIMA-LAD with competitive flow due to minimal disease in the LAD  Patent SVG-D1 with ostial 40-50% stenosis  Ostial Circumflex 80% stenosis reduced to 40% with scoring balloon PTCA  Mild-mildly reduced EF with dilated LV.  EF estimated roughly 40-45% (with dense inferior hypokinesis); relatively normal LVEDP   Echocardiogram 01/23/2019  1. Left ventricular ejection fraction, by visual estimation, is 50 to 55%. The left ventricle has mildly decreased function. Moderately increased left ventricular size. There  is no left ventricular hypertrophy.  2. Mild inferior and apical hypokinesis  3. Left ventricular diastolic Doppler parameters are consistent with impaired relaxation pattern of LV diastolic filling.  4. Global right ventricle has mildly reduced systolic function.The right ventricular size is normal. No increase in right ventricular wall thickness.  5. Left atrial size was normal.  6. Right atrial size was normal.  7. The mitral valve is grossly normal. Trace mitral valve regurgitation.  8. The tricuspid valve is grossly normal. Tricuspid valve regurgitation was not visualized by color flow Doppler.  9. The aortic valve is tricuspid Aortic valve regurgitation was not visualized by color flow Doppler. 10. The pulmonic valve was grossly normal. Pulmonic valve regurgitation is not visualized by color flow  Doppler. 11. The inferior vena cava is normal in size with greater than 50% respiratory variability, suggesting right atrial pressure of 3 mmHg.  Labs/Other Tests and Data Reviewed:    EKG:  {EKG/Telemetry Strips [phone]}  Recent Labs: 01/23/2019: ALT 36; BUN 13; Creatinine, Ser 1.12; Hemoglobin 15.7; Platelets 234; Potassium 3.7; Sodium 135; TSH 2.076   Recent Lipid Panel Lab Results  Component Value Date/Time   CHOL 112 01/23/2019 05:15 AM   CHOL 126 01/17/2019 10:09 AM   TRIG 68 01/23/2019 05:15 AM   HDL 29 (L) 01/23/2019 05:15 AM   HDL 45 01/17/2019 10:09 AM   CHOLHDL 3.9 01/23/2019 05:15 AM   LDLCALC 69 01/23/2019 05:15 AM   LDLCALC 68 01/17/2019 10:09 AM    Wt Readings from Last 3 Encounters:  02/04/19 237 lb (107.5 kg)  01/22/19 225 lb (102.1 kg)  08/20/18 215 lb (97.5 kg)     Objective:    Vital Signs:  There were no vitals taken for this visit.   {HeartCare Virtual Exam (Optional):[phone]::"VITAL SIGNS:  reviewed"}  ASSESSMENT & PLAN:    1. ***  COVID-19 Education: The signs and symptoms of COVID-19 were discussed with the patient and how to seek care for testing (follow up with PCP or arrange E-visit).  ***The importance of social distancing was discussed today.  Time:   Today, I have spent *** minutes with the patient with telehealth technology discussing the above problems.     Medication Adjustments/Labs and Tests Ordered: Current medicines are reviewed at length with the patient today.  Concerns regarding medicines are outlined above.   Tests Ordered: No orders of the defined types were placed in this encounter.   Medication Changes: No orders of the defined types were placed in this encounter.   Disposition:  Follow up {follow up:15908}  Signed, Phill Myron. West Pugh, ANP, AACC  02/06/2019 7:51 AM    [facility]

## 2019-02-06 NOTE — Telephone Encounter (Signed)
Spoke with the patient completed health history over the phone. Patient says he plans to attend orientation on 02/11/19 providing he can get a ride.Barnet Pall, RN,BSN 02/06/2019 3:54 PM

## 2019-02-08 ENCOUNTER — Other Ambulatory Visit: Payer: Self-pay | Admitting: Cardiology

## 2019-02-08 DIAGNOSIS — Z951 Presence of aortocoronary bypass graft: Secondary | ICD-10-CM

## 2019-02-10 ENCOUNTER — Telehealth (HOSPITAL_COMMUNITY): Payer: Self-pay | Admitting: *Deleted

## 2019-02-10 NOTE — Telephone Encounter (Signed)
Spoke with Hassan Rowan. Waiting to see if daughter can give Mr Craver a ride to cardiac rehab orientation tomorrow.Barnet Pall, RN,BSN 02/10/2019 3:54 PM

## 2019-02-11 ENCOUNTER — Ambulatory Visit (HOSPITAL_COMMUNITY): Payer: Medicare Other

## 2019-02-11 ENCOUNTER — Telehealth (HOSPITAL_COMMUNITY): Payer: Self-pay | Admitting: *Deleted

## 2019-02-12 ENCOUNTER — Ambulatory Visit (HOSPITAL_COMMUNITY)
Admission: RE | Admit: 2019-02-12 | Discharge: 2019-02-12 | Disposition: A | Discharge status: Home / Self Care | Payer: Medicare Other | Source: Ambulatory Visit | Attending: Cardiovascular Disease | Admitting: Cardiovascular Disease

## 2019-02-12 ENCOUNTER — Other Ambulatory Visit (HOSPITAL_COMMUNITY): Payer: Self-pay | Admitting: Cardiovascular Disease

## 2019-02-12 ENCOUNTER — Other Ambulatory Visit: Payer: Self-pay

## 2019-02-12 DIAGNOSIS — I739 Peripheral vascular disease, unspecified: Secondary | ICD-10-CM

## 2019-02-15 ENCOUNTER — Other Ambulatory Visit: Payer: Self-pay

## 2019-02-15 ENCOUNTER — Encounter (HOSPITAL_COMMUNITY): Payer: Self-pay | Admitting: *Deleted

## 2019-02-15 ENCOUNTER — Emergency Department (HOSPITAL_COMMUNITY): Payer: Medicare Other

## 2019-02-15 ENCOUNTER — Observation Stay (HOSPITAL_COMMUNITY)
Admission: EM | Admit: 2019-02-15 | Discharge: 2019-02-17 | Disposition: A | Discharge status: Home / Self Care | Payer: Medicare Other | Attending: Internal Medicine | Admitting: Internal Medicine

## 2019-02-15 DIAGNOSIS — Z95810 Presence of automatic (implantable) cardiac defibrillator: Secondary | ICD-10-CM | POA: Diagnosis present

## 2019-02-15 DIAGNOSIS — Z7982 Long term (current) use of aspirin: Secondary | ICD-10-CM | POA: Insufficient documentation

## 2019-02-15 DIAGNOSIS — I10 Essential (primary) hypertension: Secondary | ICD-10-CM | POA: Diagnosis present

## 2019-02-15 DIAGNOSIS — E669 Obesity, unspecified: Secondary | ICD-10-CM | POA: Insufficient documentation

## 2019-02-15 DIAGNOSIS — E785 Hyperlipidemia, unspecified: Secondary | ICD-10-CM | POA: Diagnosis present

## 2019-02-15 DIAGNOSIS — Z6833 Body mass index (BMI) 33.0-33.9, adult: Secondary | ICD-10-CM | POA: Insufficient documentation

## 2019-02-15 DIAGNOSIS — I255 Ischemic cardiomyopathy: Secondary | ICD-10-CM | POA: Diagnosis present

## 2019-02-15 DIAGNOSIS — Z79899 Other long term (current) drug therapy: Secondary | ICD-10-CM | POA: Insufficient documentation

## 2019-02-15 DIAGNOSIS — R0602 Shortness of breath: Secondary | ICD-10-CM | POA: Diagnosis not present

## 2019-02-15 DIAGNOSIS — N182 Chronic kidney disease, stage 2 (mild): Secondary | ICD-10-CM | POA: Insufficient documentation

## 2019-02-15 DIAGNOSIS — Z9862 Peripheral vascular angioplasty status: Secondary | ICD-10-CM

## 2019-02-15 DIAGNOSIS — Z7902 Long term (current) use of antithrombotics/antiplatelets: Secondary | ICD-10-CM | POA: Insufficient documentation

## 2019-02-15 DIAGNOSIS — R079 Chest pain, unspecified: Secondary | ICD-10-CM | POA: Diagnosis not present

## 2019-02-15 DIAGNOSIS — Z87891 Personal history of nicotine dependence: Secondary | ICD-10-CM | POA: Insufficient documentation

## 2019-02-15 DIAGNOSIS — Z951 Presence of aortocoronary bypass graft: Secondary | ICD-10-CM | POA: Diagnosis not present

## 2019-02-15 DIAGNOSIS — I4891 Unspecified atrial fibrillation: Secondary | ICD-10-CM

## 2019-02-15 DIAGNOSIS — I739 Peripheral vascular disease, unspecified: Secondary | ICD-10-CM | POA: Diagnosis present

## 2019-02-15 DIAGNOSIS — I480 Paroxysmal atrial fibrillation: Secondary | ICD-10-CM | POA: Diagnosis not present

## 2019-02-15 DIAGNOSIS — Z20828 Contact with and (suspected) exposure to other viral communicable diseases: Secondary | ICD-10-CM | POA: Diagnosis not present

## 2019-02-15 DIAGNOSIS — K219 Gastro-esophageal reflux disease without esophagitis: Secondary | ICD-10-CM | POA: Diagnosis present

## 2019-02-15 DIAGNOSIS — I5022 Chronic systolic (congestive) heart failure: Secondary | ICD-10-CM | POA: Insufficient documentation

## 2019-02-15 DIAGNOSIS — I252 Old myocardial infarction: Secondary | ICD-10-CM | POA: Diagnosis not present

## 2019-02-15 DIAGNOSIS — I2510 Atherosclerotic heart disease of native coronary artery without angina pectoris: Secondary | ICD-10-CM | POA: Insufficient documentation

## 2019-02-15 DIAGNOSIS — I471 Supraventricular tachycardia, unspecified: Secondary | ICD-10-CM

## 2019-02-15 DIAGNOSIS — I130 Hypertensive heart and chronic kidney disease with heart failure and stage 1 through stage 4 chronic kidney disease, or unspecified chronic kidney disease: Secondary | ICD-10-CM | POA: Diagnosis not present

## 2019-02-15 DIAGNOSIS — E7800 Pure hypercholesterolemia, unspecified: Secondary | ICD-10-CM | POA: Diagnosis present

## 2019-02-15 LAB — CBC
HCT: 43.4 % (ref 39.0–52.0)
Hemoglobin: 15.6 g/dL (ref 13.0–17.0)
MCH: 29.5 pg (ref 26.0–34.0)
MCHC: 35.9 g/dL (ref 30.0–36.0)
MCV: 82.2 fL (ref 80.0–100.0)
Platelets: 262 10*3/uL (ref 150–400)
RBC: 5.28 MIL/uL (ref 4.22–5.81)
RDW: 14.2 % (ref 11.5–15.5)
WBC: 13 10*3/uL — ABNORMAL HIGH (ref 4.0–10.5)
nRBC: 0 % (ref 0.0–0.2)

## 2019-02-15 LAB — HEPATIC FUNCTION PANEL
ALT: 43 U/L (ref 0–44)
AST: 29 U/L (ref 15–41)
Albumin: 3.8 g/dL (ref 3.5–5.0)
Alkaline Phosphatase: 78 U/L (ref 38–126)
Bilirubin, Direct: 0.1 mg/dL (ref 0.0–0.2)
Indirect Bilirubin: 0.6 mg/dL (ref 0.3–0.9)
Total Bilirubin: 0.7 mg/dL (ref 0.3–1.2)
Total Protein: 7.1 g/dL (ref 6.5–8.1)

## 2019-02-15 LAB — BASIC METABOLIC PANEL
Anion gap: 11 (ref 5–15)
BUN: 17 mg/dL (ref 6–20)
CO2: 25 mmol/L (ref 22–32)
Calcium: 9.5 mg/dL (ref 8.9–10.3)
Chloride: 100 mmol/L (ref 98–111)
Creatinine, Ser: 1.27 mg/dL — ABNORMAL HIGH (ref 0.61–1.24)
GFR calc Af Amer: >60 mL/min (ref >60)
GFR calc non Af Amer: >60 mL/min (ref >60)
Glucose, Bld: 106 mg/dL — ABNORMAL HIGH (ref 70–99)
Potassium: 3.9 mmol/L (ref 3.5–5.1)
Sodium: 136 mmol/L (ref 135–145)

## 2019-02-15 LAB — TROPONIN I (HIGH SENSITIVITY)
Troponin I (High Sensitivity): 10 ng/L (ref <18)
Troponin I (High Sensitivity): 11 ng/L (ref <18)

## 2019-02-15 LAB — BRAIN NATRIURETIC PEPTIDE: B Natriuretic Peptide: 52.4 pg/mL (ref 0.0–100.0)

## 2019-02-15 LAB — MAGNESIUM: Magnesium: 1.9 mg/dL (ref 1.7–2.4)

## 2019-02-15 LAB — D-DIMER, QUANTITATIVE: D-Dimer, Quant: 0.33 ug/mL-FEU (ref 0.00–0.50)

## 2019-02-15 MED ORDER — ONDANSETRON HCL 4 MG/2ML IJ SOLN: 4.0000 mg | Freq: Four times a day (QID) | INTRAMUSCULAR | Status: DC | PRN

## 2019-02-15 MED ORDER — EZETIMIBE 10 MG PO TABS
10.0000 mg | ORAL_TABLET | Freq: Every day | ORAL | Status: DC
  Administered 2019-02-16 (×2): 10 mg via ORAL
  Filled 2019-02-15 (×2): qty 1

## 2019-02-15 MED ORDER — TICAGRELOR 90 MG PO TABS
90.0000 mg | ORAL_TABLET | Freq: Two times a day (BID) | ORAL | Status: DC
  Administered 2019-02-15 – 2019-02-16 (×2): 90 mg via ORAL
  Filled 2019-02-15 (×2): qty 1

## 2019-02-15 MED ORDER — POTASSIUM CHLORIDE CRYS ER 20 MEQ PO TBCR
20.0000 meq | EXTENDED_RELEASE_TABLET | Freq: Every day | ORAL | Status: DC
  Administered 2019-02-15 – 2019-02-16 (×2): 20 meq via ORAL
  Filled 2019-02-15 (×2): qty 1

## 2019-02-15 MED ORDER — ALPRAZOLAM 0.25 MG PO TABS: 0.2500 mg | ORAL_TABLET | Freq: Two times a day (BID) | ORAL | Status: DC | PRN

## 2019-02-15 MED ORDER — ONDANSETRON HCL 4 MG PO TABS: 4.0000 mg | ORAL_TABLET | Freq: Three times a day (TID) | ORAL | Status: DC | PRN

## 2019-02-15 MED ORDER — ASPIRIN EC 81 MG PO TBEC
81.0000 mg | DELAYED_RELEASE_TABLET | Freq: Every morning | ORAL | Status: DC
  Administered 2019-02-16: 10:00:00 81 mg via ORAL
  Filled 2019-02-15 (×2): qty 1

## 2019-02-15 MED ORDER — LISINOPRIL-HYDROCHLOROTHIAZIDE 20-25 MG PO TABS: 1.0000 | ORAL_TABLET | Freq: Every day | ORAL | Status: DC

## 2019-02-15 MED ORDER — PANTOPRAZOLE SODIUM 40 MG PO TBEC
40.0000 mg | DELAYED_RELEASE_TABLET | Freq: Every day | ORAL | Status: DC
  Administered 2019-02-16 – 2019-02-17 (×2): 40 mg via ORAL
  Filled 2019-02-15 (×3): qty 1

## 2019-02-15 MED ORDER — SODIUM CHLORIDE 0.9% FLUSH
3.0000 mL | Freq: Once | INTRAVENOUS | Status: AC
  Administered 2019-02-15: 21:00:00 3 mL via INTRAVENOUS

## 2019-02-15 MED ORDER — LIDOCAINE VISCOUS HCL 2 % MT SOLN
15.0000 mL | Freq: Once | OROMUCOSAL | Status: DC
  Filled 2019-02-15: qty 15

## 2019-02-15 MED ORDER — ISOSORBIDE MONONITRATE ER 30 MG PO TB24
30.0000 mg | ORAL_TABLET | Freq: Every day | ORAL | Status: DC
  Administered 2019-02-16: 10:00:00 30 mg via ORAL
  Filled 2019-02-15: qty 1

## 2019-02-15 MED ORDER — CARVEDILOL 25 MG PO TABS
25.0000 mg | ORAL_TABLET | Freq: Two times a day (BID) | ORAL | Status: DC
  Administered 2019-02-15 – 2019-02-17 (×4): 25 mg via ORAL
  Filled 2019-02-15: qty 1
  Filled 2019-02-15: qty 2
  Filled 2019-02-15 (×2): qty 1

## 2019-02-15 MED ORDER — ASPIRIN 81 MG PO CHEW
324.0000 mg | CHEWABLE_TABLET | Freq: Once | ORAL | Status: AC
  Administered 2019-02-15: 22:00:00 324 mg via ORAL
  Filled 2019-02-15: qty 4

## 2019-02-15 MED ORDER — ZOLPIDEM TARTRATE 5 MG PO TABS
5.0000 mg | ORAL_TABLET | Freq: Every evening | ORAL | Status: DC | PRN
  Administered 2019-02-16 (×2): 5 mg via ORAL
  Filled 2019-02-15 (×2): qty 1

## 2019-02-15 MED ORDER — FUROSEMIDE 20 MG PO TABS
20.0000 mg | ORAL_TABLET | Freq: Every day | ORAL | Status: DC
  Administered 2019-02-16 – 2019-02-17 (×2): 20 mg via ORAL
  Filled 2019-02-15 (×3): qty 1

## 2019-02-15 MED ORDER — ALUM & MAG HYDROXIDE-SIMETH 200-200-20 MG/5ML PO SUSP
30.0000 mL | Freq: Once | ORAL | Status: DC
  Filled 2019-02-15: qty 30

## 2019-02-15 MED ORDER — ATORVASTATIN CALCIUM 80 MG PO TABS
80.0000 mg | ORAL_TABLET | Freq: Every day | ORAL | Status: DC
  Administered 2019-02-15 – 2019-02-16 (×2): 80 mg via ORAL
  Filled 2019-02-15 (×2): qty 1

## 2019-02-15 MED ORDER — ENOXAPARIN SODIUM 40 MG/0.4ML ~~LOC~~ SOLN
40.0000 mg | SUBCUTANEOUS | Status: DC
  Administered 2019-02-15: 40 mg via SUBCUTANEOUS
  Filled 2019-02-15: qty 0.4

## 2019-02-15 MED ORDER — MONTELUKAST SODIUM 10 MG PO TABS
10.0000 mg | ORAL_TABLET | Freq: Every day | ORAL | Status: DC
  Administered 2019-02-16 – 2019-02-17 (×2): 10 mg via ORAL
  Filled 2019-02-15 (×2): qty 1

## 2019-02-15 MED ORDER — ACETAMINOPHEN 325 MG PO TABS: 650.0000 mg | ORAL_TABLET | ORAL | Status: DC | PRN

## 2019-02-15 MED ORDER — ADULT MULTIVITAMIN W/MINERALS CH
1.0000 | ORAL_TABLET | Freq: Every day | ORAL | Status: DC
  Administered 2019-02-16 – 2019-02-17 (×2): 1 via ORAL
  Filled 2019-02-15 (×3): qty 1

## 2019-02-15 MED ORDER — DICYCLOMINE HCL 20 MG PO TABS: 20.0000 mg | ORAL_TABLET | Freq: Two times a day (BID) | ORAL | Status: DC | PRN

## 2019-02-15 MED ORDER — NITROGLYCERIN 0.4 MG SL SUBL
0.4000 mg | SUBLINGUAL_TABLET | SUBLINGUAL | Status: DC | PRN
  Administered 2019-02-16 (×2): 0.4 mg via SUBLINGUAL
  Filled 2019-02-15 (×2): qty 1

## 2019-02-15 NOTE — ED Triage Notes (Signed)
The pt is c/o chest pain and sob for 3 weeks since he had a balloon surgery   He came in since the pain was not getting better

## 2019-02-15 NOTE — ED Provider Notes (Signed)
[facility] EMERGENCY DEPARTMENT Provider Note   CSN: [id number] Arrival date & time: 02/15/19  1828     History   Chief Complaint Chief Complaint  Patient presents with   Chest Pain    HPI Billy [name] is a 53 y.o. male.  HPI   Patient presents today for chest pain that is worse over the last couple days when he lays flat.  He states that it is a achy feeling that radiates to his left arm, with associated symptoms of nausea.  He recently had a balloon angioplasty 3 weeks ago for an 80% stenosed left circumflex artery.  He has an extensive cardiac history including cardiac arrest with ventricular fibrillation in 2010, status post [place] ICD, CABG x4 in 2003, aortic atherosclerosis.  He has not tried anything for symptoms, nothing seems to make his symptoms better, he has not followed up with cardiology since his last hospitalization 3 weeks ago, he has not missed any doses of his blood thinner or other medications.  Of note, patient reports that he met again at least 10 pounds since 3 weeks ago, that he suspects may be water weight.  Past Medical History:  Diagnosis Date   Aortic atherosclerosis ([place])    on CXR   Blood in stool    C. difficile colitis    a. remote hx 2010.   Cardiac arrest - ventricular fibrillation 02/11/2009   a. 02/2009 s/p St Jude ICD.   Chronic systolic CHF (congestive heart failure) ([name])    Coronary artery disease    a. PCI to Cx age 64. b. CABGx4 in 2003. c. BMS to [place] in 12/2007.   Former tobacco use    Hyperlipidemia    Hypertension    Ischemic cardiomyopathy    Marijuana abuse    Myocardial infarct ([place])    NON-ST-SEGMENT ELEVATION MI   Obesity    PAD (peripheral artery disease) ([place])    a. RLE by noninvasive testing - managed medically for now.   Ventricular tachyarrhythmia ([place])    Ventricular tachycardia [facility])     Patient Active Problem List   Diagnosis Date Noted   Unstable angina ([place])  01/22/2019   Coronary artery disease involving native coronary artery of native heart with unstable angina pectoris ([place]) 01/22/2019   Irritable bowel syndrome 08/23/2017   Seasonal allergies 04/19/2017   S/P quadruple vessel bypass 04/19/2017   Prediabetes 04/19/2017   Dysuria 06/06/2016   GERD (gastroesophageal reflux disease) 01/31/2016   Implantable cardioverter-defibrillator (ICD) in situ 06/12/2011   VENTRICULAR FIBRILLATION 03/02/2009   Cardiomyopathy, ischemic 08/25/2008   Hyperlipidemia 01/15/2008   Essential hypertension 01/15/2008   CORONARY ARTERY DISEASE 01/15/2008   PERIPHERAL VASCULAR DISEASE 01/15/2008   HEMATOCHEZIA 01/15/2008   CHEST PAIN 01/15/2008    Past Surgical History:  Procedure Laterality Date   CARDIAC DEFIBRILLATOR PLACEMENT     STJ single chamber ICD implanted for secondary prevention   CIRCUMCISION     CORONARY ARTERY BYPASS GRAFT  06/17/01   X 4   CORONARY BALLOON ANGIOPLASTY N/A 01/22/2019   Procedure: CORONARY BALLOON ANGIOPLASTY;  Surgeon: Leonie Man, MD;  Location: [facility];  Service: Cardiovascular;  Laterality: N/A;   LEFT HEART CATH AND CORS/GRAFTS ANGIOGRAPHY N/A 01/22/2019   Procedure: LEFT HEART CATH AND CORS/GRAFTS ANGIOGRAPHY;  Surgeon: Leonie Man, MD;  Location: [facility];  Service: Cardiovascular;  Laterality: N/A;        Home Medications    Prior to  Admission medications   Medication Sig Start Date End Date Taking? Authorizing Provider  aspirin 81 MG EC tablet Take 81 mg by mouth every morning.    Yes [provider]  atorvastatin (LIPITOR) 80 MG tablet TAKE 1 TABLET BY MOUTH EVERY DAY Patient taking differently: Take 80 mg by mouth at bedtime.  02/10/19  Yes Lelon Perla, MD  carvedilol (COREG) 25 MG tablet Take 1 tablet (25 mg total) by mouth 2 (two) times daily. 01/21/19  Yes Lelon Perla, MD  dicyclomine (BENTYL) 20 MG tablet TAKE 1 TABLET BY MOUTH TWICE A  DAY Patient taking differently: Take 20 mg by mouth 2 (two) times daily as needed for spasms.  04/01/18  Yes Charlott Rakes, MD  ezetimibe (ZETIA) 10 MG tablet Take 1 tablet (10 mg total) by mouth daily. Patient taking differently: Take 10 mg by mouth at bedtime.  01/22/19  Yes Lelon Perla, MD  furosemide (LASIX) 20 MG tablet TAKE 1 TABLET BY MOUTH EVERY DAY Patient taking differently: Take 20 mg by mouth daily.  11/29/18  Yes Lelon Perla, MD  isosorbide mononitrate (IMDUR) 30 MG 24 hr tablet Take 1 tablet (30 mg total) by mouth daily. 09/30/18 03/29/19 Yes Lelon Perla, MD  lisinopril-hydrochlorothiazide (ZESTORETIC) 20-25 MG tablet TAKE 1 TABLET BY MOUTH EVERY DAY Patient taking differently: Take 1 tablet by mouth daily.  02/10/19  Yes Lelon Perla, MD  montelukast (SINGULAIR) 10 MG tablet Take 1 tablet (10 mg total) by mouth at bedtime. Patient taking differently: Take 10 mg by mouth daily.  08/23/17  Yes Charlott Rakes, MD  Multiple Vitamin (MULTIVITAMIN WITH MINERALS) TABS tablet Take 1 tablet by mouth daily.   Yes [provider]  nitroGLYCERIN (NITROSTAT) 0.4 MG SL tablet Place 1 tablet (0.4 mg total) under the tongue every 5 (five) minutes as needed for chest pain. 05/31/18  Yes Lelon Perla, MD  omeprazole (PRILOSEC) 40 MG capsule Take 1 capsule (40 mg total) by mouth daily. Patient taking differently: Take 40 mg by mouth daily as needed (acid reflux/indigestion).  06/03/18  Yes Lelon Perla, MD  ondansetron (ZOFRAN) 4 MG tablet Take 1 tablet (4 mg total) by mouth every 8 (eight) hours as needed for nausea or vomiting. 08/23/17  Yes Newlin, Enobong, MD  potassium chloride SA (KLOR-CON M20) 20 MEQ tablet Take 1 tablet (20 mEq total) by mouth daily. Patient taking differently: Take 20 mEq by mouth at bedtime.  05/31/18  Yes Lelon Perla, MD  ticagrelor (BRILINTA) 90 MG TABS tablet Take 1 tablet (90 mg total) by mouth 2 (two) times daily. 01/23/19  Yes  Mosetta Anis, MD    Family History Family History  Problem Relation Age of Onset   Hypertension Mother    Heart attack Father        DIED AT 22 FROM MI   Heart disease Sister        CAD AND PREVIOUS CABG   Hypertension Other        IN MOST OF HIS SIBLINGS    Social History Social History   Tobacco Use   Smoking status: Former Smoker    Packs/day: 2.00    Years: 20.00    Pack years: 40.00    Quit date: 04/04/2003    Years since quitting: 15.8   Smokeless tobacco: Never Used  Substance Use Topics   Alcohol use: No   Drug use: No     Allergies   Patient has  no known allergies.   Review of Systems Review of Systems  Constitutional: Negative for activity change, appetite change and fever.  Respiratory: Negative for shortness of breath.   Cardiovascular: Positive for chest pain. Negative for leg swelling.  Gastrointestinal: Negative for abdominal pain, nausea and vomiting.  Skin: Negative for rash and wound.  Neurological: Negative for syncope.  All other systems reviewed and are negative.    Physical Exam Updated Vital Signs BP 133/85    Pulse 61    Temp 97.9 F (36.6 C) (Oral)    Resp 19    Ht '5\' 11"'  (1.803 m)    Wt 106.6 kg    SpO2 100%    BMI 32.78 kg/m   Physical Exam Vitals signs and nursing note reviewed.  Constitutional:      Appearance: He is well-developed.  HENT:     Head: Normocephalic and atraumatic.  Eyes:     Extraocular Movements: Extraocular movements intact.     Conjunctiva/sclera: Conjunctivae normal.  Neck:     Musculoskeletal: Neck supple.  Cardiovascular:     Rate and Rhythm: Normal rate and regular rhythm.     Heart sounds: Murmur present.  Pulmonary:     Effort: Pulmonary effort is normal.     Breath sounds: Normal breath sounds.     Comments: Patient satting well on room air Chest:     Comments: No chest wall tenderness to palpation Abdominal:     Palpations: Abdomen is soft.     Tenderness: There is no abdominal  tenderness.  Musculoskeletal: Normal range of motion.  Skin:    General: Skin is warm and dry.     Capillary Refill: Capillary refill takes less than 2 seconds.  Neurological:     General: No focal deficit present.     Mental Status: He is alert and oriented to person, place, and time.      ED Treatments / Results  Labs (all labs ordered are listed, but only abnormal results are displayed) Labs Reviewed  BASIC METABOLIC PANEL - Abnormal; Notable for the following components:      Result Value   Glucose, Bld 106 (*)    Creatinine, Ser 1.27 (*)    All other components within normal limits  CBC - Abnormal; Notable for the following components:   WBC 13.0 (*)    All other components within normal limits  SARS CORONAVIRUS 2 (TAT 6-24 HRS)  BRAIN NATRIURETIC PEPTIDE  HEPATIC FUNCTION PANEL  MAGNESIUM  D-DIMER, QUANTITATIVE (NOT AT [facility])  TROPONIN I (HIGH SENSITIVITY)  TROPONIN I (HIGH SENSITIVITY)    EKG EKG Interpretation  Date/Time:  Saturday February 15 2019 18:31:10 EST Ventricular Rate:  64 PR Interval:  126 QRS Duration: 96 QT Interval:  404 QTC Calculation: 416 R Axis:   63 Text Interpretation: Normal sinus rhythm Normal ECG similar to previous Confirmed by Theotis Burrow [phone]) on 02/15/2019 7:24:07 PM   Radiology Dg Chest 2 View  Result Date: 02/15/2019 CLINICAL DATA:  Chest pain and shortness of breath for 3 weeks. EXAM: CHEST - 2 VIEW COMPARISON:  01/22/2019 FINDINGS: AICD noted. Prior CABG. Borderline enlargement of the cardiopericardial silhouette, without edema. The lungs appear clear. No blunting of the costophrenic angles. IMPRESSION: Borderline enlargement of the cardiopericardial silhouette, without edema. Otherwise, no significant abnormalities are observed. Electronically Signed   By: Van Clines M.D.   On: 02/15/2019 19:19   Dg Chest Portable 1 View  Result Date: 02/15/2019 CLINICAL DATA:  Pulmonary edema.  Chest  pain, shortness of breath  EXAM: PORTABLE CHEST 1 VIEW COMPARISON:  02/15/2019 FINDINGS: Left AICD remains in place, unchanged. Mild cardiomegaly. Prior CABG. No edema or effusions. No acute bony abnormality. IMPRESSION: Mild cardiomegaly.  No active disease. Electronically Signed   By: Rolm Baptise M.D.   On: 02/15/2019 19:50    Procedures Procedures (including critical care time)  Medications Ordered in ED Medications  sodium chloride flush (NS) 0.9 % injection 3 mL (has no administration in time range)     Initial Impression / Assessment and Plan / ED Course  I have reviewed the triage vital signs and the nursing notes.  Pertinent labs & imaging results that were available during my care of the patient were reviewed by me and considered in my medical decision making (see chart for details).     HEAR Score: 5  Billy [name] is a 53 y.o. male presents today for chest pain that is worse over the last couple days when he lays flat.  Labs and imaging ordered for differential diagnosis of ACS, PE, electrolyte abnormality, anemia, pulmonary edema, CHF.  Delta troponin negative, D-dimer negative, no significant electrolyte abnormality, anemia, leukocytosis, BNP normal.  Considering extensive cardiac history, will admit for observation to medicine considering ongoing chest pain when he laid flat.  Aspirin 324 mg has been given and patient has not had chest pain while sitting up on the emergency department.  Concern for pericarditis considering positional changes affecting chest pain, so consider anti-inflammatories.  Care of patient was discussed with the supervising attending.  Final Clinical Impressions(s) / ED Diagnoses   Final diagnoses:  Chest pain, unspecified type    ED Discharge Orders    None       Julianne Rice, MD 02/15/19 2334    Little, Wenda Overland, MD 02/16/19 [phone]

## 2019-02-15 NOTE — ED Triage Notes (Signed)
The pt has numerus health problems chf he has a aicd pist cabg bloacage in his rt leg

## 2019-02-16 DIAGNOSIS — E7800 Pure hypercholesterolemia, unspecified: Secondary | ICD-10-CM | POA: Diagnosis not present

## 2019-02-16 DIAGNOSIS — Z95810 Presence of automatic (implantable) cardiac defibrillator: Secondary | ICD-10-CM

## 2019-02-16 DIAGNOSIS — I130 Hypertensive heart and chronic kidney disease with heart failure and stage 1 through stage 4 chronic kidney disease, or unspecified chronic kidney disease: Secondary | ICD-10-CM | POA: Diagnosis not present

## 2019-02-16 DIAGNOSIS — I255 Ischemic cardiomyopathy: Secondary | ICD-10-CM

## 2019-02-16 DIAGNOSIS — I5022 Chronic systolic (congestive) heart failure: Secondary | ICD-10-CM | POA: Diagnosis not present

## 2019-02-16 DIAGNOSIS — E669 Obesity, unspecified: Secondary | ICD-10-CM | POA: Diagnosis not present

## 2019-02-16 DIAGNOSIS — Z9862 Peripheral vascular angioplasty status: Secondary | ICD-10-CM | POA: Diagnosis not present

## 2019-02-16 DIAGNOSIS — I471 Supraventricular tachycardia: Secondary | ICD-10-CM

## 2019-02-16 DIAGNOSIS — R079 Chest pain, unspecified: Secondary | ICD-10-CM | POA: Diagnosis not present

## 2019-02-16 DIAGNOSIS — Z951 Presence of aortocoronary bypass graft: Secondary | ICD-10-CM | POA: Diagnosis not present

## 2019-02-16 DIAGNOSIS — I4891 Unspecified atrial fibrillation: Secondary | ICD-10-CM

## 2019-02-16 DIAGNOSIS — I480 Paroxysmal atrial fibrillation: Secondary | ICD-10-CM | POA: Diagnosis not present

## 2019-02-16 DIAGNOSIS — I739 Peripheral vascular disease, unspecified: Secondary | ICD-10-CM | POA: Diagnosis not present

## 2019-02-16 DIAGNOSIS — N182 Chronic kidney disease, stage 2 (mild): Secondary | ICD-10-CM | POA: Diagnosis not present

## 2019-02-16 DIAGNOSIS — Z20828 Contact with and (suspected) exposure to other viral communicable diseases: Secondary | ICD-10-CM | POA: Diagnosis not present

## 2019-02-16 DIAGNOSIS — I10 Essential (primary) hypertension: Secondary | ICD-10-CM | POA: Diagnosis not present

## 2019-02-16 DIAGNOSIS — R0602 Shortness of breath: Secondary | ICD-10-CM | POA: Diagnosis not present

## 2019-02-16 DIAGNOSIS — I2510 Atherosclerotic heart disease of native coronary artery without angina pectoris: Secondary | ICD-10-CM | POA: Diagnosis not present

## 2019-02-16 LAB — SARS CORONAVIRUS 2 (TAT 6-24 HRS): SARS Coronavirus 2: NEGATIVE

## 2019-02-16 LAB — MRSA PCR SCREENING: MRSA by PCR: NEGATIVE

## 2019-02-16 MED ORDER — LISINOPRIL 20 MG PO TABS
20.0000 mg | ORAL_TABLET | Freq: Every day | ORAL | Status: DC
  Administered 2019-02-16 – 2019-02-17 (×2): 20 mg via ORAL
  Filled 2019-02-16 (×2): qty 1

## 2019-02-16 MED ORDER — HYDROCHLOROTHIAZIDE 25 MG PO TABS
25.0000 mg | ORAL_TABLET | Freq: Every day | ORAL | Status: DC
  Administered 2019-02-16: 10:00:00 25 mg via ORAL
  Filled 2019-02-16: qty 1

## 2019-02-16 MED ORDER — CLOPIDOGREL BISULFATE 75 MG PO TABS
75.0000 mg | ORAL_TABLET | Freq: Every day | ORAL | Status: DC
  Administered 2019-02-17: 75 mg via ORAL
  Filled 2019-02-16: qty 1

## 2019-02-16 MED ORDER — DILTIAZEM HCL 25 MG/5ML IV SOLN
10.0000 mg | Freq: Once | INTRAVENOUS | Status: DC
  Filled 2019-02-16: qty 5

## 2019-02-16 MED ORDER — ISOSORBIDE MONONITRATE ER 30 MG PO TB24: 60.0000 mg | ORAL_TABLET | Freq: Every day | ORAL | 0 refills | Status: DC

## 2019-02-16 MED ORDER — CLOPIDOGREL BISULFATE 300 MG PO TABS: 300.0000 mg | ORAL_TABLET | Freq: Once | ORAL | Status: DC

## 2019-02-16 MED ORDER — APIXABAN 5 MG PO TABS
5.0000 mg | ORAL_TABLET | Freq: Two times a day (BID) | ORAL | Status: DC
  Administered 2019-02-16 – 2019-02-17 (×3): 5 mg via ORAL
  Filled 2019-02-16 (×3): qty 1

## 2019-02-16 MED ORDER — ISOSORBIDE MONONITRATE ER 60 MG PO TB24
60.0000 mg | ORAL_TABLET | Freq: Every day | ORAL | Status: DC
  Administered 2019-02-17: 10:00:00 60 mg via ORAL
  Filled 2019-02-16: qty 1

## 2019-02-16 MED ORDER — ALUM & MAG HYDROXIDE-SIMETH 200-200-20 MG/5ML PO SUSP
30.0000 mL | ORAL | Status: DC | PRN
  Administered 2019-02-16: 20:00:00 30 mL via ORAL
  Filled 2019-02-16: qty 30

## 2019-02-16 MED ORDER — CLOPIDOGREL BISULFATE 300 MG PO TABS
600.0000 mg | ORAL_TABLET | Freq: Once | ORAL | Status: AC
  Administered 2019-02-16: 21:00:00 600 mg via ORAL
  Filled 2019-02-16: qty 2

## 2019-02-16 MED ORDER — CLOPIDOGREL BISULFATE 75 MG PO TABS: 75.0000 mg | ORAL_TABLET | Freq: Every day | ORAL | 3 refills | Status: DC

## 2019-02-16 NOTE — Discharge Summary (Deleted)
Patient seen and examined, admitted earlier this morning by Dr.Cristescu,  Briefly Cosme Nardolillo is a 53 y.o. male with medical history significant of  Coronary artery disease, status post CABG, prior VT and V. fib cardiac arrest 02/2009 status post ICD ischemic cardiomyopathy CHF, EF of 40%,  hypertension hypercholesterolemia, PAD, came with a chief complaint of chest discomfort primarily when he lays down, ongoing foot intermittently for the last couple of weeks. He has extensive CAD with multiple known occlusion of his grafts, he had a balloon angioplasty of circumflex artery on 01/22/2019, reports feeling well few days after the procedure however about a week later started having chest discomfort.  Atypical chest pain -EKG is unremarkable, has mild T wave inversion in inferior leads which is unchanged from prior, high-sensitivity troponin is negative x2 -Clinically no evidence of ACS at this time -Known CAD, see discussion below increase Imdur dose  New atrial fibrillation -Just went into atrial fib this morning,  -Last echo 3 weeks ago 10/22 with improved EF to 50/55% -On very high doses of Coreg already, CCB may be an option with improvement in EF, will defer to cardiology, also anticoagulation will be challenging given dual antiplatelet therapy -Neurology consult requested  CAD/CABG/ischemic cardiomyopathy -As discussed above, remains on aspirin, Brilinta, Coreg, statin -Cath 3 weeks ago, occlusion of SVG to RCA and SVG to OM 2, balloon angioplasty of left circumflex stenosis 10/21 -Increase Imdur dose  CKD stage II Stable  Rest of his medical problems are stable as noted by Dr.Cristescu this am  Domenic Polite, MD

## 2019-02-16 NOTE — Plan of Care (Signed)

## 2019-02-16 NOTE — Progress Notes (Signed)
ANTICOAGULATION CONSULT NOTE - Initial Consult  Pharmacy Consult for Eliquis Indication: atrial fibrillation  No Known Allergies  Patient Measurements: Height: 5\' 11"  (180.3 cm) Weight: 236 lb 12.4 oz (107.4 kg) IBW/kg (Calculated) : 75.3   Vital Signs: Temp: 97.4 F (36.3 C) (11/15 1152) Temp Source: Oral (11/15 1152) BP: 103/72 (11/15 1152) Pulse Rate: 98 (11/15 1152)  Labs: Recent Labs    02/15/19 1844 02/15/19 2100  HGB 15.6  --   HCT 43.4  --   PLT 262  --   CREATININE 1.27*  --   TROPONINIHS 10 11    Estimated Creatinine Clearance: 84.8 mL/min (A) (by C-G formula based on SCr of 1.27 mg/dL (H)).   Medical History: Past Medical History:  Diagnosis Date  . Aortic atherosclerosis ([place])    on CXR  . Blood in stool   . C. difficile colitis    a. remote hx 2010.  . Cardiac arrest - ventricular fibrillation 02/11/2009   a. 02/2009 s/p St Jude ICD.  [name] Chronic systolic CHF (congestive heart failure) ([place])   . Coronary artery disease    a. PCI to Cx age 89. b. CABGx4 in 2003. c. BMS to [place] in 12/2007.  [name] Former tobacco use   . Hyperlipidemia   . Hypertension   . Ischemic cardiomyopathy   . Marijuana abuse   . Myocardial infarct ([name])    NON-ST-SEGMENT ELEVATION MI  . Obesity   . PAD (peripheral artery disease) ([place])    a. RLE by noninvasive testing - managed medically for now.  . Ventricular tachyarrhythmia ([place])   . Ventricular tachycardia ([name])     Medications:  Scheduled:  . alum & mag hydroxide-simeth  30 mL Oral Once   And  . lidocaine  15 mL Oral Once  . apixaban  5 mg Oral BID  . atorvastatin  80 mg Oral QHS  . carvedilol  25 mg Oral BID  . clopidogrel  300 mg Oral Once  . [START ON 02/17/2019] clopidogrel  75 mg Oral Daily  . diltiazem  10 mg Intravenous Once  . ezetimibe  10 mg Oral QHS  . furosemide  20 mg Oral Daily  . [START ON 02/17/2019] isosorbide mononitrate  60 mg Oral Daily  . lisinopril  20 mg Oral Daily  . montelukast  10 mg  Oral Daily  . multivitamin with minerals  1 tablet Oral Daily  . pantoprazole  40 mg Oral Daily  . potassium chloride SA  20 mEq Oral QHS    Assessment: 53 yo male with new onset afib. Pharmacy asked to begin Eliquis. Wt = 107.4 kg, age < 43, Scr 1.27  Goal of Therapy:  Monitor platelets by anticoagulation protocol: Yes   Plan:  Eliquis 5 mg BID Will provide patient education prior to discharge.  Marguerite Olea, [facility] Clinical Pharmacist Phone [phone]  02/16/2019 2:36 PM

## 2019-02-16 NOTE — H&P (Signed)
History and Physical    Billy [name] [id number] DOB: Dec 22, 1965 DOA: 02/15/2019  PCP: Patient, No Pcp Per    Patient coming from: Home    Chief Complaint: Chest pain atypical  HPI: Billy [name] is a 53 y.o. male with medical history significant of  Coronary artery disease, status post CABG, prior VT and V. fib cardiac arrest 02/2009 status post ICD ischemic cardiomyopathy CHF reduced ejection fraction hypertension hypercholesterolemia, PAD, came with a chief complaint of chest pain chest discomfort only when he lies down.  Patient had a balloon angioplasty of circumflex artery on 01/22/2019.  Via left radial artery.  Patient states since the procedure he has this chest pain which is very uncomfortable.  He is afraid he may have another ischemic event.  He denies any shortness of breath fever chills cough.  ED Course:  Stable hemodynamically Chest pain-free Electrocardiogram no acute ST-T changes Troponin negative  Review of Systems: As per HPI otherwise 10 point review of systems negative.  Except chest pain 1 when he lies down  Past Medical History:  Diagnosis Date   Aortic atherosclerosis ([facility])    on CXR   Blood in stool    C. difficile colitis    a. remote hx 2010.   Cardiac arrest - ventricular fibrillation 02/11/2009   a. 02/2009 s/p [employer] Jude ICD.   Chronic systolic CHF (congestive heart failure) ([name])    Coronary artery disease    a. PCI to Cx age 64. b. CABGx4 in 2003. c. BMS to [place] in 12/2007.   Former tobacco use    Hyperlipidemia    Hypertension    Ischemic cardiomyopathy    Marijuana abuse    Myocardial infarct ([place])    NON-ST-SEGMENT ELEVATION MI   Obesity    PAD (peripheral artery disease) ([place])    a. RLE by noninvasive testing - managed medically for now.   Ventricular tachyarrhythmia ([name])    Ventricular tachycardia ([place])     Past Surgical History:  Procedure Laterality Date   CARDIAC DEFIBRILLATOR PLACEMENT     STJ single  chamber ICD implanted for secondary prevention   CIRCUMCISION     CORONARY ARTERY BYPASS GRAFT  06/17/01   X 4   CORONARY BALLOON ANGIOPLASTY N/A 01/22/2019   Procedure: CORONARY BALLOON ANGIOPLASTY;  Surgeon: Leonie Man, MD;  Location: [facility];  Service: Cardiovascular;  Laterality: N/A;   LEFT HEART CATH AND CORS/GRAFTS ANGIOGRAPHY N/A 01/22/2019   Procedure: LEFT HEART CATH AND CORS/GRAFTS ANGIOGRAPHY;  Surgeon: Leonie Man, MD;  Location: [facility];  Service: Cardiovascular;  Laterality: N/A;     reports that he quit smoking about 15 years ago. He has a 40.00 pack-year smoking history. He has never used smokeless tobacco. He reports that he does not drink alcohol or use drugs.  No Known Allergies  Family History  Problem Relation Age of Onset   Hypertension Mother    Heart attack Father        DIED AT 28 FROM MI   Heart disease Sister        CAD AND PREVIOUS CABG   Hypertension Other        IN MOST OF HIS SIBLINGS     Prior to Admission medications   Medication Sig Start Date End Date Taking? Authorizing Provider  aspirin 81 MG EC tablet Take 81 mg by mouth every morning.    Yes [provider]  atorvastatin (LIPITOR) 80 MG tablet TAKE 1 TABLET  BY MOUTH EVERY DAY Patient taking differently: Take 80 mg by mouth at bedtime.  02/10/19  Yes Lelon Perla, MD  carvedilol (COREG) 25 MG tablet Take 1 tablet (25 mg total) by mouth 2 (two) times daily. 01/21/19  Yes Lelon Perla, MD  dicyclomine (BENTYL) 20 MG tablet TAKE 1 TABLET BY MOUTH TWICE A DAY Patient taking differently: Take 20 mg by mouth 2 (two) times daily as needed for spasms.  04/01/18  Yes Charlott Rakes, MD  ezetimibe (ZETIA) 10 MG tablet Take 1 tablet (10 mg total) by mouth daily. Patient taking differently: Take 10 mg by mouth at bedtime.  01/22/19  Yes Lelon Perla, MD  furosemide (LASIX) 20 MG tablet TAKE 1 TABLET BY MOUTH EVERY DAY Patient taking  differently: Take 20 mg by mouth daily.  11/29/18  Yes Lelon Perla, MD  isosorbide mononitrate (IMDUR) 30 MG 24 hr tablet Take 1 tablet (30 mg total) by mouth daily. 09/30/18 03/29/19 Yes Lelon Perla, MD  lisinopril-hydrochlorothiazide (ZESTORETIC) 20-25 MG tablet TAKE 1 TABLET BY MOUTH EVERY DAY Patient taking differently: Take 1 tablet by mouth daily.  02/10/19  Yes Lelon Perla, MD  montelukast (SINGULAIR) 10 MG tablet Take 1 tablet (10 mg total) by mouth at bedtime. Patient taking differently: Take 10 mg by mouth daily.  08/23/17  Yes Charlott Rakes, MD  Multiple Vitamin (MULTIVITAMIN WITH MINERALS) TABS tablet Take 1 tablet by mouth daily.   Yes [provider]  nitroGLYCERIN (NITROSTAT) 0.4 MG SL tablet Place 1 tablet (0.4 mg total) under the tongue every 5 (five) minutes as needed for chest pain. 05/31/18  Yes Lelon Perla, MD  omeprazole (PRILOSEC) 40 MG capsule Take 1 capsule (40 mg total) by mouth daily. Patient taking differently: Take 40 mg by mouth daily as needed (acid reflux/indigestion).  06/03/18  Yes Lelon Perla, MD  ondansetron (ZOFRAN) 4 MG tablet Take 1 tablet (4 mg total) by mouth every 8 (eight) hours as needed for nausea or vomiting. 08/23/17  Yes Newlin, Enobong, MD  potassium chloride SA (KLOR-CON M20) 20 MEQ tablet Take 1 tablet (20 mEq total) by mouth daily. Patient taking differently: Take 20 mEq by mouth at bedtime.  05/31/18  Yes Lelon Perla, MD  ticagrelor (BRILINTA) 90 MG TABS tablet Take 1 tablet (90 mg total) by mouth 2 (two) times daily. 01/23/19  Yes Mosetta Anis, MD    Physical Exam: Vitals:   02/15/19 2130 02/15/19 2300 02/16/19 0000 02/16/19 0036  BP: 125/82 119/85 110/78 126/88  Pulse: 62 (!) 56 (!) 56   Resp: 12 16 18 18   Temp:    97.7 F (36.5 C)  TempSrc:    Oral  SpO2: 100% 98% 98%   Weight:    107.4 kg  Height:    5\' 11"  (1.803 m)    Constitutional: NAD, calm, comfortable Vitals:   02/15/19 2130 02/15/19  2300 02/16/19 0000 02/16/19 0036  BP: 125/82 119/85 110/78 126/88  Pulse: 62 (!) 56 (!) 56   Resp: 12 16 18 18   Temp:    97.7 F (36.5 C)  TempSrc:    Oral  SpO2: 100% 98% 98%   Weight:    107.4 kg  Height:    5\' 11"  (1.803 m)   Eyes: PERRL, lids and conjunctivae normal ENMT: Mucous membranes are moist. Posterior pharynx clear of any exudate or lesions.Normal dentition.  Neck: normal, supple, no masses, no thyromegaly Respiratory: clear to auscultation bilaterally, no  wheezing, no crackles. Normal respiratory effort. No accessory muscle use.  Cardiovascular: Regular rate and rhythm, no murmurs / rubs / gallops. No extremity edema. 2+ pedal pulses. No carotid bruits.  Abdomen: no tenderness, no masses palpated. No hepatosplenomegaly. Bowel sounds positive.  Musculoskeletal: no clubbing / cyanosis. No joint deformity upper and lower extremities. Good ROM, no contractures. Normal muscle tone.  Skin: no rashes, lesions, ulcers. No induration Neurologic: CN 2-12 grossly intact. Sensation intact, DTR normal. Strength 5/5 in all 4.  Psychiatric: Normal judgment and insight. Alert and oriented x 3. Normal mood.    Labs on Admission: I have personally reviewed following labs and imaging studies  CBC: Recent Labs  Lab 02/15/19 1844  WBC 13.0*  HGB 15.6  HCT 43.4  MCV 82.2  PLT 99991111   Basic Metabolic Panel: Recent Labs  Lab 02/15/19 1844  NA 136  K 3.9  CL 100  CO2 25  GLUCOSE 106*  BUN 17  CREATININE 1.27*  CALCIUM 9.5  MG 1.9   GFR: Estimated Creatinine Clearance: 84.8 mL/min (A) (by C-G formula based on SCr of 1.27 mg/dL (H)). Liver Function Tests: Recent Labs  Lab 02/15/19 1844  AST 29  ALT 43  ALKPHOS 78  BILITOT 0.7  PROT 7.1  ALBUMIN 3.8   No results for input(s): LIPASE, AMYLASE in the last 168 hours. No results for input(s): AMMONIA in the last 168 hours. Coagulation Profile: No results for input(s): INR, PROTIME in the last 168 hours. Cardiac  Enzymes: No results for input(s): CKTOTAL, CKMB, CKMBINDEX, TROPONINI in the last 168 hours. BNP (last 3 results) No results for input(s): PROBNP in the last 8760 hours. HbA1C: No results for input(s): HGBA1C in the last 72 hours. CBG: No results for input(s): GLUCAP in the last 168 hours. Lipid Profile: No results for input(s): CHOL, HDL, LDLCALC, TRIG, CHOLHDL, LDLDIRECT in the last 72 hours. Thyroid Function Tests: No results for input(s): TSH, T4TOTAL, FREET4, T3FREE, THYROIDAB in the last 72 hours. Anemia Panel: No results for input(s): VITAMINB12, FOLATE, FERRITIN, TIBC, IRON, RETICCTPCT in the last 72 hours. Urine analysis:    Component Value Date/Time   COLORURINE YELLOW 10/20/2015 1148   APPEARANCEUR CLEAR 10/20/2015 1148   LABSPEC 1.011 10/20/2015 1148   PHURINE 8.5 (H) 10/20/2015 1148   GLUCOSEU NEGATIVE 10/20/2015 1148   HGBUR SMALL (A) 10/20/2015 1148   BILIRUBINUR negative 06/06/2016 1012   KETONESUR NEGATIVE 10/20/2015 1148   PROTEINUR negative 06/06/2016 1012   PROTEINUR 30 (A) 10/20/2015 1148   UROBILINOGEN negative 06/06/2016 1012   UROBILINOGEN 1.0 05/30/2012 1931   NITRITE negative 06/06/2016 1012   NITRITE NEGATIVE 10/20/2015 1148   LEUKOCYTESUR large (3+) (A) 06/06/2016 1012    Radiological Exams on Admission: Dg Chest 2 View  Result Date: 02/15/2019 CLINICAL DATA:  Chest pain and shortness of breath for 3 weeks. EXAM: CHEST - 2 VIEW COMPARISON:  01/22/2019 FINDINGS: AICD noted. Prior CABG. Borderline enlargement of the cardiopericardial silhouette, without edema. The lungs appear clear. No blunting of the costophrenic angles. IMPRESSION: Borderline enlargement of the cardiopericardial silhouette, without edema. Otherwise, no significant abnormalities are observed. Electronically Signed   By: Van Clines M.D.   On: 02/15/2019 19:19   Dg Chest Portable 1 View  Result Date: 02/15/2019 CLINICAL DATA:  Pulmonary edema.  Chest pain, shortness of  breath EXAM: PORTABLE CHEST 1 VIEW COMPARISON:  02/15/2019 FINDINGS: Left AICD remains in place, unchanged. Mild cardiomegaly. Prior CABG. No edema or effusions. No acute bony abnormality. IMPRESSION:  Mild cardiomegaly.  No active disease. Electronically Signed   By: Rolm Baptise M.D.   On: 02/15/2019 19:50    EKG: Independently reviewed sinus no acute ST-T changes.   Assessment/Plan Principal Problem:   CHEST PAIN Active Problems:   Hyperlipidemia   Essential hypertension   Cardiomyopathy, ischemic   PERIPHERAL VASCULAR DISEASE   Implantable cardioverter-defibrillator (ICD) in situ   GERD (gastroesophageal reflux disease)   S/P quadruple vessel bypass   Status post angioplasty   Chest pain   Atypical chest pain Pain discomfort when patient lies down Patient states has this type of pain after recent angioplasty via left radial artery, balloon angioplasty to circumflex artery This chest pain is different from that he had it before angioplasty chest pain radiating to the left arm Electrocardiogram normal sinus no acute ST-T changes  Troponi troponin negative Patient concerned about new ischemic event Keep him observation to see cardiologist in the morning Try to reach someone in the office not able to  Coronary artery disease status post CABG x4/ischemic cardiomyopathy Isosorbide, Brilinta, Lipitor aspirin  Congestive heart failure reduced ejection fraction/ischemic cardiomyopathy History of V. fib cardiac arrest status post ICD Coreg , diuretics Lasix, nitro  Hypertension Hydrochlorothiazide/lisinopril  DVT prophylaxis: Lovenox Code Status: Full code Family Communication: Wife in the room Disposition Plan: Home Consults called:  to be called in the morning his cardiologist Admission status: Observation   Garner Dullea G Bernardine Langworthy MD [facility]  If 7PM-7AM, please contact night-coverage [email or web address]   02/16/2019, 4:05 AM

## 2019-02-16 NOTE — Progress Notes (Addendum)
Patient seen and examined, admitted earlier this morning by Dr.Cristescu,  Briefly Billy Simmonsis a 53 y.o.malewith medical history significant of Coronary artery disease, status post CABG, prior VT and V. fib cardiac arrest 02/2009 status post ICD ischemic cardiomyopathy CHF, EF of 40%,  hypertension hypercholesterolemia, PAD, came with a chief complaint of chest discomfort primarily when he lays down, ongoing foot intermittently for the last couple of weeks. He has extensive CAD with multiple known occlusion of his grafts, he had a balloon angioplasty of circumflex artery on 01/22/2019, reports feeling well few days after the procedure however about a week later started having chest discomfort.  Atypical chest pain -EKG is unremarkable, has mild T wave inversion in inferior leads which is unchanged from prior, high-sensitivity troponin is negative x2 -Clinically no evidence of ACS at this time -Known CAD, see discussion below increase Imdur dose  New atrial fibrillation -Just went into atrial fib this morning,  -Last echo 3 weeks ago 10/22 with improved EF to 50/55% -On very high doses of Coreg already, CCB may be an option with improvement in EF, will defer to cardiology, also anticoagulation will be challenging given dual antiplatelet therapy -Cardiology consult requested  CAD/CABG/ischemic cardiomyopathy -As discussed above, remains on aspirin, Brilinta, Coreg, statin -Cath 3 weeks ago, occlusion of SVG to RCA and SVG to OM 2, balloon angioplasty of left circumflex stenosis 10/21 -Increase Imdur dose  CKD stage II Stable  Rest of his medical problems are stable as noted by Dr.Cristescu this am  Domenic Polite, MD

## 2019-02-16 NOTE — Consult Note (Addendum)
Admit date: 02/15/2019 Referring Physician  Dr. Domenic Polite Primary Physician  None Primary Cardiologist  Kirk Ruths, MD Reason for Consultation  New onset atrial fibrillation  HPI: Billy [name] is a 53 y.o. male who is being seen today for the evaluation of new onset atrial fibrillation at the request of Domenic Polite, MD.  This is a 53 yo male with a hx of vfib cardiac arrest in 2010 s/p St Jude AICD, chronic systolic CHF, ASCAD s/p PCI at age 63 at 73 of the [place] and then CABG in 2003.  In 12/2007 he developed VT during a treadmill stress test so underwent cardiac cath showing severe 3-vessel CAD with 2/4 grafts patent with mildly reduced EF. He underwent BMS to the OM1.   Outpatient EP evaluation was planned. Cardiac MRI in 03/2008 showed moderate left ventricular cavity enlargement, posterior lateral akinesis, two thirds thickness scar involving the posterior lateral wall had mid and basal level, the total amount of scar burden in the ventricle is low, EF 35%, significant dyssynergia between the septum and lateral wall, left atrial enlargement.   In 02/2009 he suffered an out-of-hospital VF arrest. Emergent cath showed no culprit, 2/4 patent bypass grafts, patent OM stent, known occlusions of VG-RCA and VG-OM). EF was down to 10-15%. He underwent St. Jude ICD implantation. Follow-up echo that month showed EF of 40-45% with akinesis of the basal-mid inferoposterior myocardium and mild mitral regurgitation. He has been followed with periodic nuclear stress tests, last in 08/2017 showed LVEF 30%, findings c/w prior MI with peri-infarct ischemia, large defect of severe severity present in the basal inferior, basal inferolateral, mid inferior and mid inferolateral location, no new reversible ischemia seen compared to prior studies. Medical therapy was recommended, observing for recurrent angina. His device is followed by Dr. Caryl Comes with last device check 11/2018 unremarkable. He did not  tolerate Entresto as he just felt generally poorly with this so has been on lisinopril in combination with HCTZ along with meds outlined below.  He has also been seen by Dr. Fletcher Anon for PVD with claudication and ABI of 0.63 in the RLE, managed medically given that he was not having lifestyle limiting symptoms.   He was recently admitted 01/22/2019 with CP and underwent cath showing severe native vessel Dz with chronically occluded LCx/OM2, known occluded SVG to RCA and occluded SVG to OM2. This cath showed new atretic LIMA to LAD with competitive flow due to minimal dz in the LAD.  SVG to D1 patent with 40-4% ostial stenosis and ostial LCx with 80% stenosis and underwent scoring balloon PTCA on the distal LM/ostial LCx down to 40%.  EF at that time was 40-45%.   He was discharged hom on DAPT with ASA and Brilinta. He also has HTN, HLD, Ischemic DCM, PAD s/p left SFA stent remotely and ventricular tachycardia. Repeat 2D echo last month showed EF 50-55%.  He presented to the ER 11/14 with complaints of atypical CP that occurs when laying down but different from his typical CP he had before his PTCA in that it was not severe.  He also tells me that he has "spells" shortly after taking his Brilinta in that he gets acutely diaphoretic and very weak and then has SOB and chest tightness.  This has been occurring ever since starting Brilinta.  He says his symptoms will resolve and then about 30 min to an hour after taking his evening dose of Brilinta the same sx occur.  EKG showed no acute ST  changes and trop neg.  This am he shortly after getting his Brilinta he became severely SOB, diaphoretic and weak sitting in the chair and was noted on tele to go into afib with RVR and then converted back to NSR spontaneously.  Currently he feels fine.  Cardiology is now asked to consult for help with afib/CP management.   PMH:   Past Medical History:  Diagnosis Date   Aortic atherosclerosis ([place])    on CXR   Blood in  stool    C. difficile colitis    a. remote hx 2010.   Cardiac arrest - ventricular fibrillation 02/11/2009   a. 02/2009 s/p St Jude ICD.   Chronic systolic CHF (congestive heart failure) ([name])    Coronary artery disease    a. PCI to Cx age 2. b. CABGx4 in 2003. c. BMS to [place] in 12/2007.   Former tobacco use    Hyperlipidemia    Hypertension    Ischemic cardiomyopathy    Marijuana abuse    Myocardial infarct ([place])    NON-ST-SEGMENT ELEVATION MI   Obesity    PAD (peripheral artery disease) ([place])    a. RLE by noninvasive testing - managed medically for now.   Ventricular tachyarrhythmia ([name])    Ventricular tachycardia ([name])      PSH:   Past Surgical History:  Procedure Laterality Date   CARDIAC DEFIBRILLATOR PLACEMENT     STJ single chamber ICD implanted for secondary prevention   CIRCUMCISION     CORONARY ARTERY BYPASS GRAFT  06/17/01   X 4   CORONARY BALLOON ANGIOPLASTY N/A 01/22/2019   Procedure: CORONARY BALLOON ANGIOPLASTY;  Surgeon: Leonie Man, MD;  Location: [facility];  Service: Cardiovascular;  Laterality: N/A;   LEFT HEART CATH AND CORS/GRAFTS ANGIOGRAPHY N/A 01/22/2019   Procedure: LEFT HEART CATH AND CORS/GRAFTS ANGIOGRAPHY;  Surgeon: Leonie Man, MD;  Location: [facility];  Service: Cardiovascular;  Laterality: N/A;    Allergies:  Patient has no known allergies. Prior to Admit Meds:   Medications Prior to Admission  Medication Sig Dispense Refill Last Dose   aspirin 81 MG EC tablet Take 81 mg by mouth every morning.    02/15/2019 at am   atorvastatin (LIPITOR) 80 MG tablet TAKE 1 TABLET BY MOUTH EVERY DAY (Patient taking differently: Take 80 mg by mouth at bedtime. ) 90 tablet 3 02/14/2019 at pm   carvedilol (COREG) 25 MG tablet Take 1 tablet (25 mg total) by mouth 2 (two) times daily. 180 tablet 3 02/15/2019 at 600   dicyclomine (BENTYL) 20 MG tablet TAKE 1 TABLET BY MOUTH TWICE A DAY (Patient taking differently:  Take 20 mg by mouth 2 (two) times daily as needed for spasms. ) 180 tablet 1 2 weeks ago   ezetimibe (ZETIA) 10 MG tablet Take 1 tablet (10 mg total) by mouth daily. (Patient taking differently: Take 10 mg by mouth at bedtime. ) 90 tablet 0 02/14/2019 at pm   furosemide (LASIX) 20 MG tablet TAKE 1 TABLET BY MOUTH EVERY DAY (Patient taking differently: Take 20 mg by mouth daily. ) 90 tablet 0 02/15/2019 at am   lisinopril-hydrochlorothiazide (ZESTORETIC) 20-25 MG tablet TAKE 1 TABLET BY MOUTH EVERY DAY (Patient taking differently: Take 1 tablet by mouth daily. ) 90 tablet 3 02/15/2019 at am   montelukast (SINGULAIR) 10 MG tablet Take 1 tablet (10 mg total) by mouth at bedtime. (Patient taking differently: Take 10 mg by mouth daily. ) 90 tablet  1 02/15/2019 at am   Multiple Vitamin (MULTIVITAMIN WITH MINERALS) TABS tablet Take 1 tablet by mouth daily.   02/15/2019 at am   nitroGLYCERIN (NITROSTAT) 0.4 MG SL tablet Place 1 tablet (0.4 mg total) under the tongue every 5 (five) minutes as needed for chest pain. 25 tablet 3 02/14/2019 at night   omeprazole (PRILOSEC) 40 MG capsule Take 1 capsule (40 mg total) by mouth daily. (Patient taking differently: Take 40 mg by mouth daily as needed (acid reflux/indigestion). ) 90 capsule 3 couple days ago   ondansetron (ZOFRAN) 4 MG tablet Take 1 tablet (4 mg total) by mouth every 8 (eight) hours as needed for nausea or vomiting. 30 tablet 1 02/14/2019 at Unknown time   potassium chloride SA (KLOR-CON M20) 20 MEQ tablet Take 1 tablet (20 mEq total) by mouth daily. (Patient taking differently: Take 20 mEq by mouth at bedtime. ) 90 tablet 1 02/14/2019 at pm   ticagrelor (BRILINTA) 90 MG TABS tablet Take 1 tablet (90 mg total) by mouth 2 (two) times daily. 60 tablet 0 02/15/2019 at 1700   [DISCONTINUED] isosorbide mononitrate (IMDUR) 30 MG 24 hr tablet Take 1 tablet (30 mg total) by mouth daily. 90 tablet 0 02/15/2019 at am   Fam HX:    Family History    Problem Relation Age of Onset   Hypertension Mother    Heart attack Father        DIED AT 28 FROM MI   Heart disease Sister        CAD AND PREVIOUS CABG   Hypertension Other        IN MOST OF HIS SIBLINGS   Social HX:    Social History   Socioeconomic History   Marital status: Married    Spouse name: Not on file   Number of children: 3   Years of education: 11   Highest education level: Not on file  Occupational History   Occupation: Disability  Social Designer, fashion/clothing strain: Not on file   Food insecurity    Worry: Not on file    Inability: Not on file   Transportation needs    Medical: Not on file    Non-medical: Not on file  Tobacco Use   Smoking status: Former Smoker    Packs/day: 2.00    Years: 20.00    Pack years: 40.00    Quit date: 04/04/2003    Years since quitting: 15.8   Smokeless tobacco: Never Used  Substance and Sexual Activity   Alcohol use: No   Drug use: No   Sexual activity: Yes    Partners: Female  Lifestyle   Physical activity    Days per week: 7 days    Minutes per session: 30 min   Stress: Not at all  Relationships   Social connections    Talks on phone: More than three times a week    Gets together: More than three times a week    Attends religious service: Patient refused    Active member of club or organization: Patient refused    Attends meetings of clubs or organizations: Patient refused    Relationship status: Married   Intimate partner violence    Fear of current or ex partner: No    Emotionally abused: No    Physically abused: No    Forced sexual activity: No  Other Topics Concern   Not on file  Social History Narrative   MARRIED   FORMER TOBACCO USE,  SMOKED FOR 20 YRS 2 PPD; QUIT IN 2005   NO ETOH   NO ILLICIT DRUG USE   NO REGULAR EXERCISE         ICD-[employer] ; CERTIFIED LETTER SENT AND RETURNED BAD ADDRESS, PLS UPDATE DJW.      Fun: Fishing      ROS:  All  ROS were addressed and  are negative except what is stated in the HPI  Physical Exam: Blood pressure 119/88, pulse 78, temperature 98.1 F (36.7 C), temperature source Oral, resp. rate (!) 22, height 5\' 11"  (1.803 m), weight 107.4 kg, SpO2 95 %.    General: Well developed, well nourished, in no acute distress Head: Eyes PERRLA, No xanthomas.   Normal cephalic and atramatic  Lungs:   Clear bilaterally to auscultation and percussion. Heart:   HRRR S1 S2 Pulses are 2+ & equal.            No carotid bruit. No JVD.  No abdominal bruits. No femoral bruits. Abdomen: Bowel sounds are positive, abdomen soft and non-tender without masses or                  Hernia's noted. Msk:  Back normal, normal gait. Normal strength and tone for age. Extremities:   No clubbing, cyanosis or edema.  DP +1 Neuro: Alert and oriented X 3. Psych:  Good affect, responds appropriately    Labs:   Lab Results  Component Value Date   WBC 13.0 (H) 02/15/2019   HGB 15.6 02/15/2019   HCT 43.4 02/15/2019   MCV 82.2 02/15/2019   PLT 262 02/15/2019    Recent Labs  Lab 02/15/19 1844  NA 136  K 3.9  CL 100  CO2 25  BUN 17  CREATININE 1.27*  CALCIUM 9.5  PROT 7.1  BILITOT 0.7  ALKPHOS 78  ALT 43  AST 29  GLUCOSE 106*   No results found for: PTT Lab Results  Component Value Date   INR 1.27 02/20/2009   INR 1.18 02/03/2009   INR 1.59 (H) 02/03/2009   Lab Results  Component Value Date   CKTOTAL 6649 RESULTS CONFIRMED BY MANUAL DILUTION (H) 02/04/2009   CKMB 555.6 RESULTS CONFIRMED BY MANUAL DILUTION (H) 02/04/2009   TROPONINI <0.30 09/13/2012     Lab Results  Component Value Date   CHOL 112 01/23/2019   CHOL 126 01/17/2019   CHOL 142 10/18/2017   Lab Results  Component Value Date   HDL 29 (L) 01/23/2019   HDL 45 01/17/2019   HDL 46 10/18/2017   Lab Results  Component Value Date   LDLCALC 69 01/23/2019   LDLCALC 68 01/17/2019   LDLCALC 84 10/18/2017   Lab Results  Component Value Date   TRIG 68 01/23/2019     TRIG 60 01/17/2019   TRIG 58 10/18/2017   Lab Results  Component Value Date   CHOLHDL 3.9 01/23/2019   CHOLHDL 2.8 01/17/2019   CHOLHDL 3.1 10/18/2017   No results found for: LDLDIRECT    Radiology:  Dg Chest 2 View  Result Date: 02/15/2019 CLINICAL DATA:  Chest pain and shortness of breath for 3 weeks. EXAM: CHEST - 2 VIEW COMPARISON:  01/22/2019 FINDINGS: AICD noted. Prior CABG. Borderline enlargement of the cardiopericardial silhouette, without edema. The lungs appear clear. No blunting of the costophrenic angles. IMPRESSION: Borderline enlargement of the cardiopericardial silhouette, without edema. Otherwise, no significant abnormalities are observed. Electronically Signed   By: Van Clines M.D.   On: 02/15/2019  19:19   Dg Chest Portable 1 View  Result Date: 02/15/2019 CLINICAL DATA:  Pulmonary edema.  Chest pain, shortness of breath EXAM: PORTABLE CHEST 1 VIEW COMPARISON:  02/15/2019 FINDINGS: Left AICD remains in place, unchanged. Mild cardiomegaly. Prior CABG. No edema or effusions. No acute bony abnormality. IMPRESSION: Mild cardiomegaly.  No active disease. Electronically Signed   By: Rolm Baptise M.D.   On: 02/15/2019 19:50     Telemetry    Atrial fibrillation with RVR and now NSR - Personally Reviewed  ECG    Atrial fibrillation with inferior infarct and no ST changes - Personally Reviewed   ASSESSMENT/PLAN:   1.  New onset atrial fibrillation -he had sudden onset of afib with RVR this am but converted back to NSR spontaneously -he has been having spells of sudden onset of diaphoresis, weakness, SOB and CP shortly after he takes his Brilinta and then sx resolve until he takes his next dose of Brilinta.  This has occurred daily ever since starting Brilinta.  Unclear is he is having some type of reaction to adenosine blockade resulting in vagal reaction and bradycardia induced afib or if sx may be due to PAF with RVR but these episodes seem to correlate with when  he takes his Brilinta. -he is currently on Carvedilol 25mg  BID -avoid CCB with hx of low EF in the past -he is almost 4 weeks out from PTCA of LM/ostial LCx -will stop ASA since starting Eliquis 5mg  BID (CHADS2VASC score 4). -TSH normal  2.  ASCAD -s/p remote PCI of the LCx then CABG for severe 3v CAD followed by BMS to the OM1 in 2009. -most recent cath last month showed severe native vessel Dz with chronically occluded LCx/OM2, known occluded SVG to RCA and occluded SVG to OM2. This cath showed new atretic LIMA to LAD with competitive flow due to minimal dz in the LAD.  SVG to D1 patent with 40-4% ostial stenosis and ostial LCx with 80% stenosis . -s/p scoring balloon PTCA on the distal LM/ostial LCx down to 40%. -currently on DAPT with ASA and Brinlinta -now with episodes of chest pain (somewhat different from prior angina), SOB, diaphoresis and weakness that come on shortly after taking his Brilinta in the am and then resolves only to occur again in the evening when he takes it again -discussed case with Interventionalist, Dr. Irish Lack, and will stop Brilinta and start Plavix -will be bolused with Plavix 300mg  tonight and then start 75mg  daily tomorrow. -will stop ASA as we are adding DOAC -continue statin, long acting nitrate and BB  3.  Ischemic DCM -EF initially 15% in setting of VT -s/p AICD -EF improved to 50% on last echo a month ago -continue carvedilol and ACE I -does not appear volume overloaded  4.  CKD stage 2 -creatinine stable  4.  HTN -BP controlled -continue Carvedilol 25mg  BID and Lisinopril 20mg  daily  5.  HLD -LDL goal < 70 -LDL 69 last month -continue atorvastatin 80mg  daily and Zetia 10mg  daily  Fransico Him, MD  02/16/2019  11:52 AM

## 2019-02-16 NOTE — Discharge Instructions (Signed)

## 2019-02-16 NOTE — Progress Notes (Signed)
Pt c/o chest heaviness at 1016 and administrated NTG SL then no pain right away. Cardiac rhythm changed from NSR to A-fib at 0955. His HR went up to 150's then down to 90's 110's for three episodes, but HR stays 100's -120's. Then at times up to 130's - 140's. Dr. Broadus John notified this matter but hold Cardizem IVP until HR stay 120's -140's. Now converted himself NSR at 1210. HS [employer]

## 2019-02-17 ENCOUNTER — Ambulatory Visit (HOSPITAL_COMMUNITY): Payer: Medicare Other

## 2019-02-17 DIAGNOSIS — I4891 Unspecified atrial fibrillation: Secondary | ICD-10-CM | POA: Diagnosis not present

## 2019-02-17 DIAGNOSIS — Z7901 Long term (current) use of anticoagulants: Secondary | ICD-10-CM

## 2019-02-17 DIAGNOSIS — I255 Ischemic cardiomyopathy: Secondary | ICD-10-CM | POA: Diagnosis not present

## 2019-02-17 DIAGNOSIS — R079 Chest pain, unspecified: Secondary | ICD-10-CM | POA: Diagnosis not present

## 2019-02-17 DIAGNOSIS — I25118 Atherosclerotic heart disease of native coronary artery with other forms of angina pectoris: Secondary | ICD-10-CM | POA: Diagnosis not present

## 2019-02-17 LAB — CBC
HCT: 43.9 % (ref 39.0–52.0)
Hemoglobin: 15.4 g/dL (ref 13.0–17.0)
MCH: 28.8 pg (ref 26.0–34.0)
MCHC: 35.1 g/dL (ref 30.0–36.0)
MCV: 82.1 fL (ref 80.0–100.0)
Platelets: 247 10*3/uL (ref 150–400)
RBC: 5.35 MIL/uL (ref 4.22–5.81)
RDW: 14.1 % (ref 11.5–15.5)
WBC: 10.2 10*3/uL (ref 4.0–10.5)
nRBC: 0 % (ref 0.0–0.2)

## 2019-02-17 LAB — BASIC METABOLIC PANEL
Anion gap: 10 (ref 5–15)
BUN: 15 mg/dL (ref 6–20)
CO2: 24 mmol/L (ref 22–32)
Calcium: 9.4 mg/dL (ref 8.9–10.3)
Chloride: 100 mmol/L (ref 98–111)
Creatinine, Ser: 1.12 mg/dL (ref 0.61–1.24)
GFR calc Af Amer: >60 mL/min (ref >60)
GFR calc non Af Amer: >60 mL/min (ref >60)
Glucose, Bld: 140 mg/dL — ABNORMAL HIGH (ref 70–99)
Potassium: 3.8 mmol/L (ref 3.5–5.1)
Sodium: 134 mmol/L — ABNORMAL LOW (ref 135–145)

## 2019-02-17 MED ORDER — APIXABAN 5 MG PO TABS: 5.0000 mg | ORAL_TABLET | Freq: Two times a day (BID) | ORAL | 0 refills | Status: DC

## 2019-02-17 NOTE — Discharge Summary (Signed)
Physician Discharge Summary  Dillinger Ruvalcaba [id number] DOB: [birth year] DOA: 02/15/2019  PCP: Patient, No Pcp Per  Admit date: 02/15/2019 Discharge date: 02/17/2019  Time spent: 35 minutes  Recommendations for Outpatient Follow-up:  PCP in 1 week Cardiology Dr. Stanford Breed in 2 weeks, newly started on Eliquis for new onset A. fib and Brilinta changed to Plavix  Discharge Diagnoses:  Principal Problem:   CHEST PAIN Active Problems:   Hyperlipidemia   Essential hypertension   Cardiomyopathy, ischemic   PERIPHERAL VASCULAR DISEASE   Implantable cardioverter-defibrillator (ICD) in situ   GERD (gastroesophageal reflux disease)   S/P quadruple vessel bypass   Status post angioplasty   Chest pain   New onset atrial fibrillation [facility])   Discharge Condition: Stable  Diet recommendation: Heart healthy  Filed Weights   02/15/19 1839 02/16/19 0036  Weight: 106.6 kg 107.4 kg    History of present illness:  Alante Simmonsis a 53 y.o.malewith medical history significant of Coronary artery disease, status post CABG, prior VT and V. fib cardiac arrest 02/2009 status post ICD ischemic cardiomyopathy CHF,EF of 40%,hypertension hypercholesterolemia, PAD, came with a chief complaint of chest discomfort primarily when he lays down, ongoing foot intermittently for the last couple of weeks. He has extensive CAD with multiple known occlusion of his grafts,hehad a balloon angioplasty of circumflex artery on 01/22/2019,reports feeling well few days after the procedure however about a week later started having chest discomfort  Hospital Course:   Atypical chest pain -EKG is unremarkable, has mild T wave inversion in inferior leads which is unchanged from prior, high-sensitivity troponin is negative x2 -Clinically no evidence of ACS at this time -Known CAD, see discussion below increased Imdur dose to 60 mg daily -Also changed Brilinta to Plavix as cardiology felt he may be having a  reaction to Brilinta  New atrial fibrillation -Just went into atrial fib yesterday morning and converted back to sinus rhythm in a few hours -Last echo 3 weeks ago 10/22 with improved EF to 50/55% -Seen by cardiology in consultation, started on Eliquis, we stopped his aspirin -In sinus rhythm with controlled heart rate at this time  CAD/CABG/ischemic cardiomyopathy -As discussed above, remains on aspirin, Brilinta, Coreg, statin -Cath 3 weeks ago, occlusion of SVG to RCA and SVG to OM 2, balloon angioplasty of left circumflex stenosis 10/21 -Increased Imdur dose to 60mg  daily -Dr. Radford Pax was also concerned that he may be having a reaction to his Brilinta hence change this to Plavix daily  CKD stage II Stable  Discharge Exam: Vitals:   02/17/19 0731 02/17/19 1031  BP: 107/67 108/83  Pulse: 64 66  Resp: 19 (!) 24  Temp: (!) 97.4 F (36.3 C) (!) 97.4 F (36.3 C)  SpO2: 97% 96%    General: AAOx3 Cardiovascular: S1S2/RRR Respiratory: CTAB  Discharge Instructions   Discharge Instructions    Diet - low sodium heart healthy   Complete by: As directed    Increase activity slowly   Complete by: As directed      Allergies as of 02/17/2019   No Known Allergies     Medication List    STOP taking these medications   aspirin 81 MG EC tablet   ticagrelor 90 MG Tabs tablet Commonly known as: BRILINTA     TAKE these medications   apixaban 5 MG Tabs tablet Commonly known as: ELIQUIS Take 1 tablet (5 mg total) by mouth 2 (two) times daily.   atorvastatin 80 MG tablet Commonly known as: LIPITOR TAKE 1 TABLET  BY MOUTH EVERY DAY What changed: when to take this   carvedilol 25 MG tablet Commonly known as: COREG Take 1 tablet (25 mg total) by mouth 2 (two) times daily.   clopidogrel 75 MG tablet Commonly known as: PLAVIX Take 1 tablet (75 mg total) by mouth daily.   dicyclomine 20 MG tablet Commonly known as: BENTYL TAKE 1 TABLET BY MOUTH TWICE A DAY What  changed:   when to take this  reasons to take this   ezetimibe 10 MG tablet Commonly known as: ZETIA Take 1 tablet (10 mg total) by mouth daily. What changed: when to take this   furosemide 20 MG tablet Commonly known as: LASIX TAKE 1 TABLET BY MOUTH EVERY DAY   isosorbide mononitrate 30 MG 24 hr tablet Commonly known as: IMDUR Take 2 tablets (60 mg total) by mouth daily. What changed: how much to take   lisinopril-hydrochlorothiazide 20-25 MG tablet Commonly known as: ZESTORETIC TAKE 1 TABLET BY MOUTH EVERY DAY   montelukast 10 MG tablet Commonly known as: SINGULAIR Take 1 tablet (10 mg total) by mouth at bedtime. What changed: when to take this   multivitamin with minerals Tabs tablet Take 1 tablet by mouth daily.   nitroGLYCERIN 0.4 MG SL tablet Commonly known as: NITROSTAT Place 1 tablet (0.4 mg total) under the tongue every 5 (five) minutes as needed for chest pain.   omeprazole 40 MG capsule Commonly known as: PRILOSEC Take 1 capsule (40 mg total) by mouth daily. What changed:   when to take this  reasons to take this   ondansetron 4 MG tablet Commonly known as: ZOFRAN Take 1 tablet (4 mg total) by mouth every 8 (eight) hours as needed for nausea or vomiting.   potassium chloride SA 20 MEQ tablet Commonly known as: Klor-Con M20 Take 1 tablet (20 mEq total) by mouth daily. What changed: when to take this      No Known Allergies Follow-up Information    Lelon Perla, MD. Schedule an appointment as soon as possible for a visit in 1 week(s).   Specialty: Cardiology Contact information: [address] [place] [address] [phone]            The results of significant diagnostics from this hospitalization (including imaging, microbiology, ancillary and laboratory) are listed below for reference.    Significant Diagnostic Studies: Dg Chest 2 View  Result Date: 02/15/2019 CLINICAL DATA:  Chest pain and shortness of  breath for 3 weeks. EXAM: CHEST - 2 VIEW COMPARISON:  01/22/2019 FINDINGS: AICD noted. Prior CABG. Borderline enlargement of the cardiopericardial silhouette, without edema. The lungs appear clear. No blunting of the costophrenic angles. IMPRESSION: Borderline enlargement of the cardiopericardial silhouette, without edema. Otherwise, no significant abnormalities are observed. Electronically Signed   By: Van Clines M.D.   On: 02/15/2019 19:19   Dg Chest 2 View  Result Date: 01/22/2019 CLINICAL DATA:  No radiographic evidence of acute cardiopulmonary disease. EXAM: CHEST - 2 VIEW COMPARISON:  Chest x-ray 07/03/2017. FINDINGS: Lung volumes are normal. No consolidative airspace disease. No pleural effusions. No pneumothorax. No pulmonary nodule or mass noted. Pulmonary vasculature and the cardiomediastinal silhouette are within normal limits. Aortic atherosclerosis status post median sternotomy. Left-sided pacemaker/AICD with lead tip projecting over the expected location of the right ventricular apex. IMPRESSION: 1.  No radiographic evidence of acute cardiopulmonary disease. 2. Aortic atherosclerosis. Electronically Signed   By: Vinnie Langton M.D.   On: 01/22/2019 13:05   Dg Chest  Portable 1 View  Result Date: 02/15/2019 CLINICAL DATA:  Pulmonary edema.  Chest pain, shortness of breath EXAM: PORTABLE CHEST 1 VIEW COMPARISON:  02/15/2019 FINDINGS: Left AICD remains in place, unchanged. Mild cardiomegaly. Prior CABG. No edema or effusions. No acute bony abnormality. IMPRESSION: Mild cardiomegaly.  No active disease. Electronically Signed   By: Rolm Baptise M.D.   On: 02/15/2019 19:50   Vas [place] Burnard Bunting With/wo Tbi  Result Date: 02/13/2019 LOWER EXTREMITY DOPPLER STUDY Indications: Peripheral artery disease. High Risk Factors: Hypertension, hyperlipidemia, past history of smoking,                    coronary artery disease. Other Factors: Patient states he continues to have right leg cramping after                 walking about 1/2 block. No claudication symptoms in the left                leg.  Vascular Interventions: S/p left SFA stent-unknown date. Comparison Study: In 11/2017, an arterial Doppler showed a right ABI of 0.63 and                   0.98 on the left. Performing Technologist: Wilkie Aye RVT  Examination Guidelines: A complete evaluation includes at minimum, Doppler waveform signals and systolic blood pressure reading at the level of bilateral brachial, anterior tibial, and posterior tibial arteries, when vessel segments are accessible. Bilateral testing is considered an integral part of a complete examination. Photoelectric Plethysmograph (PPG) waveforms and toe systolic pressure readings are included as required and additional duplex testing as needed. Limited examinations for reoccurring indications may be performed as noted.  ABI Findings: +---------+------------------+-----+-------------------+--------+ Right    Rt Pressure (mmHg)IndexWaveform           Comment  +---------+------------------+-----+-------------------+--------+ Brachial 96                                                 +---------+------------------+-----+-------------------+--------+ ATA      56                0.55 dampened monophasic         +---------+------------------+-----+-------------------+--------+ PTA      65                0.64 dampened monophasic         +---------+------------------+-----+-------------------+--------+ PERO     0                 0.00 absent                      +---------+------------------+-----+-------------------+--------+ Great Toe51                0.50 Abnormal                    +---------+------------------+-----+-------------------+--------+ +---------+------------------+-----+--------+-------+ Left     Lt Pressure (mmHg)IndexWaveformComment +---------+------------------+-----+--------+-------+ Brachial 101                                     +---------+------------------+-----+--------+-------+ ATA      83                0.82                 +---------+------------------+-----+--------+-------+  PTA      97                0.96                 +---------+------------------+-----+--------+-------+ PERO     99                0.98                 +---------+------------------+-----+--------+-------+ Great Toe108               1.07                 +---------+------------------+-----+--------+-------+ +-------+-----------+-----------+------------+------------+ ABI/TBIToday's ABIToday's TBIPrevious ABIPrevious TBI +-------+-----------+-----------+------------+------------+ Right  0.64       0.50       0.63        0.58         +-------+-----------+-----------+------------+------------+ Left   0.98       1.07       0.98        1.18         +-------+-----------+-----------+------------+------------+ Bilateral TBIs and ABIs appear essentially unchanged compared to prior study on 11/2017.  See arterial duplex report  Summary: Right: Resting right ankle-brachial index indicates moderate right lower extremity arterial disease. The right toe-brachial index is abnormal. Left: Resting left ankle-brachial index is within normal range. No evidence of significant left lower extremity arterial disease. The left toe-brachial index is normal.  *See table(s) above for measurements and observations.  Suggest follow up study in 12 months. Electronically signed by Carlyle Dolly MD on 02/13/2019 at 11:16:00 AM.    Final    Vas [place] Lower Extremity Arterial Duplex  Result Date: 02/13/2019 LOWER EXTREMITY ARTERIAL DUPLEX STUDY Indications: Claudication, and peripheral artery disease. High Risk Factors: Hypertension, hyperlipidemia, Diabetes, past history of                    smoking, coronary artery disease. Other Factors: Patient states he continues to have right leg cramping after                walking about 1/2 block. No claudication  symptoms in the left                leg.  Vascular Interventions: S/p left SFA stent-unknown date. Current ABI:            Today the right ABI was 0.64 and 0.98 on the left. Comparison Study: In 11/2017, an arterial duplex showed a patent left SFA stent. Performing Technologist: Wilkie Aye RVT  Examination Guidelines: A complete evaluation includes B-mode imaging, spectral Doppler, color Doppler, and power Doppler as needed of all accessible portions of each vessel. Bilateral testing is considered an integral part of a complete examination. Limited examinations for reoccurring indications may be performed as noted  +----------+--------+-----+---------------+---------+--------+ LEFT      PSV cm/sRatioStenosis       Waveform Comments +----------+--------+-----+---------------+---------+--------+ CFA Prox  105                         triphasic         +----------+--------+-----+---------------+---------+--------+ CFA Distal91                          triphasic         +----------+--------+-----+---------------+---------+--------+ DFA       60  biphasic          +----------+--------+-----+---------------+---------+--------+ SFA Prox  217          50-74% stenosisbiphasic          +----------+--------+-----+---------------+---------+--------+ SFA Distal43                          triphasic         +----------+--------+-----+---------------+---------+--------+ POP Prox  41                          biphasic          +----------+--------+-----+---------------+---------+--------+ POP Distal35                          triphasic         +----------+--------+-----+---------------+---------+--------+ TP Trunk  51                          triphasic         +----------+--------+-----+---------------+---------+--------+ A focal velocity elevation of 217 cm/s was obtained at ostium SFA with post stenotic turbulence with a VR of 2.4. Findings are  characteristic of 50-74% stenosis.  Left Stent(s): +---------------+--------+--------+---------+--------+ SFA            PSV cm/sStenosisWaveform Comments +---------------+--------+--------+---------+--------+ Prox to Stent  63              triphasic         +---------------+--------+--------+---------+--------+ Proximal Stent 77              triphasic         +---------------+--------+--------+---------+--------+ Mid Stent      93              triphasic         +---------------+--------+--------+---------+--------+ Distal Stent   43              triphasic         +---------------+--------+--------+---------+--------+ Distal to Stent50              triphasic         +---------------+--------+--------+---------+--------+  Summary: Left: Atherosclerosis in the common femoral, femoral and popliteal arteries. 50-74% stenosis in the ostium superficial femoral artery. Patent superficial femoral artery stent with no focal stenosis.  See table(s) above for measurements and observations. Suggest follow up study in 12 months. Electronically signed by Carlyle Dolly MD on 02/13/2019 at 11:26:05 AM.    Final     Microbiology: Recent Results (from the past 240 hour(s))  SARS CORONAVIRUS 2 (TAT 6-24 HRS) Nasopharyngeal Nasopharyngeal Swab     Status: None   Collection Time: 02/15/19  9:13 PM   Specimen: Nasopharyngeal Swab  Result Value Ref Range Status   SARS Coronavirus 2 NEGATIVE NEGATIVE Final    Comment: (NOTE) SARS-CoV-2 target nucleic acids are NOT DETECTED. The SARS-CoV-2 RNA is generally detectable in upper and lower respiratory specimens during the acute phase of infection. Negative results do not preclude SARS-CoV-2 infection, do not rule out co-infections with other pathogens, and should not be used as the sole basis for treatment or other patient management decisions. Negative results must be combined with clinical observations, patient history, and  epidemiological information. The expected result is Negative. Fact Sheet for Patients: SugarRoll.be Fact Sheet for Healthcare Providers: https://www.woods-mathews.com/ This test is not yet approved or cleared by the [place] FDA and  has been authorized for  detection and/or diagnosis of SARS-CoV-2 by FDA under an Emergency Use Authorization (EUA). This EUA will remain  in effect (meaning this test can be used) for the duration of the COVID-19 declaration under Section 56 4(b)(1) of the Act, 21 U.S.C. section 360bbb-3(b)(1), unless the authorization is terminated or revoked sooner. Performed at [facility], [place] [address], [place], [place] [address]   MRSA PCR Screening     Status: None   Collection Time: 02/16/19 12:31 AM   Specimen: Nasopharyngeal  Result Value Ref Range Status   MRSA by PCR NEGATIVE NEGATIVE Final    Comment:        The GeneXpert MRSA Assay (FDA approved for NASAL specimens only), is one component of a comprehensive MRSA colonization surveillance program. It is not intended to diagnose MRSA infection nor to guide or monitor treatment for MRSA infections. Performed at [facility], [place] [address] [place], [place] [address]      Labs: Basic Metabolic Panel: Recent Labs  Lab 02/15/19 1844 02/17/19 0736  NA 136 134*  K 3.9 3.8  CL 100 100  CO2 25 24  GLUCOSE 106* 140*  BUN 17 15  CREATININE 1.27* 1.12  CALCIUM 9.5 9.4  MG 1.9  --    Liver Function Tests: Recent Labs  Lab 02/15/19 1844  AST 29  ALT 43  ALKPHOS 78  BILITOT 0.7  PROT 7.1  ALBUMIN 3.8   No results for input(s): LIPASE, AMYLASE in the last 168 hours. No results for input(s): AMMONIA in the last 168 hours. CBC: Recent Labs  Lab 02/15/19 1844 02/17/19 0736  WBC 13.0* 10.2  HGB 15.6 15.4  HCT 43.4 43.9  MCV 82.2 82.1  PLT 262 247   Cardiac Enzymes: No results for input(s): CKTOTAL, CKMB, CKMBINDEX, TROPONINI  in the last 168 hours. BNP: BNP (last 3 results) Recent Labs    02/15/19 1844  BNP 52.4    ProBNP (last 3 results) No results for input(s): PROBNP in the last 8760 hours.  CBG: No results for input(s): GLUCAP in the last 168 hours.     Signed:  Domenic Polite MD.  [facility] 02/17/2019, 12:11 PM

## 2019-02-17 NOTE — Progress Notes (Signed)
Progress Note  Patient Name: Billy [name] Date of Encounter: 02/17/2019  Primary Cardiologist: Kirk Ruths, MD *  Subjective   Feels muich better.  No problem after starting Plvix.  No angina like what he had in the past.   Inpatient Medications    Scheduled Meds: . alum & mag hydroxide-simeth  30 mL Oral Once   And  . lidocaine  15 mL Oral Once  . apixaban  5 mg Oral BID  . atorvastatin  80 mg Oral QHS  . carvedilol  25 mg Oral BID  . clopidogrel  75 mg Oral Daily  . diltiazem  10 mg Intravenous Once  . ezetimibe  10 mg Oral QHS  . furosemide  20 mg Oral Daily  . isosorbide mononitrate  60 mg Oral Daily  . lisinopril  20 mg Oral Daily  . montelukast  10 mg Oral Daily  . multivitamin with minerals  1 tablet Oral Daily  . pantoprazole  40 mg Oral Daily  . potassium chloride SA  20 mEq Oral QHS   Continuous Infusions:  PRN Meds: acetaminophen, ALPRAZolam, alum & mag hydroxide-simeth, dicyclomine, nitroGLYCERIN, ondansetron (ZOFRAN) IV, ondansetron, zolpidem   Vital Signs    Vitals:   02/16/19 2257 02/16/19 2300 02/17/19 0300 02/17/19 0731  BP: 93/61 93/61  107/67  Pulse: 77 82 72 64  Resp: 13 20 15 19   Temp: 97.8 F (36.6 C)   (!) 97.4 F (36.3 C)  TempSrc: Oral   Oral  SpO2: 99% 93% 95% 97%  Weight:      Height:        Intake/Output Summary (Last 24 hours) at 02/17/2019 0957 Last data filed at 02/17/2019 0300 Gross per 24 hour  Intake 720 ml  Output 800 ml  Net -80 ml   Last 3 Weights 02/16/2019 02/15/2019 02/04/2019  Weight (lbs) 236 lb 12.4 oz 235 lb 237 lb  Weight (kg) 107.4 kg 106.595 kg 107.502 kg      Telemetry    NSR - Personally Reviewed  ECG    11/15 AFib - Personally Reviewed  Physical Exam   GEN: No acute distress.   Neck: No JVD Cardiac: RRR, no murmurs, rubs, or gallops.  Respiratory: Clear to auscultation bilaterally. GI: Soft, nontender, non-distended  MS: No edema; No deformity. Neuro:  Nonfocal  Psych: Normal  affect   Labs    High Sensitivity Troponin:   Recent Labs  Lab 01/22/19 1222 01/22/19 1607 01/23/19 0515 02/15/19 1844 02/15/19 2100  TROPONINIHS 8 9 15 10 11       Chemistry Recent Labs  Lab 02/15/19 1844 02/17/19 0736  NA 136 134*  K 3.9 3.8  CL 100 100  CO2 25 24  GLUCOSE 106* 140*  BUN 17 15  CREATININE 1.27* 1.12  CALCIUM 9.5 9.4  PROT 7.1  --   ALBUMIN 3.8  --   AST 29  --   ALT 43  --   ALKPHOS 78  --   BILITOT 0.7  --   GFRNONAA >60 >60  GFRAA >60 >60  ANIONGAP 11 10     Hematology Recent Labs  Lab 02/15/19 1844 02/17/19 0736  WBC 13.0* 10.2  RBC 5.28 5.35  HGB 15.6 15.4  HCT 43.4 43.9  MCV 82.2 82.1  MCH 29.5 28.8  MCHC 35.9 35.1  RDW 14.2 14.1  PLT 262 247    BNP Recent Labs  Lab 02/15/19 1844  BNP 52.4     DDimer  Recent Labs  Lab 02/15/19 2100  DDIMER 0.33     Radiology    Dg Chest 2 View  Result Date: 02/15/2019 CLINICAL DATA:  Chest pain and shortness of breath for 3 weeks. EXAM: CHEST - 2 VIEW COMPARISON:  01/22/2019 FINDINGS: AICD noted. Prior CABG. Borderline enlargement of the cardiopericardial silhouette, without edema. The lungs appear clear. No blunting of the costophrenic angles. IMPRESSION: Borderline enlargement of the cardiopericardial silhouette, without edema. Otherwise, no significant abnormalities are observed. Electronically Signed   By: Van Clines M.D.   On: 02/15/2019 19:19   Dg Chest Portable 1 View  Result Date: 02/15/2019 CLINICAL DATA:  Pulmonary edema.  Chest pain, shortness of breath EXAM: PORTABLE CHEST 1 VIEW COMPARISON:  02/15/2019 FINDINGS: Left AICD remains in place, unchanged. Mild cardiomegaly. Prior CABG. No edema or effusions. No acute bony abnormality. IMPRESSION: Mild cardiomegaly.  No active disease. Electronically Signed   By: Rolm Baptise M.D.   On: 02/15/2019 19:50    Cardiac Studies   Prior cath results reviewed.  PTCA several month ago.   Patient Profile     53 y.o.  male with CAD, PAF.    Assessment & Plan    1) AFib: back in NSR.  ELiquis for stroke prevention. ? If AFib and diaphoresis was some adenosine mediated phenomenon.   2) CAD: Now on PLavix.  If he tolerates his Plavix today, consider discharge.  Did well walking.  WIll check back with him later in the day.  Watch for bleeding issues.      For questions or updates, please contact [place] Please consult [email or web address] for contact info under        Signed, Larae Grooms, MD  02/17/2019, 9:57 AM

## 2019-02-17 NOTE — Progress Notes (Signed)
Discharge instructions reviewed with patient and spouse.  Both verbalize understanding of appointments, follow up visits and medications and how to use them.  Patient via wheelchair to wife's waiting car.

## 2019-02-17 NOTE — TOC Benefit Eligibility Note (Signed)
Transition of Care [facility]) Benefit Eligibility Note    Patient Details  Name: Billy [name] MRN: [id number] Date of Birth: [birth year]   Medication/Dose: Arne [place]  2.5 MG BID   AND  ELIQUIS  5 MG BID- ALSO  $8.95     Tier: 3 Drug  Prescription Coverage Preferred Pharmacy: [facility]  Spoke with Person/Company/Phone Number:: LIM @ [employer] [facility] # [phone]  Co-Pay: $8.95  Prior Approval: No  Deductible: Met(LOWE INCOME SUBSIDY)  Additional Notes: LOWE INCOME [name] Phone Number: 02/17/2019, 11:46 AM

## 2019-02-17 NOTE — Care Management Obs Status (Signed)
[place] NOTIFICATION   Patient Details  Name: Jaemon Polinsky MRN: [id number] Date of Birth: 04/20/1965   Medicare Observation Status Notification Given:  Yes    Zenon Mayo, RN 02/17/2019, 9:30 AM

## 2019-02-17 NOTE — TOC Transition Note (Signed)
Transition of Care [facility]) - CM/SW Discharge Note   Patient Details  Name: Billy [name] MRN: [id number] Date of Birth: [birth year]  [facility]) CM/SW Contact:  Zenon Mayo, RN Phone Number: 02/17/2019, 11:13 AM   Clinical Narrative:    Patient for dc home, he lives in mobile home.  NCM gave him the 30 day coupon for eliquis, and informed him his refills will run around 8.00.  He was ok with this amount for eliquis. Plavix has generic so should not have a problem with plavix.   Final next level of care: Home/Self Care Barriers to Discharge: No Barriers Identified   Patient Goals and CMS Choice Patient states their goals for this hospitalization and ongoing recovery are:: go home   Choice offered to / list presented to : NA  Discharge Placement                       Discharge Plan and Services                DME Arranged: (NA)         HH Arranged: NA          Social Determinants of Health (SDOH) Interventions     Readmission Risk Interventions No flowsheet data found.

## 2019-02-19 ENCOUNTER — Telehealth: Payer: Self-pay | Admitting: Cardiology

## 2019-02-19 ENCOUNTER — Ambulatory Visit (HOSPITAL_COMMUNITY): Payer: Medicare Other

## 2019-02-19 NOTE — Telephone Encounter (Signed)
Pt c/o medication issue:  1. Name of Medication: apixaban (ELIQUIS) 5 MG TABS tablet  2. How are you currently taking this medication (dosage and times per day)? As directed   3. Are you having a reaction (difficulty breathing--STAT)? Heart is aching and he feels like his heart is racing. He has no energy, and he feels nervous   4. What is your medication issue? Patient is having some side effects from the eliquis. He was recently hospitalized for the same sensation. He was on brilinta before, but was put on eliquis in the hospital. The patient wanted to know if Dr. Stanford Breed could put him on a different medication   The patient feels fine when the medicine wears off, but about 30 minutes after taking the medication he starts to feel sick again.

## 2019-02-19 NOTE — Telephone Encounter (Signed)
Spoke with pt wife, patient reports feeling the same way he felt after taking the brilinta. Follow up scheduled with app. They will call back prior to appointment if symptoms change or worsen.

## 2019-02-19 NOTE — Telephone Encounter (Signed)
Symptoms not typical of apixaban.  May need APP evaluation. Kirk Ruths

## 2019-02-21 ENCOUNTER — Ambulatory Visit (HOSPITAL_COMMUNITY): Payer: Medicare Other

## 2019-02-24 ENCOUNTER — Ambulatory Visit (HOSPITAL_COMMUNITY): Payer: Medicare Other

## 2019-02-24 ENCOUNTER — Encounter: Payer: Self-pay | Admitting: General Practice

## 2019-02-24 NOTE — Progress Notes (Signed)
Cardiology Clinic Note   Patient Name: Billy [name] Date of Encounter: 02/25/2019  Primary Care Provider:  Patient, No Pcp Per Primary Cardiologist:  Billy Ruths, Billy [name]  Patient Profile    Billy [name] 53 year old male presents today for follow-up of his essential hypertension, coronary artery disease, ischemic cardiomyopathy, and unstable angina.  Past Medical History    Past Medical History:  Diagnosis Date   Aortic atherosclerosis ([place])    on CXR   Blood in stool    C. difficile colitis    a. remote hx 2010.   Cardiac arrest - ventricular fibrillation 02/11/2009   a. 02/2009 s/p [employer] Jude ICD.   Chronic systolic CHF (congestive heart failure) ([name])    Coronary artery disease    a. PCI to Cx age 47. b. CABGx4 in 2003. c. BMS to [place] in 12/2007.   Former tobacco use    Hyperlipidemia    Hypertension    Ischemic cardiomyopathy    Marijuana abuse    Myocardial infarct ([place])    NON-ST-SEGMENT ELEVATION MI   Obesity    PAD (peripheral artery disease) ([place])    a. RLE by noninvasive testing - managed medically for now.   Ventricular tachyarrhythmia ([name])    Ventricular tachycardia ([place])    Past Surgical History:  Procedure Laterality Date   CARDIAC DEFIBRILLATOR PLACEMENT     STJ single chamber ICD implanted for secondary prevention   CIRCUMCISION     CORONARY ARTERY BYPASS GRAFT  06/17/01   X 4   CORONARY BALLOON ANGIOPLASTY N/A 01/22/2019   Procedure: CORONARY BALLOON ANGIOPLASTY;  Surgeon: Billy Man, Billy [name];  Location: [facility];  Service: Cardiovascular;  Laterality: N/A;   LEFT HEART CATH AND CORS/GRAFTS ANGIOGRAPHY N/A 01/22/2019   Procedure: LEFT HEART CATH AND CORS/GRAFTS ANGIOGRAPHY;  Surgeon: Billy Man, Billy [name];  Location: [facility];  Service: Cardiovascular;  Laterality: N/A;    Allergies  No Known Allergies  History of Present Illness    Billy [name] has a past medical history of cardiac catheterization  01/22/2019 with an angioplasty of his circumflex which was 80% stenosed post-cath 40% residual stenosis of Ost CX lesion.  He presented to the emergency department on 01/22/2019 with 2-day symptoms of intermittent left-sided chest pain with radiation to his left arm.  He describes the chest discomfort as an ache lasting from 30 seconds to 4 minutes with each episode.  He stated that his chest discomfort was more mild than previous episodes but more frequent.  His PMH also includes CABG times 07/2001 and DES (a. PCI to Cx age 54, BMS to [place] in 12/2007.), prior VT, VF arrest 02/2009 status post ICD, ischemic cardiomyopathy, HFrEF, hypertension, hyperlipidemia, and presumed peripheral arterial disease.  He presents to the emergency department on 02/15/2019 with complaints of atypical chest pain that he noticed with laying down.  He described the chest pain is different from his chest pain prior to his 10/21/ 2020 cath and that is fair.  He also describes episodes of "spells" shortly after taking his Brilinta (acute diaphoresis, weakness, shortness of breath and chest tightness).  His aspirin was stopped at that time and he was started on Eliquis 5 mg twice daily (CHA2DS2-VASc score 4).  His Brilinta was also switched to Plavix at that time.  On 02/19/2019 his wife called the clinic and stated he was having symptoms of chest discomfort, rapid heart rate, fatigue, and nervousness.  The symptoms would last for approximately 30 minutes and  resolve.  He believed he was having a side effect of his Eliquis medication.  He presents the clinic today and states he is feeling much better.  He feels like it took time for the Brilinta medication to leave his system.  He states that over the last several days he has not had any chest discomfort/ache, difficulty breathing, or pain in his chest.  He states he felt well this weekend and was able to handwashing his wife's car.  He states that he is trying to follow a low-sodium diet  and is planning to walk more now that he is feeling better.  He denies chest pain, shortness of breath, lower extremity edema, fatigue, palpitations, melena, hematuria, hemoptysis, diaphoresis, weakness, presyncope, syncope, orthopnea, and PND.   Home Medications    Prior to Admission medications   Medication Sig Start Date End Date Taking? Authorizing Provider  apixaban (ELIQUIS) 5 MG TABS tablet Take 1 tablet (5 mg total) by mouth 2 (two) times daily. 02/17/19   Billy Polite, Billy [name]  atorvastatin (LIPITOR) 80 MG tablet TAKE 1 TABLET BY MOUTH EVERY DAY Patient taking differently: Take 80 mg by mouth at bedtime.  02/10/19   Billy Perla, Billy [name]  carvedilol (COREG) 25 MG tablet Take 1 tablet (25 mg total) by mouth 2 (two) times daily. 01/21/19   Billy Perla, Billy [name]  clopidogrel (PLAVIX) 75 MG tablet Take 1 tablet (75 mg total) by mouth daily. 02/17/19   Billy Margarita, Billy [name]  dicyclomine (BENTYL) 20 MG tablet TAKE 1 TABLET BY MOUTH TWICE A DAY Patient taking differently: Take 20 mg by mouth 2 (two) times daily as needed for spasms.  04/01/18   Billy Rakes, Billy [name]  ezetimibe (ZETIA) 10 MG tablet Take 1 tablet (10 mg total) by mouth daily. Patient taking differently: Take 10 mg by mouth at bedtime.  01/22/19   Billy Perla, Billy [name]  furosemide (LASIX) 20 MG tablet TAKE 1 TABLET BY MOUTH EVERY DAY Patient taking differently: Take 20 mg by mouth daily.  11/29/18   Billy Perla, Billy [name]  isosorbide mononitrate (IMDUR) 30 MG 24 hr tablet Take 2 tablets (60 mg total) by mouth daily. 02/16/19 08/15/19  Billy Polite, Billy [name]  lisinopril-hydrochlorothiazide (ZESTORETIC) 20-25 MG tablet TAKE 1 TABLET BY MOUTH EVERY DAY Patient taking differently: Take 1 tablet by mouth daily.  02/10/19   Billy Perla, Billy [name]  montelukast (SINGULAIR) 10 MG tablet Take 1 tablet (10 mg total) by mouth at bedtime. Patient taking differently: Take 10 mg by mouth daily.  08/23/17   Billy Rakes, Billy [name]  Multiple Vitamin  (MULTIVITAMIN WITH MINERALS) TABS tablet Take 1 tablet by mouth daily.    Provider, Historical, Billy [name]  nitroGLYCERIN (NITROSTAT) 0.4 MG SL tablet Place 1 tablet (0.4 mg total) under the tongue every 5 (five) minutes as needed for chest pain. 05/31/18   Billy Perla, Billy [name]  omeprazole (PRILOSEC) 40 MG capsule Take 1 capsule (40 mg total) by mouth daily. Patient taking differently: Take 40 mg by mouth daily as needed (acid reflux/indigestion).  06/03/18   Billy Perla, Billy [name]  ondansetron (ZOFRAN) 4 MG tablet Take 1 tablet (4 mg total) by mouth every 8 (eight) hours as needed for nausea or vomiting. 08/23/17   Billy Rakes, Billy [name]  potassium chloride SA (KLOR-CON M20) 20 MEQ tablet Take 1 tablet (20 mEq total) by mouth daily. Patient taking differently: Take 20 mEq by mouth at bedtime.  05/31/18   Billy Perla, Billy [name]    Family History  Family History  Problem Relation Age of Onset   Hypertension Mother    Heart attack Father        DIED AT 67 FROM MI   Heart disease Sister        CAD AND PREVIOUS CABG   Hypertension Other        IN MOST OF HIS SIBLINGS   He indicated that his mother is alive. He indicated that his father is deceased. He indicated that the status of his sister is unknown. He indicated that his maternal grandmother is deceased. He indicated that his maternal grandfather is deceased. He indicated that his paternal grandmother is deceased. He indicated that his paternal grandfather is deceased. He indicated that the status of his other is unknown.  Social History    Social History   Socioeconomic History   Marital status: Married    Spouse name: Not on file   Number of children: 3   Years of education: 11   Highest education level: Not on file  Occupational History   Occupation: Disability  Social Designer, fashion/clothing strain: Not on file   Food insecurity    Worry: Not on file    Inability: Not on file   Transportation needs    Medical: Not on file      Non-medical: Not on file  Tobacco Use   Smoking status: Former Smoker    Packs/day: 2.00    Years: 20.00    Pack years: 40.00    Quit date: 04/04/2003    Years since quitting: 15.9   Smokeless tobacco: Never Used  Substance and Sexual Activity   Alcohol use: No   Drug use: No   Sexual activity: Yes    Partners: Female  Lifestyle   Physical activity    Days per week: 7 days    Minutes per session: 30 min   Stress: Not at all  Relationships   Social connections    Talks on phone: More than three times a week    Gets together: More than three times a week    Attends religious service: Patient refused    Active member of club or organization: Patient refused    Attends meetings of clubs or organizations: Patient refused    Relationship status: Married   Intimate partner violence    Fear of current or ex partner: No    Emotionally abused: No    Physically abused: No    Forced sexual activity: No  Other Topics Concern   Not on file  Social History Narrative   MARRIED   FORMER TOBACCO USE, SMOKED FOR 20 YRS 2 PPD; QUIT IN 2005   NO ETOH   NO ILLICIT DRUG USE   NO REGULAR EXERCISE         ICD-ST. [facility] ; CERTIFIED LETTER SENT AND RETURNED BAD ADDRESS, PLS UPDATE DJW.      Fun: Fishing      Review of Systems    General:  No chills, fever, night sweats or weight changes.  Cardiovascular:  No chest pain, dyspnea on exertion, edema, orthopnea, palpitations, paroxysmal nocturnal dyspnea. Dermatological: No rash, lesions/masses Respiratory: No cough, dyspnea Urologic: No hematuria, dysuria Abdominal:   No nausea, vomiting, diarrhea, bright red blood per rectum, melena, or hematemesis Neurologic:  No visual changes, wkns, changes in mental status. All other systems reviewed and are otherwise negative except as noted above.  Physical Exam    VS:  BP 102/72    Pulse 64  Ht 5\' 11"  (1.803 m)    Wt 245 lb (111.1 kg)    SpO2 93%    BMI 34.17 kg/m  , BMI Body mass  index is 34.17 kg/m. GEN: Well nourished, well developed, in no acute distress. HEENT: normal. Neck: Supple, no JVD, carotid bruits, or masses. Cardiac: RRR, no murmurs, rubs, or gallops. No clubbing, cyanosis, edema.  Radials/DP/PT 2+ and equal bilaterally.  Respiratory:  Respirations regular and unlabored, clear to auscultation bilaterally. GI: Soft, nontender, nondistended, BS + x 4. MS: no deformity or atrophy. Skin: warm and dry, no rash. Neuro:  Strength and sensation are intact. Psych: Normal affect.  Accessory Clinical Findings    ECG personally reviewed by me today-normal sinus rhythm possible left atrial enlargement possible left inferior infarct undetermined age 60 bpm- No acute changes  EKG 02/16/2019 Atrial fibrillation 94 bpm  Cardiac catheterization 01/22/2019 POTENTIAL CULPRIT LESION: Colon Flattery Cx lesion is 80% stenosed.  Scoring balloon angioplasty was performed using a BALLOON WOLVERINE 3.00X6.  Post intervention, there is a 40% residual stenosis.  -----------------------------------  1st Diag lesion is 100% stenosed. - grafted.  Ost Cx to Prox Cx lesion is 35% stenosed.  Previously placed Prox Cx stent (unknown type) is widely patent - as it goes into 1st Mrg stent, where it is ~5% stenosed.  Prox Cx to Mid Cx lesion is 100% stenosed- within the Stent @ 1st Marg takeoff. Distal Cx-OM fill late va L-L collaterals.  Prox RCA lesion is 70% stenosed. Prox RCA to Mid RCA lesion is 85% stenosed. Mid RCA to Dist RCA lesion is 100% stenosed.  -----------GRAFTS-previously known 100% SVG-RCA & SVG-OM2---------------------  LIMA-LAD graft was visualized by angiography and is small/atretic due to competive LAD flow. Mid Graft lesion is 100% stenosed.  SVG-1st Diag graft was visualized by angiography and is normal in caliber. Origin lesion is 40% stenosed.  SVG-RCA graft was injected, but not visualized due to known occlusion. Origin to Prox Graft lesion is 100%  stenosed.  SVG-Cx(OM2) graft was not visualized due to known occlusion. Origin lesion is 100% stenosed.  -----------------HEMODYNAMICS-------------------------------  There is moderate left ventricular systolic dysfunction. The left ventricular ejection fraction is 35-45% by visual estimate. -Inferior akinesis-hypokinesis  LV end diastolic pressure is normal.  EchocardiogramTTE 01/23/2019 IMPRESSIONS 1. Left ventricular ejection fraction, by visual estimation, is 50 to 55%. The left ventricle has mildly decreased function. Moderately increased left ventricular size. There is no left ventricular hypertrophy. 2. Mild inferior and apical hypokinesis 3. Left ventricular diastolic Doppler parameters are consistent with impaired relaxation pattern of LV diastolic filling. 4. Global right ventricle has mildly reduced systolic function.The right ventricular size is normal. No increase in right ventricular wall thickness. 5. Left atrial size was normal. 6. Right atrial size was normal. 7. The mitral valve is grossly normal. Trace mitral valve regurgitation. 8. The tricuspid valve is grossly normal. Tricuspid valve regurgitation was not visualized by color flow Doppler. 9. The aortic valve is tricuspid Aortic valve regurgitation was not visualized by color flow Doppler. 10. The pulmonic valve was grossly normal. Pulmonic valve regurgitation is not visualized by color flow Doppler. 11. The inferior vena cava is normal in size with greater than 50% respiratory variability, suggesting right atrial pressure of 3 mmHg.  Assessment & Plan   1.  Shortness of breath-patient had intolerance to Brilinta medication and was switched to clopidogrel.  He feels well today and has no shortness of breath, chest discomfort, or fatigue. Continue current medical management  New onset  atrial fibrillation-seen on 02/15/2019 ED visit.  Spontaneous conversion back to NSR.  EKG today shows normal sinus rhythm 64  bpm Continue Eliquis 5 mg twice daily  Coronary artery disease-cardiac catheterization 01/22/2019 with angioplasty to circumflex lesion.  Cath report above. Continue clopidogrel 75 mg daily continue apixaban 5 mg twice daily Continue atorvastatin 80 mg daily Continue isosorbide mononitrate 60 mg tablet daily Continue nitroglycerin 0.4 mg sublingual tablet as needed Increase physical activity as tolerated Heart healthy low-sodium diet  Essential hypertension-BP today 102/72.  Well-controlled at home Continue lisinopril-hydrochlorothiazide 20-25 mg 1 tablet daily Continue furosemide 20 mg tablet daily Continue potassium chloride 20 mEq daily Heart healthy low-sodium diet Increase physical activity as tolerated  Hyperlipidemia-01/23/2019: Cholesterol 112; HDL 29; LDL Cholesterol 69; Triglycerides 68; VLDL 14 Continue atorvastatin 80 mg tablet daily Continue Zetia 10 mg tablet daily Heart healthy high-fiber low-sodium diet Increase physical activity as tolerated  Disposition: Follow-up with Dr. Stanford Breed in January.  Jossie Ng. [facility] [address] Office [phone] Fax [phone]

## 2019-02-25 ENCOUNTER — Other Ambulatory Visit: Payer: Self-pay

## 2019-02-25 ENCOUNTER — Encounter: Payer: Self-pay | Admitting: General Practice

## 2019-02-25 ENCOUNTER — Ambulatory Visit (INDEPENDENT_AMBULATORY_CARE_PROVIDER_SITE_OTHER): Payer: Medicare Other | Admitting: General Practice

## 2019-02-25 VITALS — BP 102/72 | HR 64 | Ht 71.0 in | Wt 245.0 lb

## 2019-02-25 DIAGNOSIS — R0789 Other chest pain: Secondary | ICD-10-CM | POA: Diagnosis not present

## 2019-02-25 DIAGNOSIS — R0602 Shortness of breath: Secondary | ICD-10-CM | POA: Diagnosis not present

## 2019-02-25 NOTE — Patient Instructions (Signed)
Medication Instructions:  The current medical regimen is effective;  continue present plan and medications as directed. Please refer to the Current Medication list given to you today. If you need a refill on your cardiac medications before your next appointment, please call your pharmacy.  Special Instructions: Wear shoes/slippers all the time for you vascular disease.  Follow-Up: KEEP SCHEDULED APPOINTMENTS  At [facility], you and your health needs are our priority.  As part of our continuing mission to provide you with exceptional heart care, we have created designated Provider Care Teams.  These Care Teams include your primary Cardiologist (physician) and Advanced Practice Providers (APPs -  Physician Assistants and Nurse Practitioners) who all work together to provide you with the care you need, when you need it.  Thank you for choosing [facility]!!

## 2019-02-26 ENCOUNTER — Ambulatory Visit (INDEPENDENT_AMBULATORY_CARE_PROVIDER_SITE_OTHER): Payer: Medicare Other | Admitting: *Deleted

## 2019-02-26 ENCOUNTER — Ambulatory Visit (HOSPITAL_COMMUNITY): Payer: Medicare Other

## 2019-02-26 DIAGNOSIS — I255 Ischemic cardiomyopathy: Secondary | ICD-10-CM | POA: Diagnosis not present

## 2019-02-27 LAB — CUP PACEART REMOTE DEVICE CHECK
Battery Remaining Longevity: 28 mo
Battery Remaining Percentage: 23 %
Battery Voltage: 2.8 V
Brady Statistic RV Percent Paced: 1 %
Date Time Interrogation Session: 20201125025853
HighPow Impedance: 57 Ohm
Implantable Lead Implant Date: 20101111
Implantable Lead Location: 753860
Implantable Lead Model: 7121
Implantable Pulse Generator Implant Date: 20101111
Lead Channel Impedance Value: 480 Ohm
Lead Channel Pacing Threshold Amplitude: 1 V
Lead Channel Pacing Threshold Pulse Width: 0.5 ms
Lead Channel Sensing Intrinsic Amplitude: 12 mV
Lead Channel Setting Pacing Amplitude: 2.5 V
Lead Channel Setting Pacing Pulse Width: 0.5 ms
Lead Channel Setting Sensing Sensitivity: 0.5 mV
Pulse Gen Serial Number: 743367

## 2019-02-28 ENCOUNTER — Ambulatory Visit (HOSPITAL_COMMUNITY): Payer: Medicare Other

## 2019-03-03 ENCOUNTER — Ambulatory Visit (HOSPITAL_COMMUNITY): Payer: Medicare Other

## 2019-03-05 ENCOUNTER — Ambulatory Visit (HOSPITAL_COMMUNITY): Payer: Medicare Other

## 2019-03-05 ENCOUNTER — Ambulatory Visit: Payer: Medicare Other | Admitting: General Practice

## 2019-03-07 ENCOUNTER — Ambulatory Visit (HOSPITAL_COMMUNITY): Payer: Medicare Other

## 2019-03-10 ENCOUNTER — Ambulatory Visit (HOSPITAL_COMMUNITY): Payer: Medicare Other

## 2019-03-12 ENCOUNTER — Ambulatory Visit (HOSPITAL_COMMUNITY): Payer: Medicare Other

## 2019-03-14 ENCOUNTER — Ambulatory Visit (HOSPITAL_COMMUNITY): Payer: Medicare Other

## 2019-03-17 ENCOUNTER — Other Ambulatory Visit: Payer: Self-pay | Admitting: Cardiology

## 2019-03-17 ENCOUNTER — Ambulatory Visit (HOSPITAL_COMMUNITY): Payer: Medicare Other

## 2019-03-19 ENCOUNTER — Other Ambulatory Visit: Payer: Self-pay | Admitting: Cardiology

## 2019-03-19 ENCOUNTER — Ambulatory Visit (HOSPITAL_COMMUNITY): Payer: Medicare Other

## 2019-03-19 ENCOUNTER — Telehealth: Payer: Self-pay | Admitting: Cardiology

## 2019-03-19 MED ORDER — FUROSEMIDE 20 MG PO TABS: 20.0000 mg | ORAL_TABLET | Freq: Every day | ORAL | 3 refills | Status: DC

## 2019-03-19 MED ORDER — APIXABAN 5 MG PO TABS: 5.0000 mg | ORAL_TABLET | Freq: Two times a day (BID) | ORAL | 3 refills | Status: DC

## 2019-03-19 NOTE — Telephone Encounter (Signed)
RX sent in to the Pharmacy per pt request for Eliquis and lasix.

## 2019-03-19 NOTE — Telephone Encounter (Signed)
Pt c/o medication issue:  1. Name of Medication: apixaban (ELIQUIS) 5 MG TABS tablet  2. How are you currently taking this medication (dosage and times per day)? As directed but ran out  3. Are you having a reaction (difficulty breathing--STAT)? no  4. What is your medication issue? Patients wife states that the pharmacy has denied refilling it 3 times and has kicked the prescription out. She needs to know if he should continue taking this medication. Please advise.

## 2019-03-21 ENCOUNTER — Ambulatory Visit (HOSPITAL_COMMUNITY): Payer: Medicare Other

## 2019-03-22 ENCOUNTER — Other Ambulatory Visit: Payer: Self-pay | Admitting: Cardiology

## 2019-03-22 DIAGNOSIS — E7800 Pure hypercholesterolemia, unspecified: Secondary | ICD-10-CM

## 2019-03-24 ENCOUNTER — Ambulatory Visit (HOSPITAL_COMMUNITY): Payer: Medicare Other

## 2019-03-26 ENCOUNTER — Ambulatory Visit (HOSPITAL_COMMUNITY): Payer: Medicare Other

## 2019-03-31 ENCOUNTER — Ambulatory Visit (HOSPITAL_COMMUNITY): Payer: Medicare Other

## 2019-04-02 ENCOUNTER — Ambulatory Visit (HOSPITAL_COMMUNITY): Payer: Medicare Other

## 2019-04-07 ENCOUNTER — Ambulatory Visit (HOSPITAL_COMMUNITY): Payer: Medicare Other

## 2019-04-09 ENCOUNTER — Ambulatory Visit (HOSPITAL_COMMUNITY): Payer: Medicare Other

## 2019-04-11 ENCOUNTER — Ambulatory Visit (HOSPITAL_COMMUNITY): Payer: Medicare Other

## 2019-04-11 NOTE — Progress Notes (Signed)
HPI: FU CAD. Patient suffered a cardiac arrest in November of 2010. Echocardiogram initially revealed an EF of 10-15% however followup echo showed an EF of 40-45%, mild biatrial enlargement and mild mitral regurgitation. Given out of the hospital VF arrest a St. Jude single lead ICD was placed. Had repeat cardiac catheterization October 2020 secondary to chest pain.  He was found to have known occlusion of the right coronary, OM 2 and minimal disease in the LAD; also noted to have an 80% circumflex which was treated with PTCA.  Ejection fraction 40 to 45%..  There was known occlusion of the saphenous vein graft to the right coronary artery and saphenous vein graft to the OM 2.  The LIMA to the LAD was atretic.  Saphenous vein graft to first diagonal 40 to 50%.  Echocardiogram October 2020 showed ejection fraction 50 to [id number], grade 1 diastolic dysfunction.  ABIs November 2020 with moderate right lower extremity arterial disease and normal on the left. PVD followed by Dr Fletcher Anon. His Brilinta was changed to Plavix due to dyspnea in November.  Also had episode of atrial fibrillation November 2020.  Since I last saw him,he occasionally has mild dyspnea and an aching pain in his chest in the morning that has been present ever since his angioplasty.  He denies exertional chest pain or syncope.  No bleeding.  Occasional brief flutters but no sustained palpitations.  He does have claudication in his right lower extremity that limits his ability to walk.  Current Outpatient Medications  Medication Sig Dispense Refill  . apixaban (ELIQUIS) 5 MG TABS tablet Take 1 tablet (5 mg total) by mouth 2 (two) times daily. 180 tablet 3  . atorvastatin (LIPITOR) 80 MG tablet TAKE 1 TABLET BY MOUTH EVERY DAY (Patient taking differently: Take 80 mg by mouth at bedtime. ) 90 tablet 3  . carvedilol (COREG) 25 MG tablet Take 1 tablet (25 mg total) by mouth 2 (two) times daily. 180 tablet 3  . clopidogrel (PLAVIX) 75 MG tablet  Take 1 tablet (75 mg total) by mouth daily. 90 tablet 3  . dicyclomine (BENTYL) 20 MG tablet TAKE 1 TABLET BY MOUTH TWICE A DAY (Patient taking differently: Take 20 mg by mouth 2 (two) times daily as needed for spasms. ) 180 tablet 1  . ezetimibe (ZETIA) 10 MG tablet TAKE 1 TABLET BY MOUTH EVERY DAY 90 tablet 0  . furosemide (LASIX) 20 MG tablet Take 1 tablet (20 mg total) by mouth daily. 90 tablet 3  . isosorbide mononitrate (IMDUR) 30 MG 24 hr tablet Take 2 tablets (60 mg total) by mouth daily.  0  . lisinopril-hydrochlorothiazide (ZESTORETIC) 20-25 MG tablet TAKE 1 TABLET BY MOUTH EVERY DAY (Patient taking differently: Take 1 tablet by mouth daily. ) 90 tablet 3  . montelukast (SINGULAIR) 10 MG tablet Take 1 tablet (10 mg total) by mouth at bedtime. (Patient taking differently: Take 10 mg by mouth daily. ) 90 tablet 1  . Multiple Vitamin (MULTIVITAMIN WITH MINERALS) TABS tablet Take 1 tablet by mouth daily.    . nitroGLYCERIN (NITROSTAT) 0.4 MG SL tablet Place 1 tablet (0.4 mg total) under the tongue every 5 (five) minutes as needed for chest pain. 25 tablet 3  . omeprazole (PRILOSEC) 40 MG capsule Take 1 capsule (40 mg total) by mouth daily. (Patient taking differently: Take 40 mg by mouth daily as needed (acid reflux/indigestion). ) 90 capsule 3  . ondansetron (ZOFRAN) 4 MG tablet Take 1 tablet (  4 mg total) by mouth every 8 (eight) hours as needed for nausea or vomiting. 30 tablet 1  . potassium chloride SA (KLOR-CON M20) 20 MEQ tablet Take 1 tablet (20 mEq total) by mouth daily. (Patient taking differently: Take 20 mEq by mouth at bedtime. ) 90 tablet 1   No current facility-administered medications for this visit.     Past Medical History:  Diagnosis Date  . Aortic atherosclerosis ([place])    on CXR  . Blood in stool   . C. difficile colitis    a. remote hx 2010.  . Cardiac arrest - ventricular fibrillation 02/11/2009   a. 02/2009 s/p St Jude ICD.  [name] Chronic systolic CHF (congestive  heart failure) ([place])   . Coronary artery disease    a. PCI to Cx age 15. b. CABGx4 in 2003. c. BMS to [place] in 12/2007.  [name] Former tobacco use   . Hyperlipidemia   . Hypertension   . Ischemic cardiomyopathy   . Marijuana abuse   . Myocardial infarct ([name])    NON-ST-SEGMENT ELEVATION MI  . Obesity   . PAD (peripheral artery disease) ([place])    a. RLE by noninvasive testing - managed medically for now.  . Ventricular tachyarrhythmia ([place])   . Ventricular tachycardia [facility])     Past Surgical History:  Procedure Laterality Date  . CARDIAC DEFIBRILLATOR PLACEMENT     STJ single chamber ICD implanted for secondary prevention  . CIRCUMCISION    . CORONARY ARTERY BYPASS GRAFT  06/17/01   X 4  . CORONARY BALLOON ANGIOPLASTY N/A 01/22/2019   Procedure: CORONARY BALLOON ANGIOPLASTY;  Surgeon: Leonie Man, MD;  Location: [facility];  Service: Cardiovascular;  Laterality: N/A;  . LEFT HEART CATH AND CORS/GRAFTS ANGIOGRAPHY N/A 01/22/2019   Procedure: LEFT HEART CATH AND CORS/GRAFTS ANGIOGRAPHY;  Surgeon: Leonie Man, MD;  Location: [facility];  Service: Cardiovascular;  Laterality: N/A;    Social History   Socioeconomic History  . Marital status: Married    Spouse name: Not on file  . Number of children: 3  . Years of education: 22  . Highest education level: Not on file  Occupational History  . Occupation: Disability  Tobacco Use  . Smoking status: Former Smoker    Packs/day: 2.00    Years: 20.00    Pack years: 40.00    Quit date: 04/04/2003    Years since quitting: 16.0  . Smokeless tobacco: Never Used  Substance and Sexual Activity  . Alcohol use: No  . Drug use: No  . Sexual activity: Yes    Partners: Female  Other Topics Concern  . Not on file  Social History Narrative   MARRIED   FORMER TOBACCO USE, SMOKED FOR 20 YRS 2 PPD; QUIT IN 2005   NO ETOH   NO ILLICIT DRUG USE   NO REGULAR EXERCISE         ICD-ST. JUDE ; CERTIFIED LETTER SENT AND  RETURNED BAD ADDRESS, PLS UPDATE DJW.      Fun: Physiological scientist Strain:   . Difficulty of Paying Living Expenses: Not on file  Food Insecurity:   . Worried About Charity fundraiser in the Last Year: Not on file  . Ran Out of Food in the Last Year: Not on file  Transportation Needs:   . Lack of Transportation (Medical): Not on file  . Lack of Transportation (Non-Medical): Not on  file  Physical Activity: Sufficiently Active  . Days of Exercise per Week: 7 days  . Minutes of Exercise per Session: 30 min  Stress: No Stress Concern Present  . Feeling of Stress : Not at all  Social Connections: Unknown  . Frequency of Communication with Friends and Family: More than three times a week  . Frequency of Social Gatherings with Friends and Family: More than three times a week  . Attends Religious Services: Patient refused  . Active Member of Clubs or Organizations: Patient refused  . Attends Archivist Meetings: Patient refused  . Marital Status: Married  Human resources officer Violence: Not At Risk  . Fear of Current or Ex-Partner: No  . Emotionally Abused: No  . Physically Abused: No  . Sexually Abused: No    Family History  Problem Relation Age of Onset  . Hypertension Mother   . Heart attack Father        DIED AT 58 FROM MI  . Heart disease Sister        CAD AND PREVIOUS CABG  . Hypertension Other        IN MOST OF HIS SIBLINGS    ROS: no fevers or chills, productive cough, hemoptysis, dysphasia, odynophagia, melena, hematochezia, dysuria, hematuria, rash, seizure activity, orthopnea, PND, pedal edema. Remaining systems are negative.  Physical Exam: Well-developed well-nourished in no acute distress.  Skin is warm and dry.  HEENT is normal.  Neck is supple.  Chest is clear to auscultation with normal expansion.  Cardiovascular exam is regular rate and rhythm.  Abdominal exam nontender or distended. No masses palpated.  Extremities show no edema. neuro grossly intact  A/P  1 coronary artery disease-occasional vague chest aching but no exertional chest pain.  Plan to continue medical therapy with Plavix, statin and beta-blocker.  2 hypertension-blood pressure controlled.  Continue present medications and follow.  Check potassium and renal function.  3 hyperlipidemia-continue statin.    4 ischemic cardiomyopathy-continue lisinopril and beta-blocker.  Patient did not tolerate Entresto previously.  5 paroxysmal atrial fibrillation-patient in sinus rhythm on examination today.  Continue apixaban and beta-blocker.  Check hemoglobin.  6 prior ICD-followed by electrophysiology.  7 tobacco abuse-patient has discontinued.  8 peripheral vascular disease-plan to continue medical therapy with Plavix and statin.  Followed by Dr. Fletcher Anon and scheduled for February.  He does complain of right lower extremity claudication and will likely need intervention.  Kirk Ruths, MD

## 2019-04-17 ENCOUNTER — Ambulatory Visit (INDEPENDENT_AMBULATORY_CARE_PROVIDER_SITE_OTHER): Payer: Medicare Other | Admitting: Cardiology

## 2019-04-17 ENCOUNTER — Other Ambulatory Visit: Payer: Self-pay

## 2019-04-17 ENCOUNTER — Encounter: Payer: Self-pay | Admitting: Cardiology

## 2019-04-17 VITALS — BP 104/79 | HR 61 | Temp 95.9°F | Ht 71.0 in | Wt 257.0 lb

## 2019-04-17 DIAGNOSIS — I10 Essential (primary) hypertension: Secondary | ICD-10-CM | POA: Diagnosis not present

## 2019-04-17 DIAGNOSIS — I739 Peripheral vascular disease, unspecified: Secondary | ICD-10-CM

## 2019-04-17 DIAGNOSIS — I255 Ischemic cardiomyopathy: Secondary | ICD-10-CM

## 2019-04-17 DIAGNOSIS — I480 Paroxysmal atrial fibrillation: Secondary | ICD-10-CM

## 2019-04-17 DIAGNOSIS — E7800 Pure hypercholesterolemia, unspecified: Secondary | ICD-10-CM | POA: Diagnosis not present

## 2019-04-17 DIAGNOSIS — I25118 Atherosclerotic heart disease of native coronary artery with other forms of angina pectoris: Secondary | ICD-10-CM

## 2019-04-17 MED ORDER — ISOSORBIDE MONONITRATE ER 30 MG PO TB24: 60.0000 mg | ORAL_TABLET | Freq: Every day | ORAL | 3 refills | Status: DC

## 2019-04-17 MED ORDER — POTASSIUM CHLORIDE CRYS ER 20 MEQ PO TBCR: 20.0000 meq | EXTENDED_RELEASE_TABLET | Freq: Every day | ORAL | 3 refills | Status: DC

## 2019-04-17 MED ORDER — LISINOPRIL-HYDROCHLOROTHIAZIDE 20-25 MG PO TABS: 1.0000 | ORAL_TABLET | Freq: Every day | ORAL | 3 refills | Status: DC

## 2019-04-17 MED ORDER — EZETIMIBE 10 MG PO TABS: 10.0000 mg | ORAL_TABLET | Freq: Every day | ORAL | 3 refills | Status: DC

## 2019-04-17 MED ORDER — OMEPRAZOLE 40 MG PO CPDR: 40.0000 mg | DELAYED_RELEASE_CAPSULE | Freq: Every day | ORAL | 3 refills | Status: DC

## 2019-04-17 MED ORDER — CARVEDILOL 25 MG PO TABS: 25.0000 mg | ORAL_TABLET | Freq: Two times a day (BID) | ORAL | 3 refills | Status: DC

## 2019-04-17 NOTE — Patient Instructions (Signed)
Medication Instructions:  Refill sent to the pharmacy electronically.  *If you need a refill on your cardiac medications before your next appointment, please call your pharmacy*  Lab Work: Your physician recommends that you HAVE LAB WORK TODAY If you have labs (blood work) drawn today and your tests are completely normal, you will receive your results only by: [name] MyChart Message (if you have MyChart) OR . A paper copy in the mail If you have any lab test that is abnormal or we need to change your treatment, we will call you to review the results.  Follow-Up: At [facility], you and your health needs are our priority.  As part of our continuing mission to provide you with exceptional heart care, we have created designated Provider Care Teams.  These Care Teams include your primary Cardiologist (physician) and Advanced Practice Providers (APPs -  Physician Assistants and Nurse Practitioners) who all work together to provide you with the care you need, when you need it.  Your next appointment:   6 month(s)  The format for your next appointment:   Either In Person or Virtual  Provider:   You may see Kirk Ruths, MD or one of the following Advanced Practice Providers on your designated Care Team:    Kerin Ransom, PA-C  [place], [place]  Coletta Memos, [place]

## 2019-04-18 LAB — BASIC METABOLIC PANEL
BUN/Creatinine Ratio: 25 — ABNORMAL HIGH (ref 9–20)
BUN: 26 mg/dL — ABNORMAL HIGH (ref 6–24)
CO2: 25 mmol/L (ref 20–29)
Calcium: 9.7 mg/dL (ref 8.7–10.2)
Chloride: 101 mmol/L (ref 96–106)
Creatinine, Ser: 1.06 mg/dL (ref 0.76–1.27)
GFR calc Af Amer: 93 mL/min/{1.73_m2} (ref >59)
GFR calc non Af Amer: 80 mL/min/{1.73_m2} (ref >59)
Glucose: 81 mg/dL (ref 65–99)
Potassium: 4.7 mmol/L (ref 3.5–5.2)
Sodium: 137 mmol/L (ref 134–144)

## 2019-04-18 LAB — CBC
Hematocrit: 45 % (ref 37.5–51.0)
Hemoglobin: 14.9 g/dL (ref 13.0–17.7)
MCH: 29.7 pg (ref 26.6–33.0)
MCHC: 33.1 g/dL (ref 31.5–35.7)
MCV: 90 fL (ref 79–97)
Platelets: 253 10*3/uL (ref 150–450)
RBC: 5.02 x10E6/uL (ref 4.14–5.80)
RDW: 13.5 % (ref 11.6–15.4)
WBC: 10.3 10*3/uL (ref 3.4–10.8)

## 2019-05-05 ENCOUNTER — Telehealth: Payer: Self-pay | Admitting: Emergency Medicine

## 2019-05-05 NOTE — Telephone Encounter (Signed)
Received alert from [facility] that on 05/02/19 @ 1250  The patient had a sudden onset of VT at 206 bpm ,patient received ATP x 3 that was unsuccessful , accelerated to VF and received 650 V shock that successfully terminated event. Patient reports that at the time of the event he was raking leaves,  began to feel dizzy then passed out. His wife reported that she saw him "fall" and that he was only unconscious for a few seconds. Patient was unaware that he was treated by his device and felt no other symptoms after the episode of syncope. No missed doses of Coreg or Eliquis. Shock plan reviewed and made aware of [place] DMV driving restrictions due to therapy received from device.

## 2019-05-06 ENCOUNTER — Encounter: Payer: Self-pay | Admitting: Cardiovascular Disease

## 2019-05-06 ENCOUNTER — Ambulatory Visit (INDEPENDENT_AMBULATORY_CARE_PROVIDER_SITE_OTHER): Payer: Medicare Other | Admitting: Cardiovascular Disease

## 2019-05-06 ENCOUNTER — Other Ambulatory Visit: Payer: Self-pay

## 2019-05-06 VITALS — BP 143/72 | Temp 97.0°F | Ht 71.0 in | Wt 265.5 lb

## 2019-05-06 DIAGNOSIS — I739 Peripheral vascular disease, unspecified: Secondary | ICD-10-CM | POA: Diagnosis not present

## 2019-05-06 DIAGNOSIS — I5022 Chronic systolic (congestive) heart failure: Secondary | ICD-10-CM

## 2019-05-06 DIAGNOSIS — I255 Ischemic cardiomyopathy: Secondary | ICD-10-CM | POA: Diagnosis not present

## 2019-05-06 DIAGNOSIS — I2510 Atherosclerotic heart disease of native coronary artery without angina pectoris: Secondary | ICD-10-CM

## 2019-05-06 DIAGNOSIS — E785 Hyperlipidemia, unspecified: Secondary | ICD-10-CM | POA: Diagnosis not present

## 2019-05-06 LAB — CBC
Hematocrit: 48.1 % (ref 37.5–51.0)
Hemoglobin: 15.6 g/dL (ref 13.0–17.7)
MCH: 28.1 pg (ref 26.6–33.0)
MCHC: 32.4 g/dL (ref 31.5–35.7)
MCV: 87 fL (ref 79–97)
Platelets: 262 x10E3/uL (ref 150–450)
RBC: 5.55 x10E6/uL (ref 4.14–5.80)
RDW: 13.4 % (ref 11.6–15.4)
WBC: 10.5 x10E3/uL (ref 3.4–10.8)

## 2019-05-06 LAB — BASIC METABOLIC PANEL WITH GFR
BUN/Creatinine Ratio: 18 (ref 9–20)
BUN: 23 mg/dL (ref 6–24)
CO2: 23 mmol/L (ref 20–29)
Calcium: 10.1 mg/dL (ref 8.7–10.2)
Chloride: 100 mmol/L (ref 96–106)
Creatinine, Ser: 1.26 mg/dL (ref 0.76–1.27)
GFR calc Af Amer: 75 mL/min/1.73
GFR calc non Af Amer: 65 mL/min/1.73
Glucose: 101 mg/dL — ABNORMAL HIGH (ref 65–99)
Potassium: 4.7 mmol/L (ref 3.5–5.2)
Sodium: 138 mmol/L (ref 134–144)

## 2019-05-06 NOTE — Patient Instructions (Signed)
Medication Instructions:  No changes *If you need a refill on your cardiac medications before your next appointment, please call your pharmacy*  Lab Work: Your provider would like for you to have the following labs today: CBC and BMET  If you have labs (blood work) drawn today and your tests are completely normal, you will receive your results only by: [name] MyChart Message (if you have MyChart) OR . A paper copy in the mail If you have any lab test that is abnormal or we need to change your treatment, we will call you to review the results.  Testing/Procedures: Your physician has requested that you have a peripheral vascular angiogram. This exam is performed at the hospital. During this exam IV contrast is used to look at arterial blood flow. Please review the information sheet given for details.  Follow-Up: At [facility], you and your health needs are our priority.  As part of our continuing mission to provide you with exceptional heart care, we have created designated Provider Care Teams.  These Care Teams include your primary Cardiologist (physician) and Advanced Practice Providers (APPs -  Physician Assistants and Nurse Practitioners) who all work together to provide you with the care you need, when you need it.  Your next appointment:   1 month(s)  The format for your next appointment:   In Person  Provider:   [name], MD  Other Instructions    [address] [place] [address] Dept: [phone] Loc: [phone]  Vinnie Dionne  05/06/2019  You are scheduled for a Peripheral Angiogram on Wednesday, February 10 with Dr. [name].  1. Please arrive at the [facility] (Main Entrance A) at [facility]: [address] [place], [place] [address] at 6:30 AM (This time is two hours before your procedure to ensure your preparation). Free valet parking service  is available.   Special note: Every effort is made to have your procedure done on time. Please understand that emergencies sometimes delay scheduled procedures.  2. Diet: Do not eat solid foods after midnight.  The patient may have clear liquids until 5am upon the day of the procedure.  3. Labs:  You will need to have the coronavirus test completed prior to your procedure. An appointment has been made at 12:05 pm on 05/09/2018. This is a Drive Up Visit at the [facility] [address]. Someone will direct you to the appropriate testing line. Please tell them that you are there for procedure testing. Stay in your car and someone will be with you shortly. Please make sure to have all other labs completed before this test because you will need to stay quarantined until your procedure.  4. Medication instructions in preparation for your procedure: STOP the Eliquis two days prior to the procedure (hold 2/8 and 2/9) HOLD the Furosemide the morning of the procedure.   On the morning of your procedure, take your Aspirin and Plavix/Clopidogrel and any morning medicines NOT listed above.  You may use sips of water.  5. Plan for one night stay--bring personal belongings. 6. Bring a current list of your medications and current insurance cards. 7. You MUST have a responsible person to drive you home. 8. Someone MUST be with you the first 24 hours after you arrive home or your discharge will be delayed. 9. Please wear clothes that are easy to get on and off and wear slip-on shoes.  Thank you for allowing [place] to care for you!   -- [facility]

## 2019-05-06 NOTE — Telephone Encounter (Signed)
Billy [name]  can you print off the strips and we can review  thanks I am wondering [id number] v VT

## 2019-05-06 NOTE — Progress Notes (Signed)
Cardiology Office Note   Date:  05/06/2019   ID:  Billy [name], DOB 1965-05-21, MRN [id number]  PCP:  No primary care provider on file.  Cardiologist:  Dr. Stanford Breed  No chief complaint on file.     History of Present Illness: Billy [name] is a 54 y.o. male who is here today for follow-up visit regarding peripheral arterial disease.  The patient had previous cardiac arrest in November 2010.  He had previous CABG.  He had an ICD placement done.Other medical problems include hypertension, previous tobacco use , PAD and hyperlipidemia.  He had previous left SFA stent many years ago.   He is being followed by me for severe right calf claudication with moderately reduced ABI in the 0.6 range.  He has left SFA stent that is known to be patent.  He is suspected of having inflow disease on the right side given very diminished right common femoral artery pulse and monophasic waveforms starting in the right common femoral artery. He had unstable angina in October 2020.  Cardiac catheterization showed severe native 2-vessel coronary artery disease.  SVG to RCA and SVG to OM 2 were known to be occluded.  LIMA to LAD was found to be atretic with competitive flow due to minimal disease in the native LAD.  SVG to first diagonal was patent.  There was 80% ostial left circumflex stenosis which was treated with scoring balloon angioplasty.   The patient had brief atrial fibrillation in November and was started on anticoagulation with Eliquis.  Brilinta was switched to Plavix and aspirin was discontinued. He denies any chest pain, shortness of breath or palpitations at the present time.  He continues to be limited with severe right calf claudication.  No rest pain.  Past Medical History:  Diagnosis Date  . Aortic atherosclerosis ([place])    on CXR  . Blood in stool   . C. difficile colitis    a. remote hx 2010.  . Cardiac arrest - ventricular fibrillation 02/11/2009   a. 02/2009 s/p St Jude ICD.  [name]  Chronic systolic CHF (congestive heart failure) ([place])   . Coronary artery disease    a. PCI to Cx age 46. b. CABGx4 in 2003. c. BMS to [place] in 12/2007.  [name] Former tobacco use   . Hyperlipidemia   . Hypertension   . Ischemic cardiomyopathy   . Marijuana abuse   . Myocardial infarct ([name])    NON-ST-SEGMENT ELEVATION MI  . Obesity   . PAD (peripheral artery disease) ([place])    a. RLE by noninvasive testing - managed medically for now.  . Ventricular tachyarrhythmia ([place])   . Ventricular tachycardia [facility])     Past Surgical History:  Procedure Laterality Date  . CARDIAC DEFIBRILLATOR PLACEMENT     STJ single chamber ICD implanted for secondary prevention  . CIRCUMCISION    . CORONARY ARTERY BYPASS GRAFT  06/17/01   X 4  . CORONARY BALLOON ANGIOPLASTY N/A 01/22/2019   Procedure: CORONARY BALLOON ANGIOPLASTY;  Surgeon: Leonie Man, MD;  Location: [facility];  Service: Cardiovascular;  Laterality: N/A;  . LEFT HEART CATH AND CORS/GRAFTS ANGIOGRAPHY N/A 01/22/2019   Procedure: LEFT HEART CATH AND CORS/GRAFTS ANGIOGRAPHY;  Surgeon: Leonie Man, MD;  Location: [facility];  Service: Cardiovascular;  Laterality: N/A;     Current Outpatient Medications  Medication Sig Dispense Refill  . apixaban (ELIQUIS) 5 MG TABS tablet Take 1 tablet (5 mg total) by mouth 2 (two)  times daily. 180 tablet 3  . atorvastatin (LIPITOR) 80 MG tablet TAKE 1 TABLET BY MOUTH EVERY DAY (Patient taking differently: Take 80 mg by mouth at bedtime. ) 90 tablet 3  . carvedilol (COREG) 25 MG tablet Take 1 tablet (25 mg total) by mouth 2 (two) times daily. 180 tablet 3  . clopidogrel (PLAVIX) 75 MG tablet Take 1 tablet (75 mg total) by mouth daily. 90 tablet 3  . dicyclomine (BENTYL) 20 MG tablet TAKE 1 TABLET BY MOUTH TWICE A DAY (Patient taking differently: Take 20 mg by mouth 2 (two) times daily as needed for spasms. ) 180 tablet 1  . ezetimibe (ZETIA) 10 MG tablet Take 1 tablet (10 mg total) by  mouth daily. 90 tablet 3  . furosemide (LASIX) 20 MG tablet Take 1 tablet (20 mg total) by mouth daily. 90 tablet 3  . isosorbide mononitrate (IMDUR) 30 MG 24 hr tablet Take 2 tablets (60 mg total) by mouth daily. 180 tablet 3  . lisinopril-hydrochlorothiazide (ZESTORETIC) 20-25 MG tablet Take 1 tablet by mouth daily. 90 tablet 3  . montelukast (SINGULAIR) 10 MG tablet Take 1 tablet (10 mg total) by mouth at bedtime. (Patient taking differently: Take 10 mg by mouth daily. ) 90 tablet 1  . Multiple Vitamin (MULTIVITAMIN WITH MINERALS) TABS tablet Take 1 tablet by mouth daily.    . nitroGLYCERIN (NITROSTAT) 0.4 MG SL tablet Place 1 tablet (0.4 mg total) under the tongue every 5 (five) minutes as needed for chest pain. 25 tablet 3  . omeprazole (PRILOSEC) 40 MG capsule Take 1 capsule (40 mg total) by mouth daily. 90 capsule 3  . ondansetron (ZOFRAN) 4 MG tablet Take 1 tablet (4 mg total) by mouth every 8 (eight) hours as needed for nausea or vomiting. 30 tablet 1  . potassium chloride SA (KLOR-CON M20) 20 MEQ tablet Take 1 tablet (20 mEq total) by mouth daily. 90 tablet 3   No current facility-administered medications for this visit.    Allergies:   Patient has no known allergies.    Social History:  The patient  reports that he quit smoking about 16 years ago. He has a 40.00 pack-year smoking history. He has never used smokeless tobacco. He reports that he does not drink alcohol or use drugs.   Family History:  The patient's family history includes Heart attack in his father; Heart disease in his sister; Hypertension in his mother and another family member.    ROS:  Please see the history of present illness.   Otherwise, review of systems are positive for none.   All other systems are reviewed and negative.    PHYSICAL EXAM: VS:  BP (!) 143/72   Temp (!) 97 F (36.1 C)   Ht 5\' 11"  (1.803 m)   Wt 265 lb 8 oz (120.4 kg)   BMI 37.03 kg/m  , BMI Body mass index is 37.03 kg/m. GEN: Well  nourished, well developed, in no acute distress  HEENT: normal  Neck: no JVD, carotid bruits, or masses Cardiac: RRR; no murmurs, rubs, or gallops,no edema  Respiratory:  clear to auscultation bilaterally, normal work of breathing GI: soft, nontender, nondistended, + BS MS: no deformity or atrophy  Skin: warm and dry, no rash Neuro:  Strength and sensation are intact Psych: euthymic mood, full affect Vascular: Femoral pulses +1 on the right and +2 on the left.  Left radial pulses normal.     EKG:  EKG is not ordered today.  Recent Labs: 01/23/2019: TSH 2.076 02/15/2019: ALT 43; B Natriuretic Peptide 52.4; Magnesium 1.9 04/17/2019: BUN 26; Creatinine, Ser 1.06; Hemoglobin 14.9; Platelets 253; Potassium 4.7; Sodium 137    Lipid Panel    Component Value Date/Time   CHOL 112 01/23/2019 0515   CHOL 126 01/17/2019 1009   TRIG 68 01/23/2019 0515   HDL 29 (L) 01/23/2019 0515   HDL 45 01/17/2019 1009   CHOLHDL 3.9 01/23/2019 0515   VLDL 14 01/23/2019 0515   LDLCALC 69 01/23/2019 0515   LDLCALC 68 01/17/2019 1009      Wt Readings from Last 3 Encounters:  05/06/19 265 lb 8 oz (120.4 kg)  04/17/19 257 lb (116.6 kg)  02/25/19 245 lb (111.1 kg)       No flowsheet data found.    ASSESSMENT AND PLAN:  1.  Peripheral arterial disease with severe right leg claudication Rutherford class III: Moderately reduced ABI with suspected inflow disease.  No improvement in symptoms with medical therapy and attempted walking program.  Due to that, I recommend proceeding with abdominal aortogram with lower extremity runoff and possible endovascular intervention.  Hold Eliquis 2 days before the procedure.  I discussed the procedure in details as well as risks and benefits.  Planned access is via the left common femoral artery.  2.  Coronary artery disease involving native coronary arteries without angina: Status post angioplasty of the ostial left circumflex in October.  High risk for  restenosis.  Currently on Plavix monotherapy given that he is on long-term anticoagulation with Eliquis.    3.  Chronic systolic heart failure: He appears to be euvolemic and currently on optimal medical therapy.  4.  Hyperlipidemia: Continue treatment with atorvastatin and Zetia with a target LDL of less than 70.  5.  Paroxysmal atrial fibrillation: He appears to be in sinus rhythm.  He is currently on anticoagulation with Eliquis.   Disposition:   Schedule angiogram next week and follow-up in 1 month  Signed,  [name], MD  05/06/2019 8:32 AM    [place]

## 2019-05-06 NOTE — H&P (View-Only) (Signed)
Cardiology Office Note   Date:  05/06/2019   ID:  [name], DOB [birth year], MRN [id number]  PCP:  No primary care provider on file.  Cardiologist:  Dr. Stanford Breed  No chief complaint on file.     History of Present Illness: Billy [name] is a 54 y.o. male who is here today for follow-up visit regarding peripheral arterial disease.  The patient had previous cardiac arrest in November 2010.  He had previous CABG.  He had an ICD placement done.Other medical problems include hypertension, previous tobacco use , PAD and hyperlipidemia.  He had previous left SFA stent many years ago.   He is being followed by me for severe right calf claudication with moderately reduced ABI in the 0.6 range.  He has left SFA stent that is known to be patent.  He is suspected of having inflow disease on the right side given very diminished right common femoral artery pulse and monophasic waveforms starting in the right common femoral artery. He had unstable angina in October 2020.  Cardiac catheterization showed severe native 2-vessel coronary artery disease.  SVG to RCA and SVG to OM 2 were known to be occluded.  LIMA to LAD was found to be atretic with competitive flow due to minimal disease in the native LAD.  SVG to first diagonal was patent.  There was 80% ostial left circumflex stenosis which was treated with scoring balloon angioplasty.   The patient had brief atrial fibrillation in November and was started on anticoagulation with Eliquis.  Brilinta was switched to Plavix and aspirin was discontinued. He denies any chest pain, shortness of breath or palpitations at the present time.  He continues to be limited with severe right calf claudication.  No rest pain.  Past Medical History:  Diagnosis Date  . Aortic atherosclerosis ([place])    on CXR  . Blood in stool   . C. difficile colitis    a. remote hx 2010.  . Cardiac arrest - ventricular fibrillation 02/11/2009   a. 02/2009 s/p St Jude ICD.  [name]  Chronic systolic CHF (congestive heart failure) ([place])   . Coronary artery disease    a. PCI to Cx age 26. b. CABGx4 in 2003. c. BMS to [place] in 12/2007.  [name] Former tobacco use   . Hyperlipidemia   . Hypertension   . Ischemic cardiomyopathy   . Marijuana abuse   . Myocardial infarct ([name])    NON-ST-SEGMENT ELEVATION MI  . Obesity   . PAD (peripheral artery disease) ([place])    a. RLE by noninvasive testing - managed medically for now.  . Ventricular tachyarrhythmia ([place])   . Ventricular tachycardia [facility])     Past Surgical History:  Procedure Laterality Date  . CARDIAC DEFIBRILLATOR PLACEMENT     STJ single chamber ICD implanted for secondary prevention  . CIRCUMCISION    . CORONARY ARTERY BYPASS GRAFT  06/17/01   X 4  . CORONARY BALLOON ANGIOPLASTY N/A 01/22/2019   Procedure: CORONARY BALLOON ANGIOPLASTY;  Surgeon: Leonie Man, MD;  Location: [place] [facility];  Service: Cardiovascular;  Laterality: N/A;  . LEFT HEART CATH AND CORS/GRAFTS ANGIOGRAPHY N/A 01/22/2019   Procedure: LEFT HEART CATH AND CORS/GRAFTS ANGIOGRAPHY;  Surgeon: Leonie Man, MD;  Location: [place] [facility];  Service: Cardiovascular;  Laterality: N/A;     Current Outpatient Medications  Medication Sig Dispense Refill  . apixaban (ELIQUIS) 5 MG TABS tablet Take 1 tablet (5 mg total) by mouth 2 (two)  times daily. 180 tablet 3  . atorvastatin (LIPITOR) 80 MG tablet TAKE 1 TABLET BY MOUTH EVERY DAY (Patient taking differently: Take 80 mg by mouth at bedtime. ) 90 tablet 3  . carvedilol (COREG) 25 MG tablet Take 1 tablet (25 mg total) by mouth 2 (two) times daily. 180 tablet 3  . clopidogrel (PLAVIX) 75 MG tablet Take 1 tablet (75 mg total) by mouth daily. 90 tablet 3  . dicyclomine (BENTYL) 20 MG tablet TAKE 1 TABLET BY MOUTH TWICE A DAY (Patient taking differently: Take 20 mg by mouth 2 (two) times daily as needed for spasms. ) 180 tablet 1  . ezetimibe (ZETIA) 10 MG tablet Take 1 tablet (10 mg total) by  mouth daily. 90 tablet 3  . furosemide (LASIX) 20 MG tablet Take 1 tablet (20 mg total) by mouth daily. 90 tablet 3  . isosorbide mononitrate (IMDUR) 30 MG 24 hr tablet Take 2 tablets (60 mg total) by mouth daily. 180 tablet 3  . lisinopril-hydrochlorothiazide (ZESTORETIC) 20-25 MG tablet Take 1 tablet by mouth daily. 90 tablet 3  . montelukast (SINGULAIR) 10 MG tablet Take 1 tablet (10 mg total) by mouth at bedtime. (Patient taking differently: Take 10 mg by mouth daily. ) 90 tablet 1  . Multiple Vitamin (MULTIVITAMIN WITH MINERALS) TABS tablet Take 1 tablet by mouth daily.    . nitroGLYCERIN (NITROSTAT) 0.4 MG SL tablet Place 1 tablet (0.4 mg total) under the tongue every 5 (five) minutes as needed for chest pain. 25 tablet 3  . omeprazole (PRILOSEC) 40 MG capsule Take 1 capsule (40 mg total) by mouth daily. 90 capsule 3  . ondansetron (ZOFRAN) 4 MG tablet Take 1 tablet (4 mg total) by mouth every 8 (eight) hours as needed for nausea or vomiting. 30 tablet 1  . potassium chloride SA (KLOR-CON M20) 20 MEQ tablet Take 1 tablet (20 mEq total) by mouth daily. 90 tablet 3   No current facility-administered medications for this visit.    Allergies:   Patient has no known allergies.    Social History:  The patient  reports that he quit smoking about 16 years ago. He has a 40.00 pack-year smoking history. He has never used smokeless tobacco. He reports that he does not drink alcohol or use drugs.   Family History:  The patient's family history includes Heart attack in his father; Heart disease in his sister; Hypertension in his mother and another family member.    ROS:  Please see the history of present illness.   Otherwise, review of systems are positive for none.   All other systems are reviewed and negative.    PHYSICAL EXAM: VS:  BP (!) 143/72   Temp (!) 97 F (36.1 C)   Ht 5\' 11"  (1.803 m)   Wt 265 lb 8 oz (120.4 kg)   BMI 37.03 kg/m  , BMI Body mass index is 37.03 kg/m. GEN: Well  nourished, well developed, in no acute distress  HEENT: normal  Neck: no JVD, carotid bruits, or masses Cardiac: RRR; no murmurs, rubs, or gallops,no edema  Respiratory:  clear to auscultation bilaterally, normal work of breathing GI: soft, nontender, nondistended, + BS MS: no deformity or atrophy  Skin: warm and dry, no rash Neuro:  Strength and sensation are intact Psych: euthymic mood, full affect Vascular: Femoral pulses +1 on the right and +2 on the left.  Left radial pulses normal.     EKG:  EKG is not ordered today.  Recent Labs: 01/23/2019: TSH 2.076 02/15/2019: ALT 43; B Natriuretic Peptide 52.4; Magnesium 1.9 04/17/2019: BUN 26; Creatinine, Ser 1.06; Hemoglobin 14.9; Platelets 253; Potassium 4.7; Sodium 137    Lipid Panel    Component Value Date/Time   CHOL 112 01/23/2019 0515   CHOL 126 01/17/2019 1009   TRIG 68 01/23/2019 0515   HDL 29 (L) 01/23/2019 0515   HDL 45 01/17/2019 1009   CHOLHDL 3.9 01/23/2019 0515   VLDL 14 01/23/2019 0515   LDLCALC 69 01/23/2019 0515   LDLCALC 68 01/17/2019 1009      Wt Readings from Last 3 Encounters:  05/06/19 265 lb 8 oz (120.4 kg)  04/17/19 257 lb (116.6 kg)  02/25/19 245 lb (111.1 kg)       No flowsheet data found.    ASSESSMENT AND PLAN:  1.  Peripheral arterial disease with severe right leg claudication Rutherford class III: Moderately reduced ABI with suspected inflow disease.  No improvement in symptoms with medical therapy and attempted walking program.  Due to that, I recommend proceeding with abdominal aortogram with lower extremity runoff and possible endovascular intervention.  Hold Eliquis 2 days before the procedure.  I discussed the procedure in details as well as risks and benefits.  Planned access is via the left common femoral artery.  2.  Coronary artery disease involving native coronary arteries without angina: Status post angioplasty of the ostial left circumflex in October.  High risk for  restenosis.  Currently on Plavix monotherapy given that he is on long-term anticoagulation with Eliquis.    3.  Chronic systolic heart failure: He appears to be euvolemic and currently on optimal medical therapy.  4.  Hyperlipidemia: Continue treatment with atorvastatin and Zetia with a target LDL of less than 70.  5.  Paroxysmal atrial fibrillation: He appears to be in sinus rhythm.  He is currently on anticoagulation with Eliquis.   Disposition:   Schedule angiogram next week and follow-up in 1 month  Signed,  [name], MD  05/06/2019 8:32 AM    [place]

## 2019-05-10 ENCOUNTER — Other Ambulatory Visit (HOSPITAL_COMMUNITY)
Admission: RE | Admit: 2019-05-10 | Discharge: 2019-05-10 | Disposition: A | Discharge status: Home / Self Care | Payer: Medicare Other | Source: Ambulatory Visit | Attending: Cardiovascular Disease | Admitting: Cardiovascular Disease

## 2019-05-10 DIAGNOSIS — Z20822 Contact with and (suspected) exposure to covid-19: Secondary | ICD-10-CM | POA: Insufficient documentation

## 2019-05-10 DIAGNOSIS — Z01812 Encounter for preprocedural laboratory examination: Secondary | ICD-10-CM | POA: Diagnosis not present

## 2019-05-10 LAB — SARS CORONAVIRUS 2 (TAT 6-24 HRS): SARS Coronavirus 2: NEGATIVE

## 2019-05-12 ENCOUNTER — Telehealth: Payer: Self-pay | Admitting: *Deleted

## 2019-05-12 NOTE — Telephone Encounter (Addendum)
Pt contacted pre abdominal aortogram scheduled at [facility] for: Wednesday May 14, 2019 8:30 AM Verified arrival time and place: [facility]) at: 6:30 AM   No solid food after midnight prior to cath, clear liquids until 5 AM day of procedure. Contrast allergy: no  Hold: Eliquis-none 05/12/19 until post procedure. Lasix/KCl-AM of procedure. Lisinopril-HCT-AM of procedure  Except hold medications AM meds can be  taken pre-cath with sip of water including: ASA 81 mg Plavix 75 mg  Confirmed patient has responsible adult to drive home post procedure and observe 24 hours after arriving home:   Currently, due to Covid-19 pandemic, only one person will be allowed with patient. Must be the same person for patient's entire stay and will be required to wear a mask. They will be asked to wait in the waiting room for the duration of the patient's stay.  Patients are required to wear a mask when they enter the hospital.  [facility] to review procedure instructions with patient.

## 2019-05-13 ENCOUNTER — Other Ambulatory Visit: Payer: Self-pay | Admitting: Cardiology

## 2019-05-13 NOTE — Telephone Encounter (Signed)
LMTCB to review procedure instructions with patient. 

## 2019-05-13 NOTE — Telephone Encounter (Signed)
Voicemail message, no answer. 

## 2019-05-13 NOTE — Telephone Encounter (Signed)
Rx has been sent to the pharmacy electronically. ° °

## 2019-05-14 ENCOUNTER — Encounter (HOSPITAL_COMMUNITY)
Admission: RE | Disposition: A | Discharge status: Home / Self Care | Payer: Self-pay | Source: Home / Self Care | Attending: Cardiovascular Disease

## 2019-05-14 ENCOUNTER — Ambulatory Visit (HOSPITAL_COMMUNITY)
Admission: RE | Admit: 2019-05-14 | Discharge: 2019-05-14 | Disposition: A | Discharge status: Home / Self Care | Payer: Medicare Other | Attending: Cardiovascular Disease | Admitting: Cardiovascular Disease

## 2019-05-14 ENCOUNTER — Other Ambulatory Visit: Payer: Self-pay | Admitting: *Deleted

## 2019-05-14 ENCOUNTER — Other Ambulatory Visit: Payer: Self-pay

## 2019-05-14 DIAGNOSIS — I110 Hypertensive heart disease with heart failure: Secondary | ICD-10-CM | POA: Diagnosis not present

## 2019-05-14 DIAGNOSIS — Z95810 Presence of automatic (implantable) cardiac defibrillator: Secondary | ICD-10-CM | POA: Insufficient documentation

## 2019-05-14 DIAGNOSIS — I472 Ventricular tachycardia: Secondary | ICD-10-CM | POA: Diagnosis not present

## 2019-05-14 DIAGNOSIS — I700 Atherosclerosis of aorta: Secondary | ICD-10-CM | POA: Diagnosis not present

## 2019-05-14 DIAGNOSIS — Z951 Presence of aortocoronary bypass graft: Secondary | ICD-10-CM | POA: Diagnosis not present

## 2019-05-14 DIAGNOSIS — Z7902 Long term (current) use of antithrombotics/antiplatelets: Secondary | ICD-10-CM | POA: Insufficient documentation

## 2019-05-14 DIAGNOSIS — Z7901 Long term (current) use of anticoagulants: Secondary | ICD-10-CM | POA: Insufficient documentation

## 2019-05-14 DIAGNOSIS — Z87891 Personal history of nicotine dependence: Secondary | ICD-10-CM | POA: Diagnosis not present

## 2019-05-14 DIAGNOSIS — E785 Hyperlipidemia, unspecified: Secondary | ICD-10-CM | POA: Diagnosis not present

## 2019-05-14 DIAGNOSIS — Z79899 Other long term (current) drug therapy: Secondary | ICD-10-CM | POA: Diagnosis not present

## 2019-05-14 DIAGNOSIS — I252 Old myocardial infarction: Secondary | ICD-10-CM | POA: Insufficient documentation

## 2019-05-14 DIAGNOSIS — I2510 Atherosclerotic heart disease of native coronary artery without angina pectoris: Secondary | ICD-10-CM | POA: Insufficient documentation

## 2019-05-14 DIAGNOSIS — I480 Paroxysmal atrial fibrillation: Secondary | ICD-10-CM | POA: Insufficient documentation

## 2019-05-14 DIAGNOSIS — E669 Obesity, unspecified: Secondary | ICD-10-CM | POA: Diagnosis not present

## 2019-05-14 DIAGNOSIS — I255 Ischemic cardiomyopathy: Secondary | ICD-10-CM | POA: Insufficient documentation

## 2019-05-14 DIAGNOSIS — Z8674 Personal history of sudden cardiac arrest: Secondary | ICD-10-CM | POA: Diagnosis not present

## 2019-05-14 DIAGNOSIS — I5022 Chronic systolic (congestive) heart failure: Secondary | ICD-10-CM | POA: Insufficient documentation

## 2019-05-14 DIAGNOSIS — I739 Peripheral vascular disease, unspecified: Secondary | ICD-10-CM | POA: Diagnosis not present

## 2019-05-14 DIAGNOSIS — I70211 Atherosclerosis of native arteries of extremities with intermittent claudication, right leg: Secondary | ICD-10-CM | POA: Diagnosis not present

## 2019-05-14 DIAGNOSIS — Z6836 Body mass index (BMI) 36.0-36.9, adult: Secondary | ICD-10-CM | POA: Insufficient documentation

## 2019-05-14 HISTORY — PX: PERIPHERAL VASCULAR INTERVENTION: CATH118257

## 2019-05-14 HISTORY — PX: ABDOMINAL AORTOGRAM W/LOWER EXTREMITY: CATH118223

## 2019-05-14 LAB — POCT ACTIVATED CLOTTING TIME: Activated Clotting Time: 268 seconds

## 2019-05-14 SURGERY — ABDOMINAL AORTOGRAM W/LOWER EXTREMITY
Anesthesia: LOCAL | Laterality: Right

## 2019-05-14 MED ORDER — HEPARIN (PORCINE) IN NACL 1000-0.9 UT/500ML-% IV SOLN
INTRAVENOUS | Status: DC | PRN
  Administered 2019-05-14: 500 mL

## 2019-05-14 MED ORDER — HEPARIN SODIUM (PORCINE) 1000 UNIT/ML IJ SOLN
INTRAMUSCULAR | Status: DC | PRN
  Administered 2019-05-14: 9000 [IU] via INTRAVENOUS

## 2019-05-14 MED ORDER — HYDRALAZINE HCL 20 MG/ML IJ SOLN: 5.0000 mg | INTRAMUSCULAR | Status: DC | PRN

## 2019-05-14 MED ORDER — HEPARIN SODIUM (PORCINE) 1000 UNIT/ML IJ SOLN
INTRAMUSCULAR | Status: AC
  Filled 2019-05-14: qty 1

## 2019-05-14 MED ORDER — ASPIRIN 81 MG PO CHEW
81.0000 mg | CHEWABLE_TABLET | ORAL | Status: AC
  Administered 2019-05-14: 81 mg via ORAL
  Filled 2019-05-14: qty 1

## 2019-05-14 MED ORDER — SODIUM CHLORIDE 0.9% FLUSH: 3.0000 mL | INTRAVENOUS | Status: DC | PRN

## 2019-05-14 MED ORDER — MIDAZOLAM HCL 2 MG/2ML IJ SOLN
INTRAMUSCULAR | Status: AC
  Filled 2019-05-14: qty 2

## 2019-05-14 MED ORDER — FENTANYL CITRATE (PF) 100 MCG/2ML IJ SOLN
INTRAMUSCULAR | Status: AC
  Filled 2019-05-14: qty 2

## 2019-05-14 MED ORDER — SODIUM CHLORIDE 0.9 % IV SOLN: INTRAVENOUS | Status: DC

## 2019-05-14 MED ORDER — SODIUM CHLORIDE 0.9 % IV SOLN: 250.0000 mL | INTRAVENOUS | Status: DC | PRN

## 2019-05-14 MED ORDER — CLOPIDOGREL BISULFATE 300 MG PO TABS
ORAL_TABLET | ORAL | Status: AC
  Filled 2019-05-14: qty 1

## 2019-05-14 MED ORDER — LIDOCAINE HCL (PF) 1 % IJ SOLN
INTRAMUSCULAR | Status: AC
  Filled 2019-05-14: qty 30

## 2019-05-14 MED ORDER — ONDANSETRON HCL 4 MG/2ML IJ SOLN: 4.0000 mg | Freq: Four times a day (QID) | INTRAMUSCULAR | Status: DC | PRN

## 2019-05-14 MED ORDER — CLOPIDOGREL BISULFATE 300 MG PO TABS
ORAL_TABLET | ORAL | Status: DC | PRN
  Administered 2019-05-14: 300 mg via ORAL

## 2019-05-14 MED ORDER — LIDOCAINE HCL (PF) 1 % IJ SOLN
INTRAMUSCULAR | Status: DC | PRN
  Administered 2019-05-14: 30 mL

## 2019-05-14 MED ORDER — SODIUM CHLORIDE 0.9% FLUSH: 3.0000 mL | Freq: Two times a day (BID) | INTRAVENOUS | Status: DC

## 2019-05-14 MED ORDER — MIDAZOLAM HCL 2 MG/2ML IJ SOLN
INTRAMUSCULAR | Status: DC | PRN
  Administered 2019-05-14 (×2): 1 mg via INTRAVENOUS

## 2019-05-14 MED ORDER — ACETAMINOPHEN 325 MG PO TABS: 650.0000 mg | ORAL_TABLET | ORAL | Status: DC | PRN

## 2019-05-14 MED ORDER — FENTANYL CITRATE (PF) 100 MCG/2ML IJ SOLN
INTRAMUSCULAR | Status: DC | PRN
  Administered 2019-05-14 (×3): 50 ug via INTRAVENOUS

## 2019-05-14 MED ORDER — SODIUM CHLORIDE 0.9 % IV SOLN: INTRAVENOUS | Status: AC

## 2019-05-14 MED ORDER — HEPARIN (PORCINE) IN NACL 1000-0.9 UT/500ML-% IV SOLN
INTRAVENOUS | Status: AC
  Filled 2019-05-14: qty 1000

## 2019-05-14 MED ORDER — IODIXANOL 320 MG/ML IV SOLN
INTRAVENOUS | Status: DC | PRN
  Administered 2019-05-14: 10:00:00 110 mL via INTRA_ARTERIAL

## 2019-05-14 SURGICAL SUPPLY — 23 items
BALLN MUSTANG 7.0X20 75 (BALLOONS) ×3
BALLN MUSTANG 8.0X40 75 (BALLOONS) ×3
BALLOON MUSTANG 7.0X20 75 (BALLOONS) ×2 IMPLANT
BALLOON MUSTANG 8.0X40 75 (BALLOONS) ×2 IMPLANT
CATH ANGIO 5F PIGTAIL 65CM (CATHETERS) ×3 IMPLANT
CATH CROSS OVER TEMPO 5F (CATHETERS) ×3 IMPLANT
CATH STRAIGHT 5FR 65CM (CATHETERS) ×3 IMPLANT
CLOSURE MYNX CONTROL 6F/7F (Vascular Products) ×3 IMPLANT
KIT ENCORE 26 ADVANTAGE (KITS) ×3 IMPLANT
KIT MICROPUNCTURE NIT STIFF (SHEATH) ×3 IMPLANT
KIT PV (KITS) ×3 IMPLANT
SHEATH PINNACLE 5F 10CM (SHEATH) ×3 IMPLANT
SHEATH PINNACLE 6F 10CM (SHEATH) ×3 IMPLANT
SHEATH PINNACLE ST 6F 45CM (SHEATH) ×3 IMPLANT
SHEATH PROBE COVER 6X72 (BAG) ×3 IMPLANT
STENT EPIC VASCULAR 9X40X75 (Permanent Stent) ×3 IMPLANT
STOPCOCK MORSE 400PSI 3WAY (MISCELLANEOUS) ×3 IMPLANT
SYR MEDRAD MARK 7 150ML (SYRINGE) ×3 IMPLANT
TRANSDUCER W/STOPCOCK (MISCELLANEOUS) ×3 IMPLANT
TRAY PV CATH (CUSTOM PROCEDURE TRAY) ×3 IMPLANT
TUBING CIL FLEX 10 FLL-RA (TUBING) ×3 IMPLANT
WIRE HITORQ VERSACORE ST 145CM (WIRE) ×3 IMPLANT
WIRE VERSACORE LOC 115CM (WIRE) ×3 IMPLANT

## 2019-05-14 NOTE — Interval H&P Note (Signed)
History and Physical Interval Note:  05/14/2019 8:51 AM  Billy [name]  has presented today for surgery, with the diagnosis of pad.  The various methods of treatment have been discussed with the patient and family. After consideration of risks, benefits and other options for treatment, the patient has consented to  Procedure(s): ABDOMINAL AORTOGRAM W/LOWER EXTREMITY (N/A) as a surgical intervention.  The patient's history has been reviewed, patient examined, no change in status, stable for surgery.  I have reviewed the patient's chart and labs.  Questions were answered to the patient's satisfaction.     [name]

## 2019-05-14 NOTE — Progress Notes (Signed)
Pt ambulated to restroom without assistance.

## 2019-05-14 NOTE — Telephone Encounter (Signed)
Pt currently admitted to hospital for procedure. 

## 2019-05-14 NOTE — Discharge Instructions (Signed)
Resume Eloquis tomorrow as long as there is no bleeding from the left groin.  Femoral Site Care This sheet gives you information about how to care for yourself after your procedure. Your health care provider may also give you more specific instructions. If you have problems or questions, contact your health care provider. What can I expect after the procedure? After the procedure, it is common to have:  Bruising that usually fades within 1-2 weeks.  Tenderness at the site. Follow these instructions at home: Wound care  Follow instructions from your health care provider about how to take care of your insertion site. Make sure you: ? Wash your hands with soap and water before you change your bandage (dressing). If soap and water are not available, use hand sanitizer. ? Change your dressing as told by your health care provider. ? Leave stitches (sutures), skin glue, or adhesive strips in place. These skin closures may need to stay in place for 2 weeks or longer. If adhesive strip edges start to loosen and curl up, you may trim the loose edges. Do not remove adhesive strips completely unless your health care provider tells you to do that.  Do not take baths, swim, or use a hot tub until your health care provider approves.  You may shower 24-48 hours after the procedure or as told by your health care provider. ? Gently wash the site with plain soap and water. ? Pat the area dry with a clean towel. ? Do not rub the site. This may cause bleeding.  Do not apply powder or lotion to the site. Keep the site clean and dry.  Check your femoral site every day for signs of infection. Check for: ? Redness, swelling, or pain. ? Fluid or blood. ? Warmth. ? Pus or a bad smell. Activity  For the first 2-3 days after your procedure, or as long as directed: ? Avoid climbing stairs as much as possible. ? Do not squat.  Do not lift anything that is heavier than 10 lb (4.5 kg), or the limit that you are  told, until your health care provider says that it is safe.  Rest as directed. ? Avoid sitting for a long time without moving. Get up to take short walks every 1-2 hours.  Do not drive for 24 hours if you were given a medicine to help you relax (sedative). General instructions  Take over-the-counter and prescription medicines only as told by your health care provider.  Keep all follow-up visits as told by your health care provider. This is important. Contact a health care provider if you have:  A fever or chills.  You have redness, swelling, or pain around your insertion site. Get help right away if:  The catheter insertion area swells very fast.  You pass out.  You suddenly start to sweat or your skin gets clammy.  The catheter insertion area is bleeding, and the bleeding does not stop when you hold steady pressure on the area.  The area near or just beyond the catheter insertion site becomes pale, cool, tingly, or numb. These symptoms may represent a serious problem that is an emergency. Do not wait to see if the symptoms will go away. Get medical help right away. Call your local emergency services (911 in the [place] Do not drive yourself to the hospital. Summary  After the procedure, it is common to have bruising that usually fades within 1-2 weeks.  Check your femoral site every day for signs of infection.  Do not lift anything that is heavier than 10 lb (4.5 kg), or the limit that you are told, until your health care provider says that it is safe. This information is not intended to replace advice given to you by your health care provider. Make sure you discuss any questions you have with your health care provider. Document Revised: 04/02/2017 Document Reviewed: 04/02/2017 Elsevier Patient Education  2020 [employer].

## 2019-05-23 ENCOUNTER — Other Ambulatory Visit: Payer: Self-pay | Admitting: Cardiology

## 2019-05-23 MED ORDER — PANTOPRAZOLE SODIUM 40 MG PO TBEC: 40.0000 mg | DELAYED_RELEASE_TABLET | Freq: Every day | ORAL | 3 refills | Status: DC

## 2019-05-23 NOTE — Telephone Encounter (Signed)
Called patient to let him know that I refilled his Protonix.

## 2019-05-23 NOTE — Telephone Encounter (Signed)
New Message   *STAT* If patient is at the pharmacy, call can be transferred to refill team.   1. Which medications need to be refilled? (please list name of each medication and dose if known) pantoprazole (PROTONIX) 40 MG tablet  2. Which pharmacy/location (including street and city if local pharmacy) is medication to be sent to? [id number] - [place], [place] [address]  3. Do they need a 30 day or 90 day supply? 90 day

## 2019-05-27 ENCOUNTER — Encounter (HOSPITAL_COMMUNITY): Payer: Medicare Other

## 2019-05-27 ENCOUNTER — Ambulatory Visit (HOSPITAL_COMMUNITY): Payer: Medicare Other

## 2019-05-28 ENCOUNTER — Ambulatory Visit (INDEPENDENT_AMBULATORY_CARE_PROVIDER_SITE_OTHER): Payer: Medicare Other | Admitting: *Deleted

## 2019-05-28 DIAGNOSIS — I255 Ischemic cardiomyopathy: Secondary | ICD-10-CM

## 2019-05-28 LAB — CUP PACEART REMOTE DEVICE CHECK
Battery Remaining Longevity: 30 mo
Battery Remaining Percentage: 25 %
Battery Voltage: 2.81 V
Brady Statistic RV Percent Paced: 1 %
Date Time Interrogation Session: 20210224032748
HighPow Impedance: 60 Ohm
Implantable Lead Implant Date: 20101111
Implantable Lead Location: 753860
Implantable Lead Model: 7121
Implantable Pulse Generator Implant Date: 20101111
Lead Channel Impedance Value: 550 Ohm
Lead Channel Pacing Threshold Amplitude: 1 V
Lead Channel Pacing Threshold Pulse Width: 0.5 ms
Lead Channel Sensing Intrinsic Amplitude: 12 mV
Lead Channel Setting Pacing Amplitude: 2.5 V
Lead Channel Setting Pacing Pulse Width: 0.5 ms
Lead Channel Setting Sensing Sensitivity: 0.5 mV
Pulse Gen Serial Number: 743367

## 2019-05-28 NOTE — Progress Notes (Signed)
ICD Remote  

## 2019-06-03 ENCOUNTER — Ambulatory Visit (HOSPITAL_COMMUNITY)
Admission: RE | Admit: 2019-06-03 | Discharge: 2019-06-03 | Disposition: A | Discharge status: Home / Self Care | Payer: Medicare Other | Source: Ambulatory Visit | Attending: Cardiology | Admitting: Cardiology

## 2019-06-03 ENCOUNTER — Ambulatory Visit: Payer: Medicare Other | Admitting: Cardiovascular Disease

## 2019-06-03 ENCOUNTER — Other Ambulatory Visit: Payer: Self-pay

## 2019-06-03 ENCOUNTER — Ambulatory Visit (HOSPITAL_BASED_OUTPATIENT_CLINIC_OR_DEPARTMENT_OTHER)
Admission: RE | Admit: 2019-06-03 | Discharge: 2019-06-03 | Disposition: A | Discharge status: Home / Self Care | Payer: Medicare Other | Source: Ambulatory Visit | Attending: Cardiology | Admitting: Cardiology

## 2019-06-03 ENCOUNTER — Other Ambulatory Visit (HOSPITAL_COMMUNITY): Payer: Self-pay | Admitting: Cardiovascular Disease

## 2019-06-03 DIAGNOSIS — I739 Peripheral vascular disease, unspecified: Secondary | ICD-10-CM | POA: Diagnosis not present

## 2019-06-03 DIAGNOSIS — Z95828 Presence of other vascular implants and grafts: Secondary | ICD-10-CM | POA: Insufficient documentation

## 2019-06-10 ENCOUNTER — Other Ambulatory Visit: Payer: Self-pay | Admitting: Cardiology

## 2019-06-11 ENCOUNTER — Encounter: Payer: Self-pay | Admitting: Nurse Practitioner

## 2019-06-11 ENCOUNTER — Ambulatory Visit (INDEPENDENT_AMBULATORY_CARE_PROVIDER_SITE_OTHER): Payer: Medicare Other | Admitting: Internal Medicine

## 2019-06-11 DIAGNOSIS — Z1211 Encounter for screening for malignant neoplasm of colon: Secondary | ICD-10-CM | POA: Diagnosis not present

## 2019-06-11 DIAGNOSIS — I252 Old myocardial infarction: Secondary | ICD-10-CM | POA: Diagnosis not present

## 2019-06-11 DIAGNOSIS — J302 Other seasonal allergic rhinitis: Secondary | ICD-10-CM | POA: Diagnosis not present

## 2019-06-11 DIAGNOSIS — I2510 Atherosclerotic heart disease of native coronary artery without angina pectoris: Secondary | ICD-10-CM

## 2019-06-11 DIAGNOSIS — Z7689 Persons encountering health services in other specified circumstances: Secondary | ICD-10-CM | POA: Diagnosis not present

## 2019-06-11 DIAGNOSIS — Z95810 Presence of automatic (implantable) cardiac defibrillator: Secondary | ICD-10-CM | POA: Diagnosis not present

## 2019-06-11 DIAGNOSIS — I10 Essential (primary) hypertension: Secondary | ICD-10-CM | POA: Diagnosis not present

## 2019-06-11 DIAGNOSIS — Z951 Presence of aortocoronary bypass graft: Secondary | ICD-10-CM

## 2019-06-11 MED ORDER — FLUTICASONE PROPIONATE 50 MCG/ACT NA SUSP: 2.0000 | Freq: Every day | NASAL | 11 refills | Status: DC | PRN

## 2019-06-11 MED ORDER — MONTELUKAST SODIUM 10 MG PO TABS: 10.0000 mg | ORAL_TABLET | Freq: Every day | ORAL | 1 refills | Status: DC | PRN

## 2019-06-11 NOTE — Patient Instructions (Signed)
Thank you for choosing Primary Care at [facility] to be your medical home!    Viktor Rosita Fire was seen by Melina Schools, DO today.   Pharrell Silbaugh's primary care provider is Phill Myron, DO.   For the best care possible, you should try to see Phill Myron, DO whenever you come to the clinic.   We look forward to seeing you again soon!  If you have any questions about your visit today, please call us at [phone] or feel free to reach your primary care provider via [facility].

## 2019-06-11 NOTE — Progress Notes (Signed)
Virtual Visit via Telephone Note  I connected with Billy [name], on 06/11/2019 at 10:31 AM by telephone due to the COVID-19 pandemic and verified that I am speaking with the correct person using two identifiers.   Consent: I discussed the limitations, risks, security and privacy concerns of performing an evaluation and management service by telephone and the availability of in person appointments. I also discussed with the patient that there may be a patient responsible charge related to this service. The patient expressed understanding and agreed to proceed.   Location of Patient: Home   Location of Provider: Clinic    Persons participating in Telemedicine visit: Billy [name] Billy [name] Dr. Juleen China      History of Present Illness: Patient has a visit to establish care. Reports has not had a PCP for awhile. Patient has a significant cardiac history with history of CAD, MI, quadruple vessel bypass, ICD in place . Followed by a cardiologist. They prescribe his cardiac medications.    Past Medical History:  Diagnosis Date  . Aortic atherosclerosis ([place])    on CXR  . Blood in stool   . C. difficile colitis    a. remote hx 2010.  . Cardiac arrest - ventricular fibrillation 02/11/2009   a. 02/2009 s/p St Jude ICD.  [name] Chronic systolic CHF (congestive heart failure) ([place])   . Coronary artery disease    a. PCI to Cx age 86. b. CABGx4 in 2003. c. BMS to [place] in 12/2007.  [name] Former tobacco use   . Hyperlipidemia   . Hypertension   . Ischemic cardiomyopathy   . Marijuana abuse   . Myocardial infarct ([name])    NON-ST-SEGMENT ELEVATION MI  . Obesity   . PAD (peripheral artery disease) ([place])    a. RLE by noninvasive testing - managed medically for now.  . Ventricular tachyarrhythmia ([place])   . Ventricular tachycardia ([name])    No Known Allergies  Current Outpatient Medications on File Prior to Visit  Medication Sig Dispense Refill  . montelukast (SINGULAIR) 10 MG tablet  Take 1 tablet (10 mg total) by mouth at bedtime. (Patient taking differently: Take 10 mg by mouth daily as needed (allergies). ) 90 tablet 1  . acetaminophen (TYLENOL) 500 MG tablet Take 1,000 mg by mouth every 6 (six) hours as needed for moderate pain or headache.    [name] apixaban (ELIQUIS) 5 MG TABS tablet Take 1 tablet (5 mg total) by mouth 2 (two) times daily. 180 tablet 3  . atorvastatin (LIPITOR) 80 MG tablet TAKE 1 TABLET BY MOUTH EVERY DAY (Patient taking differently: Take 80 mg by mouth at bedtime. ) 90 tablet 3  . carvedilol (COREG) 25 MG tablet Take 1 tablet (25 mg total) by mouth 2 (two) times daily. 180 tablet 3  . clopidogrel (PLAVIX) 75 MG tablet Take 1 tablet (75 mg total) by mouth daily. 90 tablet 3  . ezetimibe (ZETIA) 10 MG tablet Take 1 tablet (10 mg total) by mouth daily. 90 tablet 3  . fluticasone (FLONASE) 50 MCG/ACT nasal spray Place 1 spray into both nostrils daily as needed for allergies or rhinitis.    . furosemide (LASIX) 20 MG tablet Take 1 tablet (20 mg total) by mouth daily. 90 tablet 3  . isosorbide mononitrate (IMDUR) 30 MG 24 hr tablet Take 2 tablets (60 mg total) by mouth daily. (Patient taking differently: Take 30 mg by mouth 2 (two) times daily. ) 180 tablet 3  . lisinopril-hydrochlorothiazide (ZESTORETIC) 20-25 MG tablet Take 1  tablet by mouth daily. 90 tablet 3  . Multiple Vitamin (MULTIVITAMIN WITH MINERALS) TABS tablet Take 1 tablet by mouth daily.    . nitroGLYCERIN (NITROSTAT) 0.4 MG SL tablet Place 1 tablet (0.4 mg total) under the tongue every 5 (five) minutes as needed for chest pain. 25 tablet 1  . pantoprazole (PROTONIX) 40 MG tablet Take 1 tablet (40 mg total) by mouth daily. 90 tablet 3  . potassium chloride SA (KLOR-CON M20) 20 MEQ tablet Take 1 tablet (20 mEq total) by mouth daily. 90 tablet 3   No current facility-administered medications on file prior to visit.    Observations/Objective: NAD. Speaking clearly.  Work of breathing normal.  Alert  and oriented. Mood appropriate.   Assessment and Plan: 1. Encounter to establish care  2. Screening for colon cancer - Ambulatory referral to Gastroenterology  3. Seasonal allergies - montelukast (SINGULAIR) 10 MG tablet; Take 1 tablet (10 mg total) by mouth daily as needed (allergies).  Dispense: 90 tablet; Refill: 1 - fluticasone (FLONASE) 50 MCG/ACT nasal spray; Place 2 sprays into both nostrils daily as needed for allergies or rhinitis.  Dispense: 16 g; Refill: 11   Follow Up Instructions: Follow up for annual exam    I discussed the assessment and treatment plan with the patient. The patient was provided an opportunity to ask questions and all were answered. The patient agreed with the plan and demonstrated an understanding of the instructions.   The patient was advised to call back or seek an in-person evaluation if the symptoms worsen or if the condition fails to improve as anticipated.     I provided 10 minutes total of non-face-to-face time during this encounter including median intraservice time, reviewing previous notes, investigations, ordering medications, medical decision making, coordinating care and patient verbalized understanding at the end of the visit.    Phill Myron, D.O. Primary Care at [facility]  06/11/2019, 10:31 AM

## 2019-06-17 ENCOUNTER — Ambulatory Visit: Payer: Medicare Other | Admitting: Cardiovascular Disease

## 2019-06-18 ENCOUNTER — Other Ambulatory Visit: Payer: Self-pay | Admitting: Cardiology

## 2019-06-18 DIAGNOSIS — I10 Essential (primary) hypertension: Secondary | ICD-10-CM

## 2019-06-24 ENCOUNTER — Encounter: Payer: Self-pay | Admitting: Cardiovascular Disease

## 2019-06-24 ENCOUNTER — Ambulatory Visit (INDEPENDENT_AMBULATORY_CARE_PROVIDER_SITE_OTHER): Payer: Medicare Other | Admitting: Cardiovascular Disease

## 2019-06-24 ENCOUNTER — Other Ambulatory Visit: Payer: Self-pay

## 2019-06-24 VITALS — BP 128/82 | HR 80 | Temp 97.0°F | Ht 71.0 in | Wt 267.6 lb

## 2019-06-24 DIAGNOSIS — I480 Paroxysmal atrial fibrillation: Secondary | ICD-10-CM | POA: Diagnosis not present

## 2019-06-24 DIAGNOSIS — I739 Peripheral vascular disease, unspecified: Secondary | ICD-10-CM

## 2019-06-24 DIAGNOSIS — I2510 Atherosclerotic heart disease of native coronary artery without angina pectoris: Secondary | ICD-10-CM | POA: Diagnosis not present

## 2019-06-24 DIAGNOSIS — I5022 Chronic systolic (congestive) heart failure: Secondary | ICD-10-CM | POA: Diagnosis not present

## 2019-06-24 DIAGNOSIS — I255 Ischemic cardiomyopathy: Secondary | ICD-10-CM

## 2019-06-24 DIAGNOSIS — E785 Hyperlipidemia, unspecified: Secondary | ICD-10-CM | POA: Diagnosis not present

## 2019-06-24 NOTE — Patient Instructions (Signed)
Medication Instructions:  No changes *If you need a refill on your cardiac medications before your next appointment, please call your pharmacy*   Lab Work: None ordered If you have labs (blood work) drawn today and your tests are completely normal, you will receive your results only by: [name] MyChart Message (if you have MyChart) OR . A paper copy in the mail If you have any lab test that is abnormal or we need to change your treatment, we will call you to review the results.   Testing/Procedures: None ordered   Follow-Up: At [facility], you and your health needs are our priority.  As part of our continuing mission to provide you with exceptional heart care, we have created designated Provider Care Teams.  These Care Teams include your primary Cardiologist (physician) and Advanced Practice Providers (APPs -  Physician Assistants and Nurse Practitioners) who all work together to provide you with the care you need, when you need it.  We recommend signing up for the patient portal called "MyChart".  Sign up information is provided on this After Visit Summary.  MyChart is used to connect with patients for Virtual Visits (Telemedicine).  Patients are able to view lab/test results, encounter notes, upcoming appointments, etc.  Non-urgent messages can be sent to your provider as well.   To learn more about what you can do with MyChart, go to [email or web address].    Your next appointment:   3 month(s)  The format for your next appointment:   In Person  Provider:   Dr. Fletcher Anon

## 2019-06-24 NOTE — Progress Notes (Signed)
Cardiology Office Note   Date:  06/24/2019   ID:  Billy [name], DOB 1965-09-30, MRN [id number]  PCP:  Billy Bang, DO  Cardiologist:  Dr. Stanford Breed  No chief complaint on file.     History of Present Illness: Billy [name] is a 54 y.o. male who is here today for follow-up visit regarding peripheral arterial disease.  The patient had previous cardiac arrest in November 2010.  He had previous CABG.  He had an ICD placement done.Other medical problems include hypertension, previous tobacco use , PAD and hyperlipidemia.  He had previous left SFA stent many years ago.    He had unstable angina in October 2020.  Cardiac catheterization showed severe native 2-vessel coronary artery disease.  SVG to RCA and SVG to OM 2 were known to be occluded.  LIMA to LAD was found to be atretic with competitive flow due to minimal disease in the native LAD.  SVG to first diagonal was patent.  There was 80% ostial left circumflex stenosis which was treated with scoring balloon angioplasty.   The patient had brief atrial fibrillation in November and was started on anticoagulation with Eliquis.  Brilinta was switched to Plavix and aspirin was discontinued.  He was seen recently for severe right calf claudication with suspected inflow disease.  I proceeded with angiography last month which showed no significant aortic disease.  No significant iliac disease noted on the left side.  On the right side, there was severe stenosis affecting the proximal external iliac artery with occluded mid SFA with reconstitution distally via collaterals from the profunda.  I performed successful self-expanding stent placement to the right external iliac artery.  Postprocedure vascular studies showed patent right external iliac artery stent with ABI of 0.7 which is expected given his chronic occlusion of the right SFA.  Surprisingly, he reports only mild improvement in right calf claudication.  No rest pain or lower  extremity ulceration  Past Medical History:  Diagnosis Date  . Aortic atherosclerosis ([place])    on CXR  . Blood in stool   . C. difficile colitis    a. remote hx 2010.  . Cardiac arrest - ventricular fibrillation 02/11/2009   a. 02/2009 s/p St Jude ICD.  [name] Chronic systolic CHF (congestive heart failure) ([place])   . Coronary artery disease    a. PCI to Cx age 74. b. CABGx4 in 2003. c. BMS to [place] in 12/2007.  [name] Former tobacco use   . Hyperlipidemia   . Hypertension   . Ischemic cardiomyopathy   . Marijuana abuse   . Myocardial infarct ([name])    NON-ST-SEGMENT ELEVATION MI  . Obesity   . PAD (peripheral artery disease) ([place])    a. RLE by noninvasive testing - managed medically for now.  . Ventricular tachyarrhythmia ([place])   . Ventricular tachycardia [facility])     Past Surgical History:  Procedure Laterality Date  . ABDOMINAL AORTOGRAM W/LOWER EXTREMITY N/A 05/14/2019   Procedure: ABDOMINAL AORTOGRAM W/LOWER EXTREMITY;  Surgeon: Wellington Hampshire, MD;  Location: [place] [facility];  Service: Cardiovascular;  Laterality: N/A;  . CARDIAC DEFIBRILLATOR PLACEMENT     STJ single chamber ICD implanted for secondary prevention  . CIRCUMCISION    . CORONARY ARTERY BYPASS GRAFT  06/17/01   X 4  . CORONARY BALLOON ANGIOPLASTY N/A 01/22/2019   Procedure: CORONARY BALLOON ANGIOPLASTY;  Surgeon: Leonie Man, MD;  Location: [place] [facility];  Service: Cardiovascular;  Laterality: N/A;  . LEFT  HEART CATH AND CORS/GRAFTS ANGIOGRAPHY N/A 01/22/2019   Procedure: LEFT HEART CATH AND CORS/GRAFTS ANGIOGRAPHY;  Surgeon: Leonie Man, MD;  Location: [place] [facility];  Service: Cardiovascular;  Laterality: N/A;  . PERIPHERAL VASCULAR INTERVENTION Right 05/14/2019   Procedure: PERIPHERAL VASCULAR INTERVENTION;  Surgeon: Wellington Hampshire, MD;  Location: [place] [facility];  Service: Cardiovascular;  Laterality: Right;     Current Outpatient Medications  Medication Sig Dispense Refill  .  acetaminophen (TYLENOL) 500 MG tablet Take 1,000 mg by mouth every 6 (six) hours as needed for moderate pain or headache.    [name] apixaban (ELIQUIS) 5 MG TABS tablet Take 1 tablet (5 mg total) by mouth 2 (two) times daily. 180 tablet 3  . atorvastatin (LIPITOR) 80 MG tablet TAKE 1 TABLET BY MOUTH EVERY DAY (Patient taking differently: Take 80 mg by mouth at bedtime. ) 90 tablet 3  . carvedilol (COREG) 25 MG tablet Take 1 tablet (25 mg total) by mouth 2 (two) times daily. 180 tablet 3  . clopidogrel (PLAVIX) 75 MG tablet Take 1 tablet (75 mg total) by mouth daily. 90 tablet 3  . ezetimibe (ZETIA) 10 MG tablet Take 1 tablet (10 mg total) by mouth daily. 90 tablet 3  . fluticasone (FLONASE) 50 MCG/ACT nasal spray Place 2 sprays into both nostrils daily as needed for allergies or rhinitis. 16 g 11  . furosemide (LASIX) 20 MG tablet Take 1 tablet (20 mg total) by mouth daily. 90 tablet 3  . isosorbide mononitrate (IMDUR) 30 MG 24 hr tablet TAKE 1 TABLET BY MOUTH EVERY DAY 90 tablet 3  . lisinopril-hydrochlorothiazide (ZESTORETIC) 20-25 MG tablet Take 1 tablet by mouth daily. 90 tablet 3  . montelukast (SINGULAIR) 10 MG tablet Take 1 tablet (10 mg total) by mouth daily as needed (allergies). 90 tablet 1  . Multiple Vitamin (MULTIVITAMIN WITH MINERALS) TABS tablet Take 1 tablet by mouth daily.    . nitroGLYCERIN (NITROSTAT) 0.4 MG SL tablet Place 1 tablet (0.4 mg total) under the tongue every 5 (five) minutes as needed for chest pain. 25 tablet 4  . pantoprazole (PROTONIX) 40 MG tablet Take 1 tablet (40 mg total) by mouth daily. 90 tablet 3  . potassium chloride SA (KLOR-CON M20) 20 MEQ tablet Take 1 tablet (20 mEq total) by mouth daily. 90 tablet 3   No current facility-administered medications for this visit.    Allergies:   Patient has no known allergies.    Social History:  The patient  reports that he quit smoking about 16 years ago. He has a 40.00 pack-year smoking history. He has never used  smokeless tobacco. He reports that he does not drink alcohol or use drugs.   Family History:  The patient's family history includes Heart attack in his father; Heart disease in his sister; Hypertension in his mother and another family member.    ROS:  Please see the history of present illness.   Otherwise, review of systems are positive for none.   All other systems are reviewed and negative.    PHYSICAL EXAM: VS:  BP 128/82   Pulse 80   Temp (!) 97 F (36.1 C)   Ht 5\' 11"  (1.803 m)   Wt 267 lb 9.6 oz (121.4 kg)   SpO2 97%   BMI 37.32 kg/m  , BMI Body mass index is 37.32 kg/m. GEN: Well nourished, well developed, in no acute distress  HEENT: normal  Neck: no JVD, carotid bruits, or masses Cardiac:  RRR; no murmurs, rubs, or gallops,no edema  Respiratory:  clear to auscultation bilaterally, normal work of breathing GI: soft, nontender, nondistended, + BS MS: no deformity or atrophy  Skin: warm and dry, no rash Neuro:  Strength and sensation are intact Psych: euthymic mood, full affect Vascular: Femoral pulses +2 bilaterally with no groin hematoma   EKG:  EKG is not ordered today.   Recent Labs: 01/23/2019: TSH 2.076 02/15/2019: ALT 43; B Natriuretic Peptide 52.4; Magnesium 1.9 05/06/2019: BUN 23; Creatinine, Ser 1.26; Hemoglobin 15.6; Platelets 262; Potassium 4.7; Sodium 138    Lipid Panel    Component Value Date/Time   CHOL 112 01/23/2019 0515   CHOL 126 01/17/2019 1009   TRIG 68 01/23/2019 0515   HDL 29 (L) 01/23/2019 0515   HDL 45 01/17/2019 1009   CHOLHDL 3.9 01/23/2019 0515   VLDL 14 01/23/2019 0515   LDLCALC 69 01/23/2019 0515   LDLCALC 68 01/17/2019 1009      Wt Readings from Last 3 Encounters:  06/24/19 267 lb 9.6 oz (121.4 kg)  05/14/19 260 lb (117.9 kg)  05/06/19 265 lb 8 oz (120.4 kg)       No flowsheet data found.    ASSESSMENT AND PLAN:  1.  Peripheral arterial disease right lower extremity claudication: Status post successful stent  placement to the right external iliac artery with excellent results.  However, he continues to have an occluded right SFA which is likely responsible for his residual right calf claudication.  I discussed with him the importance of starting an exercise program and he is going to do that for at least 3 months.  If he continues to be significantly limited by this, the next step is to proceed with repeat angiography with plans to treat the occluded right SFA.    2.  Coronary artery disease involving native coronary arteries without angina: Status post angioplasty of the ostial left circumflex in October.  High risk for restenosis.  Currently on Plavix monotherapy given that he is on long-term anticoagulation with Eliquis.    3.  Chronic systolic heart failure: He appears to be euvolemic and currently on optimal medical therapy.  4.  Hyperlipidemia: Continue treatment with atorvastatin and Zetia with a target LDL of less than 70.  5.  Paroxysmal atrial fibrillation: He appears to be in sinus rhythm.  He is currently on anticoagulation with Eliquis.   Disposition:   Start a walking exercise program and follow-up with me in 3 months.  Signed,  [name], MD  06/24/2019 9:02 AM    [place]

## 2019-06-30 ENCOUNTER — Other Ambulatory Visit: Payer: Self-pay

## 2019-06-30 ENCOUNTER — Ambulatory Visit (INDEPENDENT_AMBULATORY_CARE_PROVIDER_SITE_OTHER): Payer: Medicare Other | Admitting: Internal Medicine

## 2019-06-30 ENCOUNTER — Encounter: Payer: Self-pay | Admitting: Internal Medicine

## 2019-06-30 VITALS — BP 100/60 | HR 75 | Ht 71.0 in | Wt 272.8 lb

## 2019-06-30 DIAGNOSIS — I4901 Ventricular fibrillation: Secondary | ICD-10-CM | POA: Diagnosis not present

## 2019-06-30 DIAGNOSIS — I4891 Unspecified atrial fibrillation: Secondary | ICD-10-CM

## 2019-06-30 DIAGNOSIS — I255 Ischemic cardiomyopathy: Secondary | ICD-10-CM | POA: Diagnosis not present

## 2019-06-30 DIAGNOSIS — Z95810 Presence of automatic (implantable) cardiac defibrillator: Secondary | ICD-10-CM | POA: Diagnosis not present

## 2019-06-30 LAB — CUP PACEART INCLINIC DEVICE CHECK
Battery Remaining Longevity: 27 mo
Brady Statistic RV Percent Paced: 0.02 %
Date Time Interrogation Session: 20210329124146
HighPow Impedance: 59 Ohm
Implantable Lead Implant Date: 20101111
Implantable Lead Location: 753860
Implantable Lead Model: 7121
Implantable Pulse Generator Implant Date: 20101111
Lead Channel Impedance Value: 537.5 Ohm
Lead Channel Pacing Threshold Amplitude: 0.75 V
Lead Channel Pacing Threshold Amplitude: 0.75 V
Lead Channel Pacing Threshold Pulse Width: 0.5 ms
Lead Channel Pacing Threshold Pulse Width: 0.5 ms
Lead Channel Sensing Intrinsic Amplitude: 12 mV
Lead Channel Setting Pacing Amplitude: 2.5 V
Lead Channel Setting Pacing Pulse Width: 0.5 ms
Lead Channel Setting Sensing Sensitivity: 0.5 mV
Pulse Gen Serial Number: 743367

## 2019-06-30 MED ORDER — BISOPROLOL FUMARATE 5 MG PO TABS: 2.5000 mg | ORAL_TABLET | Freq: Every day | ORAL | 3 refills | Status: DC

## 2019-06-30 MED ORDER — LISINOPRIL-HYDROCHLOROTHIAZIDE 20-25 MG PO TABS: 0.5000 | ORAL_TABLET | Freq: Every day | ORAL | 3 refills | Status: DC

## 2019-06-30 NOTE — Progress Notes (Signed)
Patient Care Team: Nicolette Bang, DO as PCP - General (Family Medicine) Stanford Breed Denice Bors, MD as PCP - Cardiology (Cardiology)   HPI  Bejamin Hankins is a 54 y.o. male Seen in followup for an ICD implanted following a cardiac arrest fall 2010. His history of ischemic heart disease and prior bypass grafting and had declined ICD implantation prior to his arrest.   10/16 he had intercurrent ICD shock-appropriate VF 10/20 chest pain 11/20  Developed atrial fib during hospitalization for chest pain 1/21 SVT accelerated into VT with ATP requiring ICD shock  DATE TEST EF   12/09 cMRI  35 % Large infarct  5/19 MYOVIEW  30 % Large infarct  10/20 LHC 35-40% LIMA-T; LAD-min SVG-RCA-T;SVG-OM2-T; SVG-D1 40; Cx-stent-patent; Cx-80>>40 (angio) RCAp-70, RCA m-85/100%  10/20 Echo 50-55% InferoApical HK     Date Cr K Hgb  7/17 1.17 4.2 15.2  4/19 1.03 3.9 16.7  2/21 1.26 4.7 15.6   The patient denies chest pain, shortness of breath, nocturnal dyspnea, orthopnea or peripheral edema.  There have been no palpitations, lightheadedness or syncope.    His wife is scheduled to undergo her third heart surgery in the middle of April.  Anxious  Past Medical History:  Diagnosis Date  . Aortic atherosclerosis ([place])    on CXR  . Blood in stool   . C. difficile colitis    a. remote hx 2010.  . Cardiac arrest - ventricular fibrillation 02/11/2009   a. 02/2009 s/p St Jude ICD.  [name] Chronic systolic CHF (congestive heart failure) ([place])   . Coronary artery disease    a. PCI to Cx age 24. b. CABGx4 in 2003. c. BMS to [place] in 12/2007.  [name] Former tobacco use   . Hyperlipidemia   . Hypertension   . Ischemic cardiomyopathy   . Marijuana abuse   . Myocardial infarct ([name])    NON-ST-SEGMENT ELEVATION MI  . Obesity   . PAD (peripheral artery disease) ([place])    a. RLE by noninvasive testing - managed medically for now.  . Ventricular tachyarrhythmia ([place])   . Ventricular tachycardia  [facility])     Past Surgical History:  Procedure Laterality Date  . ABDOMINAL AORTOGRAM W/LOWER EXTREMITY N/A 05/14/2019   Procedure: ABDOMINAL AORTOGRAM W/LOWER EXTREMITY;  Surgeon: Wellington Hampshire, MD;  Location: [place] [facility];  Service: Cardiovascular;  Laterality: N/A;  . CARDIAC DEFIBRILLATOR PLACEMENT     STJ single chamber ICD implanted for secondary prevention  . CIRCUMCISION    . CORONARY ARTERY BYPASS GRAFT  06/17/01   X 4  . CORONARY BALLOON ANGIOPLASTY N/A 01/22/2019   Procedure: CORONARY BALLOON ANGIOPLASTY;  Surgeon: Leonie Man, MD;  Location: [place] [facility];  Service: Cardiovascular;  Laterality: N/A;  . LEFT HEART CATH AND CORS/GRAFTS ANGIOGRAPHY N/A 01/22/2019   Procedure: LEFT HEART CATH AND CORS/GRAFTS ANGIOGRAPHY;  Surgeon: Leonie Man, MD;  Location: [place] [facility];  Service: Cardiovascular;  Laterality: N/A;  . PERIPHERAL VASCULAR INTERVENTION Right 05/14/2019   Procedure: PERIPHERAL VASCULAR INTERVENTION;  Surgeon: Wellington Hampshire, MD;  Location: [place] [facility];  Service: Cardiovascular;  Laterality: Right;    Current Outpatient Medications  Medication Sig Dispense Refill  . acetaminophen (TYLENOL) 500 MG tablet Take 1,000 mg by mouth every 6 (six) hours as needed for moderate pain or headache.    [name] apixaban (ELIQUIS) 5 MG TABS tablet Take 1 tablet (5 mg total) by mouth 2 (two) times  daily. 180 tablet 3  . atorvastatin (LIPITOR) 80 MG tablet TAKE 1 TABLET BY MOUTH EVERY DAY 90 tablet 3  . carvedilol (COREG) 25 MG tablet Take 1 tablet (25 mg total) by mouth 2 (two) times daily. 180 tablet 3  . clopidogrel (PLAVIX) 75 MG tablet Take 1 tablet (75 mg total) by mouth daily. 90 tablet 3  . ezetimibe (ZETIA) 10 MG tablet Take 1 tablet (10 mg total) by mouth daily. 90 tablet 3  . fluticasone (FLONASE) 50 MCG/ACT nasal spray Place 2 sprays into both nostrils daily as needed for allergies or rhinitis. 16 g 11  . furosemide (LASIX) 20 MG tablet Take 1  tablet (20 mg total) by mouth daily. 90 tablet 3  . isosorbide mononitrate (IMDUR) 30 MG 24 hr tablet TAKE 1 TABLET BY MOUTH EVERY DAY 90 tablet 3  . lisinopril-hydrochlorothiazide (ZESTORETIC) 20-25 MG tablet Take 1 tablet by mouth daily. 90 tablet 3  . montelukast (SINGULAIR) 10 MG tablet Take 1 tablet (10 mg total) by mouth daily as needed (allergies). 90 tablet 1  . Multiple Vitamin (MULTIVITAMIN WITH MINERALS) TABS tablet Take 1 tablet by mouth daily.    . nitroGLYCERIN (NITROSTAT) 0.4 MG SL tablet Place 1 tablet (0.4 mg total) under the tongue every 5 (five) minutes as needed for chest pain. 25 tablet 4  . pantoprazole (PROTONIX) 40 MG tablet Take 1 tablet (40 mg total) by mouth daily. 90 tablet 3  . potassium chloride SA (KLOR-CON M20) 20 MEQ tablet Take 1 tablet (20 mEq total) by mouth daily. 90 tablet 3   No current facility-administered medications for this visit.    No Known Allergies  Review of Systems negative except from HPI and PMH  Physical Exam BP 100/60   Pulse 75   Ht 5\' 11"  (1.803 m)   Wt 272 lb 12.8 oz (123.7 kg)   SpO2 97%   BMI 38.05 kg/m  Well developed and nourished in no acute distress Well developed and well nourished in no acute distress HENT normal Neck supple with JVP-flat Clear Device pocket well healed; without hematoma or erythema.  There is no tethering  Regular rate and rhythm, no    murmur Abd-soft with active BS No Clubbing cyanosis  edema Skin-warm and dry A & Oriented  Grossly normal sensory and motor function  ECG sinus at 75 13/09/39  Assessment and  Plan  Aborted cardiac arrest     Ischemic heart disease with prior bypass surgery recent angioplasty  Hyperlipidemia  Defibrillator-St. Jude   Intercurrent therapy SVT-inappropriate with acceleration   The patient underwent intervention for progressive atherosclerosis.  Managed by Dr. London Sheer.  No intercurrent chest pain.  Euvolemic without heart failure.  1/21 while raking leaves  developed a rapid tachycardia.  It appears to be sinus tachycardia giving rise to an SVT which was then inappropriately diagnosed and treated with ATP accelerating to VT requiring shock.  We will add a second beta-blocker low-dose bisoprolol to his carvedilol.  As his blood pressure is relatively low we will cut his Zestoretic in half from 20/25--10/12.5.  pwp

## 2019-06-30 NOTE — Patient Instructions (Signed)
Medication Instructions:  Your physician has recommended you make the following change in your medication:  Begin taking Zestoretic 20-25mg  -  1/2 tablet by mouth daily  Begin taking Bisoprolol 2.5mg  -  1/2 tablet by mouth daily  Labwork: None ordered.  Testing/Procedures: None ordered.  Follow-Up: Your physician wants you to follow-up in: 12 months with Dr Caryl Comes. You will receive a reminder letter in the mail two months in advance. If you don't receive a letter, please call our office to schedule the follow-up appointment.  Remote monitoring is used to monitor your Pacemaker of ICD from home. This monitoring reduces the number of office visits required to check your device to one time per year. It allows [place] to keep an eye on the functioning of your device to ensure it is working properly.   Any Other Special Instructions Will Be Listed Below (If Applicable).  If you need a refill on your cardiac medications before your next appointment, please call your pharmacy.

## 2019-07-08 NOTE — Telephone Encounter (Signed)
Addressed at 06/30/19 appointment with Dr. Caryl Comes.

## 2019-07-10 ENCOUNTER — Telehealth: Payer: Self-pay | Admitting: Cardiology

## 2019-07-10 NOTE — Telephone Encounter (Signed)
I spoke to the patient's wife who called because the patient is still having severe bouts of acid reflux on Protonix 40 mg.  He takes the medication in the morning, daily, but by afternoon after eating dinner, the food backs up and acid reflux occurs.    He was on Omeprazole in the past, but had to change that after starting Eliquis.  He would like some advisement.

## 2019-07-10 NOTE — Telephone Encounter (Signed)
Would ask his primary care for advise for gerd Billy [name]

## 2019-07-10 NOTE — Telephone Encounter (Signed)
I spoke to the patient's wife with Dr Jacalyn Lefevre advisement.  She verbalized understanding.

## 2019-07-10 NOTE — Telephone Encounter (Signed)
New message   Patient's wife states that pantoprazole (PROTONIX) 40 MG tablet that the medicine is not strong enough for the acid reflux. Please call to discuss.

## 2019-07-28 ENCOUNTER — Telehealth: Payer: Self-pay

## 2019-07-28 NOTE — Telephone Encounter (Signed)
Called patient to do their pre-visit COVID screening.  Call went to voicemail. Unable to do prescreening.  

## 2019-07-29 ENCOUNTER — Ambulatory Visit: Payer: Medicare Other | Admitting: Nurse Practitioner

## 2019-07-29 ENCOUNTER — Ambulatory Visit: Payer: Medicare Other | Admitting: Internal Medicine

## 2019-08-27 ENCOUNTER — Ambulatory Visit (INDEPENDENT_AMBULATORY_CARE_PROVIDER_SITE_OTHER): Payer: Medicare Other | Admitting: *Deleted

## 2019-08-27 DIAGNOSIS — I4901 Ventricular fibrillation: Secondary | ICD-10-CM

## 2019-08-27 LAB — CUP PACEART REMOTE DEVICE CHECK
Battery Remaining Longevity: 28 mo
Battery Remaining Percentage: 24 %
Battery Voltage: 2.8 V
Brady Statistic RV Percent Paced: 1 %
Date Time Interrogation Session: 20210526025824
HighPow Impedance: 59 Ohm
Implantable Lead Implant Date: 20101111
Implantable Lead Location: 753860
Implantable Lead Model: 7121
Implantable Pulse Generator Implant Date: 20101111
Lead Channel Impedance Value: 510 Ohm
Lead Channel Pacing Threshold Amplitude: 0.75 V
Lead Channel Pacing Threshold Pulse Width: 0.5 ms
Lead Channel Sensing Intrinsic Amplitude: 12 mV
Lead Channel Setting Pacing Amplitude: 2.5 V
Lead Channel Setting Pacing Pulse Width: 0.5 ms
Lead Channel Setting Sensing Sensitivity: 0.5 mV
Pulse Gen Serial Number: 743367

## 2019-08-29 NOTE — Progress Notes (Signed)
Remote ICD transmission.   

## 2019-09-06 ENCOUNTER — Other Ambulatory Visit: Payer: Self-pay | Admitting: Cardiology

## 2019-09-08 NOTE — Telephone Encounter (Signed)
Rx request sent to pharmacy.  

## 2019-09-16 ENCOUNTER — Other Ambulatory Visit: Payer: Self-pay | Admitting: *Deleted

## 2019-09-16 ENCOUNTER — Telehealth: Payer: Self-pay | Admitting: *Deleted

## 2019-09-16 NOTE — Telephone Encounter (Signed)
[facility] alert transmission received 09/16/19 showing VT event in VF zone on 09/15/19 at 12:25pm, terminated with 650V shock. VT appears to have been preceded by SVT.  LMOVM for pt, direct number and office hours provided. Spoke with pt's wife, who agrees to have pt call back. She reports he is taking all of his medication. She states he did not make her aware of episode. Will await call back from pt.

## 2019-09-17 NOTE — Telephone Encounter (Signed)
Second attempt:  LMOVM for patient. Direct number and office hours provided.  Routed to Dr. Caryl Comes for review. Will continue trying to reach pt.

## 2019-09-18 NOTE — Telephone Encounter (Signed)
The pt was returning Taylor phone call. I let him speak with Amy, rn.

## 2019-09-18 NOTE — Telephone Encounter (Signed)
Spoke with patient and confirmed the medication changes that were made in March-  Start Bisoprolol 2.5mg  daily, cut Lisinopril- HCTZ dose in half (10-12.5mg ) to prevent BP ffrom going too low with new med.

## 2019-09-18 NOTE — Telephone Encounter (Signed)
Follow Up:     Pt says he need to talk to you about his medicine again.

## 2019-09-18 NOTE — Telephone Encounter (Signed)
Pt returned Emily's phone call.  States at time of episode he doesn't recall being symptomatic.  He thinks he felt shock but at time was not sure of what it was as he was changing his wife car tire at the time.  Pt reportedly feels good now, denies any cardiac symptoms.  D/w pt the meds he is supposed  To be taking, he states the pharmacy told him not to take Bisoprolol as it does not work with his other meds.   Reviewed records, Dr. Caryl Comes prescribed this medication at 06/30/19 following a treated VT episode.  Notes indicate "1/21 while raking leaves developed a rapid tachycardia.  It appears to be sinus tachycardia giving rise to an SVT which was then inappropriately diagnosed and treated with ATP accelerating to VT requiring shock.  We will add a second beta-blocker low-dose bisoprolol to his carvedilol.  As his blood pressure is relatively low we will cut his Zestoretic in half from 20/25--10/12.5."    Advised pt MD aware of other meds and prescribed medication specifically for thsi reason.  Pt will resume taking medication as prescribed today.  Noted that pharmacy should contact MD if there are concerns about med interactions.

## 2019-09-24 NOTE — Telephone Encounter (Signed)
M  [id number]    Recurrent shock for what appears to be afib >> VT, previously accelerated by ATP, on this occasion long shorting Could we 1) further increase bisoprolol 2.5>>5mg  daily 2) further decrease Lisinopril HCT by 50 %   3) arrange OV WOKO  Thanks SK

## 2019-09-25 MED ORDER — LISINOPRIL-HYDROCHLOROTHIAZIDE 10-12.5 MG PO TABS
0.5000 | ORAL_TABLET | Freq: Every day | ORAL | 3 refills | Status: DC
Start: 2019-09-25 — End: 2019-10-15

## 2019-09-25 MED ORDER — BISOPROLOL FUMARATE 5 MG PO TABS: 5.0000 mg | ORAL_TABLET | Freq: Every day | ORAL | 3 refills | Status: DC

## 2019-09-25 NOTE — Telephone Encounter (Signed)
Spoke with pt's wife, Hassan [facility]an [facility]) and advised of med changes per Dr Caryl Comes.  Rx's have been sent to pharmacy on file. For Bisoprolol 5mg  1 tablet by mouth daily and Lisinopril/HCTZ 10-12.5 take 1/2 tablet by mouth daily.  Will have Dr Olin Pia scheduler reach out to schedule appointment.  Pt's wife verbalizes understanding and agrees with current plan.

## 2019-09-29 NOTE — Telephone Encounter (Signed)
Pt is scheduled to see A. Tillery, PA-C on 10/15/2019.

## 2019-10-08 ENCOUNTER — Telehealth: Payer: Self-pay

## 2019-10-08 NOTE — Telephone Encounter (Signed)
Called patient to do their pre-visit COVID screening.  Call went to voicemail. Unable to do prescreening.  

## 2019-10-09 ENCOUNTER — Encounter: Payer: Self-pay | Admitting: Internal Medicine

## 2019-10-09 ENCOUNTER — Other Ambulatory Visit: Payer: Self-pay

## 2019-10-09 ENCOUNTER — Ambulatory Visit (INDEPENDENT_AMBULATORY_CARE_PROVIDER_SITE_OTHER): Payer: Medicare Other | Admitting: Internal Medicine

## 2019-10-09 VITALS — BP 112/72 | HR 62 | Temp 97.3°F | Resp 18 | Ht 70.0 in | Wt 260.0 lb

## 2019-10-09 DIAGNOSIS — Z125 Encounter for screening for malignant neoplasm of prostate: Secondary | ICD-10-CM | POA: Diagnosis not present

## 2019-10-09 DIAGNOSIS — L729 Follicular cyst of the skin and subcutaneous tissue, unspecified: Secondary | ICD-10-CM

## 2019-10-09 DIAGNOSIS — Z1159 Encounter for screening for other viral diseases: Secondary | ICD-10-CM

## 2019-10-09 DIAGNOSIS — J069 Acute upper respiratory infection, unspecified: Secondary | ICD-10-CM

## 2019-10-09 DIAGNOSIS — Z1211 Encounter for screening for malignant neoplasm of colon: Secondary | ICD-10-CM | POA: Diagnosis not present

## 2019-10-09 DIAGNOSIS — Z0000 Encounter for general adult medical examination without abnormal findings: Secondary | ICD-10-CM | POA: Diagnosis not present

## 2019-10-09 DIAGNOSIS — I10 Essential (primary) hypertension: Secondary | ICD-10-CM

## 2019-10-09 MED ORDER — BENZONATATE 100 MG PO CAPS: 100.0000 mg | ORAL_CAPSULE | Freq: Two times a day (BID) | ORAL | 0 refills | Status: DC | PRN

## 2019-10-09 NOTE — Progress Notes (Signed)
amb  Subjective:    Billy [name] - 54 y.o. male MRN [id number]  Date of birth: March 28, 1966  HPI  Billy [name] is here for annual exam.  Has concerns about URI type symptoms. About 2.5 weeks ago started to have nasal congestion, facial pressure, rhinorrhea. Then developed cough that is mostly dry and seems to be related to postnasal drip. Worse at nighttime. Symptoms have overall improved but cough has lingered. Tried some Mucinex but didn't feel like that helped much. Has been afebrile.       Health Maintenance:  Health Maintenance Due  Topic Date Due  . Hepatitis C Screening  Never done  . COVID-19 Vaccine (1) Never done  . COLONOSCOPY  Never done    -  reports that he quit smoking about 16 years ago. He has a 40.00 pack-year smoking history. He has never used smokeless tobacco. - Review of Systems: Per HPI. - Past Medical History: Patient Active Problem List   Diagnosis Date Noted  . New onset atrial fibrillation ([place])   . Status post angioplasty 02/15/2019  . Unstable angina ([place]) 01/22/2019  . Coronary artery disease involving native coronary artery of native heart with unstable angina pectoris ([place]) 01/22/2019  . Irritable bowel syndrome 08/23/2017  . Seasonal allergies 04/19/2017  . S/P quadruple vessel bypass 04/19/2017  . Prediabetes 04/19/2017  . GERD (gastroesophageal reflux disease) 01/31/2016  . Implantable cardioverter-defibrillator (ICD) in situ 06/12/2011  . VENTRICULAR FIBRILLATION 03/02/2009  . Cardiomyopathy, ischemic 08/25/2008  . Hyperlipidemia 01/15/2008  . Essential hypertension 01/15/2008  . CORONARY ARTERY DISEASE 01/15/2008  . PERIPHERAL VASCULAR DISEASE 01/15/2008  . HEMATOCHEZIA 01/15/2008  . CHEST PAIN 01/15/2008   - Medications: reviewed and updated   Objective:   Physical Exam BP 112/72   Pulse 62   Temp (!) 97.3 F (36.3 C) (Temporal)   Resp 18   Ht 5\' 10"  (1.778 m)   Wt 260 lb (117.9 kg)   SpO2 94%   BMI 37.31 kg/m   Physical Exam Constitutional:      Appearance: He is not diaphoretic.  HENT:     Head: Normocephalic and atraumatic.     Comments: No TTP over maxillary or frontal sinuses. Mobile mass approximately 3 cm diameter present on right frontal scalp.     Right Ear: Tympanic membrane and ear canal normal.     Left Ear: Tympanic membrane and ear canal normal.     Mouth/Throat:     Mouth: Mucous membranes are moist.     Pharynx: Posterior oropharyngeal erythema present. No oropharyngeal exudate.  Eyes:     Conjunctiva/sclera: Conjunctivae normal.     Pupils: Pupils are equal, round, and reactive to light.  Neck:     Thyroid: No thyromegaly.  Cardiovascular:     Rate and Rhythm: Normal rate and regular rhythm.     Heart sounds: Normal heart sounds. No murmur heard.   Pulmonary:     Effort: Pulmonary effort is normal. No respiratory distress.     Breath sounds: Normal breath sounds. No wheezing.  Abdominal:     General: Bowel sounds are normal. There is no distension.     Palpations: Abdomen is soft.     Tenderness: There is no abdominal tenderness. There is no guarding or rebound.  Musculoskeletal:        General: No deformity. Normal range of motion.     Cervical back: Normal range of motion and neck supple.  Lymphadenopathy:     Cervical: No cervical adenopathy.  Skin:    General: Skin is warm and dry.     Findings: No rash.  Neurological:     Mental Status: He is alert and oriented to person, place, and time.     Gait: Gait is intact.  Psychiatric:        Mood and Affect: Mood and affect normal.        Judgment: Judgment normal.            Assessment & Plan:   1. Annual physical exam Counseled on 150 minutes of exercise per week, healthy eating (including decreased daily intake of saturated fats, cholesterol, added sugars, sodium), STI prevention, routine healthcare maintenance. - Lipid Panel  2. Screening PSA (prostate specific antigen) - PSA  3. Need for  hepatitis C screening test - Hepatitis C Antibody  4. Colon cancer screening - Ambulatory referral to Gastroenterology  5. Scalp cyst Appears most consistent with benign sebaceous cyst. Bothersome in appearance for patient and sometimes aches; refer to derm for removal.  - Ambulatory referral to Dermatology  6. Viral URI with cough Symptoms and exam consistent with viral URI. Afebrile and with clear lung exam so low concern for PNA. Will treat cough with Tessalon. Supportive care options discussed.  - benzonatate (TESSALON) 100 MG capsule; Take 1 capsule (100 mg total) by mouth 2 (two) times daily as needed for cough.  Dispense: 20 capsule; Refill: 0  7. Essential hypertension BP is well controlled today. Continue current regimen.  - CBC with Differential - Comprehensive metabolic panel      Phill Myron, D.O. 10/09/2019, 9:36 AM Primary Care at [facility]

## 2019-10-10 LAB — LIPID PANEL
Chol/HDL Ratio: 3.7 ratio (ref 0.0–5.0)
Cholesterol, Total: 119 mg/dL (ref 100–199)
HDL: 32 mg/dL — ABNORMAL LOW (ref >39)
LDL Chol Calc (NIH): 74 mg/dL (ref 0–99)
Triglycerides: 59 mg/dL (ref 0–149)
VLDL Cholesterol Cal: 13 mg/dL (ref 5–40)

## 2019-10-10 LAB — PSA: Prostate Specific Ag, Serum: 0.4 ng/mL (ref 0.0–4.0)

## 2019-10-10 LAB — CBC WITH DIFFERENTIAL/PLATELET
Basophils Absolute: 0.1 10*3/uL (ref 0.0–0.2)
Basos: 1 %
EOS (ABSOLUTE): 0.3 10*3/uL (ref 0.0–0.4)
Eos: 3 %
Hematocrit: 47.2 % (ref 37.5–51.0)
Hemoglobin: 15.6 g/dL (ref 13.0–17.7)
Immature Grans (Abs): 0 10*3/uL (ref 0.0–0.1)
Immature Granulocytes: 0 %
Lymphocytes Absolute: 3.1 10*3/uL (ref 0.7–3.1)
Lymphs: 29 %
MCH: 26.4 pg — ABNORMAL LOW (ref 26.6–33.0)
MCHC: 33.1 g/dL (ref 31.5–35.7)
MCV: 80 fL (ref 79–97)
Monocytes Absolute: 0.9 10*3/uL (ref 0.1–0.9)
Monocytes: 9 %
Neutrophils Absolute: 6.2 10*3/uL (ref 1.4–7.0)
Neutrophils: 58 %
Platelets: 279 10*3/uL (ref 150–450)
RBC: 5.9 x10E6/uL — ABNORMAL HIGH (ref 4.14–5.80)
RDW: 16.1 % — ABNORMAL HIGH (ref 11.6–15.4)
WBC: 10.5 10*3/uL (ref 3.4–10.8)

## 2019-10-10 LAB — HEPATITIS C ANTIBODY: Hep C Virus Ab: <0.1 s/co ratio (ref 0.0–0.9)

## 2019-10-10 LAB — COMPREHENSIVE METABOLIC PANEL
ALT: 34 IU/L (ref 0–44)
AST: 27 IU/L (ref 0–40)
Albumin/Globulin Ratio: 1 — ABNORMAL LOW (ref 1.2–2.2)
Albumin: 3.9 g/dL (ref 3.8–4.9)
Alkaline Phosphatase: 125 IU/L — ABNORMAL HIGH (ref 48–121)
BUN/Creatinine Ratio: 13 (ref 9–20)
BUN: 15 mg/dL (ref 6–24)
Bilirubin Total: 0.6 mg/dL (ref 0.0–1.2)
CO2: 25 mmol/L (ref 20–29)
Calcium: 9.6 mg/dL (ref 8.7–10.2)
Chloride: 101 mmol/L (ref 96–106)
Creatinine, Ser: 1.2 mg/dL (ref 0.76–1.27)
GFR calc Af Amer: 79 mL/min/{1.73_m2} (ref >59)
GFR calc non Af Amer: 69 mL/min/{1.73_m2} (ref >59)
Globulin, Total: 4 g/dL (ref 1.5–4.5)
Glucose: 98 mg/dL (ref 65–99)
Potassium: 4.6 mmol/L (ref 3.5–5.2)
Sodium: 139 mmol/L (ref 134–144)
Total Protein: 7.9 g/dL (ref 6.0–8.5)

## 2019-10-11 DIAGNOSIS — Z23 Encounter for immunization: Secondary | ICD-10-CM | POA: Diagnosis not present

## 2019-10-14 ENCOUNTER — Ambulatory Visit (INDEPENDENT_AMBULATORY_CARE_PROVIDER_SITE_OTHER): Payer: Medicare Other | Admitting: Cardiovascular Disease

## 2019-10-14 ENCOUNTER — Encounter: Payer: Self-pay | Admitting: Cardiovascular Disease

## 2019-10-14 ENCOUNTER — Other Ambulatory Visit: Payer: Self-pay

## 2019-10-14 VITALS — BP 100/74 | HR 68 | Ht 71.0 in | Wt 261.0 lb

## 2019-10-14 DIAGNOSIS — E7800 Pure hypercholesterolemia, unspecified: Secondary | ICD-10-CM

## 2019-10-14 DIAGNOSIS — I2510 Atherosclerotic heart disease of native coronary artery without angina pectoris: Secondary | ICD-10-CM

## 2019-10-14 DIAGNOSIS — I739 Peripheral vascular disease, unspecified: Secondary | ICD-10-CM | POA: Diagnosis not present

## 2019-10-14 DIAGNOSIS — I5022 Chronic systolic (congestive) heart failure: Secondary | ICD-10-CM

## 2019-10-14 DIAGNOSIS — I255 Ischemic cardiomyopathy: Secondary | ICD-10-CM | POA: Diagnosis not present

## 2019-10-14 NOTE — Progress Notes (Signed)
Cardiology Office Note   Date:  10/14/2019   ID:  Billy [name], DOB Feb 05, 1966, MRN [id number]  PCP:  Nicolette Bang, DO  Cardiologist:  Dr. Stanford Breed  No chief complaint on file.     History of Present Illness: Billy [name] is a 54 y.o. male who is here today for follow-up visit regarding peripheral arterial disease.  The patient had previous cardiac arrest in November 2010.  He had previous CABG.  He had an ICD placement done.Other medical problems include hypertension, previous tobacco use , PAD and hyperlipidemia.  He had previous left SFA stent many years ago.    He had unstable angina in October 2020.  Cardiac catheterization showed severe native 2-vessel coronary artery disease.  SVG to RCA and SVG to OM 2 were known to be occluded.  LIMA to LAD was found to be atretic with competitive flow due to minimal disease in the native LAD.  SVG to first diagonal was patent.  There was 80% ostial left circumflex stenosis which was treated with scoring balloon angioplasty.   The patient had brief atrial fibrillation in November and was started on anticoagulation with Eliquis.  Brilinta was switched to Plavix and aspirin was discontinued.  He was seen recently for severe right calf claudication with suspected inflow disease.  I proceeded with angiography last month which showed no significant aortic disease.  No significant iliac disease noted on the left side.  On the right side, there was severe stenosis affecting the proximal external iliac artery with occluded mid SFA with reconstitution distally via collaterals from the profunda.  I performed successful self-expanding stent placement to the right external iliac artery.  Postprocedure vascular studies showed patent right external iliac artery stent with ABI of 0.7 which is expected given his chronic occlusion of the right SFA.  Initially, he reported no significant improvement in his right calf claudication.  However, he  started walking more and noticed gradual improvement in claudication.  He reports that his right calf claudication is currently mild and not lifestyle limiting.  He denies chest pain or shortness of breath.  He was found to have some tachycardia and a second beta-blocker was added by Dr. Caryl Comes.  Past Medical History:  Diagnosis Date  . Aortic atherosclerosis ([place])    on CXR  . Blood in stool   . C. difficile colitis    a. remote hx 2010.  . Cardiac arrest - ventricular fibrillation 02/11/2009   a. 02/2009 s/p St Jude ICD.  [name] Chronic systolic CHF (congestive heart failure) ([place])   . Coronary artery disease    a. PCI to Cx age 58. b. CABGx4 in 2003. c. BMS to [place] in 12/2007.  [name] Former tobacco use   . Hyperlipidemia   . Hypertension   . Ischemic cardiomyopathy   . Marijuana abuse   . Myocardial infarct ([name])    NON-ST-SEGMENT ELEVATION MI  . Obesity   . PAD (peripheral artery disease) ([place])    a. RLE by noninvasive testing - managed medically for now.  . Ventricular tachyarrhythmia ([place])   . Ventricular tachycardia [facility])     Past Surgical History:  Procedure Laterality Date  . ABDOMINAL AORTOGRAM W/LOWER EXTREMITY N/A 05/14/2019   Procedure: ABDOMINAL AORTOGRAM W/LOWER EXTREMITY;  Surgeon: Wellington Hampshire, MD;  Location: [place] [facility];  Service: Cardiovascular;  Laterality: N/A;  . CARDIAC DEFIBRILLATOR PLACEMENT     STJ single chamber ICD implanted for secondary prevention  . CIRCUMCISION    .  CORONARY ARTERY BYPASS GRAFT  06/17/01   X 4  . CORONARY BALLOON ANGIOPLASTY N/A 01/22/2019   Procedure: CORONARY BALLOON ANGIOPLASTY;  Surgeon: Leonie Man, MD;  Location: [place] [facility];  Service: Cardiovascular;  Laterality: N/A;  . LEFT HEART CATH AND CORS/GRAFTS ANGIOGRAPHY N/A 01/22/2019   Procedure: LEFT HEART CATH AND CORS/GRAFTS ANGIOGRAPHY;  Surgeon: Leonie Man, MD;  Location: [place] [facility];  Service: Cardiovascular;  Laterality: N/A;  . PERIPHERAL  VASCULAR INTERVENTION Right 05/14/2019   Procedure: PERIPHERAL VASCULAR INTERVENTION;  Surgeon: Wellington Hampshire, MD;  Location: [place] [facility];  Service: Cardiovascular;  Laterality: Right;     Current Outpatient Medications  Medication Sig Dispense Refill  . apixaban (ELIQUIS) 5 MG TABS tablet Take 1 tablet (5 mg total) by mouth 2 (two) times daily. 180 tablet 3  . atorvastatin (LIPITOR) 80 MG tablet TAKE 1 TABLET BY MOUTH EVERY DAY 90 tablet 3  . benzonatate (TESSALON) 100 MG capsule Take 1 capsule (100 mg total) by mouth 2 (two) times daily as needed for cough. 20 capsule 0  . bisoprolol (ZEBETA) 5 MG tablet Take 1 tablet (5 mg total) by mouth daily. 90 tablet 3  . carvedilol (COREG) 25 MG tablet Take 1 tablet (25 mg total) by mouth 2 (two) times daily. 180 tablet 3  . clopidogrel (PLAVIX) 75 MG tablet Take 1 tablet (75 mg total) by mouth daily. 90 tablet 3  . ezetimibe (ZETIA) 10 MG tablet Take 1 tablet (10 mg total) by mouth daily. 90 tablet 3  . fluticasone (FLONASE) 50 MCG/ACT nasal spray Place 2 sprays into both nostrils daily as needed for allergies or rhinitis. 16 g 11  . furosemide (LASIX) 20 MG tablet Take 1 tablet (20 mg total) by mouth daily. 90 tablet 3  . isosorbide mononitrate (IMDUR) 30 MG 24 hr tablet TAKE 1 TABLET BY MOUTH EVERY DAY 90 tablet 3  . lisinopril-hydrochlorothiazide (ZESTORETIC) 10-12.5 MG tablet Take 0.5 tablets by mouth daily. 45 tablet 3  . montelukast (SINGULAIR) 10 MG tablet Take 1 tablet (10 mg total) by mouth daily as needed (allergies). 90 tablet 1  . Multiple Vitamin (MULTIVITAMIN WITH MINERALS) TABS tablet Take 1 tablet by mouth daily.    . nitroGLYCERIN (NITROSTAT) 0.4 MG SL tablet PLACE 1 TABLET (0.4 MG TOTAL) UNDER THE TONGUE EVERY 5 (FIVE) MINUTES AS NEEDED FOR CHEST PAIN. 75 tablet 1  . pantoprazole (PROTONIX) 40 MG tablet Take 1 tablet (40 mg total) by mouth daily. 90 tablet 3  . potassium chloride SA (KLOR-CON M20) 20 MEQ tablet Take 1  tablet (20 mEq total) by mouth daily. 90 tablet 3   No current facility-administered medications for this visit.    Allergies:   Patient has no known allergies.    Social History:  The patient  reports that he quit smoking about 16 years ago. He has a 40.00 pack-year smoking history. He has never used smokeless tobacco. He reports that he does not drink alcohol and does not use drugs.   Family History:  The patient's family history includes Heart attack in his father; Heart disease in his sister; Hypertension in his mother and another family member.    ROS:  Please see the history of present illness.   Otherwise, review of systems are positive for none.   All other systems are reviewed and negative.    PHYSICAL EXAM: VS:  BP 100/74 (BP Location: Right Arm, Patient Position: Sitting, Cuff Size: Large)  Pulse 68   Ht 5\' 11"  (1.803 m)   Wt 261 lb (118.4 kg)   SpO2 96%   BMI 36.40 kg/m  , BMI Body mass index is 36.4 kg/m. GEN: Well nourished, well developed, in no acute distress  HEENT: normal  Neck: no JVD, carotid bruits, or masses Cardiac: RRR; no murmurs, rubs, or gallops,no edema  Respiratory:  clear to auscultation bilaterally, normal work of breathing GI: soft, nontender, nondistended, + BS MS: no deformity or atrophy  Skin: warm and dry, no rash Neuro:  Strength and sensation are intact Psych: euthymic mood, full affect    EKG:  EKG is not ordered today.   Recent Labs: 01/23/2019: TSH 2.076 02/15/2019: B Natriuretic Peptide 52.4; Magnesium 1.9 10/09/2019: ALT 34; BUN 15; Creatinine, Ser 1.20; Hemoglobin 15.6; Platelets 279; Potassium 4.6; Sodium 139    Lipid Panel    Component Value Date/Time   CHOL 119 10/09/2019 0959   TRIG 59 10/09/2019 0959   HDL 32 (L) 10/09/2019 0959   CHOLHDL 3.7 10/09/2019 0959   CHOLHDL 3.9 01/23/2019 0515   VLDL 14 01/23/2019 0515   LDLCALC 74 10/09/2019 0959      Wt Readings from Last 3 Encounters:  10/14/19 261 lb (118.4 kg)   10/09/19 260 lb (117.9 kg)  06/30/19 272 lb 12.8 oz (123.7 kg)       No flowsheet data found.    ASSESSMENT AND PLAN:  1.  Peripheral arterial disease right lower extremity claudication: Status post stent placement to the right external iliac artery with excellent results.  He has known occlusion of the right SFA which is responsible for residual right calf claudication.  He reports that his claudication gradually improved with walking and currently does not feel limited.  Thus, I recommend continuing medical therapy.  He is due for repeat aortoiliac duplex and ABI in September.   2.  Coronary artery disease involving native coronary arteries without angina: Status post angioplasty of the ostial left circumflex in October.  High risk for restenosis.  Currently on Plavix monotherapy given that he is on long-term anticoagulation with Eliquis.    3.  Chronic systolic heart failure: He appears to be euvolemic and currently on optimal medical therapy.  4.  Hyperlipidemia: Continue treatment with atorvastatin and Zetia with a target LDL of less than 70.  5.  Paroxysmal atrial fibrillation: He appears to be in sinus rhythm.  He is currently on anticoagulation with Eliquis.   Disposition: Follow-up with me in 6 months.  Signed,  [name], MD  10/14/2019 8:53 AM    [place]

## 2019-10-14 NOTE — Progress Notes (Signed)
Electrophysiology Office Note Date: 10/15/2019  ID:  Billy [name], DOB 11/11/1965, MRN [id number]  PCP: Nicolette Bang, DO Primary Cardiologist: Kirk Ruths, MD Electrophysiologist: Virl Axe, MD   CC: Routine ICD follow-up  Billy [name] is a 53 y.o. male seen today for Virl Axe, MD for routine electrophysiology followup due to shock for VT (preceded by SVT similar to previous episode) on 09/15/2019.  Since last being seen in our clinic the patient reports doing well overall. He had no prodrome prior to the episode. He had dizziness immediately preceding the shock (likely once he went into VF).  Otherwise he has been doing fine. He denies any chest pain or ischemic symptoms (Previously had arm pain leading to his cath in October.   Device History: StForensic scientist ICD implanted 2010 for aborted cardiac arrest, ICM History of appropriate therapy: Yes History of AAD therapy: No   Past Medical History:  Diagnosis Date  . Aortic atherosclerosis ([place])    on CXR  . Blood in stool   . C. difficile colitis    a. remote hx 2010.  . Cardiac arrest - ventricular fibrillation 02/11/2009   a. 02/2009 s/p St Jude ICD.  [name] Chronic systolic CHF (congestive heart failure) ([place])   . Coronary artery disease    a. PCI to Cx age 78. b. CABGx4 in 2003. c. BMS to [place] in 12/2007.  [name] Former tobacco use   . Hyperlipidemia   . Hypertension   . Ischemic cardiomyopathy   . Marijuana abuse   . Myocardial infarct ([name])    NON-ST-SEGMENT ELEVATION MI  . Obesity   . PAD (peripheral artery disease) ([place])    a. RLE by noninvasive testing - managed medically for now.  . Ventricular tachyarrhythmia ([place])   . Ventricular tachycardia [facility])    Past Surgical History:  Procedure Laterality Date  . ABDOMINAL AORTOGRAM W/LOWER EXTREMITY N/A 05/14/2019   Procedure: ABDOMINAL AORTOGRAM W/LOWER EXTREMITY;  Surgeon: Wellington Hampshire, MD;  Location: [place] [facility];  Service:  Cardiovascular;  Laterality: N/A;  . CARDIAC DEFIBRILLATOR PLACEMENT     STJ single chamber ICD implanted for secondary prevention  . CIRCUMCISION    . CORONARY ARTERY BYPASS GRAFT  06/17/01   X 4  . CORONARY BALLOON ANGIOPLASTY N/A 01/22/2019   Procedure: CORONARY BALLOON ANGIOPLASTY;  Surgeon: Leonie Man, MD;  Location: [place] [facility];  Service: Cardiovascular;  Laterality: N/A;  . LEFT HEART CATH AND CORS/GRAFTS ANGIOGRAPHY N/A 01/22/2019   Procedure: LEFT HEART CATH AND CORS/GRAFTS ANGIOGRAPHY;  Surgeon: Leonie Man, MD;  Location: [place] [facility];  Service: Cardiovascular;  Laterality: N/A;  . PERIPHERAL VASCULAR INTERVENTION Right 05/14/2019   Procedure: PERIPHERAL VASCULAR INTERVENTION;  Surgeon: Wellington Hampshire, MD;  Location: [place] [facility];  Service: Cardiovascular;  Laterality: Right;    Current Outpatient Medications  Medication Sig Dispense Refill  . apixaban (ELIQUIS) 5 MG TABS tablet Take 1 tablet (5 mg total) by mouth 2 (two) times daily. 180 tablet 3  . atorvastatin (LIPITOR) 80 MG tablet TAKE 1 TABLET BY MOUTH EVERY DAY 90 tablet 3  . benzonatate (TESSALON) 100 MG capsule Take 1 capsule (100 mg total) by mouth 2 (two) times daily as needed for cough. 20 capsule 0  . bisoprolol (ZEBETA) 5 MG tablet Take 1 tablet (5 mg total) by mouth daily. 90 tablet 3  . carvedilol (COREG) 25 MG tablet Take 1 tablet (25 mg total) by mouth 2 (  two) times daily. 180 tablet 3  . clopidogrel (PLAVIX) 75 MG tablet Take 1 tablet (75 mg total) by mouth daily. 90 tablet 3  . ezetimibe (ZETIA) 10 MG tablet Take 1 tablet (10 mg total) by mouth daily. 90 tablet 3  . fluticasone (FLONASE) 50 MCG/ACT nasal spray Place 2 sprays into both nostrils daily as needed for allergies or rhinitis. 16 g 11  . furosemide (LASIX) 20 MG tablet Take 1 tablet (20 mg total) by mouth daily. 90 tablet 3  . isosorbide mononitrate (IMDUR) 30 MG 24 hr tablet TAKE 1 TABLET BY MOUTH EVERY DAY 90 tablet 3    . montelukast (SINGULAIR) 10 MG tablet Take 1 tablet (10 mg total) by mouth daily as needed (allergies). 90 tablet 1  . Multiple Vitamin (MULTIVITAMIN WITH MINERALS) TABS tablet Take 1 tablet by mouth daily.    . nitroGLYCERIN (NITROSTAT) 0.4 MG SL tablet PLACE 1 TABLET (0.4 MG TOTAL) UNDER THE TONGUE EVERY 5 (FIVE) MINUTES AS NEEDED FOR CHEST PAIN. 75 tablet 1  . pantoprazole (PROTONIX) 40 MG tablet Take 1 tablet (40 mg total) by mouth daily. 90 tablet 3  . potassium chloride SA (KLOR-CON M20) 20 MEQ tablet Take 1 tablet (20 mEq total) by mouth daily. 90 tablet 3  . lisinopril (ZESTRIL) 2.5 MG tablet Take 1 tablet (2.5 mg total) by mouth daily. 90 tablet 1   No current facility-administered medications for this visit.    Allergies:   Patient has no known allergies.   Social History: Social History   Socioeconomic History  . Marital status: Married    Spouse name: Not on file  . Number of children: 3  . Years of education: 61  . Highest education level: Not on file  Occupational History  . Occupation: Disability  Tobacco Use  . Smoking status: Former Smoker    Packs/day: 2.00    Years: 20.00    Pack years: 40.00    Quit date: 04/04/2003    Years since quitting: 16.5  . Smokeless tobacco: Never Used  Vaping Use  . Vaping Use: Never used  Substance and Sexual Activity  . Alcohol use: No  . Drug use: No  . Sexual activity: Yes    Partners: Female  Other Topics Concern  . Not on file  Social History Narrative   MARRIED   FORMER TOBACCO USE, SMOKED FOR 20 YRS 2 PPD; QUIT IN 2005   NO ETOH   NO ILLICIT DRUG USE   NO REGULAR EXERCISE         ICD-ST. JUDE ; CERTIFIED LETTER SENT AND RETURNED BAD ADDRESS, PLS UPDATE DJW.      Fun: Physiological scientist Strain:   . Difficulty of Paying Living Expenses:   Food Insecurity:   . Worried About Charity fundraiser in the Last Year:   . Arboriculturist in the Last Year:    Transportation Needs:   . Film/video editor (Medical):   [name] Lack of Transportation (Non-Medical):   Physical Activity: Sufficiently Active  . Days of Exercise per Week: 7 days  . Minutes of Exercise per Session: 30 min  Stress: No Stress Concern Present  . Feeling of Stress : Not at all  Social Connections: Unknown  . Frequency of Communication with Friends and Family: More than three times a week  . Frequency of Social Gatherings with Friends and Family: More than three times a week  .  Attends Religious Services: Patient refused  . Active Member of Clubs or Organizations: Patient refused  . Attends Archivist Meetings: Patient refused  . Marital Status: Married  Human resources officer Violence: Not At Risk  . Fear of Current or Ex-Partner: No  . Emotionally Abused: No  . Physically Abused: No  . Sexually Abused: No    Family History: Family History  Problem Relation Age of Onset  . Hypertension Mother   . Heart attack Father        DIED AT 35 FROM MI  . Heart disease Sister        CAD AND PREVIOUS CABG  . Hypertension Other        IN MOST OF HIS SIBLINGS    Review of Systems: All other systems reviewed and are otherwise negative except as noted above.   Physical Exam: Vitals:   10/15/19 0851  BP: 96/70  Pulse: 67  SpO2: 94%  Weight: 263 lb 6.4 oz (119.5 kg)  Height: '5\' 11"'$  (1.803 m)     Orthostatic vitals Sitting 92/74 Standing at 0 minutes 92/72 Standing at 3 minutes 88/70  GEN- The patient is well appearing, alert and oriented x 3 today.   HEENT: normocephalic, atraumatic; sclera clear, conjunctiva pink; hearing intact; oropharynx clear; neck supple, no JVP Lymph- no cervical lymphadenopathy Lungs- Clear to ausculation bilaterally, normal work of breathing.  No wheezes, rales, rhonchi Heart- Regular rate and rhythm, no murmurs, rubs or gallops, PMI not laterally displaced GI- soft, non-tender, non-distended, bowel sounds present, no  hepatosplenomegaly Extremities- no clubbing, cyanosis, or edema; DP/PT/radial pulses 2+ bilaterally MS- no significant deformity or atrophy Skin- warm and dry, no rash or lesion; ICD pocket well healed Psych- euthymic mood, full affect Neuro- strength and sensation are intact  ICD interrogation- reviewed in detail today,  See PACEART report  EKG:  EKG is not ordered today.  Recent Labs: 01/23/2019: TSH 2.076 02/15/2019: B Natriuretic Peptide 52.4; Magnesium 1.9 10/09/2019: ALT 34; BUN 15; Creatinine, Ser 1.20; Hemoglobin 15.6; Platelets 279; Potassium 4.6; Sodium 139   Wt Readings from Last 3 Encounters:  10/15/19 263 lb 6.4 oz (119.5 kg)  10/14/19 261 lb (118.4 kg)  10/09/19 260 lb (117.9 kg)     Other studies Reviewed: Additional studies/ records that were reviewed today include: Previous EP office notes, previous echo, previous cath, previous remotes   Assessment and Plan:  Aborted cardiac arrest     Ischemic heart disease with prior bypass surgery recent angioplasty  Hyperlipidemia  Defibrillator-St. Jude   Intercurrent therapy SVT-inappropriate with acceleration  1.  Chronic systolic dysfunction with improved EF s/p St. Jude single chamber ICD  euvolemic today Stable on an appropriate medical regimen Normal ICD function See Pace Art report No changes today  2. VF with ICD shock Bisoprolol has been increased. Labs today. He has had no further shocks.  EF normal 01/2019. Also had cath at that time with balloon angioplasty of Ost Cx lesion.  Denies ischemic symptoms. If worsens, may require further ischemic work up with significant disease burden If recurs would update echo sooner than follow up.   3. CAD s/p CABG with in graft stenoses Most recent ischemic work up 01/2019 with balloon to [place] Cx. SCG-1st diag graft was 40% stenosed. LIMA-LAD, SVG-RCA (known), and SVG-Cx(OM2) (known) were all 100% stenosed. Continue BB therapy and plavix. He is also on  Eliquis.  4. Paroxysmal AF This was noted during admission 02/2019.  Continue eliquis for CHA2DS2VASC of at least  3.    5. Hypotension Likely in setting of polypharmacy Orthostatics negative.  Will stop HCTZ and further decrease lisinopril (most recent EF normal, but would not stop at this time) Continue BB x2 with recent VF.  Current medicines are reviewed at length with the patient today.   The patient has concerns regarding his medicines in setting of hypotension.  The following changes were made today:  Stop lisinopril/HCTZ combo.  Further decrease lisinopril to 2.5 mg daily.   Labs/ tests ordered today include:  Orders Placed This Encounter  Procedures  . CBC w/Diff  . Comp Met (CMET)  . Magnesium  . TSH  . CUP PACEART INCLINIC DEVICE CHECK    Disposition:   Follow up with Dr. Caryl Comes as scheduled. Sooner if further VT.   Jacalyn Lefevre, PA-C  10/15/2019 9:33 AM  [facility] [address] [place] [address] [phone] (office) [phone] (fax)

## 2019-10-14 NOTE — Progress Notes (Signed)
Patient notified of results & recommendations. Expressed understanding.

## 2019-10-14 NOTE — Patient Instructions (Signed)
Medication Instructions:   NO CHANGE  *If you need a refill on your cardiac medications before your next appointment, please call your pharmacy*   Lab Work: If you have labs (blood work) drawn today and your tests are completely normal, you will receive your results only by: [name] MyChart Message (if you have MyChart) OR . A paper copy in the mail If you have any lab test that is abnormal or we need to change your treatment, we will call you to review the results.   Testing/Procedures:  Your physician has requested that you have an ankle brachial index (ABI). During this test an ultrasound and blood pressure cuff are used to evaluate the arteries that supply the arms and legs with blood. Allow thirty minutes for this exam. There are no restrictions or special instructions.SCHEDULE IN September  Your physician has requested that you have an Aorta/Iliac Duplex. This will be take place at [place], [address].    No food after 11PM the night before.  Water is OK. (Don't drink liquids if you have been instructed not to for ANOTHER test).  Take two Extra-Strength Gas-X capsules at bedtime the night before test.   Take an additional two Extra-Strength Gas-X capsules three (3) hours before the test or first thing in the morning.    Avoid foods that produce bowel gas, for 24 hours prior to exam (see below).    No breakfast, no chewing gum, no smoking or carbonated beverages.  Patient may take morning medications with water.  Come in for test at least 15 minutes early to register.    SCHEDULE IN SEPTEMBER   Follow-Up: At [facility], you and your health needs are our priority.  As part of our continuing mission to provide you with exceptional heart care, we have created designated Provider Care Teams.  These Care Teams include your primary Cardiologist (physician) and Advanced Practice Providers (APPs -  Physician Assistants and Nurse Practitioners) who all work together to  provide you with the care you need, when you need it.  We recommend signing up for the patient portal called "MyChart".  Sign up information is provided on this After Visit Summary.  MyChart is used to connect with patients for Virtual Visits (Telemedicine).  Patients are able to view lab/test results, encounter notes, upcoming appointments, etc.  Non-urgent messages can be sent to your provider as well.   To learn more about what you can do with MyChart, go to [email or web address].    Your next appointment:   6 month(s)  The format for your next appointment:   In Person  Provider:   You may see DR Fletcher Anon or one of the following Advanced Practice Providers on your designated Care Team:    Kerin Ransom, PA-C  Painter, [place]  Coletta Memos, [place]

## 2019-10-15 ENCOUNTER — Encounter: Payer: Self-pay | Admitting: Student

## 2019-10-15 ENCOUNTER — Ambulatory Visit (INDEPENDENT_AMBULATORY_CARE_PROVIDER_SITE_OTHER): Payer: Medicare Other | Admitting: Student

## 2019-10-15 VITALS — BP 96/70 | HR 67 | Ht 71.0 in | Wt 263.4 lb

## 2019-10-15 DIAGNOSIS — I4901 Ventricular fibrillation: Secondary | ICD-10-CM | POA: Diagnosis not present

## 2019-10-15 DIAGNOSIS — I5022 Chronic systolic (congestive) heart failure: Secondary | ICD-10-CM

## 2019-10-15 DIAGNOSIS — I480 Paroxysmal atrial fibrillation: Secondary | ICD-10-CM

## 2019-10-15 DIAGNOSIS — I739 Peripheral vascular disease, unspecified: Secondary | ICD-10-CM | POA: Diagnosis not present

## 2019-10-15 DIAGNOSIS — Z95810 Presence of automatic (implantable) cardiac defibrillator: Secondary | ICD-10-CM | POA: Diagnosis not present

## 2019-10-15 DIAGNOSIS — I255 Ischemic cardiomyopathy: Secondary | ICD-10-CM

## 2019-10-15 DIAGNOSIS — I472 Ventricular tachycardia: Secondary | ICD-10-CM | POA: Diagnosis not present

## 2019-10-15 DIAGNOSIS — I2510 Atherosclerotic heart disease of native coronary artery without angina pectoris: Secondary | ICD-10-CM

## 2019-10-15 LAB — CUP PACEART INCLINIC DEVICE CHECK
Battery Remaining Longevity: 26 mo
Brady Statistic RV Percent Paced: 0.03 %
Date Time Interrogation Session: 20210714092757
HighPow Impedance: 58.7019
Implantable Lead Implant Date: 20101111
Implantable Lead Location: 753860
Implantable Lead Model: 7121
Implantable Pulse Generator Implant Date: 20101111
Lead Channel Impedance Value: 512.5 Ohm
Lead Channel Pacing Threshold Amplitude: 1 V
Lead Channel Pacing Threshold Amplitude: 1 V
Lead Channel Pacing Threshold Pulse Width: 0.5 ms
Lead Channel Pacing Threshold Pulse Width: 0.5 ms
Lead Channel Sensing Intrinsic Amplitude: 12 mV
Lead Channel Setting Pacing Amplitude: 2.5 V
Lead Channel Setting Pacing Pulse Width: 0.5 ms
Lead Channel Setting Sensing Sensitivity: 0.5 mV
Pulse Gen Serial Number: 743367

## 2019-10-15 LAB — CBC WITH DIFFERENTIAL/PLATELET
Basophils Absolute: 0.1 10*3/uL (ref 0.0–0.2)
Basos: 1 %
EOS (ABSOLUTE): 0.3 10*3/uL (ref 0.0–0.4)
Eos: 3 %
Hematocrit: 48.9 % (ref 37.5–51.0)
Hemoglobin: 16 g/dL (ref 13.0–17.7)
Immature Grans (Abs): 0 10*3/uL (ref 0.0–0.1)
Immature Granulocytes: 0 %
Lymphocytes Absolute: 2.9 10*3/uL (ref 0.7–3.1)
Lymphs: 27 %
MCH: 26.3 pg — ABNORMAL LOW (ref 26.6–33.0)
MCHC: 32.7 g/dL (ref 31.5–35.7)
MCV: 80 fL (ref 79–97)
Monocytes Absolute: 0.8 10*3/uL (ref 0.1–0.9)
Monocytes: 8 %
Neutrophils Absolute: 6.4 10*3/uL (ref 1.4–7.0)
Neutrophils: 61 %
Platelets: 293 10*3/uL (ref 150–450)
RBC: 6.09 x10E6/uL — ABNORMAL HIGH (ref 4.14–5.80)
RDW: 16.7 % — ABNORMAL HIGH (ref 11.6–15.4)
WBC: 10.5 10*3/uL (ref 3.4–10.8)

## 2019-10-15 LAB — COMPREHENSIVE METABOLIC PANEL
ALT: 28 IU/L (ref 0–44)
AST: 26 IU/L (ref 0–40)
Albumin/Globulin Ratio: 1.3 (ref 1.2–2.2)
Albumin: 4.2 g/dL (ref 3.8–4.9)
Alkaline Phosphatase: 128 IU/L — ABNORMAL HIGH (ref 48–121)
BUN/Creatinine Ratio: 14 (ref 9–20)
BUN: 15 mg/dL (ref 6–24)
Bilirubin Total: 0.4 mg/dL (ref 0.0–1.2)
CO2: 20 mmol/L (ref 20–29)
Calcium: 9.7 mg/dL (ref 8.7–10.2)
Chloride: 103 mmol/L (ref 96–106)
Creatinine, Ser: 1.09 mg/dL (ref 0.76–1.27)
GFR calc Af Amer: 89 mL/min/{1.73_m2} (ref >59)
GFR calc non Af Amer: 77 mL/min/{1.73_m2} (ref >59)
Globulin, Total: 3.2 g/dL (ref 1.5–4.5)
Glucose: 108 mg/dL — ABNORMAL HIGH (ref 65–99)
Potassium: 4.5 mmol/L (ref 3.5–5.2)
Sodium: 137 mmol/L (ref 134–144)
Total Protein: 7.4 g/dL (ref 6.0–8.5)

## 2019-10-15 LAB — MAGNESIUM: Magnesium: 2 mg/dL (ref 1.6–2.3)

## 2019-10-15 LAB — TSH: TSH: 2.13 u[IU]/mL (ref 0.450–4.500)

## 2019-10-15 MED ORDER — LISINOPRIL 2.5 MG PO TABS
2.5000 mg | ORAL_TABLET | Freq: Every day | ORAL | 1 refills | Status: DC
Start: 2019-10-15 — End: 2020-02-10

## 2019-10-15 NOTE — Patient Instructions (Addendum)
Medication Instructions:  Your physician has recommended you make the following change in your medication:  -- STOP Lisinopril - Hydrochlorothiazide 10/12.5 mg -- START Lisinopril 2.5 mg - Take 1 tablet (2.5 mg) by mouth once daily *If you need a refill on your cardiac medications before your next appointment, please call your pharmacy*  Lab Work: Your physician has recommended that you have lab work today: CMET, TSH, Magnesium level, and CBC  If you have labs (blood work) drawn today and your tests are completely normal, you will receive your results only by: [name] MyChart Message (if you have MyChart) OR . A paper copy in the mail If you have any lab test that is abnormal or we need to change your treatment, we will call you to review the results.  Follow-Up: At [facility], you and your health needs are our priority.  As part of our continuing mission to provide you with exceptional heart care, we have created designated Provider Care Teams.  These Care Teams include your primary Cardiologist (physician) and Advanced Practice Providers (APPs -  Physician Assistants and Nurse Practitioners) who all work together to provide you with the care you need, when you need it.  We recommend signing up for the patient portal called "MyChart".  Sign up information is provided on this After Visit Summary.  MyChart is used to connect with patients for Virtual Visits (Telemedicine).  Patients are able to view lab/test results, encounter notes, upcoming appointments, etc.  Non-urgent messages can be sent to your provider as well.   To learn more about what you can do with MyChart, go to [email or web address].    Your next appointment:   Your physician wants you to follow-up in: 6 MONTHS with Dr. Caryl Comes. You will receive a reminder letter in the mail two months in advance. If you don't receive a letter, please call our office to schedule the follow-up appointment.  Remote monitoring is used to monitor your  ICD from home. This monitoring reduces the number of office visits required to check your device to one time per year. It allows [place] to keep an eye on the functioning of your device to ensure it is working properly. You are scheduled for a device check from home on 11/26/19. You may send your transmission at any time that day. If you have a wireless device, the transmission will be sent automatically. After your physician reviews your transmission, you will receive a postcard with your next transmission date.  The format for your next appointment:   In Person with Virl Axe, MD

## 2019-10-21 ENCOUNTER — Telehealth: Payer: Self-pay | Admitting: Dermatology

## 2019-10-21 NOTE — Telephone Encounter (Signed)
Patient called to schedule an appointment for the lump on his scalp. Pt said his PCP sent over a referral and really wants is looked at. Appt Scheduled 03-23-20 @ 8:30 with Dr Denna Haggard.

## 2019-11-03 ENCOUNTER — Telehealth: Payer: Self-pay | Admitting: Cardiology

## 2019-11-03 NOTE — Telephone Encounter (Signed)
LVM for patient to return call to be scheduled for follow up with [facility] from recall list

## 2019-11-06 ENCOUNTER — Other Ambulatory Visit: Payer: Self-pay | Admitting: Cardiology

## 2019-11-06 DIAGNOSIS — E7800 Pure hypercholesterolemia, unspecified: Secondary | ICD-10-CM

## 2019-11-08 DIAGNOSIS — Z23 Encounter for immunization: Secondary | ICD-10-CM | POA: Diagnosis not present

## 2019-11-12 ENCOUNTER — Ambulatory Visit: Payer: Medicare Other | Admitting: Nurse Practitioner

## 2019-11-12 NOTE — Progress Notes (Deleted)
11/12/2019 Billy [name] [id number] 1965/05/22   CHIEF COMPLAINT:   HISTORY OF PRESENT ILLNESS:  Billy [name] is a 54 year old male with a past medical history of hypertension, hyperlipidemia, coronary artery disease s/p PCI, S/P 4 vessel CABG, s/p v fib cardiac arrest 2010 with AICD, s/p PTCA ost cx  04/2018, SVT, VT, paroxysmal atrial fibrillation 11/20202 on Eliquis, chronic systolic CHF, PAD s/p stent placement to the right external iliac artery.  He was referred to our office by Dr. Phill Myron to schedule a screening colonoscopy.   CBC Latest Ref Rng & Units 10/15/2019 10/09/2019 05/06/2019  WBC 3.4 - 10.8 x10E3/uL 10.5 10.5 10.5  Hemoglobin 13.0 - 17.7 g/dL 16.0 15.6 15.6  Hematocrit 37.5 - 51.0 % 48.9 47.2 48.1  Platelets 150 - 450 x10E3/uL 293 279 262   CMP Latest Ref Rng & Units 10/15/2019 10/09/2019 05/06/2019  Glucose 65 - 99 mg/dL 108(H) 98 101(H)  BUN 6 - 24 mg/dL '15 15 23  ' Creatinine 0.76 - 1.27 mg/dL 1.09 1.20 1.26  Sodium 134 - 144 mmol/L 137 139 138  Potassium 3.5 - 5.2 mmol/L 4.5 4.6 4.7  Chloride 96 - 106 mmol/L 103 101 100  CO2 20 - 29 mmol/L '20 25 23  ' Calcium 8.7 - 10.2 mg/dL 9.7 9.6 10.1  Total Protein 6.0 - 8.5 g/dL 7.4 7.9 -  Total Bilirubin 0.0 - 1.2 mg/dL 0.4 0.6 -  Alkaline Phos 48 - 121 IU/L 128(H) 125(H) -  AST 0 - 40 IU/L 26 27 -  ALT 0 - 44 IU/L 28 34 -   ECHO 01/23/2019: 1. Left ventricular ejection fraction, by visual estimation, is 50 to 55%. The left ventricle has mildly decreased function. Moderately increased left ventricular size. There is no left ventricular hypertrophy. 2. Mild inferior and apical hypokinesis 3. Left ventricular diastolic Doppler parameters are consistent with impaired relaxation pattern of LV diastolic filling. 4. Global right ventricle has mildly reduced systolic function.The right ventricular size is normal. No increase in right ventricular wall thickness. 5. Left atrial size was normal. 6. Right atrial size  was normal. 7. The mitral valve is grossly normal. Trace mitral valve regurgitation. 8. The tricuspid valve is grossly normal. Tricuspid valve regurgitation was not visualized by color flow Doppler. 9. The aortic valve is tricuspid Aortic valve regurgitation was not visualized by color flow Doppler. 10. The pulmonic valve was grossly normal. Pulmonic valve regurgitation is not visualized by color flow Doppler. 11. The inferior vena cava is normal in size with greater than 50% respiratory variability,   Past Medical History:  Diagnosis Date  . Aortic atherosclerosis ([place])    on CXR  . Blood in stool   . C. difficile colitis    a. remote hx 2010.  . Cardiac arrest - ventricular fibrillation 02/11/2009   a. 02/2009 s/p St Jude ICD.  [name] Chronic systolic CHF (congestive heart failure) ([place])   . Coronary artery disease    a. PCI to Cx age 46. b. CABGx4 in 2003. c. BMS to [place] in 12/2007.  [name] Former tobacco use   . Hyperlipidemia   . Hypertension   . Ischemic cardiomyopathy   . Marijuana abuse   . Myocardial infarct ([name])    NON-ST-SEGMENT ELEVATION MI  . Obesity   . PAD (peripheral artery disease) ([place])    a. RLE by noninvasive testing - managed medically for now.  . Ventricular tachyarrhythmia ([place])   . Ventricular tachycardia [facility])    Past Surgical  History:  Procedure Laterality Date  . ABDOMINAL AORTOGRAM W/LOWER EXTREMITY N/A 05/14/2019   Procedure: ABDOMINAL AORTOGRAM W/LOWER EXTREMITY;  Surgeon: Wellington Hampshire, MD;  Location: [place] [facility];  Service: Cardiovascular;  Laterality: N/A;  . CARDIAC DEFIBRILLATOR PLACEMENT     STJ single chamber ICD implanted for secondary prevention  . CIRCUMCISION    . CORONARY ARTERY BYPASS GRAFT  06/17/01   X 4  . CORONARY BALLOON ANGIOPLASTY N/A 01/22/2019   Procedure: CORONARY BALLOON ANGIOPLASTY;  Surgeon: Leonie Man, MD;  Location: [place] [facility];  Service: Cardiovascular;  Laterality: N/A;  . LEFT HEART CATH AND  CORS/GRAFTS ANGIOGRAPHY N/A 01/22/2019   Procedure: LEFT HEART CATH AND CORS/GRAFTS ANGIOGRAPHY;  Surgeon: Leonie Man, MD;  Location: [place] [facility];  Service: Cardiovascular;  Laterality: N/A;  . PERIPHERAL VASCULAR INTERVENTION Right 05/14/2019   Procedure: PERIPHERAL VASCULAR INTERVENTION;  Surgeon: Wellington Hampshire, MD;  Location: [place] [facility];  Service: Cardiovascular;  Laterality: Right;   Social History: The patient  reports that he quit smoking about 16 years ago. He has a 40.00 pack-year smoking history. He has never used smokeless tobacco. He reports that he does not drink alcohol and does not use drugs.     Family History: The patient's family history includes Heart attack in his father; Heart disease in his sister; Hypertension in his mother and another family member.    reports that he quit smoking about 16 years ago. He has a 40.00 pack-year smoking history. He has never used smokeless tobacco. He reports that he does not drink alcohol and does not use drugs. family history includes Heart attack in his father; Heart disease in his sister; Hypertension in his mother and another family member. No Known Allergies    Outpatient Encounter Medications as of 11/12/2019  Medication Sig  . ezetimibe (ZETIA) 10 MG tablet TAKE 1 TABLET BY MOUTH EVERY DAY  . apixaban (ELIQUIS) 5 MG TABS tablet Take 1 tablet (5 mg total) by mouth 2 (two) times daily.  [name] atorvastatin (LIPITOR) 80 MG tablet TAKE 1 TABLET BY MOUTH EVERY DAY  . benzonatate (TESSALON) 100 MG capsule Take 1 capsule (100 mg total) by mouth 2 (two) times daily as needed for cough.  . bisoprolol (ZEBETA) 5 MG tablet Take 1 tablet (5 mg total) by mouth daily.  . carvedilol (COREG) 25 MG tablet Take 1 tablet (25 mg total) by mouth 2 (two) times daily.  . clopidogrel (PLAVIX) 75 MG tablet Take 1 tablet (75 mg total) by mouth daily.  . fluticasone (FLONASE) 50 MCG/ACT nasal spray Place 2 sprays into both nostrils daily  as needed for allergies or rhinitis.  . furosemide (LASIX) 20 MG tablet Take 1 tablet (20 mg total) by mouth daily.  . isosorbide mononitrate (IMDUR) 30 MG 24 hr tablet TAKE 1 TABLET BY MOUTH EVERY DAY  . lisinopril (ZESTRIL) 2.5 MG tablet Take 1 tablet (2.5 mg total) by mouth daily.  . montelukast (SINGULAIR) 10 MG tablet Take 1 tablet (10 mg total) by mouth daily as needed (allergies).  . Multiple Vitamin (MULTIVITAMIN WITH MINERALS) TABS tablet Take 1 tablet by mouth daily.  . nitroGLYCERIN (NITROSTAT) 0.4 MG SL tablet PLACE 1 TABLET (0.4 MG TOTAL) UNDER THE TONGUE EVERY 5 (FIVE) MINUTES AS NEEDED FOR CHEST PAIN.  [name] pantoprazole (PROTONIX) 40 MG tablet Take 1 tablet (40 mg total) by mouth daily.  . potassium chloride SA (KLOR-CON M20) 20 MEQ tablet Take 1 tablet (20  mEq total) by mouth daily.   No facility-administered encounter medications on file as of 11/12/2019.     REVIEW OF SYSTEMS: All other systems reviewed and negative except where noted in the History of Present Illness.  Gen: Denies fever, sweats or chills. No weight loss.  CV: Denies chest pain, palpitations or edema. Resp: Denies cough, shortness of breath of hemoptysis.  GI: Denies heartburn, dysphagia, stomach or lower abdominal pain. No diarrhea or constipation.  GU : Denies urinary burning, blood in urine, increased urinary frequency or incontinence. MS: Denies joint pain, muscles aches or weakness. Derm: Denies rash, itchiness, skin lesions or unhealing ulcers. Psych: Denies depression, anxiety, memory loss, suicidal ideation and confusion. Heme: Denies bruising, bleeding. Neuro:  Denies headaches, dizziness or paresthesias. Endo:  Denies any problems with DM, thyroid or adrenal function.    PHYSICAL EXAM: There were no vitals taken for this visit. General: Well developed ... in no acute distress. Head: Normocephalic and atraumatic. Eyes:  Sclerae non-icteric, conjunctive pink. Ears: Normal auditory  acuity. Mouth: Dentition intact. No ulcers or lesions.  Neck: Supple, no lymphadenopathy or thyromegaly.  Lungs: Clear bilaterally to auscultation without wheezes, crackles or rhonchi. Heart: Regular rate and rhythm. No murmur, rub or gallop appreciated.  Abdomen: Soft, nontender, non distended. No masses. No hepatosplenomegaly. Normoactive bowel sounds x 4 quadrants.  Rectal:  Musculoskeletal: Symmetrical with no gross deformities. Skin: Warm and dry. No rash or lesions on visible extremities. Extremities: No edema. Neurological: Alert oriented x 4, no focal deficits.  Psychological:  Alert and cooperative. Normal mood and affect.  ASSESSMENT AND PLAN:  25. 54 year old male presents to schedule a screening colonoscopy   2. CAD on Plavix   3. Atrial fibrillation on Eliquis CHA2DS2VASC of 3.    4. Mildly elevated alk phos level with normal AST/ALT and T. Bili  levels  -GGT, hepatic panel, alk phos isoenzymes      CC:  Caryl Never*

## 2019-11-26 ENCOUNTER — Ambulatory Visit (INDEPENDENT_AMBULATORY_CARE_PROVIDER_SITE_OTHER): Payer: Medicare Other | Admitting: *Deleted

## 2019-11-26 DIAGNOSIS — I4901 Ventricular fibrillation: Secondary | ICD-10-CM

## 2019-11-28 LAB — CUP PACEART REMOTE DEVICE CHECK
Battery Remaining Longevity: 24 mo
Battery Remaining Percentage: 20 %
Battery Voltage: 2.77 V
Brady Statistic RV Percent Paced: 1 %
Date Time Interrogation Session: 20210825053259
HighPow Impedance: 51 Ohm
Implantable Lead Implant Date: 20101111
Implantable Lead Location: 753860
Implantable Lead Model: 7121
Implantable Pulse Generator Implant Date: 20101111
Lead Channel Impedance Value: 480 Ohm
Lead Channel Pacing Threshold Amplitude: 1 V
Lead Channel Pacing Threshold Pulse Width: 0.5 ms
Lead Channel Sensing Intrinsic Amplitude: 12 mV
Lead Channel Setting Pacing Amplitude: 2.5 V
Lead Channel Setting Pacing Pulse Width: 0.5 ms
Lead Channel Setting Sensing Sensitivity: 0.5 mV
Pulse Gen Serial Number: 743367

## 2019-12-02 ENCOUNTER — Telehealth: Payer: Self-pay

## 2019-12-02 NOTE — Telephone Encounter (Signed)
Transmission reviewed with patient and wife at patient request. Patient has 2 years of battery life, device function WNL, no episodes or events recorded, no treatment by device. Patient reported feeling device " vibrate " during the sending of the remote transmission. Patient reports no CP, SOB, dizziness, syncope or palpitations. He reports that he " may be having a muscle spasm." Patient to call device clinic if he has the sensation again.

## 2019-12-02 NOTE — Telephone Encounter (Signed)
The pt wife states his ICD vibrated. I told her when he get home to have him send a manual transmission. Once we receive the transmission the nurse will review it and give him a call back.

## 2019-12-02 NOTE — Progress Notes (Signed)
Remote ICD transmission.   

## 2019-12-09 ENCOUNTER — Other Ambulatory Visit: Payer: Self-pay | Admitting: Critical Care Medicine

## 2019-12-09 ENCOUNTER — Other Ambulatory Visit: Payer: Medicare Other

## 2019-12-09 DIAGNOSIS — Z20822 Contact with and (suspected) exposure to covid-19: Secondary | ICD-10-CM

## 2019-12-10 LAB — NOVEL CORONAVIRUS, NAA: SARS-CoV-2, NAA: NOT DETECTED

## 2019-12-10 LAB — SARS-COV-2, NAA 2 DAY TAT

## 2019-12-15 ENCOUNTER — Inpatient Hospital Stay (HOSPITAL_COMMUNITY): Admission: RE | Admit: 2019-12-15 | Payer: Medicare Other | Source: Ambulatory Visit

## 2019-12-16 ENCOUNTER — Encounter (HOSPITAL_COMMUNITY): Payer: Self-pay

## 2020-01-01 NOTE — Progress Notes (Deleted)
01/01/2020 Billy [name] [id number] [birth year]   CHIEF COMPLAINT:   HISTORY OF PRESENT ILLNESS:  Billy [name] is a 54 year old male with a past medial history of coronary artery disease s/p MI, s/p 4 vessel CABG in 20003, ventricular fibrillation cardiac arrest 2010, ICD placement, PTCA 2020 on Plavix,  systolic CHF, atrial fibrillation on Eliquis, peripheral arterial disease and c.diff in 2010.   Last SVT to vfib shock 09/17/2019  CBC Latest Ref Rng & Units 10/15/2019 10/09/2019 05/06/2019  WBC 3.4 - 10.8 x10E3/uL 10.5 10.5 10.5  Hemoglobin 13.0 - 17.7 g/dL 16.0 15.6 15.6  Hematocrit 37.5 - 51.0 % 48.9 47.2 48.1  Platelets 150 - 450 x10E3/uL 293 279 262     CMP Latest Ref Rng & Units 10/15/2019 10/09/2019 05/06/2019  Glucose 65 - 99 mg/dL 108(H) 98 101(H)  BUN 6 - 24 mg/dL 15 15 23   Creatinine 0.76 - 1.27 mg/dL 1.09 1.20 1.26  Sodium 134 - 144 mmol/L 137 139 138  Potassium 3.5 - 5.2 mmol/L 4.5 4.6 4.7  Chloride 96 - 106 mmol/L 103 101 100  CO2 20 - 29 mmol/L 20 25 23   Calcium 8.7 - 10.2 mg/dL 9.7 9.6 10.1  Total Protein 6.0 - 8.5 g/dL 7.4 7.9 -  Total Bilirubin 0.0 - 1.2 mg/dL 0.4 0.6 -  Alkaline Phos 48 - 121 IU/L 128(H) 125(H) -  AST 0 - 40 IU/L 26 27 -  ALT 0 - 44 IU/L 28 34 -    Past Medical History:  Diagnosis Date  . Aortic atherosclerosis ([place])    on CXR  . Blood in stool   . C. difficile colitis    a. remote hx 2010.  . Cardiac arrest - ventricular fibrillation 02/11/2009   a. 02/2009 s/p St Jude ICD.  [name] Chronic systolic CHF (congestive heart failure) ([place])   . Coronary artery disease    a. PCI to Cx age 45. b. CABGx4 in 2003. c. BMS to [place] in 12/2007.  [name] Former tobacco use   . Hyperlipidemia   . Hypertension   . Ischemic cardiomyopathy   . Marijuana abuse   . Myocardial infarct ([name])    NON-ST-SEGMENT ELEVATION MI  . Obesity   . PAD (peripheral artery disease) ([place])    a. RLE by noninvasive testing - managed medically for now.  . Ventricular  tachyarrhythmia ([place])   . Ventricular tachycardia [facility])    Past Surgical History:  Procedure Laterality Date  . ABDOMINAL AORTOGRAM W/LOWER EXTREMITY N/A 05/14/2019   Procedure: ABDOMINAL AORTOGRAM W/LOWER EXTREMITY;  Surgeon: Wellington Hampshire, MD;  Location: [place] [facility];  Service: Cardiovascular;  Laterality: N/A;  . CARDIAC DEFIBRILLATOR PLACEMENT     STJ single chamber ICD implanted for secondary prevention  . CIRCUMCISION    . CORONARY ARTERY BYPASS GRAFT  06/17/01   X 4  . CORONARY BALLOON ANGIOPLASTY N/A 01/22/2019   Procedure: CORONARY BALLOON ANGIOPLASTY;  Surgeon: Leonie Man, MD;  Location: [place] [facility];  Service: Cardiovascular;  Laterality: N/A;  . LEFT HEART CATH AND CORS/GRAFTS ANGIOGRAPHY N/A 01/22/2019   Procedure: LEFT HEART CATH AND CORS/GRAFTS ANGIOGRAPHY;  Surgeon: Leonie Man, MD;  Location: [place] [facility];  Service: Cardiovascular;  Laterality: N/A;  . PERIPHERAL VASCULAR INTERVENTION Right 05/14/2019   Procedure: PERIPHERAL VASCULAR INTERVENTION;  Surgeon: Wellington Hampshire, MD;  Location: [place] [facility];  Service: Cardiovascular;  Laterality: Right;    reports that he quit smoking about 16  years ago. He has a 40.00 pack-year smoking history. He has never used smokeless tobacco. He reports that he does not drink alcohol and does not use drugs. family history includes Heart attack in his father; Heart disease in his sister; Hypertension in his mother and another family member. No Known Allergies    Outpatient Encounter Medications as of 01/02/2020  Medication Sig  . ezetimibe (ZETIA) 10 MG tablet TAKE 1 TABLET BY MOUTH EVERY DAY  . apixaban (ELIQUIS) 5 MG TABS tablet Take 1 tablet (5 mg total) by mouth 2 (two) times daily.  [name] atorvastatin (LIPITOR) 80 MG tablet TAKE 1 TABLET BY MOUTH EVERY DAY  . benzonatate (TESSALON) 100 MG capsule Take 1 capsule (100 mg total) by mouth 2 (two) times daily as needed for cough.  . bisoprolol (ZEBETA) 5  MG tablet Take 1 tablet (5 mg total) by mouth daily.  . carvedilol (COREG) 25 MG tablet Take 1 tablet (25 mg total) by mouth 2 (two) times daily.  . clopidogrel (PLAVIX) 75 MG tablet Take 1 tablet (75 mg total) by mouth daily.  . fluticasone (FLONASE) 50 MCG/ACT nasal spray Place 2 sprays into both nostrils daily as needed for allergies or rhinitis.  . furosemide (LASIX) 20 MG tablet Take 1 tablet (20 mg total) by mouth daily.  . isosorbide mononitrate (IMDUR) 30 MG 24 hr tablet TAKE 1 TABLET BY MOUTH EVERY DAY  . lisinopril (ZESTRIL) 2.5 MG tablet Take 1 tablet (2.5 mg total) by mouth daily.  . montelukast (SINGULAIR) 10 MG tablet Take 1 tablet (10 mg total) by mouth daily as needed (allergies).  . Multiple Vitamin (MULTIVITAMIN WITH MINERALS) TABS tablet Take 1 tablet by mouth daily.  . nitroGLYCERIN (NITROSTAT) 0.4 MG SL tablet PLACE 1 TABLET (0.4 MG TOTAL) UNDER THE TONGUE EVERY 5 (FIVE) MINUTES AS NEEDED FOR CHEST PAIN.  [name] pantoprazole (PROTONIX) 40 MG tablet Take 1 tablet (40 mg total) by mouth daily.  . potassium chloride SA (KLOR-CON M20) 20 MEQ tablet Take 1 tablet (20 mEq total) by mouth daily.   No facility-administered encounter medications on file as of 01/02/2020.     REVIEW OF SYSTEMS: All other systems reviewed and negative except where noted in the History of Present Illness.  Gen: Denies fever, sweats or chills. No weight loss.  CV: Denies chest pain, palpitations or edema. Resp: Denies cough, shortness of breath of hemoptysis.  GI: Denies heartburn, dysphagia, stomach or lower abdominal pain. No diarrhea or constipation.  GU : Denies urinary burning, blood in urine, increased urinary frequency or incontinence. MS: Denies joint pain, muscles aches or weakness. Derm: Denies rash, itchiness, skin lesions or unhealing ulcers. Psych: Denies depression, anxiety, memory loss, suicidal ideation and confusion. Heme: Denies bruising, bleeding. Neuro:  Denies headaches, dizziness  or paresthesias. Endo:  Denies any problems with DM, thyroid or adrenal function.    PHYSICAL EXAM: There were no vitals taken for this visit. General: Well developed ... in no acute distress. Head: Normocephalic and atraumatic. Eyes:  Sclerae non-icteric, conjunctive pink. Ears: Normal auditory acuity. Mouth: Dentition intact. No ulcers or lesions.  Neck: Supple, no lymphadenopathy or thyromegaly.  Lungs: Clear bilaterally to auscultation without wheezes, crackles or rhonchi. Heart: Regular rate and rhythm. No murmur, rub or gallop appreciated.  Abdomen: Soft, nontender, non distended. No masses. No hepatosplenomegaly. Normoactive bowel sounds x 4 quadrants.  Rectal:  Musculoskeletal: Symmetrical with no gross deformities. Skin: Warm and dry. No rash or lesions on visible extremities. Extremities: No edema. Neurological:  Alert oriented x 4, no focal deficits.  Psychological:  Alert and cooperative. Normal mood and affect.  ASSESSMENT AND PLAN:    CC:  Caryl Never*

## 2020-01-02 ENCOUNTER — Ambulatory Visit: Payer: Medicare Other | Admitting: Nurse Practitioner

## 2020-01-23 NOTE — Progress Notes (Deleted)
Virtual Visit via Video Note   This visit type was conducted due to national recommendations for restrictions regarding the COVID-19 Pandemic (e.g. social distancing) in an effort to limit this patient's exposure and mitigate transmission in our community.  Due to his co-morbid illnesses, this patient is at least at moderate risk for complications without adequate follow up.  This format is felt to be most appropriate for this patient at this time.  All issues noted in this document were discussed and addressed.  A limited physical exam was performed with this format.  Please refer to the patient's chart for his consent to telehealth for [facility].      Date:  01/23/2020   ID:  [name], DOB 1965-12-24, MRN [id number]  Patient Location:Home Provider Location: Home  PCP:  Nicolette Bang, DO  Cardiologist:  Dr Stanford Breed  Evaluation Performed:  Follow-Up Visit  Chief Complaint: FU CAD  History of Present Illness:     FU CAD. Patient suffered a cardiac arrest in November of 2010. Echocardiogram initially revealed an EF of 10-15% however followup echo showed an EF of 40-45%, mild biatrial enlargement and mild mitral regurgitation. Given out of the hospital VF arrest a St. Jude single lead ICD was placed. Had repeat cardiac catheterization October 2020 secondary to chest pain.  He was found to have known occlusion of the right coronary, OM 2 and minimal disease in the LAD; also noted to have an 80% circumflex which was treated with PTCA.  Ejection fraction 40 to 45%.. There was known occlusion of the saphenous vein graft to the right coronary artery and saphenous vein graft to the OM 2.  The LIMA to the LAD was atretic.  Saphenous vein graft to first diagonal 40 to 50%.  Echocardiogram October 2020 showed ejection fraction 50 to 36%, grade 1 diastolic dysfunction. Also had episode of atrial fibrillation November 2020. Patient had stent placed to the right external iliac artery  February 2021. Since I last saw him,   The patient does not have symptoms concerning for COVID-19 infection (fever, chills, cough, or new shortness of breath).    Past Medical History:  Diagnosis Date  . Aortic atherosclerosis ([place])    on CXR  . Blood in stool   . C. difficile colitis    a. remote hx 2010.  . Cardiac arrest - ventricular fibrillation 02/11/2009   a. 02/2009 s/p St Jude ICD.  [name] Chronic systolic CHF (congestive heart failure) ([place])   . Coronary artery disease    a. PCI to Cx age 71. b. CABGx4 in 2003. c. BMS to [place] in 12/2007.  [name] Former tobacco use   . Hyperlipidemia   . Hypertension   . Ischemic cardiomyopathy   . Marijuana abuse   . Myocardial infarct ([name])    NON-ST-SEGMENT ELEVATION MI  . Obesity   . PAD (peripheral artery disease) ([place])    a. RLE by noninvasive testing - managed medically for now.  . Ventricular tachyarrhythmia ([place])   . Ventricular tachycardia [facility])    Past Surgical History:  Procedure Laterality Date  . ABDOMINAL AORTOGRAM W/LOWER EXTREMITY N/A 05/14/2019   Procedure: ABDOMINAL AORTOGRAM W/LOWER EXTREMITY;  Surgeon: Wellington Hampshire, MD;  Location: [place] [facility];  Service: Cardiovascular;  Laterality: N/A;  . CARDIAC DEFIBRILLATOR PLACEMENT     STJ single chamber ICD implanted for secondary prevention  . CIRCUMCISION    . CORONARY ARTERY BYPASS GRAFT  06/17/01   X 4  . CORONARY  BALLOON ANGIOPLASTY N/A 01/22/2019   Procedure: CORONARY BALLOON ANGIOPLASTY;  Surgeon: Leonie Man, MD;  Location: [place] [facility];  Service: Cardiovascular;  Laterality: N/A;  . LEFT HEART CATH AND CORS/GRAFTS ANGIOGRAPHY N/A 01/22/2019   Procedure: LEFT HEART CATH AND CORS/GRAFTS ANGIOGRAPHY;  Surgeon: Leonie Man, MD;  Location: [place] [facility];  Service: Cardiovascular;  Laterality: N/A;  . PERIPHERAL VASCULAR INTERVENTION Right 05/14/2019   Procedure: PERIPHERAL VASCULAR INTERVENTION;  Surgeon: Wellington Hampshire, MD;  Location: [place] [facility];  Service: Cardiovascular;  Laterality: Right;     No outpatient medications have been marked as taking for the 01/29/20 encounter (Appointment) with Lelon Perla, MD.     Allergies:   Patient has no known allergies.   Social History   Tobacco Use  . Smoking status: Former Smoker    Packs/day: 2.00    Years: 20.00    Pack years: 40.00    Quit date: 04/04/2003    Years since quitting: 16.8  . Smokeless tobacco: Never Used  Vaping Use  . Vaping Use: Never used  Substance Use Topics  . Alcohol use: No  . Drug use: No     Family Hx: The patient's family history includes Heart attack in his father; Heart disease in his sister; Hypertension in his mother and another family member.  ROS:   Please see the history of present illness.    No Fever, chills  or productive cough All other systems reviewed and are negative.   Recent Labs: 02/15/2019: B Natriuretic Peptide 52.4 10/15/2019: ALT 28; BUN 15; Creatinine, Ser 1.09; Hemoglobin 16.0; Magnesium 2.0; Platelets 293; Potassium 4.5; Sodium 137; TSH 2.130   Recent Lipid Panel Lab Results  Component Value Date/Time   CHOL 119 10/09/2019 09:59 AM   TRIG 59 10/09/2019 09:59 AM   HDL 32 (L) 10/09/2019 09:59 AM   CHOLHDL 3.7 10/09/2019 09:59 AM   CHOLHDL 3.9 01/23/2019 05:15 AM   LDLCALC 74 10/09/2019 09:59 AM    Wt Readings from Last 3 Encounters:  10/15/19 263 lb 6.4 oz (119.5 kg)  10/14/19 261 lb (118.4 kg)  10/09/19 260 lb (117.9 kg)     Objective:    Vital Signs:  There were no vitals taken for this visit.   VITAL SIGNS:  reviewed NAD Answers questions appropriately Normal affect Remainder of physical examination not performed (telehealth visit; coronavirus pandemic)  ASSESSMENT & PLAN:    1. Disease-patient denies chest pain.  Continue medical therapy with Plavix, statin and beta-blocker. 2. Hypertension-patient's blood pressure is controlled.  Continue present medical regimen.  Check  potassium and renal function. 3. Hyperlipidemia-continue statin.  Check lipids and liver. 4. Ischemic cardiomyopathy-continue beta-blocker and lisinopril.  LV function improved on most recent echocardiogram.  He did not tolerate Entresto previously. 5. Paroxysmal atrial fibrillation-patient remains in sinus rhythm based on history.  Continue beta-blocker and apixaban.  Check hemoglobin and renal function. 6. Peripheral vascular disease-status post stent to right iliac.  Claudication has improved.  Continue Plavix and statin. 7. Tobacco abuse-patient states he has discontinued. 8. Prior ICD-followed by electrophysiology.  COVID-19 Education: The importance of social distancing was discussed today.  Time:   Today, I have spent 16 minutes with the patient with telehealth technology discussing the above problems.     Medication Adjustments/Labs and Tests Ordered: Current medicines are reviewed at length with the patient today.  Concerns regarding medicines are outlined above.   Tests Ordered: No orders of the defined types  were placed in this encounter.   Medication Changes: No orders of the defined types were placed in this encounter.   Follow Up:  In Person in 6 month(s)  Signed, Kirk Ruths, MD  01/23/2020 7:24 AM    [place]

## 2020-01-28 NOTE — Progress Notes (Deleted)
Virtual Visit via Video Note   This visit type was conducted due to national recommendations for restrictions regarding the COVID-19 Pandemic (e.g. social distancing) in an effort to limit this patient's exposure and mitigate transmission in our community.  Due to his co-morbid illnesses, this patient is at least at moderate risk for complications without adequate follow up.  This format is felt to be most appropriate for this patient at this time.  All issues noted in this document were discussed and addressed.  A limited physical exam was performed with this format.  Please refer to the patient's chart for his consent to telehealth for [facility].      Date:  01/28/2020   ID:  Billy [name], DOB [birth year], MRN [id number]  Patient Location:Home Provider Location: Home  PCP:  Nicolette Bang, DO  Cardiologist:  Dr Stanford Breed  Evaluation Performed:  Follow-Up Visit  Chief Complaint: FU CAD  History of Present Illness:     FU CAD. Patient suffered a cardiac arrest in November of 2010. Echocardiogram initially revealed an EF of 10-15% however followup echo showed an EF of 40-45%, mild biatrial enlargement and mild mitral regurgitation. Given out of the hospital VF arrest a St. Jude single lead ICD was placed. Had repeat cardiac catheterization October 2020 secondary to chest pain.  He was found to have known occlusion of the right coronary, OM 2 and minimal disease in the LAD; also noted to have an 80% circumflex which was treated with PTCA.  Ejection fraction 40 to 45%.. There was known occlusion of the saphenous vein graft to the right coronary artery and saphenous vein graft to the OM 2.  The LIMA to the LAD was atretic.  Saphenous vein graft to first diagonal 40 to 50%.  Echocardiogram October 2020 showed ejection fraction 50 to 16%, grade 1 diastolic dysfunction. Also had episode of atrial fibrillation November 2020. Patient had stent placed to the right external iliac artery  February 2021. Since I last saw him,   The patient does not have symptoms concerning for COVID-19 infection (fever, chills, cough, or new shortness of breath).    Past Medical History:  Diagnosis Date  . Aortic atherosclerosis ([place])    on CXR  . Blood in stool   . C. difficile colitis    a. remote hx 2010.  . Cardiac arrest - ventricular fibrillation 02/11/2009   a. 02/2009 s/p St Jude ICD.  [name] Chronic systolic CHF (congestive heart failure) ([place])   . Coronary artery disease    a. PCI to Cx age 25. b. CABGx4 in 54. c. BMS to [place] in 54.  [name] Former tobacco use   . Hyperlipidemia   . Hypertension   . Ischemic cardiomyopathy   . Marijuana abuse   . Myocardial infarct ([name])    NON-ST-SEGMENT ELEVATION MI  . Obesity   . PAD (peripheral artery disease) ([place])    a. RLE by noninvasive testing - managed medically for now.  . Ventricular tachyarrhythmia ([place])   . Ventricular tachycardia [facility])    Past Surgical History:  Procedure Laterality Date  . ABDOMINAL AORTOGRAM W/LOWER EXTREMITY N/A 05/14/2019   Procedure: ABDOMINAL AORTOGRAM W/LOWER EXTREMITY;  Surgeon: Wellington Hampshire, MD;  Location: [place] [facility];  Service: Cardiovascular;  Laterality: N/A;  . CARDIAC DEFIBRILLATOR PLACEMENT     STJ single chamber ICD implanted for secondary prevention  . CIRCUMCISION    . CORONARY ARTERY BYPASS GRAFT  06/17/01   X 4  . CORONARY  BALLOON ANGIOPLASTY N/A 01/22/2019   Procedure: CORONARY BALLOON ANGIOPLASTY;  Surgeon: Leonie Man, MD;  Location: [place] [facility];  Service: Cardiovascular;  Laterality: N/A;  . LEFT HEART CATH AND CORS/GRAFTS ANGIOGRAPHY N/A 01/22/2019   Procedure: LEFT HEART CATH AND CORS/GRAFTS ANGIOGRAPHY;  Surgeon: Leonie Man, MD;  Location: [place] [facility];  Service: Cardiovascular;  Laterality: N/A;  . PERIPHERAL VASCULAR INTERVENTION Right 05/14/2019   Procedure: PERIPHERAL VASCULAR INTERVENTION;  Surgeon: Wellington Hampshire, MD;  Location: [place] [facility];  Service: Cardiovascular;  Laterality: Right;     No outpatient medications have been marked as taking for the 02/05/20 encounter (Appointment) with Lelon Perla, MD.     Allergies:   Patient has no known allergies.   Social History   Tobacco Use  . Smoking status: Former Smoker    Packs/day: 2.00    Years: 20.00    Pack years: 40.00    Quit date: 04/04/2003    Years since quitting: 16.8  . Smokeless tobacco: Never Used  Vaping Use  . Vaping Use: Never used  Substance Use Topics  . Alcohol use: No  . Drug use: No     Family Hx: The patient's family history includes Heart attack in his father; Heart disease in his sister; Hypertension in his mother and another family member.  ROS:   Please see the history of present illness.    No Fever, chills  or productive cough All other systems reviewed and are negative.   Recent Labs: 02/15/2019: B Natriuretic Peptide 52.4 10/15/2019: ALT 28; BUN 15; Creatinine, Ser 1.09; Hemoglobin 16.0; Magnesium 2.0; Platelets 293; Potassium 4.5; Sodium 137; TSH 2.130   Recent Lipid Panel Lab Results  Component Value Date/Time   CHOL 119 10/09/2019 09:59 AM   TRIG 59 10/09/2019 09:59 AM   HDL 32 (L) 10/09/2019 09:59 AM   CHOLHDL 3.7 10/09/2019 09:59 AM   CHOLHDL 3.9 01/23/2019 05:15 AM   LDLCALC 74 10/09/2019 09:59 AM    Wt Readings from Last 3 Encounters:  10/15/19 263 lb 6.4 oz (119.5 kg)  10/14/19 261 lb (118.4 kg)  10/09/19 260 lb (117.9 kg)     Objective:    Vital Signs:  There were no vitals taken for this visit.   VITAL SIGNS:  reviewed NAD Answers questions appropriately Normal affect Remainder of physical examination not performed (telehealth visit; coronavirus pandemic)  ASSESSMENT & PLAN:    1. Disease-patient denies chest pain.  Continue medical therapy with Plavix, statin and beta-blocker. 2. Hypertension-patient's blood pressure is controlled.  Continue present medical regimen.  Check  potassium and renal function. 3. Hyperlipidemia-continue statin.  Check lipids and liver. 4. Ischemic cardiomyopathy-continue beta-blocker and lisinopril.  LV function improved on most recent echocardiogram.  He did not tolerate Entresto previously. 5. Paroxysmal atrial fibrillation-patient remains in sinus rhythm based on history.  Continue beta-blocker and apixaban.  Check hemoglobin and renal function. 6. Peripheral vascular disease-status post stent to right iliac.  Claudication has improved.  Continue Plavix and statin. 7. Tobacco abuse-patient states he has discontinued. 8. Prior ICD-followed by electrophysiology.  COVID-19 Education: The importance of social distancing was discussed today.  Time:   Today, I have spent 16 minutes with the patient with telehealth technology discussing the above problems.     Medication Adjustments/Labs and Tests Ordered: Current medicines are reviewed at length with the patient today.  Concerns regarding medicines are outlined above.   Tests Ordered: No orders of the defined types  were placed in this encounter.   Medication Changes: No orders of the defined types were placed in this encounter.   Follow Up:  In Person in 6 month(s)  Signed, Kirk Ruths, MD  01/28/2020 2:05 PM    [place]

## 2020-01-29 ENCOUNTER — Telehealth: Payer: Medicare Other | Admitting: Cardiology

## 2020-02-05 ENCOUNTER — Telehealth: Payer: Medicare Other | Admitting: Cardiology

## 2020-02-05 ENCOUNTER — Other Ambulatory Visit: Payer: Self-pay | Admitting: Cardiology

## 2020-02-05 DIAGNOSIS — Z951 Presence of aortocoronary bypass graft: Secondary | ICD-10-CM

## 2020-02-09 ENCOUNTER — Other Ambulatory Visit: Payer: Self-pay | Admitting: Student

## 2020-02-09 ENCOUNTER — Other Ambulatory Visit: Payer: Self-pay | Admitting: Cardiology

## 2020-02-09 DIAGNOSIS — I10 Essential (primary) hypertension: Secondary | ICD-10-CM

## 2020-02-11 ENCOUNTER — Ambulatory Visit (HOSPITAL_COMMUNITY)
Admission: RE | Admit: 2020-02-11 | Payer: Medicare Other | Source: Ambulatory Visit | Attending: Cardiovascular Disease | Admitting: Cardiovascular Disease

## 2020-02-11 ENCOUNTER — Ambulatory Visit (HOSPITAL_COMMUNITY): Admission: RE | Admit: 2020-02-11 | Payer: Medicare Other | Source: Ambulatory Visit

## 2020-02-11 ENCOUNTER — Encounter (HOSPITAL_COMMUNITY): Payer: Self-pay

## 2020-02-11 ENCOUNTER — Telehealth: Payer: Self-pay | Admitting: Internal Medicine

## 2020-02-11 NOTE — Telephone Encounter (Signed)
Pt c/o medication issue:  1. Name of Medication: bisoprolol (ZEBETA) 5 MG tablet  2. How are you currently taking this medication (dosage and times per day)? 1 tablet daily  3. Are you having a reaction (difficulty breathing--STAT)? no  4. What is your medication issue? Hassan Rowan, patient's wife states that she thought her husband was to be taking 0.5 tablet daily but her husband is telling her it is 1 tablet daily. She wants to know the correct instructions for this medication.

## 2020-02-11 NOTE — Telephone Encounter (Signed)
Spoke with pt's wife Hassan Rowan, [place] and advised pt is to be taking Bisoprolol 5mg  - 1 tablet by mouth daily.  Pt's wife verbalizes understanding and thanked Therapist, sports for the call.

## 2020-02-23 ENCOUNTER — Telehealth: Payer: Self-pay | Admitting: Cardiology

## 2020-02-23 MED ORDER — CLOPIDOGREL BISULFATE 75 MG PO TABS: 75.0000 mg | ORAL_TABLET | Freq: Every day | ORAL | 3 refills | Status: DC

## 2020-02-23 NOTE — Telephone Encounter (Signed)
*  STAT* If patient is at the pharmacy, call can be transferred to refill team.   1. Which medications need to be refilled? (please list name of each medication and dose if known) clopidogrel (PLAVIX) 75 MG tablet  2. Which pharmacy/location (including street and city if local pharmacy) is medication to be sent to? [id number] - [place], [place] - [address]  3. Do they need a 30 day or 90 day supply? 90 day   Patient is out of medication

## 2020-02-23 NOTE — Telephone Encounter (Signed)
Refill sent to the pharmacy electronically.  

## 2020-02-25 ENCOUNTER — Ambulatory Visit (INDEPENDENT_AMBULATORY_CARE_PROVIDER_SITE_OTHER): Payer: Medicare Other

## 2020-02-25 DIAGNOSIS — I4901 Ventricular fibrillation: Secondary | ICD-10-CM | POA: Diagnosis not present

## 2020-02-26 LAB — CUP PACEART REMOTE DEVICE CHECK
Battery Remaining Longevity: 22 mo
Battery Remaining Percentage: 17 %
Battery Voltage: 2.75 V
Brady Statistic RV Percent Paced: 1 %
Date Time Interrogation Session: 20211124030211
HighPow Impedance: 58 Ohm
Implantable Lead Implant Date: 20101111
Implantable Lead Location: 753860
Implantable Lead Model: 7121
Implantable Pulse Generator Implant Date: 20101111
Lead Channel Impedance Value: 540 Ohm
Lead Channel Pacing Threshold Amplitude: 1 V
Lead Channel Pacing Threshold Pulse Width: 0.5 ms
Lead Channel Sensing Intrinsic Amplitude: 12 mV
Lead Channel Setting Pacing Amplitude: 2.5 V
Lead Channel Setting Pacing Pulse Width: 0.5 ms
Lead Channel Setting Sensing Sensitivity: 0.5 mV
Pulse Gen Serial Number: 743367

## 2020-03-03 NOTE — Progress Notes (Signed)
Remote ICD transmission.   

## 2020-03-05 ENCOUNTER — Other Ambulatory Visit: Payer: Self-pay | Admitting: Cardiology

## 2020-03-08 ENCOUNTER — Other Ambulatory Visit: Payer: Self-pay | Admitting: General Practice

## 2020-03-08 ENCOUNTER — Other Ambulatory Visit: Payer: Self-pay | Admitting: Cardiology

## 2020-03-23 ENCOUNTER — Ambulatory Visit: Payer: Medicare Other | Admitting: Dermatology

## 2020-03-29 ENCOUNTER — Telehealth: Payer: Self-pay

## 2020-03-29 ENCOUNTER — Other Ambulatory Visit: Payer: Self-pay

## 2020-03-29 MED ORDER — PANTOPRAZOLE SODIUM 40 MG PO TBEC
40.0000 mg | DELAYED_RELEASE_TABLET | Freq: Every day | ORAL | 1 refills | Status: DC
Start: 2020-03-29 — End: 2020-04-23

## 2020-03-29 NOTE — Telephone Encounter (Signed)
Merlin alert received for VF event, 03/24/20 @ 23:12 PM,  duration 26 seconds, 1 unsuccessful and 1 successful shock complete. Patient states he was in his house decorating and cleaning his house. States he felt fine prior to shock and post shock. States the only symptom he noted was nausea. Was unaware ICD shocked him twice. Reports he has felt well all weekend and has no complaints today. Reports compliance with all medications including Coreg 25 mg BID, Eliquis 5 mg BID, Lasix 20 mg daily, Imdur 30 mg daily. Has follow up with Dr. Graciela Husbands 06/2020. Advised I will forward to Dr. Graciela Husbands and we will call with changes. Agreeable to plan.  Shock Plan reviewed with patient. Patient advised per [place] DMV, advised not to drive for 6 months starting 03/24/20. Verbalized understanding.

## 2020-03-30 NOTE — Telephone Encounter (Signed)
Leigh Thanks  I would describe this event as vt/VF with successful conversion and rapid reoccurence >> both time, the first sustaining requiring a second shock and the second stopping spontaneously Notice that all three were long short initiated so should check K and Mg and get an ECG  Thanks SK

## 2020-03-31 ENCOUNTER — Telehealth: Payer: Self-pay | Admitting: Internal Medicine

## 2020-03-31 NOTE — Telephone Encounter (Signed)
Attempted phone call to pt to schedule appointment with APP for f/u vt/VF with shock  labs (K and Mg) and EKG.  Left voicemail for pt to contact [facility] at [phone].  Will forward to scheduler, [place] to schedule appointment with pt.

## 2020-03-31 NOTE — Telephone Encounter (Signed)
     I went in pt's chart to see who called him. The call was transferred to [facility]

## 2020-04-01 ENCOUNTER — Other Ambulatory Visit: Payer: Self-pay | Admitting: Physician Assistant

## 2020-04-01 ENCOUNTER — Other Ambulatory Visit: Payer: Self-pay

## 2020-04-01 ENCOUNTER — Encounter: Payer: Self-pay | Admitting: Physician Assistant

## 2020-04-01 ENCOUNTER — Ambulatory Visit (INDEPENDENT_AMBULATORY_CARE_PROVIDER_SITE_OTHER): Payer: Medicare Other | Admitting: Physician Assistant

## 2020-04-01 VITALS — BP 120/80 | HR 58 | Ht 71.0 in | Wt 260.0 lb

## 2020-04-01 DIAGNOSIS — Z95810 Presence of automatic (implantable) cardiac defibrillator: Secondary | ICD-10-CM | POA: Diagnosis not present

## 2020-04-01 DIAGNOSIS — I2510 Atherosclerotic heart disease of native coronary artery without angina pectoris: Secondary | ICD-10-CM | POA: Diagnosis not present

## 2020-04-01 DIAGNOSIS — I5022 Chronic systolic (congestive) heart failure: Secondary | ICD-10-CM

## 2020-04-01 DIAGNOSIS — I472 Ventricular tachycardia, unspecified: Secondary | ICD-10-CM

## 2020-04-01 DIAGNOSIS — I255 Ischemic cardiomyopathy: Secondary | ICD-10-CM

## 2020-04-01 DIAGNOSIS — I10 Essential (primary) hypertension: Secondary | ICD-10-CM | POA: Diagnosis not present

## 2020-04-01 MED ORDER — MEXILETINE HCL 150 MG PO CAPS: 150.0000 mg | ORAL_CAPSULE | Freq: Two times a day (BID) | ORAL | 3 refills | Status: DC

## 2020-04-01 NOTE — Progress Notes (Signed)
Cardiology Office Note Date:  04/01/2020  Patient ID:  Billy, [name] [birth year], MRN [id number] PCP:  Nicolette Bang, DO  Cardiologist:  Dr. Stanford Breed PVD: Dr. Fletcher Anon Electrophysiologist: Dr. Caryl Comes    Chief Complaint:  VT, ICD shock  History of Present Illness: Billy [name] is a 54 y.o. male with history of VF arrest w/ICD, CAD (remote CABG >> Oct 2020 PCI to Cx), HTN, HLD,  smoker, AFib, PVD s/p SFA stent (05/2019)   He comes in today to be seen for Dr. Caryl Comes, last seen by him March 2021, noted Jan 2021 that he while raking leaves developed an SVT that was  inappropriately diagnosed and treated with ATP accelerating to VT requiring shock.  He added a second beta-blocker,  low-dose bisoprolol to his carvedilol  He saw Dr. Fletcher Anon with improved claudication sympotoms  Device alert received for appropriate tx 03/24/20, in reviwed by Dr. Caryl Comes, "I would describe this event as vt/VF with successful conversion and rapid reoccurence >> both time, the first sustaining requiring a second shock and the second stopping spontaneously Notice that all three were long short initiated so should check K and Mg and get an ECG"   TODAY He is doing well. He denies any kin of CP, palpitations or cardiac awareness. Says the shock came out of nowhere, did not have syncope.   He was moving the washing machine alone and was heavy and he was working hard and suddenly got shocked. No dizziness, weakness, no CP, SOB. He was alarmed by the shock felt a little nauseous but resolved quickly and felt well. He reports his angina as CP strong indigestion,, gets SOB and has associated arm pain and numbness as well prior to his CABG and his last PCI. He has had no ne of these. He denies SOB, DOE He is taking his medicines and has not missed any.  No bleeding or signs of bleeding    Device information: SJM single chamber ICD implanted 02/11/09 + hx of appropriate therapy,  VF in 2016 Jan 2021  SVT accelerated into VT with ATP requiring ICD shock Dec 2021 VT/VF  Past Medical History:  Diagnosis Date  . Aortic atherosclerosis ([place])    on CXR  . Blood in stool   . C. difficile colitis    a. remote hx 2010.  . Cardiac arrest - ventricular fibrillation 02/11/2009   a. 02/2009 s/p St Jude ICD.  [name] Chronic systolic CHF (congestive heart failure) ([place])   . Coronary artery disease    a. PCI to Cx age 12. b. CABGx4 in 2003. c. BMS to [place] in 12/2007.  [name] Former tobacco use   . Hyperlipidemia   . Hypertension   . Ischemic cardiomyopathy   . Marijuana abuse   . Myocardial infarct ([name])    NON-ST-SEGMENT ELEVATION MI  . Obesity   . PAD (peripheral artery disease) ([place])    a. RLE by noninvasive testing - managed medically for now.  . Ventricular tachyarrhythmia ([place])   . Ventricular tachycardia [facility])     Past Surgical History:  Procedure Laterality Date  . ABDOMINAL AORTOGRAM W/LOWER EXTREMITY N/A 05/14/2019   Procedure: ABDOMINAL AORTOGRAM W/LOWER EXTREMITY;  Surgeon: Wellington Hampshire, MD;  Location: [place] [facility];  Service: Cardiovascular;  Laterality: N/A;  . CARDIAC DEFIBRILLATOR PLACEMENT     STJ single chamber ICD implanted for secondary prevention  . CIRCUMCISION    . CORONARY ARTERY BYPASS GRAFT  06/17/01   X 4  . CORONARY  BALLOON ANGIOPLASTY N/A 01/22/2019   Procedure: CORONARY BALLOON ANGIOPLASTY;  Surgeon: Leonie Man, MD;  Location: [place] [facility];  Service: Cardiovascular;  Laterality: N/A;  . LEFT HEART CATH AND CORS/GRAFTS ANGIOGRAPHY N/A 01/22/2019   Procedure: LEFT HEART CATH AND CORS/GRAFTS ANGIOGRAPHY;  Surgeon: Leonie Man, MD;  Location: [place] [facility];  Service: Cardiovascular;  Laterality: N/A;  . PERIPHERAL VASCULAR INTERVENTION Right 05/14/2019   Procedure: PERIPHERAL VASCULAR INTERVENTION;  Surgeon: Wellington Hampshire, MD;  Location: [place] [facility];  Service: Cardiovascular;  Laterality: Right;    Current Outpatient Medications   Medication Sig Dispense Refill  . atorvastatin (LIPITOR) 80 MG tablet TAKE 1 TABLET BY MOUTH EVERY DAY 90 tablet 3  . benzonatate (TESSALON) 100 MG capsule Take 1 capsule (100 mg total) by mouth 2 (two) times daily as needed for cough. 20 capsule 0  . bisoprolol (ZEBETA) 5 MG tablet Take 1 tablet (5 mg total) by mouth daily. 90 tablet 3  . carvedilol (COREG) 25 MG tablet TAKE 1 TABLET BY MOUTH TWICE A DAY 180 tablet 3  . clopidogrel (PLAVIX) 75 MG tablet Take 1 tablet (75 mg total) by mouth daily. 90 tablet 3  . ELIQUIS 5 MG TABS tablet TAKE 1 TABLET BY MOUTH TWICE A DAY 180 tablet 3  . ezetimibe (ZETIA) 10 MG tablet TAKE 1 TABLET BY MOUTH EVERY DAY 90 tablet 3  . fluticasone (FLONASE) 50 MCG/ACT nasal spray Place 2 sprays into both nostrils daily as needed for allergies or rhinitis. 16 g 11  . furosemide (LASIX) 20 MG tablet TAKE 1 TABLET BY MOUTH EVERY DAY 90 tablet 3  . isosorbide mononitrate (IMDUR) 30 MG 24 hr tablet TAKE 1 TABLET BY MOUTH EVERY DAY 90 tablet 3  . lisinopril (ZESTRIL) 2.5 MG tablet TAKE 1 TABLET BY MOUTH EVERY DAY 90 tablet 2  . mexiletine (MEXITIL) 150 MG capsule Take 1 capsule (150 mg total) by mouth 2 (two) times daily. 180 capsule 3  . montelukast (SINGULAIR) 10 MG tablet Take 1 tablet (10 mg total) by mouth daily as needed (allergies). 90 tablet 1  . Multiple Vitamin (MULTIVITAMIN WITH MINERALS) TABS tablet Take 1 tablet by mouth daily.    . nitroGLYCERIN (NITROSTAT) 0.4 MG SL tablet PLACE 1 TABLET (0.4 MG TOTAL) UNDER THE TONGUE EVERY 5 (FIVE) MINUTES AS NEEDED FOR CHEST PAIN. 75 tablet 2  . pantoprazole (PROTONIX) 40 MG tablet Take 1 tablet (40 mg total) by mouth daily. 90 tablet 1  . potassium chloride SA (KLOR-CON M20) 20 MEQ tablet Take 1 tablet (20 mEq total) by mouth daily. 90 tablet 3   No current facility-administered medications for this visit.    Allergies:   Patient has no known allergies.   Social History:  The patient  reports that he quit smoking  about 17 years ago. He has a 40.00 pack-year smoking history. He has never used smokeless tobacco. He reports that he does not drink alcohol and does not use drugs.   Family History:  The patient's family history includes Heart attack in his father; Heart disease in his sister; Hypertension in his mother and another family member.   ROS:  Please see the history of present illness.  All other systems are reviewed and otherwise negative.   PHYSICAL EXAM:  VS:  BP 120/80   Pulse (!) 58   Ht 5\' 11"  (1.803 m)   Wt 260 lb (117.9 kg)   SpO2 96%   BMI 36.26 kg/m  BMI: Body mass index is 36.26 kg/m. Well nourished, well developed, in no acute distress  HEENT: normocephalic, atraumatic  Neck: no JVD, carotid bruits or masses Cardiac:   RRR; no significant murmurs, no rubs, or gallops Lungs: CTA b/l, no wheezing, rhonchi or rales  Abd: soft, nontender MS: no deformity or atrophy Ext: no edema  Skin: warm and dry, no rash Neuro:  No gross deficits appreciated Psych: euthymic mood, full affect  ICD site is stable, no tethering or discomfort   EKG:  Done today and reviewed by  myself  SB 58, no ST/T changes, no changes from prior  ICD interrogation done today and reviewed by myself:  Battery and lead measurements are good One VF episode, 03/24/20 2 shocks , 1st restored a beat or 2 of SR >> PMVT >MMVT > shock again with a few sinus beats >NSVT 7 beats and broke    05/14/2019: Periphral vasc intervention 1.  No significant aortic disease. 2.  No significant iliac disease on the left side. 3.  Right lower extremity: Severe calcified stenosis affecting the proximal external iliac artery, diffusely diseased SFA with occlusion in the mid segment with reconstitution distally via collaterals from the profunda.  Subtotal occlusion of the posterior tibial artery with very well developed collaterals and overall three-vessel runoff below the knee. 4.  Successful self-expanding stent placement to  the right external iliac artery.  Recommendations: No aspirin given that the patient is on anticoagulation with Eliquis.  Eliquis can be resumed tomorrow if no bleeding issues. The patient is already on clopidogrel which should be continued. Continue aggressive treatment of risk factors.    01/23/2019: TTE IMPRESSIONS  1. Left ventricular ejection fraction, by visual estimation, is 50 to  55%. The left ventricle has mildly decreased function. Moderately  increased left ventricular size. There is no left ventricular hypertrophy.  2. Mild inferior and apical hypokinesis  3. Left ventricular diastolic Doppler parameters are consistent with  impaired relaxation pattern of LV diastolic filling.  4. Global right ventricle has mildly reduced systolic function.The right  ventricular size is normal. No increase in right ventricular wall  thickness.  5. Left atrial size was normal.  6. Right atrial size was normal.  7. The mitral valve is grossly normal. Trace mitral valve regurgitation.  8. The tricuspid valve is grossly normal. Tricuspid valve regurgitation  was not visualized by color flow Doppler.  9. The aortic valve is tricuspid Aortic valve regurgitation was not  visualized by color flow Doppler.  10. The pulmonic valve was grossly normal. Pulmonic valve regurgitation is  not visualized by color flow Doppler.  11. The inferior vena cava is normal in size with greater than 50%  respiratory variability, suggesting right atrial pressure of 3 mmHg.    October 2020.   Cardiac catheterrization showed severe native 2-vessel coronary artery disease.  SVG to RCA and SVG to OM 2 were known to be occluded.  LIMA to LAD was found to be atretic with competitive flow due to minimal disease in the native LAD.  SVG to first diagonal was patent.  There was 80% ostial left circumflex stenosis which was treated with scoring balloon angioplasty.    04/18/12: Lexiscan stress Impression Exercise  Capacity:  Lexiscan with no exercise. BP Response:  Hypotensive blood pressure response. Clinical Symptoms:  Chest tightness, nausea, dizziness.  ECG Impression:  No significant ST segment change suggestive of ischemia. Comparison with Prior Nuclear Study: Similar to report of previous study. Overall Impression:  Intermediate stress nuclear study.  Medium-sized, severe partially reversible basal to mid inferolateral and basal inferior perfusion defect suggestive of infarction with peri-infarct ischemia.  LV Ejection Fraction: 38%.  LV Wall Motion:  Inferolateral hypokinesis.    02/09/09: TTE Study Conclusions 1. Left ventricle: The cavity size was mildly dilated. Wall  thickness was normal. Systolic function was mildly to moderately  reduced. The estimated ejection fraction was in the range of 40%  to 45%. There is akinesis of the basal-mid inferoposterior  myocardium. 2. Mitral valve: Mild regurgitation. 3. Left atrium: The atrium was mildly dilated. 4. Right atrium: The atrium was mildly dilated. Impressions: - Limited study to assess LV function; full doppler study not  performed.    Recent Labs: 10/15/2019: ALT 28; BUN 15; Creatinine, Ser 1.09; Hemoglobin 16.0; Magnesium 2.0; Platelets 293; Potassium 4.5; Sodium 137; TSH 2.130  10/09/2019: Chol/HDL Ratio 3.7; Cholesterol, Total 119; HDL 32; LDL Chol Calc (NIH) 74; Triglycerides 59   CrCl cannot be calculated (Patient's most recent lab result is older than the maximum 21 days allowed.).   Wt Readings from Last 3 Encounters:  04/01/20 260 lb (117.9 kg)  10/15/19 263 lb 6.4 oz (119.5 kg)  10/14/19 261 lb (118.4 kg)     Other studies reviewed: Additional studies/records reviewed today include: summarized above  ASSESSMENT AND PLAN:  1. ICD     Intact function, no changes made  2. ICM, chronic CHF     Recovered LVEF by echo last year     Cor Vue is at baseline trending up     No symptoms or exam  findings of volume OL     On BB/ACE, lasix/K+, imdur  3. CAD     No anginal complaints     On plavix, BB and high dose statin, setia      C/w Dr. Jens Som has been over a year  4. HTN      No changes today  5. VT      VT/VF, seen by Dr. Graciela Husbands.      Will get BMET and mag today      I will start mexiletine 150mg  BID, should he have a significant electrolyte abnormality, will have him stop.       We discussed no driving for 74mo, he was aware    Disposition:  See him back in a month, sooner if needed.   Current medicines are reviewed at length with the patient today.  The patient did not have any concerns regarding medicines.  5mo, PA-C 04/01/2020 5:14 PM     [facility] [address] [place] [id number] (office)  [phone] (fax)

## 2020-04-01 NOTE — Patient Instructions (Addendum)
Medication Instructions:  Your physician has recommended you make the following change in your medication:  -- START Mexiletine 150 mg - Take 1 tablet (150 mg) by mouth twice daily -- RX SENT *If you need a refill on your cardiac medications before your next appointment, please call your pharmacy*  Lab Work: Your physician has recommended that you have lab work today: BMET and Magnesium Level  If you have labs (blood work) drawn today and your tests are completely normal, you will receive your results only by: [name] MyChart Message (if you have MyChart) OR . A paper copy in the mail If you have any lab test that is abnormal or we need to change your treatment, we will call you to review the results.  Follow-Up: At [facility], you and your health needs are our priority.  As part of our continuing mission to provide you with exceptional heart care, we have created designated Provider Care Teams.  These Care Teams include your primary Cardiologist (physician) and Advanced Practice Providers (APPs -  Physician Assistants and Nurse Practitioners) who all work together to provide you with the care you need, when you need it.  We recommend signing up for the patient portal called "MyChart".  Sign up information is provided on this After Visit Summary.  MyChart is used to connect with patients for Virtual Visits (Telemedicine).  Patients are able to view lab/test results, encounter notes, upcoming appointments, etc.  Non-urgent messages can be sent to your provider as well.   To learn more about what you can do with MyChart, go to [email or web address].    Your next appointment:   Your physician recommends that you schedule a follow-up appointment in: 1 MONTH with Dr. Graciela Husbands or an EP APP.  The format for your next appointment:   In Person with You may see Sherryl Manges, MD or one of the following Advanced Practice Providers on your designated Care Team:    Gypsy Balsam, NP  Francis Dowse,  PA-C  Casimiro Needle "[place]" [place], [place]  -- You are not allowed to drive or operate a motor vehicle for 6 months due to your ICD shock --

## 2020-04-02 LAB — BASIC METABOLIC PANEL
BUN/Creatinine Ratio: 11 (ref 9–20)
BUN: 13 mg/dL (ref 6–24)
CO2: 25 mmol/L (ref 20–29)
Calcium: 9.6 mg/dL (ref 8.7–10.2)
Chloride: 105 mmol/L (ref 96–106)
Creatinine, Ser: 1.2 mg/dL (ref 0.76–1.27)
GFR calc Af Amer: 79 mL/min/{1.73_m2} (ref >59)
GFR calc non Af Amer: 69 mL/min/{1.73_m2} (ref >59)
Glucose: 91 mg/dL (ref 65–99)
Potassium: 4.8 mmol/L (ref 3.5–5.2)
Sodium: 140 mmol/L (ref 134–144)

## 2020-04-02 LAB — MAGNESIUM: Magnesium: 2 mg/dL (ref 1.6–2.3)

## 2020-04-06 ENCOUNTER — Telehealth: Payer: Self-pay | Admitting: Physician Assistant

## 2020-04-06 ENCOUNTER — Other Ambulatory Visit: Payer: Self-pay | Admitting: *Deleted

## 2020-04-06 DIAGNOSIS — Z01818 Encounter for other preprocedural examination: Secondary | ICD-10-CM

## 2020-04-06 DIAGNOSIS — I10 Essential (primary) hypertension: Secondary | ICD-10-CM

## 2020-04-06 DIAGNOSIS — I2510 Atherosclerotic heart disease of native coronary artery without angina pectoris: Secondary | ICD-10-CM

## 2020-04-06 MED ORDER — ISOSORBIDE MONONITRATE ER 60 MG PO TB24: 60.0000 mg | ORAL_TABLET | Freq: Every day | ORAL | 3 refills | Status: DC

## 2020-04-06 NOTE — Telephone Encounter (Signed)
Patient's wife returning call. 

## 2020-04-06 NOTE — Telephone Encounter (Signed)
Called and spoke with the patient and his wife. The patient identifying himself by birthdate.  They mention his insurance had changed the pharmacy ahd the old company and were hoping the medicine would be covered.  Though I told them I had opportunity to d/w Dr. Graciela Husbands and his recommendations to hold off on the mexiletine and increase his imdur to 60mg  daily.  We also discussed hius recommendation for cath to look for any progression of his CAD. He is familiar with the procedure, though we revisited the cath procedure, potential risks and benefits. He has not had any CP or symptoms, no syncope or shocks. He does mention he has a cold and would like to get passed that before the cath, though they are agreeable to proceed. I advised them that my MA will call him to schedule pre-procedure labs/COVID testing and the procedure.  , PA-C

## 2020-04-06 NOTE — Telephone Encounter (Signed)
I got a Rx request and appears mexiletine is not covered. I reached out to Dr. Graciela Husbands, in further discussion with him and review of his case.   He thinks we should proceed with LHC despite no anginal symptoms or EKG changes D/c mexiletine and increase his Imdur.  I called the patient, left a message on his listed number to call the office back in regards to his medications and plan of care.  Francis Dowse, PA-C

## 2020-04-07 ENCOUNTER — Telehealth: Payer: Self-pay | Admitting: *Deleted

## 2020-04-07 NOTE — Telephone Encounter (Signed)
Attempted patient twice while in clinic to check on status of cold to set up for labs screen and cath as Francis Dowse recommended  Lvm for patient to call back as soon as possible

## 2020-04-07 NOTE — Addendum Note (Signed)
Addended by: Oleta Mouse on: 04/07/2020 04:32 PM   Modules accepted: Orders

## 2020-04-12 ENCOUNTER — Other Ambulatory Visit: Payer: Self-pay

## 2020-04-12 ENCOUNTER — Ambulatory Visit: Payer: Medicare Other | Admitting: Internal Medicine

## 2020-04-12 ENCOUNTER — Ambulatory Visit (INDEPENDENT_AMBULATORY_CARE_PROVIDER_SITE_OTHER): Payer: Medicare HMO | Admitting: Family

## 2020-04-12 ENCOUNTER — Encounter: Payer: Self-pay | Admitting: Family

## 2020-04-12 ENCOUNTER — Encounter: Payer: Self-pay | Admitting: *Deleted

## 2020-04-12 ENCOUNTER — Telehealth: Payer: Self-pay | Admitting: *Deleted

## 2020-04-12 VITALS — BP 124/77 | HR 61 | Wt 263.6 lb

## 2020-04-12 DIAGNOSIS — J069 Acute upper respiratory infection, unspecified: Secondary | ICD-10-CM

## 2020-04-12 DIAGNOSIS — Z2821 Immunization not carried out because of patient refusal: Secondary | ICD-10-CM | POA: Diagnosis not present

## 2020-04-12 DIAGNOSIS — I10 Essential (primary) hypertension: Secondary | ICD-10-CM

## 2020-04-12 DIAGNOSIS — Z1211 Encounter for screening for malignant neoplasm of colon: Secondary | ICD-10-CM | POA: Diagnosis not present

## 2020-04-12 DIAGNOSIS — L729 Follicular cyst of the skin and subcutaneous tissue, unspecified: Secondary | ICD-10-CM

## 2020-04-12 MED ORDER — BENZONATATE 100 MG PO CAPS
100.0000 mg | ORAL_CAPSULE | Freq: Two times a day (BID) | ORAL | 0 refills | Status: DC | PRN
Start: 2020-04-12 — End: 2020-06-15

## 2020-04-12 NOTE — Progress Notes (Signed)
Patient ID: Billy [name], male    DOB: 10-Apr-1965  MRN: [id number]  CC: Hypertension Follow-Up  Subjective: Billy [name] is a 55 y.o. male who presents for hypertension follow-up.  1. HYPERTENSION FOLLOW-UP: 10/09/2019: Visit with Dr. Juleen [name]. Blood pressure well controlled. Continued on current regimen. CBC and CMP obtained.   04/12/2020: Last appointment with Cardiology July 2021, next appointment 04/27/2020. Currently taking: see medication list Med Adherence: [x]  Yes    []  No Medication side effects: []  Yes    [x]  No Adherence with salt restriction: [x]  Yes    []  No Exercise: Yes [x]  No []  Home Monitoring?: []  Yes    [x]  No Monitoring Frequency: []  Yes    [x]  No Home BP results range: []  Yes    [x]  No Smoking []  Yes [x]  No SOB? []  Yes    [x]  No Chest Pain?: []  Yes    [x]  No Leg swelling?: []  Yes    [x]  No Headaches?: []  Yes    [x]  No Dizziness? []  Yes    [x]  No  Patient Active Problem List   Diagnosis Date Noted  . New onset atrial fibrillation ([place])   . Status post angioplasty 02/15/2019  . Unstable angina ([place]) 01/22/2019  . Coronary artery disease involving native coronary artery of native heart with unstable angina pectoris ([place]) 01/22/2019  . Irritable bowel syndrome 08/23/2017  . Seasonal allergies 04/19/2017  . S/P quadruple vessel bypass 04/19/2017  . Prediabetes 04/19/2017  . GERD (gastroesophageal reflux disease) 01/31/2016  . Implantable cardioverter-defibrillator (ICD) in situ 06/12/2011  . VENTRICULAR FIBRILLATION 03/02/2009  . Cardiomyopathy, ischemic 08/25/2008  . Hyperlipidemia 01/15/2008  . Essential hypertension 01/15/2008  . CORONARY ARTERY DISEASE 01/15/2008  . PERIPHERAL VASCULAR DISEASE 01/15/2008  . HEMATOCHEZIA 01/15/2008  . CHEST PAIN 01/15/2008     Current Outpatient Medications on File Prior to Visit  Medication Sig Dispense Refill  . atorvastatin (LIPITOR) 80 MG tablet TAKE 1 TABLET BY MOUTH EVERY DAY 90 tablet 3  . bisoprolol  (ZEBETA) 5 MG tablet Take 1 tablet (5 mg total) by mouth daily. 90 tablet 3  . carvedilol (COREG) 25 MG tablet TAKE 1 TABLET BY MOUTH TWICE A DAY 180 tablet 3  . clopidogrel (PLAVIX) 75 MG tablet Take 1 tablet (75 mg total) by mouth daily. 90 tablet 3  . ELIQUIS 5 MG TABS tablet TAKE 1 TABLET BY MOUTH TWICE A DAY 180 tablet 3  . ezetimibe (ZETIA) 10 MG tablet TAKE 1 TABLET BY MOUTH EVERY DAY 90 tablet 3  . fluticasone (FLONASE) 50 MCG/ACT nasal spray Place 2 sprays into both nostrils daily as needed for allergies or rhinitis. 16 g 11  . furosemide (LASIX) 20 MG tablet TAKE 1 TABLET BY MOUTH EVERY DAY 90 tablet 3  . isosorbide mononitrate (IMDUR) 60 MG 24 hr tablet Take 1 tablet (60 mg total) by mouth daily. 90 tablet 3  . lisinopril (ZESTRIL) 2.5 MG tablet TAKE 1 TABLET BY MOUTH EVERY DAY 90 tablet 2  . montelukast (SINGULAIR) 10 MG tablet Take 1 tablet (10 mg total) by mouth daily as needed (allergies). 90 tablet 1  . Multiple Vitamin (MULTIVITAMIN WITH MINERALS) TABS tablet Take 1 tablet by mouth daily.    . nitroGLYCERIN (NITROSTAT) 0.4 MG SL tablet PLACE 1 TABLET (0.4 MG TOTAL) UNDER THE TONGUE EVERY 5 (FIVE) MINUTES AS NEEDED FOR CHEST PAIN. 75 tablet 2  . pantoprazole (PROTONIX) 40 MG tablet Take 1 tablet (40 mg total) by mouth daily. 90 tablet 1  .  potassium chloride SA (KLOR-CON M20) 20 MEQ tablet Take 1 tablet (20 mEq total) by mouth daily. 90 tablet 3   No current facility-administered medications on file prior to visit.    No Known Allergies  Social History   Socioeconomic History  . Marital status: Married    Spouse name: Not on file  . Number of children: 3  . Years of education: 1  . Highest education level: Not on file  Occupational History  . Occupation: Disability  Tobacco Use  . Smoking status: Former Smoker    Packs/day: 2.00    Years: 20.00    Pack years: 40.00    Quit date: 04/04/2003    Years since quitting: 17.0  . Smokeless tobacco: Never Used  Vaping Use   . Vaping Use: Never used  Substance and Sexual Activity  . Alcohol use: No  . Drug use: No  . Sexual activity: Yes    Partners: Female  Other Topics Concern  . Not on file  Social History Narrative   MARRIED   FORMER TOBACCO USE, SMOKED FOR 20 YRS 2 PPD; QUIT IN 2005   NO ETOH   NO ILLICIT DRUG USE   NO REGULAR EXERCISE         ICD-ST. JUDE ; CERTIFIED LETTER SENT AND RETURNED BAD ADDRESS, PLS UPDATE DJW.      Fun: Physiological scientist Strain: Not on file  Food Insecurity: Not on file  Transportation Needs: Not on file  Physical Activity: Not on file  Stress: Not on file  Social Connections: Not on file  Intimate Partner Violence: Not on file    Family History  Problem Relation Age of Onset  . Hypertension Mother   . Heart attack Father        DIED AT 18 FROM MI  . Heart disease Sister        CAD AND PREVIOUS CABG  . Hypertension Other        IN MOST OF HIS SIBLINGS    Past Surgical History:  Procedure Laterality Date  . ABDOMINAL AORTOGRAM W/LOWER EXTREMITY N/A 05/14/2019   Procedure: ABDOMINAL AORTOGRAM W/LOWER EXTREMITY;  Surgeon: Wellington Hampshire, MD;  Location: [facility];  Service: Cardiovascular;  Laterality: N/A;  . CARDIAC DEFIBRILLATOR PLACEMENT     STJ single chamber ICD implanted for secondary prevention  . CIRCUMCISION    . CORONARY ARTERY BYPASS GRAFT  06/17/01   X 4  . CORONARY BALLOON ANGIOPLASTY N/A 01/22/2019   Procedure: CORONARY BALLOON ANGIOPLASTY;  Surgeon: Leonie Man, MD;  Location: [facility];  Service: Cardiovascular;  Laterality: N/A;  . LEFT HEART CATH AND CORS/GRAFTS ANGIOGRAPHY N/A 01/22/2019   Procedure: LEFT HEART CATH AND CORS/GRAFTS ANGIOGRAPHY;  Surgeon: Leonie Man, MD;  Location: [facility];  Service: Cardiovascular;  Laterality: N/A;  . PERIPHERAL VASCULAR INTERVENTION Right 05/14/2019   Procedure: PERIPHERAL VASCULAR INTERVENTION;  Surgeon: Wellington Hampshire, MD;  Location: [facility];  Service: Cardiovascular;  Laterality: Right;    ROS: Review of Systems Negative except as stated above  PHYSICAL EXAM: BP 124/77 (BP Location: Left Arm, Patient Position: Sitting)   Pulse 61   Wt 263 lb 9.6 oz (119.6 kg)   SpO2 98%   BMI 36.76 kg/m   Physical Exam Constitutional:      Appearance: He is obese.  HENT:     Head: Normocephalic.  Eyes:  Extraocular Movements: Extraocular movements intact.     Pupils: Pupils are equal, round, and reactive to light.  Cardiovascular:     Rate and Rhythm: Normal rate and regular rhythm.     Pulses: Normal pulses.     Heart sounds: Normal heart sounds.  Pulmonary:     Effort: Pulmonary effort is normal.     Breath sounds: Normal breath sounds.  Musculoskeletal:     Cervical back: Normal range of motion and neck supple.  Neurological:     General: No focal deficit present.     Mental Status: He is alert and oriented to person, place, and time.  Psychiatric:        Mood and Affect: Mood normal.        Behavior: Behavior normal.     ASSESSMENT AND PLAN: 1. Essential hypertension: - Blood pressure at goal during today's visit. Level of blood pressure control unknown as patient does not monitor at home.  - Continue regimen as prescribed.  - Keep all appointments with Cardiology.  - Follow-up with primary physician in 6 months or sooner if needed.  2. Viral URI with cough: - Presents in clinic today with a cough x 1 week with some nasal congestion. Says he caught a cold from his grandchildren. Not taking any over-the-counter medications.  - Benzonatate capsules as prescribed for cough.  - Follow-up in 1 week or sooner if needed if cough persists and/or worsens. - benzonatate (TESSALON) 100 MG capsule; Take 1 capsule (100 mg total) by mouth 2 (two) times daily as needed for cough.  Dispense: 20 capsule; Refill: 0  3. Scalp cyst: - Patient reports appointment scheduled with  Dermatology in February 2022.  4. Colon cancer screening: - Patient reports he is on hold for colon cancer screening and awaiting clearance from Cardiology.  5. Influenza vaccine refused: - Declines flu vaccine today until feeling better.  Patient was given the opportunity to ask questions.  Patient verbalized understanding of the plan and was able to repeat key elements of the plan. Patient was given clear instructions to go to Emergency Department or return to medical center if symptoms don't improve, worsen, or new problems develop.The patient verbalized understanding.   No orders of the defined types were placed in this encounter.    Requested Prescriptions   Signed Prescriptions Disp Refills  . benzonatate (TESSALON) 100 MG capsule 20 capsule 0    Sig: Take 1 capsule (100 mg total) by mouth 2 (two) times daily as needed for cough.    Return in about 6 months (around 10/10/2020) for Dr. Juleen [name].  Camillia Herter, NP

## 2020-04-12 NOTE — Progress Notes (Signed)
6 month f/u Wants flu vaccine but has cold symptoms started last week

## 2020-04-12 NOTE — Patient Instructions (Signed)
Continue all high blood pressure medications.   Keep all appointments with Cardiology.   Return for nurse visit for flu vaccine when feeling well.  Tessalon perles for cough.   Follow-up with primary physician in 6 months or sooner if needed.  Hypertension, Adult Hypertension is another name for high blood pressure. High blood pressure forces your heart to work harder to pump blood. This can cause problems over time. There are two numbers in a blood pressure reading. There is a top number (systolic) over a bottom number (diastolic). It is best to have a blood pressure that is below 120/80. Healthy choices can help lower your blood pressure, or you may need medicine to help lower it. What are the causes? The cause of this condition is not known. Some conditions may be related to high blood pressure. What increases the risk?  Smoking.  Having type 2 diabetes mellitus, high cholesterol, or both.  Not getting enough exercise or physical activity.  Being overweight.  Having too much fat, sugar, calories, or salt (sodium) in your diet.  Drinking too much alcohol.  Having long-term (chronic) kidney disease.  Having a family history of high blood pressure.  Age. Risk increases with age.  Race. You may be at higher risk if you are African American.  Gender. Men are at higher risk than women before age 69. After age 72, women are at higher risk than men.  Having obstructive sleep apnea.  Stress. What are the signs or symptoms?  High blood pressure may not cause symptoms. Very high blood pressure (hypertensive crisis) may cause: ? Headache. ? Feelings of worry or nervousness (anxiety). ? Shortness of breath. ? Nosebleed. ? A feeling of being sick to your stomach (nausea). ? Throwing up (vomiting). ? Changes in how you see. ? Very bad chest pain. ? Seizures. How is this treated?  This condition is treated by making healthy lifestyle changes, such as: ? Eating healthy  foods. ? Exercising more. ? Drinking less alcohol.  Your health care provider may prescribe medicine if lifestyle changes are not enough to get your blood pressure under control, and if: ? Your top number is above 130. ? Your bottom number is above 80.  Your personal target blood pressure may vary. Follow these instructions at home: Eating and drinking  If told, follow the DASH eating plan. To follow this plan: ? Fill one half of your plate at each meal with fruits and vegetables. ? Fill one fourth of your plate at each meal with whole grains. Whole grains include whole-wheat pasta, brown rice, and whole-grain bread. ? Eat or drink low-fat dairy products, such as skim milk or low-fat yogurt. ? Fill one fourth of your plate at each meal with low-fat (lean) proteins. Low-fat proteins include fish, chicken without skin, eggs, beans, and tofu. ? Avoid fatty meat, cured and processed meat, or chicken with skin. ? Avoid pre-made or processed food.  Eat less than 1,500 mg of salt each day.  Do not drink alcohol if: ? Your doctor tells you not to drink. ? You are pregnant, may be pregnant, or are planning to become pregnant.  If you drink alcohol: ? Limit how much you use to:  0-1 drink a day for women.  0-2 drinks a day for men. ? Be aware of how much alcohol is in your drink. In the [place] one drink equals one 12 oz bottle of beer (355 mL), one 5 oz glass of wine (148 mL), or one  1 oz glass of hard liquor (44 mL).   Lifestyle  Work with your doctor to stay at a healthy weight or to lose weight. Ask your doctor what the best weight is for you.  Get at least 30 minutes of exercise most days of the week. This may include walking, swimming, or biking.  Get at least 30 minutes of exercise that strengthens your muscles (resistance exercise) at least 3 days a week. This may include lifting weights or doing Pilates.  Do not use any products that contain nicotine or tobacco, such as  cigarettes, e-cigarettes, and chewing tobacco. If you need help quitting, ask your doctor.  Check your blood pressure at home as told by your doctor.  Keep all follow-up visits as told by your doctor. This is important.   Medicines  Take over-the-counter and prescription medicines only as told by your doctor. Follow directions carefully.  Do not skip doses of blood pressure medicine. The medicine does not work as well if you skip doses. Skipping doses also puts you at risk for problems.  Ask your doctor about side effects or reactions to medicines that you should watch for. Contact a doctor if you:  Think you are having a reaction to the medicine you are taking.  Have headaches that keep coming back (recurring).  Feel dizzy.  Have swelling in your ankles.  Have trouble with your vision. Get help right away if you:  Get a very bad headache.  Start to feel mixed up (confused).  Feel weak or numb.  Feel faint.  Have very bad pain in your: ? Chest. ? Belly (abdomen).  Throw up more than once.  Have trouble breathing. Summary  Hypertension is another name for high blood pressure.  High blood pressure forces your heart to work harder to pump blood.  For most people, a normal blood pressure is less than 120/80.  Making healthy choices can help lower blood pressure. If your blood pressure does not get lower with healthy choices, you may need to take medicine. This information is not intended to replace advice given to you by your health care provider. Make sure you discuss any questions you have with your health care provider. Document Revised: 11/28/2017 Document Reviewed: 11/28/2017 Elsevier Patient Education  2021 [employer].

## 2020-04-12 NOTE — Telephone Encounter (Signed)
Spoke with wife and got dates 68 or 72 to set up dates for Cath on Friday. Patient wife stated she uses  [facility] can send information through there.

## 2020-04-13 ENCOUNTER — Other Ambulatory Visit: Payer: Self-pay

## 2020-04-13 ENCOUNTER — Telehealth: Payer: Self-pay | Admitting: Internal Medicine

## 2020-04-13 ENCOUNTER — Telehealth: Payer: Self-pay

## 2020-04-13 MED ORDER — LISINOPRIL 2.5 MG PO TABS: 2.5000 mg | ORAL_TABLET | Freq: Every day | ORAL | 2 refills | Status: DC

## 2020-04-13 MED ORDER — POTASSIUM CHLORIDE CRYS ER 20 MEQ PO TBCR: 20.0000 meq | EXTENDED_RELEASE_TABLET | Freq: Every day | ORAL | 3 refills | Status: DC

## 2020-04-13 NOTE — Telephone Encounter (Signed)
*  STAT* If patient is at the pharmacy, call can be transferred to refill team.   1. Which medications need to be refilled? (please list name of each medication and dose if known) lisinopril (ZESTRIL) 2.5 MG tablet  2. Which pharmacy/location (including street and city if local pharmacy) is medication to be sent to? [id number] [place], [place] - [place].  3. Do they need a 30 day or 90 day supply? 90 day supply

## 2020-04-13 NOTE — Telephone Encounter (Signed)
[facility] to confirm which [facility]orbide rx was dispensed to patient phone call. Sp[facility] who confirmed pt received isosorbide 30 mg - 2 tabs daily from a refill request for a prescription dated in April. Was told that patient's prescription for isosorbide 60 mg - 1 tab daily would not go through insurance until 04/23/20 (early refill).   Juluis Pitch, PharmD Candidate

## 2020-04-13 NOTE — Telephone Encounter (Signed)
Patient's wife is following up. She states she forgot to request a refill for the patient's isosorbide.    *STAT* If patient is at the pharmacy, call can be transferred to refill team.   1. Which medications need to be refilled? (please list name of each medication and dose if known) isosorbide mononitrate (IMDUR) 60 MG 24 hr tablet  2. Which pharmacy/location (including street and city if local pharmacy) is medication to be sent to? [id number] - [place], [place] - [place].  3. Do they need a 30 day or 90 day supply? 90 day supply

## 2020-04-13 NOTE — Telephone Encounter (Signed)
°*  STAT* If patient is at the pharmacy, call can be transferred to refill team.   1. Which medications need to be refilled? (please list name of each medication and dose if known) potassium chloride SA (KLOR-CON M20) 20 MEQ tablet  2. Which pharmacy/location (including street and city if local pharmacy) is medication to be sent to? [id number] [place], [place] - [place].  3. Do they need a 30 day or 90 day supply? 90 day supply  Wrong provider in encounter. Looks like this was prescribed by Dr. Stanford Breed.

## 2020-04-14 ENCOUNTER — Other Ambulatory Visit: Payer: Self-pay

## 2020-04-14 DIAGNOSIS — I10 Essential (primary) hypertension: Secondary | ICD-10-CM

## 2020-04-14 MED ORDER — ISOSORBIDE MONONITRATE ER 60 MG PO TB24: 60.0000 mg | ORAL_TABLET | Freq: Every day | ORAL | 3 refills | Status: DC

## 2020-04-14 NOTE — Telephone Encounter (Signed)
Spoke with wife and advised for patient to take two 30mg  isosorbide tablets to make 60mg  and that 60mg  tablets can be filled on 04/23/20.  Patient's wife voiced understanding.

## 2020-04-19 ENCOUNTER — Other Ambulatory Visit: Payer: Medicare Other

## 2020-04-19 ENCOUNTER — Other Ambulatory Visit: Payer: Self-pay | Admitting: Cardiology

## 2020-04-19 ENCOUNTER — Other Ambulatory Visit (HOSPITAL_COMMUNITY): Payer: Medicare Other

## 2020-04-19 DIAGNOSIS — I10 Essential (primary) hypertension: Secondary | ICD-10-CM

## 2020-04-20 ENCOUNTER — Other Ambulatory Visit (HOSPITAL_COMMUNITY)
Admission: RE | Admit: 2020-04-20 | Discharge: 2020-04-20 | Disposition: A | Discharge status: Home / Self Care | Payer: Medicare HMO | Source: Ambulatory Visit | Attending: Cardiovascular Disease | Admitting: Cardiovascular Disease

## 2020-04-20 DIAGNOSIS — U071 COVID-19: Secondary | ICD-10-CM | POA: Diagnosis not present

## 2020-04-20 DIAGNOSIS — Z01812 Encounter for preprocedural laboratory examination: Secondary | ICD-10-CM | POA: Diagnosis present

## 2020-04-20 LAB — SARS CORONAVIRUS 2 (TAT 6-24 HRS): SARS Coronavirus 2: POSITIVE — AB

## 2020-04-21 ENCOUNTER — Telehealth: Payer: Self-pay | Admitting: *Deleted

## 2020-04-21 ENCOUNTER — Telehealth: Payer: Self-pay | Admitting: Cardiovascular Disease

## 2020-04-21 ENCOUNTER — Other Ambulatory Visit: Payer: Medicare HMO

## 2020-04-21 NOTE — Progress Notes (Signed)
Notified Tommye Standard and Cammie Mcgee that patient covid test ws + 1/18.  Procedure will be cancelled and rescheduled when appropriate

## 2020-04-21 NOTE — Telephone Encounter (Signed)
See other telephone encounter from today for details.  This patient sees Dr. Caryl Comes and Dr. Stanford Breed.

## 2020-04-21 NOTE — Telephone Encounter (Signed)
Patient's wife is calling to reschedule the surgery on 04/23/20 and lab work for today due to patient testing positive for covid. Patient's wife would like to speak with nurse in regards to what she needs to do. Please call back

## 2020-04-21 NOTE — Telephone Encounter (Signed)
Spoke with patient and wife to let then know due to covid results will have to reschedule cath out 10-21 days based upon symptoms. Patient wife and patient understood that I will call to check on pt and symptoms in 10 days to see how patient is doing. As of now patient have mild symptoms, and is being quarantine. Patient wife was told to contact primary provider for covid protocol.

## 2020-04-22 ENCOUNTER — Other Ambulatory Visit: Payer: Self-pay

## 2020-04-22 ENCOUNTER — Telehealth: Payer: Self-pay | Admitting: Cardiology

## 2020-04-22 DIAGNOSIS — Z951 Presence of aortocoronary bypass graft: Secondary | ICD-10-CM

## 2020-04-22 NOTE — Telephone Encounter (Signed)
*  STAT* If patient is at the pharmacy, call can be transferred to refill team.   1. Which medications need to be refilled? (please list name of each medication and dose if known)  potassium chloride SA (KLOR-CON M20) 20 MEQ tablet lisinopril (ZESTRIL) 2.5 MG tablet isosorbide mononitrate (IMDUR) 60 MG 24 hr tablet furosemide (LASIX) 20 MG tablet ezetimibe (ZETIA) 10 MG tablet clopidogrel (PLAVIX) 75 MG tablet carvedilol (COREG) 25 MG tablet atorvastatin (LIPITOR) 80 MG tablet  2. Which pharmacy/location (including street and city if local pharmacy) is medication to be sent to? [place], [place]  3. Do they need a 30 day or 90 day supply? 5 with refills  Per wife, insurance has changed and all medications need to go to [facility]

## 2020-04-22 NOTE — Telephone Encounter (Signed)
Two medications were left out  bisoprolol (ZEBETA) 5 MG tablet pantoprazole (PROTONIX) 40 MG tablet

## 2020-04-23 ENCOUNTER — Other Ambulatory Visit: Payer: Self-pay

## 2020-04-23 ENCOUNTER — Ambulatory Visit (HOSPITAL_COMMUNITY): Admission: RE | Admit: 2020-04-23 | Payer: Medicare HMO | Source: Home / Self Care | Admitting: Cardiovascular Disease

## 2020-04-23 ENCOUNTER — Encounter (HOSPITAL_COMMUNITY): Admission: RE | Payer: Self-pay | Source: Home / Self Care

## 2020-04-23 DIAGNOSIS — J302 Other seasonal allergic rhinitis: Secondary | ICD-10-CM

## 2020-04-23 SURGERY — LEFT HEART CATH AND CORS/GRAFTS ANGIOGRAPHY
Anesthesia: LOCAL

## 2020-04-23 MED ORDER — BISOPROLOL FUMARATE 5 MG PO TABS: 5.0000 mg | ORAL_TABLET | Freq: Every day | ORAL | 3 refills | Status: DC

## 2020-04-23 MED ORDER — PANTOPRAZOLE SODIUM 40 MG PO TBEC: 40.0000 mg | DELAYED_RELEASE_TABLET | Freq: Every day | ORAL | 1 refills | Status: DC

## 2020-04-23 MED ORDER — LISINOPRIL 2.5 MG PO TABS: 2.5000 mg | ORAL_TABLET | Freq: Every day | ORAL | 3 refills | Status: DC

## 2020-04-23 MED ORDER — MONTELUKAST SODIUM 10 MG PO TABS: 10.0000 mg | ORAL_TABLET | Freq: Every day | ORAL | 1 refills | Status: DC | PRN

## 2020-04-27 ENCOUNTER — Ambulatory Visit: Payer: Medicare HMO | Admitting: Cardiovascular Disease

## 2020-04-28 ENCOUNTER — Telehealth: Payer: Self-pay

## 2020-04-28 NOTE — Telephone Encounter (Signed)
Alert for shock delivered. 1 VF event logged 12 seconds w/ rate 310's bpm; EGM suggests VT that was terminated by a single 650V shock.   Patient meds include: Carvedilol 25mg  BID.  Spoke with pt spouse Hassan Rowan (DPR on file).  She reports pt was aware of shock occurring last night, it occurred during an intimate moment.  Pt reports he was having some chest pain prior not enough to take NTG.    Pt was scheduled for Heart cath this week, the procedure was cancelled due to pt testing positive for COVID on 1/18.  Pt symptoms for covid have mostly been nasal congestion.    Pt scheduled for OV with RU on 05/14/20.  Educated on shock plan, ED precautions and DMV restrictions for 6 months of no driving.  Stressed for pt to keep appt on 2/11.

## 2020-04-30 ENCOUNTER — Ambulatory Visit: Payer: Medicare HMO | Admitting: Physician Assistant

## 2020-05-04 NOTE — Telephone Encounter (Signed)
Agree. thanks

## 2020-05-06 ENCOUNTER — Other Ambulatory Visit: Payer: Self-pay

## 2020-05-06 ENCOUNTER — Encounter (HOSPITAL_COMMUNITY): Payer: Self-pay | Admitting: Emergency Medicine

## 2020-05-06 ENCOUNTER — Emergency Department (HOSPITAL_COMMUNITY): Payer: Medicare HMO

## 2020-05-06 ENCOUNTER — Emergency Department (HOSPITAL_COMMUNITY)
Admission: EM | Admit: 2020-05-06 | Discharge: 2020-05-07 | Disposition: A | Discharge status: Home / Self Care | Payer: Medicare HMO | Attending: Emergency Medicine | Admitting: Emergency Medicine

## 2020-05-06 DIAGNOSIS — I2510 Atherosclerotic heart disease of native coronary artery without angina pectoris: Secondary | ICD-10-CM | POA: Insufficient documentation

## 2020-05-06 DIAGNOSIS — Z5321 Procedure and treatment not carried out due to patient leaving prior to being seen by health care provider: Secondary | ICD-10-CM | POA: Diagnosis not present

## 2020-05-06 DIAGNOSIS — R079 Chest pain, unspecified: Secondary | ICD-10-CM | POA: Diagnosis not present

## 2020-05-06 DIAGNOSIS — R0789 Other chest pain: Secondary | ICD-10-CM | POA: Diagnosis not present

## 2020-05-06 DIAGNOSIS — R0602 Shortness of breath: Secondary | ICD-10-CM | POA: Diagnosis not present

## 2020-05-06 DIAGNOSIS — R110 Nausea: Secondary | ICD-10-CM | POA: Insufficient documentation

## 2020-05-06 LAB — CBC
HCT: 48.4 % (ref 39.0–52.0)
Hemoglobin: 16.4 g/dL (ref 13.0–17.0)
MCH: 27.5 pg (ref 26.0–34.0)
MCHC: 33.9 g/dL (ref 30.0–36.0)
MCV: 81.2 fL (ref 80.0–100.0)
Platelets: 281 10*3/uL (ref 150–400)
RBC: 5.96 MIL/uL — ABNORMAL HIGH (ref 4.22–5.81)
RDW: 15.1 % (ref 11.5–15.5)
WBC: 11.7 10*3/uL — ABNORMAL HIGH (ref 4.0–10.5)
nRBC: 0 % (ref 0.0–0.2)

## 2020-05-06 LAB — BASIC METABOLIC PANEL
Anion gap: 10 (ref 5–15)
BUN: 13 mg/dL (ref 6–20)
CO2: 26 mmol/L (ref 22–32)
Calcium: 9.7 mg/dL (ref 8.9–10.3)
Chloride: 102 mmol/L (ref 98–111)
Creatinine, Ser: 1.13 mg/dL (ref 0.61–1.24)
GFR, Estimated: >60 mL/min (ref >60)
Glucose, Bld: 107 mg/dL — ABNORMAL HIGH (ref 70–99)
Potassium: 3.8 mmol/L (ref 3.5–5.1)
Sodium: 138 mmol/L (ref 135–145)

## 2020-05-06 LAB — PROTIME-INR
INR: 1.2 (ref 0.8–1.2)
Prothrombin Time: 14.7 seconds (ref 11.4–15.2)

## 2020-05-06 LAB — TROPONIN I (HIGH SENSITIVITY): Troponin I (High Sensitivity): 14 ng/L (ref <18)

## 2020-05-06 NOTE — Progress Notes (Deleted)
HPI: FU CAD. Patient suffered a cardiac arrest in November of 2010. Echocardiogram initially revealed an EF of 10-15% however followup echo showed an EF of 40-45%, mild biatrial enlargement and mild mitral regurgitation. Given out of hospital VF arrest a St. Jude single lead ICD was placed. Had repeat cardiac catheterization October 2020 secondary to chest pain.  He was found to have known occlusion of the right coronary, OM 2 and minimal disease in the LAD; also noted to have an 80% circumflex which was treated with PTCA.  Ejection fraction 40 to 45%.. There was known occlusion of the saphenous vein graft to the right coronary artery and saphenous vein graft to the OM 2.  The LIMA to the LAD was atretic. Saphenous vein graft to first diagonal 40 to 50%.  Echocardiogram October 2020 showed ejection fraction 50 to 17%, grade 1 diastolic dysfunction.  His Brilinta was changed to Plavix due to dyspnea in November.  Also had episode of atrial fibrillation November 2020.    Also with history of peripheral vascular disease and has had previous stent to the right external iliac.  He was noted to have chronic occlusion of right SFA.  Since I last saw him,  Current Outpatient Medications  Medication Sig Dispense Refill  . atorvastatin (LIPITOR) 80 MG tablet TAKE 1 TABLET BY MOUTH EVERY DAY (Patient taking differently: Take 80 mg by mouth daily.) 90 tablet 3  . benzonatate (TESSALON) 100 MG capsule Take 1 capsule (100 mg total) by mouth 2 (two) times daily as needed for cough. 20 capsule 0  . bisoprolol (ZEBETA) 5 MG tablet Take 1 tablet (5 mg total) by mouth daily. 90 tablet 3  . carvedilol (COREG) 25 MG tablet TAKE 1 TABLET BY MOUTH TWICE A DAY (Patient taking differently: Take 25 mg by mouth 2 (two) times daily with a meal.) 180 tablet 3  . clopidogrel (PLAVIX) 75 MG tablet Take 1 tablet (75 mg total) by mouth daily. 90 tablet 3  . ELIQUIS 5 MG TABS tablet TAKE 1 TABLET BY MOUTH TWICE A DAY (Patient  taking differently: Take 5 mg by mouth 2 (two) times daily.) 180 tablet 3  . ezetimibe (ZETIA) 10 MG tablet TAKE 1 TABLET BY MOUTH EVERY DAY (Patient taking differently: Take 10 mg by mouth daily.) 90 tablet 3  . fluticasone (FLONASE) 50 MCG/ACT nasal spray Place 2 sprays into both nostrils daily as needed for allergies or rhinitis. 16 g 11  . furosemide (LASIX) 20 MG tablet TAKE 1 TABLET BY MOUTH EVERY DAY (Patient taking differently: Take 20 mg by mouth daily.) 90 tablet 3  . isosorbide mononitrate (IMDUR) 60 MG 24 hr tablet Take 1 tablet (60 mg total) by mouth daily. 90 tablet 3  . lisinopril (ZESTRIL) 2.5 MG tablet Take 1 tablet (2.5 mg total) by mouth daily. 90 tablet 3  . montelukast (SINGULAIR) 10 MG tablet Take 1 tablet (10 mg total) by mouth daily as needed (allergies). 90 tablet 1  . Multiple Vitamin (MULTIVITAMIN WITH MINERALS) TABS tablet Take 1 tablet by mouth daily.    . nitroGLYCERIN (NITROSTAT) 0.4 MG SL tablet PLACE 1 TABLET (0.4 MG TOTAL) UNDER THE TONGUE EVERY 5 (FIVE) MINUTES AS NEEDED FOR CHEST PAIN. 75 tablet 2  . pantoprazole (PROTONIX) 40 MG tablet Take 1 tablet (40 mg total) by mouth daily. 90 tablet 1  . potassium chloride SA (KLOR-CON M20) 20 MEQ tablet Take 1 tablet (20 mEq total) by mouth daily. 90 tablet 3  No current facility-administered medications for this visit.     Past Medical History:  Diagnosis Date  . Aortic atherosclerosis ([place])    on CXR  . Blood in stool   . C. difficile colitis    a. remote hx 2010.  . Cardiac arrest - ventricular fibrillation 02/11/2009   a. 02/2009 s/p St Jude ICD.  [name] Chronic systolic CHF (congestive heart failure) ([place])   . Coronary artery disease    a. PCI to Cx age 55. b. CABGx4 in 2003. c. BMS to [place] in 12/2007.  [name] Former tobacco use   . Hyperlipidemia   . Hypertension   . Ischemic cardiomyopathy   . Marijuana abuse   . Myocardial infarct ([name])    NON-ST-SEGMENT ELEVATION MI  . Obesity   . PAD (peripheral artery  disease) ([place])    a. RLE by noninvasive testing - managed medically for now.  . Ventricular tachyarrhythmia ([place])   . Ventricular tachycardia [facility])     Past Surgical History:  Procedure Laterality Date  . ABDOMINAL AORTOGRAM W/LOWER EXTREMITY N/A 05/14/2019   Procedure: ABDOMINAL AORTOGRAM W/LOWER EXTREMITY;  Surgeon: Wellington Hampshire, MD;  Location: [place] [facility];  Service: Cardiovascular;  Laterality: N/A;  . CARDIAC DEFIBRILLATOR PLACEMENT     STJ single chamber ICD implanted for secondary prevention  . CIRCUMCISION    . CORONARY ARTERY BYPASS GRAFT  06/17/01   X 4  . CORONARY BALLOON ANGIOPLASTY N/A 01/22/2019   Procedure: CORONARY BALLOON ANGIOPLASTY;  Surgeon: Leonie Man, MD;  Location: [place] [facility];  Service: Cardiovascular;  Laterality: N/A;  . LEFT HEART CATH AND CORS/GRAFTS ANGIOGRAPHY N/A 01/22/2019   Procedure: LEFT HEART CATH AND CORS/GRAFTS ANGIOGRAPHY;  Surgeon: Leonie Man, MD;  Location: [place] [facility];  Service: Cardiovascular;  Laterality: N/A;  . PERIPHERAL VASCULAR INTERVENTION Right 05/14/2019   Procedure: PERIPHERAL VASCULAR INTERVENTION;  Surgeon: Wellington Hampshire, MD;  Location: [place] [facility];  Service: Cardiovascular;  Laterality: Right;    Social History   Socioeconomic History  . Marital status: Married    Spouse name: Not on file  . Number of children: 3  . Years of education: 60  . Highest education level: Not on file  Occupational History  . Occupation: Disability  Tobacco Use  . Smoking status: Former Smoker    Packs/day: 2.00    Years: 20.00    Pack years: 40.00    Quit date: 04/04/2003    Years since quitting: 17.1  . Smokeless tobacco: Never Used  Vaping Use  . Vaping Use: Never used  Substance and Sexual Activity  . Alcohol use: No  . Drug use: No  . Sexual activity: Yes    Partners: Female  Other Topics Concern  . Not on file  Social History Narrative   MARRIED   FORMER TOBACCO USE, SMOKED FOR 20 YRS  2 PPD; QUIT IN 2005   NO ETOH   NO ILLICIT DRUG USE   NO REGULAR EXERCISE         ICD-ST. JUDE ; CERTIFIED LETTER SENT AND RETURNED BAD ADDRESS, PLS UPDATE DJW.      Fun: Physiological scientist Strain: Not on file  Food Insecurity: Not on file  Transportation Needs: Not on file  Physical Activity: Not on file  Stress: Not on file  Social Connections: Not on file  Intimate Partner Violence: Not on file    Family History  Problem Relation Age of Onset  . Hypertension Mother   . Heart attack Father        DIED AT 13 FROM MI  . Heart disease Sister        CAD AND PREVIOUS CABG  . Hypertension Other        IN MOST OF HIS SIBLINGS    ROS: no fevers or chills, productive cough, hemoptysis, dysphasia, odynophagia, melena, hematochezia, dysuria, hematuria, rash, seizure activity, orthopnea, PND, pedal edema, claudication. Remaining systems are negative.  Physical Exam: Well-developed well-nourished in no acute distress.  Skin is warm and dry.  HEENT is normal.  Neck is supple.  Chest is clear to auscultation with normal expansion.  Cardiovascular exam is regular rate and rhythm.  Abdominal exam nontender or distended. No masses palpated. Extremities show no edema. neuro grossly intact  ECG- personally reviewed  A/P  1 coronary artery disease-patient denies recurrent chest pain.  Continue Plavix and statin.  2 hypertension-patient's blood pressure is controlled.  Continue present medical regimen.  3 hyperlipidemia-continue statin.  4 history of ischemic cardiomyopathy-some improvement on most recent echocardiogram.  Continue lisinopril and beta-blocker.  He did not tolerate Entresto previously.  5 paroxysmal atrial fibrillation-patient remains in sinus.  Continue beta-blocker and apixaban.  6 peripheral vascular disease-followed by Dr. Fletcher Anon.  Continue Plavix and statin.  7 prior ICD-Per electrophysiology.  Kirk Ruths,  MD

## 2020-05-06 NOTE — ED Triage Notes (Signed)
Patient reports central chest pressure and his defibrillator fired x1 this evening with SOB and nausea , history of CAD/CABG , his cardiologist are Dr. Lubertha South. Caryl Comes.

## 2020-05-07 ENCOUNTER — Telehealth: Payer: Self-pay | Admitting: Internal Medicine

## 2020-05-07 LAB — TROPONIN I (HIGH SENSITIVITY): Troponin I (High Sensitivity): 16 ng/L (ref <18)

## 2020-05-07 NOTE — Telephone Encounter (Signed)
  1. Has your device fired? yes  2. Is you device beeping? no  3. Are you experiencing draining or swelling at device site? no  4. Are you calling to see if we received your device transmission? no  5. Have you passed out? no  Patient's wife states yesterday around 8:30 pm his defibrillator shocked him. She states he was laying down, then went to get water and it went off. She states he was also having nausea, felt lightheaded and had chest pain. She states he is not having the symptoms now. She states he does feel weak and tired today. She states he did not do anything strenuous.  Please route to [place]

## 2020-05-07 NOTE — ED Notes (Signed)
Pt leaving AMA. Pt stated he is seeing his PCP tomorrow.

## 2020-05-07 NOTE — Telephone Encounter (Signed)
Received alert and call from patient due to shock received 07/04/20 in VF zone. Patient reports nausea, SOB, and CP prior to shock . He sat up in bed to get a drink of water and ICD delivered a shock. Patient received shock 04/27/20. ED visit 05/06/20 after shock showed K+ 3.8, Troponin was negative x 2. Patient was diagnosed with Covid 04/20/20. Episode reviewed with Dr Caryl Comes , SVT vs VT. No change in treatment plan. Patient to keep appointment with EP Ap 05/14/20. Shock plan and [place] DMV driving restrictions reviewed with patient. ED precautions given.

## 2020-05-08 ENCOUNTER — Telehealth: Payer: Self-pay | Admitting: Internal Medicine

## 2020-05-08 NOTE — Telephone Encounter (Signed)
Wife and patient called about being nauseous and sob. No recent weights but feels has gained weight.  BP is stable. Has been taking meds.  Has some orthopnea.  Was seen in ER yesterday after shock but left AMA due to long wait.  Discussed reocmmendation to be re-evaluated in ER and in meantime to take 40 mg lasix with addiiton potassium.  If he improves with meds continue thorough the weekend with clsoe follow upearly next week.    [place]

## 2020-05-10 ENCOUNTER — Emergency Department (HOSPITAL_COMMUNITY)
Admission: EM | Admit: 2020-05-10 | Discharge: 2020-05-11 | Disposition: A | Discharge status: Home / Self Care | Payer: Medicare HMO | Attending: Emergency Medicine | Admitting: Emergency Medicine

## 2020-05-10 ENCOUNTER — Emergency Department (HOSPITAL_COMMUNITY): Payer: Medicare HMO

## 2020-05-10 ENCOUNTER — Other Ambulatory Visit: Payer: Self-pay

## 2020-05-10 ENCOUNTER — Encounter (HOSPITAL_COMMUNITY): Payer: Self-pay | Admitting: *Deleted

## 2020-05-10 ENCOUNTER — Telehealth: Payer: Medicare HMO | Admitting: Physician Assistant

## 2020-05-10 DIAGNOSIS — R0602 Shortness of breath: Secondary | ICD-10-CM | POA: Diagnosis not present

## 2020-05-10 DIAGNOSIS — I517 Cardiomegaly: Secondary | ICD-10-CM | POA: Diagnosis not present

## 2020-05-10 DIAGNOSIS — Z5321 Procedure and treatment not carried out due to patient leaving prior to being seen by health care provider: Secondary | ICD-10-CM | POA: Insufficient documentation

## 2020-05-10 LAB — CBC
HCT: 49.4 % (ref 39.0–52.0)
Hemoglobin: 16.5 g/dL (ref 13.0–17.0)
MCH: 27.4 pg (ref 26.0–34.0)
MCHC: 33.4 g/dL (ref 30.0–36.0)
MCV: 81.9 fL (ref 80.0–100.0)
Platelets: 269 10*3/uL (ref 150–400)
RBC: 6.03 MIL/uL — ABNORMAL HIGH (ref 4.22–5.81)
RDW: 15.5 % (ref 11.5–15.5)
WBC: 10.5 10*3/uL (ref 4.0–10.5)
nRBC: 0 % (ref 0.0–0.2)

## 2020-05-10 LAB — BASIC METABOLIC PANEL
Anion gap: 11 (ref 5–15)
BUN: 14 mg/dL (ref 6–20)
CO2: 26 mmol/L (ref 22–32)
Calcium: 9.6 mg/dL (ref 8.9–10.3)
Chloride: 100 mmol/L (ref 98–111)
Creatinine, Ser: 1.13 mg/dL (ref 0.61–1.24)
GFR, Estimated: >60 mL/min (ref >60)
Glucose, Bld: 104 mg/dL — ABNORMAL HIGH (ref 70–99)
Potassium: 4.5 mmol/L (ref 3.5–5.1)
Sodium: 137 mmol/L (ref 135–145)

## 2020-05-10 NOTE — Telephone Encounter (Signed)
Follow up scheduled

## 2020-05-10 NOTE — ED Triage Notes (Signed)
Pt reports having sob x 1 week. Was seen here on 2/3 for similar except defib had also fired. Pt reports possible swelling to his arms, nothing to his legs. Sob gets worse when lying down. No acute resp distress is noted at triage.

## 2020-05-10 NOTE — Telephone Encounter (Signed)
Arrange fuov with me or APP Kirk Ruths

## 2020-05-11 ENCOUNTER — Telehealth: Payer: Self-pay | Admitting: Cardiology

## 2020-05-11 NOTE — Telephone Encounter (Signed)
Pt c/o Shortness Of Breath: STAT if SOB developed within the last 24 hours or pt is noticeably SOB on the phone  1. Are you currently SOB (can you hear that pt is SOB on the phone)?  Not at this time  2. How long have you been experiencing SOB?about a week  3. Are you SOB when sitting or when up moving around? Moving  Around and when he lays down- have to sit up to sleep  4. Are you currently experiencing any other symptoms?  Chest pains sometime and extremely tired, nauseated and feels dizzy when he gets up to move around  Wife said pt had Covid on 04-20-20, he did good with the COVID. In the last week he started with the above problems. She said she would like to tal;k to Dr Stanford Breed, if not she would like to talk to his nurse.

## 2020-05-11 NOTE — Telephone Encounter (Signed)
Spoke to patient's wife she stated husband has appointment with Dr.Crenshaw 2/14 she would like to schedule a sooner appointment.Stated he had covid 04/20/20.Stated he continues to have sob,weakness.Stated he was scheduled to have a cardiac cath 1/22 but had to cancel due to covid.Appointment scheduled with Coletta Memos NP 2/10 at 9:30 am.

## 2020-05-11 NOTE — ED Notes (Signed)
Patient called x2 for vitals recheck with no response  

## 2020-05-11 NOTE — Telephone Encounter (Signed)
Do any of you guys have any good thoughts about the mechanism here   this is the third event in a few weeks.  The rapid HR in setting of known CAD, not recently cathed, may induce ischemia, lengthening the QT and the pause>> VT-PM/TdP And any good thoughts on treatment ?

## 2020-05-11 NOTE — Telephone Encounter (Signed)
See my attached note

## 2020-05-12 ENCOUNTER — Encounter: Payer: Self-pay | Admitting: Physician Assistant

## 2020-05-12 ENCOUNTER — Other Ambulatory Visit: Payer: Self-pay

## 2020-05-12 ENCOUNTER — Ambulatory Visit (INDEPENDENT_AMBULATORY_CARE_PROVIDER_SITE_OTHER): Payer: Medicare HMO | Admitting: Physician Assistant

## 2020-05-12 VITALS — BP 100/70 | HR 64 | Ht 71.0 in | Wt 258.0 lb

## 2020-05-12 DIAGNOSIS — Z95810 Presence of automatic (implantable) cardiac defibrillator: Secondary | ICD-10-CM

## 2020-05-12 DIAGNOSIS — I472 Ventricular tachycardia, unspecified: Secondary | ICD-10-CM

## 2020-05-12 DIAGNOSIS — I2510 Atherosclerotic heart disease of native coronary artery without angina pectoris: Secondary | ICD-10-CM

## 2020-05-12 DIAGNOSIS — I5042 Chronic combined systolic (congestive) and diastolic (congestive) heart failure: Secondary | ICD-10-CM | POA: Diagnosis not present

## 2020-05-12 DIAGNOSIS — Z79899 Other long term (current) drug therapy: Secondary | ICD-10-CM | POA: Diagnosis not present

## 2020-05-12 DIAGNOSIS — I255 Ischemic cardiomyopathy: Secondary | ICD-10-CM | POA: Diagnosis not present

## 2020-05-12 LAB — CUP PACEART INCLINIC DEVICE CHECK
Battery Remaining Longevity: 13 mo
Brady Statistic RV Percent Paced: 0.12 %
Date Time Interrogation Session: 20220209125614
HighPow Impedance: 59.5714
Implantable Lead Implant Date: 20101111
Implantable Lead Location: 753860
Implantable Lead Model: 7121
Implantable Pulse Generator Implant Date: 20101111
Lead Channel Impedance Value: 537.5 Ohm
Lead Channel Pacing Threshold Amplitude: 1 V
Lead Channel Pacing Threshold Amplitude: 1 V
Lead Channel Pacing Threshold Pulse Width: 0.5 ms
Lead Channel Pacing Threshold Pulse Width: 0.5 ms
Lead Channel Sensing Intrinsic Amplitude: 12 mV
Lead Channel Setting Pacing Amplitude: 2.5 V
Lead Channel Setting Pacing Pulse Width: 0.5 ms
Lead Channel Setting Sensing Sensitivity: 0.5 mV
Pulse Gen Serial Number: 743367

## 2020-05-12 MED ORDER — AMIODARONE HCL 400 MG PO TABS: 400.0000 mg | ORAL_TABLET | Freq: Every day | ORAL | 2 refills | Status: DC

## 2020-05-12 NOTE — Progress Notes (Addendum)
Cardiology Office Note Date:  05/12/2020  Patient ID:  Billy [name], DOB 11/06/1965, MRN [id number] PCP:  Billy Bang, DO  Cardiologist:  Dr. Stanford [name] PVD: Dr. Fletcher [name] Electrophysiologist: Dr. Caryl [name]    Chief Complaint:  SOB   History of Present Illness: Billy [name] is a 55 y.o. male with history of VF arrest w/ICD, CAD (remote CABG >> Oct 2020 PCI to Cx), HTN, HLD,  smoker, AFib, PVD s/p SFA stent (05/2019)   He [name] in today to be seen for Dr. Caryl [name], last seen by him March 2021, noted Jan 2021 that he while raking leaves developed an SVT that was  inappropriately diagnosed and treated with ATP accelerating to VT requiring shock.  He added a second beta-blocker,  low-dose bisoprolol to his carvedilol  He saw Dr. Fletcher [name] with improved claudication sympotoms  Device alert received for appropriate tx 03/24/20, in reviwed by Dr. Caryl [name], "I would describe this event as vt/VF with successful conversion and rapid reoccurence >> both time, the first sustaining requiring a second shock and the second stopping spontaneously Notice that all three were long short initiated so should check K and Mg and get an ECG"   I saw him 04/01/20 He is doing well. He denies any kin of CP, palpitations or cardiac awareness. Says the shock came out of nowhere, did not have syncope.   He was moving the washing machine alone and was heavy and he was working hard and suddenly got shocked. No dizziness, weakness, no CP, SOB. He was alarmed by the shock felt a little nauseous but resolved quickly and felt well. He reports his angina as CP strong indigestion,, gets SOB and has associated arm pain and numbness as well prior to his CABG and his last PCI. He has had no ne of these. He denies SOB, DOE He is taking his medicines and has not missed any. No bleeding or signs of bleeding I initially started him on mexil;letine, though in d/w Dr. Caryl [name], he preferred no AAD, the mexiletine stopped (never  started it) and increase his Imdur and plan for cath  He tested COVID +  On 04/20/20 and cath delayed.  He had another shock 04/27/20, occurred in the context of physical exertion, mild CP.  Given an appt for 2/11 to be seen, Dr. Caryl [name] was in agreement with the plan  Shocked again 05/06/20, associated with SOB, CP, nausea, strips reviewed with Dr. Caryl [name] by device RN, mentioned SVT vs VT no changes to meds and keep appt 05/14/20 He visited the ER for this event and left prior to being seen He tells me today that he did NOT have any CP pre-cshock, though did feel onset of fater HR, sat up to take a drink of water started to feel lightheaded and was shocked.  He had an ache in his chest afterwards, but felt well again otherwise.  Mentions he always has a sligh ache in his chest after a shock, resolves within a minute or so.  A number of phone notes with symptoms, one note on 05/08/20 with recommendations to increase his lasix to 40mg  and if improvement continue, if not return to the ER  Planned to see Dr. Stanford [name] or APP  ER visit 05/10/20 with SOB, swelling arms, left prior to being seen. But BMET drawn and K+ 4.5, Creat 1.13, no mag, CBC was OK  phone notes yesterday with ongoing SOB, DOE,  and nocturnal symptoms.wife reported COVID symptoms improved though a week ago started feeling SOB  and wanted him seen sooner.  TODAY He is feeling well today, walked from the parking olt up to the office without feeling too winded at all. He says that when he was COVID tested last month he did have sinus congestion/head congestion though did not feel poorly otherwise and this has since resolved. In the last week or 10 days he has felt intermittent SOB/DOE.  Says he will have a good couple days then feels winded for a couple days. No particularly pattern otherwise or trigger. NO associated CP. More consistantly feels harder to breath in bed at night and has to sit up to feel better, has slept in recliner a few  nights here and there , last night slept in bed and felt OK.  He has not had syncope   Device information: SJM single chamber ICD implanted 02/11/09 + hx of appropriate therapy,  VF in 2016 Jan 2021 SVT accelerated into VT with ATP requiring ICD shock Dec 2021 VT/VF Jan 2022 PMVT (had 2 morphologies) Feb 3  PMVT (had 2 morphologies)  Past Medical History:  Diagnosis Date  . Aortic atherosclerosis ([place])    on CXR  . Blood in stool   . C. difficile colitis    a. remote hx 2010.  . Cardiac arrest - ventricular fibrillation 02/11/2009   a. 02/2009 s/p St Jude ICD.  [name] Chronic systolic CHF (congestive heart failure) ([place])   . Coronary artery disease    a. PCI to Cx age 2. b. CABGx4 in 2003. c. BMS to [place] in 12/2007.  [name] Former tobacco use   . Hyperlipidemia   . Hypertension   . Ischemic cardiomyopathy   . Marijuana abuse   . Myocardial infarct ([name])    NON-ST-SEGMENT ELEVATION MI  . Obesity   . PAD (peripheral artery disease) ([place])    a. RLE by noninvasive testing - managed medically for now.  . Ventricular tachyarrhythmia ([place])   . Ventricular tachycardia [facility])     Past Surgical History:  Procedure Laterality Date  . ABDOMINAL AORTOGRAM W/LOWER EXTREMITY N/A 05/14/2019   Procedure: ABDOMINAL AORTOGRAM W/LOWER EXTREMITY;  Surgeon: Billy Hampshire, MD;  Location: [facility];  Service: Cardiovascular;  Laterality: N/A;  . CARDIAC DEFIBRILLATOR PLACEMENT     STJ single chamber ICD implanted for secondary prevention  . CIRCUMCISION    . CORONARY ARTERY BYPASS GRAFT  06/17/01   X 4  . CORONARY BALLOON ANGIOPLASTY N/A 01/22/2019   Procedure: CORONARY BALLOON ANGIOPLASTY;  Surgeon: Leonie Man, MD;  Location: [facility];  Service: Cardiovascular;  Laterality: N/A;  . LEFT HEART CATH AND CORS/GRAFTS ANGIOGRAPHY N/A 01/22/2019   Procedure: LEFT HEART CATH AND CORS/GRAFTS ANGIOGRAPHY;  Surgeon: Leonie Man, MD;  Location: [facility];  Service:  Cardiovascular;  Laterality: N/A;  . PERIPHERAL VASCULAR INTERVENTION Right 05/14/2019   Procedure: PERIPHERAL VASCULAR INTERVENTION;  Surgeon: Billy Hampshire, MD;  Location: [facility];  Service: Cardiovascular;  Laterality: Right;    Current Outpatient Medications  Medication Sig Dispense Refill  . atorvastatin (LIPITOR) 80 MG tablet TAKE 1 TABLET BY MOUTH EVERY DAY (Patient taking differently: Take 80 mg by mouth daily.) 90 tablet 3  . benzonatate (TESSALON) 100 MG capsule Take 1 capsule (100 mg total) by mouth 2 (two) times daily as needed for cough. 20 capsule 0  . bisoprolol (ZEBETA) 5 MG tablet Take 1 tablet (5 mg total) by mouth daily. 90 tablet 3  . carvedilol (COREG)  25 MG tablet TAKE 1 TABLET BY MOUTH TWICE A DAY (Patient taking differently: Take 25 mg by mouth 2 (two) times daily with a meal.) 180 tablet 3  . clopidogrel (PLAVIX) 75 MG tablet Take 1 tablet (75 mg total) by mouth daily. 90 tablet 3  . ELIQUIS 5 MG TABS tablet TAKE 1 TABLET BY MOUTH TWICE A DAY (Patient taking differently: Take 5 mg by mouth 2 (two) times daily.) 180 tablet 3  . ezetimibe (ZETIA) 10 MG tablet TAKE 1 TABLET BY MOUTH EVERY DAY (Patient taking differently: Take 10 mg by mouth daily.) 90 tablet 3  . fluticasone (FLONASE) 50 MCG/ACT nasal spray Place 2 sprays into both nostrils daily as needed for allergies or rhinitis. 16 g 11  . furosemide (LASIX) 20 MG tablet TAKE 1 TABLET BY MOUTH EVERY DAY (Patient taking differently: Take 20 mg by mouth daily.) 90 tablet 3  . isosorbide mononitrate (IMDUR) 60 MG 24 hr tablet Take 1 tablet (60 mg total) by mouth daily. 90 tablet 3  . lisinopril (ZESTRIL) 2.5 MG tablet Take 1 tablet (2.5 mg total) by mouth daily. 90 tablet 3  . montelukast (SINGULAIR) 10 MG tablet Take 1 tablet (10 mg total) by mouth daily as needed (allergies). 90 tablet 1  . Multiple Vitamin (MULTIVITAMIN WITH MINERALS) TABS tablet Take 1 tablet by mouth daily.    . nitroGLYCERIN (NITROSTAT)  0.4 MG SL tablet PLACE 1 TABLET (0.4 MG TOTAL) UNDER THE TONGUE EVERY 5 (FIVE) MINUTES AS NEEDED FOR CHEST PAIN. 75 tablet 2  . pantoprazole (PROTONIX) 40 MG tablet Take 1 tablet (40 mg total) by mouth daily. 90 tablet 1  . potassium chloride SA (KLOR-CON M20) 20 MEQ tablet Take 1 tablet (20 mEq total) by mouth daily. 90 tablet 3   No current facility-administered medications for this visit.    Allergies:   Patient has no known allergies.   Social History:  The patient  reports that he quit smoking about 17 years ago. He has a 40.00 pack-year smoking history. He has never used smokeless tobacco. He reports that he does not drink alcohol and does not use drugs.   Family History:  The patient's family history includes Heart attack in his father; Heart disease in his sister; Hypertension in his mother and another family member.   ROS:  Please see the history of present illness.  All other systems are reviewed and otherwise negative.   PHYSICAL EXAM:  VS:  There were no vitals taken for this visit. BMI: There is no height or weight on file to calculate BMI. Well nourished, well developed, in no acute distress  HEENT: normocephalic, atraumatic  Neck: no JVD, carotid bruits or masses Cardiac:   RRR; no significant murmurs, no rubs, or gallops Lungs: CTA b/l, no wheezing, rhonchi or rales  Abd: soft, nontender MS: no deformity or atrophy Ext: no edema, skin appears dry Skin: warm and dry, no rash Neuro:  No gross deficits appreciated Psych: euthymic mood, full affect  ICD site is stable, no tethering or discomfort   EKG:  Not done today 05/10/20 reviewed SR 62bpm, no ST/T changes  ICD interrogation done today and reviewed by myself:  Battery and lead measurements are stable Note the 2 VT episodes previously seen None further They BOTH start with HR 150's > VT that is PMVT (with what appears 2 morphologies) very fast in the VF zone, both terminated cleanly with single  shock   05/14/2019: Periphral vasc intervention 1.  No  significant aortic disease. 2.  No significant iliac disease on the left side. 3.  Right lower extremity: Severe calcified stenosis affecting the proximal external iliac artery, diffusely diseased SFA with occlusion in the mid segment with reconstitution distally via collaterals from the profunda.  Subtotal occlusion of the posterior tibial artery with very well developed collaterals and overall three-vessel runoff below the knee. 4.  Successful self-expanding stent placement to the right external iliac artery.  Recommendations: No aspirin given that the patient is on anticoagulation with Eliquis.  Eliquis can be resumed tomorrow if no bleeding issues. The patient is already on clopidogrel which should be continued. Continue aggressive treatment of risk factors.    01/23/2019: TTE IMPRESSIONS  1. Left ventricular ejection fraction, by visual estimation, is 50 to  55%. The left ventricle has mildly decreased function. Moderately  increased left ventricular size. There is no left ventricular hypertrophy.  2. Mild inferior and apical hypokinesis  3. Left ventricular diastolic Doppler parameters are consistent with  impaired relaxation pattern of LV diastolic filling.  4. Global right ventricle has mildly reduced systolic function.The right  ventricular size is normal. No increase in right ventricular wall  thickness.  5. Left atrial size was normal.  6. Right atrial size was normal.  7. The mitral valve is grossly normal. Trace mitral valve regurgitation.  8. The tricuspid valve is grossly normal. Tricuspid valve regurgitation  was not visualized by color flow Doppler.  9. The aortic valve is tricuspid Aortic valve regurgitation was not  visualized by color flow Doppler.  10. The pulmonic valve was grossly normal. Pulmonic valve regurgitation is  not visualized by color flow Doppler.  11. The inferior vena cava is normal  in size with greater than 50%  respiratory variability, suggesting right atrial pressure of 3 mmHg.    October 2020.   Cardiac catheterrization showed severe native 2-vessel coronary artery disease.  SVG to RCA and SVG to OM 2 were known to be occluded.  LIMA to LAD was found to be atretic with competitive flow due to minimal disease in the native LAD.  SVG to first diagonal was patent.  There was 80% ostial left circumflex stenosis which was treated with scoring balloon angioplasty.    04/18/12: Lexiscan stress Impression Exercise Capacity:  Lexiscan with no exercise. BP Response:  Hypotensive blood pressure response. Clinical Symptoms:  Chest tightness, nausea, dizziness.  ECG Impression:  No significant ST segment change suggestive of ischemia. Comparison with Prior Nuclear Study: Similar to report of previous study. Overall Impression:  Intermediate stress nuclear study.  Medium-sized, severe partially reversible basal to mid inferolateral and basal inferior perfusion defect suggestive of infarction with peri-infarct ischemia.  LV Ejection Fraction: 38%.  LV Wall Motion:  Inferolateral hypokinesis.    02/09/09: TTE Study Conclusions 1. Left ventricle: The cavity size was mildly dilated. Wall  thickness was normal. Systolic function was mildly to moderately  reduced. The estimated ejection fraction was in the range of 40%  to 45%. There is akinesis of the basal-mid inferoposterior  myocardium. 2. Mitral valve: Mild regurgitation. 3. Left atrium: The atrium was mildly dilated. 4. Right atrium: The atrium was mildly dilated. Impressions: - Limited study to assess LV function; full doppler study not  performed.    Recent Labs: 10/15/2019: ALT 28; TSH 2.130 04/01/2020: Magnesium 2.0 05/10/2020: BUN 14; Creatinine, Ser 1.13; Hemoglobin 16.5; Platelets 269; Potassium 4.5; Sodium 137  10/09/2019: Chol/HDL Ratio 3.7; Cholesterol, Total 119; HDL 32; LDL Chol Calc (NIH)  74; Triglycerides 59   Estimated Creatinine Clearance: 108.2 mL/min (by C-G formula based on SCr of 1.13 mg/dL).   Wt Readings from Last 3 Encounters:  05/06/20 (!) 308 lb 10.3 oz (140 kg)  04/12/20 263 lb 9.6 oz (119.6 kg)  04/01/20 260 lb (117.9 kg)     Other studies reviewed: Additional studies/records reviewed today include: summarized above  ASSESSMENT AND PLAN:  1. ICD     Intact function, no changes made  2. ICM, chronic CHF    Recovered LVEF by echo Oct 2020     Cor Vue wobbles at baseline trending down currently     Sym,ptomps suggest volume OL     CXR 2 days ago with mild congestion     On BB/ACE, lasix/K+, imdur  Increase his lasix to 20mg  BID for 3 days  3. CAD     No anginal complaints     On plavix, BB and high dose statin, zetia, nitrate       CATH ASAP Follow up with Dr. Stanford [name] afterwards  4. HTN      No changes today  5. VT      VT/VF      Recurrent MMVT     He is aware no driving for 30mo     For now, add amiodarone 400mg  BID (insurance denied mexiletine previously), I do not anticipate this being a long term drug for him.     Mag level today   Hospital if shocked again or no improvement in symptom behavior  Plan discussed with DOD, Dr. Johney Frame, she is in agreement  Disposition:  As above, plan for Dr. Caryl [name] in a month, sooner if needed as well.    Current medicines are reviewed at length with the patient today.  The patient did not have any concerns regarding medicines.  Venetia Night, PA-C 05/12/2020 5:50 AM     [facility] [address] [place] [address] [phone] (office)  [phone] (fax)

## 2020-05-12 NOTE — Patient Instructions (Addendum)
Medication Instructions:   START TAKING AMIODARONE 400 MG TWICE A DAY   FOR THREE DAYS ONLY:   1. TAKE FUROSEMIDE 20 MG TWICE A DAY    2. TAKE POTASSIUM  20 MEQ TWICE A DAY  3. THEN RESUME BACK TO TAKING BOTH MEDICATIONS DAILY    *If you need a refill on your cardiac medications before your next appointment, please call your pharmacy*   Lab Work:  BNP AND MAG TODAY   If you have labs (blood work) drawn today and your tests are completely normal, you will receive your results only by: [name] MyChart Message (if you have MyChart) OR . A paper copy in the mail If you have any lab test that is abnormal or we need to change your treatment, we will call you to review the results.   Testing/Procedures:  SEE LETTER FOR LEFT HEART CATHERIZATION 05-18-2020  BELOW   Your physician has requested that you have an echocardiogram. Echocardiography is a painless test that uses sound waves to create images of your heart. It provides your doctor with information about the size and shape of your heart and how well your heart's chambers and valves are working. This procedure takes approximately one hour. There are no restrictions for this procedure.     Follow-Up: At [facility], you and your health needs are our priority.  As part of our continuing mission to provide you with exceptional heart care, we have created designated Provider Care Teams.  These Care Teams include your primary Cardiologist (physician) and Advanced Practice Providers (APPs -  Physician Assistants and Nurse Practitioners) who all work together to provide you with the care you need, when you need it.  We recommend signing up for the patient portal called "MyChart".  Sign up information is provided on this After Visit Summary.  MyChart is used to connect with patients for Virtual Visits (Telemedicine).  Patients are able to view lab/test results, encounter notes, upcoming appointments, etc.  Non-urgent messages can be sent to your  provider as well.   To learn more about what you can do with MyChart, go to [email or web address].    Your next appointment:    2-3 week(s) POST CATH AFTER 05-18-20  The format for your next appointment:   In Person  Provider:   Sherren Mocha, MD           [place]    Other [place] OFFICE [place], [place] [address] Dept: [phone] Loc: [phone]  [name],                         05-12-2020   You are scheduled for a Cardiac Catheterization on Tuesday,May 18, 2020 with Dr.Thomas Claiborne Billings.  1.Please arrive at the [facility] (Main Entrance A) at [facility]: [address] [place], [place] [address] at 5:30 am  (This time is two hours before your procedure to ensure your preparation). Free valet parking service is available.   Special note: Every effort is made to have your procedure done on time. Please understand that emergencies sometimes delay scheduled procedures.  2. Diet: Do not eat solid foods after midnight.  3. Labs: Completed on 04-12-20  [facility] at [facility]. [address],     COVID TESTING AND PROTOCOL COMPLETED  4. Medication instructions  in preparation for your procedure: READ BELOW   Contrast Allergy: No  Stop taking Eliquis (Apixiban) AFTER  Saturday, February 12. resume after procedure Stop taking, Lasix (Furosemide)  Tuesday, February 15, morning of Procedure resume after procedure  On the morning of your procedure, take your Aspirin and any morning medicines NOT listed above.  You may use sips of water.   5. Plan for one night stay--bring personal belongings. 6. Bring a current list of your medications and current insurance cards. 7. You MUST have a responsible person to drive you home. 8. Someone MUST be with you the first 24 hours after you arrive home or your  discharge will be delayed. 9. Please wear clothes that are easy to get on and off and wear slip-on shoes.  Thank you for allowing [place] to care for you!   --  Invasive Cardiovascular services

## 2020-05-13 ENCOUNTER — Ambulatory Visit: Payer: Medicare HMO | Admitting: General Practice

## 2020-05-13 LAB — MAGNESIUM: Magnesium: 2 mg/dL (ref 1.6–2.3)

## 2020-05-13 LAB — PRO B NATRIURETIC PEPTIDE: NT-Pro BNP: 162 pg/mL — ABNORMAL HIGH (ref 0–121)

## 2020-05-13 NOTE — Telephone Encounter (Signed)
Richardson Landry, Interesting. I suspect the first tachy is VT though it is surprisingly narrow but there looks like VA dissociation. I think the VF tachy is just a pause related mechanism resulting in PMVT/VF. I wonder what if anything the Covid had to do with it. Spike protein binding the ACE receptors. I wonder if he could have subclinical myocarditis? Is his device MRI compatible?

## 2020-05-14 ENCOUNTER — Telehealth: Payer: Self-pay | Admitting: Internal Medicine

## 2020-05-14 ENCOUNTER — Ambulatory Visit: Payer: Medicare HMO | Admitting: Physician Assistant

## 2020-05-14 NOTE — Telephone Encounter (Signed)
Will route to [facility] PA, who has seen patient recently.

## 2020-05-14 NOTE — Telephone Encounter (Signed)
Pt c/o medication issue:  1. Name of Medication: amiodarone (PACERONE) 400 MG tablet  2. How are you currently taking this medication (dosage and times per day)? 1 tablet by mouth daily. Started taking yesterday.   3. Are you having a reaction (difficulty breathing--STAT)? Yes   4. What is your medication issue? Hassan Rowan and Sharbel are calling stating Billy [name] began to have a reaction to this medication today. He states he took the medication around 7:00 AM and around 11:00 AM he began to feel lightheaded, weak all over as if he was going to pass out, felt his heart was racing, and his body began to jerk. No symptoms occurred yesterday when taking this medication. Vimal states symptoms had eased off at time of call. Please advise.

## 2020-05-14 NOTE — Telephone Encounter (Signed)
Spoke with patient to send in transmission due to his symptoms for device team and patient stated he was having issuers to send transmission  .Patient was told some one will contact him from device clinic to assist with this problem.

## 2020-05-14 NOTE — Telephone Encounter (Signed)
Spoke with pt, assisted with manual transmission.    Pt denies any new or worsening SOB.  At time of call report dizziness has improved.  This AM after taking medication, pt felt as though he might pass out and his HR was racing.  Per pt and his spouse, they have not checked his BP.    Manual transmission received.  Presenting HR VS58, regular.  No arrythmias logged.  Histograms do show HR has gotten up to 190 although very brief.    Advised Per R.Enedina Finner, PA, pt should hold off on Amiodarone for now given the timing of the symptoms.

## 2020-05-17 ENCOUNTER — Ambulatory Visit: Payer: Medicare HMO | Admitting: Cardiology

## 2020-05-17 NOTE — Progress Notes (Signed)
Spoke with patient regarding procedure instructions.  Confirmed arrival time is 5:30, nothing to eat or drink after midnight, need responsible adult to drive you home as well as stay overnight with you.  Eliquis on hold since Saturday.  Take all medication the morning of procedure including a baby asa. Don't take lasix in the morning.

## 2020-05-18 ENCOUNTER — Encounter (HOSPITAL_COMMUNITY)
Admission: RE | Disposition: A | Discharge status: Home / Self Care | Payer: Self-pay | Source: Home / Self Care | Attending: Cardiovascular Disease

## 2020-05-18 ENCOUNTER — Ambulatory Visit (HOSPITAL_COMMUNITY)
Admission: RE | Admit: 2020-05-18 | Discharge: 2020-05-18 | Disposition: A | Discharge status: Home / Self Care | Payer: Medicare HMO | Attending: Cardiovascular Disease | Admitting: Cardiovascular Disease

## 2020-05-18 ENCOUNTER — Other Ambulatory Visit: Payer: Self-pay

## 2020-05-18 DIAGNOSIS — I255 Ischemic cardiomyopathy: Secondary | ICD-10-CM | POA: Insufficient documentation

## 2020-05-18 DIAGNOSIS — I2582 Chronic total occlusion of coronary artery: Secondary | ICD-10-CM | POA: Insufficient documentation

## 2020-05-18 DIAGNOSIS — I25119 Atherosclerotic heart disease of native coronary artery with unspecified angina pectoris: Secondary | ICD-10-CM | POA: Diagnosis not present

## 2020-05-18 HISTORY — PX: LEFT HEART CATH AND CORS/GRAFTS ANGIOGRAPHY: CATH118250

## 2020-05-18 LAB — BASIC METABOLIC PANEL
Anion gap: 10 (ref 5–15)
BUN: 12 mg/dL (ref 6–20)
CO2: 24 mmol/L (ref 22–32)
Calcium: 9.2 mg/dL (ref 8.9–10.3)
Chloride: 103 mmol/L (ref 98–111)
Creatinine, Ser: 1.11 mg/dL (ref 0.61–1.24)
GFR, Estimated: >60 mL/min (ref >60)
Glucose, Bld: 108 mg/dL — ABNORMAL HIGH (ref 70–99)
Potassium: 4 mmol/L (ref 3.5–5.1)
Sodium: 137 mmol/L (ref 135–145)

## 2020-05-18 SURGERY — LEFT HEART CATH AND CORS/GRAFTS ANGIOGRAPHY
Anesthesia: LOCAL

## 2020-05-18 MED ORDER — CLOPIDOGREL BISULFATE 75 MG PO TABS: 75.0000 mg | ORAL_TABLET | Freq: Every day | ORAL | Status: DC

## 2020-05-18 MED ORDER — HEPARIN (PORCINE) IN NACL 1000-0.9 UT/500ML-% IV SOLN
INTRAVENOUS | Status: DC | PRN
  Administered 2020-05-18: 500 mL

## 2020-05-18 MED ORDER — SODIUM CHLORIDE 0.9 % IV SOLN: 250.0000 mL | INTRAVENOUS | Status: DC | PRN

## 2020-05-18 MED ORDER — ONDANSETRON HCL 4 MG/2ML IJ SOLN: 4.0000 mg | Freq: Four times a day (QID) | INTRAMUSCULAR | Status: DC | PRN

## 2020-05-18 MED ORDER — FENTANYL CITRATE (PF) 100 MCG/2ML IJ SOLN
INTRAMUSCULAR | Status: DC | PRN
  Administered 2020-05-18 (×2): 25 ug via INTRAVENOUS

## 2020-05-18 MED ORDER — SODIUM CHLORIDE 0.9% FLUSH: 3.0000 mL | INTRAVENOUS | Status: DC | PRN

## 2020-05-18 MED ORDER — SODIUM CHLORIDE 0.9 % WEIGHT BASED INFUSION
3.0000 mL/kg/h | INTRAVENOUS | Status: AC
  Administered 2020-05-18: 3 mL/kg/h via INTRAVENOUS

## 2020-05-18 MED ORDER — LABETALOL HCL 5 MG/ML IV SOLN: 10.0000 mg | INTRAVENOUS | Status: DC | PRN

## 2020-05-18 MED ORDER — ASPIRIN 81 MG PO CHEW
81.0000 mg | CHEWABLE_TABLET | ORAL | Status: AC
  Administered 2020-05-18: 81 mg via ORAL
  Filled 2020-05-18: qty 1

## 2020-05-18 MED ORDER — ASPIRIN 81 MG PO CHEW: 81.0000 mg | CHEWABLE_TABLET | ORAL | Status: DC

## 2020-05-18 MED ORDER — SODIUM CHLORIDE 0.9 % WEIGHT BASED INFUSION: 1.0000 mL/kg/h | INTRAVENOUS | Status: DC

## 2020-05-18 MED ORDER — LIDOCAINE HCL (PF) 1 % IJ SOLN
INTRAMUSCULAR | Status: AC
  Filled 2020-05-18: qty 30

## 2020-05-18 MED ORDER — SODIUM CHLORIDE 0.9% FLUSH
3.0000 mL | Freq: Two times a day (BID) | INTRAVENOUS | Status: DC
Start: 2020-05-18 — End: 2020-05-18

## 2020-05-18 MED ORDER — ATORVASTATIN CALCIUM 80 MG PO TABS: 80.0000 mg | ORAL_TABLET | Freq: Every day | ORAL | Status: DC

## 2020-05-18 MED ORDER — HYDRALAZINE HCL 20 MG/ML IJ SOLN: 10.0000 mg | INTRAMUSCULAR | Status: DC | PRN

## 2020-05-18 MED ORDER — FENTANYL CITRATE (PF) 100 MCG/2ML IJ SOLN
INTRAMUSCULAR | Status: AC
  Filled 2020-05-18: qty 2

## 2020-05-18 MED ORDER — VERAPAMIL HCL 2.5 MG/ML IV SOLN
INTRAVENOUS | Status: AC
  Filled 2020-05-18: qty 2

## 2020-05-18 MED ORDER — SODIUM CHLORIDE 0.9% FLUSH: 3.0000 mL | Freq: Two times a day (BID) | INTRAVENOUS | Status: DC

## 2020-05-18 MED ORDER — IOHEXOL 350 MG/ML SOLN
INTRAVENOUS | Status: DC | PRN
  Administered 2020-05-18: 145 mL

## 2020-05-18 MED ORDER — MIDAZOLAM HCL 2 MG/2ML IJ SOLN
INTRAMUSCULAR | Status: AC
  Filled 2020-05-18: qty 2

## 2020-05-18 MED ORDER — DIAZEPAM 5 MG PO TABS: 5.0000 mg | ORAL_TABLET | Freq: Four times a day (QID) | ORAL | Status: DC | PRN

## 2020-05-18 MED ORDER — SODIUM CHLORIDE 0.9 % IV SOLN: INTRAVENOUS | Status: DC

## 2020-05-18 MED ORDER — LIDOCAINE HCL (PF) 1 % IJ SOLN
INTRAMUSCULAR | Status: DC | PRN
  Administered 2020-05-18: 40 mL

## 2020-05-18 MED ORDER — MIDAZOLAM HCL 2 MG/2ML IJ SOLN
INTRAMUSCULAR | Status: DC | PRN
  Administered 2020-05-18: 2 mg via INTRAVENOUS
  Administered 2020-05-18: 1 mg via INTRAVENOUS

## 2020-05-18 MED ORDER — HEPARIN (PORCINE) IN NACL 1000-0.9 UT/500ML-% IV SOLN
INTRAVENOUS | Status: AC
  Filled 2020-05-18: qty 1000

## 2020-05-18 MED ORDER — ACETAMINOPHEN 325 MG PO TABS: 650.0000 mg | ORAL_TABLET | ORAL | Status: DC | PRN

## 2020-05-18 SURGICAL SUPPLY — 18 items
BAG SNAP BAND KOVER 36X36 (MISCELLANEOUS) ×2 IMPLANT
CATH INFINITI 5 FR AL2 (CATHETERS) ×2 IMPLANT
CATH INFINITI 5 FR AR1 MOD (CATHETERS) ×2 IMPLANT
CATH INFINITI 5 FR AR2 MOD (CATHETERS) ×2 IMPLANT
CATH INFINITI 5 FR IM (CATHETERS) ×2 IMPLANT
CATH INFINITI 5 FR MPA2 (CATHETERS) ×2 IMPLANT
CATH INFINITI 5 FR RCB (CATHETERS) ×2 IMPLANT
CATH INFINITI 5FR MULTPACK ANG (CATHETERS) ×2 IMPLANT
CATH LAUNCHER 5F NOTO (CATHETERS) ×1 IMPLANT
CATHETER LAUNCHER 5F NOTO (CATHETERS) ×2
COVER DOME SNAP 22 D (MISCELLANEOUS) ×2 IMPLANT
KIT HEART LEFT (KITS) ×2 IMPLANT
PACK CARDIAC CATHETERIZATION (CUSTOM PROCEDURE TRAY) ×2 IMPLANT
SHEATH PINNACLE 5F 10CM (SHEATH) ×2 IMPLANT
SHEATH PROBE COVER 6X72 (BAG) ×2 IMPLANT
TRANSDUCER W/STOPCOCK (MISCELLANEOUS) ×2 IMPLANT
TUBING CIL FLEX 10 FLL-RA (TUBING) ×2 IMPLANT
WIRE EMERALD ST .035X150CM (WIRE) ×2 IMPLANT

## 2020-05-18 NOTE — Discharge Instructions (Signed)
Femoral Site Care  This sheet gives you information about how to care for yourself after your procedure. Your health care provider may also give you more specific instructions. If you have problems or questions, contact your health care provider. What can I expect after the procedure? After the procedure, it is common to have:  Bruising that usually fades within 1-2 weeks.  Tenderness at the site. Follow these instructions at home: Wound care  Follow instructions from your health care provider about how to take care of your insertion site. Make sure you: ? Wash your hands with soap and water before you change your bandage (dressing). If soap and water are not available, use hand sanitizer. ? Change your dressing as told by your health care provider. ? Leave stitches (sutures), skin glue, or adhesive strips in place. These skin closures may need to stay in place for 2 weeks or longer. If adhesive strip edges start to loosen and curl up, you may trim the loose edges. Do not remove adhesive strips completely unless your health care provider tells you to do that.  Do not take baths, swim, or use a hot tub until your health care provider approves.  You may shower 24-48 hours after the procedure or as told by your health care provider. ? Gently wash the site with plain soap and water. ? Pat the area dry with a clean towel. ? Do not rub the site. This may cause bleeding.  Do not apply powder or lotion to the site. Keep the site clean and dry.  Check your femoral site every day for signs of infection. Check for: ? Redness, swelling, or pain. ? Fluid or blood. ? Warmth. ? Pus or a bad smell. Activity  For the first 2-3 days after your procedure, or as long as directed: ? Avoid climbing stairs as much as possible. ? Do not squat.  Do not lift anything that is heavier than 10 lb (4.5 kg), or the limit that you are told, until your health care provider says that it is safe.  Rest as  directed. ? Avoid sitting for a long time without moving. Get up to take short walks every 1-2 hours.  Do not drive for 24 hours if you were given a medicine to help you relax (sedative). General instructions  Take over-the-counter and prescription medicines only as told by your health care provider.  Keep all follow-up visits as told by your health care provider. This is important. Contact a health care provider if you have:  A fever or chills.  You have redness, swelling, or pain around your insertion site. Get help right away if:  The catheter insertion area swells very fast.  You pass out.  You suddenly start to sweat or your skin gets clammy.  The catheter insertion area is bleeding, and the bleeding does not stop when you hold steady pressure on the area.  The area near or just beyond the catheter insertion site becomes pale, cool, tingly, or numb. These symptoms may represent a serious problem that is an emergency. Do not wait to see if the symptoms will go away. Get medical help right away. Call your local emergency services (911 in the [place] Do not drive yourself to the hospital. Summary  After the procedure, it is common to have bruising that usually fades within 1-2 weeks.  Check your femoral site every day for signs of infection.  Do not lift anything that is heavier than 10 lb (4.5 kg), or   the limit that you are told, until your health care provider says that it is safe. This information is not intended to replace advice given to you by your health care provider. Make sure you discuss any questions you have with your health care provider. Document Revised: 11/21/2019 Document Reviewed: 11/21/2019 Elsevier Patient Education  2021 Elsevier Inc.  

## 2020-05-18 NOTE — Progress Notes (Signed)
Discharge instructions reviewed with patient and family. Verbalized understanding. 

## 2020-05-19 ENCOUNTER — Encounter (HOSPITAL_COMMUNITY): Payer: Self-pay | Admitting: Cardiovascular Disease

## 2020-05-21 ENCOUNTER — Telehealth: Payer: Self-pay | Admitting: Internal Medicine

## 2020-05-21 NOTE — Telephone Encounter (Signed)
Good thoughts   if the tachycardia could be eliminated then the pause could be eliminated I will also increase the lower rate limit for now

## 2020-05-21 NOTE — Telephone Encounter (Signed)
Returned call to patient of Dr. Stanford Breed who had [facility] on 2/15 Patient's wife states he feels "nervous" - this comes/goes She said he sometimes he feels like he is going to faint/lightheaded Patient was able to walk and up down the driveway with no issues No new meds were prescribed post-procedure No bleeding issues - groin site looks fine Patient is staying well-hydrated Patient is NOT taking amiodarone per 2/11 transmission While on the phone, patient is feeling this way Wife checked vitals: 107/68 HR 59 - wrist cuff  Advised that patient send in manual transmission - will route to device clinic team Will route to Dr. Lubertha South. Caryl Comes for any additional advice

## 2020-05-21 NOTE — Telephone Encounter (Signed)
Called patient to assist with sending a manual transmission. No answer,LMOVM.

## 2020-05-21 NOTE — Telephone Encounter (Signed)
Patient reports of lightheaded and dizziness with position change since prior to heart cath on 05/18/20. States he has had 3 episodes today although at this time he feels fine. Denies chest pain, palpitations or shortness of breath. Remote transmission reviewed and normal device fuction. No episdoes logged. Presenting VS 58 bpm. Reports compliance with medications including Bisoprolol 5 mg BID, coreg 25 mg BID, Eliquis 5 mg BID, Lasix 20 mg daily. Is not taking Amiodarone 400 mg BID d/t patient not tolerating well (see note on 05/14/20). Blood sugar was checked at home today ~ 98.   ED precautions advised with verbal understanding.

## 2020-05-21 NOTE — Telephone Encounter (Signed)
Patient called and stated that since procedure, patient has been suffering from nervousness. It comes and goes. Wants to know what is causing it.

## 2020-05-23 DIAGNOSIS — I472 Ventricular tachycardia, unspecified: Secondary | ICD-10-CM | POA: Insufficient documentation

## 2020-05-23 NOTE — Telephone Encounter (Signed)
DC bisoprolol; continue coreg; follow BP Resume amiodaraone 200 mg daily to see if he tolerates lower dose Kirk Ruths

## 2020-05-24 NOTE — Telephone Encounter (Signed)
Advised patient, verbalized understanding  

## 2020-05-25 ENCOUNTER — Other Ambulatory Visit: Payer: Self-pay

## 2020-05-25 ENCOUNTER — Encounter: Payer: Self-pay | Admitting: Cardiovascular Disease

## 2020-05-25 ENCOUNTER — Telehealth (INDEPENDENT_AMBULATORY_CARE_PROVIDER_SITE_OTHER): Payer: Medicare HMO | Admitting: Internal Medicine

## 2020-05-25 ENCOUNTER — Ambulatory Visit (INDEPENDENT_AMBULATORY_CARE_PROVIDER_SITE_OTHER): Payer: Medicare HMO | Admitting: Cardiovascular Disease

## 2020-05-25 ENCOUNTER — Telehealth: Payer: Self-pay

## 2020-05-25 VITALS — BP 94/60 | Ht 71.0 in | Wt 259.0 lb

## 2020-05-25 VITALS — BP 94/72 | HR 62 | Ht 71.0 in | Wt 259.8 lb

## 2020-05-25 DIAGNOSIS — I5022 Chronic systolic (congestive) heart failure: Secondary | ICD-10-CM

## 2020-05-25 DIAGNOSIS — Z95810 Presence of automatic (implantable) cardiac defibrillator: Secondary | ICD-10-CM

## 2020-05-25 DIAGNOSIS — E785 Hyperlipidemia, unspecified: Secondary | ICD-10-CM | POA: Diagnosis not present

## 2020-05-25 DIAGNOSIS — I739 Peripheral vascular disease, unspecified: Secondary | ICD-10-CM

## 2020-05-25 DIAGNOSIS — I4901 Ventricular fibrillation: Secondary | ICD-10-CM | POA: Diagnosis not present

## 2020-05-25 DIAGNOSIS — I10 Essential (primary) hypertension: Secondary | ICD-10-CM

## 2020-05-25 DIAGNOSIS — I255 Ischemic cardiomyopathy: Secondary | ICD-10-CM

## 2020-05-25 DIAGNOSIS — I2510 Atherosclerotic heart disease of native coronary artery without angina pectoris: Secondary | ICD-10-CM | POA: Diagnosis not present

## 2020-05-25 DIAGNOSIS — I480 Paroxysmal atrial fibrillation: Secondary | ICD-10-CM | POA: Diagnosis not present

## 2020-05-25 DIAGNOSIS — I472 Ventricular tachycardia, unspecified: Secondary | ICD-10-CM

## 2020-05-25 MED ORDER — CARVEDILOL 12.5 MG PO TABS: 12.5000 mg | ORAL_TABLET | Freq: Two times a day (BID) | ORAL | 3 refills | Status: DC

## 2020-05-25 MED ORDER — ISOSORBIDE MONONITRATE ER 30 MG PO TB24: 30.0000 mg | ORAL_TABLET | Freq: Every day | ORAL | 3 refills | Status: DC

## 2020-05-25 NOTE — Telephone Encounter (Signed)
  Patient Consent for Virtual Visit         [name] has provided verbal consent on 05/25/2020 for a virtual visit (video or telephone).   CONSENT FOR VIRTUAL VISIT FOR:  Billy [name]  By participating in this virtual visit I agree to the following:  I hereby voluntarily request, consent and authorize [facility] and its employed or contracted physicians, physician assistants, nurse practitioners or other licensed health care professionals (the Practitioner), to provide me with telemedicine health care services (the "Services") as deemed necessary by the treating Practitioner. I acknowledge and consent to receive the Services by the Practitioner via telemedicine. I understand that the telemedicine visit will involve communicating with the Practitioner through live audiovisual communication technology and the disclosure of certain medical information by electronic transmission. I acknowledge that I have been given the opportunity to request an in-person assessment or other available alternative prior to the telemedicine visit and am voluntarily participating in the telemedicine visit.  I understand that I have the right to withhold or withdraw my consent to the use of telemedicine in the course of my care at any time, without affecting my right to future care or treatment, and that the Practitioner or I may terminate the telemedicine visit at any time. I understand that I have the right to inspect all information obtained and/or recorded in the course of the telemedicine visit and may receive copies of available information for a reasonable fee.  I understand that some of the potential risks of receiving the Services via telemedicine include:  [name] Delay or interruption in medical evaluation due to technological equipment failure or disruption; . Information transmitted may not be sufficient (e.g. poor resolution of images) to allow for appropriate medical decision making by the Practitioner;  and/or  . In rare instances, security protocols could fail, causing a breach of personal health information.  Furthermore, I acknowledge that it is my responsibility to provide information about my medical history, conditions and care that is complete and accurate to the best of my ability. I acknowledge that Practitioner's advice, recommendations, and/or decision may be based on factors not within their control, such as incomplete or inaccurate data provided by me or distortions of diagnostic images or specimens that may result from electronic transmissions. I understand that the practice of medicine is not an exact science and that Practitioner makes no warranties or guarantees regarding treatment outcomes. I acknowledge that a copy of this consent can be made available to me via my patient portal ([facility]), or I can request a printed copy by calling the office of [facility].    I understand that my insurance will be billed for this visit.   I have read or had this consent read to me. . I understand the contents of this consent, which adequately explains the benefits and risks of the Services being provided via telemedicine.  . I have been provided ample opportunity to ask questions regarding this consent and the Services and have had my questions answered to my satisfaction. . I give my informed consent for the services to be provided through the use of telemedicine in my medical care

## 2020-05-25 NOTE — Progress Notes (Signed)
Cardiology Office Note   Date:  05/25/2020   ID:  [name], DOB 05-16-1965, MRN [id number]  PCP:  Nicolette Bang, DO  Cardiologist:  Dr. Stanford Breed  No chief complaint on file.     History of Present Illness: Billy [name] is a 55 y.o. male who is here today for follow-up visit regarding peripheral arterial disease.  The patient had previous cardiac arrest in November 2010.  He had previous CABG.  He had an ICD placement done.Other medical problems include hypertension, previous tobacco use , PAD and hyperlipidemia.  He had previous left SFA stent many years ago.    He had unstable angina in October 2020.  Cardiac catheterization showed severe native 2-vessel coronary artery disease.  SVG to RCA and SVG to OM 2 were known to be occluded.  LIMA to LAD was found to be atretic with competitive flow due to minimal disease in the native LAD.  SVG to first diagonal was patent.  There was 80% ostial left circumflex stenosis which was treated with scoring balloon angioplasty.   The patient had brief atrial fibrillation in November and was started on anticoagulation with Eliquis.  Brilinta was switched to Plavix and aspirin was discontinued.  He was seen last year for severe right calf claudication with suspected inflow disease.  I proceeded with angiography in February 21 which showed no significant aortic disease.  No significant iliac disease noted on the left side.  On the right side, there was severe stenosis affecting the proximal external iliac artery with occluded mid SFA with reconstitution distally via collaterals from the profunda.  I performed successful self-expanding stent placement to the right external iliac artery.  Postprocedure vascular studies showed patent right external iliac artery stent with ABI of 0.7 which is expected given his chronic occlusion of the right SFA.  I  He had recent issues with recurrent ICD shocks.  He underwent cardiac catheterization by  Dr. Claiborne Billings which showed significant multivessel CAD with chronic occlusion of first diagonal, mild LAD disease, patent ostial left circumflex, chronic occlusion of the mid AV groove left circumflex with collaterals and chronically occluded right coronary artery with left-to-right collaterals.  LIMA to LAD was atretic as native LAD had no obstructive disease.  He reports no recurrent leg claudication.  He denies chest pain but does complain of dizziness and fatigue with low blood pressure.  Past Medical History:  Diagnosis Date  . Aortic atherosclerosis ([facility])    on CXR  . Blood in stool   . C. difficile colitis    a. remote hx 2010.  . Cardiac arrest - ventricular fibrillation 02/11/2009   a. 02/2009 s/p St Jude ICD.  [name] Chronic systolic CHF (congestive heart failure) ([facility])   . Coronary artery disease    a. PCI to Cx age 27. b. CABGx4 in 2003. c. BMS to [place] in 12/2007.  [name] Former tobacco use   . Hyperlipidemia   . Hypertension   . Ischemic cardiomyopathy   . Marijuana abuse   . Myocardial infarct ([name])    NON-ST-SEGMENT ELEVATION MI  . Obesity   . PAD (peripheral artery disease) ([place])    a. RLE by noninvasive testing - managed medically for now.  . Ventricular tachyarrhythmia ([place])   . Ventricular tachycardia [facility])     Past Surgical History:  Procedure Laterality Date  . ABDOMINAL AORTOGRAM W/LOWER EXTREMITY N/A 05/14/2019   Procedure: ABDOMINAL AORTOGRAM W/LOWER EXTREMITY;  Surgeon: Wellington Hampshire, MD;  Location: [facility]  CV LAB;  Service: Cardiovascular;  Laterality: N/A;  . CARDIAC DEFIBRILLATOR PLACEMENT     STJ single chamber ICD implanted for secondary prevention  . CIRCUMCISION    . CORONARY ARTERY BYPASS GRAFT  06/17/01   X 4  . CORONARY BALLOON ANGIOPLASTY N/A 01/22/2019   Procedure: CORONARY BALLOON ANGIOPLASTY;  Surgeon: Leonie Man, MD;  Location: [facility];  Service: Cardiovascular;  Laterality: N/A;  . LEFT HEART CATH AND CORS/GRAFTS ANGIOGRAPHY N/A  01/22/2019   Procedure: LEFT HEART CATH AND CORS/GRAFTS ANGIOGRAPHY;  Surgeon: Leonie Man, MD;  Location: [facility] CV LAB;  Service: Cardiovascular;  Laterality: N/A;  . LEFT HEART CATH AND CORS/GRAFTS ANGIOGRAPHY N/A 05/18/2020   Procedure: LEFT HEART CATH AND CORS/GRAFTS ANGIOGRAPHY;  Surgeon: Troy Sine, MD;  Location: [facility] CV LAB;  Service: Cardiovascular;  Laterality: N/A;  . PERIPHERAL VASCULAR INTERVENTION Right 05/14/2019   Procedure: PERIPHERAL VASCULAR INTERVENTION;  Surgeon: Wellington Hampshire, MD;  Location: [facility] CV LAB;  Service: Cardiovascular;  Laterality: Right;     Current Outpatient Medications  Medication Sig Dispense Refill  . acetaminophen (TYLENOL) 500 MG tablet Take 1,000 mg by mouth every 6 (six) hours as needed for moderate pain or headache.    [name] amiodarone (PACERONE) 400 MG tablet Take 400 mg by mouth as directed. Take 1/2 tablet by mouth daily    . atorvastatin (LIPITOR) 80 MG tablet TAKE 1 TABLET BY MOUTH EVERY DAY 90 tablet 3  . benzonatate (TESSALON) 100 MG capsule Take 1 capsule (100 mg total) by mouth 2 (two) times daily as needed for cough. 20 capsule 0  . carvedilol (COREG) 25 MG tablet TAKE 1 TABLET BY MOUTH TWICE A DAY 180 tablet 3  . clopidogrel (PLAVIX) 75 MG tablet Take 1 tablet (75 mg total) by mouth daily. 90 tablet 3  . ELIQUIS 5 MG TABS tablet TAKE 1 TABLET BY MOUTH TWICE A DAY 180 tablet 3  . ezetimibe (ZETIA) 10 MG tablet TAKE 1 TABLET BY MOUTH EVERY DAY 90 tablet 3  . fluticasone (FLONASE) 50 MCG/ACT nasal spray Place 2 sprays into both nostrils daily as needed for allergies or rhinitis. 16 g 11  . furosemide (LASIX) 20 MG tablet TAKE 1 TABLET BY MOUTH EVERY DAY 90 tablet 3  . isosorbide mononitrate (IMDUR) 60 MG 24 hr tablet Take 1 tablet (60 mg total) by mouth daily. 90 tablet 3  . lisinopril (ZESTRIL) 2.5 MG tablet Take 1 tablet (2.5 mg total) by mouth daily. 90 tablet 3  . montelukast (SINGULAIR) 10 MG tablet Take 1  tablet (10 mg total) by mouth daily as needed (allergies). 90 tablet 1  . Multiple Vitamin (MULTIVITAMIN WITH MINERALS) TABS tablet Take 1 tablet by mouth daily.    . nitroGLYCERIN (NITROSTAT) 0.4 MG SL tablet PLACE 1 TABLET (0.4 MG TOTAL) UNDER THE TONGUE EVERY 5 (FIVE) MINUTES AS NEEDED FOR CHEST PAIN. 75 tablet 2  . pantoprazole (PROTONIX) 40 MG tablet Take 1 tablet (40 mg total) by mouth daily. 90 tablet 1  . potassium chloride SA (KLOR-CON M20) 20 MEQ tablet Take 1 tablet (20 mEq total) by mouth daily. 90 tablet 3   No current facility-administered medications for this visit.    Allergies:   Patient has no known allergies.    Social History:  The patient  reports that he quit smoking about 17 years ago. He has a 40.00 pack-year smoking history. He has never used smokeless tobacco. He reports that he  does not drink alcohol and does not use drugs.   Family History:  The patient's family history includes Heart attack in his father; Heart disease in his sister; Hypertension in his mother and another family member.    ROS:  Please see the history of present illness.   Otherwise, review of systems are positive for none.   All other systems are reviewed and negative.    PHYSICAL EXAM: VS:  BP 94/72 (BP Location: Left Arm, Patient Position: Sitting)   Pulse 62   Ht 5\' 11"  (1.803 m)   Wt 259 lb 12.8 oz (117.8 kg)   SpO2 97%   BMI 36.23 kg/m  , BMI Body mass index is 36.23 kg/m. GEN: Well nourished, well developed, in no acute distress  HEENT: normal  Neck: no JVD, carotid bruits, or masses Cardiac: RRR; no murmurs, rubs, or gallops,no edema  Respiratory:  clear to auscultation bilaterally, normal work of breathing GI: soft, nontender, nondistended, + BS MS: no deformity or atrophy  Skin: warm and dry, no rash Neuro:  Strength and sensation are intact Psych: euthymic mood, full affect Vascular: No groin hematoma on the right side.  Femoral pulses normal bilaterally.   EKG:  EKG  is ordered today. EKG showed normal sinus rhythm with possible left atrial enlargement.  No significant ST or T wave changes.  Recent Labs: 10/15/2019: ALT 28; TSH 2.130 05/10/2020: Hemoglobin 16.5; Platelets 269 05/12/2020: Magnesium 2.0; NT-Pro BNP 162 05/18/2020: BUN 12; Creatinine, Ser 1.11; Potassium 4.0; Sodium 137    Lipid Panel    Component Value Date/Time   CHOL 119 10/09/2019 0959   TRIG 59 10/09/2019 0959   HDL 32 (L) 10/09/2019 0959   CHOLHDL 3.7 10/09/2019 0959   CHOLHDL 3.9 01/23/2019 0515   VLDL 14 01/23/2019 0515   LDLCALC 74 10/09/2019 0959      Wt Readings from Last 3 Encounters:  05/25/20 259 lb 12.8 oz (117.8 kg)  05/18/20 258 lb (117 kg)  05/12/20 258 lb (117 kg)       No flowsheet data found.    ASSESSMENT AND PLAN:  1.  Peripheral arterial disease right lower extremity claudication: Status post stent placement to the right external iliac artery with .  He has known occlusion of the right SFA .  However, at the present time he denies claudication.  I recommend continuing medical therapy.  He is due for an ABI and aortoiliac duplex.  2.  Coronary artery disease involving native coronary arteries without angina: Recent catheterization showed no new obstructive disease.  3.  Chronic systolic heart failure: He appears to be euvolemic and currently on optimal medical therapy.  He does complain of fatigue and dizziness with low blood pressure.  I elected to decrease Imdur to 30 mg daily.  4.  Hyperlipidemia: Continue treatment with atorvastatin and Zetia with a target LDL of less than 70.  5.  Paroxysmal atrial fibrillation: He appears to be in sinus rhythm.  He is currently on anticoagulation with Eliquis.  7.  Recurrent ICD shocks: Currently on amiodarone.  He has a follow-up appointment with Dr. Caryl Comes today.   Disposition: Follow-up with me in 12 months.  Signed,  [name], MD  05/25/2020 8:25 AM    [place]

## 2020-05-25 NOTE — Patient Instructions (Signed)
Medication Instructions:  DECREASE the Imdur to 30 mg once daily  *If you need a refill on your cardiac medications before your next appointment, please call your pharmacy*   Lab Work: None ordered If you have labs (blood work) drawn today and your tests are completely normal, you will receive your results only by: [name] MyChart Message (if you have MyChart) OR . A paper copy in the mail If you have any lab test that is abnormal or we need to change your treatment, we will call you to review the results.   Testing/Procedures: Your physician has requested that you have an ankle brachial index (ABI). During this test an ultrasound and blood pressure cuff are used to evaluate the arteries that supply the arms and legs with blood. Allow thirty minutes for this exam. There are no restrictions or special instructions. This will take place at [place], [address].   Your physician has requested that you have an Aorta/Iliac Duplex. This will be take place at [place], [address].    No food after 11PM the night before.  Water is OK. (Don't drink liquids if you have been instructed not to for ANOTHER test).  Take two Extra-Strength Gas-X capsules at bedtime the night before test.   Take an additional two Extra-Strength Gas-X capsules three (3) hours before the test or first thing in the morning.    Avoid foods that produce bowel gas, for 24 hours prior to exam (see below).    No breakfast, no chewing gum, no smoking or carbonated beverages.  Patient may take morning medications with water.  Come in for test at least 15 minutes early to register.    Follow-Up: At [facility], you and your health needs are our priority.  As part of our continuing mission to provide you with exceptional heart care, we have created designated Provider Care Teams.  These Care Teams include your primary Cardiologist (physician) and Advanced Practice Providers (APPs -  Physician Assistants and  Nurse Practitioners) who all work together to provide you with the care you need, when you need it.  We recommend signing up for the patient portal called "MyChart".  Sign up information is provided on this After Visit Summary.  MyChart is used to connect with patients for Virtual Visits (Telemedicine).  Patients are able to view lab/test results, encounter notes, upcoming appointments, etc.  Non-urgent messages can be sent to your provider as well.   To learn more about what you can do with MyChart, go to [email or web address].    Your next appointment:   12 month(s)  The format for your next appointment:   In Person  Provider:   [name], MD

## 2020-05-25 NOTE — Progress Notes (Signed)
Electrophysiology TeleHealth Note   Due to national recommendations of social distancing due to COVID 19, an audio/video telehealth visit is felt to be most appropriate for this patient at this time.  See MyChart message from today for the patient's consent to telehealth for [facility].   Date:  05/25/2020   ID:  Billy [name], DOB April 29, 1965, MRN [id number]  Location: patient's home  Provider location: [address] [place]  Evaluation Performed: Follow-up visit  PCP:  Nicolette Bang, DO  Cardiologist:    bc Electrophysiologist:  SK   Chief Complaint:  icd shocks   History of Present Illness:    Billy [name] is a 55 y.o. male who presents via audio/video conferencing for a telehealth visit today.  Since last being seen in our clinic, the patient reports recurrent shocks,. Unassociated with activity  Some chest pain, recently cathed (See Below)  And modest DOE stable  Orthostatic lightheadedness w some presyncope BP this am was 90 and isosorbide dose was reduced    DATE TEST EF   12/09 cMRI  35 % Large infarct  5/19 MYOVIEW  30 % Large infarct  10/20 LHC 35-40% LIMA-T; LAD-min SVG-RCA-T;SVG-OM2-T; SVG-D1 40; Cx-stent-patent; Cx-80>>40 (angio) RCAp-70, RCA m-85/100%  10/20 Echo 50-55% InferoApical HK  2/22 LHC  LIMA-T; LADM-35/20;CXp-100; RCAm-100 SVG-RCA-100;SVG-OM2-100;SVG-D1-40     Date Cr K Mg Hgb  7/17 1.17 4.2  15.2  4/19 1.03 3.9  16.7  2/21 1.26 4.7  15.6  2/22 1.11 4.0 2.0 16.5     The patient denies symptoms of fevers, chills, cough, or new SOB worrisome for COVID 19.    Past Medical History:  Diagnosis Date  . Aortic atherosclerosis ([place])    on CXR  . Blood in stool   . C. difficile colitis    a. remote hx 2010.  . Cardiac arrest - ventricular fibrillation 02/11/2009   a. 02/2009 s/p St Jude ICD.  [name] Chronic systolic CHF (congestive heart failure) ([place])   . Coronary artery disease    a. PCI to Cx age 15. b.  CABGx4 in 2003. c. BMS to [place] in 12/2007.  [name] Former tobacco use   . Hyperlipidemia   . Hypertension   . Ischemic cardiomyopathy   . Marijuana abuse   . Myocardial infarct ([name])    NON-ST-SEGMENT ELEVATION MI  . Obesity   . PAD (peripheral artery disease) ([place])    a. RLE by noninvasive testing - managed medically for now.  . Ventricular tachyarrhythmia ([place])   . Ventricular tachycardia [facility])     Past Surgical History:  Procedure Laterality Date  . ABDOMINAL AORTOGRAM W/LOWER EXTREMITY N/A 05/14/2019   Procedure: ABDOMINAL AORTOGRAM W/LOWER EXTREMITY;  Surgeon: Wellington [place], MD;  Location: [place] [facility];  Service: Cardiovascular;  Laterality: N/A;  . CARDIAC DEFIBRILLATOR PLACEMENT     STJ single chamber ICD implanted for secondary prevention  . CIRCUMCISION    . CORONARY ARTERY BYPASS GRAFT  06/17/01   X 4  . CORONARY BALLOON ANGIOPLASTY N/A 01/22/2019   Procedure: CORONARY BALLOON ANGIOPLASTY;  Surgeon: Leonie Man, MD;  Location: [place] [facility];  Service: Cardiovascular;  Laterality: N/A;  . LEFT HEART CATH AND CORS/GRAFTS ANGIOGRAPHY N/A 01/22/2019   Procedure: LEFT HEART CATH AND CORS/GRAFTS ANGIOGRAPHY;  Surgeon: Leonie Man, MD;  Location: [place] [facility];  Service: Cardiovascular;  Laterality: N/A;  . LEFT HEART CATH AND CORS/GRAFTS ANGIOGRAPHY N/A 05/18/2020   Procedure: LEFT HEART  CATH AND CORS/GRAFTS ANGIOGRAPHY;  Surgeon: Troy Sine, MD;  Location: [place] [facility];  Service: Cardiovascular;  Laterality: N/A;  . PERIPHERAL VASCULAR INTERVENTION Right 05/14/2019   Procedure: PERIPHERAL VASCULAR INTERVENTION;  Surgeon: Wellington [place], MD;  Location: [place] [facility];  Service: Cardiovascular;  Laterality: Right;    Current Outpatient Medications  Medication Sig Dispense Refill  . acetaminophen (TYLENOL) 500 MG tablet Take 1,000 mg by mouth every 6 (six) hours as needed for moderate pain or headache.    [name] amiodarone (PACERONE) 400 MG  tablet Take 400 mg by mouth as directed. Take 1/2 tablet by mouth daily    . atorvastatin (LIPITOR) 80 MG tablet TAKE 1 TABLET BY MOUTH EVERY DAY 90 tablet 3  . benzonatate (TESSALON) 100 MG capsule Take 1 capsule (100 mg total) by mouth 2 (two) times daily as needed for cough. 20 capsule 0  . carvedilol (COREG) 25 MG tablet TAKE 1 TABLET BY MOUTH TWICE A DAY 180 tablet 3  . clopidogrel (PLAVIX) 75 MG tablet Take 1 tablet (75 mg total) by mouth daily. 90 tablet 3  . ELIQUIS 5 MG TABS tablet TAKE 1 TABLET BY MOUTH TWICE A DAY 180 tablet 3  . ezetimibe (ZETIA) 10 MG tablet TAKE 1 TABLET BY MOUTH EVERY DAY 90 tablet 3  . fluticasone (FLONASE) 50 MCG/ACT nasal spray Place 2 sprays into both nostrils daily as needed for allergies or rhinitis. 16 g 11  . furosemide (LASIX) 20 MG tablet TAKE 1 TABLET BY MOUTH EVERY DAY 90 tablet 3  . isosorbide mononitrate (IMDUR) 30 MG 24 hr tablet Take 1 tablet (30 mg total) by mouth daily. 90 tablet 3  . lisinopril (ZESTRIL) 2.5 MG tablet Take 1 tablet (2.5 mg total) by mouth daily. 90 tablet 3  . montelukast (SINGULAIR) 10 MG tablet Take 1 tablet (10 mg total) by mouth daily as needed (allergies). 90 tablet 1  . Multiple Vitamin (MULTIVITAMIN WITH MINERALS) TABS tablet Take 1 tablet by mouth daily.    . nitroGLYCERIN (NITROSTAT) 0.4 MG SL tablet PLACE 1 TABLET (0.4 MG TOTAL) UNDER THE TONGUE EVERY 5 (FIVE) MINUTES AS NEEDED FOR CHEST PAIN. 75 tablet 2  . pantoprazole (PROTONIX) 40 MG tablet Take 1 tablet (40 mg total) by mouth daily. 90 tablet 1  . potassium chloride SA (KLOR-CON M20) 20 MEQ tablet Take 1 tablet (20 mEq total) by mouth daily. 90 tablet 3   No current facility-administered medications for this visit.    Allergies:   Patient has no known allergies.   Social History:  The patient  reports that he quit smoking about 17 years ago. He has a 40.00 pack-year smoking history. He has never used smokeless tobacco. He reports that he does not drink alcohol  and does not use drugs.   Family History:  The patient's   family history includes Heart attack in his father; Heart disease in his sister; Hypertension in his mother and another family member.   ROS:  Please see the history of present illness.   All other systems are personally reviewed and negative.    Exam:    Vital Signs:  BP 94/60   Ht 5\' 11"  (1.803 m)   Wt 259 lb (117.5 kg)   BMI 36.12 kg/m   Well appearing, alert and conversant, regular work of breathing,  good skin color Eyes- anicteric, neuro- grossly intact, skin- no apparent rash or lesions or cyanosis, mouth- oral mucosa is pink   Labs/Other  Tests and Data Reviewed:    Recent Labs: 10/15/2019: ALT 28; TSH 2.130 05/10/2020: Hemoglobin 16.5; Platelets 269 05/12/2020: Magnesium 2.0; NT-Pro BNP 162 05/18/2020: BUN 12; Creatinine, Ser 1.11; Potassium 4.0; Sodium 137   Wt Readings from Last 3 Encounters:  05/25/20 259 lb (117.5 kg)  05/25/20 259 lb 12.8 oz (117.8 kg)  05/18/20 258 lb (117 kg)     Other studies personally reviewed:  *  Last device remote is reviewed from [facility] PDF dated 2/22which reveals normal device function,   arrhythmias -2 episodes of tachycardia presumed SVT associated with termination and long short coupling of the polymorphic ventricular tachycardia   ASSESSMENT & PLAN:   Aborted cardiac arrest     Ischemic heart disease with prior bypass surgery recent angioplasty  Hyperlipidemia  Defibrillator-St. Jude   Intercurrent therapy SVT-inappropriate with acceleration  Intercurrent appropriate therapy for a long short initiated polymorphic ventricular tachycardia in the context of SVT-presumed possible VT  Lightheadedness and hypotension   we disucssed the role of triggering and pause to initiate VTPM and the role of ablation to eliminate the tachycardia trigger  I have asked colleagues to review the tachycardia and putative mechanism; there is a difference of opinion as to whether this  represents SVT or VT  I think this will need to be clarified at EPS and hopefully a targetable mechanism will be evident  We discussed the risks and benefits of the procedure in general and after a lengthy discussion he and his wife are agreeable to proceeding which will begin with inoffice consultation   With presyncopal LH and BP of 90 mm this am, will decrease carvedilol from 25>>12.5 mg bid   COVID 19 screen The patient denies symptoms of COVID 19 at this time.  The importance of social distancing was discussed today.  Follow-up:  71m Next remote:( As Scheduled  Current medicines are reviewed at length with the patient today.   The patient does not have concerns regarding his medicines.  The following changes were made today:  Decrease  Carvedilol 12.5 bid Labs/ tests ordered today include: EPS and RFCA witg TAYLOR No orders of the defined types were placed in this encounter.      Patient Risk:  after full review of this patients clinical status, I feel that they are at moderate risk at this time.  Today, I have spent 18 minutes with the patient with telehealth technology discussing the above.  Signed, Virl Axe, MD  05/25/2020 2:15 PM     [address] [place]  [phone] (office) [phone] (fax)

## 2020-05-25 NOTE — Patient Instructions (Addendum)
Medication Instructions:  Your physician has recommended you make the following change in your medication:   Begin taking Carvedilol 12.5mg  - 1 tablet by mouth twice daily. New prescription sent to pharmacy on file.   *If you need a refill on your cardiac medications before your next appointment, please call your pharmacy*   Lab Work: None ordered.  If you have labs (blood work) drawn today and your tests are completely normal, you will receive your results only by: [name] MyChart Message (if you have MyChart) OR . A paper copy in the mail If you have any lab test that is abnormal or we need to change your treatment, we will call you to review the results.   Testing/Procedures: Referred to Dr Cristopher Peru for EP study and ablation.  Someone will call you to schedule appointment.   Follow-Up: At [facility], you and your health needs are our priority.  As part of our continuing mission to provide you with exceptional heart care, we have created designated Provider Care Teams.  These Care Teams include your primary Cardiologist (physician) and Advanced Practice Providers (APPs -  Physician Assistants and Nurse Practitioners) who all work together to provide you with the care you need, when you need it.  We recommend signing up for the patient portal called "MyChart".  Sign up information is provided on this After Visit Summary.  MyChart is used to connect with patients for Virtual Visits (Telemedicine).  Patients are able to view lab/test results, encounter notes, upcoming appointments, etc.  Non-urgent messages can be sent to your provider as well.   To learn more about what you can do with MyChart, go to [email or web address].    Your next appointment:   6 month(s)  The format for your next appointment:   In Person  Provider:   Virl Axe, MD

## 2020-05-26 ENCOUNTER — Other Ambulatory Visit: Payer: Self-pay | Admitting: *Deleted

## 2020-05-26 ENCOUNTER — Ambulatory Visit (INDEPENDENT_AMBULATORY_CARE_PROVIDER_SITE_OTHER): Payer: Medicare HMO

## 2020-05-26 DIAGNOSIS — I4901 Ventricular fibrillation: Secondary | ICD-10-CM

## 2020-05-26 DIAGNOSIS — I739 Peripheral vascular disease, unspecified: Secondary | ICD-10-CM

## 2020-05-27 ENCOUNTER — Other Ambulatory Visit: Payer: Self-pay | Admitting: Internal Medicine

## 2020-05-27 LAB — CUP PACEART REMOTE DEVICE CHECK
Battery Remaining Longevity: 13 mo
Battery Remaining Percentage: 10 %
Battery Voltage: 2.69 V
Brady Statistic RV Percent Paced: 1 %
Date Time Interrogation Session: 20220223033053
HighPow Impedance: 58 Ohm
Implantable Lead Implant Date: 20101111
Implantable Lead Location: 753860
Implantable Lead Model: 7121
Implantable Pulse Generator Implant Date: 20101111
Lead Channel Impedance Value: 510 Ohm
Lead Channel Pacing Threshold Amplitude: 1 V
Lead Channel Pacing Threshold Pulse Width: 0.5 ms
Lead Channel Sensing Intrinsic Amplitude: 11.7 mV
Lead Channel Setting Pacing Amplitude: 2.5 V
Lead Channel Setting Pacing Pulse Width: 0.5 ms
Lead Channel Setting Sensing Sensitivity: 0.5 mV
Pulse Gen Serial Number: 743367

## 2020-05-31 ENCOUNTER — Other Ambulatory Visit: Payer: Self-pay | Admitting: Cardiovascular Disease

## 2020-05-31 DIAGNOSIS — I739 Peripheral vascular disease, unspecified: Secondary | ICD-10-CM

## 2020-06-01 ENCOUNTER — Ambulatory Visit (INDEPENDENT_AMBULATORY_CARE_PROVIDER_SITE_OTHER): Payer: Medicare HMO | Admitting: Cardiology

## 2020-06-01 ENCOUNTER — Encounter: Payer: Self-pay | Admitting: Cardiology

## 2020-06-01 ENCOUNTER — Ambulatory Visit
Admission: EM | Admit: 2020-06-01 | Discharge: 2020-06-01 | Disposition: A | Discharge status: Home / Self Care | Payer: Medicare HMO | Attending: Urgent Care | Admitting: Urgent Care

## 2020-06-01 ENCOUNTER — Encounter: Payer: Self-pay | Admitting: Emergency Medicine

## 2020-06-01 ENCOUNTER — Other Ambulatory Visit: Payer: Self-pay

## 2020-06-01 DIAGNOSIS — J019 Acute sinusitis, unspecified: Secondary | ICD-10-CM

## 2020-06-01 DIAGNOSIS — I472 Ventricular tachycardia, unspecified: Secondary | ICD-10-CM

## 2020-06-01 DIAGNOSIS — I509 Heart failure, unspecified: Secondary | ICD-10-CM | POA: Insufficient documentation

## 2020-06-01 DIAGNOSIS — Z951 Presence of aortocoronary bypass graft: Secondary | ICD-10-CM | POA: Diagnosis not present

## 2020-06-01 HISTORY — DX: Heart failure, unspecified: I50.9

## 2020-06-01 MED ORDER — CETIRIZINE HCL 10 MG PO TABS: 10.0000 mg | ORAL_TABLET | Freq: Every day | ORAL | 0 refills | Status: DC

## 2020-06-01 MED ORDER — AMOXICILLIN-POT CLAVULANATE 875-125 MG PO TABS: 1.0000 | ORAL_TABLET | Freq: Two times a day (BID) | ORAL | 0 refills | Status: DC

## 2020-06-01 MED ORDER — FUROSEMIDE 40 MG PO TABS: 40.0000 mg | ORAL_TABLET | Freq: Every day | ORAL | 3 refills | Status: DC

## 2020-06-01 NOTE — Assessment & Plan Note (Signed)
H/O remote CABG and subsequent PCIs.  Cath done 05/18/2020 shows extensive collateralization- medical Rx

## 2020-06-01 NOTE — Assessment & Plan Note (Signed)
Recurrent VT- Dr Lovena Le to see- no recent ICD discharges

## 2020-06-01 NOTE — ED Provider Notes (Signed)
[facility]   MRN: [id number] DOB: 10-02-1965  Subjective:   Billy [name] is a 55 y.o. male presenting for 2-week history of persistent sinus pressure, sinus congestion now having pain over the left side of his face.  Denies fever, chills, cough, chest pain, shortness of breath.  States that the congestion is most bothersome at night.  Has used over-the-counter measures with minimal relief.  He did have COVID-19 testing in the past 2 weeks and was negative.  No current facility-administered medications for this encounter.  Current Outpatient Medications:  .  acetaminophen (TYLENOL) 500 MG tablet, Take 1,000 mg by mouth every 6 (six) hours as needed for moderate pain or headache., Disp: , Rfl:  .  amiodarone (PACERONE) 400 MG tablet, Take 400 mg by mouth as directed. Take 1/2 tablet by mouth daily, Disp: , Rfl:  .  atorvastatin (LIPITOR) 80 MG tablet, TAKE 1 TABLET BY MOUTH EVERY DAY, Disp: 90 tablet, Rfl: 3 .  benzonatate (TESSALON) 100 MG capsule, Take 1 capsule (100 mg total) by mouth 2 (two) times daily as needed for cough., Disp: 20 capsule, Rfl: 0 .  carvedilol (COREG) 12.5 MG tablet, Take 1 tablet (12.5 mg total) by mouth 2 (two) times daily with a meal., Disp: 180 tablet, Rfl: 3 .  clopidogrel (PLAVIX) 75 MG tablet, Take 1 tablet (75 mg total) by mouth daily., Disp: 90 tablet, Rfl: 3 .  ELIQUIS 5 MG TABS tablet, TAKE 1 TABLET BY MOUTH TWICE A DAY, Disp: 180 tablet, Rfl: 3 .  ezetimibe (ZETIA) 10 MG tablet, TAKE 1 TABLET BY MOUTH EVERY DAY, Disp: 90 tablet, Rfl: 3 .  fluticasone (FLONASE) 50 MCG/ACT nasal spray, Place 2 sprays into both nostrils daily as needed for allergies or rhinitis., Disp: 16 g, Rfl: 11 .  furosemide (LASIX) 40 MG tablet, Take 1 tablet (40 mg total) by mouth daily., Disp: 90 tablet, Rfl: 3 .  isosorbide mononitrate (IMDUR) 30 MG 24 hr tablet, Take 1 tablet (30 mg total) by mouth daily., Disp: 90 tablet, Rfl: 3 .  lisinopril (ZESTRIL) 2.5 MG tablet,  Take 1 tablet (2.5 mg total) by mouth daily., Disp: 90 tablet, Rfl: 3 .  montelukast (SINGULAIR) 10 MG tablet, Take 1 tablet (10 mg total) by mouth daily as needed (allergies)., Disp: 90 tablet, Rfl: 1 .  Multiple Vitamin (MULTIVITAMIN WITH MINERALS) TABS tablet, Take 1 tablet by mouth daily., Disp: , Rfl:  .  nitroGLYCERIN (NITROSTAT) 0.4 MG SL tablet, PLACE 1 TABLET (0.4 MG TOTAL) UNDER THE TONGUE EVERY 5 (FIVE) MINUTES AS NEEDED FOR CHEST PAIN., Disp: 75 tablet, Rfl: 2 .  pantoprazole (PROTONIX) 40 MG tablet, Take 1 tablet (40 mg total) by mouth daily., Disp: 90 tablet, Rfl: 1 .  potassium chloride SA (KLOR-CON M20) 20 MEQ tablet, Take 1 tablet (20 mEq total) by mouth daily., Disp: 90 tablet, Rfl: 3   No Known Allergies  Past Medical History:  Diagnosis Date  . Aortic atherosclerosis ([place])    on CXR  . Blood in stool   . C. difficile colitis    a. remote hx 2010.  . Cardiac arrest - ventricular fibrillation 02/11/2009   a. 02/2009 s/p St Jude ICD.  [name] Chronic systolic CHF (congestive heart failure) ([place])   . Coronary artery disease    a. PCI to Cx age 26. b. CABGx4 in 2003. c. BMS to [place] in 12/2007.  [name] Former tobacco use   . Hyperlipidemia   . Hypertension   . Ischemic cardiomyopathy   .  Marijuana abuse   . Myocardial infarct ([name])    NON-ST-SEGMENT ELEVATION MI  . Obesity   . PAD (peripheral artery disease) ([place])    a. RLE by noninvasive testing - managed medically for now.  . Ventricular tachyarrhythmia ([place])   . Ventricular tachycardia [facility])      Past Surgical History:  Procedure Laterality Date  . ABDOMINAL AORTOGRAM W/LOWER EXTREMITY N/A 05/14/2019   Procedure: ABDOMINAL AORTOGRAM W/LOWER EXTREMITY;  Surgeon: Wellington [place], MD;  Location: [place] [facility];  Service: Cardiovascular;  Laterality: N/A;  . CARDIAC DEFIBRILLATOR PLACEMENT     STJ single chamber ICD implanted for secondary prevention  . CIRCUMCISION    . CORONARY ARTERY BYPASS GRAFT  06/17/01   X 4  .  CORONARY BALLOON ANGIOPLASTY N/A 01/22/2019   Procedure: CORONARY BALLOON ANGIOPLASTY;  Surgeon: Leonie Man, MD;  Location: [place] [facility];  Service: Cardiovascular;  Laterality: N/A;  . LEFT HEART CATH AND CORS/GRAFTS ANGIOGRAPHY N/A 01/22/2019   Procedure: LEFT HEART CATH AND CORS/GRAFTS ANGIOGRAPHY;  Surgeon: Leonie Man, MD;  Location: [place] [facility];  Service: Cardiovascular;  Laterality: N/A;  . LEFT HEART CATH AND CORS/GRAFTS ANGIOGRAPHY N/A 05/18/2020   Procedure: LEFT HEART CATH AND CORS/GRAFTS ANGIOGRAPHY;  Surgeon: Troy Sine, MD;  Location: [place] [facility];  Service: Cardiovascular;  Laterality: N/A;  . PERIPHERAL VASCULAR INTERVENTION Right 05/14/2019   Procedure: PERIPHERAL VASCULAR INTERVENTION;  Surgeon: Wellington [place], MD;  Location: [place] [facility];  Service: Cardiovascular;  Laterality: Right;    Family History  Problem Relation Age of Onset  . Hypertension Mother   . Heart attack Father        DIED AT 86 FROM MI  . Heart disease Sister        CAD AND PREVIOUS CABG  . Hypertension Other        IN MOST OF HIS SIBLINGS    Social History   Tobacco Use  . Smoking status: Former Smoker    Packs/day: 2.00    Years: 20.00    Pack years: 40.00    Quit date: 04/04/2003    Years since quitting: 17.1  . Smokeless tobacco: Never Used  Vaping Use  . Vaping Use: Never used  Substance Use Topics  . Alcohol use: No  . Drug use: No    ROS   Objective:   Vitals: BP 105/72 (BP Location: Right Arm)   Pulse 62   Temp 97.7 F (36.5 C) (Oral)   Resp 16   SpO2 97%   Physical Exam Constitutional:      General: He is not in acute distress.    Appearance: Normal appearance. He is well-developed. He is not ill-appearing, toxic-appearing or diaphoretic.  HENT:     Head: Normocephalic and atraumatic.     Right Ear: External ear normal.     Left Ear: External ear normal.     Nose: Congestion and rhinorrhea present.     Comments: Left-sided  maxillary sinus tenderness.    Mouth/Throat:     Mouth: Mucous membranes are moist.     Pharynx: Oropharynx is clear.  Eyes:     General: No scleral icterus.       Right eye: No discharge.        Left eye: No discharge.     Extraocular Movements: Extraocular movements intact.     Conjunctiva/sclera: Conjunctivae normal.     Pupils: Pupils are equal, round, and reactive to light.  Cardiovascular:     Rate and Rhythm: Normal rate and regular rhythm.     Heart sounds: Normal heart sounds. No murmur heard. No friction rub. No gallop.   Pulmonary:     Effort: Pulmonary effort is normal. No respiratory distress.     Breath sounds: Normal breath sounds. No stridor. No wheezing, rhonchi or rales.  Neurological:     Mental Status: He is alert and oriented to person, place, and time.  Psychiatric:        Mood and Affect: Mood normal.        Behavior: Behavior normal.        Thought Content: Thought content normal.        Judgment: Judgment normal.       Assessment and Plan :   PDMP not reviewed this encounter.  1. Acute non-recurrent sinusitis, unspecified location    Will start empiric treatment for sinusitis with Augmentin.  Recommended supportive care otherwise including the use of oral antihistamine, Tylenol for pain control. Counseled patient on potential for adverse effects with medications prescribed/recommended today, ER and return-to-clinic precautions discussed, patient verbalized understanding.    Jaynee Eagles, PA-C 06/01/20 1146

## 2020-06-01 NOTE — ED Triage Notes (Signed)
Pt said x 2 weeks has been having nasal congestion and pressure in his eyes and facial area. Has been taking cold and congestion meds with no relief/ pt sai d no fevers, no chills ,no cough. Harder to sleep at night. Nothing coming out when he blows, very congested.

## 2020-06-01 NOTE — Patient Instructions (Signed)
Medication Instructions:  INCREASE- Furosemide 40 mg take 40 mg by mouth twice a day for 2 days then 40 mg daily  *If you need a refill on your cardiac medications before your next appointment, please call your pharmacy*   Lab Work: None Ordered  Testing/Procedures: None Ordered   Follow-Up: At [facility], you and your health needs are our priority.  As part of our continuing mission to provide you with exceptional heart care, we have created designated Provider Care Teams.  These Care Teams include your primary Cardiologist (physician) and Advanced Practice Providers (APPs -  Physician Assistants and Nurse Practitioners) who all work together to provide you with the care you need, when you need it.  We recommend signing up for the patient portal called "MyChart".  Sign up information is provided on this After Visit Summary.  MyChart is used to connect with patients for Virtual Visits (Telemedicine).  Patients are able to view lab/test results, encounter notes, upcoming appointments, etc.  Non-urgent messages can be sent to your provider as well.   To learn more about what you can do with MyChart, go to [email or web address].    Your next appointment:   3 month(s)  The format for your next appointment:   In Person  Provider:   You may see Kirk Ruths, MD or one of the following Advanced Practice Providers on your designated Care Team:    [place], PA-C  Coletta Memos, FNP

## 2020-06-01 NOTE — Assessment & Plan Note (Addendum)
Increase Lasix to 40 mg BID x 2 days, then 40 mg daily Last EF was 50-55%- repeat echo has been ordered

## 2020-06-01 NOTE — Progress Notes (Signed)
Cardiology Office Note:    Date:  06/01/2020   ID:  Billy [name], DOB 12-17-1965, MRN [id number]  PCP:  Nicolette Bang, DO  Cardiologist:  Kirk Ruths, MD  Electrophysiologist:  Virl Axe, MD   Referring MD: Caryl Never*   CC: "weak"  History of Present Illness:    Billy [name] is a pleasant 55 y.o. male with a hx of V- fib cardiac arrest in 2010 s/p St Jude AICD, chronic systolic CHF, ASCAD s/p PCI at age 66 at 16 of the [place] and then CABG in 2003.  In 12/2007 he developed VT during a treadmill stress test so underwent cardiac cath showing severe 3-vessel CAD with 2/4 grafts patent with mildly reduced EF. He's had subsequent caths/PCIs.  His last cath was 05/18/2020 and the recommendation was for medical Rx secondary to extensive collateralization.   In addition to CAD he has PVD and is followed by Dr Fletcher Anon.  His last PTA was 05/14/2019 with a stent placed to the [place].    He's had problems with recurrent VT.  Dr Caryl Comes wants him to be seen by Dr Lovena Le for possible EP study.  The patient tells me he was unable to tolerate increase Amiodarone secondary to "dizziness".   Since his last office visit he tells me he has had increasing fatigue, edema, and DOE.  He is only on Lasix 20 mg daily secondary to history of hypotension.   Past Medical History:  Diagnosis Date  . Aortic atherosclerosis ([place])    on CXR  . Blood in stool   . C. difficile colitis    a. remote hx 2010.  . Cardiac arrest - ventricular fibrillation 02/11/2009   a. 02/2009 s/p St Jude ICD.  [name] Chronic systolic CHF (congestive heart failure) ([place])   . Coronary artery disease    a. PCI to Cx age 54. b. CABGx4 in 2003. c. BMS to [place] in 12/2007.  [name] Former tobacco use   . Hyperlipidemia   . Hypertension   . Ischemic cardiomyopathy   . Marijuana abuse   . Myocardial infarct ([name])    NON-ST-SEGMENT ELEVATION MI  . Obesity   . PAD (peripheral artery disease) ([place])    a. RLE by noninvasive  testing - managed medically for now.  . Ventricular tachyarrhythmia ([place])   . Ventricular tachycardia [facility])     Past Surgical History:  Procedure Laterality Date  . ABDOMINAL AORTOGRAM W/LOWER EXTREMITY N/A 05/14/2019   Procedure: ABDOMINAL AORTOGRAM W/LOWER EXTREMITY;  Surgeon: Wellington Hampshire, MD;  Location: [facility];  Service: Cardiovascular;  Laterality: N/A;  . CARDIAC DEFIBRILLATOR PLACEMENT     STJ single chamber ICD implanted for secondary prevention  . CIRCUMCISION    . CORONARY ARTERY BYPASS GRAFT  06/17/01   X 4  . CORONARY BALLOON ANGIOPLASTY N/A 01/22/2019   Procedure: CORONARY BALLOON ANGIOPLASTY;  Surgeon: Leonie Man, MD;  Location: [facility];  Service: Cardiovascular;  Laterality: N/A;  . LEFT HEART CATH AND CORS/GRAFTS ANGIOGRAPHY N/A 01/22/2019   Procedure: LEFT HEART CATH AND CORS/GRAFTS ANGIOGRAPHY;  Surgeon: Leonie Man, MD;  Location: [facility];  Service: Cardiovascular;  Laterality: N/A;  . LEFT HEART CATH AND CORS/GRAFTS ANGIOGRAPHY N/A 05/18/2020   Procedure: LEFT HEART CATH AND CORS/GRAFTS ANGIOGRAPHY;  Surgeon: Troy Sine, MD;  Location: [facility];  Service: Cardiovascular;  Laterality: N/A;  . PERIPHERAL VASCULAR INTERVENTION Right 05/14/2019   Procedure: PERIPHERAL VASCULAR INTERVENTION;  Surgeon:  Wellington Hampshire, MD;  Location: [facility];  Service: Cardiovascular;  Laterality: Right;    Current Medications: Current Meds  Medication Sig  . acetaminophen (TYLENOL) 500 MG tablet Take 1,000 mg by mouth every 6 (six) hours as needed for moderate pain or headache.  [name] amiodarone (PACERONE) 400 MG tablet Take 400 mg by mouth as directed. Take 1/2 tablet by mouth daily  . atorvastatin (LIPITOR) 80 MG tablet TAKE 1 TABLET BY MOUTH EVERY DAY  . benzonatate (TESSALON) 100 MG capsule Take 1 capsule (100 mg total) by mouth 2 (two) times daily as needed for cough.  . carvedilol (COREG) 12.5 MG tablet Take 1  tablet (12.5 mg total) by mouth 2 (two) times daily with a meal.  . clopidogrel (PLAVIX) 75 MG tablet Take 1 tablet (75 mg total) by mouth daily.  [name] ELIQUIS 5 MG TABS tablet TAKE 1 TABLET BY MOUTH TWICE A DAY  . ezetimibe (ZETIA) 10 MG tablet TAKE 1 TABLET BY MOUTH EVERY DAY  . fluticasone (FLONASE) 50 MCG/ACT nasal spray Place 2 sprays into both nostrils daily as needed for allergies or rhinitis.  [name] isosorbide mononitrate (IMDUR) 30 MG 24 hr tablet Take 1 tablet (30 mg total) by mouth daily.  [name] lisinopril (ZESTRIL) 2.5 MG tablet Take 1 tablet (2.5 mg total) by mouth daily.  . montelukast (SINGULAIR) 10 MG tablet Take 1 tablet (10 mg total) by mouth daily as needed (allergies).  . Multiple Vitamin (MULTIVITAMIN WITH MINERALS) TABS tablet Take 1 tablet by mouth daily.  . nitroGLYCERIN (NITROSTAT) 0.4 MG SL tablet PLACE 1 TABLET (0.4 MG TOTAL) UNDER THE TONGUE EVERY 5 (FIVE) MINUTES AS NEEDED FOR CHEST PAIN.  [name] pantoprazole (PROTONIX) 40 MG tablet Take 1 tablet (40 mg total) by mouth daily.  . potassium chloride SA (KLOR-CON M20) 20 MEQ tablet Take 1 tablet (20 mEq total) by mouth daily.  . [DISCONTINUED] furosemide (LASIX) 20 MG tablet TAKE 1 TABLET BY MOUTH EVERY DAY     Allergies:   Patient has no known allergies.   Social History   Socioeconomic History  . Marital status: Married    Spouse name: Not on file  . Number of children: 3  . Years of education: 13  . Highest education level: Not on file  Occupational History  . Occupation: Disability  Tobacco Use  . Smoking status: Former Smoker    Packs/day: 2.00    Years: 20.00    Pack years: 40.00    Quit date: 04/04/2003    Years since quitting: 17.1  . Smokeless tobacco: Never Used  Vaping Use  . Vaping Use: Never used  Substance and Sexual Activity  . Alcohol use: No  . Drug use: No  . Sexual activity: Yes    Partners: Female  Other Topics Concern  . Not on file  Social History Narrative   MARRIED   FORMER TOBACCO USE,  SMOKED FOR 20 YRS 2 PPD; QUIT IN 2005   NO ETOH   NO ILLICIT DRUG USE   NO REGULAR EXERCISE         ICD-ST. JUDE ; CERTIFIED LETTER SENT AND RETURNED BAD ADDRESS, PLS UPDATE DJW.      Fun: Physiological scientist Strain: Not on file  Food Insecurity: Not on file  Transportation Needs: Not on file  Physical Activity: Not on file  Stress: Not on file  Social Connections: Not on file  Family History: The patient's family history includes Heart attack in his father; Heart disease in his sister; Hypertension in his mother and another family member.  ROS:   Please see the history of present illness.     All other systems reviewed and are negative.  EKGs/Labs/Other Studies Reviewed:    The following studies were reviewed today: Cath 05/18/2020-  EKG:  EKG is not ordered today.  The ekg ordered 05/25/2020 demonstrates NSR-HR 60, QTc 420  Recent Labs: 10/15/2019: ALT 28; TSH 2.130 05/10/2020: Hemoglobin 16.5; Platelets 269 05/12/2020: Magnesium 2.0; NT-Pro BNP 162 05/18/2020: BUN 12; Creatinine, Ser 1.11; Potassium 4.0; Sodium 137  Recent Lipid Panel    Component Value Date/Time   CHOL 119 10/09/2019 0959   TRIG 59 10/09/2019 0959   HDL 32 (L) 10/09/2019 0959   CHOLHDL 3.7 10/09/2019 0959   CHOLHDL 3.9 01/23/2019 0515   VLDL 14 01/23/2019 0515   LDLCALC 74 10/09/2019 0959    Physical Exam:    VS:  BP 136/82   Pulse 66   Ht 5\' 11"  (1.803 m)   Wt 266 lb (120.7 kg)   SpO2 98%   BMI 37.10 kg/m     Wt Readings from Last 3 Encounters:  06/01/20 266 lb (120.7 kg)  05/25/20 259 lb (117.5 kg)  05/25/20 259 lb 12.8 oz (117.8 kg)     GEN: Well nourished, well developed in no acute distress HEENT: Normal NECK: JVD noted No carotid bruits LYMPHATICS: No lymphadenopathy CARDIAC: RRR, no murmurs, rubs, gallops RESPIRATORY:  Clear to auscultation without rales, wheezing or rhonchi  ABDOMEN: Soft, non-tender,  non-distended MUSCULOSKELETAL:  1+ edema; No deformity  SKIN: Warm and dry NEUROLOGIC:  Alert and oriented x 3 PSYCHIATRIC:  Normal affect   ASSESSMENT:    Acute on chronic heart failure ([name]) Increase Lasix to 40 mg BID x 2 days, then 40 mg daily Last EF was 50-55%- repeat echo has been ordered  Ventricular tachycardia ([name]) Recurrent VT- Dr Lovena Le to see- no recent ICD discharges  Hx of CABG H/O remote CABG and subsequent PCIs.  Cath done 05/18/2020 shows extensive collateralization- medical Rx  PLAN:    Check TSH (fatigue).  Increase lasix to 40 mg BID x 2 days then 40 mg daily. Keep f/u apt for echo and Dr Lovena Le.   Medication Adjustments/Labs and Tests Ordered: Current medicines are reviewed at length with the patient today.  Concerns regarding medicines are outlined above.  No orders of the defined types were placed in this encounter.  Meds ordered this encounter  Medications  . furosemide (LASIX) 40 MG tablet    Sig: Take 1 tablet (40 mg total) by mouth daily.    Dispense:  90 tablet    Refill:  3    Patient Instructions  Medication Instructions:  INCREASE- Furosemide 40 mg take 40 mg by mouth twice a day for 2 days then 40 mg daily  *If you need a refill on your cardiac medications before your next appointment, please call your pharmacy*   Lab Work: None Ordered  Testing/Procedures: None Ordered   Follow-Up: At [facility], you and your health needs are our priority.  As part of our continuing mission to provide you with exceptional heart care, we have created designated Provider Care Teams.  These Care Teams include your primary Cardiologist (physician) and Advanced Practice Providers (APPs -  Physician Assistants and Nurse Practitioners) who all work together to provide you with the care you need, when you need it.  We recommend signing up for the patient portal called "MyChart".  Sign up information is provided on this After Visit Summary.  MyChart is  used to connect with patients for Virtual Visits (Telemedicine).  Patients are able to view lab/test results, encounter notes, upcoming appointments, etc.  Non-urgent messages can be sent to your provider as well.   To learn more about what you can do with MyChart, go to [email or web address].    Your next appointment:   3 month(s)  The format for your next appointment:   In Person  Provider:   You may see Kirk Ruths, MD or one of the following Advanced Practice Providers on your designated Care Team:    Sande Rives, PA-C  Coletta Memos, FNP        Signed, Kerin Ransom, [place]  06/01/2020 4:19 PM    [place]

## 2020-06-03 ENCOUNTER — Other Ambulatory Visit: Payer: Self-pay

## 2020-06-03 ENCOUNTER — Ambulatory Visit (HOSPITAL_COMMUNITY): Payer: Medicare HMO | Attending: Internal Medicine

## 2020-06-03 DIAGNOSIS — I472 Ventricular tachycardia, unspecified: Secondary | ICD-10-CM

## 2020-06-03 DIAGNOSIS — I2510 Atherosclerotic heart disease of native coronary artery without angina pectoris: Secondary | ICD-10-CM | POA: Insufficient documentation

## 2020-06-03 DIAGNOSIS — Z95810 Presence of automatic (implantable) cardiac defibrillator: Secondary | ICD-10-CM | POA: Insufficient documentation

## 2020-06-03 LAB — ECHOCARDIOGRAM COMPLETE
Area-P 1/2: 2.28 cm2
S' Lateral: 4.2 cm

## 2020-06-03 MED ORDER — PERFLUTREN LIPID MICROSPHERE
1.0000 mL | INTRAVENOUS | Status: AC | PRN
  Administered 2020-06-03: 3 mL via INTRAVENOUS

## 2020-06-04 NOTE — Progress Notes (Signed)
Remote ICD transmission.   

## 2020-06-07 ENCOUNTER — Encounter: Payer: Medicare HMO | Admitting: Physician Assistant

## 2020-06-09 ENCOUNTER — Ambulatory Visit (HOSPITAL_COMMUNITY)
Admission: RE | Admit: 2020-06-09 | Discharge: 2020-06-09 | Disposition: A | Discharge status: Home / Self Care | Payer: Medicare HMO | Source: Ambulatory Visit | Attending: Cardiology | Admitting: Cardiology

## 2020-06-09 ENCOUNTER — Telehealth: Payer: Self-pay | Admitting: *Deleted

## 2020-06-09 ENCOUNTER — Other Ambulatory Visit: Payer: Self-pay | Admitting: Cardiovascular Disease

## 2020-06-09 ENCOUNTER — Other Ambulatory Visit: Payer: Self-pay

## 2020-06-09 ENCOUNTER — Ambulatory Visit (HOSPITAL_BASED_OUTPATIENT_CLINIC_OR_DEPARTMENT_OTHER)
Admission: RE | Admit: 2020-06-09 | Discharge: 2020-06-09 | Disposition: A | Discharge status: Home / Self Care | Payer: Medicare HMO | Source: Ambulatory Visit | Attending: Cardiology | Admitting: Cardiology

## 2020-06-09 DIAGNOSIS — I739 Peripheral vascular disease, unspecified: Secondary | ICD-10-CM | POA: Insufficient documentation

## 2020-06-09 DIAGNOSIS — Z79899 Other long term (current) drug therapy: Secondary | ICD-10-CM

## 2020-06-09 DIAGNOSIS — Z95828 Presence of other vascular implants and grafts: Secondary | ICD-10-CM

## 2020-06-09 MED ORDER — ENTRESTO 24-26 MG PO TABS: 1.0000 | ORAL_TABLET | Freq: Two times a day (BID) | ORAL | 6 refills | Status: DC

## 2020-06-09 NOTE — Telephone Encounter (Signed)
-----   Message from Erlene Quan, [place] sent at 06/08/2020  8:48 AM EST ----- I called the patient to discuss his echo results.  I think he would be a good candidate for Entresto low-dose.  He has had issues with low blood pressure in the past but has blood pressure at his last office visit was in the 537 systolic.  We will stop his lisinopril for 2 days and then have him start low-dose Entresto.  He will need an office visit with a BMP in 2 weeks.  Kerin Ransom PA-C 06/08/2020 8:48 AM

## 2020-06-09 NOTE — Telephone Encounter (Signed)
Spoke with pt wife(DPR), to let her know coupon for Delene Loll will be placed at front desk for pick, also advised her that pt will need blood work done on Monday and lab slip will be at front desk also

## 2020-06-14 ENCOUNTER — Encounter: Payer: Self-pay | Admitting: Internal Medicine

## 2020-06-14 ENCOUNTER — Ambulatory Visit (INDEPENDENT_AMBULATORY_CARE_PROVIDER_SITE_OTHER): Payer: Medicare HMO | Admitting: Internal Medicine

## 2020-06-14 ENCOUNTER — Other Ambulatory Visit: Payer: Self-pay

## 2020-06-14 VITALS — BP 122/78 | HR 79 | Ht 71.0 in | Wt 263.6 lb

## 2020-06-14 DIAGNOSIS — Z95810 Presence of automatic (implantable) cardiac defibrillator: Secondary | ICD-10-CM | POA: Diagnosis not present

## 2020-06-14 DIAGNOSIS — I472 Ventricular tachycardia, unspecified: Secondary | ICD-10-CM

## 2020-06-14 DIAGNOSIS — I255 Ischemic cardiomyopathy: Secondary | ICD-10-CM

## 2020-06-14 LAB — CBC WITH DIFFERENTIAL/PLATELET
Basophils Absolute: 0 10*3/uL (ref 0.0–0.2)
Basos: 0 %
EOS (ABSOLUTE): 0.3 10*3/uL (ref 0.0–0.4)
Eos: 3 %
Hematocrit: 49 % (ref 37.5–51.0)
Hemoglobin: 16.8 g/dL (ref 13.0–17.7)
Lymphocytes Absolute: 2.9 10*3/uL (ref 0.7–3.1)
Lymphs: 30 %
MCH: 28.4 pg (ref 26.6–33.0)
MCHC: 34.3 g/dL (ref 31.5–35.7)
MCV: 83 fL (ref 79–97)
Monocytes Absolute: 1 10*3/uL — ABNORMAL HIGH (ref 0.1–0.9)
Monocytes: 10 %
Neutrophils Absolute: 5.6 10*3/uL (ref 1.4–7.0)
Neutrophils: 57 %
Platelets: 245 10*3/uL (ref 150–450)
RBC: 5.91 x10E6/uL — ABNORMAL HIGH (ref 4.14–5.80)
RDW: 16.9 % — ABNORMAL HIGH (ref 11.6–15.4)
WBC: 9.8 10*3/uL (ref 3.4–10.8)

## 2020-06-14 LAB — BASIC METABOLIC PANEL
BUN/Creatinine Ratio: 11 (ref 9–20)
BUN: 14 mg/dL (ref 6–24)
CO2: 26 mmol/L (ref 20–29)
Calcium: 9.4 mg/dL (ref 8.7–10.2)
Chloride: 104 mmol/L (ref 96–106)
Creatinine, Ser: 1.27 mg/dL (ref 0.76–1.27)
Glucose: 105 mg/dL — ABNORMAL HIGH (ref 65–99)
Potassium: 4.5 mmol/L (ref 3.5–5.2)
Sodium: 139 mmol/L (ref 134–144)
eGFR: 67 mL/min/{1.73_m2} (ref >59)

## 2020-06-14 NOTE — Progress Notes (Signed)
HPI Mr. Baranek returns today for followup. He is a pleasant 55 yo man with CAD, s/p CABG remotely and is s/p PCI recently. He has a h/o palpitations dating back to young adulthood. He has a h/o VF arrest. He also has a h/o atrial fib. Several months ago, he had several VF episodes which were actually preceeded by what appears to be SVT, termination, a pause and then the initiation of a much faster tachycardia, likely PMVT, resulting in ICD shock. He is referred today to discuss possible treatment of catheter ablation. When he was younger and would feel his heart race, he never sought medical attention.   No Known Allergies   Current Outpatient Medications  Medication Sig Dispense Refill  . acetaminophen (TYLENOL) 500 MG tablet Take 1,000 mg by mouth every 6 (six) hours as needed for moderate pain or headache.    [name] amiodarone (PACERONE) 400 MG tablet Take 400 mg by mouth as directed. Take 1/2 tablet by mouth daily    . amoxicillin-clavulanate (AUGMENTIN) 875-125 MG tablet Take 1 tablet by mouth every 12 (twelve) hours. 14 tablet 0  . atorvastatin (LIPITOR) 80 MG tablet TAKE 1 TABLET BY MOUTH EVERY DAY 90 tablet 3  . benzonatate (TESSALON) 100 MG capsule Take 1 capsule (100 mg total) by mouth 2 (two) times daily as needed for cough. 20 capsule 0  . carvedilol (COREG) 12.5 MG tablet Take 1 tablet (12.5 mg total) by mouth 2 (two) times daily with a meal. 180 tablet 3  . cetirizine (ZYRTEC ALLERGY) 10 MG tablet Take 1 tablet (10 mg total) by mouth daily. 30 tablet 0  . clopidogrel (PLAVIX) 75 MG tablet Take 1 tablet (75 mg total) by mouth daily. 90 tablet 3  . ELIQUIS 5 MG TABS tablet TAKE 1 TABLET BY MOUTH TWICE A DAY 180 tablet 3  . ezetimibe (ZETIA) 10 MG tablet TAKE 1 TABLET BY MOUTH EVERY DAY 90 tablet 3  . fluticasone (FLONASE) 50 MCG/ACT nasal spray Place 2 sprays into both nostrils daily as needed for allergies or rhinitis. 16 g 11  . furosemide (LASIX) 40 MG tablet Take 1 tablet (40  mg total) by mouth daily. 90 tablet 3  . isosorbide mononitrate (IMDUR) 30 MG 24 hr tablet Take 1 tablet (30 mg total) by mouth daily. 90 tablet 3  . montelukast (SINGULAIR) 10 MG tablet Take 1 tablet (10 mg total) by mouth daily as needed (allergies). 90 tablet 1  . Multiple Vitamin (MULTIVITAMIN WITH MINERALS) TABS tablet Take 1 tablet by mouth daily.    . nitroGLYCERIN (NITROSTAT) 0.4 MG SL tablet PLACE 1 TABLET (0.4 MG TOTAL) UNDER THE TONGUE EVERY 5 (FIVE) MINUTES AS NEEDED FOR CHEST PAIN. 75 tablet 2  . pantoprazole (PROTONIX) 40 MG tablet Take 1 tablet (40 mg total) by mouth daily. 90 tablet 1  . potassium chloride SA (KLOR-CON M20) 20 MEQ tablet Take 1 tablet (20 mEq total) by mouth daily. 90 tablet 3  . sacubitril-valsartan (ENTRESTO) 24-26 MG Take 1 tablet by mouth 2 (two) times daily. 60 tablet 6   No current facility-administered medications for this visit.     Past Medical History:  Diagnosis Date  . Aortic atherosclerosis ([place])    on CXR  . Blood in stool   . C. difficile colitis    a. remote hx 2010.  . Cardiac arrest - ventricular fibrillation 02/11/2009   a. 02/2009 s/p St Jude ICD.  [name] Chronic systolic CHF (congestive heart failure) (  [place])   . Coronary artery disease    a. PCI to Cx age 65. b. CABGx4 in 2003. c. BMS to [place] in 12/2007.  [name] Former tobacco use   . Hyperlipidemia   . Hypertension   . Ischemic cardiomyopathy   . Marijuana abuse   . Myocardial infarct ([name])    NON-ST-SEGMENT ELEVATION MI  . Obesity   . PAD (peripheral artery disease) ([place])    a. RLE by noninvasive testing - managed medically for now.  . Ventricular tachyarrhythmia ([place])   . Ventricular tachycardia ([place])     ROS:   All systems reviewed and negative except as noted in the HPI.   Past Surgical History:  Procedure Laterality Date  . ABDOMINAL AORTOGRAM W/LOWER EXTREMITY N/A 05/14/2019   Procedure: ABDOMINAL AORTOGRAM W/LOWER EXTREMITY;  Surgeon: Wellington Hampshire, MD;  Location: [facility];  Service: Cardiovascular;  Laterality: N/A;  . CARDIAC DEFIBRILLATOR PLACEMENT     STJ single chamber ICD implanted for secondary prevention  . CIRCUMCISION    . CORONARY ARTERY BYPASS GRAFT  06/17/01   X 4  . CORONARY BALLOON ANGIOPLASTY N/A 01/22/2019   Procedure: CORONARY BALLOON ANGIOPLASTY;  Surgeon: Leonie Man, MD;  Location: [facility];  Service: Cardiovascular;  Laterality: N/A;  . LEFT HEART CATH AND CORS/GRAFTS ANGIOGRAPHY N/A 01/22/2019   Procedure: LEFT HEART CATH AND CORS/GRAFTS ANGIOGRAPHY;  Surgeon: Leonie Man, MD;  Location: [facility];  Service: Cardiovascular;  Laterality: N/A;  . LEFT HEART CATH AND CORS/GRAFTS ANGIOGRAPHY N/A 05/18/2020   Procedure: LEFT HEART CATH AND CORS/GRAFTS ANGIOGRAPHY;  Surgeon: Troy Sine, MD;  Location: [facility];  Service: Cardiovascular;  Laterality: N/A;  . PERIPHERAL VASCULAR INTERVENTION Right 05/14/2019   Procedure: PERIPHERAL VASCULAR INTERVENTION;  Surgeon: Wellington Hampshire, MD;  Location: [facility];  Service: Cardiovascular;  Laterality: Right;     Family History  Problem Relation Age of Onset  . Hypertension Mother   . Heart attack Father        DIED AT 25 FROM MI  . Heart disease Sister        CAD AND PREVIOUS CABG  . Hypertension Other        IN MOST OF HIS SIBLINGS     Social History   Socioeconomic History  . Marital status: Married    Spouse name: Not on file  . Number of children: 3  . Years of education: 47  . Highest education level: Not on file  Occupational History  . Occupation: Disability  Tobacco Use  . Smoking status: Former Smoker    Packs/day: 2.00    Years: 20.00    Pack years: 40.00    Quit date: 04/04/2003    Years since quitting: 17.2  . Smokeless tobacco: Never Used  Vaping Use  . Vaping Use: Never used  Substance and Sexual Activity  . Alcohol use: No  . Drug use: No  . Sexual activity: Yes    Partners: Female  Other  Topics Concern  . Not on file  Social History Narrative   MARRIED   FORMER TOBACCO USE, SMOKED FOR 20 YRS 2 PPD; QUIT IN 2005   NO ETOH   NO ILLICIT DRUG USE   NO REGULAR EXERCISE         ICD-ST. JUDE ; CERTIFIED LETTER SENT AND RETURNED BAD ADDRESS, PLS UPDATE DJW.      Fun: Fishing    Social Determinants  of Health   Financial Resource Strain: Not on file  Food Insecurity: Not on file  Transportation Needs: Not on file  Physical Activity: Not on file  Stress: Not on file  Social Connections: Not on file  Intimate Partner Violence: Not on file     BP 122/78   Pulse 79   Ht 5\' 11"  (1.803 m)   Wt 263 lb 9.6 oz (119.6 kg)   SpO2 98%   BMI 36.76 kg/m   Physical Exam:  Well appearing NAD HEENT: Unremarkable Neck:  No JVD, no thyromegally Lymphatics:  No adenopathy Back:  No CVA tenderness Lungs:  Clear with no wheezes HEART:  Regular rate rhythm, no murmurs, no rubs, no clicks Abd:  soft, positive bowel sounds, no organomegally, no rebound, no guarding Ext:  2 plus pulses, no edema, no cyanosis, no clubbing Skin:  No rashes no nodules Neuro:  CN II through XII intact, motor grossly intact   DEVICE  Normal device function.  See PaceArt for details.   Assess/Plan: 1. Probable SVT - we have discussed EP study and catheter ablation with the patient and his wife. The indications/risks/benefits/goals/expectations of the procedure and he wishes to proceed. 2. PMVT - the initiation has been pause dependent after what appears to be a bout of SVT. He is on high dose amiodarone.  3. ICD - his device is working normally. Today we reduced the rate of his monitor zone.   Carleene Overlie Taylor,MD

## 2020-06-14 NOTE — H&P (View-Only) (Signed)
HPI Billy [name] returns today for followup. He is a pleasant 55 yo man with CAD, s/p CABG remotely and is s/p PCI recently. He has a h/o palpitations dating back to young adulthood. He has a h/o VF arrest. He also has a h/o atrial fib. Several months ago, he had several VF episodes which were actually preceeded by what appears to be SVT, termination, a pause and then the initiation of a much faster tachycardia, likely PMVT, resulting in ICD shock. He is referred today to discuss possible treatment of catheter ablation. When he was younger and would feel his heart race, he never sought medical attention.   No Known Allergies   Current Outpatient Medications  Medication Sig Dispense Refill  . acetaminophen (TYLENOL) 500 MG tablet Take 1,000 mg by mouth every 6 (six) hours as needed for moderate pain or headache.    [name] amiodarone (PACERONE) 400 MG tablet Take 400 mg by mouth as directed. Take 1/2 tablet by mouth daily    . amoxicillin-clavulanate (AUGMENTIN) 875-125 MG tablet Take 1 tablet by mouth every 12 (twelve) hours. 14 tablet 0  . atorvastatin (LIPITOR) 80 MG tablet TAKE 1 TABLET BY MOUTH EVERY DAY 90 tablet 3  . benzonatate (TESSALON) 100 MG capsule Take 1 capsule (100 mg total) by mouth 2 (two) times daily as needed for cough. 20 capsule 0  . carvedilol (COREG) 12.5 MG tablet Take 1 tablet (12.5 mg total) by mouth 2 (two) times daily with a meal. 180 tablet 3  . cetirizine (ZYRTEC ALLERGY) 10 MG tablet Take 1 tablet (10 mg total) by mouth daily. 30 tablet 0  . clopidogrel (PLAVIX) 75 MG tablet Take 1 tablet (75 mg total) by mouth daily. 90 tablet 3  . ELIQUIS 5 MG TABS tablet TAKE 1 TABLET BY MOUTH TWICE A DAY 180 tablet 3  . ezetimibe (ZETIA) 10 MG tablet TAKE 1 TABLET BY MOUTH EVERY DAY 90 tablet 3  . fluticasone (FLONASE) 50 MCG/ACT nasal spray Place 2 sprays into both nostrils daily as needed for allergies or rhinitis. 16 g 11  . furosemide (LASIX) 40 MG tablet Take 1 tablet (40  mg total) by mouth daily. 90 tablet 3  . isosorbide mononitrate (IMDUR) 30 MG 24 hr tablet Take 1 tablet (30 mg total) by mouth daily. 90 tablet 3  . montelukast (SINGULAIR) 10 MG tablet Take 1 tablet (10 mg total) by mouth daily as needed (allergies). 90 tablet 1  . Multiple Vitamin (MULTIVITAMIN WITH MINERALS) TABS tablet Take 1 tablet by mouth daily.    . nitroGLYCERIN (NITROSTAT) 0.4 MG SL tablet PLACE 1 TABLET (0.4 MG TOTAL) UNDER THE TONGUE EVERY 5 (FIVE) MINUTES AS NEEDED FOR CHEST PAIN. 75 tablet 2  . pantoprazole (PROTONIX) 40 MG tablet Take 1 tablet (40 mg total) by mouth daily. 90 tablet 1  . potassium chloride SA (KLOR-CON M20) 20 MEQ tablet Take 1 tablet (20 mEq total) by mouth daily. 90 tablet 3  . sacubitril-valsartan (ENTRESTO) 24-26 MG Take 1 tablet by mouth 2 (two) times daily. 60 tablet 6   No current facility-administered medications for this visit.     Past Medical History:  Diagnosis Date  . Aortic atherosclerosis ([place])    on CXR  . Blood in stool   . C. difficile colitis    a. remote hx 2010.  . Cardiac arrest - ventricular fibrillation 02/11/2009   a. 02/2009 s/p St Jude ICD.  [name] Chronic systolic CHF (congestive heart failure) (  [place])   . Coronary artery disease    a. PCI to Cx age 88. b. CABGx4 in 2003. c. BMS to [facility] in 12/2007.  [name] Former tobacco use   . Hyperlipidemia   . Hypertension   . Ischemic cardiomyopathy   . Marijuana abuse   . Myocardial infarct ([name])    NON-ST-SEGMENT ELEVATION MI  . Obesity   . PAD (peripheral artery disease) ([place])    a. RLE by noninvasive testing - managed medically for now.  . Ventricular tachyarrhythmia ([place])   . Ventricular tachycardia ([place])     ROS:   All systems reviewed and negative except as noted in the HPI.   Past Surgical History:  Procedure Laterality Date  . ABDOMINAL AORTOGRAM W/LOWER EXTREMITY N/A 05/14/2019   Procedure: ABDOMINAL AORTOGRAM W/LOWER EXTREMITY;  Surgeon: Wellington Hampshire, MD;  Location: [facility];  Service: Cardiovascular;  Laterality: N/A;  . CARDIAC DEFIBRILLATOR PLACEMENT     STJ single chamber ICD implanted for secondary prevention  . CIRCUMCISION    . CORONARY ARTERY BYPASS GRAFT  06/17/01   X 4  . CORONARY BALLOON ANGIOPLASTY N/A 01/22/2019   Procedure: CORONARY BALLOON ANGIOPLASTY;  Surgeon: Leonie Man, MD;  Location: [facility];  Service: Cardiovascular;  Laterality: N/A;  . LEFT HEART CATH AND CORS/GRAFTS ANGIOGRAPHY N/A 01/22/2019   Procedure: LEFT HEART CATH AND CORS/GRAFTS ANGIOGRAPHY;  Surgeon: Leonie Man, MD;  Location: [facility];  Service: Cardiovascular;  Laterality: N/A;  . LEFT HEART CATH AND CORS/GRAFTS ANGIOGRAPHY N/A 05/18/2020   Procedure: LEFT HEART CATH AND CORS/GRAFTS ANGIOGRAPHY;  Surgeon: Troy Sine, MD;  Location: [facility];  Service: Cardiovascular;  Laterality: N/A;  . PERIPHERAL VASCULAR INTERVENTION Right 05/14/2019   Procedure: PERIPHERAL VASCULAR INTERVENTION;  Surgeon: Wellington Hampshire, MD;  Location: [facility];  Service: Cardiovascular;  Laterality: Right;     Family History  Problem Relation Age of Onset  . Hypertension Mother   . Heart attack Father        DIED AT 40 FROM MI  . Heart disease Sister        CAD AND PREVIOUS CABG  . Hypertension Other        IN MOST OF HIS SIBLINGS     Social History   Socioeconomic History  . Marital status: Married    Spouse name: Not on file  . Number of children: 3  . Years of education: 45  . Highest education level: Not on file  Occupational History  . Occupation: Disability  Tobacco Use  . Smoking status: Former Smoker    Packs/day: 2.00    Years: 20.00    Pack years: 40.00    Quit date: 04/04/2003    Years since quitting: 17.2  . Smokeless tobacco: Never Used  Vaping Use  . Vaping Use: Never used  Substance and Sexual Activity  . Alcohol use: No  . Drug use: No  . Sexual activity: Yes    Partners: Female  Other  Topics Concern  . Not on file  Social History Narrative   MARRIED   FORMER TOBACCO USE, SMOKED FOR 20 YRS 2 PPD; QUIT IN 2005   NO ETOH   NO ILLICIT DRUG USE   NO REGULAR EXERCISE         ICD-ST. JUDE ; CERTIFIED LETTER SENT AND RETURNED BAD ADDRESS, PLS UPDATE DJW.      Fun: Fishing    Social Determinants  of Health   Financial Resource Strain: Not on file  Food Insecurity: Not on file  Transportation Needs: Not on file  Physical Activity: Not on file  Stress: Not on file  Social Connections: Not on file  Intimate Partner Violence: Not on file     BP 122/78   Pulse 79   Ht 5\' 11"  (1.803 m)   Wt 263 lb 9.6 oz (119.6 kg)   SpO2 98%   BMI 36.76 kg/m   Physical Exam:  Well appearing NAD HEENT: Unremarkable Neck:  No JVD, no thyromegally Lymphatics:  No adenopathy Back:  No CVA tenderness Lungs:  Clear with no wheezes HEART:  Regular rate rhythm, no murmurs, no rubs, no clicks Abd:  soft, positive bowel sounds, no organomegally, no rebound, no guarding Ext:  2 plus pulses, no edema, no cyanosis, no clubbing Skin:  No rashes no nodules Neuro:  CN II through XII intact, motor grossly intact   DEVICE  Normal device function.  See PaceArt for details.   Assess/Plan: 1. Probable SVT - we have discussed EP study and catheter ablation with the patient and his wife. The indications/risks/benefits/goals/expectations of the procedure and he wishes to proceed. 2. PMVT - the initiation has been pause dependent after what appears to be a bout of SVT. He is on high dose amiodarone.  3. ICD - his device is working normally. Today we reduced the rate of his monitor zone.   Carleene Overlie Aime Carreras,MD

## 2020-06-14 NOTE — Patient Instructions (Addendum)
Medication Instructions:  Your physician recommends that you continue on your current medications as directed. Please refer to the Current Medication list given to you today.  Labwork: You will get lab work today:  CBC and BMP  Testing/Procedures: None ordered.  Follow-Up:  SEE INSTRUCTION LETTER  Do NOT take amiodarone for 24 hours prior to procedure - written on instruction letter  Any Other Special Instructions Will Be Listed Below (If Applicable).  If you need a refill on your cardiac medications before your next appointment, please call your pharmacy.    Cardiac electrophysiology: From cell to bedside (7th ed., pp. 6720-9470). [place], [place]: Elsevier.">  Cardiac Ablation Cardiac ablation is a procedure to destroy, or ablate, a small amount of heart tissue in very specific places. The heart has many electrical connections. Sometimes these connections are abnormal and can cause the heart to beat very fast or irregularly. Ablating some of the areas that cause problems can improve the heart's rhythm or return it to normal. Ablation may be done for people who:  Have Wolff-Parkinson-White syndrome.  Have fast heart rhythms (tachycardia).  Have taken medicines for an abnormal heart rhythm (arrhythmia) that were not effective or caused side effects.  Have a high-risk heartbeat that may be life-threatening. During the procedure, a small incision is made in the neck or the groin, and a long, thin tube (catheter) is inserted into the incision and moved to the heart. Small devices (electrodes) on the tip of the catheter will send out electrical currents. A type of X-ray (fluoroscopy) will be used to help guide the catheter and to provide images of the heart. Tell a health care provider about:  Any allergies you have.  All medicines you are taking, including vitamins, herbs, eye drops, creams, and over-the-counter medicines.  Any problems you or family members have had with  anesthetic medicines.  Any blood disorders you have.  Any surgeries you have had.  Any medical conditions you have, such as kidney failure.  Whether you are pregnant or may be pregnant. What are the risks? Generally, this is a safe procedure. However, problems may occur, including:  Infection.  Bruising and bleeding at the catheter insertion site.  Bleeding into the chest, especially into the sac that surrounds the heart. This is a serious complication.  Stroke or blood clots.  Damage to nearby structures or organs.  Allergic reaction to medicines or dyes.  Need for a permanent pacemaker if the normal electrical system is damaged. A pacemaker is a small computer that sends electrical signals to the heart and helps your heart beat normally.  The procedure not being fully effective. This may not be recognized until months later. Repeat ablation procedures are sometimes done. What happens before the procedure? Medicines Ask your health care provider about:  Changing or stopping your regular medicines. This is especially important if you are taking diabetes medicines or blood thinners.  Taking medicines such as aspirin and ibuprofen. These medicines can thin your blood. Do not take these medicines unless your health care provider tells you to take them.  Taking over-the-counter medicines, vitamins, herbs, and supplements. General instructions  Follow instructions from your health care provider about eating or drinking restrictions.  Plan to have someone take you home from the hospital or clinic.  If you will be going home right after the procedure, plan to have someone with you for 24 hours.  Ask your health care provider what steps will be taken to prevent infection. What happens during the  procedure?  An IV will be inserted into one of your veins.  You will be given a medicine to help you relax (sedative).  The skin on your neck or groin will be numbed.  An incision  will be made in your neck or your groin.  A needle will be inserted through the incision and into a large vein in your neck or groin.  A catheter will be inserted into the needle and moved to your heart.  Dye may be injected through the catheter to help your surgeon see the area of the heart that needs treatment.  Electrical currents will be sent from the catheter to ablate heart tissue in desired areas. There are three types of energy that may be used to do this: ? Heat (radiofrequency energy). ? Laser energy. ? Extreme cold (cryoablation).  When the tissue has been ablated, the catheter will be removed.  Pressure will be held on the insertion area to prevent a lot of bleeding.  A bandage (dressing) will be placed over the insertion area. The exact procedure may vary among health care providers and hospitals.   What happens after the procedure?  Your blood pressure, heart rate, breathing rate, and blood oxygen level will be monitored until you leave the hospital or clinic.  Your insertion area will be monitored for bleeding. You will need to lie still for a few hours to ensure that you do not bleed from the insertion area.  Do not drive for 24 hours or as long as told by your health care provider. Summary  Cardiac ablation is a procedure to destroy, or ablate, a small amount of heart tissue using an electrical current. This procedure can improve the heart rhythm or return it to normal.  Tell your health care provider about any medical conditions you may have and all medicines you are taking to treat them.  This is a safe procedure, but problems may occur. Problems may include infection, bruising, damage to nearby organs or structures, or allergic reactions to medicines.  Follow your health care provider's instructions about eating and drinking before the procedure. You may also be told to change or stop some of your medicines.  After the procedure, do not drive for 24 hours or as  long as told by your health care provider. This information is not intended to replace advice given to you by your health care provider. Make sure you discuss any questions you have with your health care provider. Document Revised: 01/27/2019 Document Reviewed: 01/27/2019 Elsevier Patient Education  [place].

## 2020-06-21 ENCOUNTER — Other Ambulatory Visit: Payer: Self-pay

## 2020-06-21 ENCOUNTER — Ambulatory Visit (HOSPITAL_COMMUNITY)
Admission: RE | Disposition: A | Discharge status: Home / Self Care | Payer: Self-pay | Source: Home / Self Care | Attending: Internal Medicine

## 2020-06-21 ENCOUNTER — Ambulatory Visit (HOSPITAL_COMMUNITY)
Admission: RE | Admit: 2020-06-21 | Discharge: 2020-06-21 | Disposition: A | Discharge status: Home / Self Care | Payer: Medicare HMO | Attending: Internal Medicine | Admitting: Internal Medicine

## 2020-06-21 DIAGNOSIS — Z7902 Long term (current) use of antithrombotics/antiplatelets: Secondary | ICD-10-CM | POA: Insufficient documentation

## 2020-06-21 DIAGNOSIS — I471 Supraventricular tachycardia: Secondary | ICD-10-CM | POA: Insufficient documentation

## 2020-06-21 DIAGNOSIS — Z955 Presence of coronary angioplasty implant and graft: Secondary | ICD-10-CM | POA: Insufficient documentation

## 2020-06-21 DIAGNOSIS — Z87891 Personal history of nicotine dependence: Secondary | ICD-10-CM | POA: Diagnosis not present

## 2020-06-21 DIAGNOSIS — Z79899 Other long term (current) drug therapy: Secondary | ICD-10-CM | POA: Diagnosis not present

## 2020-06-21 DIAGNOSIS — Z8249 Family history of ischemic heart disease and other diseases of the circulatory system: Secondary | ICD-10-CM | POA: Diagnosis not present

## 2020-06-21 DIAGNOSIS — Z951 Presence of aortocoronary bypass graft: Secondary | ICD-10-CM | POA: Diagnosis not present

## 2020-06-21 DIAGNOSIS — I2510 Atherosclerotic heart disease of native coronary artery without angina pectoris: Secondary | ICD-10-CM | POA: Diagnosis not present

## 2020-06-21 DIAGNOSIS — Z95810 Presence of automatic (implantable) cardiac defibrillator: Secondary | ICD-10-CM | POA: Insufficient documentation

## 2020-06-21 DIAGNOSIS — Z7901 Long term (current) use of anticoagulants: Secondary | ICD-10-CM | POA: Diagnosis not present

## 2020-06-21 HISTORY — PX: SVT ABLATION: EP1225

## 2020-06-21 SURGERY — SVT ABLATION

## 2020-06-21 MED ORDER — HEPARIN (PORCINE) IN NACL 1000-0.9 UT/500ML-% IV SOLN
INTRAVENOUS | Status: DC | PRN
  Administered 2020-06-21: 500 mL

## 2020-06-21 MED ORDER — SODIUM CHLORIDE 0.9 % IV SOLN: INTRAVENOUS | Status: DC

## 2020-06-21 MED ORDER — LIDOCAINE HCL (PF) 1 % IJ SOLN
INTRAMUSCULAR | Status: DC | PRN
  Administered 2020-06-21: 20 mL
  Administered 2020-06-21: 40 mL

## 2020-06-21 MED ORDER — FENTANYL CITRATE (PF) 100 MCG/2ML IJ SOLN
INTRAMUSCULAR | Status: DC | PRN
  Administered 2020-06-21: 25 ug via INTRAVENOUS
  Administered 2020-06-21: 12.5 ug via INTRAVENOUS
  Administered 2020-06-21 (×2): 25 ug via INTRAVENOUS
  Administered 2020-06-21 (×6): 12.5 ug via INTRAVENOUS

## 2020-06-21 MED ORDER — MIDAZOLAM HCL 5 MG/5ML IJ SOLN
INTRAMUSCULAR | Status: AC
  Filled 2020-06-21: qty 5

## 2020-06-21 MED ORDER — FENTANYL CITRATE (PF) 100 MCG/2ML IJ SOLN
INTRAMUSCULAR | Status: AC
  Filled 2020-06-21: qty 2

## 2020-06-21 MED ORDER — LIDOCAINE HCL (PF) 1 % IJ SOLN
INTRAMUSCULAR | Status: AC
  Filled 2020-06-21: qty 30

## 2020-06-21 MED ORDER — HEPARIN (PORCINE) IN NACL 1000-0.9 UT/500ML-% IV SOLN
INTRAVENOUS | Status: AC
  Filled 2020-06-21: qty 1000

## 2020-06-21 MED ORDER — MIDAZOLAM HCL 5 MG/5ML IJ SOLN
INTRAMUSCULAR | Status: DC | PRN
  Administered 2020-06-21: 2 mg via INTRAVENOUS
  Administered 2020-06-21: 1 mg via INTRAVENOUS
  Administered 2020-06-21: 2 mg via INTRAVENOUS
  Administered 2020-06-21 (×8): 1 mg via INTRAVENOUS
  Administered 2020-06-21: 2 mg via INTRAVENOUS

## 2020-06-21 MED ORDER — LIDOCAINE HCL 1 % IJ SOLN
INTRAMUSCULAR | Status: AC
  Filled 2020-06-21: qty 40

## 2020-06-21 MED ORDER — ISOPROTERENOL HCL 0.2 MG/ML IJ SOLN
INTRAMUSCULAR | Status: AC
  Filled 2020-06-21: qty 5

## 2020-06-21 SURGICAL SUPPLY — 14 items
CATH EZ STEER NAV 4MM D-F CUR (ABLATOR) ×2 IMPLANT
CATH JOSEPH QUAD ALLRED 6F REP (CATHETERS) ×4 IMPLANT
CATH JSN HEX 2-5-2 120 (CATHETERS) ×2 IMPLANT
CATHETER LAUNCHER 6FR MP1 (CATHETERS) ×2 IMPLANT
MAT PREVALON FULL STRYKER (MISCELLANEOUS) ×2 IMPLANT
PACK EP LATEX FREE (CUSTOM PROCEDURE TRAY) ×2
PACK EP LF (CUSTOM PROCEDURE TRAY) ×1 IMPLANT
PAD PRO RADIOLUCENT 2001M-C (PAD) ×2 IMPLANT
PATCH CARTO3 (PAD) ×2 IMPLANT
SHEATH PINNACLE 6F 10CM (SHEATH) ×4 IMPLANT
SHEATH PINNACLE 7F 10CM (SHEATH) ×2 IMPLANT
SHEATH PINNACLE 8F 10CM (SHEATH) ×2 IMPLANT
SHEATH PROBE COVER 6X72 (BAG) ×2 IMPLANT
WIRE HI TORQ VERSACORE-J 145CM (WIRE) ×2 IMPLANT

## 2020-06-21 NOTE — Progress Notes (Signed)
SITE AREA: right groin/femoral  SITE PRIOR TO REMOVAL:  LEVEL 0  PRESSURE APPLIED FOR: for approximately 12 minutes  MANUAL: YES  PATIENT STATUS DURING PULL: wnl, resting  POST PULL SITE:  LEVEL 0  POST PULL INSTRUCTIONS GIVEN: yes  POST PULL PULSES PRESENT: bilateral pedal pulses at +1  DRESSING APPLIED: yes, gauze with tegaderm  BEDREST BEGINS @ 1112  COMMENTS:

## 2020-06-21 NOTE — Progress Notes (Signed)
Discharge instructions reviewed with patient and family verbalized. Report given to [facility], RN to assume care until discharge at 1730.

## 2020-06-21 NOTE — Progress Notes (Signed)
SITE AREA: right IJ  SITE PRIOR TO REMOVAL:  LEVEL 0  PRESSURE APPLIED FOR: for approximately 10 minutes  MANUAL: yes  PATIENT STATUS DURING PULL: wnl  POST PULL SITE:  LEVEL 0  POST PULL INSTRUCTIONS GIVEN: yes  DRESSING APPLIED: gauze with petrolatum dressing and tegaderm, petrolatum used during pulling of sheath

## 2020-06-21 NOTE — Progress Notes (Signed)
Up and walked and tolerated well; right groin stable, no bleeding or hematoma 

## 2020-06-21 NOTE — Interval H&P Note (Signed)
History and Physical Interval Note:  06/21/2020 8:42 PM  [name]  has presented today for surgery, with the diagnosis of svt.  The various methods of treatment have been discussed with the patient and family. After consideration of risks, benefits and other options for treatment, the patient has consented to  Procedure(s): SVT ABLATION (N/A) as a surgical intervention.  The patient's history has been reviewed, patient examined, no change in status, stable for surgery.  I have reviewed the patient's chart and labs.  Questions were answered to the patient's satisfaction.     Billy [name]

## 2020-06-21 NOTE — Discharge Instructions (Signed)
Femoral Site Care  This sheet gives you information about how to care for yourself after your procedure. Your health care provider may also give you more specific instructions. If you have problems or questions, contact your health care provider. What can I expect after the procedure? After the procedure, it is common to have:  Bruising that usually fades within 1-2 weeks.  Tenderness at the site. Follow these instructions at home: Wound care  Follow instructions from your health care provider about how to take care of your insertion site. Make sure you: ? Wash your hands with soap and water before you change your bandage (dressing). If soap and water are not available, use hand sanitizer. ? Change your dressing as told by your health care provider. ? Leave stitches (sutures), skin glue, or adhesive strips in place. These skin closures may need to stay in place for 2 weeks or longer. If adhesive strip edges start to loosen and curl up, you may trim the loose edges. Do not remove adhesive strips completely unless your health care provider tells you to do that.  Do not take baths, swim, or use a hot tub until your health care provider approves.  You may shower 24-48 hours after the procedure or as told by your health care provider. ? Gently wash the site with plain soap and water. ? Pat the area dry with a clean towel. ? Do not rub the site. This may cause bleeding.  Do not apply powder or lotion to the site. Keep the site clean and dry.  Check your femoral site every day for signs of infection. Check for: ? Redness, swelling, or pain. ? Fluid or blood. ? Warmth. ? Pus or a bad smell. Activity  For the first 2-3 days after your procedure, or as long as directed: ? Avoid climbing stairs as much as possible. ? Do not squat.  Do not lift anything that is heavier than 10 lb (4.5 kg), or the limit that you are told, until your health care provider says that it is safe.  Rest as  directed. ? Avoid sitting for a long time without moving. Get up to take short walks every 1-2 hours.  Do not drive for 24 hours if you were given a medicine to help you relax (sedative). General instructions  Take over-the-counter and prescription medicines only as told by your health care provider.  Keep all follow-up visits as told by your health care provider. This is important. Contact a health care provider if you have:  A fever or chills.  You have redness, swelling, or pain around your insertion site. Get help right away if:  The catheter insertion area swells very fast.  You pass out.  You suddenly start to sweat or your skin gets clammy.  The catheter insertion area is bleeding, and the bleeding does not stop when you hold steady pressure on the area.  The area near or just beyond the catheter insertion site becomes pale, cool, tingly, or numb. These symptoms may represent a serious problem that is an emergency. Do not wait to see if the symptoms will go away. Get medical help right away. Call your local emergency services (911 in the [place] Do not drive yourself to the hospital. Summary  After the procedure, it is common to have bruising that usually fades within 1-2 weeks.  Check your femoral site every day for signs of infection.  Do not lift anything that is heavier than 10 lb (4.5 kg), or   the limit that you are told, until your health care provider says that it is safe. This information is not intended to replace advice given to you by your health care provider. Make sure you discuss any questions you have with your health care provider. Document Revised: 11/21/2019 Document Reviewed: 11/21/2019 Elsevier Patient Education  2021 Elsevier Inc.  

## 2020-06-22 ENCOUNTER — Encounter (HOSPITAL_COMMUNITY): Payer: Self-pay | Admitting: Internal Medicine

## 2020-06-22 MED FILL — Lidocaine HCl Local Preservative Free (PF) Inj 1%: INTRAMUSCULAR | Qty: 30 | Status: AC

## 2020-06-22 MED FILL — Lidocaine HCl Local Inj 1%: INTRAMUSCULAR | Qty: 40 | Status: AC

## 2020-06-22 MED FILL — Isoproterenol HCl Inj 0.2 MG/ML: INTRAMUSCULAR | Qty: 5 | Status: AC

## 2020-06-28 ENCOUNTER — Other Ambulatory Visit: Payer: Self-pay

## 2020-06-28 MED ORDER — CARVEDILOL 12.5 MG PO TABS: 12.5000 mg | ORAL_TABLET | Freq: Two times a day (BID) | ORAL | 3 refills | Status: DC

## 2020-07-02 ENCOUNTER — Other Ambulatory Visit: Payer: Self-pay | Admitting: *Deleted

## 2020-07-02 MED ORDER — NITROGLYCERIN 0.4 MG SL SUBL: 0.4000 mg | SUBLINGUAL_TABLET | SUBLINGUAL | 2 refills | Status: DC | PRN

## 2020-07-02 MED ORDER — APIXABAN 5 MG PO TABS: 5.0000 mg | ORAL_TABLET | Freq: Two times a day (BID) | ORAL | 1 refills | Status: DC

## 2020-07-02 MED ORDER — FUROSEMIDE 40 MG PO TABS: 40.0000 mg | ORAL_TABLET | Freq: Every day | ORAL | 3 refills | Status: DC

## 2020-07-02 MED ORDER — ENTRESTO 24-26 MG PO TABS: 1.0000 | ORAL_TABLET | Freq: Two times a day (BID) | ORAL | 6 refills | Status: DC

## 2020-07-02 MED ORDER — POTASSIUM CHLORIDE CRYS ER 20 MEQ PO TBCR: 20.0000 meq | EXTENDED_RELEASE_TABLET | Freq: Every day | ORAL | 3 refills | Status: DC

## 2020-07-02 NOTE — Telephone Encounter (Signed)
Prescription refill request for Eliquis received. Indication: Last office visit:3/22 taylor Scr:1.2  3/22 Age: 55 Weight:119.3 kg  Prescription refilled

## 2020-07-06 ENCOUNTER — Telehealth: Payer: Self-pay | Admitting: *Deleted

## 2020-07-06 NOTE — Telephone Encounter (Signed)
   [facility] Pre-operative Risk Assessment    Patient Name: Billy [name]  DOB: [birth year]  MRN: [id number]   HEARTCARE STAFF: - Please ensure there is not already an duplicate clearance open for this procedure. - Under Visit Info/Reason for Call, type in Other and utilize the format Clearance MM/DD/YY or Clearance TBD. Do not use dashes or single digits. - If request is for dental extraction, please clarify the # of teeth to be extracted.  Request for surgical clearance:  1. What type of surgery is being performed? Tooth extraction of 1 tooth   2. When is this surgery scheduled? TBD   3. What type of clearance is required (medical clearance vs. Pharmacy clearance to hold med vs. Both)? medical  4. Are there any medications that need to be held prior to surgery and how long?none   5. Practice name and name of physician performing surgery? [facility]   6. What is the office phone number? [phone]   7.   What is the office fax number? [phone]  8.   Anesthesia type (None, local, MAC, general) ? lidiocaine or mepivacaine   They are concerned about the pacemaker and if SBE is required.   Billy [name] 07/06/2020, 11:25 AM  _________________________________________________________________   (provider comments below)

## 2020-07-06 NOTE — Telephone Encounter (Signed)
   Patient Name: Billy [name]  DOB: [birth year]  MRN: [id number]   Primary Cardiologist: Kirk Ruths, MD  Chart reviewed as part of pre-operative protocol coverage. Simple dental extractions are considered low risk procedures per guidelines and generally do not require any specific cardiac clearance. It is also generally accepted that for simple extractions and dental cleanings, there is no need to interrupt blood thinner therapy.  SBE prophylaxis is not required for the patient from a cardiac standpoint.  I will route this recommendation to the requesting party via Epic fax function and remove from pre-op pool.  Please call with questions.  Darreld Mclean, PA-C 07/06/2020, 1:12 PM

## 2020-07-06 NOTE — Telephone Encounter (Signed)
That is correct. SBE prophylaxis is NOT needed for ICD/pacemakers

## 2020-07-06 NOTE — Telephone Encounter (Addendum)
Pharmacy, just wanted to confirm something with you. Patient does not need SBE prophylaxis prior to dental procedure if he has PPM/ICD correct?   Thank you!

## 2020-07-08 DIAGNOSIS — Z79899 Other long term (current) drug therapy: Secondary | ICD-10-CM

## 2020-07-08 DIAGNOSIS — I255 Ischemic cardiomyopathy: Secondary | ICD-10-CM

## 2020-07-08 NOTE — Telephone Encounter (Signed)
Spoke with pt and pt's wife who reports increasing edema in his stomach and face x 2 days. Complaining of minimal DOE this am. Pt is uncertain if he has had an increase in his weight.  He is taking current medications as prescribed and denies current CP.  Pt had SVt ablation by Dr Cristopher Peru on 06/21/2020. Will forward information to Dr Stanford Breed, Dr Caryl Comes and Dr Lovena Le for review and recommendation.  Reviewed ED precautions with pt.  Pt verbalizes understanding and agrees with current plan.

## 2020-07-08 NOTE — Telephone Encounter (Signed)
Spoke with pt and advised per Dr Stanford Breed:  Take an additional 40 mg of Lasix today and tomorrow and then resume previous dose of 40 mg daily. Check potassium and renal function in 1 week.  Billy [name]   Pt verbalizes understanding of instructions given and would like to have labs drawn at Dr Olin Pia office.  Appointment scheduled for 07/15/2020.

## 2020-07-15 ENCOUNTER — Other Ambulatory Visit: Payer: Self-pay

## 2020-07-15 ENCOUNTER — Other Ambulatory Visit: Payer: Medicare HMO | Admitting: *Deleted

## 2020-07-15 ENCOUNTER — Other Ambulatory Visit: Payer: Self-pay | Admitting: *Deleted

## 2020-07-15 DIAGNOSIS — Z79899 Other long term (current) drug therapy: Secondary | ICD-10-CM

## 2020-07-15 DIAGNOSIS — I255 Ischemic cardiomyopathy: Secondary | ICD-10-CM

## 2020-07-15 LAB — BASIC METABOLIC PANEL
BUN/Creatinine Ratio: 12 (ref 9–20)
BUN: 15 mg/dL (ref 6–24)
CO2: 22 mmol/L (ref 20–29)
Calcium: 9 mg/dL (ref 8.7–10.2)
Chloride: 103 mmol/L (ref 96–106)
Creatinine, Ser: 1.28 mg/dL — ABNORMAL HIGH (ref 0.76–1.27)
Glucose: 117 mg/dL — ABNORMAL HIGH (ref 65–99)
Potassium: 4 mmol/L (ref 3.5–5.2)
Sodium: 140 mmol/L (ref 134–144)
eGFR: 67 mL/min/{1.73_m2} (ref >59)

## 2020-07-23 ENCOUNTER — Other Ambulatory Visit: Payer: Self-pay

## 2020-07-23 ENCOUNTER — Encounter: Payer: Self-pay | Admitting: Internal Medicine

## 2020-07-23 ENCOUNTER — Ambulatory Visit (INDEPENDENT_AMBULATORY_CARE_PROVIDER_SITE_OTHER): Payer: Medicare HMO | Admitting: Internal Medicine

## 2020-07-23 VITALS — BP 138/70 | HR 58 | Ht 71.0 in | Wt 262.0 lb

## 2020-07-23 DIAGNOSIS — I471 Supraventricular tachycardia: Secondary | ICD-10-CM

## 2020-07-23 DIAGNOSIS — Z95810 Presence of automatic (implantable) cardiac defibrillator: Secondary | ICD-10-CM

## 2020-07-23 DIAGNOSIS — I255 Ischemic cardiomyopathy: Secondary | ICD-10-CM | POA: Diagnosis not present

## 2020-07-23 NOTE — Progress Notes (Signed)
HPI Mr. Zwahlen returns today for followup after undergoing EP study and catheter ablation of atrial tachycardia. He has an ischemic CM and VT, s/p ICD insertion with mutliple ICD therapies. He was noted to have tachy induced tachy with what appeared to be SVT inducing VT/VF and resulted in ICD shocks.  He was found to have almost incessant atrial tachycardia in the RA and had successful ablation. He is much improved. No ICD therapies. He does not have palpitations.  No Known Allergies   Current Outpatient Medications  Medication Sig Dispense Refill  . acetaminophen (TYLENOL) 500 MG tablet Take 1,000 mg by mouth every 6 (six) hours as needed for moderate pain or headache.    [name] amiodarone (PACERONE) 400 MG tablet Take 200 mg by mouth daily.    [name] apixaban (ELIQUIS) 5 MG TABS tablet Take 1 tablet (5 mg total) by mouth 2 (two) times daily. 180 tablet 1  . atorvastatin (LIPITOR) 80 MG tablet TAKE 1 TABLET BY MOUTH EVERY DAY (Patient taking differently: Take 80 mg by mouth at bedtime.) 90 tablet 3  . carvedilol (COREG) 12.5 MG tablet Take 1 tablet (12.5 mg total) by mouth 2 (two) times daily with a meal. 180 tablet 3  . cetirizine (ZYRTEC ALLERGY) 10 MG tablet Take 1 tablet (10 mg total) by mouth daily. (Patient taking differently: Take 10 mg by mouth daily as needed for allergies.) 30 tablet 0  . clopidogrel (PLAVIX) 75 MG tablet Take 1 tablet (75 mg total) by mouth daily. 90 tablet 3  . ezetimibe (ZETIA) 10 MG tablet TAKE 1 TABLET BY MOUTH EVERY DAY (Patient taking differently: Take 10 mg by mouth daily.) 90 tablet 3  . fluticasone (FLONASE) 50 MCG/ACT nasal spray Place 2 sprays into both nostrils daily as needed for allergies or rhinitis. 16 g 11  . furosemide (LASIX) 40 MG tablet Take 1 tablet (40 mg total) by mouth daily. 90 tablet 3  . isosorbide mononitrate (IMDUR) 30 MG 24 hr tablet Take 1 tablet (30 mg total) by mouth daily. 90 tablet 3  . montelukast (SINGULAIR) 10 MG tablet Take 1  tablet (10 mg total) by mouth daily as needed (allergies). 90 tablet 1  . Multiple Vitamin (MULTIVITAMIN WITH MINERALS) TABS tablet Take 1 tablet by mouth daily.    . nitroGLYCERIN (NITROSTAT) 0.4 MG SL tablet Place 1 tablet (0.4 mg total) under the tongue every 5 (five) minutes as needed for chest pain. 75 tablet 2  . pantoprazole (PROTONIX) 40 MG tablet Take 1 tablet (40 mg total) by mouth daily. 90 tablet 1  . potassium chloride SA (KLOR-CON M20) 20 MEQ tablet Take 1 tablet (20 mEq total) by mouth daily. 90 tablet 3  . sacubitril-valsartan (ENTRESTO) 24-26 MG Take 1 tablet by mouth 2 (two) times daily. 60 tablet 6   No current facility-administered medications for this visit.     Past Medical History:  Diagnosis Date  . Aortic atherosclerosis ([facility])    on CXR  . Blood in stool   . C. difficile colitis    a. remote hx 2010.  . Cardiac arrest - ventricular fibrillation 02/11/2009   a. 02/2009 s/p St Jude ICD.  [name] Chronic systolic CHF (congestive heart failure) ([place])   . Coronary artery disease    a. PCI to Cx age 86. b. CABGx4 in 2003. c. BMS to [place] in 12/2007.  [name] Former tobacco use   . Hyperlipidemia   . Hypertension   . Ischemic cardiomyopathy   .  Marijuana abuse   . Myocardial infarct ([name])    NON-ST-SEGMENT ELEVATION MI  . Obesity   . PAD (peripheral artery disease) ([facility])    a. RLE by noninvasive testing - managed medically for now.  . Ventricular tachyarrhythmia ([place])   . Ventricular tachycardia ([place])     ROS:   All systems reviewed and negative except as noted in the HPI.   Past Surgical History:  Procedure Laterality Date  . ABDOMINAL AORTOGRAM W/LOWER EXTREMITY N/A 05/14/2019   Procedure: ABDOMINAL AORTOGRAM W/LOWER EXTREMITY;  Surgeon: Wellington Hampshire, MD;  Location: [facility];  Service: Cardiovascular;  Laterality: N/A;  . CARDIAC DEFIBRILLATOR PLACEMENT     STJ single chamber ICD implanted for secondary prevention  . CIRCUMCISION    . CORONARY  ARTERY BYPASS GRAFT  06/17/01   X 4  . CORONARY BALLOON ANGIOPLASTY N/A 01/22/2019   Procedure: CORONARY BALLOON ANGIOPLASTY;  Surgeon: Leonie Man, MD;  Location: [facility];  Service: Cardiovascular;  Laterality: N/A;  . LEFT HEART CATH AND CORS/GRAFTS ANGIOGRAPHY N/A 01/22/2019   Procedure: LEFT HEART CATH AND CORS/GRAFTS ANGIOGRAPHY;  Surgeon: Leonie Man, MD;  Location: [facility];  Service: Cardiovascular;  Laterality: N/A;  . LEFT HEART CATH AND CORS/GRAFTS ANGIOGRAPHY N/A 05/18/2020   Procedure: LEFT HEART CATH AND CORS/GRAFTS ANGIOGRAPHY;  Surgeon: Troy Sine, MD;  Location: [facility];  Service: Cardiovascular;  Laterality: N/A;  . PERIPHERAL VASCULAR INTERVENTION Right 05/14/2019   Procedure: PERIPHERAL VASCULAR INTERVENTION;  Surgeon: Wellington Hampshire, MD;  Location: [facility];  Service: Cardiovascular;  Laterality: Right;  . SVT ABLATION N/A 06/21/2020   Procedure: SVT ABLATION;  Surgeon: Evans Lance, MD;  Location: [facility];  Service: Cardiovascular;  Laterality: N/A;     Family History  Problem Relation Age of Onset  . Hypertension Mother   . Heart attack Father        DIED AT 71 FROM MI  . Heart disease Sister        CAD AND PREVIOUS CABG  . Hypertension Other        IN MOST OF HIS SIBLINGS     Social History   Socioeconomic History  . Marital status: Married    Spouse name: Not on file  . Number of children: 3  . Years of education: 74  . Highest education level: Not on file  Occupational History  . Occupation: Disability  Tobacco Use  . Smoking status: Former Smoker    Packs/day: 2.00    Years: 20.00    Pack years: 40.00    Quit date: 04/04/2003    Years since quitting: 17.3  . Smokeless tobacco: Never Used  Vaping Use  . Vaping Use: Never used  Substance and Sexual Activity  . Alcohol use: No  . Drug use: No  . Sexual activity: Yes    Partners: Female  Other Topics Concern  . Not on file   Social History Narrative   MARRIED   FORMER TOBACCO USE, SMOKED FOR 20 YRS 2 PPD; QUIT IN 2005   NO ETOH   NO ILLICIT DRUG USE   NO REGULAR EXERCISE         ICD-ST. JUDE ; CERTIFIED LETTER SENT AND RETURNED BAD ADDRESS, PLS UPDATE DJW.      Fun: Physiological scientist Strain: Not on file  Food Insecurity: Not on file  Transportation  Needs: Not on file  Physical Activity: Not on file  Stress: Not on file  Social Connections: Not on file  Intimate Partner Violence: Not on file     Pulse (!) 58   Ht 5\' 11"  (1.803 m)   Wt 262 lb (118.8 kg)   SpO2 94%   BMI 36.54 kg/m   Physical Exam:  Well appearing but overweight middle aged man, NAD HEENT: Unremarkable Neck:  No JVD, no thyromegally Lymphatics:  No adenopathy Back:  No CVA tenderness Lungs:  Clear with no wheezes HEART:  Regular rate rhythm, no murmurs, no rubs, no clicks Abd:  soft, positive bowel sounds, no organomegally, no rebound, no guarding Ext:  2 plus pulses, no edema, no cyanosis, no clubbing Skin:  No rashes no nodules Neuro:  CN II through XII intact, motor grossly intact  EKG - nsr  DEVICE  Normal device function.  See PaceArt for details.   Assess/Plan: 1.  SVT - He is s/p EPS/RFA of atrial tachycardia and doing well with no recurrent SVT. He will undergo watchful waiting. 2. PMVT - He has not had any and will continue amiodarone 200 mg daily. I will defer stopping or reducing the dose of amio when he sees Dr. Renaldo Reel back in 6 months. 3. ICD - his device is working normally. He is less than a year from ERI.  4. Obesity - I encouraged him to lose 10 lbs.   Carleene Overlie Jaymon Dudek,MD

## 2020-07-23 NOTE — Patient Instructions (Addendum)
Medication Instructions:  Your physician recommends that you continue on your current medications as directed. Please refer to the Current Medication list given to you today.  Labwork: None ordered.  Testing/Procedures: None ordered.  Follow-Up: Your physician wants you to follow-up in: 6 months with Caryl Comes, MD or one of the following Advanced Practice Providers on your designated Care Team:    Chanetta Marshall, NP  Tommye Standard, PA-C  Legrand Como "Jonni Sanger" [place], [place]  Remote monitoring is used to monitor your ICD from home. This monitoring reduces the number of office visits required to check your device to one time per year. It allows [place] to keep an eye on the functioning of your device to ensure it is working properly. You are scheduled for a device check from home on 08/25/2020. You may send your transmission at any time that day. If you have a wireless device, the transmission will be sent automatically. After your physician reviews your transmission, you will receive a postcard with your next transmission date.  Any Other Special Instructions Will Be Listed Below (If Applicable).  If you need a refill on your cardiac medications before your next appointment, please call your pharmacy.

## 2020-08-17 ENCOUNTER — Ambulatory Visit: Payer: Medicare HMO | Admitting: Dermatology

## 2020-08-17 ENCOUNTER — Other Ambulatory Visit: Payer: Self-pay

## 2020-08-17 ENCOUNTER — Encounter: Payer: Self-pay | Admitting: Dermatology

## 2020-08-17 DIAGNOSIS — B351 Tinea unguium: Secondary | ICD-10-CM | POA: Diagnosis not present

## 2020-08-17 DIAGNOSIS — D170 Benign lipomatous neoplasm of skin and subcutaneous tissue of head, face and neck: Secondary | ICD-10-CM

## 2020-08-17 NOTE — Patient Instructions (Signed)
Dr Darrin Luis    CONTACT us AT [phone]. [place]: [address] [place], [place] [address]. [phone]  [place]: [address] [place], [place] [address]. [phone] (Inside the [facility] Dermatology Office)  [place]: [address], [place], [place] [address]. [phone]  Please note we cannot give medical advice through e-mail. For general inquiries, please direct your e-mail to  [email or web address] or use the form below.  You may also contact us through our secure PATIENT PORTAL  -  [email or web address]   Dr. Evelina Bucy:  [email or web address]

## 2020-08-22 ENCOUNTER — Other Ambulatory Visit: Payer: Self-pay | Admitting: Cardiology

## 2020-08-23 ENCOUNTER — Other Ambulatory Visit: Payer: Self-pay | Admitting: Cardiology

## 2020-08-23 DIAGNOSIS — Z951 Presence of aortocoronary bypass graft: Secondary | ICD-10-CM

## 2020-08-23 MED ORDER — ATORVASTATIN CALCIUM 80 MG PO TABS: 1.0000 | ORAL_TABLET | Freq: Every day | ORAL | 3 refills | Status: DC

## 2020-08-23 NOTE — Progress Notes (Signed)
HPI: FU CAD. Patient suffered a cardiac arrest in November of 2010. Echocardiogram initially revealed an EF of 10-15% however followup echo showed an EF of 40-45. Given out of the hospital VF arrest a St. Jude single lead ICD was placed. Also had episode of atrial fibrillation November 2020 and dyspnea with Brilinta.  Cardiac catheterization February 2022 showed old occlusion of the first diagonal, old occlusion of the mid circumflex but with good collateralization to the distal vessel, mid total occlusion of the right coronary artery with collateralization to the distal PDA and PLA, atretic LIMA to the LAD, patent SVG to the diagonal and occlusion of the saphenous vein graft to the OM 2 and of the SVG to distal right coronary artery.  Medical therapy recommended.  Echocardiogram March 2022 showed ejection fraction 40 to 27%, grade 1 diastolic dysfunction, moderate RV dysfunction.  Patient also with peripheral vascular disease followed by Dr. Fletcher Anon; he has had previous stent to the right external iliac artery.  Also with previous ablation of atrial tachycardia.  On amiodarone for ventricular tachycardia.  Since I last saw him,he denies syncope.  Dyspnea with more vigorous activities.  No orthopnea, PND, pedal edema.  Occasional pain in his chest for 1 minute not exertional.  Current Outpatient Medications  Medication Sig Dispense Refill  . acetaminophen (TYLENOL) 500 MG tablet Take 1,000 mg by mouth every 6 (six) hours as needed for moderate pain or headache.    [name] amiodarone (PACERONE) 400 MG tablet Take 200 mg by mouth daily. Take 200 mg in AM and 200 mg in PM.    . apixaban (ELIQUIS) 5 MG TABS tablet Take 1 tablet (5 mg total) by mouth 2 (two) times daily. 180 tablet 1  . atorvastatin (LIPITOR) 80 MG tablet Take 1 tablet (80 mg total) by mouth daily. 90 tablet 3  . carvedilol (COREG) 12.5 MG tablet Take 1 tablet (12.5 mg total) by mouth 2 (two) times daily with a meal. 180 tablet 3  .  cetirizine (ZYRTEC ALLERGY) 10 MG tablet Take 1 tablet (10 mg total) by mouth daily. 30 tablet 0  . clopidogrel (PLAVIX) 75 MG tablet TAKE 1 TABLET BY MOUTH EVERY DAY 90 tablet 3  . ezetimibe (ZETIA) 10 MG tablet TAKE 1 TABLET BY MOUTH EVERY DAY 90 tablet 3  . fluticasone (FLONASE) 50 MCG/ACT nasal spray Place 2 sprays into both nostrils daily as needed for allergies or rhinitis. 16 g 11  . furosemide (LASIX) 40 MG tablet Take 1 tablet (40 mg total) by mouth daily. 90 tablet 3  . isosorbide mononitrate (IMDUR) 30 MG 24 hr tablet Take 1 tablet (30 mg total) by mouth daily. (Patient taking differently: Take 30 mg by mouth in the morning and at bedtime.) 90 tablet 3  . montelukast (SINGULAIR) 10 MG tablet Take 1 tablet (10 mg total) by mouth daily as needed (allergies). 90 tablet 1  . Multiple Vitamin (MULTIVITAMIN WITH MINERALS) TABS tablet Take 1 tablet by mouth daily.    . nitroGLYCERIN (NITROSTAT) 0.4 MG SL tablet Place 1 tablet (0.4 mg total) under the tongue every 5 (five) minutes as needed for chest pain. 75 tablet 2  . pantoprazole (PROTONIX) 40 MG tablet Take 1 tablet (40 mg total) by mouth daily. 90 tablet 1  . potassium chloride SA (KLOR-CON M20) 20 MEQ tablet Take 1 tablet (20 mEq total) by mouth daily. 90 tablet 3  . sacubitril-valsartan (ENTRESTO) 24-26 MG Take 1 tablet by mouth 2 (two)  times daily. 60 tablet 6   No current facility-administered medications for this visit.     Past Medical History:  Diagnosis Date  . Aortic atherosclerosis ([place])    on CXR  . Blood in stool   . C. difficile colitis    a. remote hx 2010.  . Cardiac arrest - ventricular fibrillation 02/11/2009   a. 02/2009 s/p St Jude ICD.  [name] Chronic systolic CHF (congestive heart failure) ([place])   . Coronary artery disease    a. PCI to Cx age 47. b. CABGx4 in 2003. c. BMS to [place] in 12/2007.  [name] Former tobacco use   . Hyperlipidemia   . Hypertension   . Ischemic cardiomyopathy   . Marijuana abuse   . Myocardial  infarct ([name])    NON-ST-SEGMENT ELEVATION MI  . Obesity   . PAD (peripheral artery disease) ([facility])    a. RLE by noninvasive testing - managed medically for now.  . Ventricular tachyarrhythmia ([facility])   . Ventricular tachycardia [facility])     Past Surgical History:  Procedure Laterality Date  . ABDOMINAL AORTOGRAM W/LOWER EXTREMITY N/A 05/14/2019   Procedure: ABDOMINAL AORTOGRAM W/LOWER EXTREMITY;  Surgeon: Wellington Hampshire, MD;  Location: [facility];  Service: Cardiovascular;  Laterality: N/A;  . CARDIAC DEFIBRILLATOR PLACEMENT     STJ single chamber ICD implanted for secondary prevention  . CIRCUMCISION    . CORONARY ARTERY BYPASS GRAFT  06/17/01   X 4  . CORONARY BALLOON ANGIOPLASTY N/A 01/22/2019   Procedure: CORONARY BALLOON ANGIOPLASTY;  Surgeon: Leonie Man, MD;  Location: [facility];  Service: Cardiovascular;  Laterality: N/A;  . LEFT HEART CATH AND CORS/GRAFTS ANGIOGRAPHY N/A 01/22/2019   Procedure: LEFT HEART CATH AND CORS/GRAFTS ANGIOGRAPHY;  Surgeon: Leonie Man, MD;  Location: [facility];  Service: Cardiovascular;  Laterality: N/A;  . LEFT HEART CATH AND CORS/GRAFTS ANGIOGRAPHY N/A 05/18/2020   Procedure: LEFT HEART CATH AND CORS/GRAFTS ANGIOGRAPHY;  Surgeon: Troy Sine, MD;  Location: [facility];  Service: Cardiovascular;  Laterality: N/A;  . PERIPHERAL VASCULAR INTERVENTION Right 05/14/2019   Procedure: PERIPHERAL VASCULAR INTERVENTION;  Surgeon: Wellington Hampshire, MD;  Location: [facility];  Service: Cardiovascular;  Laterality: Right;  . SVT ABLATION N/A 06/21/2020   Procedure: SVT ABLATION;  Surgeon: Evans Lance, MD;  Location: [facility];  Service: Cardiovascular;  Laterality: N/A;    Social History   Socioeconomic History  . Marital status: Married    Spouse name: Not on file  . Number of children: 3  . Years of education: 8  . Highest education level: Not on file  Occupational History  . Occupation:  Disability  Tobacco Use  . Smoking status: Former Smoker    Packs/day: 2.00    Years: 20.00    Pack years: 40.00    Quit date: 04/04/2003    Years since quitting: 17.4  . Smokeless tobacco: Never Used  Vaping Use  . Vaping Use: Never used  Substance and Sexual Activity  . Alcohol use: No  . Drug use: No  . Sexual activity: Yes    Partners: Female  Other Topics Concern  . Not on file  Social History Narrative   MARRIED   FORMER TOBACCO USE, SMOKED FOR 20 YRS 2 PPD; QUIT IN 2005   NO ETOH   NO ILLICIT DRUG USE   NO REGULAR EXERCISE         ICD-ST. JUDE ;  CERTIFIED LETTER SENT AND RETURNED BAD ADDRESS, PLS UPDATE DJW.      Fun: Physiological scientist Strain: Not on file  Food Insecurity: Not on file  Transportation Needs: Not on file  Physical Activity: Not on file  Stress: Not on file  Social Connections: Not on file  Intimate Partner Violence: Not on file    Family History  Problem Relation Age of Onset  . Hypertension Mother   . Heart attack Father        DIED AT 32 FROM MI  . Heart disease Sister        CAD AND PREVIOUS CABG  . Hypertension Other        IN MOST OF HIS SIBLINGS    ROS: no fevers or chills, productive cough, hemoptysis, dysphasia, odynophagia, melena, hematochezia, dysuria, hematuria, rash, seizure activity, orthopnea, PND, pedal edema, claudication. Remaining systems are negative.  Physical Exam: Well-developed well-nourished in no acute distress.  Skin is warm and dry.  HEENT is normal.  Neck is supple.  Chest is clear to auscultation with normal expansion.  Cardiovascular exam is regular rate and rhythm.  Abdominal exam nontender or distended. No masses palpated. Extremities show no edema. neuro grossly intact  A/P  1 coronary artery disease-recent catheterization as outlined above.  Plan medical therapy.  Continue Plavix, beta-blocker and statin.  2 ischemic cardiomyopathy-continue Entresto  and carvedilol.  3 hypertension-blood pressure controlled.  Continue present medical regimen.  4 hyperlipidemia-continue statin.  5 paroxysmal atrial fibrillation-he remains in sinus rhythm.  Continue beta-blocker and apixaban.  6 ICD-Per EP.  7 peripheral vascular disease-followed by Dr. Fletcher Anon.  8 history of SVT-status post ablation.  9 ventricular tachycardia-continue amiodarone.  Check TSH and liver functions.  Kirk Ruths, MD

## 2020-08-23 NOTE — Telephone Encounter (Signed)
   *  STAT* If patient is at the pharmacy, call can be transferred to refill team.   1. Which medications need to be refilled? (please list name of each medication and dose if known) atorvastatin (LIPITOR) 80 MG tablet  2. Which pharmacy/location (including street and city if local pharmacy) is medication to be sent to?[id number] - , [place] - [address]  3. Do they need a 30 day or 90 day supply? 90 days  Pt only have 1 pill left, need refill today

## 2020-08-23 NOTE — Telephone Encounter (Signed)
This is Dr. Crenshaw's pt 

## 2020-08-23 NOTE — Telephone Encounter (Signed)
Rx(s) sent to pharmacy electronically.  

## 2020-08-25 ENCOUNTER — Ambulatory Visit (INDEPENDENT_AMBULATORY_CARE_PROVIDER_SITE_OTHER): Payer: Medicare HMO

## 2020-08-25 DIAGNOSIS — I4901 Ventricular fibrillation: Secondary | ICD-10-CM

## 2020-08-26 LAB — CUP PACEART REMOTE DEVICE CHECK
Battery Remaining Longevity: 11 mo
Battery Remaining Percentage: 9 %
Battery Voltage: 2.66 V
Brady Statistic RV Percent Paced: 1 %
Date Time Interrogation Session: 20220525030617
HighPow Impedance: 56 Ohm
Implantable Lead Implant Date: 20101111
Implantable Lead Location: 753860
Implantable Lead Model: 7121
Implantable Pulse Generator Implant Date: 20101111
Lead Channel Impedance Value: 490 Ohm
Lead Channel Pacing Threshold Amplitude: 1 V
Lead Channel Pacing Threshold Pulse Width: 0.5 ms
Lead Channel Sensing Intrinsic Amplitude: 11.7 mV
Lead Channel Setting Pacing Amplitude: 2.5 V
Lead Channel Setting Pacing Pulse Width: 0.5 ms
Lead Channel Setting Sensing Sensitivity: 0.5 mV
Pulse Gen Serial Number: 743367

## 2020-08-31 ENCOUNTER — Encounter: Payer: Self-pay | Admitting: Dermatology

## 2020-08-31 NOTE — Progress Notes (Signed)
   New Patient   Subjective  Billy [name] is a 55 y.o. male who presents for the following: Skin Problem (Right forehead cyst/lipoma x years).  Lipoma on forehead and check nails. Location:  Duration:  Quality:  Associated Signs/Symptoms: Modifying Factors:  Severity:  Timing: Context:    The following portions of the chart were reviewed this encounter and updated as appropriate:  Tobacco  Allergies  Meds  Problems  Med Hx  Surg Hx  Fam Hx      Objective  Well appearing patient in no apparent distress; mood and affect are within normal limits. Objective  Right Forehead: 3.5cm doughy subcutaneous nodule (see photographs).  Images      Objective  Left Hallux Toe Nail Plate, Right 2nd Finger Nail Plate, Right Hallux Toe Nail Plate: Chronic thickening, distal opacification with some subungual debris.    A focused skin examination was performed including hands, feet, head, and neck.   Assessment & Plan  Lipoma of face Right Forehead  I discussed in some detail the challenges of removing lipomas in general, and in this location in particular.  If he chooses removal, I would recommend it be done by plastic surgeon who has the option of either conscious sedation or short-term anesthesia.   Dr Darrin Luis  CONTACT [place] AT [phone]. [place]: [address] [place], [place] [address]. [phone]  [place]: [address] [place], [place] [address]. [phone] (Inside the [facility] Dermatology Office)  [employer]: [address] [place], [place] [address]. [phone]  Please note we cannot give medical advice through e-mail. For general inquiries, please direct your e-mail to  [email or web address] or use the form below.  You may also contact us through our secure PATIENT PORTAL  -  [email or web address]   Dr. Evelina Bucy:  [email or web address]  Toenail fungus (3) Right 2nd Finger Nail Plate;  Left Hallux Toe Nail Plate; Right Hallux Toe Nail Plate  With his multiple essential systemic medications, I discouraged him being on long-term oral antifungals.  He certainly may try any over-the-counter topical products.

## 2020-09-06 ENCOUNTER — Ambulatory Visit (INDEPENDENT_AMBULATORY_CARE_PROVIDER_SITE_OTHER): Payer: Medicare HMO | Admitting: Cardiology

## 2020-09-06 ENCOUNTER — Other Ambulatory Visit: Payer: Self-pay

## 2020-09-06 ENCOUNTER — Encounter: Payer: Self-pay | Admitting: Cardiology

## 2020-09-06 VITALS — BP 110/72 | HR 60 | Ht 72.0 in | Wt 268.2 lb

## 2020-09-06 DIAGNOSIS — I25119 Atherosclerotic heart disease of native coronary artery with unspecified angina pectoris: Secondary | ICD-10-CM | POA: Diagnosis not present

## 2020-09-06 DIAGNOSIS — I10 Essential (primary) hypertension: Secondary | ICD-10-CM

## 2020-09-06 DIAGNOSIS — I472 Ventricular tachycardia, unspecified: Secondary | ICD-10-CM

## 2020-09-06 DIAGNOSIS — E785 Hyperlipidemia, unspecified: Secondary | ICD-10-CM

## 2020-09-06 DIAGNOSIS — I255 Ischemic cardiomyopathy: Secondary | ICD-10-CM | POA: Diagnosis not present

## 2020-09-06 LAB — HEPATIC FUNCTION PANEL
ALT: 50 IU/L — ABNORMAL HIGH (ref 0–44)
AST: 30 IU/L (ref 0–40)
Albumin: 4.6 g/dL (ref 3.8–4.9)
Alkaline Phosphatase: 112 IU/L (ref 44–121)
Bilirubin Total: 0.6 mg/dL (ref 0.0–1.2)
Bilirubin, Direct: 0.17 mg/dL (ref 0.00–0.40)
Total Protein: 7.6 g/dL (ref 6.0–8.5)

## 2020-09-06 LAB — TSH: TSH: 6.57 u[IU]/mL — ABNORMAL HIGH (ref 0.450–4.500)

## 2020-09-06 NOTE — Patient Instructions (Signed)
  Lab Work:  Your physician recommends that you return for lab work in: today If you have labs (blood work) drawn today and your tests are completely normal, you will receive your results only by: [name] MyChart Message (if you have MyChart) OR . A paper copy in the mail If you have any lab test that is abnormal or we need to change your treatment, we will call you to review the results.  Follow-Up: At [facility], you and your health needs are our priority.  As part of our continuing mission to provide you with exceptional heart care, we have created designated Provider Care Teams.  These Care Teams include your primary Cardiologist (physician) and Advanced Practice Providers (APPs -  Physician Assistants and Nurse Practitioners) who all work together to provide you with the care you need, when you need it.  We recommend signing up for the patient portal called "MyChart".  Sign up information is provided on this After Visit Summary.  MyChart is used to connect with patients for Virtual Visits (Telemedicine).  Patients are able to view lab/test results, encounter notes, upcoming appointments, etc.  Non-urgent messages can be sent to your provider as well.   To learn more about what you can do with MyChart, go to [email or web address].    Your next appointment:   6 month(s)  The format for your next appointment:   In Person  Provider:   Kirk Ruths, MD

## 2020-09-12 ENCOUNTER — Other Ambulatory Visit: Payer: Self-pay | Admitting: Internal Medicine

## 2020-09-12 DIAGNOSIS — J302 Other seasonal allergic rhinitis: Secondary | ICD-10-CM

## 2020-09-17 NOTE — Progress Notes (Signed)
Remote ICD transmission.   

## 2020-10-11 ENCOUNTER — Ambulatory Visit: Payer: Medicare HMO | Admitting: Family

## 2020-10-11 ENCOUNTER — Ambulatory Visit: Payer: Medicare Other | Admitting: Internal Medicine

## 2020-10-22 ENCOUNTER — Ambulatory Visit: Payer: Medicare HMO | Admitting: Family Medicine

## 2020-11-11 DIAGNOSIS — Z8679 Personal history of other diseases of the circulatory system: Secondary | ICD-10-CM | POA: Diagnosis not present

## 2020-11-11 DIAGNOSIS — Z95810 Presence of automatic (implantable) cardiac defibrillator: Secondary | ICD-10-CM | POA: Diagnosis not present

## 2020-11-11 DIAGNOSIS — D170 Benign lipomatous neoplasm of skin and subcutaneous tissue of head, face and neck: Secondary | ICD-10-CM | POA: Diagnosis not present

## 2020-11-11 DIAGNOSIS — Z79899 Other long term (current) drug therapy: Secondary | ICD-10-CM | POA: Diagnosis not present

## 2020-11-11 DIAGNOSIS — I739 Peripheral vascular disease, unspecified: Secondary | ICD-10-CM | POA: Diagnosis not present

## 2020-11-11 DIAGNOSIS — K219 Gastro-esophageal reflux disease without esophagitis: Secondary | ICD-10-CM | POA: Diagnosis not present

## 2020-11-11 DIAGNOSIS — Z951 Presence of aortocoronary bypass graft: Secondary | ICD-10-CM | POA: Diagnosis not present

## 2020-11-19 ENCOUNTER — Other Ambulatory Visit: Payer: Self-pay | Admitting: *Deleted

## 2020-11-19 ENCOUNTER — Encounter: Payer: Self-pay | Admitting: *Deleted

## 2020-11-19 DIAGNOSIS — R7989 Other specified abnormal findings of blood chemistry: Secondary | ICD-10-CM

## 2020-11-24 ENCOUNTER — Ambulatory Visit (INDEPENDENT_AMBULATORY_CARE_PROVIDER_SITE_OTHER): Payer: Medicare HMO

## 2020-11-24 DIAGNOSIS — I255 Ischemic cardiomyopathy: Secondary | ICD-10-CM

## 2020-11-24 LAB — CUP PACEART REMOTE DEVICE CHECK
Battery Remaining Longevity: 8 mo
Battery Remaining Percentage: 6 %
Battery Voltage: 2.65 V
Brady Statistic RV Percent Paced: 1 %
Date Time Interrogation Session: 20220824033452
HighPow Impedance: 59 Ohm
Implantable Lead Implant Date: 20101111
Implantable Lead Location: 753860
Implantable Lead Model: 7121
Implantable Pulse Generator Implant Date: 20101111
Lead Channel Impedance Value: 510 Ohm
Lead Channel Pacing Threshold Amplitude: 1 V
Lead Channel Pacing Threshold Pulse Width: 0.5 ms
Lead Channel Sensing Intrinsic Amplitude: 11.7 mV
Lead Channel Setting Pacing Amplitude: 2.5 V
Lead Channel Setting Pacing Pulse Width: 0.5 ms
Lead Channel Setting Sensing Sensitivity: 0.5 mV
Pulse Gen Serial Number: 743367

## 2020-11-29 ENCOUNTER — Other Ambulatory Visit: Payer: Self-pay

## 2020-11-29 ENCOUNTER — Encounter: Payer: Self-pay | Admitting: Family Medicine

## 2020-11-29 ENCOUNTER — Other Ambulatory Visit: Payer: Self-pay | Admitting: Cardiology

## 2020-11-29 ENCOUNTER — Other Ambulatory Visit: Payer: Self-pay | Admitting: Internal Medicine

## 2020-11-29 ENCOUNTER — Ambulatory Visit (INDEPENDENT_AMBULATORY_CARE_PROVIDER_SITE_OTHER): Payer: Medicare HMO | Admitting: Family Medicine

## 2020-11-29 VITALS — BP 126/88 | HR 65 | Temp 97.1°F | Ht 72.0 in | Wt 276.0 lb

## 2020-11-29 DIAGNOSIS — E7800 Pure hypercholesterolemia, unspecified: Secondary | ICD-10-CM

## 2020-11-29 DIAGNOSIS — Z1211 Encounter for screening for malignant neoplasm of colon: Secondary | ICD-10-CM | POA: Diagnosis not present

## 2020-11-29 DIAGNOSIS — Z0000 Encounter for general adult medical examination without abnormal findings: Secondary | ICD-10-CM

## 2020-11-29 DIAGNOSIS — R0683 Snoring: Secondary | ICD-10-CM | POA: Diagnosis not present

## 2020-11-29 DIAGNOSIS — I10 Essential (primary) hypertension: Secondary | ICD-10-CM | POA: Diagnosis not present

## 2020-11-29 DIAGNOSIS — Z125 Encounter for screening for malignant neoplasm of prostate: Secondary | ICD-10-CM | POA: Insufficient documentation

## 2020-11-29 DIAGNOSIS — R7989 Other specified abnormal findings of blood chemistry: Secondary | ICD-10-CM | POA: Diagnosis not present

## 2020-11-29 DIAGNOSIS — Z951 Presence of aortocoronary bypass graft: Secondary | ICD-10-CM

## 2020-11-29 DIAGNOSIS — R7309 Other abnormal glucose: Secondary | ICD-10-CM | POA: Diagnosis not present

## 2020-11-29 DIAGNOSIS — E039 Hypothyroidism, unspecified: Secondary | ICD-10-CM | POA: Diagnosis not present

## 2020-11-29 LAB — URINALYSIS, ROUTINE W REFLEX MICROSCOPIC
Bilirubin Urine: NEGATIVE
Hgb urine dipstick: NEGATIVE
Ketones, ur: NEGATIVE
Leukocytes,Ua: NEGATIVE
Nitrite: NEGATIVE
RBC / HPF: NONE SEEN (ref 0–?)
Specific Gravity, Urine: 1.01 (ref 1.000–1.030)
Total Protein, Urine: NEGATIVE
Urine Glucose: NEGATIVE
Urobilinogen, UA: 0.2 (ref 0.0–1.0)
WBC, UA: NONE SEEN (ref 0–?)
pH: 6 (ref 5.0–8.0)

## 2020-11-29 LAB — TSH: TSH: 5.71 u[IU]/mL — ABNORMAL HIGH (ref 0.35–5.50)

## 2020-11-29 LAB — COMPREHENSIVE METABOLIC PANEL
ALT: 38 U/L (ref 0–53)
AST: 27 U/L (ref 0–37)
Albumin: 4.4 g/dL (ref 3.5–5.2)
Alkaline Phosphatase: 92 U/L (ref 39–117)
BUN: 20 mg/dL (ref 6–23)
CO2: 28 mEq/L (ref 19–32)
Calcium: 10.2 mg/dL (ref 8.4–10.5)
Chloride: 103 mEq/L (ref 96–112)
Creatinine, Ser: 1.59 mg/dL — ABNORMAL HIGH (ref 0.40–1.50)
GFR: 48.9 mL/min — ABNORMAL LOW (ref >60.00)
Glucose, Bld: 85 mg/dL (ref 70–99)
Potassium: 4.6 mEq/L (ref 3.5–5.1)
Sodium: 140 mEq/L (ref 135–145)
Total Bilirubin: 0.9 mg/dL (ref 0.2–1.2)
Total Protein: 7.7 g/dL (ref 6.0–8.3)

## 2020-11-29 LAB — CBC
HCT: 51 % (ref 39.0–52.0)
Hemoglobin: 16.8 g/dL (ref 13.0–17.0)
MCHC: 33 g/dL (ref 30.0–36.0)
MCV: 85.2 fl (ref 78.0–100.0)
Platelets: 265 10*3/uL (ref 150.0–400.0)
RBC: 5.99 Mil/uL — ABNORMAL HIGH (ref 4.22–5.81)
RDW: 14.5 % (ref 11.5–15.5)
WBC: 9.3 10*3/uL (ref 4.0–10.5)

## 2020-11-29 LAB — LIPID PANEL
Cholesterol: 153 mg/dL (ref 0–200)
HDL: 43.4 mg/dL (ref >39.00)
LDL Cholesterol: 85 mg/dL (ref 0–99)
NonHDL: 109.62
Total CHOL/HDL Ratio: 4
Triglycerides: 122 mg/dL (ref 0.0–149.0)
VLDL: 24.4 mg/dL (ref 0.0–40.0)

## 2020-11-29 LAB — HEMOGLOBIN A1C: Hgb A1c MFr Bld: 6.3 % (ref 4.6–6.5)

## 2020-11-29 LAB — LDL CHOLESTEROL, DIRECT: Direct LDL: 95 mg/dL

## 2020-11-29 LAB — PSA: PSA: 0.37 ng/mL (ref 0.10–4.00)

## 2020-11-29 MED ORDER — LEVOTHYROXINE SODIUM 50 MCG PO TABS
50.0000 ug | ORAL_TABLET | Freq: Every day | ORAL | 0 refills | Status: DC
Start: 2020-11-29 — End: 2021-01-14

## 2020-11-29 NOTE — Progress Notes (Addendum)
New Patient Office Visit  Subjective:  Patient ID: Billy [name], male    DOB: 1965-09-14  Age: 55 y.o. MRN: [id number]  CC:  Chief Complaint  Patient presents with   Establish Care    NP/establish care no concerns. Patient fasting     HPI Billy [name] presents for establishment of care, physical exam and follow-up of her recently elevated TSH.  He is accompanied by his wife.  He is disabled secondary to cardiac issues that include coronary artery disease congestive heart failure, and ischemic cardiomyopathy.  He has an implanted pacemaker with a defibrillator.  Fortunately he tells me that the defibrillator rarely goes off.  At this point he denies shortness of breath chest pain or dyspnea on exertion.  He is taking isosorbide mononitrate.  He does snore.  Wife believes that she has witnessed apneic episodes.  He denies cold intolerance, constipation or loss of hair.  Past Medical History:  Diagnosis Date   Aortic atherosclerosis ([place])    on CXR   Blood in stool    C. difficile colitis    a. remote hx 2010.   Cardiac arrest - ventricular fibrillation 02/11/2009   a. 02/2009 s/p St Jude ICD.   Chronic systolic CHF (congestive heart failure) ([name])    Coronary artery disease    a. PCI to Cx age 14. b. CABGx4 in 2003. c. BMS to [place] in 12/2007.   Former tobacco use    Hyperlipidemia    Hypertension    Ischemic cardiomyopathy    Marijuana abuse    Myocardial infarct ([place])    NON-ST-SEGMENT ELEVATION MI   Obesity    PAD (peripheral artery disease) ([place])    a. RLE by noninvasive testing - managed medically for now.   Ventricular tachyarrhythmia ([name])    Ventricular tachycardia [facility])     Past Surgical History:  Procedure Laterality Date   ABDOMINAL AORTOGRAM W/LOWER EXTREMITY N/A 05/14/2019   Procedure: ABDOMINAL AORTOGRAM W/LOWER EXTREMITY;  Surgeon: Wellington Hampshire, MD;  Location: [place] [facility];  Service: Cardiovascular;  Laterality: N/A;   CARDIAC DEFIBRILLATOR  PLACEMENT     STJ single chamber ICD implanted for secondary prevention   CIRCUMCISION     CORONARY ARTERY BYPASS GRAFT  06/17/01   X 4   CORONARY BALLOON ANGIOPLASTY N/A 01/22/2019   Procedure: CORONARY BALLOON ANGIOPLASTY;  Surgeon: Leonie Man, MD;  Location: [place] [facility];  Service: Cardiovascular;  Laterality: N/A;   LEFT HEART CATH AND CORS/GRAFTS ANGIOGRAPHY N/A 01/22/2019   Procedure: LEFT HEART CATH AND CORS/GRAFTS ANGIOGRAPHY;  Surgeon: Leonie Man, MD;  Location: [place] [facility];  Service: Cardiovascular;  Laterality: N/A;   LEFT HEART CATH AND CORS/GRAFTS ANGIOGRAPHY N/A 05/18/2020   Procedure: LEFT HEART CATH AND CORS/GRAFTS ANGIOGRAPHY;  Surgeon: Troy Sine, MD;  Location: [place] [facility];  Service: Cardiovascular;  Laterality: N/A;   PERIPHERAL VASCULAR INTERVENTION Right 05/14/2019   Procedure: PERIPHERAL VASCULAR INTERVENTION;  Surgeon: Wellington Hampshire, MD;  Location: [place] [facility];  Service: Cardiovascular;  Laterality: Right;   SVT ABLATION N/A 06/21/2020   Procedure: SVT ABLATION;  Surgeon: Evans Lance, MD;  Location: [place] [facility];  Service: Cardiovascular;  Laterality: N/A;    Family History  Problem Relation Age of Onset   Hypertension Mother    Heart attack Father        DIED AT 46 FROM MI   Heart disease Sister  CAD AND PREVIOUS CABG   Hypertension Other        IN MOST OF HIS SIBLINGS    Social History   Socioeconomic History   Marital status: Married    Spouse name: Not on file   Number of children: 3   Years of education: 11   Highest education level: Not on file  Occupational History   Occupation: Disability  Tobacco Use   Smoking status: Former    Packs/day: 2.00    Years: 20.00    Pack years: 40.00    Types: Cigarettes    Quit date: 04/04/2003    Years since quitting: 17.6   Smokeless tobacco: Never  Vaping Use   Vaping Use: Never used  Substance and Sexual Activity   Alcohol use: No   Drug use:  No   Sexual activity: Yes    Partners: Female  Other Topics Concern   Not on file  Social History Narrative   MARRIED   FORMER TOBACCO USE, SMOKED FOR 20 YRS 2 PPD; QUIT IN 2005   NO ETOH   NO ILLICIT DRUG USE   NO REGULAR EXERCISE         ICD-ST. JUDE ; CERTIFIED LETTER SENT AND RETURNED BAD ADDRESS, PLS UPDATE DJW.      Fun: Physiological scientist Strain: Not on file  Food Insecurity: Not on file  Transportation Needs: Not on file  Physical Activity: Not on file  Stress: Not on file  Social Connections: Not on file  Intimate Partner Violence: Not on file    ROS Review of Systems  Constitutional:  Negative for diaphoresis, fatigue, fever and unexpected weight change.  HENT: Negative.    Eyes:  Negative for photophobia and visual disturbance.  Respiratory:  Positive for apnea. Negative for shortness of breath and wheezing.   Cardiovascular:  Negative for chest pain and palpitations.  Gastrointestinal:  Negative for anal bleeding, blood in stool and constipation.  Endocrine: Negative for polyphagia and polyuria.  Genitourinary:  Negative for difficulty urinating, frequency and urgency.  Musculoskeletal:  Negative for gait problem and joint swelling.  Skin:  Negative for pallor and rash.  Neurological:  Negative for speech difficulty and weakness.  Hematological:  Does not bruise/bleed easily.  Psychiatric/Behavioral: Negative.     Depression screen [facility] 2/9 11/29/2020 04/12/2020 06/11/2019  Decreased Interest 0 0 0  Down, Depressed, Hopeless 0 0 0  PHQ - 2 Score 0 0 0  Altered sleeping - - -  Tired, decreased energy - - -  Change in appetite - - -  Feeling bad or failure about yourself  - - -  Trouble concentrating - - -  Moving slowly or fidgety/restless - - -  Suicidal thoughts - - -  PHQ-9 Score - - -  Some recent data might be hidden     Objective:   Today's Vitals: BP 126/88 (BP Location: Left Arm, Patient Position:  Sitting, Cuff Size: Large)   Pulse 65   Temp (!) 97.1 F (36.2 C) (Temporal)   Ht 6' (1.829 m)   Wt 276 lb (125.2 kg)   SpO2 95%   BMI 37.43 kg/m   Physical Exam Vitals and nursing note reviewed.  Constitutional:      General: He is not in acute distress.    Appearance: Normal appearance. He is obese. He is not ill-appearing, toxic-appearing or diaphoretic.  HENT:     Head: Normocephalic and atraumatic.  Right Ear: Tympanic membrane, ear canal and external ear normal.     Left Ear: Tympanic membrane, ear canal and external ear normal.     Mouth/Throat:     Mouth: Mucous membranes are moist.     Pharynx: Oropharynx is clear. No oropharyngeal exudate or posterior oropharyngeal erythema.   Eyes:     General: No scleral icterus.       Right eye: No discharge.        Left eye: No discharge.     Extraocular Movements: Extraocular movements intact.     Conjunctiva/sclera: Conjunctivae normal.     Pupils: Pupils are equal, round, and reactive to light.  Neck:     Vascular: No carotid bruit.  Cardiovascular:     Rate and Rhythm: Normal rate and regular rhythm.  Pulmonary:     Effort: Pulmonary effort is normal.     Breath sounds: Normal breath sounds.  Abdominal:     General: Bowel sounds are normal.  Musculoskeletal:     Cervical back: No rigidity or tenderness.     Right lower leg: No edema.     Left lower leg: No edema.  Lymphadenopathy:     Cervical: No cervical adenopathy.  Skin:    General: Skin is warm and dry.  Neurological:     Mental Status: He is alert and oriented to person, place, and time.  Psychiatric:        Mood and Affect: Mood normal.        Behavior: Behavior normal.    Assessment & Plan:   Problem List Items Addressed This Visit       Cardiovascular and Mediastinum   Essential hypertension - Primary   Relevant Orders   CBC (Completed)   Comprehensive metabolic panel (Completed)   Urinalysis, Routine w reflex microscopic (Completed)      Other   Elevated cholesterol   Relevant Orders   LDL cholesterol, direct (Completed)   Lipid panel (Completed)   Healthcare maintenance   Relevant Orders   Ambulatory referral to Gastroenterology   Elevated TSH   Relevant Orders   TSH (Completed)   Elevated glucose   Relevant Orders   Hemoglobin A1c (Completed)   Snores   Relevant Orders   Ambulatory referral to Sleep Studies   Morbid obesity ([place])   Other Visit Diagnoses     Hypothyroidism, unspecified type       Relevant Medications   levothyroxine (SYNTHROID) 50 MCG tablet       Outpatient Encounter Medications as of 11/29/2020  Medication Sig   acetaminophen (TYLENOL) 500 MG tablet Take 1,000 mg by mouth every 6 (six) hours as needed for moderate pain or headache.   amiodarone (PACERONE) 400 MG tablet Take 200 mg by mouth daily. Take 200 mg in AM and 200 mg in PM.   apixaban (ELIQUIS) 5 MG TABS tablet Take 1 tablet (5 mg total) by mouth 2 (two) times daily.   atorvastatin (LIPITOR) 80 MG tablet Take 1 tablet (80 mg total) by mouth daily.   carvedilol (COREG) 12.5 MG tablet Take 1 tablet (12.5 mg total) by mouth 2 (two) times daily with a meal.   cetirizine (ZYRTEC ALLERGY) 10 MG tablet Take 1 tablet (10 mg total) by mouth daily.   clopidogrel (PLAVIX) 75 MG tablet TAKE 1 TABLET BY MOUTH EVERY DAY   ezetimibe (ZETIA) 10 MG tablet TAKE 1 TABLET BY MOUTH EVERY DAY   fluticasone (FLONASE) 50 MCG/ACT nasal spray Place 2 sprays into both  nostrils daily as needed for allergies or rhinitis.   furosemide (LASIX) 40 MG tablet Take 1 tablet (40 mg total) by mouth daily.   isosorbide mononitrate (IMDUR) 30 MG 24 hr tablet Take 1 tablet (30 mg total) by mouth daily. (Patient taking differently: Take 30 mg by mouth in the morning and at bedtime.)   levothyroxine (SYNTHROID) 50 MCG tablet Take 1 tablet (50 mcg total) by mouth daily before breakfast. 30 minutes before having breakfast.   montelukast (SINGULAIR) 10 MG tablet TAKE 1 TABLET  EVERY DAY AS NEEDED FOR ALLERGIES   Multiple Vitamin (MULTIVITAMIN WITH MINERALS) TABS tablet Take 1 tablet by mouth daily.   nitroGLYCERIN (NITROSTAT) 0.4 MG SL tablet Place 1 tablet (0.4 mg total) under the tongue every 5 (five) minutes as needed for chest pain.   pantoprazole (PROTONIX) 40 MG tablet Take 1 tablet (40 mg total) by mouth daily.   potassium chloride SA (KLOR-CON M20) 20 MEQ tablet Take 1 tablet (20 mEq total) by mouth daily.   sacubitril-valsartan (ENTRESTO) 24-26 MG Take 1 tablet by mouth 2 (two) times daily.   No facility-administered encounter medications on file as of 11/29/2020.    Follow-up: Return in about 3 months (around 03/01/2021).   Given information on health maintenance and disease prevention as well as colorectal cancer screening sleep apnea and hypothyroidism.  Also asked him to look over obesity hypoventilation syndrome.  Discussed his obesity.  Emphasized the importance of calorie reduction as well as 30 minutes of exercise most days weekly.  He feels like he would be able to walk this distance 5 days weekly.  Discussed taking synthetic thyroid hormone, that will be started if follow-up TSH remains elevated.  [name], MD

## 2020-11-29 NOTE — Addendum Note (Signed)
Addended by: Jon Billings on: 11/29/2020 01:04 PM   Modules accepted: Orders

## 2020-12-01 ENCOUNTER — Telehealth: Payer: Self-pay | Admitting: Family Medicine

## 2020-12-01 NOTE — Telephone Encounter (Signed)
Spoke with patients wife who verbally understood patient should continue with current meds and dosage.

## 2020-12-01 NOTE — Telephone Encounter (Signed)
Pt's wife is calling wanting Dr. Ethelene Hal to know her husband was told to take ezetimibe (ZETIA) 10 MG tablet [id number] and atorvastatin (LIPITOR) 80 MG tablet [[id number]] to lower his cholesterol. He has  been taking these scripts, she wants to know if he should increase the dosage for her husband? Please advise Billy [name] at [phone].

## 2020-12-08 ENCOUNTER — Other Ambulatory Visit: Payer: Self-pay

## 2020-12-08 ENCOUNTER — Telehealth: Payer: Self-pay

## 2020-12-08 ENCOUNTER — Other Ambulatory Visit: Payer: Self-pay | Admitting: *Deleted

## 2020-12-08 MED ORDER — AMIODARONE HCL 200 MG PO TABS: 200.0000 mg | ORAL_TABLET | Freq: Every day | ORAL | 3 refills | Status: DC

## 2020-12-08 NOTE — Telephone Encounter (Signed)
Medication sent to pharmacy. Confirmation received.

## 2020-12-08 NOTE — Telephone Encounter (Signed)
Called pt and pt stated that he takes 1/2 tablet BID. I sent medication into pt's pharmacy as 1 tablet daily as prescribed. Confirmation received.

## 2020-12-09 ENCOUNTER — Encounter: Payer: Self-pay | Admitting: Gastroenterology

## 2020-12-09 ENCOUNTER — Other Ambulatory Visit: Payer: Self-pay

## 2020-12-09 ENCOUNTER — Telehealth: Payer: Self-pay | Admitting: Family Medicine

## 2020-12-09 DIAGNOSIS — E7800 Pure hypercholesterolemia, unspecified: Secondary | ICD-10-CM

## 2020-12-09 MED ORDER — APIXABAN 5 MG PO TABS: 5.0000 mg | ORAL_TABLET | Freq: Two times a day (BID) | ORAL | 1 refills | Status: DC

## 2020-12-09 MED ORDER — EZETIMIBE 10 MG PO TABS
10.0000 mg | ORAL_TABLET | Freq: Every day | ORAL | 1 refills | Status: DC
Start: 2020-12-09 — End: 2021-09-12

## 2020-12-09 NOTE — Telephone Encounter (Signed)
Prescription refill request for Eliquis received. Last office visit:[place] 09/06/20 Scr:1.59 11/29/20 Age: 6mWeight:125.2kg

## 2020-12-09 NOTE — Progress Notes (Signed)
Remote ICD transmission.   

## 2020-12-09 NOTE — Telephone Encounter (Signed)
Pt's wife called in needing Dr. Ethelene Hal to get a pre auth from [facility] to be able to work his referral to [facility]. This is what they have informed her. Please advise Hassan Rowan at [phone].

## 2020-12-10 NOTE — Addendum Note (Signed)
Addended by: Abelino Derrick A on: 12/10/2020 01:11 PM   Modules accepted: Orders

## 2020-12-14 ENCOUNTER — Telehealth: Payer: Self-pay | Admitting: Family Medicine

## 2020-12-14 NOTE — Telephone Encounter (Signed)
[facility] called with pt refill request for levothyroxine. On 8/29 [facility])

## 2020-12-15 NOTE — Telephone Encounter (Signed)
[facility] called patient to see if he would like next prescriptions picked [facility] at [facility] or sent to [facility]. No answer will call back.

## 2021-01-01 ENCOUNTER — Other Ambulatory Visit: Payer: Self-pay | Admitting: Cardiology

## 2021-01-10 DIAGNOSIS — H52209 Unspecified astigmatism, unspecified eye: Secondary | ICD-10-CM | POA: Diagnosis not present

## 2021-01-10 DIAGNOSIS — H5203 Hypermetropia, bilateral: Secondary | ICD-10-CM | POA: Diagnosis not present

## 2021-01-10 DIAGNOSIS — H53029 Refractive amblyopia, unspecified eye: Secondary | ICD-10-CM | POA: Diagnosis not present

## 2021-01-10 DIAGNOSIS — H524 Presbyopia: Secondary | ICD-10-CM | POA: Diagnosis not present

## 2021-01-11 DIAGNOSIS — D170 Benign lipomatous neoplasm of skin and subcutaneous tissue of head, face and neck: Secondary | ICD-10-CM | POA: Diagnosis not present

## 2021-01-11 DIAGNOSIS — I739 Peripheral vascular disease, unspecified: Secondary | ICD-10-CM | POA: Diagnosis not present

## 2021-01-11 DIAGNOSIS — Z955 Presence of coronary angioplasty implant and graft: Secondary | ICD-10-CM | POA: Diagnosis not present

## 2021-01-11 DIAGNOSIS — K219 Gastro-esophageal reflux disease without esophagitis: Secondary | ICD-10-CM | POA: Diagnosis not present

## 2021-01-11 DIAGNOSIS — Z79899 Other long term (current) drug therapy: Secondary | ICD-10-CM | POA: Diagnosis not present

## 2021-01-11 DIAGNOSIS — Z951 Presence of aortocoronary bypass graft: Secondary | ICD-10-CM | POA: Diagnosis not present

## 2021-01-11 DIAGNOSIS — Z95810 Presence of automatic (implantable) cardiac defibrillator: Secondary | ICD-10-CM | POA: Diagnosis not present

## 2021-01-11 DIAGNOSIS — Z8679 Personal history of other diseases of the circulatory system: Secondary | ICD-10-CM | POA: Diagnosis not present

## 2021-01-13 ENCOUNTER — Ambulatory Visit: Payer: Medicare HMO | Admitting: Gastroenterology

## 2021-01-14 ENCOUNTER — Other Ambulatory Visit: Payer: Self-pay

## 2021-01-14 DIAGNOSIS — E039 Hypothyroidism, unspecified: Secondary | ICD-10-CM

## 2021-01-14 MED ORDER — LEVOTHYROXINE SODIUM 50 MCG PO TABS
50.0000 ug | ORAL_TABLET | Freq: Every day | ORAL | 0 refills | Status: DC
Start: 2021-01-14 — End: 2021-06-06

## 2021-02-12 ENCOUNTER — Other Ambulatory Visit: Payer: Self-pay | Admitting: Cardiology

## 2021-02-23 ENCOUNTER — Ambulatory Visit: Payer: Medicare Other

## 2021-02-23 ENCOUNTER — Telehealth: Payer: Self-pay

## 2021-02-23 ENCOUNTER — Ambulatory Visit (INDEPENDENT_AMBULATORY_CARE_PROVIDER_SITE_OTHER): Payer: Medicare HMO

## 2021-02-23 DIAGNOSIS — I4901 Ventricular fibrillation: Secondary | ICD-10-CM | POA: Diagnosis not present

## 2021-02-23 LAB — CUP PACEART REMOTE DEVICE CHECK
Battery Remaining Longevity: 6 mo
Battery Remaining Longevity: 6 mo
Battery Remaining Percentage: 4 %
Battery Remaining Percentage: 4 %
Battery Voltage: 2.62 V
Battery Voltage: 2.62 V
Brady Statistic RV Percent Paced: 1 %
Brady Statistic RV Percent Paced: 1 %
Date Time Interrogation Session: 20221123070334
Date Time Interrogation Session: 20221123070334
HighPow Impedance: 60 Ohm
HighPow Impedance: 60 Ohm
Implantable Lead Implant Date: 20101111
Implantable Lead Implant Date: 20101111
Implantable Lead Location: 753860
Implantable Lead Location: 753860
Implantable Lead Model: 7121
Implantable Lead Model: 7121
Implantable Pulse Generator Implant Date: 20101111
Implantable Pulse Generator Implant Date: 20101111
Lead Channel Impedance Value: 530 Ohm
Lead Channel Impedance Value: 530 Ohm
Lead Channel Pacing Threshold Amplitude: 1 V
Lead Channel Pacing Threshold Amplitude: 1 V
Lead Channel Pacing Threshold Pulse Width: 0.5 ms
Lead Channel Pacing Threshold Pulse Width: 0.5 ms
Lead Channel Sensing Intrinsic Amplitude: 11.7 mV
Lead Channel Sensing Intrinsic Amplitude: 11.7 mV
Lead Channel Setting Pacing Amplitude: 2.5 V
Lead Channel Setting Pacing Amplitude: 2.5 V
Lead Channel Setting Pacing Pulse Width: 0.5 ms
Lead Channel Setting Pacing Pulse Width: 0.5 ms
Lead Channel Setting Sensing Sensitivity: 0.5 mV
Lead Channel Setting Sensing Sensitivity: 0.5 mV
Pulse Gen Serial Number: 743367
Pulse Gen Serial Number: 743367

## 2021-02-23 NOTE — Telephone Encounter (Signed)
Attempted to contact patient about increase in monthly battery checks, No charge. Merlin updated as well.   No answer, LMTCB.

## 2021-03-01 ENCOUNTER — Encounter: Payer: Self-pay | Admitting: Family Medicine

## 2021-03-01 ENCOUNTER — Other Ambulatory Visit: Payer: Self-pay

## 2021-03-01 ENCOUNTER — Ambulatory Visit (INDEPENDENT_AMBULATORY_CARE_PROVIDER_SITE_OTHER): Payer: Medicare HMO | Admitting: Family Medicine

## 2021-03-01 VITALS — BP 126/82 | HR 58 | Temp 97.1°F | Ht 72.0 in | Wt 277.8 lb

## 2021-03-01 DIAGNOSIS — G250 Essential tremor: Secondary | ICD-10-CM | POA: Diagnosis not present

## 2021-03-01 DIAGNOSIS — E039 Hypothyroidism, unspecified: Secondary | ICD-10-CM | POA: Diagnosis not present

## 2021-03-01 DIAGNOSIS — Z23 Encounter for immunization: Secondary | ICD-10-CM | POA: Diagnosis not present

## 2021-03-01 DIAGNOSIS — R0683 Snoring: Secondary | ICD-10-CM

## 2021-03-01 LAB — CBC
HCT: 50.4 % (ref 39.0–52.0)
Hemoglobin: 16.3 g/dL (ref 13.0–17.0)
MCHC: 32.2 g/dL (ref 30.0–36.0)
MCV: 85.5 fl (ref 78.0–100.0)
Platelets: 238 10*3/uL (ref 150.0–400.0)
RBC: 5.9 Mil/uL — ABNORMAL HIGH (ref 4.22–5.81)
RDW: 15.4 % (ref 11.5–15.5)
WBC: 10.3 10*3/uL (ref 4.0–10.5)

## 2021-03-01 LAB — BASIC METABOLIC PANEL
BUN: 16 mg/dL (ref 6–23)
CO2: 32 mEq/L (ref 19–32)
Calcium: 10.1 mg/dL (ref 8.4–10.5)
Chloride: 105 mEq/L (ref 96–112)
Creatinine, Ser: 1.63 mg/dL — ABNORMAL HIGH (ref 0.40–1.50)
GFR: 47.38 mL/min — ABNORMAL LOW (ref >60.00)
Glucose, Bld: 99 mg/dL (ref 70–99)
Potassium: 5.1 mEq/L (ref 3.5–5.1)
Sodium: 142 mEq/L (ref 135–145)

## 2021-03-01 LAB — TSH: TSH: 4.63 u[IU]/mL (ref 0.35–5.50)

## 2021-03-01 NOTE — Progress Notes (Signed)
Established Patient Office Visit  Subjective:  Patient ID: Billy [name], male    DOB: 1966-01-20  Age: 55 y.o. MRN: [id number]  CC:  Chief Complaint  Patient presents with   Follow-up    3 month follow up, patient would like referral to nutritionist also states that his hands tend to tremor sometimes    HPI Billy [name] presents for follow-up of hypothyroidism, health maintenance, obesity and evaluation of a tremor that pends developed since his last visit.  Tremor is in both hands.  It is more common with activity.  Less active with resting.  Gait is not affected.  Continues to walk for exercise and denies any associated chest pain or shortness of breath.  Continues to take his levothyroxine in the morning on a fasting lipid.  Is scheduled for consultation for colonoscopy and sleep study.  We will have flu and pneumonia vaccines today.  He has been taking his atorvastatin and Zetia daily.  Having difficulty losing weight.  Past Medical History:  Diagnosis Date   Aortic atherosclerosis ([place])    on CXR   Blood in stool    C. difficile colitis    a. remote hx 2010.   Cardiac arrest - ventricular fibrillation 02/11/2009   a. 02/2009 s/p St Jude ICD.   Chronic systolic CHF (congestive heart failure) ([name])    Coronary artery disease    a. PCI to Cx age 12. b. CABGx4 in 2003. c. BMS to [place] in 12/2007.   Former tobacco use    Hyperlipidemia    Hypertension    Ischemic cardiomyopathy    Marijuana abuse    Myocardial infarct ([place])    NON-ST-SEGMENT ELEVATION MI   Obesity    PAD (peripheral artery disease) ([place])    a. RLE by noninvasive testing - managed medically for now.   Ventricular tachyarrhythmia    Ventricular tachycardia     Past Surgical History:  Procedure Laterality Date   ABDOMINAL AORTOGRAM W/LOWER EXTREMITY N/A 05/14/2019   Procedure: ABDOMINAL AORTOGRAM W/LOWER EXTREMITY;  Surgeon: Wellington Hampshire, MD;  Location: [place] [facility];  Service: Cardiovascular;   Laterality: N/A;   CARDIAC DEFIBRILLATOR PLACEMENT     STJ single chamber ICD implanted for secondary prevention   CIRCUMCISION     CORONARY ARTERY BYPASS GRAFT  06/17/01   X 4   CORONARY BALLOON ANGIOPLASTY N/A 01/22/2019   Procedure: CORONARY BALLOON ANGIOPLASTY;  Surgeon: Leonie Man, MD;  Location: [place] [facility];  Service: Cardiovascular;  Laterality: N/A;   LEFT HEART CATH AND CORS/GRAFTS ANGIOGRAPHY N/A 01/22/2019   Procedure: LEFT HEART CATH AND CORS/GRAFTS ANGIOGRAPHY;  Surgeon: Leonie Man, MD;  Location: [place] [facility];  Service: Cardiovascular;  Laterality: N/A;   LEFT HEART CATH AND CORS/GRAFTS ANGIOGRAPHY N/A 05/18/2020   Procedure: LEFT HEART CATH AND CORS/GRAFTS ANGIOGRAPHY;  Surgeon: Troy Sine, MD;  Location: [place] [facility];  Service: Cardiovascular;  Laterality: N/A;   PERIPHERAL VASCULAR INTERVENTION Right 05/14/2019   Procedure: PERIPHERAL VASCULAR INTERVENTION;  Surgeon: Wellington Hampshire, MD;  Location: [place] [facility];  Service: Cardiovascular;  Laterality: Right;   SVT ABLATION N/A 06/21/2020   Procedure: SVT ABLATION;  Surgeon: Evans Lance, MD;  Location: [place] [facility];  Service: Cardiovascular;  Laterality: N/A;    Family History  Problem Relation Age of Onset   Hypertension Mother    Heart attack Father        DIED AT 82 FROM MI  Heart disease Sister        CAD AND PREVIOUS CABG   Hypertension Other        IN MOST OF HIS SIBLINGS    Social History   Socioeconomic History   Marital status: Married    Spouse name: Not on file   Number of children: 3   Years of education: 11   Highest education level: Not on file  Occupational History   Occupation: Disability  Tobacco Use   Smoking status: Former    Packs/day: 2.00    Years: 20.00    Pack years: 40.00    Types: Cigarettes    Quit date: 04/04/2003    Years since quitting: 17.9   Smokeless tobacco: Never  Vaping Use   Vaping Use: Never used  Substance and  Sexual Activity   Alcohol use: No   Drug use: No   Sexual activity: Yes    Partners: Female  Other Topics Concern   Not on file  Social History Narrative   MARRIED   FORMER TOBACCO USE, SMOKED FOR 20 YRS 2 PPD; QUIT IN 2005   NO ETOH   NO ILLICIT DRUG USE   NO REGULAR EXERCISE         ICD-ST. JUDE ; CERTIFIED LETTER SENT AND RETURNED BAD ADDRESS, PLS UPDATE DJW.      Fun: Physiological scientist Strain: Not on file  Food Insecurity: Not on file  Transportation Needs: Not on file  Physical Activity: Not on file  Stress: Not on file  Social Connections: Not on file  Intimate Partner Violence: Not on file    Outpatient Medications Prior to Visit  Medication Sig Dispense Refill   acetaminophen (TYLENOL) 500 MG tablet Take 1,000 mg by mouth every 6 (six) hours as needed for moderate pain or headache.     amiodarone (PACERONE) 200 MG tablet Take 1 tablet (200 mg total) by mouth daily. 90 tablet 3   apixaban (ELIQUIS) 5 MG TABS tablet Take 1 tablet (5 mg total) by mouth 2 (two) times daily. 180 tablet 1   atorvastatin (LIPITOR) 80 MG tablet TAKE 1 TABLET EVERY DAY 90 tablet 1   carvedilol (COREG) 12.5 MG tablet Take 1 tablet (12.5 mg total) by mouth 2 (two) times daily with a meal. 180 tablet 3   cetirizine (ZYRTEC ALLERGY) 10 MG tablet Take 1 tablet (10 mg total) by mouth daily. 30 tablet 0   clopidogrel (PLAVIX) 75 MG tablet TAKE 1 TABLET EVERY DAY 90 tablet 1   ENTRESTO 24-26 MG TAKE 1 TABLET BY MOUTH TWICE DAILY 180 tablet 1   ezetimibe (ZETIA) 10 MG tablet Take 1 tablet (10 mg total) by mouth daily. 90 tablet 1   fluticasone (FLONASE) 50 MCG/ACT nasal spray Place 2 sprays into both nostrils daily as needed for allergies or rhinitis. 16 g 11   furosemide (LASIX) 40 MG tablet Take 1 tablet (40 mg total) by mouth daily. 90 tablet 3   isosorbide mononitrate (IMDUR) 30 MG 24 hr tablet Take 1 tablet (30 mg total) by mouth daily. (Patient taking  differently: Take 30 mg by mouth in the morning and at bedtime.) 90 tablet 3   levothyroxine (SYNTHROID) 50 MCG tablet Take 1 tablet (50 mcg total) by mouth daily before breakfast. 30 minutes before having breakfast. 90 tablet 0   montelukast (SINGULAIR) 10 MG tablet TAKE 1 TABLET EVERY DAY AS NEEDED FOR ALLERGIES 90 tablet 1  Multiple Vitamin (MULTIVITAMIN WITH MINERALS) TABS tablet Take 1 tablet by mouth daily.     nitroGLYCERIN (NITROSTAT) 0.4 MG SL tablet DISSOLVE 1 TABLET UNDER THE TONGUE EVERY 5 MINUTES FOR UP TO 3 DOSES AS NEEDED FOR CHEST PAIN 25 tablet 2   pantoprazole (PROTONIX) 40 MG tablet TAKE 1 TABLET EVERY DAY 90 tablet 1   potassium chloride SA (KLOR-CON M20) 20 MEQ tablet Take 1 tablet (20 mEq total) by mouth daily. 90 tablet 3   No facility-administered medications prior to visit.    No Known Allergies  ROS Review of Systems  Constitutional:  Negative for chills, diaphoresis, fatigue, fever and unexpected weight change.  HENT: Negative.    Eyes:  Negative for photophobia and visual disturbance.  Respiratory: Negative.    Cardiovascular: Negative.   Gastrointestinal: Negative.   Endocrine: Negative for polyphagia and polyuria.  Genitourinary: Negative.   Musculoskeletal: Negative.   Neurological:  Positive for tremors and numbness. Negative for weakness.  Psychiatric/Behavioral: Negative.       Objective:    Physical Exam Vitals and nursing note reviewed.  Constitutional:      General: He is not in acute distress.    Appearance: Normal appearance. He is obese. He is not ill-appearing, toxic-appearing or diaphoretic.  HENT:     Head: Normocephalic and atraumatic.     Right Ear: External ear normal.     Left Ear: External ear normal.     Mouth/Throat:     Mouth: Mucous membranes are moist.     Pharynx: Oropharynx is clear. No oropharyngeal exudate or posterior oropharyngeal erythema.  Eyes:     General:        Right eye: No discharge.        Left eye: No  discharge.     Extraocular Movements: Extraocular movements intact.     Conjunctiva/sclera: Conjunctivae normal.     Pupils: Pupils are equal, round, and reactive to light.  Cardiovascular:     Rate and Rhythm: Normal rate and regular rhythm.  Pulmonary:     Effort: Pulmonary effort is normal.     Breath sounds: Normal breath sounds.  Abdominal:     General: Bowel sounds are normal.  Musculoskeletal:     Cervical back: No rigidity or tenderness.     Right lower leg: No edema.     Left lower leg: No edema.  Lymphadenopathy:     Cervical: No cervical adenopathy.  Skin:    General: Skin is warm and dry.  Neurological:     Mental Status: He is alert and oriented to person, place, and time.     Cranial Nerves: No dysarthria or facial asymmetry.     Motor: Tremor (Resting tremor not present.) present.     Coordination: Romberg sign negative. Finger-Nose-Finger Test abnormal (Tremor noted on the right and left finger-nose testing.).  Psychiatric:        Mood and Affect: Mood normal.        Behavior: Behavior normal.    BP 126/82 (BP Location: Right Arm, Patient Position: Sitting, Cuff Size: Large)   Pulse (!) 58   Temp (!) 97.1 F (36.2 C) (Temporal)   Ht 6' (1.829 m)   Wt 277 lb 12.8 oz (126 kg)   SpO2 94%   BMI 37.68 kg/m  Wt Readings from Last 3 Encounters:  03/01/21 277 lb 12.8 oz (126 kg)  11/29/20 276 lb (125.2 kg)  09/06/20 268 lb 3.2 oz (121.7 kg)     Health  Maintenance Due  Topic Date Due   Pneumococcal Vaccine 80-69 Years old (1 - PCV) Never done   Zoster Vaccines- Shingrix (1 of 2) Never done   COLONOSCOPY (Pts 45-13yr Insurance coverage will need to be confirmed)  Never done   INFLUENZA VACCINE  11/01/2020    There are no preventive care reminders to display for this patient.  Lab Results  Component Value Date   TSH 5.71 (H) 11/29/2020   Lab Results  Component Value Date   WBC 9.3 11/29/2020   HGB 16.8 11/29/2020   HCT 51.0 11/29/2020   MCV 85.2  11/29/2020   PLT 265.0 11/29/2020   Lab Results  Component Value Date   NA 140 11/29/2020   K 4.6 11/29/2020   CO2 28 11/29/2020   GLUCOSE 85 11/29/2020   BUN 20 11/29/2020   CREATININE 1.59 (H) 11/29/2020   BILITOT 0.9 11/29/2020   ALKPHOS 92 11/29/2020   AST 27 11/29/2020   ALT 38 11/29/2020   PROT 7.7 11/29/2020   ALBUMIN 4.4 11/29/2020   CALCIUM 10.2 11/29/2020   ANIONGAP 10 05/18/2020   EGFR 67 07/15/2020   GFR 48.90 (L) 11/29/2020   Lab Results  Component Value Date   CHOL 153 11/29/2020   Lab Results  Component Value Date   HDL 43.40 11/29/2020   Lab Results  Component Value Date   LDLCALC 85 11/29/2020   Lab Results  Component Value Date   TRIG 122.0 11/29/2020   Lab Results  Component Value Date   CHOLHDL 4 11/29/2020   Lab Results  Component Value Date   HGBA1C 6.3 11/29/2020      Assessment & Plan:   Problem List Items Addressed This Visit       Endocrine   Hypothyroidism - Primary   Relevant Orders   Basic metabolic panel   CBC   TSH     Nervous and Auditory   Tremor, essential     Other   Morbid obesity ([place]   Relevant Orders   Amb ref to Medical Nutrition Therapy-MNT   Flu vaccine need   Relevant Orders   Flu Vaccine QUAD 6+ mos PF IM (Fluarix Quad PF)   Need for vaccination against Streptococcus pneumoniae   Relevant Orders   Pneumococcal conjugate vaccine 20-valent (Prevnar 20)    No orders of the defined types were placed in this encounter.   Follow-up: Return in about 6 months (around 08/29/2021), or if symptoms worsen or fail to improve.  We will adjust levothyroxine pending TSH levels.  Information was given on tremors.  Agrees to go for nutritional counseling.   WLibby Maw MD

## 2021-03-02 ENCOUNTER — Other Ambulatory Visit: Payer: Self-pay | Admitting: *Deleted

## 2021-03-02 DIAGNOSIS — Z79899 Other long term (current) drug therapy: Secondary | ICD-10-CM

## 2021-03-07 ENCOUNTER — Telehealth: Payer: Self-pay | Admitting: *Deleted

## 2021-03-07 NOTE — Progress Notes (Signed)
Remote ICD transmission.   

## 2021-03-07 NOTE — Telephone Encounter (Signed)
MyChart Message sent to patient regarding monthly battery check with direct number to device clinic given for any questions.

## 2021-03-07 NOTE — Telephone Encounter (Signed)
Spoke with patient and aware of results with recommendations.Labs r/s 03-29-21

## 2021-03-08 ENCOUNTER — Ambulatory Visit: Payer: Medicare HMO | Admitting: Gastroenterology

## 2021-03-09 NOTE — Progress Notes (Signed)
Monthly battery check already scheduled.

## 2021-03-29 ENCOUNTER — Other Ambulatory Visit: Payer: Medicare HMO | Admitting: *Deleted

## 2021-03-29 ENCOUNTER — Ambulatory Visit (INDEPENDENT_AMBULATORY_CARE_PROVIDER_SITE_OTHER): Payer: Medicare HMO

## 2021-03-29 ENCOUNTER — Other Ambulatory Visit: Payer: Self-pay

## 2021-03-29 DIAGNOSIS — Z79899 Other long term (current) drug therapy: Secondary | ICD-10-CM | POA: Diagnosis not present

## 2021-03-29 DIAGNOSIS — I4901 Ventricular fibrillation: Secondary | ICD-10-CM

## 2021-03-29 LAB — CUP PACEART REMOTE DEVICE CHECK
Battery Remaining Longevity: 5 mo
Battery Remaining Percentage: 3 %
Battery Voltage: 2.62 V
Brady Statistic RV Percent Paced: 1 %
Date Time Interrogation Session: 20221227031512
HighPow Impedance: 54 Ohm
Implantable Lead Implant Date: 20101111
Implantable Lead Location: 753860
Implantable Lead Model: 7121
Implantable Pulse Generator Implant Date: 20101111
Lead Channel Impedance Value: 480 Ohm
Lead Channel Pacing Threshold Amplitude: 1 V
Lead Channel Pacing Threshold Pulse Width: 0.5 ms
Lead Channel Sensing Intrinsic Amplitude: 11.7 mV
Lead Channel Setting Pacing Amplitude: 2.5 V
Lead Channel Setting Pacing Pulse Width: 0.5 ms
Lead Channel Setting Sensing Sensitivity: 0.5 mV
Pulse Gen Serial Number: 743367

## 2021-03-29 LAB — BASIC METABOLIC PANEL
BUN/Creatinine Ratio: 12 (ref 9–20)
BUN: 16 mg/dL (ref 6–24)
CO2: 24 mmol/L (ref 20–29)
Calcium: 9.1 mg/dL (ref 8.7–10.2)
Chloride: 107 mmol/L — ABNORMAL HIGH (ref 96–106)
Creatinine, Ser: 1.35 mg/dL — ABNORMAL HIGH (ref 0.76–1.27)
Glucose: 101 mg/dL — ABNORMAL HIGH (ref 70–99)
Potassium: 4.2 mmol/L (ref 3.5–5.2)
Sodium: 143 mmol/L (ref 134–144)
eGFR: 62 mL/min/{1.73_m2} (ref >59)

## 2021-04-08 NOTE — Progress Notes (Signed)
Remote ICD transmission.   

## 2021-04-13 ENCOUNTER — Ambulatory Visit (INDEPENDENT_AMBULATORY_CARE_PROVIDER_SITE_OTHER): Payer: Medicare HMO

## 2021-04-13 DIAGNOSIS — Z0000 Encounter for general adult medical examination without abnormal findings: Secondary | ICD-10-CM

## 2021-04-13 NOTE — Progress Notes (Signed)
Subjective:   Billy [name] is a 56 y.o. male who presents for an Initial Medicare Annual Wellness Visit.   I connected with Eldredge Rosita Fire today by telephone and verified that I am speaking with the correct person using two identifiers. Location patient: home Location provider: work Persons participating in the virtual visit: patient, provider.   I discussed the limitations, risks, security and privacy concerns of performing an evaluation and management service by telephone and the availability of in person appointments. I also discussed with the patient that there may be a patient responsible charge related to this service. The patient expressed understanding and verbally consented to this telephonic visit.    Interactive audio and video telecommunications were attempted between this provider and patient, however failed, due to patient having technical difficulties OR patient did not have access to video capability.  We continued and completed visit with audio only.    Review of Systems     Cardiac Risk Factors include: advanced age (>62men, >17 women);dyslipidemia;male gender;hypertension     Objective:    Today's Vitals   There is no height or weight on file to calculate BMI.  Advanced Directives 04/13/2021 06/21/2020 05/18/2020 05/14/2019 02/15/2019 02/15/2019 01/22/2019  Does Patient Have a Medical Advance Directive? Yes No No No No No No  Does patient want to make changes to medical advance directive? No - Patient declined - - - - - -  Would patient like information on creating a medical advance directive? - No - Patient declined No - Patient declined No - Patient declined No - Patient declined - No - Patient declined    Current Medications (verified) Outpatient Encounter Medications as of 04/13/2021  Medication Sig   acetaminophen (TYLENOL) 500 MG tablet Take 1,000 mg by mouth every 6 (six) hours as needed for moderate pain or headache.   amiodarone (PACERONE) 200 MG tablet  Take 1 tablet (200 mg total) by mouth daily.   apixaban (ELIQUIS) 5 MG TABS tablet Take 1 tablet (5 mg total) by mouth 2 (two) times daily.   atorvastatin (LIPITOR) 80 MG tablet TAKE 1 TABLET EVERY DAY   carvedilol (COREG) 12.5 MG tablet Take 1 tablet (12.5 mg total) by mouth 2 (two) times daily with a meal.   cetirizine (ZYRTEC ALLERGY) 10 MG tablet Take 1 tablet (10 mg total) by mouth daily.   clopidogrel (PLAVIX) 75 MG tablet TAKE 1 TABLET EVERY DAY   ENTRESTO 24-26 MG TAKE 1 TABLET BY MOUTH TWICE DAILY   ezetimibe (ZETIA) 10 MG tablet Take 1 tablet (10 mg total) by mouth daily.   fluticasone (FLONASE) 50 MCG/ACT nasal spray Place 2 sprays into both nostrils daily as needed for allergies or rhinitis.   furosemide (LASIX) 40 MG tablet Take 1 tablet (40 mg total) by mouth daily.   isosorbide mononitrate (IMDUR) 30 MG 24 hr tablet Take 1 tablet (30 mg total) by mouth daily. (Patient taking differently: Take 30 mg by mouth in the morning and at bedtime.)   levothyroxine (SYNTHROID) 50 MCG tablet Take 1 tablet (50 mcg total) by mouth daily before breakfast. 30 minutes before having breakfast.   montelukast (SINGULAIR) 10 MG tablet TAKE 1 TABLET EVERY DAY AS NEEDED FOR ALLERGIES   Multiple Vitamin (MULTIVITAMIN WITH MINERALS) TABS tablet Take 1 tablet by mouth daily.   nitroGLYCERIN (NITROSTAT) 0.4 MG SL tablet DISSOLVE 1 TABLET UNDER THE TONGUE EVERY 5 MINUTES FOR UP TO 3 DOSES AS NEEDED FOR CHEST PAIN   pantoprazole (PROTONIX) 40 MG tablet  TAKE 1 TABLET EVERY DAY   potassium chloride SA (KLOR-CON M20) 20 MEQ tablet Take 1 tablet (20 mEq total) by mouth daily.   No facility-administered encounter medications on file as of 04/13/2021.    Allergies (verified) Patient has no known allergies.   History: Past Medical History:  Diagnosis Date   Aortic atherosclerosis ([place])    on CXR   Blood in stool    C. difficile colitis    a. remote hx 2010.   Cardiac arrest - ventricular fibrillation  02/11/2009   a. 02/2009 s/p St Jude ICD.   Chronic systolic CHF (congestive heart failure) ([name])    Coronary artery disease    a. PCI to Cx age 43. b. CABGx4 in 2003. c. BMS to [place] in 12/2007.   Former tobacco use    Hyperlipidemia    Hypertension    Ischemic cardiomyopathy    Marijuana abuse    Myocardial infarct ([place])    NON-ST-SEGMENT ELEVATION MI   Obesity    PAD (peripheral artery disease) ([place])    a. RLE by noninvasive testing - managed medically for now.   Ventricular tachyarrhythmia    Ventricular tachycardia    Past Surgical History:  Procedure Laterality Date   ABDOMINAL AORTOGRAM W/LOWER EXTREMITY N/A 05/14/2019   Procedure: ABDOMINAL AORTOGRAM W/LOWER EXTREMITY;  Surgeon: Wellington Hampshire, MD;  Location: [facility];  Service: Cardiovascular;  Laterality: N/A;   CARDIAC DEFIBRILLATOR PLACEMENT     STJ single chamber ICD implanted for secondary prevention   CIRCUMCISION     CORONARY ARTERY BYPASS GRAFT  06/17/01   X 4   CORONARY BALLOON ANGIOPLASTY N/A 01/22/2019   Procedure: CORONARY BALLOON ANGIOPLASTY;  Surgeon: Leonie Man, MD;  Location: [facility];  Service: Cardiovascular;  Laterality: N/A;   LEFT HEART CATH AND CORS/GRAFTS ANGIOGRAPHY N/A 01/22/2019   Procedure: LEFT HEART CATH AND CORS/GRAFTS ANGIOGRAPHY;  Surgeon: Leonie Man, MD;  Location: [facility];  Service: Cardiovascular;  Laterality: N/A;   LEFT HEART CATH AND CORS/GRAFTS ANGIOGRAPHY N/A 05/18/2020   Procedure: LEFT HEART CATH AND CORS/GRAFTS ANGIOGRAPHY;  Surgeon: Troy Sine, MD;  Location: [facility];  Service: Cardiovascular;  Laterality: N/A;   PERIPHERAL VASCULAR INTERVENTION Right 05/14/2019   Procedure: PERIPHERAL VASCULAR INTERVENTION;  Surgeon: Wellington Hampshire, MD;  Location: [facility];  Service: Cardiovascular;  Laterality: Right;   SVT ABLATION N/A 06/21/2020   Procedure: SVT ABLATION;  Surgeon: Evans Lance, MD;  Location: [facility];  Service: Cardiovascular;  Laterality: N/A;   Family History  Problem Relation Age of Onset   Hypertension Mother    Heart attack Father        DIED AT 47 FROM MI   Heart disease Sister        CAD AND PREVIOUS CABG   Hypertension Other        IN MOST OF HIS SIBLINGS   Social History   Socioeconomic History   Marital status: Married    Spouse name: Not on file   Number of children: 3   Years of education: 11   Highest education level: Not on file  Occupational History   Occupation: Disability  Tobacco Use   Smoking status: Former    Packs/day: 2.00    Years: 20.00    Pack years: 40.00    Types: Cigarettes    Quit date: 04/04/2003    Years since quitting: 12.0  Smokeless tobacco: Never  Vaping Use   Vaping Use: Never used  Substance and Sexual Activity   Alcohol use: No   Drug use: No   Sexual activity: Yes    Partners: Female  Other Topics Concern   Not on file  Social History Narrative   MARRIED   FORMER TOBACCO USE, SMOKED FOR 20 YRS 2 PPD; QUIT IN 2005   NO ETOH   NO ILLICIT DRUG USE   NO REGULAR EXERCISE         ICD-ST. JUDE ; CERTIFIED LETTER SENT AND RETURNED BAD ADDRESS, PLS UPDATE DJW.      Fun: Physiological scientist Strain: Low Risk    Difficulty of Paying Living Expenses: Not hard at all  Food Insecurity: No Food Insecurity   Worried About Charity fundraiser in the Last Year: Never true   Arboriculturist in the Last Year: Never true  Transportation Needs: No Transportation Needs   Lack of Transportation (Medical): No   Lack of Transportation (Non-Medical): No  Physical Activity: Insufficiently Active   Days of Exercise per Week: 5 days   Minutes of Exercise per Session: 20 min  Stress: No Stress Concern Present   Feeling of Stress : Not at all  Social Connections: Moderately Integrated   Frequency of Communication with Friends and Family: Twice a week   Frequency of Social Gatherings with  Friends and Family: Twice a week   Attends Religious Services: 1 to 4 times per year   Active Member of [employer] or Organizations: No   Attends Music therapist: Never   Marital Status: Married    Tobacco Counseling Counseling given: Not Answered   Clinical Intake:  Pre-visit preparation completed: Yes  Pain : No/denies pain     Nutritional Risks: None Diabetes: No  How often do you need to have someone help you when you read instructions, pamphlets, or other written materials from your doctor or pharmacy?: 1 - Never What is the last grade level you completed in school?: 11th grade  Diabetic?no   Interpreter Needed?: No  Information entered by :: L.Blimi Godby,LPN   Activities of Daily Living In your present state of health, do you have any difficulty performing the following activities: 04/13/2021  Hearing? N  Vision? N  Difficulty concentrating or making decisions? N  Walking or climbing stairs? N  Dressing or bathing? N  Doing errands, shopping? N  Preparing Food and eating ? N  Using the Toilet? N  In the past six months, have you accidently leaked urine? N  Do you have problems with loss of bowel control? N  Managing your Medications? N  Managing your Finances? N  Housekeeping or managing your Housekeeping? N  Some recent data might be hidden    Patient Care Team: [name], MD as PCP - General (Family Medicine) Stanford Breed Denice Bors, MD as PCP - Cardiology (Cardiology) Deboraha Sprang, MD as PCP - Electrophysiology (Cardiology) Lavonna Monarch, MD as Consulting Physician (Dermatology)  Indicate any recent Medical Services you may have received from other than [facility] providers in the past year (date may be approximate).     Assessment:   This is a routine wellness examination for Billy [name].  Hearing/Vision screen Vision Screening - Comments:: Annual eye exams wears glasses   Dietary issues and exercise activities discussed: Current Exercise  Habits: Home exercise routine, Type of exercise: walking, Time (Minutes): 30, Frequency (Times/Week):  5, Weekly Exercise (Minutes/Week): 150, Intensity: Mild, Exercise limited by: cardiac condition(s)   Goals Addressed   None    Depression Screen PHQ 2/9 Scores 04/13/2021 04/13/2021 03/01/2021 11/29/2020 04/12/2020 06/11/2019 08/23/2017  PHQ - 2 Score 0 0 0 0 0 0 0  PHQ- 9 Score - - - - - - 0    Fall Risk Fall Risk  04/13/2021 03/01/2021 11/29/2020 04/12/2020 08/23/2017  Falls in the past year? 0 0 0 0 No  Number falls in past yr: 0 0 - 0 -  Injury with Fall? 0 - - 0 -  Risk for fall due to : - - - No Fall Risks -  Follow up Falls evaluation completed - - Falls evaluation completed -    FALL RISK PREVENTION PERTAINING TO THE HOME:  Any stairs in or around the home? No  If so, are there any without handrails? No  Home free of loose throw rugs in walkways, pet beds, electrical cords, etc? Yes  Adequate lighting in your home to reduce risk of falls? Yes   ASSISTIVE DEVICES UTILIZED TO PREVENT FALLS:  Life alert? No  Use of a cane, walker or w/c? No  Grab bars in the bathroom? No  Shower chair or bench in shower? No  Elevated toilet seat or a handicapped toilet? No   Cognitive Function:    Normal cognitive status assessed by direct observation by this Nurse Health Advisor. No abnormalities found.      Immunizations Immunization History  Administered Date(s) Administered   Influenza,inj,Quad PF,6+ Mos 12/10/2013, 03/01/2021   Moderna Sars-Covid-2 Vaccination 10/11/2019, 11/08/2019   PNEUMOCOCCAL CONJUGATE-20 03/01/2021   Tdap 11/02/2014    TDAP status: Up to date  Flu Vaccine status: Up to date  Pneumococcal vaccine status: Up to date  Covid-19 vaccine status: Completed vaccines  Qualifies for Shingles Vaccine? Yes   Zostavax completed No   Shingrix Completed?: No.    Education has been provided regarding the importance of this vaccine. Patient has been advised to call  insurance company to determine out of pocket expense if they have not yet received this vaccine. Advised may also receive vaccine at local pharmacy or Health Dept. Verbalized acceptance and understanding.  Screening Tests Health Maintenance  Topic Date Due   Zoster Vaccines- Shingrix (1 of 2) Never done   COLONOSCOPY (Pts 45-36yrs Insurance coverage will need to be confirmed)  Never done   TETANUS/TDAP  11/01/2024   Pneumococcal Vaccine 53-28 Years old  Completed   INFLUENZA VACCINE  Completed   Hepatitis C Screening  Completed   HIV Screening  Completed   HPV VACCINES  Aged Out   COVID-19 Vaccine  Discontinued    Health Maintenance  Health Maintenance Due  Topic Date Due   Zoster Vaccines- Shingrix (1 of 2) Never done   COLONOSCOPY (Pts 45-53yrs Insurance coverage will need to be confirmed)  Never done    Colorectal cancer screening: Referral to GI placed scheduled 04/18/2021. Pt aware the office will call re: appt.  Lung Cancer Screening: (Low Dose CT Chest recommended if Age 36-80 years, 30 pack-year currently smoking OR have quit w/in 15years.) does not qualify.   Lung Cancer Screening Referral: n/a  Additional Screening:  Hepatitis C Screening: does not qualify; Completed 10/09/2019  Vision Screening: Recommended annual ophthalmology exams for early detection of glaucoma and other disorders of the eye. Is the patient up to date with their annual eye exam?  Yes  Who is the provider or what is  the name of the office in which the patient attends annual eye exams? [facility]  If pt is not established with a provider, would they like to be referred to a provider to establish care? No .   Dental Screening: Recommended annual dental exams for proper oral hygiene  Community Resource Referral / Chronic Care Management: CRR required this visit?  No   CCM required this visit?  No      Plan:     I have personally reviewed and noted the following in the patients chart:    Medical and social history Use of alcohol, tobacco or illicit drugs  Current medications and supplements including opioid prescriptions. Patient is not currently taking opioid prescriptions. Functional ability and status Nutritional status Physical activity Advanced directives List of other physicians Hospitalizations, surgeries, and ER visits in previous 12 months Vitals Screenings to include cognitive, depression, and falls Referrals and appointments  In addition, I have reviewed and discussed with patient certain preventive protocols, quality metrics, and best practice recommendations. A written personalized care plan for preventive services as well as general preventive health recommendations were provided to patient.     Randel Pigg, LPN   07/05/7094   Nurse Notes: none

## 2021-04-13 NOTE — Patient Instructions (Signed)
[name] , Thank you for taking time to come for your Medicare Wellness Visit. I appreciate your ongoing commitment to your health goals. Please review the following plan we discussed and let me know if I can assist you in the future.   Screening recommendations/referrals: Colonoscopy: schedule 04/18/2021 Recommended yearly ophthalmology/optometry visit for glaucoma screening and checkup Recommended yearly dental visit for hygiene and checkup  Vaccinations: Influenza vaccine: completed  Pneumococcal vaccine: completed  Tdap vaccine: 11/02/2014 Shingles vaccine: will consider     Advanced directives: none   Conditions/risks identified: none   Next appointment: none  Preventive Care 40-64 Years, Male Preventive care refers to lifestyle choices and visits with your health care provider that can promote health and wellness. What does preventive care include? A yearly physical exam. This is also called an annual well check. Dental exams once or twice a year. Routine eye exams. Ask your health care provider how often you should have your eyes checked. Personal lifestyle choices, including: Daily care of your teeth and gums. Regular physical activity. Eating a healthy diet. Avoiding tobacco and drug use. Limiting alcohol use. Practicing safe sex. Taking low-dose aspirin every day starting at age 71. What happens during an annual well check? The services and screenings done by your health care provider during your annual well check will depend on your age, overall health, lifestyle risk factors, and family history of disease. Counseling  Your health care provider may ask you questions about your: Alcohol use. Tobacco use. Drug use. Emotional well-being. Home and relationship well-being. Sexual activity. Eating habits. Work and work Statistician. Screening  You may have the following tests or measurements: Height, weight, and BMI. Blood pressure. Lipid and cholesterol  levels. These may be checked every 5 years, or more frequently if you are over 36 years old. Skin check. Lung cancer screening. You may have this screening every year starting at age 64 if you have a 30-pack-year history of smoking and currently smoke or have quit within the past 15 years. Fecal occult blood test (FOBT) of the stool. You may have this test every year starting at age 39. Flexible sigmoidoscopy or colonoscopy. You may have a sigmoidoscopy every 5 years or a colonoscopy every 10 years starting at age 32. Prostate cancer screening. Recommendations will vary depending on your family history and other risks. Hepatitis C blood test. Hepatitis B blood test. Sexually transmitted disease (STD) testing. Diabetes screening. This is done by checking your blood sugar (glucose) after you have not eaten for a while (fasting). You may have this done every 1-3 years. Discuss your test results, treatment options, and if necessary, the need for more tests with your health care provider. Vaccines  Your health care provider may recommend certain vaccines, such as: Influenza vaccine. This is recommended every year. Tetanus, diphtheria, and acellular pertussis (Tdap, Td) vaccine. You may need a Td booster every 10 years. Zoster vaccine. You may need this after age 17. Pneumococcal 13-valent conjugate (PCV13) vaccine. You may need this if you have certain conditions and have not been vaccinated. Pneumococcal polysaccharide (PPSV23) vaccine. You may need one or two doses if you smoke cigarettes or if you have certain conditions. Talk to your health care provider about which screenings and vaccines you need and how often you need them. This information is not intended to replace advice given to you by your health care provider. Make sure you discuss any questions you have with your health care provider. Document Released: 04/16/2015 Document Revised: 12/08/2015  Document Reviewed: 01/19/2015 Elsevier  Interactive Patient Education  2017 [employer] Prevention in the Home Falls can cause injuries. They can happen to people of all ages. There are many things you can do to make your home safe and to help prevent falls. What can I do on the outside of my home? Regularly fix the edges of walkways and driveways and fix any cracks. Remove anything that might make you trip as you walk through a door, such as a raised step or threshold. Trim any bushes or trees on the path to your home. Use bright outdoor lighting. Clear any walking paths of anything that might make someone trip, such as rocks or tools. Regularly check to see if handrails are loose or broken. Make sure that both sides of any steps have handrails. Any raised decks and porches should have guardrails on the edges. Have any leaves, snow, or ice cleared regularly. Use sand or salt on walking paths during winter. Clean up any spills in your garage right away. This includes oil or grease spills. What can I do in the bathroom? Use night lights. Install grab bars by the toilet and in the tub and shower. Do not use towel bars as grab bars. Use non-skid mats or decals in the tub or shower. If you need to sit down in the shower, use a plastic, non-slip stool. Keep the floor dry. Clean up any water that spills on the floor as soon as it happens. Remove soap buildup in the tub or shower regularly. Attach bath mats securely with double-sided non-slip rug tape. Do not have throw rugs and other things on the floor that can make you trip. What can I do in the bedroom? Use night lights. Make sure that you have a light by your bed that is easy to reach. Do not use any sheets or blankets that are too big for your bed. They should not hang down onto the floor. Have a firm chair that has side arms. You can use this for support while you get dressed. Do not have throw rugs and other things on the floor that can make you trip. What can I do  in the kitchen? Clean up any spills right away. Avoid walking on wet floors. Keep items that you use a lot in easy-to-reach places. If you need to reach something above you, use a strong step stool that has a grab bar. Keep electrical cords out of the way. Do not use floor polish or wax that makes floors slippery. If you must use wax, use non-skid floor wax. Do not have throw rugs and other things on the floor that can make you trip. What can I do with my stairs? Do not leave any items on the stairs. Make sure that there are handrails on both sides of the stairs and use them. Fix handrails that are broken or loose. Make sure that handrails are as long as the stairways. Check any carpeting to make sure that it is firmly attached to the stairs. Fix any carpet that is loose or worn. Avoid having throw rugs at the top or bottom of the stairs. If you do have throw rugs, attach them to the floor with carpet tape. Make sure that you have a light switch at the top of the stairs and the bottom of the stairs. If you do not have them, ask someone to add them for you. What else can I do to help prevent falls? Wear shoes that:  Do not have high heels. Have rubber bottoms. Are comfortable and fit you well. Are closed at the toe. Do not wear sandals. If you use a stepladder: Make sure that it is fully opened. Do not climb a closed stepladder. Make sure that both sides of the stepladder are locked into place. Ask someone to hold it for you, if possible. Clearly mark and make sure that you can see: Any grab bars or handrails. First and last steps. Where the edge of each step is. Use tools that help you move around (mobility aids) if they are needed. These include: Canes. Walkers. Scooters. Crutches. Turn on the lights when you go into a dark area. Replace any light bulbs as soon as they burn out. Set up your furniture so you have a clear path. Avoid moving your furniture around. If any of your  floors are uneven, fix them. If there are any pets around you, be aware of where they are. Review your medicines with your doctor. Some medicines can make you feel dizzy. This can increase your chance of falling. Ask your doctor what other things that you can do to help prevent falls. This information is not intended to replace advice given to you by your health care provider. Make sure you discuss any questions you have with your health care provider. Document Released: 01/14/2009 Document Revised: 08/26/2015 Document Reviewed: 04/24/2014 Elsevier Interactive Patient Education  2017 [employer].

## 2021-04-20 ENCOUNTER — Telehealth: Payer: Self-pay | Admitting: Gastroenterology

## 2021-04-20 ENCOUNTER — Encounter: Payer: Self-pay | Admitting: Gastroenterology

## 2021-04-20 ENCOUNTER — Ambulatory Visit (INDEPENDENT_AMBULATORY_CARE_PROVIDER_SITE_OTHER): Payer: Medicare HMO | Admitting: Gastroenterology

## 2021-04-20 VITALS — BP 98/68 | HR 68 | Ht 70.0 in | Wt 277.0 lb

## 2021-04-20 DIAGNOSIS — Z1211 Encounter for screening for malignant neoplasm of colon: Secondary | ICD-10-CM

## 2021-04-20 DIAGNOSIS — Z1212 Encounter for screening for malignant neoplasm of rectum: Secondary | ICD-10-CM

## 2021-04-20 DIAGNOSIS — Z7902 Long term (current) use of antithrombotics/antiplatelets: Secondary | ICD-10-CM

## 2021-04-20 DIAGNOSIS — Z7901 Long term (current) use of anticoagulants: Secondary | ICD-10-CM

## 2021-04-20 NOTE — Telephone Encounter (Signed)
Patient is to have Cologuard. Noted that it is covered by insurance.

## 2021-04-20 NOTE — Telephone Encounter (Signed)
Patients insurance called stating that he was covered for screening for colorectal cancer.

## 2021-04-20 NOTE — Patient Instructions (Signed)
Your provider has ordered Cologuard testing as an option for colon cancer screening. This is performed by [employer] and may be out of network with your insurance. PRIOR to completing the test, it is YOUR responsibility to contact your insurance about covered benefits for this test. Your out of pocket expense could be anywhere from $0.00 to $649.00.   When you call to check coverage with your insurer, please provide the following information:   -The ONLY provider of Cologuard is [place] code for Cologuard is [phone].  [employer] NPI # [id number]  -[employer] Tax ID # [id number]   We have already sent your demographic and insurance information to [employer] (phone number [phone]) and they should contact you within the next week regarding your test. If you have not heard from them within the next week, please call our office at [phone].   Due to recent changes in healthcare laws, you may see the results of your imaging and laboratory studies on MyChart before your provider has had a chance to review them.  We understand that in some cases there may be results that are confusing or concerning to you. Not all laboratory results come back in the same time frame and the provider may be waiting for multiple results in order to interpret others.  Please give [place] 48 hours in order for your provider to thoroughly review all the results before contacting the office for clarification of your results.   The [facility] providers would like to encourage you to use [facility] to communicate with providers for non-urgent requests or questions.  Due to long hold times on the telephone, sending your provider a message by [facility] may be a faster and more efficient way to get a response.  Please allow 48 business hours for a response.  Please remember that this is for non-urgent requests.   Thank you for choosing me and [facility].  Pricilla Riffle. Dagoberto Ligas., MD., Marval Regal

## 2021-04-20 NOTE — Progress Notes (Signed)
History of Present Illness: This is a 56 year old male referred by [name],* MD for [facility] screening, average risk.  He is accompanied by his wife.  History of CAD, status post CABG remotely and status post PCI recently, history of VF arrest in 2010, history of SVT, ischemic cardiomyopathy, chronic systolic heart failure, status post ICD, history of VT on amiodarone.  He is maintained on Plavix and Eliquis.  He has GERD that is well controlled on daily pantoprazole.  No other gastrointestinal complaints.  He is referred for CRC screening.  He relates he had a colonoscopy over 10 years ago however those records are not available to me today.  Denies weight loss, abdominal pain, constipation, diarrhea, change in stool caliber, melena, hematochezia, nausea, vomiting, dysphagia, chest pain.    No Known Allergies Outpatient Medications Prior to Visit  Medication Sig Dispense Refill   acetaminophen (TYLENOL) 500 MG tablet Take 1,000 mg by mouth every 6 (six) hours as needed for moderate pain or headache.     amiodarone (PACERONE) 200 MG tablet Take 1 tablet (200 mg total) by mouth daily. 90 tablet 3   apixaban (ELIQUIS) 5 MG TABS tablet Take 1 tablet (5 mg total) by mouth 2 (two) times daily. 180 tablet 1   atorvastatin (LIPITOR) 80 MG tablet TAKE 1 TABLET EVERY DAY 90 tablet 1   carvedilol (COREG) 12.5 MG tablet Take 1 tablet (12.5 mg total) by mouth 2 (two) times daily with a meal. 180 tablet 3   cetirizine (ZYRTEC ALLERGY) 10 MG tablet Take 1 tablet (10 mg total) by mouth daily. 30 tablet 0   clopidogrel (PLAVIX) 75 MG tablet TAKE 1 TABLET EVERY DAY 90 tablet 1   ENTRESTO 24-26 MG TAKE 1 TABLET BY MOUTH TWICE DAILY 180 tablet 1   ezetimibe (ZETIA) 10 MG tablet Take 1 tablet (10 mg total) by mouth daily. 90 tablet 1   fluticasone (FLONASE) 50 MCG/ACT nasal spray Place 2 sprays into both nostrils daily as needed for allergies or rhinitis. 16 g 11   furosemide (LASIX) 40 MG tablet Take 1  tablet (40 mg total) by mouth daily. 90 tablet 3   isosorbide mononitrate (IMDUR) 30 MG 24 hr tablet Take 1 tablet (30 mg total) by mouth daily. (Patient taking differently: Take 30 mg by mouth in the morning and at bedtime.) 90 tablet 3   levothyroxine (SYNTHROID) 50 MCG tablet Take 1 tablet (50 mcg total) by mouth daily before breakfast. 30 minutes before having breakfast. 90 tablet 0   montelukast (SINGULAIR) 10 MG tablet TAKE 1 TABLET EVERY DAY AS NEEDED FOR ALLERGIES 90 tablet 1   Multiple Vitamin (MULTIVITAMIN WITH MINERALS) TABS tablet Take 1 tablet by mouth daily.     nitroGLYCERIN (NITROSTAT) 0.4 MG SL tablet DISSOLVE 1 TABLET UNDER THE TONGUE EVERY 5 MINUTES FOR UP TO 3 DOSES AS NEEDED FOR CHEST PAIN 25 tablet 2   pantoprazole (PROTONIX) 40 MG tablet TAKE 1 TABLET EVERY DAY 90 tablet 1   potassium chloride SA (KLOR-CON M20) 20 MEQ tablet Take 1 tablet (20 mEq total) by mouth daily. 90 tablet 3   No facility-administered medications prior to visit.   Past Medical History:  Diagnosis Date   Aortic atherosclerosis ([place])    on CXR   Blood in stool    C. difficile colitis    a. remote hx 2010.   Cardiac arrest - ventricular fibrillation 02/11/2009   a. 02/2009 s/p [employer] Jude ICD.  Chronic systolic CHF (congestive heart failure) ([name])    Coronary artery disease    a. PCI to Cx age 56. b. CABGx4 in 2003. c. BMS to [place] in 12/2007.   Former tobacco use    Hyperlipidemia    Hypertension    Ischemic cardiomyopathy    Marijuana abuse    Myocardial infarct ([place])    NON-ST-SEGMENT ELEVATION MI   Obesity    PAD (peripheral artery disease) ([place])    a. RLE by noninvasive testing - managed medically for now.   Ventricular tachyarrhythmia    Ventricular tachycardia    Past Surgical History:  Procedure Laterality Date   ABDOMINAL AORTOGRAM W/LOWER EXTREMITY N/A 05/14/2019   Procedure: ABDOMINAL AORTOGRAM W/LOWER EXTREMITY;  Surgeon: Wellington Hampshire, MD;  Location: [facility];   Service: Cardiovascular;  Laterality: N/A;   CARDIAC DEFIBRILLATOR PLACEMENT     STJ single chamber ICD implanted for secondary prevention   CIRCUMCISION     CORONARY ARTERY BYPASS GRAFT  06/17/01   X 4   CORONARY BALLOON ANGIOPLASTY N/A 01/22/2019   Procedure: CORONARY BALLOON ANGIOPLASTY;  Surgeon: Leonie Man, MD;  Location: [facility];  Service: Cardiovascular;  Laterality: N/A;   LEFT HEART CATH AND CORS/GRAFTS ANGIOGRAPHY N/A 01/22/2019   Procedure: LEFT HEART CATH AND CORS/GRAFTS ANGIOGRAPHY;  Surgeon: Leonie Man, MD;  Location: [facility];  Service: Cardiovascular;  Laterality: N/A;   LEFT HEART CATH AND CORS/GRAFTS ANGIOGRAPHY N/A 05/18/2020   Procedure: LEFT HEART CATH AND CORS/GRAFTS ANGIOGRAPHY;  Surgeon: Troy Sine, MD;  Location: [facility];  Service: Cardiovascular;  Laterality: N/A;   PERIPHERAL VASCULAR INTERVENTION Right 05/14/2019   Procedure: PERIPHERAL VASCULAR INTERVENTION;  Surgeon: Wellington Hampshire, MD;  Location: [facility];  Service: Cardiovascular;  Laterality: Right;   SVT ABLATION N/A 06/21/2020   Procedure: SVT ABLATION;  Surgeon: Evans Lance, MD;  Location: [facility];  Service: Cardiovascular;  Laterality: N/A;   Social History   Socioeconomic History   Marital status: Married    Spouse name: Not on file   Number of children: 3   Years of education: 11   Highest education level: Not on file  Occupational History   Occupation: Disability  Tobacco Use   Smoking status: Former    Packs/day: 2.00    Years: 20.00    Pack years: 40.00    Types: Cigarettes    Quit date: 04/04/2003    Years since quitting: 18.0   Smokeless tobacco: Never  Vaping Use   Vaping Use: Never used  Substance and Sexual Activity   Alcohol use: No   Drug use: No   Sexual activity: Yes    Partners: Female  Other Topics Concern   Not on file  Social History Narrative   MARRIED   FORMER TOBACCO USE, SMOKED FOR 20 YRS 2  PPD; QUIT IN 2005   NO ETOH   NO ILLICIT DRUG USE   NO REGULAR EXERCISE         ICD-ST. JUDE ; CERTIFIED LETTER SENT AND RETURNED BAD ADDRESS, PLS UPDATE DJW.      Fun: Physiological scientist Strain: Low Risk    Difficulty of Paying Living Expenses: Not hard at all  Food Insecurity: No Food Insecurity   Worried About Charity fundraiser in the Last Year: Never true   Arboriculturist in the  Last Year: Never true  Transportation Needs: No Transportation Needs   Lack of Transportation (Medical): No   Lack of Transportation (Non-Medical): No  Physical Activity: Insufficiently Active   Days of Exercise per Week: 5 days   Minutes of Exercise per Session: 20 min  Stress: No Stress Concern Present   Feeling of Stress : Not at all  Social Connections: Moderately Integrated   Frequency of Communication with Friends and Family: Twice a week   Frequency of Social Gatherings with Friends and Family: Twice a week   Attends Religious Services: 1 to 4 times per year   Active Member of [employer] or Organizations: No   Attends Music therapist: Never   Marital Status: Married   Family History  Problem Relation Age of Onset   Hypertension Mother    Heart attack Father        DIED AT 7 FROM MI   Heart disease Sister        CAD AND PREVIOUS CABG   Hypertension Other        IN MOST OF HIS SIBLINGS       Review of Systems: Pertinent positive and negative review of systems were noted in the above HPI section. All other review of systems were otherwise negative.    Physical Exam: General: Well developed, well nourished, no acute distress Head: Normocephalic and atraumatic Eyes: Sclerae anicteric, EOMI Ears: Normal auditory acuity Mouth: Not examined, mask on during Covid-19 pandemic Neck: Supple, no masses or thyromegaly Lungs: Clear throughout to auscultation Heart: Regular rate and rhythm; no murmurs, rubs or bruits Abdomen: Soft,  non tender and non distended. No masses, hepatosplenomegaly or hernias noted. Normal Bowel sounds Rectal: Not done Musculoskeletal: Symmetrical with no gross deformities  Skin: No lesions on visible extremities Pulses:  Normal pulses noted Extremities: No clubbing, cyanosis, edema or deformities noted Neurological: Alert oriented x 4, grossly nonfocal Cervical Nodes:  No significant cervical adenopathy Inguinal Nodes: No significant inguinal adenopathy Psychological:  Alert and cooperative. Normal mood and affect   Assessment and Recommendations:  CRC screening, average risk for CRC.  He has a high risk for endoscopic procedures and anesthesia given his cardiac history and ongoing antiplatelet and anticoagulant use.  Offered colonoscopy understanding his elevated risk however CRC screening with Cologuard is a safer option and he agrees.  If Cologuard is positive we will proceed with colonoscopy at the hospital after risk stratification by his cardiologist and clearance to temporarily hold both Plavix and Eliquis.  History of CAD, status post CABG remotely and status post PCI recently, history of VF arrest in 2010, history of SVT, ischemic cardiomyopathy, chronic systolic heart failure, status post ICD, history of VT on amiodarone.  He is maintained on Plavix and Eliquis.  GERD.  Follow antireflux measures and continue pantoprazole 40 mg p.o. daily.   cc: [name], [place],  [place] [phone]

## 2021-04-26 DIAGNOSIS — Z1212 Encounter for screening for malignant neoplasm of rectum: Secondary | ICD-10-CM | POA: Diagnosis not present

## 2021-04-26 DIAGNOSIS — Z1211 Encounter for screening for malignant neoplasm of colon: Secondary | ICD-10-CM | POA: Diagnosis not present

## 2021-04-27 ENCOUNTER — Other Ambulatory Visit: Payer: Self-pay | Admitting: Family

## 2021-04-27 DIAGNOSIS — J302 Other seasonal allergic rhinitis: Secondary | ICD-10-CM

## 2021-04-29 ENCOUNTER — Ambulatory Visit (INDEPENDENT_AMBULATORY_CARE_PROVIDER_SITE_OTHER): Payer: Medicare HMO

## 2021-04-29 DIAGNOSIS — I255 Ischemic cardiomyopathy: Secondary | ICD-10-CM

## 2021-04-29 LAB — CUP PACEART REMOTE DEVICE CHECK
Battery Remaining Longevity: 4 mo
Battery Remaining Percentage: 3 %
Battery Voltage: 2.6 V
Brady Statistic RV Percent Paced: 1 %
Date Time Interrogation Session: 20230127133106
HighPow Impedance: 65 Ohm
Implantable Lead Implant Date: 20101111
Implantable Lead Location: 753860
Implantable Lead Model: 7121
Implantable Pulse Generator Implant Date: 20101111
Lead Channel Impedance Value: 540 Ohm
Lead Channel Pacing Threshold Amplitude: 1 V
Lead Channel Pacing Threshold Pulse Width: 0.5 ms
Lead Channel Sensing Intrinsic Amplitude: 11.7 mV
Lead Channel Setting Pacing Amplitude: 2.5 V
Lead Channel Setting Pacing Pulse Width: 0.5 ms
Lead Channel Setting Sensing Sensitivity: 0.5 mV
Pulse Gen Serial Number: 743367

## 2021-05-03 ENCOUNTER — Ambulatory Visit: Payer: Medicare HMO | Admitting: Dietician

## 2021-05-04 LAB — COLOGUARD: COLOGUARD: POSITIVE — AB

## 2021-05-05 ENCOUNTER — Telehealth: Payer: Self-pay

## 2021-05-05 NOTE — Telephone Encounter (Signed)
[facility] Pre-operative Risk Assessment     Request for surgical clearance:     Endoscopy Procedure  What type of surgery is being performed?     Colonoscopy  When is this surgery scheduled?     TBD  What type of clearance is required ?   Pharmacy  Are there any medications that need to be held prior to surgery and how long? Plavix for 5 days and Eliquis for 2 days  Practice name and name of physician performing surgery?      [facility]  What is your office phone and fax number?      Phone- [phone]  [phone]  Anesthesia type (None, local, MAC, general) ?       MAC

## 2021-05-05 NOTE — Telephone Encounter (Signed)
Clinical pharmacist to review Eliquis 

## 2021-05-09 NOTE — Progress Notes (Signed)
Remote ICD transmission.   

## 2021-05-09 NOTE — Telephone Encounter (Signed)
Hi Dr. Stanford Breed. Billy [name] has an upcoming colonoscopy planned. Patient has a complex medical history including extensive CAD s/p CABG, VF/VT arrest s/p ICD, ischemic cardiomyopathy with EF of 40-45% on last Echo in 06/2020, atrial fibrillation, SVT s/p ablation in 06/2020, Last cath in 05/2020 showed significant multivessel CAD with patent SVG  to Diag, old occlusion of SVG to OM2, old occlusion of SVG to distal RCA, and previously noted atretic LIMA to LAD. Continued medical therapy was recommended at that time. You last saw him in 09/2020 at which time it sounds like he was doing relatively well. He noted occasional chest pain that was non-exertional and would last for 1 minutes and dyspnea with more vigorous activities but was overall stable.  Can you please comment on how long Plavix can be held? Please route response back to P CV [facility] PREOP.  Thank you! Kearie Mennen

## 2021-05-09 NOTE — Telephone Encounter (Signed)
Patient with diagnosis of afib on Eliquis for anticoagulation.    Procedure: colonoscopy Date of procedure: TBD  CHA2DS2-VASc Score = 3  This indicates a 3.2% annual risk of stroke. The patient's score is based upon: CHF History: 1 HTN History: 1 Diabetes History: 0 Stroke History: 0 Vascular Disease History: 1 Age Score: 0 Gender Score: 0  CrCl 93mL/min using adjusted body weight Platelet count 238K  Per office protocol, patient can hold Eliquis for 2 days prior to procedure as requested. Preop team to address Plavix request.

## 2021-05-10 NOTE — Telephone Encounter (Signed)
° °  Patient Name: Billy [name]  DOB: [birth year] MRN: [id number]  Primary Cardiologist: Kirk Ruths, MD / EP - Dr. Caryl Comes  Chart reviewed as part of pre-operative protocol coverage. Last device interrogation noted device to be abnormal and approaching ERI. Will route to Dr. Caryl Comes to inquire whether this will affect his clearance to move forward with colonoscopy in the near future. Dr. Caryl Comes - Please route response to P CV [facility] PREOP (the pre-op pool). Thank you.  Charlie Pitter, PA-C 05/10/2021, 8:33 AM

## 2021-05-15 ENCOUNTER — Other Ambulatory Visit: Payer: Self-pay | Admitting: Cardiology

## 2021-05-17 ENCOUNTER — Telehealth: Payer: Self-pay

## 2021-05-17 NOTE — Telephone Encounter (Signed)
[facility]lin alert received for ICD reaching ERI 05/17/21. Patient and wife updated and advised will need in-clinic apt with provider to discuss gen change. Verbalized understanding. Advised scheduling will be in touch.

## 2021-05-17 NOTE — Telephone Encounter (Signed)
° ° °  Patient Name: Billy [name]  DOB: 1965/12/22 MRN: [id number]  Primary Cardiologist: Kirk Ruths, MD  Chart reviewed as part of pre-operative protocol coverage. Per Dr. Stanford Breed Eliquis can be held for 3 days prior to procedure and Plavix for 5 days. After procedure please restart both as soon as safely possible.I will route this recommendation to the requesting party via Epic fax function and remove from pre-op pool.  Please call with questions.  Mable Fill, Marissa Nestle, NP 05/17/2021, 8:33 AM

## 2021-05-17 NOTE — Telephone Encounter (Signed)
Patient wife called in stating his ICD was buzzing in his chest and it woke him out his sleep. Patient states he has no chest pain or sob. Patient knows his battery is getting low and just wants some reassurance that he is fine, patient is sending in a remote transmission to see if anything was seen

## 2021-05-17 NOTE — Telephone Encounter (Signed)
Awaiting on transmission. Still not received.

## 2021-05-21 ENCOUNTER — Other Ambulatory Visit: Payer: Self-pay | Admitting: Cardiology

## 2021-05-23 NOTE — Telephone Encounter (Signed)
Pt has appointment with Oda Kilts, PA-C on 05/30/2021 to discuss generator change.

## 2021-05-26 ENCOUNTER — Other Ambulatory Visit: Payer: Self-pay | Admitting: Internal Medicine

## 2021-05-26 DIAGNOSIS — I10 Essential (primary) hypertension: Secondary | ICD-10-CM

## 2021-05-26 MED ORDER — ISOSORBIDE MONONITRATE ER 30 MG PO TB24: 30.0000 mg | ORAL_TABLET | Freq: Every day | ORAL | 1 refills | Status: DC

## 2021-05-30 ENCOUNTER — Encounter: Payer: Self-pay | Admitting: Student

## 2021-05-30 ENCOUNTER — Ambulatory Visit (INDEPENDENT_AMBULATORY_CARE_PROVIDER_SITE_OTHER): Payer: Medicare HMO | Admitting: Student

## 2021-05-30 ENCOUNTER — Other Ambulatory Visit: Payer: Self-pay | Admitting: Internal Medicine

## 2021-05-30 ENCOUNTER — Other Ambulatory Visit: Payer: Self-pay

## 2021-05-30 ENCOUNTER — Ambulatory Visit (INDEPENDENT_AMBULATORY_CARE_PROVIDER_SITE_OTHER): Payer: Medicare HMO

## 2021-05-30 VITALS — BP 114/82 | HR 59 | Ht 70.0 in | Wt 270.2 lb

## 2021-05-30 DIAGNOSIS — I471 Supraventricular tachycardia: Secondary | ICD-10-CM | POA: Diagnosis not present

## 2021-05-30 DIAGNOSIS — I4901 Ventricular fibrillation: Secondary | ICD-10-CM | POA: Diagnosis not present

## 2021-05-30 DIAGNOSIS — I5022 Chronic systolic (congestive) heart failure: Secondary | ICD-10-CM | POA: Diagnosis not present

## 2021-05-30 DIAGNOSIS — I255 Ischemic cardiomyopathy: Secondary | ICD-10-CM

## 2021-05-30 DIAGNOSIS — I25119 Atherosclerotic heart disease of native coronary artery with unspecified angina pectoris: Secondary | ICD-10-CM

## 2021-05-30 DIAGNOSIS — I10 Essential (primary) hypertension: Secondary | ICD-10-CM

## 2021-05-30 LAB — CBC
Hematocrit: 48.8 % (ref 37.5–51.0)
Hemoglobin: 16.9 g/dL (ref 13.0–17.7)
MCH: 28.5 pg (ref 26.6–33.0)
MCHC: 34.6 g/dL (ref 31.5–35.7)
MCV: 82 fL (ref 79–97)
Platelets: 264 10*3/uL (ref 150–450)
RBC: 5.93 x10E6/uL — ABNORMAL HIGH (ref 4.14–5.80)
RDW: 14.5 % (ref 11.6–15.4)
WBC: 9.3 10*3/uL (ref 3.4–10.8)

## 2021-05-30 LAB — CUP PACEART INCLINIC DEVICE CHECK
Date Time Interrogation Session: 20230227131057
Implantable Lead Implant Date: 20101111
Implantable Lead Location: 753860
Implantable Lead Model: 7121
Implantable Pulse Generator Implant Date: 20101111
Pulse Gen Serial Number: 743367

## 2021-05-30 LAB — BASIC METABOLIC PANEL
BUN/Creatinine Ratio: 10 (ref 9–20)
BUN: 14 mg/dL (ref 6–24)
CO2: 25 mmol/L (ref 20–29)
Calcium: 9.4 mg/dL (ref 8.7–10.2)
Chloride: 105 mmol/L (ref 96–106)
Creatinine, Ser: 1.36 mg/dL — ABNORMAL HIGH (ref 0.76–1.27)
Glucose: 97 mg/dL (ref 70–99)
Potassium: 4.6 mmol/L (ref 3.5–5.2)
Sodium: 141 mmol/L (ref 134–144)
eGFR: 61 mL/min/{1.73_m2} (ref >59)

## 2021-05-30 MED ORDER — ISOSORBIDE MONONITRATE ER 30 MG PO TB24: 30.0000 mg | ORAL_TABLET | Freq: Every day | ORAL | 3 refills | Status: DC

## 2021-05-30 NOTE — Progress Notes (Signed)
Electrophysiology Office Note Date: 05/30/2021  ID:  Billy [name], DOB 11/02/1965, MRN [id number]  PCP: [name], MD Primary Cardiologist: Kirk Ruths, MD Electrophysiologist: Virl Axe, MD   CC: Routine ICD follow-up  Billy [name] is a 56 y.o. male seen today for Virl Axe, MD for acute visit due to device at [facility] .  Since last being seen in our clinic the patient reports doing well overall. He has had some edema and orthopnea. Drinking 6-7 bottles of water and high sodium food. he denies chest pain, palpitations, dyspnea, PND, orthopnea, nausea, vomiting, dizziness, syncope, edema, weight gain, or early satiety. He has not had ICD shocks.   Device History: St. Jude Single Chamber ICD implanted 02/2009 for VF    Past Medical History:  Diagnosis Date   Aortic atherosclerosis ([place])    on CXR   Blood in stool    C. difficile colitis    a. remote hx 2010.   Cardiac arrest - ventricular fibrillation 02/11/2009   a. 02/2009 s/p St Jude ICD.   Chronic systolic CHF (congestive heart failure) ([name])    Coronary artery disease    a. PCI to Cx age 36. b. CABGx4 in 2003. c. BMS to [place] in 12/2007.   Former tobacco use    Hyperlipidemia    Hypertension    Ischemic cardiomyopathy    Marijuana abuse    Myocardial infarct ([place])    NON-ST-SEGMENT ELEVATION MI   Obesity    PAD (peripheral artery disease) ([place])    a. RLE by noninvasive testing - managed medically for now.   Ventricular tachyarrhythmia    Ventricular tachycardia    Past Surgical History:  Procedure Laterality Date   ABDOMINAL AORTOGRAM W/LOWER EXTREMITY N/A 05/14/2019   Procedure: ABDOMINAL AORTOGRAM W/LOWER EXTREMITY;  Surgeon: Wellington Hampshire, MD;  Location: [facility];  Service: Cardiovascular;  Laterality: N/A;   CARDIAC DEFIBRILLATOR PLACEMENT     STJ single chamber ICD implanted for secondary prevention   CIRCUMCISION     CORONARY ARTERY BYPASS GRAFT  06/17/01   X 4   CORONARY  BALLOON ANGIOPLASTY N/A 01/22/2019   Procedure: CORONARY BALLOON ANGIOPLASTY;  Surgeon: Leonie Man, MD;  Location: [facility];  Service: Cardiovascular;  Laterality: N/A;   LEFT HEART CATH AND CORS/GRAFTS ANGIOGRAPHY N/A 01/22/2019   Procedure: LEFT HEART CATH AND CORS/GRAFTS ANGIOGRAPHY;  Surgeon: Leonie Man, MD;  Location: [facility];  Service: Cardiovascular;  Laterality: N/A;   LEFT HEART CATH AND CORS/GRAFTS ANGIOGRAPHY N/A 05/18/2020   Procedure: LEFT HEART CATH AND CORS/GRAFTS ANGIOGRAPHY;  Surgeon: Troy Sine, MD;  Location: [facility];  Service: Cardiovascular;  Laterality: N/A;   PERIPHERAL VASCULAR INTERVENTION Right 05/14/2019   Procedure: PERIPHERAL VASCULAR INTERVENTION;  Surgeon: Wellington Hampshire, MD;  Location: [facility];  Service: Cardiovascular;  Laterality: Right;   SVT ABLATION N/A 06/21/2020   Procedure: SVT ABLATION;  Surgeon: Evans Lance, MD;  Location: [facility];  Service: Cardiovascular;  Laterality: N/A;    Current Outpatient Medications  Medication Sig Dispense Refill   acetaminophen (TYLENOL) 500 MG tablet Take 1,000 mg by mouth every 6 (six) hours as needed for moderate pain or headache.     amiodarone (PACERONE) 200 MG tablet Take 1 tablet (200 mg total) by mouth daily. 90 tablet 3   apixaban (ELIQUIS) 5 MG TABS tablet Take 1 tablet (5 mg total) by mouth 2 (two) times daily.  180 tablet 1   atorvastatin (LIPITOR) 80 MG tablet TAKE 1 TABLET EVERY DAY 90 tablet 1   carvedilol (COREG) 12.5 MG tablet Take 1 tablet (12.5 mg total) by mouth 2 (two) times daily with a meal. 180 tablet 3   cetirizine (ZYRTEC ALLERGY) 10 MG tablet Take 1 tablet (10 mg total) by mouth daily. 30 tablet 0   clopidogrel (PLAVIX) 75 MG tablet TAKE 1 TABLET EVERY DAY 90 tablet 1   ENTRESTO 24-26 MG TAKE 1 TABLET BY MOUTH TWICE DAILY 180 tablet 1   ezetimibe (ZETIA) 10 MG tablet Take 1 tablet (10 mg total) by mouth daily. 90 tablet 1    fluticasone (FLONASE) 50 MCG/ACT nasal spray Place 2 sprays into both nostrils daily as needed for allergies or rhinitis. 16 g 11   furosemide (LASIX) 40 MG tablet Take 1 tablet (40 mg total) by mouth daily. 90 tablet 3   isosorbide mononitrate (IMDUR) 30 MG 24 hr tablet Take 1 tablet (30 mg total) by mouth daily. 90 tablet 1   levothyroxine (SYNTHROID) 50 MCG tablet Take 1 tablet (50 mcg total) by mouth daily before breakfast. 30 minutes before having breakfast. 90 tablet 0   montelukast (SINGULAIR) 10 MG tablet TAKE 1 TABLET EVERY DAY AS NEEDED FOR ALLERGIES 90 tablet 1   Multiple Vitamin (MULTIVITAMIN WITH MINERALS) TABS tablet Take 1 tablet by mouth daily.     nitroGLYCERIN (NITROSTAT) 0.4 MG SL tablet DISSOLVE 1 TABLET UNDER THE TONGUE EVERY 5 MINUTES FOR 3 DOSES AS NEEDED FOR CHEST PAIN 25 tablet 2   pantoprazole (PROTONIX) 40 MG tablet TAKE 1 TABLET EVERY DAY 90 tablet 1   potassium chloride SA (KLOR-CON M) 20 MEQ tablet TAKE 1 TABLET EVERY DAY 90 tablet 3   No current facility-administered medications for this visit.    Allergies:   Patient has no known allergies.   Social History: Social History   Socioeconomic History   Marital status: Married    Spouse name: Not on file   Number of children: 3   Years of education: 11   Highest education level: Not on file  Occupational History   Occupation: Disability  Tobacco Use   Smoking status: Former    Packs/day: 2.00    Years: 20.00    Pack years: 40.00    Types: Cigarettes    Quit date: 04/04/2003    Years since quitting: 18.1    Passive exposure: Never   Smokeless tobacco: Never  Vaping Use   Vaping Use: Never used  Substance and Sexual Activity   Alcohol use: No   Drug use: No   Sexual activity: Yes    Partners: Female  Other Topics Concern   Not on file  Social History Narrative   MARRIED   FORMER TOBACCO USE, SMOKED FOR 20 YRS 2 PPD; QUIT IN 2005   NO ETOH   NO ILLICIT DRUG USE   NO REGULAR EXERCISE          ICD-ST. JUDE ; CERTIFIED LETTER SENT AND RETURNED BAD ADDRESS, PLS UPDATE DJW.      Fun: Physiological scientist Strain: Low Risk    Difficulty of Paying Living Expenses: Not hard at all  Food Insecurity: No Food Insecurity   Worried About Charity fundraiser in the Last Year: Never true   Arboriculturist in the Last Year: Never true  Transportation Needs: No Transportation Needs   Lack of  Transportation (Medical): No   Lack of Transportation (Non-Medical): No  Physical Activity: Insufficiently Active   Days of Exercise per Week: 5 days   Minutes of Exercise per Session: 20 min  Stress: No Stress Concern Present   Feeling of Stress : Not at all  Social Connections: Moderately Integrated   Frequency of Communication with Friends and Family: Twice a week   Frequency of Social Gatherings with Friends and Family: Twice a week   Attends Religious Services: 1 to 4 times per year   Active Member of [employer] or Organizations: No   Attends Music therapist: Never   Marital Status: Married  Human resources officer Violence: Not At Risk   Fear of Current or Ex-Partner: No   Emotionally Abused: No   Physically Abused: No   Sexually Abused: No    Family History: Family History  Problem Relation Age of Onset   Hypertension Mother    Heart attack Father        DIED AT 65 FROM MI   Heart disease Sister        CAD AND PREVIOUS CABG   Hypertension Other        IN MOST OF HIS SIBLINGS    Review of Systems: All other systems reviewed and are otherwise negative except as noted above.   Physical Exam: Vitals:   05/30/21 1054  BP: 114/82  Pulse: (!) 59  SpO2: 91%  Weight: 270 lb 3.2 oz (122.6 kg)  Height: 5\' 10"  (1.778 m)     GEN- The patient is well appearing, alert and oriented x 3 today.   HEENT: normocephalic, atraumatic; sclera clear, conjunctiva pink; hearing intact; oropharynx clear; neck supple, no JVP Lymph- no cervical  lymphadenopathy Lungs- Clear to ausculation bilaterally, normal work of breathing.  No wheezes, rales, rhonchi Heart- Regular rate and rhythm, no murmurs, rubs or gallops, PMI not laterally displaced GI- soft, non-tender, non-distended, bowel sounds present, no hepatosplenomegaly Extremities- no clubbing or cyanosis. No edema; DP/PT/radial pulses 2+ bilaterally MS- no significant deformity or atrophy Skin- warm and dry, no rash or lesion; ICD pocket well healed Psych- euthymic mood, full affect Neuro- strength and sensation are intact  ICD interrogation- reviewed in detail today,  See PACEART report  EKG:  EKG is ordered today. Personal review of EKG ordered today shows Sinus bradycardia at 59 bpm  Recent Labs: 11/29/2020: ALT 38 03/01/2021: Hemoglobin 16.3; Platelets 238.0; TSH 4.63 03/29/2021: BUN 16; Creatinine, Ser 1.35; Potassium 4.2; Sodium 143   Wt Readings from Last 3 Encounters:  05/30/21 270 lb 3.2 oz (122.6 kg)  04/20/21 277 lb (125.6 kg)  03/01/21 277 lb 12.8 oz (126 kg)     Other studies Reviewed: Additional studies/ records that were reviewed today include: Previous EP office notes.   Assessment and Plan:  1.  Chronic systolic dysfunction s/p St. Jude single chamber ICD  euvolemic today Stable on an appropriate medical regimen Normal ICD function for a device at ERI as of 05/17/2021.  Echo 06/03/2020 LVEF 40-45%;  Will update See Pace Art report No changes today Counseled on fluid and sodium restriction.  2. H/o VT/VF Quiescent on device Continue BB.  Amiodarone surveillance labs.   Should discuss if needs to continue at f/u with Dr. Caryl Comes  3. SVT S/p EPS/RFA of AT by Dr. Lovena Le without recurrence.   4. Obesity Body mass index is 38.77 kg/m.  Weight loss has been encouraged  Current medicines are reviewed at length with the patient today.  Labs/ tests ordered today include:  Orders Placed This Encounter  Procedures   Basic metabolic panel   CBC    EKG 12-Lead   ECHOCARDIOGRAM COMPLETE    Disposition:   Follow up with Dr. Caryl Comes in as usual post gen change    Signed, Shirley Friar, PA-C  05/30/2021 10:56 AM  [facility] [place] [place] [address] [phone] (office) [phone] (fax)

## 2021-05-30 NOTE — Patient Instructions (Signed)
Medication Instructions:  Your physician recommends that you continue on your current medications as directed. Please refer to the Current Medication list given to you today.  *If you need a refill on your cardiac medications before your next appointment, please call your pharmacy*   Lab Work: TODAY: BMET, CBC  If you have labs (blood work) drawn today and your tests are completely normal, you will receive your results only by: [facility] (if you have MyChart) OR A paper copy in the mail If you have any lab test that is abnormal or we need to change your treatment, we will call you to review the results.   Testing/Procedures: Your physician has requested that you have an echocardiogram BEFORE 06/17/2021. Echocardiography is a painless test that uses sound waves to create images of your heart. It provides your doctor with information about the size and shape of your heart and how well your hearts chambers and valves are working. This procedure takes approximately one hour. There are no restrictions for this procedure.  We will call  you to schedule your follow up appointments post procedure.

## 2021-06-01 LAB — CUP PACEART REMOTE DEVICE CHECK
Battery Remaining Longevity: 0 mo
Battery Voltage: 2.59 V
Brady Statistic RV Percent Paced: 1 %
Date Time Interrogation Session: 20230228103553
HighPow Impedance: 60 Ohm
Implantable Lead Implant Date: 20101111
Implantable Lead Location: 753860
Implantable Lead Model: 7121
Implantable Pulse Generator Implant Date: 20101111
Lead Channel Impedance Value: 530 Ohm
Lead Channel Pacing Threshold Amplitude: 1 V
Lead Channel Pacing Threshold Pulse Width: 0.5 ms
Lead Channel Sensing Intrinsic Amplitude: 11.7 mV
Lead Channel Setting Pacing Amplitude: 2.5 V
Lead Channel Setting Pacing Pulse Width: 0.5 ms
Lead Channel Setting Sensing Sensitivity: 0.5 mV
Pulse Gen Serial Number: 743367

## 2021-06-06 ENCOUNTER — Telehealth: Payer: Self-pay | Admitting: Family Medicine

## 2021-06-06 DIAGNOSIS — E039 Hypothyroidism, unspecified: Secondary | ICD-10-CM

## 2021-06-06 MED ORDER — LEVOTHYROXINE SODIUM 50 MCG PO TABS: 50.0000 ug | ORAL_TABLET | Freq: Every day | ORAL | 0 refills | Status: DC

## 2021-06-06 NOTE — Addendum Note (Signed)
Addended by: Lynda Rainwater on: 06/06/2021 10:15 AM   Modules accepted: Orders

## 2021-06-06 NOTE — Telephone Encounter (Signed)
Pt wife called and was told by his pharmacy called that they were going to send in a refill request for levothyroxine, She said he is out and asked [facility] [address]. Callback is [phone] ? ?She also said that Dr Ethelene Hal said something about pt getting a sleep app machine and she wasn't sure about how to get it and asked for a call back. Please advise ?

## 2021-06-06 NOTE — Progress Notes (Signed)
Remote ICD transmission.   

## 2021-06-06 NOTE — Telephone Encounter (Signed)
Requested refill sent in, spoke with wife regarding referral that was just put in informed someone will call to schedule appointment.  ?

## 2021-06-07 ENCOUNTER — Other Ambulatory Visit: Payer: Self-pay | Admitting: Family

## 2021-06-07 ENCOUNTER — Other Ambulatory Visit: Payer: Self-pay | Admitting: Family Medicine

## 2021-06-07 DIAGNOSIS — E039 Hypothyroidism, unspecified: Secondary | ICD-10-CM

## 2021-06-07 DIAGNOSIS — J302 Other seasonal allergic rhinitis: Secondary | ICD-10-CM

## 2021-06-13 ENCOUNTER — Other Ambulatory Visit (HOSPITAL_COMMUNITY): Payer: Self-pay | Admitting: Cardiovascular Disease

## 2021-06-13 DIAGNOSIS — I739 Peripheral vascular disease, unspecified: Secondary | ICD-10-CM

## 2021-06-13 DIAGNOSIS — Z95828 Presence of other vascular implants and grafts: Secondary | ICD-10-CM

## 2021-06-14 ENCOUNTER — Encounter: Payer: Self-pay | Admitting: Cardiology

## 2021-06-14 ENCOUNTER — Telehealth: Payer: Self-pay | Admitting: Internal Medicine

## 2021-06-14 NOTE — Telephone Encounter (Signed)
Pt c/o medication issue: ? ?1. Name of Medication: apixaban (ELIQUIS) 5 MG TABS tablet ? ?2. How are you currently taking this medication (dosage and times per day)? 1 tablet twice a day ? ?3. Are you having a reaction (difficulty breathing--STAT)? no ? ?4. What is your medication issue? Patient's wife states they know he is supposed to stop the eliquis, but need to verify if he stops it today. She also says they are not sure if he has to stop any other medications. She says she thinks they were told not to take anything the morning of his procedure. Patient's procedure is 3/17 ?

## 2021-06-14 NOTE — Telephone Encounter (Signed)
Left message to return call 

## 2021-06-14 NOTE — Telephone Encounter (Signed)
error 

## 2021-06-15 ENCOUNTER — Other Ambulatory Visit: Payer: Self-pay

## 2021-06-15 ENCOUNTER — Ambulatory Visit (HOSPITAL_COMMUNITY): Payer: Medicare HMO | Attending: Cardiovascular Disease

## 2021-06-15 DIAGNOSIS — I5022 Chronic systolic (congestive) heart failure: Secondary | ICD-10-CM

## 2021-06-15 DIAGNOSIS — I471 Supraventricular tachycardia: Secondary | ICD-10-CM | POA: Insufficient documentation

## 2021-06-15 DIAGNOSIS — I25119 Atherosclerotic heart disease of native coronary artery with unspecified angina pectoris: Secondary | ICD-10-CM | POA: Insufficient documentation

## 2021-06-15 DIAGNOSIS — I10 Essential (primary) hypertension: Secondary | ICD-10-CM

## 2021-06-15 DIAGNOSIS — I4901 Ventricular fibrillation: Secondary | ICD-10-CM | POA: Insufficient documentation

## 2021-06-15 LAB — ECHOCARDIOGRAM COMPLETE
Area-P 1/2: 1.52 cm2
S' Lateral: 4.3 cm

## 2021-06-15 MED ORDER — PERFLUTREN LIPID MICROSPHERE
3.0000 mL | INTRAVENOUS | Status: AC | PRN
  Administered 2021-06-15: 3 mL via INTRAVENOUS

## 2021-06-15 NOTE — Telephone Encounter (Signed)
Per instructions: Procedure 06/17/21 ? ? ?Please wash with the CHG Soap the night before and morning of procedure (follow instruction page "Preparing For Surgery").  ? ?Please report to the [facility] at 5:30AM ?([place], [place] [address]) ? ?DO NOT eat or drink anything after midnight the night before procedure ? ?HOLD all of your morning medications the day of your procedure. ?HOLD Eliquis 4 doses prior to procedure. Take your last dose on 06/14/21 in the PM. ? ?You will need someone to drive you home after the procedure ?

## 2021-06-16 NOTE — Pre-Procedure Instructions (Signed)
Instructed patient on the following items: ?Arrival time 0930 ? ?Nothing to eat or drink after midnight ?No meds AM of procedure ?Responsible person to drive you home and stay with you for 24 hrs ?Wash with special soap night before and morning of procedure ?If on anti-coagulant drug instructions Eliquis- last dose 06/15/21  ?

## 2021-06-17 ENCOUNTER — Ambulatory Visit (HOSPITAL_COMMUNITY)
Admission: RE | Disposition: A | Discharge status: Home / Self Care | Payer: Self-pay | Source: Home / Self Care | Attending: Internal Medicine

## 2021-06-17 ENCOUNTER — Other Ambulatory Visit: Payer: Self-pay

## 2021-06-17 ENCOUNTER — Ambulatory Visit (HOSPITAL_COMMUNITY)
Admission: RE | Admit: 2021-06-17 | Discharge: 2021-06-17 | Disposition: A | Discharge status: Home / Self Care | Payer: Medicare HMO | Attending: Internal Medicine | Admitting: Internal Medicine

## 2021-06-17 ENCOUNTER — Encounter (HOSPITAL_COMMUNITY): Payer: Self-pay | Admitting: Internal Medicine

## 2021-06-17 DIAGNOSIS — I252 Old myocardial infarction: Secondary | ICD-10-CM | POA: Insufficient documentation

## 2021-06-17 DIAGNOSIS — I700 Atherosclerosis of aorta: Secondary | ICD-10-CM | POA: Insufficient documentation

## 2021-06-17 DIAGNOSIS — Z8674 Personal history of sudden cardiac arrest: Secondary | ICD-10-CM | POA: Diagnosis not present

## 2021-06-17 DIAGNOSIS — Z87891 Personal history of nicotine dependence: Secondary | ICD-10-CM | POA: Insufficient documentation

## 2021-06-17 DIAGNOSIS — Z79899 Other long term (current) drug therapy: Secondary | ICD-10-CM | POA: Diagnosis not present

## 2021-06-17 DIAGNOSIS — I471 Supraventricular tachycardia: Secondary | ICD-10-CM | POA: Insufficient documentation

## 2021-06-17 DIAGNOSIS — I739 Peripheral vascular disease, unspecified: Secondary | ICD-10-CM | POA: Insufficient documentation

## 2021-06-17 DIAGNOSIS — I255 Ischemic cardiomyopathy: Secondary | ICD-10-CM | POA: Diagnosis not present

## 2021-06-17 DIAGNOSIS — I5022 Chronic systolic (congestive) heart failure: Secondary | ICD-10-CM | POA: Diagnosis not present

## 2021-06-17 DIAGNOSIS — Z7901 Long term (current) use of anticoagulants: Secondary | ICD-10-CM | POA: Diagnosis not present

## 2021-06-17 DIAGNOSIS — I110 Hypertensive heart disease with heart failure: Secondary | ICD-10-CM | POA: Diagnosis not present

## 2021-06-17 DIAGNOSIS — E669 Obesity, unspecified: Secondary | ICD-10-CM | POA: Diagnosis not present

## 2021-06-17 DIAGNOSIS — Z951 Presence of aortocoronary bypass graft: Secondary | ICD-10-CM | POA: Insufficient documentation

## 2021-06-17 DIAGNOSIS — Z4502 Encounter for adjustment and management of automatic implantable cardiac defibrillator: Secondary | ICD-10-CM | POA: Diagnosis not present

## 2021-06-17 DIAGNOSIS — Z79890 Hormone replacement therapy (postmenopausal): Secondary | ICD-10-CM | POA: Diagnosis not present

## 2021-06-17 DIAGNOSIS — E785 Hyperlipidemia, unspecified: Secondary | ICD-10-CM | POA: Insufficient documentation

## 2021-06-17 DIAGNOSIS — Z6838 Body mass index (BMI) 38.0-38.9, adult: Secondary | ICD-10-CM | POA: Insufficient documentation

## 2021-06-17 DIAGNOSIS — I2510 Atherosclerotic heart disease of native coronary artery without angina pectoris: Secondary | ICD-10-CM | POA: Diagnosis not present

## 2021-06-17 HISTORY — PX: ICD GENERATOR CHANGEOUT: EP1231

## 2021-06-17 SURGERY — ICD GENERATOR CHANGEOUT
Anesthesia: LOCAL

## 2021-06-17 MED ORDER — ACETAMINOPHEN 325 MG PO TABS: 325.0000 mg | ORAL_TABLET | ORAL | Status: DC | PRN

## 2021-06-17 MED ORDER — FENTANYL CITRATE (PF) 100 MCG/2ML IJ SOLN
INTRAMUSCULAR | Status: AC
  Filled 2021-06-17: qty 2

## 2021-06-17 MED ORDER — SODIUM CHLORIDE 0.9 % IV SOLN
80.0000 mg | INTRAVENOUS | Status: AC
  Administered 2021-06-17: 80 mg

## 2021-06-17 MED ORDER — LIDOCAINE HCL 1 % IJ SOLN
INTRAMUSCULAR | Status: AC
  Filled 2021-06-17: qty 60

## 2021-06-17 MED ORDER — CHLORHEXIDINE GLUCONATE 4 % EX LIQD
4.0000 "application " | Freq: Once | CUTANEOUS | Status: DC
  Filled 2021-06-17: qty 60

## 2021-06-17 MED ORDER — SODIUM CHLORIDE 0.9 % IV SOLN
INTRAVENOUS | Status: AC
  Filled 2021-06-17: qty 2

## 2021-06-17 MED ORDER — SODIUM CHLORIDE 0.9 % IV SOLN: INTRAVENOUS | Status: DC

## 2021-06-17 MED ORDER — MIDAZOLAM HCL 5 MG/5ML IJ SOLN
INTRAMUSCULAR | Status: DC | PRN
  Administered 2021-06-17: 2 mg via INTRAVENOUS

## 2021-06-17 MED ORDER — CEFAZOLIN IN SODIUM CHLORIDE 3-0.9 GM/100ML-% IV SOLN
3.0000 g | INTRAVENOUS | Status: AC
  Administered 2021-06-17: 3 g via INTRAVENOUS
  Filled 2021-06-17 (×2): qty 100

## 2021-06-17 MED ORDER — FENTANYL CITRATE (PF) 100 MCG/2ML IJ SOLN
INTRAMUSCULAR | Status: DC | PRN
  Administered 2021-06-17: 50 ug via INTRAVENOUS

## 2021-06-17 MED ORDER — MIDAZOLAM HCL 5 MG/5ML IJ SOLN
INTRAMUSCULAR | Status: AC
  Filled 2021-06-17: qty 5

## 2021-06-17 MED ORDER — LIDOCAINE HCL (PF) 1 % IJ SOLN
INTRAMUSCULAR | Status: DC | PRN
  Administered 2021-06-17 (×2): 60 mL

## 2021-06-17 SURGICAL SUPPLY — 5 items
CABLE SURGICAL S-101-97-12 (CABLE) ×2 IMPLANT
HEMOSTAT SURGICEL 2X4 FIBR (HEMOSTASIS) ×1 IMPLANT
ICD GALLANT VR CDVRA500Q (ICD Generator) ×1 IMPLANT
PAD DEFIB RADIO PHYSIO CONN (PAD) ×3 IMPLANT
TRAY PACEMAKER INSERTION (PACKS) ×2 IMPLANT

## 2021-06-17 NOTE — Discharge Instructions (Signed)

## 2021-06-17 NOTE — Interval H&P Note (Signed)
History and Physical Interval Note: ? ?06/17/2021 ?9:41 AM ? ?Billy [name]  has presented today for surgery, with the diagnosis of ERI.  The various methods of treatment have been discussed with the patient and family. After consideration of risks, benefits and other options for treatment, the patient has consented to  Procedure(s): ?ICD [place] (N/A) as a surgical intervention.  The patient's history has been reviewed, patient examined, no change in status, stable for surgery.  I have reviewed the patient's chart and labs.  Questions were answered to the patient's satisfaction.   ? ? ?Virl Axe ? ? ?

## 2021-06-17 NOTE — Interval H&P Note (Signed)
History and Physical Interval Note: ? ?06/17/2021 ?9:48 AM ? ?Billy [name]  has presented today for surgery, with the diagnosis of ERI.  The various methods of treatment have been discussed with the patient and family. After consideration of risks, benefits and other options for treatment, the patient has consented to  Procedure(s): ?ICD [place] (N/A) as a surgical intervention.  The patient's history has been reviewed, patient examined, no change in status, stable for surgery.  I have reviewed the patient's chart and labs.  Questions were answered to the patient's satisfaction.   ? ? ?Billy [name] ? ?Following SVT ablation 3/22 no recurrent SVT and secondary VT-PM  and will stop amio ?

## 2021-06-20 ENCOUNTER — Ambulatory Visit (HOSPITAL_COMMUNITY)
Admission: RE | Admit: 2021-06-20 | Discharge: 2021-06-20 | Disposition: A | Discharge status: Home / Self Care | Payer: Medicare HMO | Source: Ambulatory Visit | Attending: Cardiology | Admitting: Cardiology

## 2021-06-20 ENCOUNTER — Other Ambulatory Visit: Payer: Self-pay

## 2021-06-20 ENCOUNTER — Ambulatory Visit (HOSPITAL_BASED_OUTPATIENT_CLINIC_OR_DEPARTMENT_OTHER)
Admission: RE | Admit: 2021-06-20 | Discharge: 2021-06-20 | Disposition: A | Discharge status: Home / Self Care | Payer: Medicare HMO | Source: Ambulatory Visit | Attending: Cardiology | Admitting: Cardiology

## 2021-06-20 DIAGNOSIS — I739 Peripheral vascular disease, unspecified: Secondary | ICD-10-CM | POA: Insufficient documentation

## 2021-06-20 DIAGNOSIS — Z95828 Presence of other vascular implants and grafts: Secondary | ICD-10-CM | POA: Insufficient documentation

## 2021-06-20 MED FILL — Gentamicin Sulfate Inj 40 MG/ML: INTRAMUSCULAR | Qty: 80 | Status: AC

## 2021-06-28 ENCOUNTER — Other Ambulatory Visit: Payer: Self-pay | Admitting: *Deleted

## 2021-06-28 ENCOUNTER — Encounter: Payer: Self-pay | Admitting: *Deleted

## 2021-06-28 DIAGNOSIS — I739 Peripheral vascular disease, unspecified: Secondary | ICD-10-CM

## 2021-06-29 ENCOUNTER — Other Ambulatory Visit: Payer: Self-pay | Admitting: Cardiology

## 2021-06-29 ENCOUNTER — Ambulatory Visit (INDEPENDENT_AMBULATORY_CARE_PROVIDER_SITE_OTHER): Payer: Medicare HMO

## 2021-06-29 DIAGNOSIS — I255 Ischemic cardiomyopathy: Secondary | ICD-10-CM | POA: Diagnosis not present

## 2021-06-29 LAB — CUP PACEART INCLINIC DEVICE CHECK
Battery Remaining Longevity: 128 mo
Brady Statistic RV Percent Paced: 0.13 %
Date Time Interrogation Session: 20230329162039
HighPow Impedance: 53.4336
Implantable Pulse Generator Implant Date: 20101111
Lead Channel Impedance Value: 512.5 Ohm
Lead Channel Pacing Threshold Amplitude: 1 V
Lead Channel Pacing Threshold Amplitude: 1 V
Lead Channel Pacing Threshold Pulse Width: 0.5 ms
Lead Channel Pacing Threshold Pulse Width: 0.5 ms
Lead Channel Sensing Intrinsic Amplitude: 12 mV
Lead Channel Setting Pacing Amplitude: 2.5 V
Lead Channel Setting Pacing Pulse Width: 0.5 ms
Lead Channel Setting Sensing Sensitivity: 0.5 mV
Pulse Gen Serial Number: 210001302

## 2021-06-29 NOTE — Progress Notes (Signed)
Wound check appointment. Dermabond removed . Wound without redness or edema. Incision edges superficially un- approximated, site assessed by SK see patient instructions for recommendations. Normal device function. Threshold, sensing, and impedances consistent with implant measurements. Device programmed at chronic output values. Histogram distribution appropriate for patient and level of activity. No ventricular arrhythmias noted. Patient educated about wound care, arm mobility, shock plan. ROV 07/07/21 for wound re-check . ?

## 2021-06-29 NOTE — Patient Instructions (Signed)
? ?  After Your ICD ?(Implantable Cardiac Defibrillator) ? ? ? ?Monitor your defibrillator site for redness, swelling, and drainage. Call the device clinic at [phone] if you experience these symptoms or fever/chills. ? ?Wash with clean wash cloth  soap and water twice a day, pat dry with clean towel each time. Cover with clean bandage after washing. Wear clean shirt everyday and clean shirt to bed.  ? ? ?Your ICD is designed to protect you from life threatening heart rhythms. Because of this, you may receive a shock.  ? ?1 shock with no symptoms:  Call the office during business hours. ?1 shock with symptoms (chest pain, chest pressure, dizziness, lightheadedness, shortness of breath, overall feeling unwell):  Call 911. ?If you experience 2 or more shocks in 24 hours:  Call 911. ?If you receive a shock, you should not drive.  ?[place] DMV - no driving for 6 months if you receive appropriate therapy from your ICD.  ? ?ICD Alerts:  Some alerts are vibratory and others beep. These are NOT emergencies. Please call our office to let us know. If this occurs at night or on weekends, it can wait until the next business day. Send a remote transmission. ? ? ?Remote monitoring is used to monitor your ICD from home. This monitoring is scheduled every 91 days by our office. It allows [place] to keep an eye on the functioning of your device to ensure it is working properly. You will routinely see your Electrophysiologist annually (more often if necessary).  ?

## 2021-06-30 ENCOUNTER — Ambulatory Visit (INDEPENDENT_AMBULATORY_CARE_PROVIDER_SITE_OTHER): Payer: Medicare HMO

## 2021-06-30 DIAGNOSIS — I255 Ischemic cardiomyopathy: Secondary | ICD-10-CM

## 2021-06-30 LAB — CUP PACEART REMOTE DEVICE CHECK
Battery Remaining Longevity: 124 mo
Battery Remaining Percentage: 95.5 %
Battery Voltage: 3.16 V
Brady Statistic RV Percent Paced: 1 %
Date Time Interrogation Session: 20230330020224
HighPow Impedance: 55 Ohm
Implantable Lead Implant Date: 20101111
Implantable Lead Location: 753860
Implantable Lead Model: 7121
Implantable Pulse Generator Implant Date: 20230317
Lead Channel Impedance Value: 530 Ohm
Lead Channel Pacing Threshold Amplitude: 1 V
Lead Channel Pacing Threshold Pulse Width: 0.5 ms
Lead Channel Sensing Intrinsic Amplitude: 12 mV
Lead Channel Setting Pacing Amplitude: 2.5 V
Lead Channel Setting Pacing Pulse Width: 0.5 ms
Lead Channel Setting Sensing Sensitivity: 0.5 mV
Pulse Gen Serial Number: 210001302

## 2021-07-01 ENCOUNTER — Inpatient Hospital Stay (HOSPITAL_COMMUNITY)
Admission: EM | Admit: 2021-07-01 | Discharge: 2021-07-08 | DRG: 493 | Disposition: A | Discharge status: Home Health | Payer: Medicare HMO | Attending: Internal Medicine | Admitting: Internal Medicine

## 2021-07-01 ENCOUNTER — Emergency Department (HOSPITAL_COMMUNITY): Payer: Medicare HMO

## 2021-07-01 ENCOUNTER — Encounter (HOSPITAL_COMMUNITY): Payer: Self-pay | Admitting: Internal Medicine

## 2021-07-01 DIAGNOSIS — Z955 Presence of coronary angioplasty implant and graft: Secondary | ICD-10-CM

## 2021-07-01 DIAGNOSIS — W010XXA Fall on same level from slipping, tripping and stumbling without subsequent striking against object, initial encounter: Secondary | ICD-10-CM | POA: Diagnosis present

## 2021-07-01 DIAGNOSIS — Z951 Presence of aortocoronary bypass graft: Secondary | ICD-10-CM | POA: Diagnosis not present

## 2021-07-01 DIAGNOSIS — E039 Hypothyroidism, unspecified: Secondary | ICD-10-CM | POA: Diagnosis not present

## 2021-07-01 DIAGNOSIS — M79662 Pain in left lower leg: Secondary | ICD-10-CM | POA: Diagnosis present

## 2021-07-01 DIAGNOSIS — S73005A Unspecified dislocation of left hip, initial encounter: Secondary | ICD-10-CM | POA: Diagnosis not present

## 2021-07-01 DIAGNOSIS — E1151 Type 2 diabetes mellitus with diabetic peripheral angiopathy without gangrene: Secondary | ICD-10-CM | POA: Diagnosis present

## 2021-07-01 DIAGNOSIS — S32402A Unspecified fracture of left acetabulum, initial encounter for closed fracture: Secondary | ICD-10-CM | POA: Diagnosis not present

## 2021-07-01 DIAGNOSIS — D62 Acute posthemorrhagic anemia: Secondary | ICD-10-CM | POA: Diagnosis present

## 2021-07-01 DIAGNOSIS — E785 Hyperlipidemia, unspecified: Secondary | ICD-10-CM | POA: Diagnosis present

## 2021-07-01 DIAGNOSIS — Z95810 Presence of automatic (implantable) cardiac defibrillator: Secondary | ICD-10-CM

## 2021-07-01 DIAGNOSIS — E1122 Type 2 diabetes mellitus with diabetic chronic kidney disease: Secondary | ICD-10-CM | POA: Diagnosis not present

## 2021-07-01 DIAGNOSIS — Z01810 Encounter for preprocedural cardiovascular examination: Secondary | ICD-10-CM | POA: Diagnosis not present

## 2021-07-01 DIAGNOSIS — I5022 Chronic systolic (congestive) heart failure: Secondary | ICD-10-CM

## 2021-07-01 DIAGNOSIS — Z7901 Long term (current) use of anticoagulants: Secondary | ICD-10-CM | POA: Diagnosis not present

## 2021-07-01 DIAGNOSIS — I739 Peripheral vascular disease, unspecified: Secondary | ICD-10-CM | POA: Diagnosis not present

## 2021-07-01 DIAGNOSIS — E559 Vitamin D deficiency, unspecified: Secondary | ICD-10-CM | POA: Diagnosis present

## 2021-07-01 DIAGNOSIS — W19XXXA Unspecified fall, initial encounter: Secondary | ICD-10-CM | POA: Diagnosis not present

## 2021-07-01 DIAGNOSIS — Z8249 Family history of ischemic heart disease and other diseases of the circulatory system: Secondary | ICD-10-CM

## 2021-07-01 DIAGNOSIS — I130 Hypertensive heart and chronic kidney disease with heart failure and stage 1 through stage 4 chronic kidney disease, or unspecified chronic kidney disease: Secondary | ICD-10-CM | POA: Diagnosis not present

## 2021-07-01 DIAGNOSIS — I10 Essential (primary) hypertension: Secondary | ICD-10-CM | POA: Diagnosis present

## 2021-07-01 DIAGNOSIS — Y92007 Garden or yard of unspecified non-institutional (private) residence as the place of occurrence of the external cause: Secondary | ICD-10-CM | POA: Diagnosis not present

## 2021-07-01 DIAGNOSIS — I255 Ischemic cardiomyopathy: Secondary | ICD-10-CM | POA: Diagnosis present

## 2021-07-01 DIAGNOSIS — M9689 Other intraoperative and postprocedural complications and disorders of the musculoskeletal system: Secondary | ICD-10-CM | POA: Diagnosis not present

## 2021-07-01 DIAGNOSIS — Z6836 Body mass index (BMI) 36.0-36.9, adult: Secondary | ICD-10-CM | POA: Diagnosis not present

## 2021-07-01 DIAGNOSIS — I480 Paroxysmal atrial fibrillation: Secondary | ICD-10-CM | POA: Diagnosis present

## 2021-07-01 DIAGNOSIS — S32492A Other specified fracture of left acetabulum, initial encounter for closed fracture: Principal | ICD-10-CM | POA: Diagnosis present

## 2021-07-01 DIAGNOSIS — I25119 Atherosclerotic heart disease of native coronary artery with unspecified angina pectoris: Secondary | ICD-10-CM | POA: Diagnosis not present

## 2021-07-01 DIAGNOSIS — N1831 Chronic kidney disease, stage 3a: Secondary | ICD-10-CM | POA: Diagnosis not present

## 2021-07-01 DIAGNOSIS — I252 Old myocardial infarction: Secondary | ICD-10-CM

## 2021-07-01 DIAGNOSIS — Z87891 Personal history of nicotine dependence: Secondary | ICD-10-CM | POA: Diagnosis not present

## 2021-07-01 DIAGNOSIS — I509 Heart failure, unspecified: Secondary | ICD-10-CM | POA: Diagnosis not present

## 2021-07-01 DIAGNOSIS — S32422A Displaced fracture of posterior wall of left acetabulum, initial encounter for closed fracture: Secondary | ICD-10-CM | POA: Diagnosis not present

## 2021-07-01 DIAGNOSIS — Z043 Encounter for examination and observation following other accident: Secondary | ICD-10-CM | POA: Diagnosis not present

## 2021-07-01 DIAGNOSIS — S73015A Posterior dislocation of left hip, initial encounter: Secondary | ICD-10-CM | POA: Diagnosis not present

## 2021-07-01 DIAGNOSIS — M810 Age-related osteoporosis without current pathological fracture: Secondary | ICD-10-CM | POA: Diagnosis present

## 2021-07-01 DIAGNOSIS — I5042 Chronic combined systolic (congestive) and diastolic (congestive) heart failure: Secondary | ICD-10-CM | POA: Diagnosis present

## 2021-07-01 DIAGNOSIS — S32432A Displaced fracture of anterior column [iliopubic] of left acetabulum, initial encounter for closed fracture: Secondary | ICD-10-CM | POA: Diagnosis not present

## 2021-07-01 DIAGNOSIS — K219 Gastro-esophageal reflux disease without esophagitis: Secondary | ICD-10-CM | POA: Diagnosis not present

## 2021-07-01 DIAGNOSIS — I110 Hypertensive heart disease with heart failure: Secondary | ICD-10-CM | POA: Diagnosis not present

## 2021-07-01 DIAGNOSIS — M84454A Pathological fracture, pelvis, initial encounter for fracture: Secondary | ICD-10-CM | POA: Diagnosis present

## 2021-07-01 DIAGNOSIS — S72002A Fracture of unspecified part of neck of left femur, initial encounter for closed fracture: Secondary | ICD-10-CM | POA: Diagnosis not present

## 2021-07-01 DIAGNOSIS — R531 Weakness: Secondary | ICD-10-CM | POA: Diagnosis not present

## 2021-07-01 DIAGNOSIS — M7981 Nontraumatic hematoma of soft tissue: Secondary | ICD-10-CM | POA: Diagnosis not present

## 2021-07-01 DIAGNOSIS — R5381 Other malaise: Secondary | ICD-10-CM | POA: Diagnosis not present

## 2021-07-01 DIAGNOSIS — S32412A Displaced fracture of anterior wall of left acetabulum, initial encounter for closed fracture: Secondary | ICD-10-CM | POA: Diagnosis not present

## 2021-07-01 DIAGNOSIS — I2510 Atherosclerotic heart disease of native coronary artery without angina pectoris: Secondary | ICD-10-CM | POA: Diagnosis present

## 2021-07-01 HISTORY — DX: Supraventricular tachycardia: I47.1

## 2021-07-01 HISTORY — DX: Paroxysmal atrial fibrillation: I48.0

## 2021-07-01 HISTORY — DX: Supraventricular tachycardia, unspecified: I47.10

## 2021-07-01 HISTORY — DX: Chronic combined systolic (congestive) and diastolic (congestive) heart failure: I50.42

## 2021-07-01 HISTORY — DX: Chronic kidney disease, stage 3a: N18.31

## 2021-07-01 LAB — CBC WITH DIFFERENTIAL/PLATELET
Abs Immature Granulocytes: 0.05 10*3/uL (ref 0.00–0.07)
Basophils Absolute: 0.1 10*3/uL (ref 0.0–0.1)
Basophils Relative: 1 %
Eosinophils Absolute: 0.2 10*3/uL (ref 0.0–0.5)
Eosinophils Relative: 2 %
HCT: 47.1 % (ref 39.0–52.0)
Hemoglobin: 16 g/dL (ref 13.0–17.0)
Immature Granulocytes: 0 %
Lymphocytes Relative: 18 %
Lymphs Abs: 2 10*3/uL (ref 0.7–4.0)
MCH: 27.9 pg (ref 26.0–34.0)
MCHC: 34 g/dL (ref 30.0–36.0)
MCV: 82.1 fL (ref 80.0–100.0)
Monocytes Absolute: 1 10*3/uL (ref 0.1–1.0)
Monocytes Relative: 9 %
Neutro Abs: 8.1 10*3/uL — ABNORMAL HIGH (ref 1.7–7.7)
Neutrophils Relative %: 70 %
Platelets: 271 10*3/uL (ref 150–400)
RBC: 5.74 MIL/uL (ref 4.22–5.81)
RDW: 14.8 % (ref 11.5–15.5)
WBC: 11.5 10*3/uL — ABNORMAL HIGH (ref 4.0–10.5)
nRBC: 0 % (ref 0.0–0.2)

## 2021-07-01 LAB — BASIC METABOLIC PANEL
Anion gap: 8 (ref 5–15)
BUN: 13 mg/dL (ref 6–20)
CO2: 24 mmol/L (ref 22–32)
Calcium: 9 mg/dL (ref 8.9–10.3)
Chloride: 109 mmol/L (ref 98–111)
Creatinine, Ser: 1.41 mg/dL — ABNORMAL HIGH (ref 0.61–1.24)
GFR, Estimated: 59 mL/min — ABNORMAL LOW (ref >60)
Glucose, Bld: 93 mg/dL (ref 70–99)
Potassium: 3.8 mmol/L (ref 3.5–5.1)
Sodium: 141 mmol/L (ref 135–145)

## 2021-07-01 LAB — TYPE AND SCREEN
ABO/RH(D): O POS
Antibody Screen: NEGATIVE

## 2021-07-01 LAB — PROTIME-INR
INR: 1.2 (ref 0.8–1.2)
Prothrombin Time: 14.8 seconds (ref 11.4–15.2)

## 2021-07-01 LAB — ABO/RH: ABO/RH(D): O POS

## 2021-07-01 MED ORDER — SODIUM CHLORIDE 0.9 % IV SOLN: INTRAVENOUS | Status: DC

## 2021-07-01 MED ORDER — ONDANSETRON HCL 4 MG/2ML IJ SOLN
4.0000 mg | Freq: Once | INTRAMUSCULAR | Status: AC
  Administered 2021-07-01: 4 mg via INTRAVENOUS
  Filled 2021-07-01: qty 2

## 2021-07-01 MED ORDER — MORPHINE SULFATE (PF) 4 MG/ML IV SOLN
4.0000 mg | INTRAVENOUS | Status: AC | PRN
  Administered 2021-07-01 (×2): 4 mg via INTRAVENOUS
  Filled 2021-07-01 (×2): qty 1

## 2021-07-01 NOTE — Assessment & Plan Note (Addendum)
-   Weight bearing as per ortho s/p ORIF 4/3. ?- RadOnc consulted, will oversee ppx XRT to reduce risk of heterotopic ossification postoperatively. ?- Continue pain control as ordered. ?- Orthopedics to order testosterone studies, bone scan given high suspicion for osteoporosis. ?

## 2021-07-01 NOTE — ED Provider Notes (Signed)
?[facility] ?Provider Note ? ? ?CSN: [id number] ?Arrival date & time: 07/01/21  2155 ? ?  ? ?History ? ?No chief complaint on file. ? ? ?Billy [name] is a 56 y.o. male. ? ?Pt is a 56 year old with a pmhx significant for vfib cardiac arrest s/p AICD, cad, htn, hyperlipidemia, ischemia cardiomyopathy, chf, obesity and pad.  Pt said he slipped and fell and landed on his left hip.  Pt unable to get up.  No other injuries.  He is on Eliquis, but denies hitting his head.  ? ? ?  ? ?Home Medications ?Prior to Admission medications   ?Medication Sig Start Date End Date Taking? Authorizing Provider  ?amiodarone (PACERONE) 200 MG tablet Take 1 tablet (200 mg total) by mouth daily. ?Patient taking differently: Take 100 mg by mouth 2 (two) times daily. 12/08/20   Evans Lance, MD  ?apixaban (ELIQUIS) 5 MG TABS tablet Take 1 tablet (5 mg total) by mouth 2 (two) times daily. 12/09/20   Lelon Perla, MD  ?atorvastatin (LIPITOR) 80 MG tablet TAKE 1 TABLET EVERY DAY ?Patient taking differently: Take 80 mg by mouth daily. 11/29/20   Lelon Perla, MD  ?carvedilol (COREG) 12.5 MG tablet TAKE 1 TABLET TWICE DAILY WITH A MEAL (DISCONTINUE LISINOPRIL 2.'5MG'$ ) ?Patient taking differently: Take 12.5 mg by mouth 2 (two) times daily with a meal. 05/30/21   Crenshaw, Denice Bors, MD  ?clopidogrel (PLAVIX) 75 MG tablet TAKE 1 TABLET EVERY DAY ?Patient taking differently: Take 75 mg by mouth daily. 11/29/20   Lelon Perla, MD  ?ENTRESTO 24-26 MG TAKE 1 TABLET BY MOUTH TWICE DAILY ?Patient taking differently: Take 1 tablet by mouth 2 (two) times daily. 11/29/20   Lelon Perla, MD  ?ezetimibe (ZETIA) 10 MG tablet Take 1 tablet (10 mg total) by mouth daily. 12/09/20   Lelon Perla, MD  ?fluticasone Asencion Islam) 50 MCG/ACT nasal spray Place 2 sprays into both nostrils daily as needed for allergies or rhinitis. ?Patient taking differently: Place 2 sprays into both nostrils 2 (two) times daily as needed  for allergies or rhinitis. 06/11/19   Nicolette Bang, MD  ?furosemide (LASIX) 40 MG tablet TAKE 1 TABLET EVERY DAY ?Patient taking differently: Take 40 mg by mouth daily. 06/29/21   Lelon Perla, MD  ?isosorbide mononitrate (IMDUR) 30 MG 24 hr tablet Take 1 tablet (30 mg total) by mouth daily. 05/30/21   Shirley Friar, PA-C  ?levothyroxine (SYNTHROID) 50 MCG tablet Take 1 tablet (50 mcg total) by mouth daily before breakfast. 30 minutes before having breakfast. 06/06/21   [name], MD  ?montelukast (SINGULAIR) 10 MG tablet TAKE 1 TABLET EVERY DAY AS NEEDED FOR ALLERGIES ?Patient taking differently: Take 10 mg by mouth daily. 09/16/20   Camillia Herter, NP  ?Multiple Vitamin (MULTIVITAMIN WITH MINERALS) TABS tablet Take 1 tablet by mouth daily.    [provider]  ?nitroGLYCERIN (NITROSTAT) 0.4 MG SL tablet DISSOLVE 1 TABLET UNDER THE TONGUE EVERY 5 MINUTES FOR 3 DOSES AS NEEDED FOR CHEST PAIN ?Patient taking differently: Place 0.4 mg under the tongue every 5 (five) minutes as needed for chest pain. 05/16/21   Lelon Perla, MD  ?pantoprazole (PROTONIX) 40 MG tablet TAKE 1 TABLET EVERY DAY ?Patient taking differently: Take 40 mg by mouth daily. 01/03/21   Lelon Perla, MD  ?potassium chloride SA (KLOR-CON M) 20 MEQ tablet TAKE 1 TABLET EVERY DAY ?Patient taking differently: Take 20  mEq by mouth daily. 05/23/21   Lelon Perla, MD  ?   ? ?Allergies    ?Patient has no known allergies.   ? ?Review of Systems   ?Review of Systems  ?Musculoskeletal:   ?     Left hip pain  ?All other systems reviewed and are negative. ? ?Physical Exam ?Updated Vital Signs ?BP 133/66   Pulse (!) 57   Resp 10   SpO2 99%  ?Physical Exam ?Vitals and nursing note reviewed.  ?Constitutional:   ?   Appearance: Normal appearance.  ?HENT:  ?   Head: Normocephalic and atraumatic.  ?   Right Ear: External ear normal.  ?   Left Ear: External ear normal.  ?   Nose: Nose normal.  ?   Mouth/Throat:   ?   Mouth: Mucous membranes are moist.  ?   Pharynx: Oropharynx is clear.  ?Eyes:  ?   Extraocular Movements: Extraocular movements intact.  ?   Conjunctiva/sclera: Conjunctivae normal.  ?   Pupils: Pupils are equal, round, and reactive to light.  ?Cardiovascular:  ?   Rate and Rhythm: Normal rate and regular rhythm.  ?   Pulses: Normal pulses.  ?   Heart sounds: Normal heart sounds.  ?Pulmonary:  ?   Effort: Pulmonary effort is normal.  ?   Breath sounds: Normal breath sounds.  ?Abdominal:  ?   General: Abdomen is flat. Bowel sounds are normal.  ?   Palpations: Abdomen is soft.  ?Musculoskeletal:  ?   Cervical back: Normal range of motion and neck supple.  ?   Left hip: Deformity and tenderness present. Decreased range of motion.  ?Skin: ?   General: Skin is warm.  ?   Capillary Refill: Capillary refill takes less than 2 seconds.  ?Neurological:  ?   General: No focal deficit present.  ?   Mental Status: He is alert and oriented to person, place, and time.  ?Psychiatric:     ?   Mood and Affect: Mood normal.     ?   Behavior: Behavior normal.  ? ? ?ED Results / Procedures / Treatments   ?Labs ?(all labs ordered are listed, but only abnormal results are displayed) ?Labs Reviewed  ?BASIC METABOLIC PANEL - Abnormal; Notable for the following components:  ?    Result Value  ? Creatinine, Ser 1.41 (*)   ? GFR, Estimated 59 (*)   ? All other components within normal limits  ?CBC WITH DIFFERENTIAL/PLATELET - Abnormal; Notable for the following components:  ? WBC 11.5 (*)   ? Neutro Abs 8.1 (*)   ? All other components within normal limits  ?PROTIME-INR  ?TYPE AND SCREEN  ?ABO/RH  ? ? ?EKG ?EKG Interpretation ? ?Date/Time:  Friday July 01 2021 22:01:52 EDT ?Ventricular Rate:  56 ?PR Interval:  152 ?QRS Duration: 93 ?QT Interval:  452 ?QTC Calculation: 437 ?R Axis:   61 ?Text Interpretation: Sinus rhythm Inferior infarct, old No significant change since last tracing Confirmed by Isla Pence [phone]) on 07/01/2021  11:18:50 PM ? ?Radiology ?DG Chest 1 View ? ?Result Date: 07/01/2021 ?CLINICAL DATA:  Hip fracture.  Fall. EXAM: CHEST  1 VIEW COMPARISON:  05/10/2020 FINDINGS: Cardiac pacemaker. Postoperative changes in the mediastinum. Cardiac enlargement. No vascular congestion, edema, or consolidation. No pleural effusions. No pneumothorax. Mediastinal contours appear intact. Visualized ribs are nondisplaced. IMPRESSION: Shallow inspiration. Cardiac enlargement. No evidence of active pulmonary disease. Electronically Signed   By: Oren Beckmann.D.  On: 07/01/2021 22:51  ? ?DG Knee Complete 4 Views Left ? ?Result Date: 07/01/2021 ?CLINICAL DATA:  Fall. EXAM: LEFT KNEE - COMPLETE 4+ VIEW COMPARISON:  None. FINDINGS: No evidence of fracture, dislocation, or joint effusion. No evidence of arthropathy or other focal bone abnormality. Soft tissues peripheral vascular calcifications are present. Vascular stent is noted in the site. IMPRESSION: No acute bony abnormality. Electronically Signed   By: Ronney Asters M.D.   On: 07/01/2021 22:50  ? ?[facility] [place] ? ?Result Date: 06/30/2021 ?Scheduled remote reviewed. Normal device function.  Next remote 91 days. [place] ? ?DG Hip Unilat With Pelvis 2-3 Views Left ? ?Result Date: 07/01/2021 ?CLINICAL DATA:  Fall. EXAM: DG HIP (WITH OR WITHOUT PELVIS) 2-3V LEFT COMPARISON:  None. FINDINGS: There is superolateral dislocation of the humeral head. There is a comminuted fracture of the superior acetabulum with fracture fragments displaced superiorly and laterally. Soft tissues are grossly within normal limits. IMPRESSION: 1. Dislocated left hip. 2. Comminuted fracture superior left acetabulum. Electronically Signed   By: Ronney Asters M.D.   On: 07/01/2021 22:48   ? ?Procedures ?Procedures  ? ? ?Medications Ordered in ED ?Medications  ?0.9 %  sodium chloride infusion ( Intravenous New Bag/Given 07/01/21 2238)  ?morphine (PF) 4 MG/ML injection 4 mg (4 mg Intravenous Given 07/01/21 2350)   ?ondansetron [facility]) injection 4 mg (4 mg Intravenous Given 07/01/21 2219)  ? ? ?ED Course/ Medical Decision Making/ A&P ?  ?                        ?Medical Decision Making ?Amount and/or Complexity of Dat

## 2021-07-01 NOTE — H&P (Signed)
?History and Physical  ? ? ?Billy [name] YYT:[id number] DOB: [birth year] DOA: 07/01/2021 ? ?DOS: the patient was seen and examined on 07/01/2021 ? ?PCP: [name], MD  ? ?Patient coming from: Home ? ?I have personally briefly reviewed patient's old medical records in [place] ? ?CC: fall at home ?HPI: ?56 year old African-American male history of hypertension, history of sudden cardiac death status post ICD, reflux, obesity, history of coronary disease status post quad bypass, history of A-fib on Eliquis presents to the ER today after a fall at home.  Patient states that he was wearing sandals.  He was walking on wet grass on a hill.  He slipped.  He states that his right foot went forward and his left foot went backwards.  Landed on the left side.  He states he was unable to stand due to pain in the left side.  Came to the ER. ? ?In the ER, heart rate 57, blood pressure 116/71 satting ? ?Labs: ?White count 11.5, hemoglobin 16, platelets 271 ? ?Sodium 141, potassium 3.8 chloride 109 BUN of 13, creatinine 1.4 ? ?Pelvic x-rays show dislocated left hip and a comminuted fracture of the left acetabulum. ? ?EDP is consulted orthopedics. ? ?[facility] hospitalist contacted for admission.  ? ?ED Course: pelvic xr show dislocated left hip and left acetabular fracture. ? ?Review of Systems:  ?Review of Systems  ?Constitutional: Negative.   ?HENT: Negative.    ?Eyes: Negative.   ?Respiratory: Negative.    ?Cardiovascular: Negative.   ?Gastrointestinal: Negative.   ?Genitourinary: Negative.   ?Musculoskeletal:  Positive for joint pain.  ?     Left hip pain  ?Skin: Negative.   ?Neurological: Negative.   ?Endo/Heme/Allergies: Negative.   ?Psychiatric/Behavioral: Negative.    ?All other systems reviewed and are negative. ? ?Past Medical History:  ?Diagnosis Date  ? Acute on chronic heart failure ([place]) 06/01/2020  ? Aortic atherosclerosis ([place])   ? on CXR  ? Blood in stool   ? C. difficile colitis   ? a. remote hx 2010.   ? Cardiac arrest - ventricular fibrillation 02/11/2009  ? a. 02/2009 s/p St Jude ICD.  ? Chronic systolic CHF (congestive heart failure) ([place])   ? Coronary artery disease   ? a. PCI to Cx age 18. b. CABGx4 in 2003. c. BMS to [place] in 12/2007.  ? Former tobacco use   ? Hyperlipidemia   ? Hypertension   ? Ischemic cardiomyopathy   ? Marijuana abuse   ? Myocardial infarct [facility])   ? NON-ST-SEGMENT ELEVATION MI  ? Obesity   ? PAD (peripheral artery disease) ([place])   ? a. RLE by noninvasive testing - managed medically for now.  ? Ventricular tachyarrhythmia   ? Ventricular tachycardia   ? ? ?Past Surgical History:  ?Procedure Laterality Date  ? ABDOMINAL AORTOGRAM W/LOWER EXTREMITY N/A 05/14/2019  ? Procedure: ABDOMINAL AORTOGRAM W/LOWER EXTREMITY;  Surgeon: Wellington Hampshire, MD;  [facility];  Service: Cardiovascular;  Laterality: N/A;  ? CARDIAC DEFIBRILLATOR PLACEMENT    ? STJ single chamber ICD implanted for secondary prevention  ? CIRCUMCISION    ? CORONARY ARTERY BYPASS GRAFT  06/17/01  ? X 4  ? CORONARY BALLOON ANGIOPLASTY N/A 01/22/2019  ? Procedure: CORONARY BALLOON ANGIOPLASTY;  Surgeon: Leonie Man, MD;  Location: [facility];  Service: Cardiovascular;  Laterality: N/A;  ? ICD GENERATOR CHANGEOUT N/A 06/17/2021  ? Procedure: ICD GENERATOR CHANGEOUT;  Surgeon: Deboraha Sprang, MD;  Location:  [facility];  Service: Cardiovascular;  Laterality: N/A;  ? LEFT HEART CATH AND CORS/GRAFTS ANGIOGRAPHY N/A 01/22/2019  ? Procedure: LEFT HEART CATH AND CORS/GRAFTS ANGIOGRAPHY;  Surgeon: Leonie Man, MD;  Location: [facility];  Service: Cardiovascular;  Laterality: N/A;  ? LEFT HEART CATH AND CORS/GRAFTS ANGIOGRAPHY N/A 05/18/2020  ? Procedure: LEFT HEART CATH AND CORS/GRAFTS ANGIOGRAPHY;  Surgeon: Troy Sine, MD;  Location: [facility];  Service: Cardiovascular;  Laterality: N/A;  ? PERIPHERAL VASCULAR INTERVENTION Right 05/14/2019  ? Procedure: PERIPHERAL VASCULAR  INTERVENTION;  Surgeon: Wellington Hampshire, MD;  Location: [facility];  Service: Cardiovascular;  Laterality: Right;  ? SVT ABLATION N/A 06/21/2020  ? Procedure: SVT ABLATION;  Surgeon: Evans Lance, MD;  Location: [facility];  Service: Cardiovascular;  Laterality: N/A;  ? ? ? reports that he quit smoking about 18 years ago. His smoking use included cigarettes. He has a 40.00 pack-year smoking history. He has never been exposed to tobacco smoke. He has never used smokeless tobacco. He reports that he does not drink alcohol and does not use drugs. ? ?No Known Allergies ? ?Family History  ?Problem Relation Age of Onset  ? Hypertension Mother   ? Heart attack Father   ?     DIED AT 85 FROM MI  ? Heart disease Sister   ?     CAD AND PREVIOUS CABG  ? Hypertension Other   ?     IN MOST OF HIS SIBLINGS  ? ? ?Prior to Admission medications   ?Medication Sig Start Date End Date Taking? Authorizing Provider  ?apixaban (ELIQUIS) 5 MG TABS tablet Take 1 tablet (5 mg total) by mouth 2 (two) times daily. 12/09/20  Yes Lelon Perla, MD  ?atorvastatin (LIPITOR) 80 MG tablet TAKE 1 TABLET EVERY DAY ?Patient taking differently: Take 80 mg by mouth daily. 11/29/20  Yes Lelon Perla, MD  ?carvedilol (COREG) 12.5 MG tablet TAKE 1 TABLET TWICE DAILY WITH A MEAL (DISCONTINUE LISINOPRIL 2.'5MG'$ ) ?Patient taking differently: Take 12.5 mg by mouth 2 (two) times daily with a meal. 05/30/21  Yes Crenshaw, Denice Bors, MD  ?clopidogrel (PLAVIX) 75 MG tablet TAKE 1 TABLET EVERY DAY ?Patient taking differently: Take 75 mg by mouth daily. 11/29/20  Yes Crenshaw, Denice Bors, MD  ?ENTRESTO 24-26 MG TAKE 1 TABLET BY MOUTH TWICE DAILY ?Patient taking differently: Take 1 tablet by mouth 2 (two) times daily. 11/29/20  Yes Lelon Perla, MD  ?ezetimibe (ZETIA) 10 MG tablet Take 1 tablet (10 mg total) by mouth daily. 12/09/20  Yes Lelon Perla, MD  ?fluticasone (FLONASE) 50 MCG/ACT nasal spray Place 2 sprays into both nostrils daily as  needed for allergies or rhinitis. ?Patient taking differently: Place 2 sprays into both nostrils 2 (two) times daily as needed for allergies or rhinitis. 06/11/19  Yes Nicolette Bang, MD  ?furosemide (LASIX) 40 MG tablet TAKE 1 TABLET EVERY DAY ?Patient taking differently: Take 40 mg by mouth daily. 06/29/21  Yes Lelon Perla, MD  ?isosorbide mononitrate (IMDUR) 30 MG 24 hr tablet Take 1 tablet (30 mg total) by mouth daily. 05/30/21  Yes Shirley Friar, PA-C  ?levothyroxine (SYNTHROID) 50 MCG tablet Take 1 tablet (50 mcg total) by mouth daily before breakfast. 30 minutes before having breakfast. 06/06/21  Yes [name], MD  ?montelukast (SINGULAIR) 10 MG tablet TAKE 1 TABLET EVERY DAY AS NEEDED FOR ALLERGIES ?Patient taking differently: Take  10 mg by mouth daily. 09/16/20  Yes Camillia Herter, NP  ?Multiple Vitamin (MULTIVITAMIN WITH MINERALS) TABS tablet Take 1 tablet by mouth daily.   Yes [provider]  ?nitroGLYCERIN (NITROSTAT) 0.4 MG SL tablet DISSOLVE 1 TABLET UNDER THE TONGUE EVERY 5 MINUTES FOR 3 DOSES AS NEEDED FOR CHEST PAIN ?Patient taking differently: Place 0.4 mg under the tongue every 5 (five) minutes as needed for chest pain. 05/16/21  Yes Lelon Perla, MD  ?pantoprazole (PROTONIX) 40 MG tablet TAKE 1 TABLET EVERY DAY ?Patient taking differently: Take 40 mg by mouth daily. 01/03/21  Yes Lelon Perla, MD  ?potassium chloride SA (KLOR-CON M) 20 MEQ tablet TAKE 1 TABLET EVERY DAY ?Patient taking differently: Take 20 mEq by mouth daily. 05/23/21  Yes Lelon Perla, MD  ?amiodarone (PACERONE) 200 MG tablet Take 1 tablet (200 mg total) by mouth daily. ?Patient not taking: Reported on 07/01/2021 12/08/20   Evans Lance, MD  ? ? ?Physical Exam: ?Vitals:  ? 07/01/21 2215 07/01/21 2245 07/01/21 2300 07/01/21 2315  ?BP: 116/71 (!) 133/117 110/68 133/66  ?Pulse: (!) 57     ?Resp: '13 14 13 10  '$ ?SpO2: 99%     ? ? ?Physical Exam ?Vitals and nursing note reviewed.   ?Constitutional:   ?   General: He is not in acute distress. ?   Appearance: Normal appearance. He is obese. He is not ill-appearing, toxic-appearing or diaphoretic.  ?HENT:  ?   Head: Normocephalic an

## 2021-07-01 NOTE — Assessment & Plan Note (Signed)
-   Continue home meds °

## 2021-07-01 NOTE — Assessment & Plan Note (Addendum)
Continue PPI ?

## 2021-07-01 NOTE — Subjective & Objective (Signed)
CC: fall at home ?HPI: ?56 year old African-American male history of hypertension, history of sudden cardiac death status post ICD, reflux, obesity, history of coronary disease status post quad bypass, history of A-fib on Eliquis presents to the ER today after a fall at home.  Patient states that he was wearing sandals.  He was walking on wet grass on a hill.  He slipped.  He states that his right foot went forward and his left foot went backwards.  Landed on the left side.  He states he was unable to stand due to pain in the left side.  Came to the ER. ? ?In the ER, heart rate 57, blood pressure 116/71 satting ? ?Labs: ?White count 11.5, hemoglobin 16, platelets 271 ? ?Sodium 141, potassium 3.8 chloride 109 BUN of 13, creatinine 1.4 ? ?Pelvic x-rays show dislocated left hip and a comminuted fracture of the left acetabulum. ? ?EDP is consulted orthopedics. ? ?[facility] hospitalist contacted for admission. ?

## 2021-07-01 NOTE — Assessment & Plan Note (Addendum)
S/p closed reduction in OR with sedation 3/31 with subsequent subluxation. Per ortho. ?

## 2021-07-01 NOTE — Consult Note (Signed)
Reason for Consult: Evaluate left hip injury ?Referring Physician: Dr. Gilford Raid ? ?Billy [name] is an 56 y.o. male.  ?HPI: 56 year old male slipped on wet grass tonight.  He was unable to ambulate with severe left hip pain. ? ?Past Medical History:  ?Diagnosis Date  ? Aortic atherosclerosis ([place])   ? on CXR  ? Blood in stool   ? C. difficile colitis   ? a. remote hx 2010.  ? Cardiac arrest - ventricular fibrillation 02/11/2009  ? a. 02/2009 s/p St Jude ICD.  ? Chronic systolic CHF (congestive heart failure) ([place])   ? Coronary artery disease   ? a. PCI to Cx age 34. b. CABGx4 in 2003. c. BMS to [place] in 12/2007.  ? Former tobacco use   ? Hyperlipidemia   ? Hypertension   ? Ischemic cardiomyopathy   ? Marijuana abuse   ? Myocardial infarct [facility])   ? NON-ST-SEGMENT ELEVATION MI  ? Obesity   ? PAD (peripheral artery disease) ([place])   ? a. RLE by noninvasive testing - managed medically for now.  ? Ventricular tachyarrhythmia   ? Ventricular tachycardia   ? ? ?Past Surgical History:  ?Procedure Laterality Date  ? ABDOMINAL AORTOGRAM W/LOWER EXTREMITY N/A 05/14/2019  ? Procedure: ABDOMINAL AORTOGRAM W/LOWER EXTREMITY;  Surgeon: Wellington Hampshire, MD;  Location: [place] [facility];  Service: Cardiovascular;  Laterality: N/A;  ? CARDIAC DEFIBRILLATOR PLACEMENT    ? STJ single chamber ICD implanted for secondary prevention  ? CIRCUMCISION    ? CORONARY ARTERY BYPASS GRAFT  06/17/01  ? X 4  ? CORONARY BALLOON ANGIOPLASTY N/A 01/22/2019  ? Procedure: CORONARY BALLOON ANGIOPLASTY;  Surgeon: Leonie Man, MD;  Location: [place] [facility];  Service: Cardiovascular;  Laterality: N/A;  ? ICD GENERATOR CHANGEOUT N/A 06/17/2021  ? Procedure: ICD GENERATOR CHANGEOUT;  Surgeon: Deboraha Sprang, MD;  Location: [place] [facility];  Service: Cardiovascular;  Laterality: N/A;  ? LEFT HEART CATH AND CORS/GRAFTS ANGIOGRAPHY N/A 01/22/2019  ? Procedure: LEFT HEART CATH AND CORS/GRAFTS ANGIOGRAPHY;  Surgeon: Leonie Man, MD;  Location: [place] [facility];  Service: Cardiovascular;  Laterality: N/A;  ? LEFT HEART CATH AND CORS/GRAFTS ANGIOGRAPHY N/A 05/18/2020  ? Procedure: LEFT HEART CATH AND CORS/GRAFTS ANGIOGRAPHY;  Surgeon: Troy Sine, MD;  Location: [place] [facility];  Service: Cardiovascular;  Laterality: N/A;  ? PERIPHERAL VASCULAR INTERVENTION Right 05/14/2019  ? Procedure: PERIPHERAL VASCULAR INTERVENTION;  Surgeon: Wellington Hampshire, MD;  Location: [place] [facility];  Service: Cardiovascular;  Laterality: Right;  ? SVT ABLATION N/A 06/21/2020  ? Procedure: SVT ABLATION;  Surgeon: Evans Lance, MD;  Location: [place] [facility];  Service: Cardiovascular;  Laterality: N/A;  ? ? ?Family History  ?Problem Relation Age of Onset  ? Hypertension Mother   ? Heart attack Father   ?     DIED AT 50 FROM MI  ? Heart disease Sister   ?     CAD AND PREVIOUS CABG  ? Hypertension Other   ?     IN MOST OF HIS SIBLINGS  ? ? ?Social History:  reports that he quit smoking about 18 years ago. His smoking use included cigarettes. He has a 40.00 pack-year smoking history. He has never been exposed to tobacco smoke. He has never used smokeless tobacco. He reports that he does not drink alcohol and does not use drugs. ? ?Allergies: No Known Allergies ? ?Medications: I have reviewed the patient's current medications. ? ?  Results for orders placed or performed during the hospital encounter of 07/01/21 (from the past 48 hour(s))  ?ABO/Rh     Status: None (Preliminary result)  ? Collection Time: 07/01/21 10:25 PM  ?Result Value Ref Range  ? ABO/RH(D) PENDING   ?Basic metabolic panel     Status: Abnormal  ? Collection Time: 07/01/21 10:40 PM  ?Result Value Ref Range  ? Sodium 141 135 - 145 mmol/L  ? Potassium 3.8 3.5 - 5.1 mmol/L  ? Chloride 109 98 - 111 mmol/L  ? CO2 24 22 - 32 mmol/L  ? Glucose, Bld 93 70 - 99 mg/dL  ?  Comment: Glucose reference range applies only to samples taken after fasting for at least 8 hours.  ? BUN 13 6 - 20 mg/dL  ? Creatinine, Ser 1.41  (H) 0.61 - 1.24 mg/dL  ? Calcium 9.0 8.9 - 10.3 mg/dL  ? GFR, Estimated 59 (L) >60 mL/min  ?  Comment: (NOTE) ?Calculated using the CKD-EPI Creatinine Equation (2021) ?  ? Anion gap 8 5 - 15  ?  Comment: Performed at [facility], [place] [address] [place], [place] [address]  ?CBC WITH DIFFERENTIAL     Status: Abnormal  ? Collection Time: 07/01/21 10:40 PM  ?Result Value Ref Range  ? WBC 11.5 (H) 4.0 - 10.5 K/uL  ? RBC 5.74 4.22 - 5.81 MIL/uL  ? Hemoglobin 16.0 13.0 - 17.0 g/dL  ? HCT 47.1 39.0 - 52.0 %  ? MCV 82.1 80.0 - 100.0 fL  ? MCH 27.9 26.0 - 34.0 pg  ? MCHC 34.0 30.0 - 36.0 g/dL  ? RDW 14.8 11.5 - 15.5 %  ? Platelets 271 150 - 400 K/uL  ? nRBC 0.0 0.0 - 0.2 %  ? Neutrophils Relative % 70 %  ? Neutro Abs 8.1 (H) 1.7 - 7.7 K/uL  ? Lymphocytes Relative 18 %  ? Lymphs Abs 2.0 0.7 - 4.0 K/uL  ? Monocytes Relative 9 %  ? Monocytes Absolute 1.0 0.1 - 1.0 K/uL  ? Eosinophils Relative 2 %  ? Eosinophils Absolute 0.2 0.0 - 0.5 K/uL  ? Basophils Relative 1 %  ? Basophils Absolute 0.1 0.0 - 0.1 K/uL  ? Immature Granulocytes 0 %  ? Abs Immature Granulocytes 0.05 0.00 - 0.07 K/uL  ?  Comment: Performed at [facility], [place] [address] [place], [place] [address]  ?Protime-INR     Status: None  ? Collection Time: 07/01/21 10:40 PM  ?Result Value Ref Range  ? Prothrombin Time 14.8 11.4 - 15.2 seconds  ? INR 1.2 0.8 - 1.2  ?  Comment: (NOTE) ?INR goal varies based on device and disease states. ?Performed at [facility], [place] [address], [place], [place] ?[address] ?  ?Type and screen [place]     Status: None (Preliminary result)  ? Collection Time: 07/01/21 10:40 PM  ?Result Value Ref Range  ? ABO/RH(D) PENDING   ? Antibody Screen PENDING   ? Sample Expiration    ?  07/04/2021,2359 ?Performed at [facility], [place] [address] [place], [place] [address] ?  ? ? ?DG Chest 1 View ? ?Result Date: 07/01/2021 ?CLINICAL DATA:  Hip fracture.  Fall. EXAM: CHEST  1 VIEW COMPARISON:  05/10/2020  FINDINGS: Cardiac pacemaker. Postoperative changes in the mediastinum. Cardiac enlargement. No vascular congestion, edema, or consolidation. No pleural effusions. No pneumothorax. Mediastinal contours appear intact. Visualized ribs are nondisplaced. IMPRESSION: Shallow inspiration. Cardiac enlargement.  No evidence of active pulmonary disease. Electronically Signed   By: Lucienne Capers M.D.   On: 07/01/2021 22:51  ? ?DG Knee Complete 4 Views Left ? ?Result Date: 07/01/2021 ?CLINICAL DATA:  Fall. EXAM: LEFT KNEE - COMPLETE 4+ VIEW COMPARISON:  None. FINDINGS: No evidence of fracture, dislocation, or joint effusion. No evidence of arthropathy or other focal bone abnormality. Soft tissues peripheral vascular calcifications are present. Vascular stent is noted in the site. IMPRESSION: No acute bony abnormality. Electronically Signed   By: Ronney Asters M.D.   On: 07/01/2021 22:50  ? ?[place] ? ?Result Date: 06/30/2021 ?Scheduled remote reviewed. Normal device function.  Next remote 91 days. LA ? ?DG Hip Unilat With Pelvis 2-3 Views Left ? ?Result Date: 07/01/2021 ?CLINICAL DATA:  Fall. EXAM: DG HIP (WITH OR WITHOUT PELVIS) 2-3V LEFT COMPARISON:  None. FINDINGS: There is superolateral dislocation of the humeral head. There is a comminuted fracture of the superior acetabulum with fracture fragments displaced superiorly and laterally. Soft tissues are grossly within normal limits. IMPRESSION: 1. Dislocated left hip. 2. Comminuted fracture superior left acetabulum. Electronically Signed   By: Ronney Asters M.D.   On: 07/01/2021 22:48   ? ?Review of Systems  ?All other systems reviewed and are negative. ?Blood pressure 116/71, pulse (!) 57, resp. rate 13, SpO2 99 %. ?Physical Exam ?Constitutional:   ?   Appearance: He is well-developed.  ?HENT:  ?   Head: Atraumatic.  ?Eyes:  ?   Extraocular Movements: Extraocular movements intact.  ?Cardiovascular:  ?   Pulses: Normal pulses.  ?Pulmonary:  ?   Effort:  Pulmonary effort is normal.  ?Musculoskeletal:  ?   Comments: Left lower extremity shortened and externally rotated.  He is neurovascularly intact distally.  No knee tenderness.  Right lower extremity

## 2021-07-01 NOTE — Assessment & Plan Note (Addendum)
In NSR, s/p SVT ablation 2022. ?- Continue coreg, eliquis (restarted POD #1) ?

## 2021-07-01 NOTE — Assessment & Plan Note (Addendum)
CAD s/p remote CABGx4 2003, BMS in 2009 since then. He denies current or recent angina.  ?- Continue home BB, imdur, statin, zetia. ?- Restart plavix POD#1 ?

## 2021-07-01 NOTE — Assessment & Plan Note (Deleted)
Chronic. 

## 2021-07-01 NOTE — Assessment & Plan Note (Addendum)
Stable. ?- Avoid nephrotoxins ?

## 2021-07-01 NOTE — Assessment & Plan Note (Addendum)
Recent generator exchange. No shocks recently. Interrogated per cardiology. ?

## 2021-07-02 ENCOUNTER — Encounter (HOSPITAL_COMMUNITY): Payer: Self-pay | Admitting: Internal Medicine

## 2021-07-02 ENCOUNTER — Inpatient Hospital Stay (HOSPITAL_COMMUNITY): Payer: Medicare HMO | Admitting: Anesthesiology

## 2021-07-02 ENCOUNTER — Other Ambulatory Visit: Payer: Self-pay

## 2021-07-02 ENCOUNTER — Inpatient Hospital Stay (HOSPITAL_COMMUNITY): Payer: Medicare HMO

## 2021-07-02 ENCOUNTER — Encounter (HOSPITAL_COMMUNITY)
Admission: EM | Disposition: A | Discharge status: Home Health | Payer: Self-pay | Source: Home / Self Care | Attending: Family Medicine

## 2021-07-02 DIAGNOSIS — I10 Essential (primary) hypertension: Secondary | ICD-10-CM

## 2021-07-02 DIAGNOSIS — Z951 Presence of aortocoronary bypass graft: Secondary | ICD-10-CM

## 2021-07-02 DIAGNOSIS — Z95810 Presence of automatic (implantable) cardiac defibrillator: Secondary | ICD-10-CM

## 2021-07-02 DIAGNOSIS — I252 Old myocardial infarction: Secondary | ICD-10-CM

## 2021-07-02 DIAGNOSIS — I5022 Chronic systolic (congestive) heart failure: Secondary | ICD-10-CM

## 2021-07-02 DIAGNOSIS — S73005A Unspecified dislocation of left hip, initial encounter: Secondary | ICD-10-CM | POA: Diagnosis not present

## 2021-07-02 DIAGNOSIS — E039 Hypothyroidism, unspecified: Secondary | ICD-10-CM

## 2021-07-02 DIAGNOSIS — S32402A Unspecified fracture of left acetabulum, initial encounter for closed fracture: Secondary | ICD-10-CM | POA: Diagnosis not present

## 2021-07-02 DIAGNOSIS — K219 Gastro-esophageal reflux disease without esophagitis: Secondary | ICD-10-CM

## 2021-07-02 DIAGNOSIS — I110 Hypertensive heart disease with heart failure: Secondary | ICD-10-CM

## 2021-07-02 DIAGNOSIS — I2510 Atherosclerotic heart disease of native coronary artery without angina pectoris: Secondary | ICD-10-CM

## 2021-07-02 DIAGNOSIS — N1831 Chronic kidney disease, stage 3a: Secondary | ICD-10-CM

## 2021-07-02 DIAGNOSIS — I480 Paroxysmal atrial fibrillation: Secondary | ICD-10-CM

## 2021-07-02 DIAGNOSIS — S72002A Fracture of unspecified part of neck of left femur, initial encounter for closed fracture: Secondary | ICD-10-CM

## 2021-07-02 HISTORY — PX: HIP CLOSED REDUCTION: SHX983

## 2021-07-02 SURGERY — CLOSED REDUCTION, HIP
Anesthesia: General | Site: Hip | Laterality: Left

## 2021-07-02 MED ORDER — ONDANSETRON HCL 4 MG/2ML IJ SOLN: 4.0000 mg | Freq: Four times a day (QID) | INTRAMUSCULAR | Status: DC | PRN

## 2021-07-02 MED ORDER — PHENYLEPHRINE HCL (PRESSORS) 10 MG/ML IV SOLN
INTRAVENOUS | Status: DC | PRN
  Administered 2021-07-02 (×3): 80 ug via INTRAVENOUS

## 2021-07-02 MED ORDER — OXYCODONE HCL 5 MG PO TABS: 5.0000 mg | ORAL_TABLET | Freq: Once | ORAL | Status: DC | PRN

## 2021-07-02 MED ORDER — PANTOPRAZOLE SODIUM 40 MG PO TBEC
40.0000 mg | DELAYED_RELEASE_TABLET | Freq: Every day | ORAL | Status: DC
  Administered 2021-07-02 – 2021-07-08 (×7): 40 mg via ORAL
  Filled 2021-07-02 (×7): qty 1

## 2021-07-02 MED ORDER — FENTANYL CITRATE (PF) 100 MCG/2ML IJ SOLN
INTRAMUSCULAR | Status: AC
  Filled 2021-07-02: qty 2

## 2021-07-02 MED ORDER — HYDROMORPHONE HCL 2 MG PO TABS: 4.0000 mg | ORAL_TABLET | ORAL | Status: DC | PRN

## 2021-07-02 MED ORDER — MORPHINE SULFATE (PF) 4 MG/ML IV SOLN
4.0000 mg | INTRAVENOUS | Status: DC | PRN
  Administered 2021-07-02: 4 mg via INTRAVENOUS
  Filled 2021-07-02: qty 1

## 2021-07-02 MED ORDER — PROPOFOL 10 MG/ML IV BOLUS
INTRAVENOUS | Status: AC
Start: 2021-07-02 — End: ?
  Filled 2021-07-02: qty 20

## 2021-07-02 MED ORDER — OXYCODONE HCL 5 MG/5ML PO SOLN: 5.0000 mg | Freq: Once | ORAL | Status: DC | PRN

## 2021-07-02 MED ORDER — MIDAZOLAM HCL 2 MG/2ML IJ SOLN
INTRAMUSCULAR | Status: AC
  Filled 2021-07-02: qty 2

## 2021-07-02 MED ORDER — PROPOFOL 10 MG/ML IV BOLUS
INTRAVENOUS | Status: AC
  Filled 2021-07-02: qty 20

## 2021-07-02 MED ORDER — FENTANYL CITRATE (PF) 250 MCG/5ML IJ SOLN
INTRAMUSCULAR | Status: AC
  Filled 2021-07-02: qty 5

## 2021-07-02 MED ORDER — LIDOCAINE HCL (CARDIAC) PF 100 MG/5ML IV SOSY
PREFILLED_SYRINGE | INTRAVENOUS | Status: DC | PRN
  Administered 2021-07-02: 50 mg via INTRAVENOUS

## 2021-07-02 MED ORDER — HYDROMORPHONE HCL 1 MG/ML IJ SOLN
1.0000 mg | INTRAMUSCULAR | Status: DC | PRN
Start: 2021-07-02 — End: 2021-07-04
  Administered 2021-07-02 – 2021-07-03 (×9): 1 mg via INTRAVENOUS
  Filled 2021-07-02 (×9): qty 1

## 2021-07-02 MED ORDER — ATORVASTATIN CALCIUM 80 MG PO TABS
80.0000 mg | ORAL_TABLET | Freq: Every day | ORAL | Status: DC
  Administered 2021-07-02 – 2021-07-08 (×7): 80 mg via ORAL
  Filled 2021-07-02 (×7): qty 1

## 2021-07-02 MED ORDER — DOCUSATE SODIUM 100 MG PO CAPS
100.0000 mg | ORAL_CAPSULE | Freq: Two times a day (BID) | ORAL | Status: DC
  Administered 2021-07-03 (×2): 100 mg via ORAL
  Filled 2021-07-02 (×4): qty 1

## 2021-07-02 MED ORDER — FENTANYL CITRATE (PF) 100 MCG/2ML IJ SOLN
25.0000 ug | INTRAMUSCULAR | Status: DC | PRN
  Administered 2021-07-02 (×2): 50 ug via INTRAVENOUS

## 2021-07-02 MED ORDER — MORPHINE SULFATE (PF) 4 MG/ML IV SOLN
4.0000 mg | Freq: Once | INTRAVENOUS | Status: AC
Start: 2021-07-02 — End: 2021-07-02
  Administered 2021-07-02: 4 mg via INTRAVENOUS
  Filled 2021-07-02: qty 1

## 2021-07-02 MED ORDER — EZETIMIBE 10 MG PO TABS
10.0000 mg | ORAL_TABLET | Freq: Every day | ORAL | Status: DC
  Administered 2021-07-02 – 2021-07-08 (×7): 10 mg via ORAL
  Filled 2021-07-02 (×7): qty 1

## 2021-07-02 MED ORDER — ONDANSETRON HCL 4 MG/2ML IJ SOLN
4.0000 mg | Freq: Once | INTRAMUSCULAR | Status: AC
  Administered 2021-07-02: 4 mg via INTRAVENOUS
  Filled 2021-07-02: qty 2

## 2021-07-02 MED ORDER — ISOSORBIDE MONONITRATE ER 30 MG PO TB24
30.0000 mg | ORAL_TABLET | Freq: Every day | ORAL | Status: DC
  Administered 2021-07-02 – 2021-07-05 (×4): 30 mg via ORAL
  Filled 2021-07-02 (×4): qty 1

## 2021-07-02 MED ORDER — ONDANSETRON HCL 4 MG PO TABS: 4.0000 mg | ORAL_TABLET | Freq: Four times a day (QID) | ORAL | Status: DC | PRN

## 2021-07-02 MED ORDER — CARVEDILOL 12.5 MG PO TABS
12.5000 mg | ORAL_TABLET | Freq: Two times a day (BID) | ORAL | Status: DC
  Administered 2021-07-02 – 2021-07-04 (×6): 12.5 mg via ORAL
  Filled 2021-07-02 (×6): qty 1

## 2021-07-02 MED ORDER — ONDANSETRON HCL 4 MG/2ML IJ SOLN
4.0000 mg | Freq: Four times a day (QID) | INTRAMUSCULAR | Status: DC | PRN
  Administered 2021-07-02 – 2021-07-05 (×4): 4 mg via INTRAVENOUS
  Filled 2021-07-02 (×5): qty 2

## 2021-07-02 MED ORDER — SUCCINYLCHOLINE 20MG/ML (10ML) SYRINGE FOR MEDFUSION PUMP - OPTIME
INTRAMUSCULAR | Status: DC | PRN
  Administered 2021-07-02: 140 mg via INTRAVENOUS

## 2021-07-02 MED ORDER — DOCUSATE SODIUM 100 MG PO CAPS: 100.0000 mg | ORAL_CAPSULE | Freq: Two times a day (BID) | ORAL | Status: DC

## 2021-07-02 MED ORDER — PROPOFOL 10 MG/ML IV BOLUS
INTRAVENOUS | Status: DC | PRN
Start: 2021-07-02 — End: 2021-07-02
  Administered 2021-07-02: 200 mg via INTRAVENOUS

## 2021-07-02 MED ORDER — MIDAZOLAM HCL 2 MG/2ML IJ SOLN
INTRAMUSCULAR | Status: DC | PRN
  Administered 2021-07-02: 2 mg via INTRAVENOUS

## 2021-07-02 MED ORDER — POTASSIUM CHLORIDE CRYS ER 20 MEQ PO TBCR
20.0000 meq | EXTENDED_RELEASE_TABLET | Freq: Every day | ORAL | Status: DC
  Administered 2021-07-02 – 2021-07-08 (×7): 20 meq via ORAL
  Filled 2021-07-02 (×7): qty 1

## 2021-07-02 MED ORDER — LEVOTHYROXINE SODIUM 50 MCG PO TABS
50.0000 ug | ORAL_TABLET | Freq: Every day | ORAL | Status: DC
  Administered 2021-07-02 – 2021-07-08 (×7): 50 ug via ORAL
  Filled 2021-07-02 (×7): qty 1

## 2021-07-02 MED ORDER — ACETAMINOPHEN 650 MG RE SUPP: 650.0000 mg | Freq: Four times a day (QID) | RECTAL | Status: DC | PRN

## 2021-07-02 MED ORDER — FUROSEMIDE 40 MG PO TABS
40.0000 mg | ORAL_TABLET | Freq: Every day | ORAL | Status: DC
  Administered 2021-07-02 – 2021-07-08 (×6): 40 mg via ORAL
  Filled 2021-07-02 (×6): qty 1

## 2021-07-02 MED ORDER — MONTELUKAST SODIUM 10 MG PO TABS
10.0000 mg | ORAL_TABLET | Freq: Every day | ORAL | Status: DC
Start: 2021-07-02 — End: 2021-07-08
  Administered 2021-07-02 – 2021-07-08 (×7): 10 mg via ORAL
  Filled 2021-07-02 (×7): qty 1

## 2021-07-02 MED ORDER — OXYCODONE HCL 5 MG PO TABS: 5.0000 mg | ORAL_TABLET | ORAL | Status: DC | PRN

## 2021-07-02 MED ORDER — ACETAMINOPHEN 325 MG PO TABS
650.0000 mg | ORAL_TABLET | Freq: Four times a day (QID) | ORAL | Status: DC | PRN
  Administered 2021-07-03 – 2021-07-07 (×2): 650 mg via ORAL
  Filled 2021-07-02 (×3): qty 2

## 2021-07-02 MED ORDER — ONDANSETRON HCL 4 MG/2ML IJ SOLN
INTRAMUSCULAR | Status: DC | PRN
  Administered 2021-07-02: 4 mg via INTRAVENOUS

## 2021-07-02 MED ORDER — SACUBITRIL-VALSARTAN 24-26 MG PO TABS
1.0000 | ORAL_TABLET | Freq: Two times a day (BID) | ORAL | Status: DC
  Administered 2021-07-02 – 2021-07-08 (×11): 1 via ORAL
  Filled 2021-07-02 (×15): qty 1

## 2021-07-02 SURGICAL SUPPLY — 2 items
DRAPE HALF SHEET 40X57 (DRAPES) ×1 IMPLANT
KIT BASIN OR (CUSTOM PROCEDURE TRAY) ×1 IMPLANT

## 2021-07-02 NOTE — Assessment & Plan Note (Addendum)
No evidence of current decompensation. BNP wnl.  ?- Continue home medications including coreg, lasix '40mg'$  daily, entresto.  ?- Appreciate cardiology recommendations regarding perioperative monitoring. ?- Consider spironolactone/SGLT2i at follow up ?

## 2021-07-02 NOTE — Progress Notes (Signed)
?Progress Note ? ?Patient: Billy [name] ELF:[id number] DOB: 15-Feb-1966  ?DOA: 07/01/2021  DOS: 07/02/2021  ?  ?Brief hospital course: ?Billy [name] is a 56 y.o. male with a history of VF cardiac arrest s/p ICD placement 2010, chronic HFrEF, CAD s/p CABGx4 2003, BMS to [place] 2009, PAF s/p ablation 2022 who presented to the ED 3/31 after slipping in wet grass at home, falling with severe immediate left hip pain. XR confirmed dislocated left hip with comminuted acetabular fracture. Orthopedics was consulted, taken to OR for closed reduction of left hip and traction placement. Orthopedics is planning traction through the weekend and will perform definitive ORIF 4/3.  ? ?Assessment and Plan: ?* Closed left acetabular fracture ([place]) ?- Hold plavix and eliquis  ?- Maintain bedrest and LLE traction per orthopedics, planning definitive ORIF 4/3. ?- Continue pain control as ordered ? ?Dislocated hip, left, initial encounter [facility]) ?S/p closed reduction in OR with sedation 3/31 with subsequent subluxation. Continue traction. ? ?Stage 3a chronic kidney disease (CKD) ([name]) - baseline SCr 1.3-1.6 ?Stable. ?- Avoid nephrotoxins ? ?S/P quadruple vessel bypass ?CAD s/p remote CABGx4 2003, BMS in 2009 since then. He denies current or recent angina.  ?- Continue home BB, imdur, statin, zetia. ? ?GERD (gastroesophageal reflux disease) ?On ppi. ? ?Implantable cardioverter-defibrillator (ICD) in situ ?Recent generator exchange. No shocks recently. ? ?Essential hypertension ?Continue home meds. ? ?PAF (paroxysmal atrial fibrillation) ([name])-resolved as of 07/01/2021 ?In NSR, s/p ablation 2022. ?- Continue coreg.  ?- Holding anticoagulation. CHA2DS2-VASc score is 2. Option of bridging anticoagulation was discussed with patient and family at length. With a low absolute risk of stroke and some risk of bleeding, will plan to give pharmacologic VTE prophylaxis, and no bridging anticoagulation at this time. They note he held eliquis for ICD  generator exchange and was not told to bridge with lovenox. ? ?Chronic HFrEF (heart failure with reduced ejection fraction) ([place]) ?No evidence of current decompensation.  ?- Continue home medications including coreg, lasix '40mg'$  daily, entresto.  ? ?Hypothyroidism ?- Continue synthroid. Last TSH was 4.63 ? ?Obesity: Estimated body mass index is 36.96 kg/m? as calculated from the following: ?  Height as of 06/17/21: '5\' 11"'$  (1.803 m). ?  Weight as of 06/17/21: 120.2 kg. ? ?Subjective: Pain in left leg is mild-moderate, improved with medications. He denies recent or current chest pain, dyspnea, orthopnea, PND, leg swelling, or palpitations. Recently had ICD generator exchange and cardiology recommended holding eliquis without bridging for that. He's never been told to bridge anticoagulation in the past. Has been in NSR. ? ?Objective: ?Vitals:  ? 07/02/21 0428 07/02/21 0455 07/02/21 0808 07/02/21 1542  ?BP: 120/76 95/67 113/62 119/78  ?Pulse: (!) 59 60 (!) 59 (!) 58  ?Resp: '16 18 17 18  '$ ?Temp: 99.1 ?F (37.3 ?C) 98.4 ?F (36.9 ?C) 98.4 ?F (36.9 ?C) 98.3 ?F (36.8 ?C)  ?TempSrc:  Oral Oral Oral  ?SpO2: 97% 98% 100% 92%  ? ?Gen: Pleasant 56 y.o. male in no distress ?Pulm: Nonlabored breathing room air. Clear ?CV: Regular borderline bradycardia without murmur, rub, or gallop. No definite JVD, no pitting dependent edema. ?GI: Abdomen soft, non-tender, non-distended, with normoactive bowel sounds.  ?Ext: Warm, dry, LLE traction applied ?Skin: No rashes, lesions or ulcers on visualized skin. ?Neuro: Alert and oriented. No focal neurological deficits. ?Psych: Judgement and insight appear fair. Mood euthymic & affect congruent. Behavior is appropriate.   ? ?Data Personally reviewed: ? ?CBC: ?Recent Labs  ?Lab 07/01/21 ?2240  ?WBC 11.5*  ?NEUTROABS 8.1*  ?  HGB 16.0  ?HCT 47.1  ?MCV 82.1  ?PLT 271  ? ?Basic Metabolic Panel: ?Recent Labs  ?Lab 07/01/21 ?2240  ?NA 141  ?K 3.8  ?CL 109  ?CO2 24  ?GLUCOSE 93  ?BUN 13  ?CREATININE 1.41*   ?CALCIUM 9.0  ? ?GFR: ?CrCl cannot be calculated (Unknown ideal weight.). ?Liver Function Tests: ?No results for input(s): AST, ALT, ALKPHOS, BILITOT, PROT, ALBUMIN in the last 168 hours. ?No results for input(s): LIPASE, AMYLASE in the last 168 hours. ?No results for input(s): AMMONIA in the last 168 hours. ?Coagulation Profile: ?Recent Labs  ?Lab 07/01/21 ?2240  ?INR 1.2  ? ?Cardiac Enzymes: ?No results for input(s): CKTOTAL, CKMB, CKMBINDEX, TROPONINI in the last 168 hours. ?BNP (last 3 results) ?No results for input(s): PROBNP in the last 8760 hours. ?HbA1C: ?No results for input(s): HGBA1C in the last 72 hours. ?CBG: ?No results for input(s): GLUCAP in the last 168 hours. ?Lipid Profile: ?No results for input(s): CHOL, HDL, LDLCALC, TRIG, CHOLHDL, LDLDIRECT in the last 72 hours. ?Thyroid Function Tests: ?No results for input(s): TSH, T4TOTAL, FREET4, T3FREE, THYROIDAB in the last 72 hours. ?Anemia Panel: ?No results for input(s): VITAMINB12, FOLATE, FERRITIN, TIBC, IRON, RETICCTPCT in the last 72 hours. ?Urine analysis: ?   ?Component Value Date/Time  ? [place] 11/29/2020 1018  ? APPEARANCEUR CLEAR 11/29/2020 1018  ? LABSPEC 1.010 11/29/2020 1018  ? PHURINE 6.0 11/29/2020 1018  ? GLUCOSEU NEGATIVE 11/29/2020 1018  ? [place] NEGATIVE 11/29/2020 1018  ? [place] NEGATIVE 11/29/2020 1018  ? BILIRUBINUR negative 06/06/2016 1012  ? [place] NEGATIVE 11/29/2020 1018  ? PROTEINUR negative 06/06/2016 1012  ? PROTEINUR 30 (A) 10/20/2015 1148  ? UROBILINOGEN 0.2 11/29/2020 1018  ? NITRITE NEGATIVE 11/29/2020 1018  ? LEUKOCYTESUR NEGATIVE 11/29/2020 1018  ? ?No results found for this or any previous visit (from the past 240 hour(s)).   ?DG Chest 1 View ? ?Result Date: 07/01/2021 ?CLINICAL DATA:  Hip fracture.  Fall. EXAM: CHEST  1 VIEW COMPARISON:  05/10/2020 FINDINGS: Cardiac pacemaker. Postoperative changes in the mediastinum. Cardiac enlargement. No vascular congestion, edema, or consolidation. No pleural  effusions. No pneumothorax. Mediastinal contours appear intact. Visualized ribs are nondisplaced. IMPRESSION: Shallow inspiration. Cardiac enlargement. No evidence of active pulmonary disease. Electronically Signed   By: Lucienne Capers M.D.   On: 07/01/2021 22:51  ? ?CT PELVIS WO CONTRAST ? ?Result Date: 07/02/2021 ?CLINICAL DATA:  Pelvic fracture EXAM: CT PELVIS WITHOUT CONTRAST TECHNIQUE: Multidetector CT imaging of the pelvis was performed following the standard protocol without intravenous contrast. RADIATION DOSE REDUCTION: This exam was performed according to the departmental dose-optimization program which includes automated exposure control, adjustment of the mA and/or kV according to patient size and/or use of iterative reconstruction technique. COMPARISON:  Radiographs dated July 01, 2021 FINDINGS: Bones/Joint/Cartilage There is a displaced comminuted anterior acetabular wall fracture with disruption of the iliopectineal line consistent with anterior column fracture. There is approximately 6 mm superior and 1.1 cm medial displacement of the anterior acetabulum into the pelvis. There is also anterior dislocation of the femoral head. There is also comminuted displaced fracture of the posterior acetabular wall with approximately 2 cm posterior and 1 cm superior displacement of the major fracture fragment. The ilioischial line is intact. There is a 1.3 cm intra-articular osseous fragment about the inferior aspect of the acetabulum. The femoral head appears intact. The sacroiliac joints and pubic symphysis are intact. Ligaments Ligaments are suboptimally evaluated by CT. Muscles and Tendons There is small intramuscular hematoma  of the distal left iliopsoas muscle with asymmetric enlargement. The remaining muscles and tendons appear intact. Soft tissue No fluid collection or hematoma.  No soft tissue mass. IMPRESSION: 1. Displaced comminuted anterior acetabular wall fracture with disruption of the iliopectineal  line consistent with anterior column fracture. 2. Displaced comminuted posterior acetabular wall fracture with intact posterior column. 3. Anterior superior displacement of the femoral head due to displaced anter

## 2021-07-02 NOTE — Progress Notes (Signed)
[facility]edic Trauma [facility]  ? ?OTS aware of patient. Full consult to follow ? ?Likely OR Monday for definitive ORIF  ?Traction and bedrest until then  ? ?Follow up plain xrays as it appears pt subluxed in CT scan when traction was taken off ? ?Ok to eat  ? ?Jari Pigg, PA-C ?[phone] (C) ?07/02/2021, 11:06 AM ? ?[facility] ?[place] [address] ?[phone] Jenetta Downer) ?[phone] (F) ? ?  ? ? ?Patient ID: Billy [name], male   DOB: 07-14-1965, 56 y.o.   MRN: [id number] ? ?

## 2021-07-02 NOTE — Anesthesia Procedure Notes (Signed)
Procedure Name: Intubation ?Date/Time: 07/02/2021 3:23 AM ?Performed by: Valetta Fuller, CRNA ?Pre-anesthesia Checklist: Patient identified, Emergency Drugs available, Suction available and Patient being monitored ?Patient Re-evaluated:Patient Re-evaluated prior to induction ?Oxygen Delivery Method: Circle system utilized ?Preoxygenation: Pre-oxygenation with 100% oxygen ?Induction Type: IV induction ?Ventilation: Mask ventilation without difficulty ?Laryngoscope Size: Glidescope ?Grade View: Grade II ?Tube size: 7.5 mm ?Number of attempts: 2 ?Airway Equipment and Method: Stylet ?Placement Confirmation: ETT inserted through vocal cords under direct vision, positive ETCO2 and breath sounds checked- equal and bilateral ?Secured at: 23 cm ?Tube secured with: Tape ?Dental Injury: Teeth and Oropharynx as per pre-operative assessment  ?Comments: Unable to visualize with Sabra Heck 2. Mask ventilate. Grade 2 view with Glidescope #3.  ? ? ? ? ?

## 2021-07-02 NOTE — ED Triage Notes (Signed)
Pt slipped in wet grass and has left hip pain with shortening and rotation. ?

## 2021-07-02 NOTE — Assessment & Plan Note (Addendum)
-   Continue synthroid. Last TSH was 4.63  ?

## 2021-07-02 NOTE — Op Note (Signed)
Preoperative diagnosis: Left hip fracture dislocation ? ?Postoperative diagnosis: Same ? ?Procedure performed: Closed reduction left hip fracture dislocation with skeletal traction placement ? ?Surgeon: Isabella Stalling, MD ? ?Complications: None ? ?EBL: None ? ?Indications: The patient fell earlier today suffering a left hip fracture dislocation.  The ER physician felt it was not possible to be reduced in the emergency department given patient's size.  He was indicated for trip to the operating room for closed reduction with skeletal traction placement. ? ?Procedure: The patient was identified in the preoperative holding area where I personally marked the operative site after verifying site side and procedure with the patient.  He was taken back to the operating room where general anesthesia was induced and he was moved to the operative table.  X-ray was used to verify the hip was still dislocated with posterior acetabular fracture.  Traction was applied and the hip was reduced.  X-ray verified this.  There was a fairly large posterior wall acetabular fracture making this unstable.  Therefore traction pin was placed distally in the tibia. ?Attention was turned to the tibia where Betadine prep was used.  A 2.4 mm K wire was placed lateral to medial 2 fingerbreadths below the tibial tubercle.  The traction bow was applied and 15 pounds of traction was hung. ? ?The patient was allowed to awaken from anesthesia, transferred to the stretcher and taken to the recovery room in stable condition. ? ?Postoperative plan: He will have a postoperative CT scan to further characterize the fracture for operative planning.  His orthopedic care will be transferred to the orthopedic trauma team tomorrow. ?

## 2021-07-02 NOTE — Progress Notes (Signed)
Orthopedic Tech Progress Note ?Patient Details:  ?Billy [name] ?08-07-1965 ?[id number] ? ?Musculoskeletal Traction ?Type of Traction: Other (Comment) ?Traction Location: delivered 15 lbs of sandbags to the OR ?Traction Weight: 15 lbs ?  ?Post Interventions ?Patient Tolerated: Well ?Instructions Provided: Care of device, Adjustment of device ? ?Karolee Stamps ?07/02/2021, 6:11 AM ? ?

## 2021-07-02 NOTE — Transfer of Care (Signed)
Immediate Anesthesia Transfer of Care Note ? ?Patient: Billy [name] ? ?Procedure(s) Performed: CLOSED REDUCTION HIP with placement of skeletal traction (Left: Hip) ? ?Patient Location: PACU ? ?Anesthesia Type:General ? ?Level of Consciousness: awake, alert  and oriented ? ?Airway & Oxygen Therapy: Patient Spontanous Breathing ? ?Post-op Assessment: Report given to RN and Post -op Vital signs reviewed and stable ? ?Post vital signs: Reviewed and stable ? ?Last Vitals:  ?Vitals Value Taken Time  ?BP 106/71 07/02/21 0351  ?Temp    ?Pulse 70 07/02/21 0353  ?Resp 19 07/02/21 0353  ?SpO2 93 % 07/02/21 0353  ?Vitals shown include unvalidated device data. ? ?Last Pain:  ?Vitals:  ? 07/02/21 0203  ?PainSc: 4   ?   ? ?  ? ?Complications: No notable events documented. ?

## 2021-07-02 NOTE — Progress Notes (Addendum)
Orthopedic Tech Progress Note ?Patient Details:  ?Billy [name] ?09-Aug-1965 ?[id number] ? ?Increased weight on skeletal traction to 25 lbs per order found "Nursing Activities and Treatment". Re-strung weights through pulley system, bow remained undisturbed and intact. Confirmed sandbags were not touching the ground at the bed's lowest height and knots were secure. ? ?Musculoskeletal Traction ?Type of Traction: Skeletal (Balanced Suspension) ?Traction Location: LLE, increased weight from 15lbs to 25lbs ?Traction Weight: 25 lbs ?  ?Post Interventions ?Patient Tolerated: Well ?Instructions Provided: Care of device ? ?Sheniqua Carolan Jeri Modena ?07/02/2021, 12:09 PM ? ?

## 2021-07-02 NOTE — Anesthesia Preprocedure Evaluation (Signed)
Anesthesia Evaluation  ?Patient identified by MRN, date of birth, ID band ?Patient awake ? ? ? ?Reviewed: ?Allergy & Precautions, H&P , NPO status , Patient's Chart, lab work & pertinent test results ? ?Airway ?Mallampati: II ? ? ?Neck ROM: full ? ? ? Dental ?  ?Pulmonary ?former smoker,  ?  ?breath sounds clear to auscultation ? ? ? ? ? ? Cardiovascular ?hypertension, + CAD, + Past MI, + Cardiac Stents, + CABG, + Peripheral Vascular Disease and +CHF  ?+ dysrhythmias Atrial Fibrillation and Ventricular Tachycardia + Cardiac Defibrillator ? ?Rhythm:regular Rate:Normal ? ?TTE (06/15/21): EF 40-45% ?  ?Neuro/Psych ?  ? GI/Hepatic ?GERD  ,  ?Endo/Other  ?Hypothyroidism  ? Renal/GU ?Renal InsufficiencyRenal disease  ? ?  ?Musculoskeletal ? ? Abdominal ?  ?Peds ? Hematology ?  ?Anesthesia Other Findings ? ? Reproductive/Obstetrics ? ?  ? ? ? ? ? ? ? ? ? ? ? ? ? ?  ?  ? ? ? ? ? ? ? ? ?Anesthesia Physical ?Anesthesia Plan ? ?ASA: 4 ? ?Anesthesia Plan: General  ? ?Post-op Pain Management:   ? ?Induction: Intravenous ? ?PONV Risk Score and Plan: 2 and Propofol infusion and Treatment may vary due to age or medical condition ? ?Airway Management Planned: Mask ? ?Additional Equipment:  ? ?Intra-op Plan:  ? ?Post-operative Plan:  ? ?Informed Consent: I have reviewed the patients History and Physical, chart, labs and discussed the procedure including the risks, benefits and alternatives for the proposed anesthesia with the patient or authorized representative who has indicated his/her understanding and acceptance.  ? ? ? ?Dental advisory given ? ?Plan Discussed with: CRNA, Anesthesiologist and Surgeon ? ?Anesthesia Plan Comments:   ? ? ? ? ? ? ?Anesthesia Quick Evaluation ? ?

## 2021-07-02 NOTE — Progress Notes (Signed)
Orthopedic Tech Progress Note ?Patient Details:  ?Billy [name] ?Apr 28, 1965 ?[id number] ? ?Musculoskeletal Traction ?Type of Traction: [place] Skin Traction ?Traction Location: lle ?Traction Weight: 5 lbs ?  ?Post Interventions ?Patient Tolerated: Well ?Instructions Provided: Care of device, Adjustment of device ? ?Karolee Stamps ?07/02/2021, 12:41 AM ? ?

## 2021-07-03 ENCOUNTER — Encounter (HOSPITAL_COMMUNITY): Payer: Self-pay | Admitting: Internal Medicine

## 2021-07-03 ENCOUNTER — Inpatient Hospital Stay (HOSPITAL_COMMUNITY): Payer: Medicare HMO

## 2021-07-03 DIAGNOSIS — I5022 Chronic systolic (congestive) heart failure: Secondary | ICD-10-CM | POA: Diagnosis not present

## 2021-07-03 DIAGNOSIS — M84454A Pathological fracture, pelvis, initial encounter for fracture: Secondary | ICD-10-CM | POA: Diagnosis present

## 2021-07-03 DIAGNOSIS — S73005A Unspecified dislocation of left hip, initial encounter: Secondary | ICD-10-CM | POA: Diagnosis not present

## 2021-07-03 DIAGNOSIS — E559 Vitamin D deficiency, unspecified: Secondary | ICD-10-CM | POA: Diagnosis present

## 2021-07-03 DIAGNOSIS — I10 Essential (primary) hypertension: Secondary | ICD-10-CM | POA: Diagnosis not present

## 2021-07-03 DIAGNOSIS — S32402A Unspecified fracture of left acetabulum, initial encounter for closed fracture: Secondary | ICD-10-CM | POA: Diagnosis not present

## 2021-07-03 DIAGNOSIS — Z01810 Encounter for preprocedural cardiovascular examination: Secondary | ICD-10-CM

## 2021-07-03 HISTORY — DX: Pathological fracture, pelvis, initial encounter for fracture: M84.454A

## 2021-07-03 LAB — BASIC METABOLIC PANEL
Anion gap: 5 (ref 5–15)
BUN: 9 mg/dL (ref 6–20)
CO2: 29 mmol/L (ref 22–32)
Calcium: 8.8 mg/dL — ABNORMAL LOW (ref 8.9–10.3)
Chloride: 102 mmol/L (ref 98–111)
Creatinine, Ser: 1.41 mg/dL — ABNORMAL HIGH (ref 0.61–1.24)
GFR, Estimated: 59 mL/min — ABNORMAL LOW (ref >60)
Glucose, Bld: 104 mg/dL — ABNORMAL HIGH (ref 70–99)
Potassium: 4.1 mmol/L (ref 3.5–5.1)
Sodium: 136 mmol/L (ref 135–145)

## 2021-07-03 LAB — SURGICAL PCR SCREEN
MRSA, PCR: NEGATIVE
Staphylococcus aureus: NEGATIVE

## 2021-07-03 LAB — HIV ANTIBODY (ROUTINE TESTING W REFLEX): HIV Screen 4th Generation wRfx: NONREACTIVE

## 2021-07-03 LAB — BRAIN NATRIURETIC PEPTIDE: B Natriuretic Peptide: 50.3 pg/mL (ref 0.0–100.0)

## 2021-07-03 LAB — VITAMIN D 25 HYDROXY (VIT D DEFICIENCY, FRACTURES): Vit D, 25-Hydroxy: 14.98 ng/mL — ABNORMAL LOW (ref 30–100)

## 2021-07-03 MED ORDER — OXYCODONE HCL 5 MG PO TABS
5.0000 mg | ORAL_TABLET | Freq: Four times a day (QID) | ORAL | Status: DC | PRN
  Administered 2021-07-03 – 2021-07-04 (×2): 10 mg via ORAL
  Filled 2021-07-03 (×2): qty 2

## 2021-07-03 MED ORDER — AMIODARONE HCL 200 MG PO TABS
200.0000 mg | ORAL_TABLET | Freq: Every day | ORAL | Status: DC
Start: 2021-07-03 — End: 2021-07-03

## 2021-07-03 MED ORDER — METHOCARBAMOL 500 MG PO TABS
1000.0000 mg | ORAL_TABLET | Freq: Three times a day (TID) | ORAL | Status: DC
  Administered 2021-07-03 – 2021-07-08 (×14): 1000 mg via ORAL
  Filled 2021-07-03 (×14): qty 2

## 2021-07-03 MED ORDER — ACETAMINOPHEN 10 MG/ML IV SOLN
1000.0000 mg | Freq: Four times a day (QID) | INTRAVENOUS | Status: DC
  Administered 2021-07-03 – 2021-07-04 (×2): 1000 mg via INTRAVENOUS
  Filled 2021-07-03 (×2): qty 100

## 2021-07-03 MED ORDER — CEFAZOLIN IN SODIUM CHLORIDE 3-0.9 GM/100ML-% IV SOLN
3.0000 g | INTRAVENOUS | Status: AC
  Administered 2021-07-04: 3 g via INTRAVENOUS
  Filled 2021-07-03: qty 100

## 2021-07-03 MED ORDER — MUPIROCIN 2 % EX OINT
1.0000 "application " | TOPICAL_OINTMENT | Freq: Two times a day (BID) | CUTANEOUS | Status: DC
  Administered 2021-07-03 – 2021-07-07 (×5): 1 via NASAL
  Filled 2021-07-03 (×3): qty 22

## 2021-07-03 MED ORDER — OXYCODONE-ACETAMINOPHEN 5-325 MG PO TABS: 1.0000 | ORAL_TABLET | ORAL | Status: DC | PRN

## 2021-07-03 MED ORDER — HYDROMORPHONE HCL 2 MG PO TABS
2.0000 mg | ORAL_TABLET | ORAL | Status: DC | PRN
  Administered 2021-07-03 – 2021-07-04 (×4): 4 mg via ORAL
  Filled 2021-07-03 (×4): qty 2

## 2021-07-03 NOTE — Progress Notes (Signed)
?Progress Note ? ?Patient: Billy [name] GYB:[id number] DOB: [birth year]  ?DOA: 07/01/2021  DOS: 07/03/2021  ?  ?Brief hospital course: ?Billy [name] is a 56 y.o. male with a history of VF cardiac arrest s/p ICD placement 2010, chronic HFrEF, CAD s/p CABGx4 2003, BMS to [place] 2009, PAF s/p ablation 2022 who presented to the ED 3/31 after slipping in wet grass at home, falling with severe immediate left hip pain. XR confirmed dislocated left hip with comminuted acetabular fracture. Orthopedics was consulted, taken to OR for closed reduction of left hip and traction placement. Orthopedics is planning traction through the weekend and will perform definitive ORIF 4/3.  ? ?Assessment and Plan: ?* Closed left acetabular fracture ([place]) ?- Hold plavix and eliquis  ?- Maintain bedrest and LLE traction per orthopedics, planning definitive ORIF 4/3. ?- Add percocet prn to keep analgesia for longer duration. Continue IV dilaudid '1mg'$  prn severe, unrelieved pain. ? ?Dislocated hip, left, initial encounter [facility]) ?S/p closed reduction in OR with sedation 3/31 with subsequent subluxation. Continue traction. ? ?Stage 3a chronic kidney disease (CKD) ([name]) - baseline SCr 1.3-1.6 ?Stable. ?- Avoid nephrotoxins ? ?S/P quadruple vessel bypass ?CAD s/p remote CABGx4 2003, BMS in 2009 since then. He denies current or recent angina.  ?- Continue home BB, imdur, statin, zetia. ? ?GERD (gastroesophageal reflux disease) ?- Continue PPI. ? ?Implantable cardioverter-defibrillator (ICD) in situ ?Recent generator exchange. No shocks recently. Interrogated per cardiology. ? ?Essential hypertension ?Continue home meds. ? ?PAF (paroxysmal atrial fibrillation) ([name])-resolved as of 07/01/2021 ?In NSR, s/p SVT ablation 2022. ?- Continue coreg.  ?- Holding anticoagulation. CHA2DS2-VASc score is 2. Will restart anticoagulation once cleared to do so by orthopedics. ? ?Chronic HFrEF (heart failure with reduced ejection fraction) ([place]) ?No evidence of current  decompensation. BNP wnl.  ?- Continue home medications including coreg, lasix '40mg'$  daily, entresto.  ?- Appreciate cardiology recommendations regarding perioperative monitoring. No further preoperative evaluations are necessary.  ?- Consider spironolactone/SGLT2i at follow up ? ?Hypothyroidism ?- Continue synthroid. Last TSH was 4.63  ? ?Obesity: Estimated body mass index is 36.96 kg/m? as calculated from the following: ?  Height as of 06/17/21: '5\' 11"'$  (1.803 m). ?  Weight as of 06/17/21: 120.2 kg. ? ?Subjective: Denies SOB, chest pain or leg swelling recently or currently. Pain is improved with dilaudid but wanes quickly. No sedation noted.  ? ?Objective: ?Vitals:  ? 07/02/21 1542 07/02/21 2120 07/03/21 0539 07/03/21 0924  ?BP: 119/78 116/75 120/70 107/66  ?Pulse: (!) 58 62 66 60  ?Resp: '18 18 18 18  '$ ?Temp: 98.3 ?F (36.8 ?C) 99.2 ?F (37.3 ?C) 98.8 ?F (37.1 ?C) 98.1 ?F (36.7 ?C)  ?TempSrc: Oral Oral Oral Oral  ?SpO2: 92% 95% 93% 92%  ? ?Gen: 56 y.o. male in no distress ?Pulm: Nonlabored breathing room air. Clear. ?CV: Regular rate and rhythm, borderline bradycardic without murmur, rub, or gallop. No JVD, no pitting dependent edema. ?GI: Abdomen soft, non-tender, non-distended, with normoactive bowel sounds.  ?Ext: Warm, no deformities. LLE in traction.  ?Skin: No rashes, lesions or ulcers on visualized skin. ?Neuro: Alert and oriented. No focal neurological deficits. ?Psych: Judgement and insight appear fair. Mood euthymic & affect congruent. Behavior is appropriate.   ? ?Data Personally reviewed: ? ?CBC: ?Recent Labs  ?Lab 07/01/21 ?2240  ?WBC 11.5*  ?NEUTROABS 8.1*  ?HGB 16.0  ?HCT 47.1  ?MCV 82.1  ?PLT 271  ? ?Basic Metabolic Panel: ?Recent Labs  ?Lab 07/01/21 ?2240 07/03/21 ?0052  ?NA 141 136  ?K 3.8 4.1  ?  CL 109 102  ?CO2 24 29  ?GLUCOSE 93 104*  ?BUN 13 9  ?CREATININE 1.41* 1.41*  ?CALCIUM 9.0 8.8*  ? ?GFR: ?CrCl cannot be calculated (Unknown ideal weight.). ?Liver Function Tests: ?No results for input(s): AST,  ALT, ALKPHOS, BILITOT, PROT, ALBUMIN in the last 168 hours. ?No results for input(s): LIPASE, AMYLASE in the last 168 hours. ?No results for input(s): AMMONIA in the last 168 hours. ?Coagulation Profile: ?Recent Labs  ?Lab 07/01/21 ?2240  ?INR 1.2  ? ?Cardiac Enzymes: ?No results for input(s): CKTOTAL, CKMB, CKMBINDEX, TROPONINI in the last 168 hours. ?BNP (last 3 results) ?No results for input(s): PROBNP in the last 8760 hours. ?HbA1C: ?No results for input(s): HGBA1C in the last 72 hours. ?CBG: ?No results for input(s): GLUCAP in the last 168 hours. ?Lipid Profile: ?No results for input(s): CHOL, HDL, LDLCALC, TRIG, CHOLHDL, LDLDIRECT in the last 72 hours. ?Thyroid Function Tests: ?No results for input(s): TSH, T4TOTAL, FREET4, T3FREE, THYROIDAB in the last 72 hours. ?Anemia Panel: ?No results for input(s): VITAMINB12, FOLATE, FERRITIN, TIBC, IRON, RETICCTPCT in the last 72 hours. ?Urine analysis: ?   ?Component Value Date/Time  ? [place] 11/29/2020 1018  ? APPEARANCEUR CLEAR 11/29/2020 1018  ? LABSPEC 1.010 11/29/2020 1018  ? PHURINE 6.0 11/29/2020 1018  ? GLUCOSEU NEGATIVE 11/29/2020 1018  ? [place] NEGATIVE 11/29/2020 1018  ? [place] NEGATIVE 11/29/2020 1018  ? BILIRUBINUR negative 06/06/2016 1012  ? [place] NEGATIVE 11/29/2020 1018  ? PROTEINUR negative 06/06/2016 1012  ? PROTEINUR 30 (A) 10/20/2015 1148  ? UROBILINOGEN 0.2 11/29/2020 1018  ? NITRITE NEGATIVE 11/29/2020 1018  ? LEUKOCYTESUR NEGATIVE 11/29/2020 1018  ? ?No results found for this or any previous visit (from the past 240 hour(s)).   ?DG Chest 1 View ? ?Result Date: 07/01/2021 ?CLINICAL DATA:  Hip fracture.  Fall. EXAM: CHEST  1 VIEW COMPARISON:  05/10/2020 FINDINGS: Cardiac pacemaker. Postoperative changes in the mediastinum. Cardiac enlargement. No vascular congestion, edema, or consolidation. No pleural effusions. No pneumothorax. Mediastinal contours appear intact. Visualized ribs are nondisplaced. IMPRESSION: Shallow inspiration.  Cardiac enlargement. No evidence of active pulmonary disease. Electronically Signed   By: Lucienne Capers M.D.   On: 07/01/2021 22:51  ? ?CT PELVIS WO CONTRAST ? ?Result Date: 07/02/2021 ?CLINICAL DATA:  Pelvic fracture EXAM: CT PELVIS WITHOUT CONTRAST TECHNIQUE: Multidetector CT imaging of the pelvis was performed following the standard protocol without intravenous contrast. RADIATION DOSE REDUCTION: This exam was performed according to the departmental dose-optimization program which includes automated exposure control, adjustment of the mA and/or kV according to patient size and/or use of iterative reconstruction technique. COMPARISON:  Radiographs dated July 01, 2021 FINDINGS: Bones/Joint/Cartilage There is a displaced comminuted anterior acetabular wall fracture with disruption of the iliopectineal line consistent with anterior column fracture. There is approximately 6 mm superior and 1.1 cm medial displacement of the anterior acetabulum into the pelvis. There is also anterior dislocation of the femoral head. There is also comminuted displaced fracture of the posterior acetabular wall with approximately 2 cm posterior and 1 cm superior displacement of the major fracture fragment. The ilioischial line is intact. There is a 1.3 cm intra-articular osseous fragment about the inferior aspect of the acetabulum. The femoral head appears intact. The sacroiliac joints and pubic symphysis are intact. Ligaments Ligaments are suboptimally evaluated by CT. Muscles and Tendons There is small intramuscular hematoma of the distal left iliopsoas muscle with asymmetric enlargement. The remaining muscles and tendons appear intact. Soft tissue No fluid collection or hematoma.  No soft tissue mass. IMPRESSION: 1. Displaced comminuted anterior acetabular wall fracture with disruption of the iliopectineal line consistent with anterior column fracture. 2. Displaced comminuted posterior acetabular wall fracture with intact posterior  column. 3. Anterior superior displacement of the femoral head due to displaced anterior column fracture. 4. Small intra-articular osseous fragment measuring up to 1.3 cm about the inferior aspect of the ac

## 2021-07-03 NOTE — Progress Notes (Signed)
Per device rep, patient is not pacing with his device, can use magnet over device during surgery as a precaution. ?

## 2021-07-03 NOTE — Plan of Care (Signed)
?  Problem: Nutrition: ?Goal: Adequate nutrition will be maintained ?Outcome: Progressing ?  ?Problem: Safety: ?Goal: Ability to remain free from injury will improve ?Outcome: Progressing ?  ?Problem: Pain Managment: ?Goal: General experience of comfort will improve ?Outcome: Not Progressing ?  ?

## 2021-07-03 NOTE — Consult Note (Addendum)
?Cardiology Consultation:  ? ?Patient ID: [name] ?MRN: [id number]; DOB: 1965/08/12 ? ?Admit date: 07/01/2021 ?Date of Consult: 07/03/2021 ? ?PCP:  [name], MD ?  ?[facility] Providers ?Cardiologist:  Kirk Ruths, MD  ?Electrophysiologist:  Virl Axe, MD     ? ? ?Patient Profile:  ? ?Hamsa Kunz is a 56 y.o. male with a hx of CAD (PCI to Cx age 82, CABGx4 in 2003, BMS to [place] in 12/2007, PTCA to Cx in 01/2019), VF arrest 02/2009 s/p ICD ([place] gen change 06/2021), paroxysmal VT, ischemic cardiomyopathy, chronic combined heart failure, PAF diagnosed in 2020, PSVT s/p ablation 06/2020, HTN, HLD, marijuana abuse, former tobacco abuse, PAD s/p prior stent to REIA, CKD stage 3a by labs, prior C diff infection who is being seen 07/03/2021 for the evaluation of preoperative evaluation at the request of Dr. Bonner Puna. ? ?History of Present Illness:  ? ?To recap, Mr. Schaumburg follows with Dr. Stanford Breed for primary cardiology, Dr. Caryl Comes and Dr. Lovena Le for EP, and Dr. Fletcher Anon for PAD. He had premature onset of CAD with stenting of the Cx around age 46 at 49. Repeat cath 06/2001 showed multivessel CAD and he underwent CABGx4 (LIMA-LAD, SVG-PDA, SVG-OM, SVG-ramus). In 12/2007 he developed VT during a treadmill stress test so underwent cardiac catheterization showing severe 3-vessel CAD with 2/4 grafts patent with mildly reduced EF, underwent BMS to the OM1. Cardiac MRI in 03/2008 was abnormal with scar, EF 35%. The patient deferred ICD implantation at that time. In 02/2009 he suffered an out-of-hospital VF arrest. Emergent cath showed no culprit, 2/4 patent bypass grafts, patent OM stent, known occlusions of VG-RCA and VG-OM). EF was down to 10-15% and he underwent St. Jude ICD implantation. Repeat cath 01/2019 showed newly documented atretic underfilling LIMA-LAD and occlusive disease in the Cx, underwent scoring balloon PTCA of ostial LCx. His last cath was in 05/2020 with significant multivessel CAD, closed  LIMA-LAD, patent SVG-diagonal, old occlusion of SVG-OM2 and SVG-distal RCA. Medical therapy was recommended. In addition to CAD he also was diagnosed with PAF in 2020 at which time he was started on Eliquis, Brilinta switched to Plavix, and ASA discontinued. He has a history of ICD shocks. He underwent SVT ablation 06/2020 as he had a history of SVT inducing VT/VF resulting in ICD shocks. He was found to have almost incessant tachycardia in the RA and had a successful ablation. He had a gen change in 06/2021. Last echo 06/2021 showed EF 40-45%, global HK, grade 1 DD, mildly reduced RV function, EF largely similar to 2022. Patient is no longer taking amiodarone - states Dr. Caryl Comes discontinued this at time of gen change last month. It is not listed on procedure notes but it was recommended to discontinue on AVS from that encounter. ? ?He presented to the ER with left hip pain after slipping on wet grass, found to have left acetabular fracture. He went to the OR overnight for closed reduction of left hip fracture dislocation with skeletal traction placement with tentative plan to return to OR Monday for definitive ORIF. Labs notable for Cr 1.41 (similar to prior), BNP 50.3, Hgb wnl, last K 4.1. Plavix and Eliquis were held. He reports he has felt at baseline recently from a cardiac standpoint. He does get chronic dyspnea with higher levels of exertion, I.e. after walking 1/2 mile, without recent change. He also occasionally gets a sensation of palpitations sometimes when he first starts to exert himself that is associated with SOB but it is short lived  around 30 seconds. Denies ICD shocks recently. No syncope or chest pain. No edema.  ? ?ICD interrogated per d/w rep - patient has app on his phone which transmits daily. Per rep, HR predominantly hovers around 60bpm-  there is very low burden of elevated HR (1.2%, ranging 180-190bpm) felt to be ectopy - since device is only single chamber there is no atrial algorithm to say  the official rhythm but there are no sustained events, ATP or ICD shocks. ? ? ?Past Medical History:  ?Diagnosis Date  ? Aortic atherosclerosis ([place])   ? on CXR  ? Blood in stool   ? C. difficile colitis   ? a. remote hx 2010.  ? Cardiac arrest - ventricular fibrillation 02/11/2009  ? a. 02/2009 s/p St Jude ICD.  ? Chronic combined systolic and diastolic CHF (congestive heart failure) ([place])   ? Chronic kidney disease, stage 3a ([place])   ? Coronary artery disease   ? a. PCI to Cx age 29. b. CABGx4 in 2003. c. BMS to OM1 in 12/2007. d. PTCA to Cx 01/2019.  ? Former tobacco use   ? Hyperlipidemia   ? Hypertension   ? Ischemic cardiomyopathy   ? Ischemic cardiomyopathy   ? Marijuana abuse   ? Myocardial infarct [facility])   ? NON-ST-SEGMENT ELEVATION MI  ? Obesity   ? PAD (peripheral artery disease) ([place])   ? PAF (paroxysmal atrial fibrillation) ([place])   ? PSVT (paroxysmal supraventricular tachycardia) ([place])   ? Ventricular tachyarrhythmia ([place])   ? Ventricular tachycardia ([place])   ? ? ?Past Surgical History:  ?Procedure Laterality Date  ? ABDOMINAL AORTOGRAM W/LOWER EXTREMITY N/A 05/14/2019  ? Procedure: ABDOMINAL AORTOGRAM W/LOWER EXTREMITY;  Surgeon: Wellington Hampshire, MD;  Location: [place] [facility];  Service: Cardiovascular;  Laterality: N/A;  ? CARDIAC DEFIBRILLATOR PLACEMENT    ? STJ single chamber ICD implanted for secondary prevention  ? CIRCUMCISION    ? CORONARY ARTERY BYPASS GRAFT  06/17/01  ? X 4  ? CORONARY BALLOON ANGIOPLASTY N/A 01/22/2019  ? Procedure: CORONARY BALLOON ANGIOPLASTY;  Surgeon: Leonie Man, MD;  Location: [place] [facility];  Service: Cardiovascular;  Laterality: N/A;  ? ICD GENERATOR CHANGEOUT N/A 06/17/2021  ? Procedure: ICD GENERATOR CHANGEOUT;  Surgeon: Deboraha Sprang, MD;  Location: [place] [facility];  Service: Cardiovascular;  Laterality: N/A;  ? LEFT HEART CATH AND CORS/GRAFTS ANGIOGRAPHY N/A 01/22/2019  ? Procedure: LEFT HEART CATH AND CORS/GRAFTS ANGIOGRAPHY;  Surgeon: Leonie Man, MD;  Location: [place] [facility];  Service: Cardiovascular;  Laterality: N/A;  ? LEFT HEART CATH AND CORS/GRAFTS ANGIOGRAPHY N/A 05/18/2020  ? Procedure: LEFT HEART CATH AND CORS/GRAFTS ANGIOGRAPHY;  Surgeon: Troy Sine, MD;  Location: [place] [facility];  Service: Cardiovascular;  Laterality: N/A;  ? PERIPHERAL VASCULAR INTERVENTION Right 05/14/2019  ? Procedure: PERIPHERAL VASCULAR INTERVENTION;  Surgeon: Wellington Hampshire, MD;  Location: [place] [facility];  Service: Cardiovascular;  Laterality: Right;  ? SVT ABLATION N/A 06/21/2020  ? Procedure: SVT ABLATION;  Surgeon: Evans Lance, MD;  Location: [place] [facility];  Service: Cardiovascular;  Laterality: N/A;  ?  ? ?Home Medications:  ?Prior to Admission medications   ?Medication Sig Start Date End Date Taking? Authorizing Provider  ?apixaban (ELIQUIS) 5 MG TABS tablet Take 1 tablet (5 mg total) by mouth 2 (two) times daily. 12/09/20  Yes Lelon Perla, MD  ?atorvastatin (LIPITOR) 80 MG tablet TAKE 1 TABLET EVERY DAY ?Patient taking  differently: Take 80 mg by mouth daily. 11/29/20  Yes Lelon Perla, MD  ?carvedilol (COREG) 12.5 MG tablet TAKE 1 TABLET TWICE DAILY WITH A MEAL (DISCONTINUE LISINOPRIL 2.'5MG'$ ) ?Patient taking differently: Take 12.5 mg by mouth 2 (two) times daily with a meal. 05/30/21  Yes Roark Rufo, Denice Bors, MD  ?clopidogrel (PLAVIX) 75 MG tablet TAKE 1 TABLET EVERY DAY ?Patient taking differently: Take 75 mg by mouth daily. 11/29/20  Yes Advaith Lamarque, Denice Bors, MD  ?ENTRESTO 24-26 MG TAKE 1 TABLET BY MOUTH TWICE DAILY ?Patient taking differently: Take 1 tablet by mouth 2 (two) times daily. 11/29/20  Yes Lelon Perla, MD  ?ezetimibe (ZETIA) 10 MG tablet Take 1 tablet (10 mg total) by mouth daily. 12/09/20  Yes Lelon Perla, MD  ?fluticasone (FLONASE) 50 MCG/ACT nasal spray Place 2 sprays into both nostrils daily as needed for allergies or rhinitis. ?Patient taking differently: Place 2 sprays into both nostrils 2 (two) times daily as  needed for allergies or rhinitis. 06/11/19  Yes Nicolette Bang, MD  ?furosemide (LASIX) 40 MG tablet TAKE 1 TABLET EVERY DAY ?Patient taking differently: Take 40 mg by mouth daily. 06/29/21  Y

## 2021-07-03 NOTE — Consult Note (Addendum)
? ?     Orthopaedic Trauma Service (OTS) Consult  ? ?Patient ID: ?Billy [name] ?MRN: [id number] ?DOB/AGE: Aug 13, 1965 56 y.o. ? ? ?Reason for Consult: Left acetabulum fracture dislocation ?Referring Physician: Malena Catholic, MD (Orthopaedics) ? ? ?HPI: Billy [name] is an 56 y.o. male with complex medical history including significant cardiac disease including CAD, CHF, history of sudden cardiac death status post CABG and subsequent catheterizations, ICD placement, stage III CKD, obesity, who sustained a ground-level fall on 07/01/2021.  Patient was walking in the wet grass and flip-flops slipped after his right leg went out from under him and landed on his left side.  Patient had immediate onset of pain and inability to bear weight.  He was brought to [facility] emergency department where he was found to have a left acetabulum fracture dislocation.  Due to his medical history he was admitted to the medical service and orthopedics was consulted for additional recommendations and management.  Given his medical issues as well as his body habitus the emergency room physician did not think patient would achieve adequate relaxation in the emergency department and recommended patient going to the OR for closed reduction of his left hip.  Patient was seen and evaluated by Dr. Tamera Punt.  He was taken to the operating room early morning on 07/02/2021 for closed reduction of his left acetabular fracture dislocation along with placement of skeletal traction in his proximal tibia.  After reduction patient went to the CT scanner for further characterization of his fracture.  Due to the complexity of the injury Dr. Tamera Punt asserted that definitive management was outside the scope of his practice and requested formal evaluation by a fellowship trained orthopedic traumatologist.  Patient was seen and evaluated by the orthopedic trauma service today 07/03/2021.  Patient is currently in 25 pounds of skeletal traction he is  sitting up in bed and is quite comfortable at this time.  He denies any numbness or tingling in his left lower extremity.  He denies any pain elsewhere.  No pain in his right leg no pain in his upper extremities.  Denies hitting his head no loss of consciousness no chest pain or shortness of breath at the current time.  Patient has been seen and evaluated by cardiology service and has been risk stratified for surgical intervention regarding his acetabulum fracture ? ?Patient lives with his wife in a single-story house however they have several stairs to get him into the house. ? ?Patient is on disability due to his severe cardiac disease ?EF of 40-45% echo 06/2021 ? ?Patient does report some baseline right hip (groin) pain.  Denies baseline left hip pain ? ?Past Medical History:  ?Diagnosis Date  ? Aortic atherosclerosis ([place])   ? on CXR  ? Blood in stool   ? C. difficile colitis   ? a. remote hx 2010.  ? Cardiac arrest - ventricular fibrillation 02/11/2009  ? a. 02/2009 s/p St Jude ICD.  ? Chronic combined systolic and diastolic CHF (congestive heart failure) ([place])   ? Chronic kidney disease, stage 3a ([place])   ? Coronary artery disease   ? a. PCI to Cx age 65. b. CABGx4 in 2003. c. BMS to OM1 in 12/2007. d. PTCA to Cx 01/2019.  ? Former tobacco use   ? Hyperlipidemia   ? Hypertension   ? Ischemic cardiomyopathy   ? Ischemic cardiomyopathy   ? Marijuana abuse   ? Myocardial infarct [facility])   ? NON-ST-SEGMENT ELEVATION MI  ? Obesity   ?  PAD (peripheral artery disease) ([place])   ? PAF (paroxysmal atrial fibrillation) ([place])   ? PSVT (paroxysmal supraventricular tachycardia) ([place])   ? Ventricular tachyarrhythmia ([place])   ? Ventricular tachycardia ([place])   ? ? ?Past Surgical History:  ?Procedure Laterality Date  ? ABDOMINAL AORTOGRAM W/LOWER EXTREMITY N/A 05/14/2019  ? Procedure: ABDOMINAL AORTOGRAM W/LOWER EXTREMITY;  Surgeon: Wellington Hampshire, MD;  Location: [place] [facility];  Service: Cardiovascular;  Laterality: N/A;  ?  CARDIAC DEFIBRILLATOR PLACEMENT    ? STJ single chamber ICD implanted for secondary prevention  ? CIRCUMCISION    ? CORONARY ARTERY BYPASS GRAFT  06/17/01  ? X 4  ? CORONARY BALLOON ANGIOPLASTY N/A 01/22/2019  ? Procedure: CORONARY BALLOON ANGIOPLASTY;  Surgeon: Leonie Man, MD;  Location: [place] [facility];  Service: Cardiovascular;  Laterality: N/A;  ? ICD GENERATOR CHANGEOUT N/A 06/17/2021  ? Procedure: ICD GENERATOR CHANGEOUT;  Surgeon: Deboraha Sprang, MD;  Location: [place] [facility];  Service: Cardiovascular;  Laterality: N/A;  ? LEFT HEART CATH AND CORS/GRAFTS ANGIOGRAPHY N/A 01/22/2019  ? Procedure: LEFT HEART CATH AND CORS/GRAFTS ANGIOGRAPHY;  Surgeon: Leonie Man, MD;  Location: [place] [facility];  Service: Cardiovascular;  Laterality: N/A;  ? LEFT HEART CATH AND CORS/GRAFTS ANGIOGRAPHY N/A 05/18/2020  ? Procedure: LEFT HEART CATH AND CORS/GRAFTS ANGIOGRAPHY;  Surgeon: Troy Sine, MD;  Location: [place] [facility];  Service: Cardiovascular;  Laterality: N/A;  ? PERIPHERAL VASCULAR INTERVENTION Right 05/14/2019  ? Procedure: PERIPHERAL VASCULAR INTERVENTION;  Surgeon: Wellington Hampshire, MD;  Location: [place] [facility];  Service: Cardiovascular;  Laterality: Right;  ? SVT ABLATION N/A 06/21/2020  ? Procedure: SVT ABLATION;  Surgeon: Evans Lance, MD;  Location: [place] [facility];  Service: Cardiovascular;  Laterality: N/A;  ? ? ?Family History  ?Problem Relation Age of Onset  ? Hypertension Mother   ? Heart attack Father   ?     DIED AT 54 FROM MI  ? Heart disease Sister   ?     CAD AND PREVIOUS CABG  ? Hypertension Other   ?     IN MOST OF HIS SIBLINGS  ? ? ?Social History:  reports that he quit smoking about 18 years ago. His smoking use included cigarettes. He has a 40.00 pack-year smoking history. He has never been exposed to tobacco smoke. He has never used smokeless tobacco. He reports that he does not drink alcohol and does not use drugs. ? ?Allergies: No Known  Allergies ? ?Medications: I have reviewed the patient's current medications. ?Current Meds  ?Medication Sig  ? apixaban (ELIQUIS) 5 MG TABS tablet Take 1 tablet (5 mg total) by mouth 2 (two) times daily.  ? atorvastatin (LIPITOR) 80 MG tablet TAKE 1 TABLET EVERY DAY (Patient taking differently: Take 80 mg by mouth daily.)  ? carvedilol (COREG) 12.5 MG tablet TAKE 1 TABLET TWICE DAILY WITH A MEAL (DISCONTINUE LISINOPRIL 2.'5MG'$ ) (Patient taking differently: Take 12.5 mg by mouth 2 (two) times daily with a meal.)  ? clopidogrel (PLAVIX) 75 MG tablet TAKE 1 TABLET EVERY DAY (Patient taking differently: Take 75 mg by mouth daily.)  ? ENTRESTO 24-26 MG TAKE 1 TABLET BY MOUTH TWICE DAILY (Patient taking differently: Take 1 tablet by mouth 2 (two) times daily.)  ? ezetimibe (ZETIA) 10 MG tablet Take 1 tablet (10 mg total) by mouth daily.  ? fluticasone (FLONASE) 50 MCG/ACT nasal spray Place 2 sprays into both nostrils daily as needed for  allergies or rhinitis. (Patient taking differently: Place 2 sprays into both nostrils 2 (two) times daily as needed for allergies or rhinitis.)  ? furosemide (LASIX) 40 MG tablet TAKE 1 TABLET EVERY DAY (Patient taking differently: Take 40 mg by mouth daily.)  ? isosorbide mononitrate (IMDUR) 30 MG 24 hr tablet Take 1 tablet (30 mg total) by mouth daily.  ? levothyroxine (SYNTHROID) 50 MCG tablet Take 1 tablet (50 mcg total) by mouth daily before breakfast. 30 minutes before having breakfast.  ? montelukast (SINGULAIR) 10 MG tablet TAKE 1 TABLET EVERY DAY AS NEEDED FOR ALLERGIES (Patient taking differently: Take 10 mg by mouth daily.)  ? Multiple Vitamin (MULTIVITAMIN WITH MINERALS) TABS tablet Take 1 tablet by mouth daily.  ? nitroGLYCERIN (NITROSTAT) 0.4 MG SL tablet DISSOLVE 1 TABLET UNDER THE TONGUE EVERY 5 MINUTES FOR 3 DOSES AS NEEDED FOR CHEST PAIN (Patient taking differently: Place 0.4 mg under the tongue every 5 (five) minutes as needed for chest pain.)  ? pantoprazole (PROTONIX) 40  MG tablet TAKE 1 TABLET EVERY DAY (Patient taking differently: Take 40 mg by mouth daily.)  ? potassium chloride SA (KLOR-CON M) 20 MEQ tablet TAKE 1 TABLET EVERY DAY (Patient taking differently: Take 20 mEq by mouth daily.)  ?

## 2021-07-03 NOTE — Anesthesia Postprocedure Evaluation (Signed)
Anesthesia Post Note ? ?Patient: Billy [name] ? ?Procedure(s) Performed: CLOSED REDUCTION HIP with placement of skeletal traction (Left: Hip) ? ?  ? ?Patient location during evaluation: PACU ?Anesthesia Type: General ?Level of consciousness: awake and alert ?Pain management: pain level controlled ?Vital Signs Assessment: post-procedure vital signs reviewed and stable ?Respiratory status: spontaneous breathing, nonlabored ventilation, respiratory function stable and patient connected to nasal cannula oxygen ?Cardiovascular status: blood pressure returned to baseline and stable ?Postop Assessment: no apparent nausea or vomiting ?Anesthetic complications: no ? ? ?No notable events documented. ? ?Last Vitals:  ?Vitals:  ? 07/03/21 0924 07/03/21 1725  ?BP: 107/66 107/71  ?Pulse: 60 62  ?Resp: 18 18  ?Temp: 36.7 ?C 37 ?C  ?SpO2: 92% 93%  ?  ?Last Pain:  ?Vitals:  ? 07/03/21 1725  ?TempSrc: Oral  ?PainSc:   ? ? ?  ?  ?  ?  ?  ?  ? ?[place] S ? ? ? ? ?

## 2021-07-04 ENCOUNTER — Inpatient Hospital Stay (HOSPITAL_COMMUNITY): Payer: Medicare HMO

## 2021-07-04 ENCOUNTER — Inpatient Hospital Stay (HOSPITAL_COMMUNITY): Payer: Medicare HMO | Admitting: Anesthesiology

## 2021-07-04 ENCOUNTER — Institutional Professional Consult (permissible substitution): Payer: Medicare HMO | Admitting: Adult Health

## 2021-07-04 ENCOUNTER — Telehealth: Payer: Self-pay | Admitting: Adult Health

## 2021-07-04 ENCOUNTER — Encounter (HOSPITAL_COMMUNITY): Payer: Self-pay | Admitting: Orthopedic Surgery

## 2021-07-04 ENCOUNTER — Other Ambulatory Visit: Payer: Self-pay

## 2021-07-04 ENCOUNTER — Encounter (HOSPITAL_COMMUNITY)
Admission: EM | Disposition: A | Discharge status: Home Health | Payer: Self-pay | Source: Home / Self Care | Attending: Family Medicine

## 2021-07-04 DIAGNOSIS — S32402A Unspecified fracture of left acetabulum, initial encounter for closed fracture: Secondary | ICD-10-CM | POA: Diagnosis not present

## 2021-07-04 DIAGNOSIS — S32422A Displaced fracture of posterior wall of left acetabulum, initial encounter for closed fracture: Secondary | ICD-10-CM

## 2021-07-04 DIAGNOSIS — I10 Essential (primary) hypertension: Secondary | ICD-10-CM | POA: Diagnosis not present

## 2021-07-04 DIAGNOSIS — S73005A Unspecified dislocation of left hip, initial encounter: Secondary | ICD-10-CM | POA: Diagnosis not present

## 2021-07-04 DIAGNOSIS — I110 Hypertensive heart disease with heart failure: Secondary | ICD-10-CM

## 2021-07-04 DIAGNOSIS — I5022 Chronic systolic (congestive) heart failure: Secondary | ICD-10-CM | POA: Diagnosis not present

## 2021-07-04 DIAGNOSIS — Z87891 Personal history of nicotine dependence: Secondary | ICD-10-CM

## 2021-07-04 DIAGNOSIS — I509 Heart failure, unspecified: Secondary | ICD-10-CM

## 2021-07-04 DIAGNOSIS — I25119 Atherosclerotic heart disease of native coronary artery with unspecified angina pectoris: Secondary | ICD-10-CM

## 2021-07-04 HISTORY — PX: OPEN REDUCTION INTERNAL FIXATION ACETABULUM FRACTURE POSTERIOR: SHX6833

## 2021-07-04 LAB — CBC
HCT: 37.5 % — ABNORMAL LOW (ref 39.0–52.0)
Hemoglobin: 12.8 g/dL — ABNORMAL LOW (ref 13.0–17.0)
MCH: 28.2 pg (ref 26.0–34.0)
MCHC: 34.1 g/dL (ref 30.0–36.0)
MCV: 82.6 fL (ref 80.0–100.0)
Platelets: 201 10*3/uL (ref 150–400)
RBC: 4.54 MIL/uL (ref 4.22–5.81)
RDW: 14.5 % (ref 11.5–15.5)
WBC: 9.3 10*3/uL (ref 4.0–10.5)
nRBC: 0 % (ref 0.0–0.2)

## 2021-07-04 LAB — MAGNESIUM: Magnesium: 1.8 mg/dL (ref 1.7–2.4)

## 2021-07-04 LAB — BASIC METABOLIC PANEL
Anion gap: 5 (ref 5–15)
BUN: 12 mg/dL (ref 6–20)
CO2: 27 mmol/L (ref 22–32)
Calcium: 8.7 mg/dL — ABNORMAL LOW (ref 8.9–10.3)
Chloride: 103 mmol/L (ref 98–111)
Creatinine, Ser: 1.35 mg/dL — ABNORMAL HIGH (ref 0.61–1.24)
GFR, Estimated: >60 mL/min (ref >60)
Glucose, Bld: 101 mg/dL — ABNORMAL HIGH (ref 70–99)
Potassium: 3.6 mmol/L (ref 3.5–5.1)
Sodium: 135 mmol/L (ref 135–145)

## 2021-07-04 LAB — APTT: aPTT: 36 seconds (ref 24–36)

## 2021-07-04 LAB — PROTIME-INR
INR: 1.2 (ref 0.8–1.2)
Prothrombin Time: 15.3 seconds — ABNORMAL HIGH (ref 11.4–15.2)

## 2021-07-04 LAB — TSH: TSH: 1.936 u[IU]/mL (ref 0.350–4.500)

## 2021-07-04 SURGERY — OPEN REDUCTION INTERNAL FIXATION ACETABULUM FRACTURE POSTERIOR
Anesthesia: General | Site: Hip | Laterality: Left

## 2021-07-04 MED ORDER — ONDANSETRON HCL 4 MG/2ML IJ SOLN: 4.0000 mg | Freq: Four times a day (QID) | INTRAMUSCULAR | Status: DC | PRN

## 2021-07-04 MED ORDER — CLOPIDOGREL BISULFATE 75 MG PO TABS
75.0000 mg | ORAL_TABLET | Freq: Every day | ORAL | Status: DC
  Administered 2021-07-05 – 2021-07-08 (×4): 75 mg via ORAL
  Filled 2021-07-04 (×5): qty 1

## 2021-07-04 MED ORDER — ZINC SULFATE 220 (50 ZN) MG PO CAPS
220.0000 mg | ORAL_CAPSULE | Freq: Every day | ORAL | Status: DC
  Administered 2021-07-04 – 2021-07-08 (×5): 220 mg via ORAL
  Filled 2021-07-04 (×5): qty 1

## 2021-07-04 MED ORDER — HYDROMORPHONE HCL 1 MG/ML IJ SOLN
1.0000 mg | INTRAMUSCULAR | Status: DC | PRN
  Administered 2021-07-04: 1 mg via INTRAVENOUS
  Administered 2021-07-04 – 2021-07-05 (×2): 2 mg via INTRAVENOUS
  Administered 2021-07-05: 1 mg via INTRAVENOUS
  Administered 2021-07-06: 2 mg via INTRAVENOUS
  Administered 2021-07-06 – 2021-07-08 (×3): 1 mg via INTRAVENOUS
  Filled 2021-07-04 (×2): qty 2
  Filled 2021-07-04 (×2): qty 1
  Filled 2021-07-04: qty 2
  Filled 2021-07-04 (×3): qty 1

## 2021-07-04 MED ORDER — FENTANYL CITRATE (PF) 250 MCG/5ML IJ SOLN
INTRAMUSCULAR | Status: DC | PRN
Start: 2021-07-04 — End: 2021-07-04
  Administered 2021-07-04: 50 ug via INTRAVENOUS
  Administered 2021-07-04: 150 ug via INTRAVENOUS
  Administered 2021-07-04 (×5): 50 ug via INTRAVENOUS

## 2021-07-04 MED ORDER — FENTANYL CITRATE (PF) 250 MCG/5ML IJ SOLN
INTRAMUSCULAR | Status: AC
  Filled 2021-07-04: qty 5

## 2021-07-04 MED ORDER — VITAMIN D (ERGOCALCIFEROL) 1.25 MG (50000 UNIT) PO CAPS
50000.0000 [IU] | ORAL_CAPSULE | ORAL | Status: DC
  Administered 2021-07-04: 50000 [IU] via ORAL
  Filled 2021-07-04: qty 1

## 2021-07-04 MED ORDER — PHENYLEPHRINE 40 MCG/ML (10ML) SYRINGE FOR IV PUSH (FOR BLOOD PRESSURE SUPPORT)
PREFILLED_SYRINGE | INTRAVENOUS | Status: DC | PRN
  Administered 2021-07-04 (×3): 80 ug via INTRAVENOUS
  Administered 2021-07-04: 160 ug via INTRAVENOUS

## 2021-07-04 MED ORDER — HYDROMORPHONE HCL 1 MG/ML IJ SOLN
INTRAMUSCULAR | Status: AC
  Filled 2021-07-04: qty 0.5

## 2021-07-04 MED ORDER — ONDANSETRON HCL 4 MG/2ML IJ SOLN
INTRAMUSCULAR | Status: AC
  Filled 2021-07-04: qty 2

## 2021-07-04 MED ORDER — LIDOCAINE 2% (20 MG/ML) 5 ML SYRINGE
INTRAMUSCULAR | Status: DC | PRN
  Administered 2021-07-04: 80 mg via INTRAVENOUS

## 2021-07-04 MED ORDER — LACTATED RINGERS IV SOLN
INTRAVENOUS | Status: DC | PRN
Start: 2021-07-04 — End: 2021-07-04

## 2021-07-04 MED ORDER — ORAL CARE MOUTH RINSE: 15.0000 mL | Freq: Once | OROMUCOSAL | Status: AC

## 2021-07-04 MED ORDER — ROCURONIUM BROMIDE 10 MG/ML (PF) SYRINGE
PREFILLED_SYRINGE | INTRAVENOUS | Status: DC | PRN
  Administered 2021-07-04: 70 mg via INTRAVENOUS
  Administered 2021-07-04 (×2): 30 mg via INTRAVENOUS

## 2021-07-04 MED ORDER — MIDAZOLAM HCL 5 MG/5ML IJ SOLN
INTRAMUSCULAR | Status: DC | PRN
  Administered 2021-07-04: 2 mg via INTRAVENOUS

## 2021-07-04 MED ORDER — EPHEDRINE SULFATE-NACL 50-0.9 MG/10ML-% IV SOSY
PREFILLED_SYRINGE | INTRAVENOUS | Status: DC | PRN
Start: 2021-07-04 — End: 2021-07-04
  Administered 2021-07-04: 5 mg via INTRAVENOUS
  Administered 2021-07-04: 10 mg via INTRAVENOUS

## 2021-07-04 MED ORDER — ACETAMINOPHEN 10 MG/ML IV SOLN
1000.0000 mg | Freq: Four times a day (QID) | INTRAVENOUS | Status: AC
  Administered 2021-07-04 – 2021-07-05 (×4): 1000 mg via INTRAVENOUS
  Filled 2021-07-04 (×4): qty 100

## 2021-07-04 MED ORDER — PROPOFOL 10 MG/ML IV BOLUS
INTRAVENOUS | Status: AC
Start: 2021-07-04 — End: ?
  Filled 2021-07-04: qty 20

## 2021-07-04 MED ORDER — FENTANYL CITRATE (PF) 100 MCG/2ML IJ SOLN: 25.0000 ug | INTRAMUSCULAR | Status: DC | PRN

## 2021-07-04 MED ORDER — ASCORBIC ACID 500 MG PO TABS
500.0000 mg | ORAL_TABLET | Freq: Every day | ORAL | Status: DC
  Administered 2021-07-04 – 2021-07-08 (×5): 500 mg via ORAL
  Filled 2021-07-04 (×5): qty 1

## 2021-07-04 MED ORDER — 0.9 % SODIUM CHLORIDE (POUR BTL) OPTIME
TOPICAL | Status: DC | PRN
  Administered 2021-07-04: 1000 mL

## 2021-07-04 MED ORDER — SUGAMMADEX SODIUM 500 MG/5ML IV SOLN
INTRAVENOUS | Status: AC
  Filled 2021-07-04: qty 5

## 2021-07-04 MED ORDER — CALCIUM CITRATE 950 (200 CA) MG PO TABS
200.0000 mg | ORAL_TABLET | Freq: Two times a day (BID) | ORAL | Status: DC
  Administered 2021-07-04 – 2021-07-08 (×9): 200 mg via ORAL
  Filled 2021-07-04 (×11): qty 1

## 2021-07-04 MED ORDER — HYDROMORPHONE HCL 1 MG/ML IJ SOLN
INTRAMUSCULAR | Status: DC | PRN
  Administered 2021-07-04: .5 mg via INTRAVENOUS

## 2021-07-04 MED ORDER — METOCLOPRAMIDE HCL 5 MG PO TABS: 5.0000 mg | ORAL_TABLET | Freq: Three times a day (TID) | ORAL | Status: DC | PRN

## 2021-07-04 MED ORDER — PROPOFOL 10 MG/ML IV BOLUS
INTRAVENOUS | Status: DC | PRN
  Administered 2021-07-04: 170 mg via INTRAVENOUS
  Administered 2021-07-04: 30 mg via INTRAVENOUS

## 2021-07-04 MED ORDER — CALCIUM CHLORIDE 10 % IV SOLN
INTRAVENOUS | Status: DC | PRN
Start: 2021-07-04 — End: 2021-07-04
  Administered 2021-07-04: 200 mg via INTRAVENOUS

## 2021-07-04 MED ORDER — SUCCINYLCHOLINE CHLORIDE 200 MG/10ML IV SOSY
PREFILLED_SYRINGE | INTRAVENOUS | Status: DC | PRN
  Administered 2021-07-04: 160 mg via INTRAVENOUS

## 2021-07-04 MED ORDER — LACTATED RINGERS IV SOLN: INTRAVENOUS | Status: DC

## 2021-07-04 MED ORDER — ONDANSETRON HCL 4 MG/2ML IJ SOLN
INTRAMUSCULAR | Status: DC | PRN
  Administered 2021-07-04: 4 mg via INTRAVENOUS

## 2021-07-04 MED ORDER — METOCLOPRAMIDE HCL 5 MG/ML IJ SOLN: 5.0000 mg | Freq: Three times a day (TID) | INTRAMUSCULAR | Status: DC | PRN

## 2021-07-04 MED ORDER — MIDAZOLAM HCL 2 MG/2ML IJ SOLN
INTRAMUSCULAR | Status: AC
Start: 2021-07-04 — End: ?
  Filled 2021-07-04: qty 2

## 2021-07-04 MED ORDER — CEFAZOLIN SODIUM-DEXTROSE 2-4 GM/100ML-% IV SOLN
2.0000 g | Freq: Four times a day (QID) | INTRAVENOUS | Status: AC
  Administered 2021-07-04 – 2021-07-05 (×3): 2 g via INTRAVENOUS
  Filled 2021-07-04 (×3): qty 100

## 2021-07-04 MED ORDER — PROPOFOL 10 MG/ML IV BOLUS
INTRAVENOUS | Status: AC
  Filled 2021-07-04: qty 20

## 2021-07-04 MED ORDER — OXYCODONE HCL 5 MG PO TABS
5.0000 mg | ORAL_TABLET | ORAL | Status: DC | PRN
  Administered 2021-07-04 – 2021-07-08 (×18): 10 mg via ORAL
  Filled 2021-07-04 (×18): qty 2

## 2021-07-04 MED ORDER — ROCURONIUM BROMIDE 10 MG/ML (PF) SYRINGE
PREFILLED_SYRINGE | INTRAVENOUS | Status: AC
  Filled 2021-07-04: qty 20

## 2021-07-04 MED ORDER — CHLORHEXIDINE GLUCONATE CLOTH 2 % EX PADS
6.0000 | MEDICATED_PAD | Freq: Every day | CUTANEOUS | Status: DC
  Administered 2021-07-05 – 2021-07-07 (×3): 6 via TOPICAL

## 2021-07-04 MED ORDER — VANCOMYCIN HCL 1000 MG IV SOLR
INTRAVENOUS | Status: DC | PRN
Start: 2021-07-04 — End: 2021-07-04
  Administered 2021-07-04: 1000 mg via TOPICAL

## 2021-07-04 MED ORDER — CHLORHEXIDINE GLUCONATE 0.12 % MT SOLN
15.0000 mL | Freq: Once | OROMUCOSAL | Status: AC
  Administered 2021-07-04: 15 mL via OROMUCOSAL

## 2021-07-04 MED ORDER — PHENYLEPHRINE HCL-NACL 20-0.9 MG/250ML-% IV SOLN
INTRAVENOUS | Status: DC | PRN
  Administered 2021-07-04: 40 ug/min via INTRAVENOUS

## 2021-07-04 MED ORDER — VANCOMYCIN HCL 1000 MG IV SOLR
INTRAVENOUS | Status: AC
  Filled 2021-07-04: qty 20

## 2021-07-04 MED ORDER — ONDANSETRON HCL 4 MG PO TABS: 4.0000 mg | ORAL_TABLET | Freq: Four times a day (QID) | ORAL | Status: DC | PRN

## 2021-07-04 MED ORDER — DEXAMETHASONE SODIUM PHOSPHATE 10 MG/ML IJ SOLN
INTRAMUSCULAR | Status: AC
  Filled 2021-07-04: qty 1

## 2021-07-04 MED ORDER — VITAMIN D 25 MCG (1000 UNIT) PO TABS
2000.0000 [IU] | ORAL_TABLET | Freq: Two times a day (BID) | ORAL | Status: DC
Start: 2021-07-04 — End: 2021-07-08
  Administered 2021-07-04 – 2021-07-08 (×8): 2000 [IU] via ORAL
  Filled 2021-07-04 (×10): qty 2

## 2021-07-04 MED ORDER — SUGAMMADEX SODIUM 500 MG/5ML IV SOLN
INTRAVENOUS | Status: DC | PRN
  Administered 2021-07-04: 250 mg via INTRAVENOUS

## 2021-07-04 MED ORDER — DEXAMETHASONE SODIUM PHOSPHATE 10 MG/ML IJ SOLN
INTRAMUSCULAR | Status: DC | PRN
Start: 2021-07-04 — End: 2021-07-04
  Administered 2021-07-04 (×2): 10 mg via INTRAVENOUS

## 2021-07-04 MED ORDER — DOCUSATE SODIUM 100 MG PO CAPS
100.0000 mg | ORAL_CAPSULE | Freq: Two times a day (BID) | ORAL | Status: DC
  Administered 2021-07-04 – 2021-07-08 (×8): 100 mg via ORAL
  Filled 2021-07-04 (×8): qty 1

## 2021-07-04 SURGICAL SUPPLY — 69 items
BAG COUNTER SPONGE SURGICOUNT (BAG) ×2 IMPLANT
BIT DRILL AO MATTA 2.5MX230M (BIT) IMPLANT
BIT DRILL STEP 3.5 (DRILL) IMPLANT
BRUSH SCRUB EZ PLAIN DRY (MISCELLANEOUS) ×4 IMPLANT
COVER SURGICAL LIGHT HANDLE (MISCELLANEOUS) ×2 IMPLANT
DRAPE C-ARM 42X72 X-RAY (DRAPES) ×2 IMPLANT
DRAPE C-ARMOR (DRAPES) ×2 IMPLANT
DRAPE INCISE IOBAN 66X45 STRL (DRAPES) ×1 IMPLANT
DRAPE INCISE IOBAN 85X60 (DRAPES) ×2 IMPLANT
DRAPE ORTHO SPLIT 77X108 STRL (DRAPES) ×4
DRAPE SURG ORHT 6 SPLT 77X108 (DRAPES) ×2 IMPLANT
DRAPE U-SHAPE 47X51 STRL (DRAPES) ×2 IMPLANT
DRILL BIT AO MATTA 2.5MX230M (BIT) ×2
DRILL STEP 3.5 (DRILL)
DRSG AQUACEL AG ADV 3.5X14 (GAUZE/BANDAGES/DRESSINGS) ×1 IMPLANT
DRSG MEPILEX BORDER 4X12 (GAUZE/BANDAGES/DRESSINGS) IMPLANT
DRSG MEPILEX BORDER 4X8 (GAUZE/BANDAGES/DRESSINGS) IMPLANT
ELECT BLADE 6.5 EXT (BLADE) ×2 IMPLANT
ELECT REM PT RETURN 9FT ADLT (ELECTROSURGICAL) ×2
ELECTRODE REM PT RTRN 9FT ADLT (ELECTROSURGICAL) ×1 IMPLANT
GLOVE SRG 8 PF TXTR STRL LF DI (GLOVE) ×1 IMPLANT
GLOVE SURG ENC MOIS LTX SZ8 (GLOVE) ×2 IMPLANT
GLOVE SURG ORTHO LTX SZ7.5 (GLOVE) ×4 IMPLANT
GLOVE SURG UNDER POLY LF SZ7.5 (GLOVE) ×2 IMPLANT
GLOVE SURG UNDER POLY LF SZ8 (GLOVE) ×2
GOWN STRL REUS W/ TWL LRG LVL3 (GOWN DISPOSABLE) ×2 IMPLANT
GOWN STRL REUS W/ TWL XL LVL3 (GOWN DISPOSABLE) ×2 IMPLANT
GOWN STRL REUS W/TWL LRG LVL3 (GOWN DISPOSABLE) ×4
GOWN STRL REUS W/TWL XL LVL3 (GOWN DISPOSABLE) ×4
HANDPIECE INTERPULSE COAX TIP (DISPOSABLE) ×2
KIT BASIN OR (CUSTOM PROCEDURE TRAY) ×2 IMPLANT
KIT TURNOVER KIT B (KITS) ×2 IMPLANT
MANIFOLD NEPTUNE II (INSTRUMENTS) ×2 IMPLANT
NS IRRIG 1000ML POUR BTL (IV SOLUTION) ×2 IMPLANT
PACK TOTAL JOINT (CUSTOM PROCEDURE TRAY) ×2 IMPLANT
PAD ARMBOARD 7.5X6 YLW CONV (MISCELLANEOUS) ×5 IMPLANT
PILLOW ABDUCTION MEDIUM (MISCELLANEOUS) ×1 IMPLANT
PIN APEX 6X180MM EXFIX (EXFIX) ×1 IMPLANT
PLATE ACET STRT 94.5M 8H (Plate) ×1 IMPLANT
PLATE SPRING 3.5MM 3H STRL (Plate) ×2 IMPLANT
RETRIEVER SUT HEWSON (MISCELLANEOUS) ×2 IMPLANT
SCREW CORT 2.5XFT 44X3.5XST (Screw) IMPLANT
SCREW CORTEX ST MATTA 3.5X26MM (Screw) ×1 IMPLANT
SCREW CORTEX ST MATTA 3.5X34MM (Screw) ×1 IMPLANT
SCREW CORTEX ST MATTA 3.5X36MM (Screw) ×1 IMPLANT
SCREW CORTEX ST MATTA 3.5X38M (Screw) ×1 IMPLANT
SCREW CORTEX ST MATTA 3.5X40MM (Screw) ×1 IMPLANT
SCREW CORTICAL 3.5X44MM (Screw) ×2 IMPLANT
SET HNDPC FAN SPRY TIP SCT (DISPOSABLE) ×1 IMPLANT
SPONGE T-LAP 18X18 ~~LOC~~+RFID (SPONGE) ×4 IMPLANT
STAPLER VISISTAT 35W (STAPLE) ×2 IMPLANT
STRIP CLOSURE SKIN 1/2X4 (GAUZE/BANDAGES/DRESSINGS) ×1 IMPLANT
SUCTION FRAZIER HANDLE 10FR (MISCELLANEOUS) ×2
SUCTION TUBE FRAZIER 10FR DISP (MISCELLANEOUS) ×1 IMPLANT
SUT ETHILON 2 0 PSLX (SUTURE) ×3 IMPLANT
SUT FIBERWIRE #2 38 T-5 BLUE (SUTURE) ×4
SUT VIC AB 0 CT1 27 (SUTURE) ×2
SUT VIC AB 0 CT1 27XBRD ANBCTR (SUTURE) ×1 IMPLANT
SUT VIC AB 1 CT1 18XCR BRD 8 (SUTURE) ×1 IMPLANT
SUT VIC AB 1 CT1 27 (SUTURE) ×2
SUT VIC AB 1 CT1 27XBRD ANBCTR (SUTURE) ×1 IMPLANT
SUT VIC AB 1 CT1 8-18 (SUTURE) ×2
SUT VIC AB 2-0 CT1 27 (SUTURE) ×2
SUT VIC AB 2-0 CT1 TAPERPNT 27 (SUTURE) ×1 IMPLANT
SUTURE FIBERWR #2 38 T-5 BLUE (SUTURE) ×2 IMPLANT
TOWEL GREEN STERILE (TOWEL DISPOSABLE) ×4 IMPLANT
TOWEL GREEN STERILE FF (TOWEL DISPOSABLE) ×4 IMPLANT
TRAY FOLEY MTR SLVR 16FR STAT (SET/KITS/TRAYS/PACK) ×1 IMPLANT
WATER STERILE IRR 1000ML POUR (IV SOLUTION) IMPLANT

## 2021-07-04 NOTE — Anesthesia Procedure Notes (Signed)
Arterial Line Insertion ?Start/End4/06/2021 8:45 AM, 07/04/2021 9:00 AM ?Performed by: Belinda Block, MD, anesthesiologist ? Patient location: OR. ?Preanesthetic checklist: patient identified, IV checked, site marked, risks and benefits discussed, surgical consent, monitors and equipment checked, pre-op evaluation and timeout performed ?Lidocaine 1% used for infiltration and patient sedated ?Right, radial was placed ?Catheter size: 20 G ?Hand hygiene performed , maximum sterile barriers used  and Seldinger technique used ?Allen's test indicative of satisfactory collateral circulation ?Procedure performed using ultrasound guided technique. ?Ultrasound Notes:anatomy identified ?Following insertion, dressing applied and Biopatch. ?Patient tolerated the procedure well with no immediate complications. ? ? ?

## 2021-07-04 NOTE — Plan of Care (Signed)
  Problem: Pain Managment: Goal: General experience of comfort will improve Outcome: Progressing   Problem: Safety: Goal: Ability to remain free from injury will improve Outcome: Progressing   

## 2021-07-04 NOTE — Telephone Encounter (Signed)
I called the patient wife (DPR) and she voices understanding. She will call back to make a follow up for a consult in the office after he is doing better and recovered well. Nothing further needed.  ?

## 2021-07-04 NOTE — Care Management Important Message (Signed)
Important Message ? ?Patient Details  ?Name: Billy [name] ?MRN: [id number] ?Date of Birth: 1965-08-29 ? ? ?Medicare Important Message Given:  Yes ? ? ? ? ?Levada Dy  Magdelene Ruark-Martin ?07/04/2021, 3:44 PM ?

## 2021-07-04 NOTE — Progress Notes (Signed)
? ?Progress Note ? ?Patient Name: Billy [name] ?Date of Encounter: 07/04/2021 ? ?Primary Cardiologist:   Kirk Ruths, MD ? ? ?Subjective  ? ?No SOB.  ? ?Inpatient Medications  ?  ?Scheduled Meds: ? vitamin C  500 mg Oral Daily  ? atorvastatin  80 mg Oral Daily  ? calcium citrate  200 mg of elemental calcium Oral BID  ? carvedilol  12.5 mg Oral BID WC  ? [START ON 07/05/2021] Chlorhexidine Gluconate Cloth  6 each Topical Daily  ? cholecalciferol  2,000 Units Oral BID  ? [START ON 07/05/2021] clopidogrel  75 mg Oral Daily  ? docusate sodium  100 mg Oral BID  ? ezetimibe  10 mg Oral Daily  ? furosemide  40 mg Oral Daily  ? isosorbide mononitrate  30 mg Oral Daily  ? levothyroxine  50 mcg Oral QAC breakfast  ? methocarbamol  1,000 mg Oral TID  ? montelukast  10 mg Oral Daily  ? mupirocin ointment  1 application. Nasal BID  ? pantoprazole  40 mg Oral Daily  ? potassium chloride SA  20 mEq Oral Daily  ? sacubitril-valsartan  1 tablet Oral BID  ? Vitamin D (Ergocalciferol)  50,000 Units Oral Q7 days  ? zinc sulfate  220 mg Oral Daily  ? ?Continuous Infusions: ? acetaminophen 1,000 mg (07/04/21 1604)  ?  ceFAZolin (ANCEF) IV    ? ?PRN Meds: ?acetaminophen **OR** acetaminophen, HYDROmorphone (DILAUDID) injection, metoCLOPramide **OR** metoCLOPramide (REGLAN) injection, ondansetron **OR** ondansetron (ZOFRAN) IV, oxyCODONE  ? ?Vital Signs  ?  ?Vitals:  ? 07/04/21 1300 07/04/21 1307 07/04/21 1423 07/04/21 1641  ?BP:  (!) 146/88 134/85 114/72  ?Pulse: 71 71 66 66  ?Resp: '14 16 19 19  '$ ?Temp:  98.3 ?F (36.8 ?C) 98.2 ?F (36.8 ?C) 97.7 ?F (36.5 ?C)  ?TempSrc:   Oral Oral  ?SpO2: 94% 96% 100% 100%  ? ? ?Intake/Output Summary (Last 24 hours) at 07/04/2021 1705 ?Last data filed at 07/04/2021 1313 ?Gross per 24 hour  ?Intake 2400 ml  ?Output 1615 ml  ?Net 785 ml  ? ?There were no vitals filed for this visit. ? ?Telemetry  ?  ?NSR with PVCs - Personally Reviewed ? ?ECG  ?  ?NA - Personally Reviewed ? ?Physical Exam  ? ?GEN: No acute  distress.   ?Neck: No  JVD ?Cardiac: RRR,  no murmurs, rubs, or gallops.  ?Respiratory: Clear  to auscultation bilaterally. ?GI: Soft, nontender, non-distended  ?MS: No  edema; No deformity. ?Neuro:  Nonfocal  ?Psych: Normal affect  ? ?Labs  ?  ?Chemistry ?Recent Labs  ?Lab 07/01/21 ?2240 07/03/21 ?0052 07/04/21 ?0233  ?NA 141 136 135  ?K 3.8 4.1 3.6  ?CL 109 102 103  ?CO2 '24 29 27  '$ ?GLUCOSE 93 104* 101*  ?BUN '13 9 12  '$ ?CREATININE 1.41* 1.41* 1.35*  ?CALCIUM 9.0 8.8* 8.7*  ?GFRNONAA 59* 59* >60  ?ANIONGAP '8 5 5  '$ ?  ? ?Hematology ?Recent Labs  ?Lab 07/01/21 ?2240 07/04/21 ?0233  ?WBC 11.5* 9.3  ?RBC 5.74 4.54  ?HGB 16.0 12.8*  ?HCT 47.1 37.5*  ?MCV 82.1 82.6  ?MCH 27.9 28.2  ?MCHC 34.0 34.1  ?RDW 14.8 14.5  ?PLT 271 201  ? ? ?Cardiac EnzymesNo results for input(s): TROPONINI in the last 168 hours. No results for input(s): TROPIPOC in the last 168 hours.  ? ?BNP ?Recent Labs  ?Lab 07/03/21 ?1749  ?BNP 50.3  ?  ? ?DDimer No results for input(s): DDIMER in the last 168  hours.  ? ?Radiology  ?  ?CT 3D RECON AT [facility]AN[facility] ? ?Result Date: 07/03/2021 ?CLINICAL DATA:  Nonspecific (abnormal) findings on radiological and other examination of musculoskeletal system left acetabular fracture. EXAM: 3-DIMENSIONAL CT IMAGE RENDERING ON ACQUISITION WORKSTATION TECHNIQUE: 3-dimensional CT images were rendered by post-processing of the original CT data on an acquisition workstation. The 3-dimensional CT images were interpreted and findings were reported in the accompanying complete CT report for this study COMPARISON:  07/02/2021 FINDINGS: 3D surface reformatted images of the pelvis with comminuted left acetabular fracture. IMPRESSION: Comminuted left acetabular fracture. Electronically Signed   By: Davina Poke D.O.   On: 07/03/2021 17:13  ? ?DG C-Arm 1-60 Min-No Report ? ?Result Date: 07/04/2021 ?CLINICAL DATA:  A 56 year old male presents for follow-up of ORIF of the LEFT acetabulum. EXAM: DG HIP (WITH OR WITHOUT PELVIS) 2-3V LEFT; DG  C-ARM 1-60 MIN-NO REPORT COMPARISON:  July 02, 2021. FINDINGS: Comminuted LEFT acetabular fracture undergoing ORIF on intraoperative fluoroscopic views, total of 7 images are provided. Initial image shows reapproximation of fracture fragments. The femoral head projects over the acetabulum in more anatomic position no longer appearing subluxed on single view also similarly in better position on the lateral projection that is provided. Subsequent images show interval plate and screw fixation of the acetabular fracture. No immediate complication is seen on limited fluoroscopic intraoperative assessment. Fluoroscopic dose: 16.64 mGy Fluoroscopic time: 23 seconds IMPRESSION: Intraoperative fluoroscopic views undergoing ORIF LEFT acetabular fracture. No unexpected findings on limited intraoperative fluoroscopic assessment. Electronically Signed   By: Zetta Bills M.D.   On: 07/04/2021 11:58  ? ?DG C-Arm 1-60 Min-No Report ? ?Result Date: 07/04/2021 ?Fluoroscopy was utilized by the requesting physician.  No radiographic interpretation.  ? ?DG C-Arm 1-60 Min-No Report ? ?Result Date: 07/04/2021 ?Fluoroscopy was utilized by the requesting physician.  No radiographic interpretation.  ? ?DG HIP UNILAT WITH PELVIS 2-3 VIEWS LEFT ? ?Result Date: 07/04/2021 ?CLINICAL DATA:  A 56 year old male presents for follow-up of ORIF of the LEFT acetabulum. EXAM: DG HIP (WITH OR WITHOUT PELVIS) 2-3V LEFT; DG C-ARM 1-60 MIN-NO REPORT COMPARISON:  July 02, 2021. FINDINGS: Comminuted LEFT acetabular fracture undergoing ORIF on intraoperative fluoroscopic views, total of 7 images are provided. Initial image shows reapproximation of fracture fragments. The femoral head projects over the acetabulum in more anatomic position no longer appearing subluxed on single view also similarly in better position on the lateral projection that is provided. Subsequent images show interval plate and screw fixation of the acetabular fracture. No immediate  complication is seen on limited fluoroscopic intraoperative assessment. Fluoroscopic dose: 16.64 mGy Fluoroscopic time: 23 seconds IMPRESSION: Intraoperative fluoroscopic views undergoing ORIF LEFT acetabular fracture. No unexpected findings on limited intraoperative fluoroscopic assessment. Electronically Signed   By: Zetta Bills M.D.   On: 07/04/2021 11:58   ? ?Cardiac Studies  ? ?NA ? ?Patient Profile  ?   ?56 y.o. male with a hx of CAD (PCI to Cx age 62, CABGx4 in 2003, BMS to [place] in 12/2007, PTCA to Cx in 01/2019), VF arrest 02/2009 s/p ICD ([place]ott gen change 06/2021), paroxysmal VT, ischemic cardiomyopathy, chronic combined heart failure, PAF diagnosed in 2020, PSVT s/p ablation 06/2020, HTN, HLD, marijuana abuse, former tobacco abuse, PAD s/p prior stent to REIA, CKD stage 3a by labs, prior C diff infection who is being seen 07/03/2021 for the evaluation of preoperative evaluation at the request of Dr. Bonner Puna. ? ?Assessment & Plan  ?  ?HIP FRACTURE:  Status post open reduction  and internal fixation of acetabulum fracture.  Doing well.    ? ?CHRONIC COMBINED SYSTOLIC HF:  Seems to be euvolemic.  Continue current meds.    ? ?ICD:  I don't see any documentation around device management with surgery.  I will contact industry in the AM to make sure no interrogation is necessary.  Wound looks OK.   ? ?PAF:  NSR.  Restart DOAC when OK with surgery.   ? ?For questions or updates, please contact [place] ?Please consult [email or web address] for contact info under Cardiology/STEMI. ?  ?Signed, ?Minus Breeding, MD  ?07/04/2021, 5:05 PM   ? ?

## 2021-07-04 NOTE — Transfer of Care (Signed)
Immediate Anesthesia Transfer of Care Note ? ?Patient: Amier Hoyt ? ?Procedure(s) Performed: OPEN REDUCTION INTERNAL FIXATION ACETABULUM FRACTURE POSTERIOR (Left: Hip) ? ?Patient Location: PACU ? ?Anesthesia Type:General ? ?Level of Consciousness: awake, alert , oriented and patient cooperative ? ?Airway & Oxygen Therapy: Patient Spontanous Breathing and Patient connected to face mask oxygen ? ?Post-op Assessment: Report given to RN and Post -op Vital signs reviewed and stable ? ?Post vital signs: Reviewed and stable ? ?Last Vitals:  ?Vitals Value Taken Time  ?BP 125/78 07/04/21 1237  ?Temp    ?Pulse 79 07/04/21 1239  ?Resp 20 07/04/21 1239  ?SpO2 95 % 07/04/21 1239  ?Vitals shown include unvalidated device data. ? ?Last Pain:  ?Vitals:  ? 07/04/21 0438  ?TempSrc: Oral  ?PainSc:   ?   ? ?  ? ?Complications: No notable events documented. ?

## 2021-07-04 NOTE — Telephone Encounter (Signed)
Would be best to reschedule consult appointment to when he has recovered from surgery and is chronic, stable condition. ?

## 2021-07-04 NOTE — Consult Note (Signed)
? ?  [facility] CM Inpatient Consult ? ? ?07/04/2021 ? ?Billy [name] ?June 07, 1965 ?[id number] ? ?[place] Organization [ACO] Patient: [name] ? ?Primary Care Provider:  [name], MD, [place] [facility], is an embedded provider with a Chronic Care Management team and program, and is listed for the transition of care follow up and appointments. ? ?Patient was screened for Embedded practice service needs for chronic care management and patient is currently post op.  ? ?Plan: Continue to follow for post hospital needs. ? ?Please contact for further questions, ? ?Natividad Brood, RN BSN CCM ?[facility] Liaison ? [phone] business mobile phone ?Toll free office [phone]  ?Fax number: [phone] ?[email or web address] ? ? ? ?

## 2021-07-04 NOTE — Progress Notes (Signed)
Pt had ORIF procedure on left hip today.  Foley was placed during sx. Pt would like to know when cathter can come out. On call provider paged to see if it's ok to remove foley per pt request. See new orders.  ?

## 2021-07-04 NOTE — Anesthesia Procedure Notes (Signed)
Procedure Name: Intubation ?Date/Time: 07/04/2021 8:44 AM ?Performed by: Colin Benton, CRNA ?Pre-anesthesia Checklist: Patient identified, Emergency Drugs available, Suction available and Patient being monitored ?Patient Re-evaluated:Patient Re-evaluated prior to induction ?Oxygen Delivery Method: Circle system utilized ?Preoxygenation: Pre-oxygenation with 100% oxygen ?Induction Type: IV induction ?Ventilation: Mask ventilation without difficulty ?Laryngoscope Size: Glidescope and 4 ?Tube type: Oral ?Tube size: 7.5 mm ?Number of attempts: 1 ?Airway Equipment and Method: Rigid stylet and Video-laryngoscopy ?Placement Confirmation: ETT inserted through vocal cords under direct vision, positive ETCO2 and breath sounds checked- equal and bilateral ?Secured at: 23 cm ?Tube secured with: Tape ?Dental Injury: Teeth and Oropharynx as per pre-operative assessment  ?Difficulty Due To: Difficulty was anticipated and Difficult Airway- due to large tongue ? ? ? ? ?

## 2021-07-04 NOTE — Telephone Encounter (Signed)
This patient is having surgery today and the wife is asking if a sleep consult can be done at the hospital since the patient was told that he would need a CPAP.  I do not see any sleep test in our system. Please advise if he could get a sleep consult in hospital or if we need to re-schedule in the office? Since he was having surgery the wife was concerned about getting him the office for the consult. Please advise on next steps.  ?

## 2021-07-04 NOTE — Progress Notes (Signed)
?Progress Note ? ?Patient: Billy [name] OIN:[id number] DOB: Feb 08, 1966  ?DOA: 07/01/2021  DOS: 07/04/2021  ?  ?Brief hospital course: ?Jaquavian Asmar is a 56 y.o. male with a history of VF cardiac arrest s/p ICD placement 2010, chronic HFrEF, CAD s/p CABGx4 2003, BMS to [place] 2009, PAF s/p ablation 2022 who presented to the ED 3/31 after slipping in wet grass at home, falling with severe immediate left hip pain. XR confirmed dislocated left hip with comminuted acetabular fracture. Orthopedics was consulted, taken to OR for closed reduction of left hip and traction placement. Orthopedics recommended LLE traction through the weekend and performed definitive ORIF 4/3.  ? ?Assessment and Plan: ?* Closed left acetabular fracture ([place]) ?- Hold plavix and eliquis today, ok to restart tmrw per ortho. ?- Weight bearing as per ortho s/p ORIF 4/3. ?- Continue pain control as ordered. ? ?Dislocated hip, left, initial encounter [facility]) ?S/p closed reduction in OR with sedation 3/31 with subsequent subluxation. Per ortho. ? ?Stage 3a chronic kidney disease (CKD) ([name]) - baseline SCr 1.3-1.6 ?Stable. ?- Avoid nephrotoxins ? ?S/P quadruple vessel bypass ?CAD s/p remote CABGx4 2003, BMS in 2009 since then. He denies current or recent angina.  ?- Continue home BB, imdur, statin, zetia. ? ?GERD (gastroesophageal reflux disease) ?- Continue PPI. ? ?Implantable cardioverter-defibrillator (ICD) in situ ?Recent generator exchange. No shocks recently. Interrogated per cardiology. ? ?Essential hypertension ?Continue home meds. ? ?PAF (paroxysmal atrial fibrillation) ([name])-resolved as of 07/01/2021 ?In NSR, s/p SVT ablation 2022. ?- Continue coreg.  ?- Holding anticoagulation. CHA2DS2-VASc score is 2. Will restart anticoagulation once cleared to do so by orthopedics. Ordered for 4/4. ? ?Chronic HFrEF (heart failure with reduced ejection fraction) ([place]) ?No evidence of current decompensation. BNP wnl.  ?- Continue home medications including coreg,  lasix '40mg'$  daily, entresto.  ?- Appreciate cardiology recommendations regarding perioperative monitoring. ?- Consider spironolactone/SGLT2i at follow up ? ?Hypothyroidism ?- Continue synthroid. Last TSH was 4.63  ? ?Obesity: Estimated body mass index is 36.96 kg/m? as calculated from the following: ?  Height as of 06/17/21: '5\' 11"'$  (1.803 m). ?  Weight as of 06/17/21: 120.2 kg. ? ?Subjective: Seen just after surgery, drowsy but interactive, appropriate and pain is controlled. ? ?Objective: ?Vitals:  ? 07/04/21 1300 07/04/21 1307 07/04/21 1423 07/04/21 1641  ?BP:  (!) 146/88 134/85 114/72  ?Pulse: 71 71 66 66  ?Resp: '14 16 19 19  '$ ?Temp:  98.3 ?F (36.8 ?C) 98.2 ?F (36.8 ?C) 97.7 ?F (36.5 ?C)  ?TempSrc:   Oral Oral  ?SpO2: 94% 96% 100% 100%  ? ?Gen: 56 y.o. male in no distress ?Pulm: Nonlabored breathing supplemental O2 from the PACU. Clear. ?CV: Regular rate and rhythm. No murmur, rub, or gallop. No JVD, trace dependent edema. ?GI: Abdomen soft, protuberant, non-tender, non-distended, with normoactive bowel sounds.  ?Ext: Warm, no deformities, restricted LE's ?Skin: Dressing c/d/I, no other new rashes, lesions or ulcers on visualized skin. ?Neuro: Alert and oriented. No focal neurological deficits. ?Psych: Judgement and insight appear fair. Mood euthymic & affect congruent. Behavior is appropriate.   ? ?Data Personally reviewed: ? ?CBC: ?Recent Labs  ?Lab 07/01/21 ?2240 07/04/21 ?0233  ?WBC 11.5* 9.3  ?NEUTROABS 8.1*  --   ?HGB 16.0 12.8*  ?HCT 47.1 37.5*  ?MCV 82.1 82.6  ?PLT 271 201  ? ?Basic Metabolic Panel: ?Recent Labs  ?Lab 07/01/21 ?2240 07/03/21 ?0052 07/04/21 ?0233  ?NA 141 136 135  ?K 3.8 4.1 3.6  ?CL 109 102 103  ?CO2 '24 29 27  '$ ?  GLUCOSE 93 104* 101*  ?BUN '13 9 12  '$ ?CREATININE 1.41* 1.41* 1.35*  ?CALCIUM 9.0 8.8* 8.7*  ?MG  --   --  1.8  ? ?GFR: ?CrCl cannot be calculated (Unknown ideal weight.). ?Liver Function Tests: ?No results for input(s): AST, ALT, ALKPHOS, BILITOT, PROT, ALBUMIN in the last 168  hours. ?No results for input(s): LIPASE, AMYLASE in the last 168 hours. ?No results for input(s): AMMONIA in the last 168 hours. ?Coagulation Profile: ?Recent Labs  ?Lab 07/01/21 ?2240 07/04/21 ?0233  ?INR 1.2 1.2  ? ?Cardiac Enzymes: ?No results for input(s): CKTOTAL, CKMB, CKMBINDEX, TROPONINI in the last 168 hours. ?BNP (last 3 results) ?No results for input(s): PROBNP in the last 8760 hours. ?HbA1C: ?No results for input(s): HGBA1C in the last 72 hours. ?CBG: ?No results for input(s): GLUCAP in the last 168 hours. ?Lipid Profile: ?No results for input(s): CHOL, HDL, LDLCALC, TRIG, CHOLHDL, LDLDIRECT in the last 72 hours. ?Thyroid Function Tests: ?Recent Labs  ?  07/04/21 ?0233  ?TSH 1.936  ? ?Anemia Panel: ?No results for input(s): VITAMINB12, FOLATE, FERRITIN, TIBC, IRON, RETICCTPCT in the last 72 hours. ?Urine analysis: ?   ?Component Value Date/Time  ? [place] 11/29/2020 1018  ? APPEARANCEUR CLEAR 11/29/2020 1018  ? LABSPEC 1.010 11/29/2020 1018  ? PHURINE 6.0 11/29/2020 1018  ? GLUCOSEU NEGATIVE 11/29/2020 1018  ? [place] NEGATIVE 11/29/2020 1018  ? [place] NEGATIVE 11/29/2020 1018  ? BILIRUBINUR negative 06/06/2016 1012  ? [place] NEGATIVE 11/29/2020 1018  ? PROTEINUR negative 06/06/2016 1012  ? PROTEINUR 30 (A) 10/20/2015 1148  ? UROBILINOGEN 0.2 11/29/2020 1018  ? NITRITE NEGATIVE 11/29/2020 1018  ? LEUKOCYTESUR NEGATIVE 11/29/2020 1018  ? ?Recent Results (from the past 240 hour(s))  ?Surgical PCR screen     Status: None  ? Collection Time: 07/03/21  3:41 PM  ? Specimen: Nasal Mucosa; Nasal Swab  ?Result Value Ref Range Status  ? MRSA, PCR NEGATIVE NEGATIVE Final  ? Staphylococcus aureus NEGATIVE NEGATIVE Final  ?  Comment: (NOTE) ?The Xpert SA Assay (FDA approved for NASAL specimens in patients 77 ?years of age and older), is one component of a comprehensive ?surveillance program. It is not intended to diagnose infection nor to ?guide or monitor treatment. ?Performed at [facility], [place] [address] [place], [place] ?[address] ?  ?   ?CT 3D RECON AT SCANNER ? ?Result Date: 07/03/2021 ?CLINICAL DATA:  Nonspecific (abnormal) findings on radiological and other examination of musculoskeletal system left acetabular fracture. EXAM: 3-DIMENSIONAL CT IMAGE RENDERING ON ACQUISITION WORKSTATION TECHNIQUE: 3-dimensional CT images were rendered by post-processing of the original CT data on an acquisition workstation. The 3-dimensional CT images were interpreted and findings were reported in the accompanying complete CT report for this study COMPARISON:  07/02/2021 FINDINGS: 3D surface reformatted images of the pelvis with comminuted left acetabular fracture. IMPRESSION: Comminuted left acetabular fracture. Electronically Signed   By: Davina Poke D.O.   On: 07/03/2021 17:13  ? ?DG C-Arm 1-60 Min-No Report ? ?Result Date: 07/04/2021 ?CLINICAL DATA:  A 56 year old male presents for follow-up of ORIF of the LEFT acetabulum. EXAM: DG HIP (WITH OR WITHOUT PELVIS) 2-3V LEFT; DG C-ARM 1-60 MIN-NO REPORT COMPARISON:  July 02, 2021. FINDINGS: Comminuted LEFT acetabular fracture undergoing ORIF on intraoperative fluoroscopic views, total of 7 images are provided. Initial image shows reapproximation of fracture fragments. The femoral head projects over the acetabulum in more anatomic position no longer appearing subluxed on single view also similarly in better position  on the lateral projection that is provided. Subsequent images show interval plate and screw fixation of the acetabular fracture. No immediate complication is seen on limited fluoroscopic intraoperative assessment. Fluoroscopic dose: 16.64 mGy Fluoroscopic time: 23 seconds IMPRESSION: Intraoperative fluoroscopic views undergoing ORIF LEFT acetabular fracture. No unexpected findings on limited intraoperative fluoroscopic assessment. Electronically Signed   By: Zetta Bills M.D.   On: 07/04/2021 11:58  ? ?DG C-Arm 1-60 Min-No Report ? ?Result Date:  07/04/2021 ?Fluoroscopy was utilized by the requesting physician.  No radiographic interpretation.  ? ?DG C-Arm 1-60 Min-No Report ? ?Result Date: 07/04/2021 ?Fluoroscopy was utilized by the requesting physician.  No ra

## 2021-07-04 NOTE — TOC CAGE-AID Note (Signed)
Transition of Care (TOC) - CAGE-AID Screening ? ? ?Patient Details  ?Name: Billy [name] ?MRN: [id number] ?Date of Birth: 1966/02/11 ? ?Transition of Care (TOC) CM/SW Contact:    ?Karsen Nakanishi C Tarpley-Carter, LCSWA ?Phone Number: ?07/04/2021, 12:34 PM ? ? ?Clinical Narrative: ?Pt is unable to participate in Cage Aid. ?Pt is currently in surgery.  CSW will assess at a better time. ? ?Passenger transport manager, MSW, LCSW-A ?Pronouns:  She/Her/Hers ?[facility] HealthTransitions of Care ?Clinical Social Worker ?Direct Number:  [phone] ?[email or web address] ? ?CAGE-AID Screening: ?Substance Abuse Screening unable to be completed due to: : Patient unable to participate ? ?  ?  ?  ?  ?  ? ?  ? ?  ? ? ? ? ? ? ?

## 2021-07-04 NOTE — Anesthesia Postprocedure Evaluation (Signed)
Anesthesia Post Note ? ?Patient: Billy [name] ? ?Procedure(s) Performed: OPEN REDUCTION INTERNAL FIXATION ACETABULUM FRACTURE POSTERIOR (Left: Hip) ? ?  ? ?Patient location during evaluation: PACU ?Anesthesia Type: General ?Level of consciousness: awake ?Pain management: pain level controlled ?Respiratory status: spontaneous breathing ?Cardiovascular status: stable ?Postop Assessment: no apparent nausea or vomiting ?Anesthetic complications: no ? ? ?No notable events documented. ? ?Last Vitals:  ?Vitals:  ? 07/04/21 1300 07/04/21 1307  ?BP:  (!) 146/88  ?Pulse: 71 71  ?Resp: 14 16  ?Temp:  36.8 ?C  ?SpO2: 94% 96%  ?  ?Last Pain:  ?Vitals:  ? 07/04/21 1307  ?TempSrc:   ?PainSc: 0-No pain  ? ? ?  ?  ?  ?  ?  ?  ? ?Kaelei Wheeler ? ? ? ? ?

## 2021-07-04 NOTE — Anesthesia Preprocedure Evaluation (Addendum)
Anesthesia Evaluation  ?Patient identified by MRN, date of birth, ID band ?Patient awake ? ? ? ?Reviewed: ?Allergy & Precautions, NPO status , Patient's Chart, lab work & pertinent test results ? ?Airway ?Mallampati: II ? ?TM Distance: >3 FB ? ? ? ? Dental ?  ?Pulmonary ?former smoker,  ?  ?breath sounds clear to auscultation ? ? ? ? ? ? Cardiovascular ?hypertension, + angina + CAD, + Past MI, + Peripheral Vascular Disease and +CHF  ? ?Rhythm:Regular Rate:Normal ? ? ?  ?Neuro/Psych ?  ? GI/Hepatic ?Neg liver ROS, GERD  ,  ?Endo/Other  ?Hypothyroidism  ? Renal/GU ?Renal disease  ? ?  ?Musculoskeletal ? ? Abdominal ?  ?Peds ? Hematology ?  ?Anesthesia Other Findings ? ? Reproductive/Obstetrics ? ?  ? ? ? ? ? ? ? ? ? ? ? ? ? ?  ?  ? ? ? ? ? ? ? ? ?Anesthesia Physical ?Anesthesia Plan ? ?ASA: 3 ? ?Anesthesia Plan: General  ? ?Post-op Pain Management:   ? ?Induction: Intravenous ? ?PONV Risk Score and Plan: Treatment may vary due to age or medical condition, Ondansetron, Propofol infusion and Midazolam ? ?Airway Management Planned: Oral ETT ? ?Additional Equipment:  ? ?Intra-op Plan:  ? ?Post-operative Plan: Extubation in OR ? ?Informed Consent: I have reviewed the patients History and Physical, chart, labs and discussed the procedure including the risks, benefits and alternatives for the proposed anesthesia with the patient or authorized representative who has indicated his/her understanding and acceptance.  ? ? ? ?Dental advisory given ? ?Plan Discussed with: CRNA, Anesthesiologist and Surgeon ? ?Anesthesia Plan Comments:   ? ? ? ? ?Anesthesia Quick Evaluation ? ?

## 2021-07-04 NOTE — Progress Notes (Signed)
Orthopedic Tech Progress Note ?Patient Details:  ?Billy [name] ?May 31, 1965 ?[id number] ? ?Patient can not have [place]. Max pull down weight is 113.0 KG (250 LB) patient is 120.2 KG (264.5 LB) ? ?Patient ID: Billy [name], male   DOB: 02-01-1966, 56 y.o.   MRN: [id number] ? ?Billy [name] ?07/04/2021, 1:47 PM ? ?

## 2021-07-04 NOTE — Op Note (Signed)
12/20/2018 ? ?12:45 PM ? ?PATIENT:  Billy [name]  56 y.o. male ? ?PRE-OPERATIVE DIAGNOSIS:  LEFT POSTERIOR WALL ACETABULAR FRACTURE ? ?POST-OPERATIVE DIAGNOSIS:  LEFT POSTERIOR WALL ACETABULAR FRACTURE ? ?PROCEDURE:  Procedure(s): ?OPEN REDUCTION INTERNAL FIXATION (ORIF) LEFT POSTERIOR WALL ACETABULAR FRACTURE ?REMOVAL OF TRACTION PIN LEFT TIBIA ? ?SURGEON:  Surgeon(s) and Role: ?   Altamese Cale, MD - Primary ? ?PHYSICIAN ASSISTANT: Ainsley Spinner, PA-C ? ?ANESTHESIA:   general ? ?EBL:  150 mL  ? ?BLOOD ADMINISTERED:none ? ?DRAINS: none  ? ?LOCAL MEDICATIONS USED:  NONE ? ?SPECIMEN:  No Specimen ? ?DISPOSITION OF SPECIMEN:  N/A ? ?COUNTS:  YES ? ?TOURNIQUET:  * No tourniquets in log * ? ?DICTATION: .Note written in EPIC ? ?PLAN OF CARE: Admit to inpatient  ? ?PATIENT DISPOSITION:  PACU - hemodynamically stable. ?  ?Delay start of Pharmacological VTE agent (>24hrs) due to surgical blood loss or risk of bleeding: no ? ?BRIEF SUMMARY OF INDICATION FOR PROCEDURE: Patient injured in ground level fall with fracture dislocation. CT demonstrated displaced posterior wall fracture with marginal impaction and fragmentation of the rim. Dr. Tamera Punt performed closed reduction and percutaneous pin placement for traction on the night of injury. Given the location and complexity of the acetabular fracture, Dr. Tamera Punt asserted this was outside his scope of practice and that it would be in the best interest of the patient to have these injuries evaluated and treated by a fellowship trained orthopaedic traumatologist. Consequently, I was consulted to provide further evaluation and management. ?We discussed with the patient the risks and benefits of surgical treatment including infection, avascular necrosis, arthritis, nerve injury/ foot drop, vessel injury, malunion, nonunion, instability, DVT, PE, heart attack, stroke, heterotopic ossification, need for blood transfusion or further surgery including total hip arthroplasty.  These  risks were acknowledged and consent provided to proceed. ?  ?BRIEF SUMMARY OF PROCEDURE:  After administration of 2g of Ancef, the patient was taken to the operating room where general anesthesia was induced. I then prepped and removed the traction pin from the tibia. ? ?Patient was then was positioned LEFT side up with all prominences padded appropriately and axillary roll.  After thorough prep with Chlorhexidine wash and betadine scrub and paint, drapes were applied and time-out called. A standard Kocher-Langenbeck approach was made.  Because of the patient's morbid obesity, the case required two assistants and was more difficult and lengthy than is typically encountered.  Once we exposed the tensor, it was split in line with the skin incision and the deep Charnley retractor placed.  The hip was brought into abduction, extension and the ?knee in flexion to fully relax the sciatic nerve. We were careful to guard against applying pressure to the nerve during retraction and this was diligently watched ?throughout.  The short rotators were identified ?and divided near their insertion.  We evacuated the hematoma from the ?fracture site.  The retroacetabular space was cleared with Cobb and then distally along the ischium after using the short rotators to reflect the sciatic nerve.  The findings included two sections of the posterior superior rim, which were almost straight superior. No femoral head lesion was identified but visualization was very limited because of the fracture pattern. A Schanz pin was placed in the proximal femur and distraction pulled by my assistant while I irrigated the joint aggressively to rid it of potential third body wear.  The superior wall fragments were then ?brought down and reduced.  These were held provisionally with pins and ?then  two rim plates and posterior wall buttress plate applied, securing fixation in the ?Posterior column and superiorly in the retroacetabular space.  The wounds  were ?irrigated thoroughly after final images showed appropriate reduction, ?hardware placement, trajectory and length. ?  ?Ainsley Spinner, PA-C, and a PA student assisted me throughout and ?assistance was absolutely necessary.  Closure was performed in standard ?layered fashion using FiberWire for the short rotators and piriformis ?tendons back through bone tunnels.  The tensor was closed in line with the ?skin using a figure-of-eight #1 Vicryl and then 0 Vicryl for multiple ?layers of the deep adipose and 2-0 Vicryl and nylon for the skin. ?Sterile gently compressive dressing was applied.  The patient was then ?taken to the PACU in stable condition. ?  ?PROGNOSIS:   Reduction and integrity of the acetabulum has been restored. Because of the articular involvement, risk of arthritis is significantly elevated, which may eventually require a total hip arthroplasty. Because of the dislocation the risk of heterotopic ossification is also significantly increased. Patient will require posterior hip precautions and be touchdown weightbearing for the next 8 weeks with gradual weightbearing thereafter. Because of his multiple comorbidities we remain concerned about potential for resulting complications.DVT prophylaxis will resume with Eliquis tomorrow.  We have recommended prophylactic radiation for heterotopic ossification.  ?  ?  ?  ?Astrid Divine. Marcelino Scot, M.D. ?   ?

## 2021-07-05 ENCOUNTER — Ambulatory Visit
Admit: 2021-07-05 | Discharge: 2021-07-05 | Disposition: A | Discharge status: Home / Self Care | Payer: Medicare HMO | Attending: Radiation Oncology | Admitting: Radiation Oncology

## 2021-07-05 ENCOUNTER — Encounter (HOSPITAL_COMMUNITY): Payer: Self-pay | Admitting: Orthopedic Surgery

## 2021-07-05 ENCOUNTER — Encounter: Payer: Self-pay | Admitting: Radiation Oncology

## 2021-07-05 DIAGNOSIS — I5022 Chronic systolic (congestive) heart failure: Secondary | ICD-10-CM | POA: Diagnosis not present

## 2021-07-05 DIAGNOSIS — I10 Essential (primary) hypertension: Secondary | ICD-10-CM | POA: Diagnosis not present

## 2021-07-05 DIAGNOSIS — S73005A Unspecified dislocation of left hip, initial encounter: Secondary | ICD-10-CM | POA: Diagnosis not present

## 2021-07-05 DIAGNOSIS — S32402A Unspecified fracture of left acetabulum, initial encounter for closed fracture: Secondary | ICD-10-CM

## 2021-07-05 LAB — CBC
HCT: 36.1 % — ABNORMAL LOW (ref 39.0–52.0)
Hemoglobin: 12.4 g/dL — ABNORMAL LOW (ref 13.0–17.0)
MCH: 28.1 pg (ref 26.0–34.0)
MCHC: 34.3 g/dL (ref 30.0–36.0)
MCV: 81.9 fL (ref 80.0–100.0)
Platelets: 208 10*3/uL (ref 150–400)
RBC: 4.41 MIL/uL (ref 4.22–5.81)
RDW: 14.1 % (ref 11.5–15.5)
WBC: 12.9 10*3/uL — ABNORMAL HIGH (ref 4.0–10.5)
nRBC: 0 % (ref 0.0–0.2)

## 2021-07-05 LAB — BASIC METABOLIC PANEL
Anion gap: 9 (ref 5–15)
BUN: 11 mg/dL (ref 6–20)
CO2: 26 mmol/L (ref 22–32)
Calcium: 8.7 mg/dL — ABNORMAL LOW (ref 8.9–10.3)
Chloride: 100 mmol/L (ref 98–111)
Creatinine, Ser: 1.17 mg/dL (ref 0.61–1.24)
GFR, Estimated: >60 mL/min (ref >60)
Glucose, Bld: 131 mg/dL — ABNORMAL HIGH (ref 70–99)
Potassium: 4.1 mmol/L (ref 3.5–5.1)
Sodium: 135 mmol/L (ref 135–145)

## 2021-07-05 MED ORDER — FLUTICASONE PROPIONATE 50 MCG/ACT NA SUSP
2.0000 | Freq: Every day | NASAL | Status: DC | PRN
  Administered 2021-07-05: 2 via NASAL
  Filled 2021-07-05: qty 16

## 2021-07-05 MED ORDER — ALUM & MAG HYDROXIDE-SIMETH 200-200-20 MG/5ML PO SUSP
30.0000 mL | ORAL | Status: DC | PRN
Start: 2021-07-05 — End: 2021-07-08
  Administered 2021-07-05 – 2021-07-06 (×2): 30 mL via ORAL
  Filled 2021-07-05 (×2): qty 30

## 2021-07-05 MED ORDER — APIXABAN 5 MG PO TABS
5.0000 mg | ORAL_TABLET | Freq: Two times a day (BID) | ORAL | Status: DC
  Administered 2021-07-05 – 2021-07-08 (×7): 5 mg via ORAL
  Filled 2021-07-05 (×7): qty 1

## 2021-07-05 NOTE — Progress Notes (Signed)
?Progress Note ? ?Patient: Billy [name] NWG:[id number] DOB: 11/08/1965  ?DOA: 07/01/2021  DOS: 07/05/2021  ?  ?Brief hospital course: ?Billy [name] is a 56 y.o. male with a history of VF cardiac arrest s/p ICD placement 2010, chronic HFrEF, CAD s/p CABGx4 2003, BMS to [place] 2009, PAF s/p ablation 2022 who presented to the ED 3/31 after slipping in wet grass at home, falling with severe immediate left hip pain. XR confirmed dislocated left hip with comminuted acetabular fracture. Orthopedics was consulted, taken to OR for closed reduction of left hip and traction placement. Orthopedics recommended LLE traction through the weekend and performed definitive ORIF 4/3. The patient is undergoing postoperative radiation to prevent heterotopic ossification on 4/4.  ? ?Assessment and Plan: ?* Closed left acetabular fracture ([place]) ?- Weight bearing as per ortho s/p ORIF 4/3. ?- RadOnc consulted, will oversee ppx XRT to reduce risk of heterotopic ossification postoperatively. ?- Continue pain control as ordered. ?- Orthopedics to order testosterone studies, bone scan given high suspicion for osteoporosis. ? ?Dislocated hip, left, initial encounter [facility]) ?S/p closed reduction in OR with sedation 3/31 with subsequent subluxation. Per ortho. ? ?Stage 3a chronic kidney disease (CKD) ([name]) - baseline SCr 1.3-1.6 ?Stable. ?- Avoid nephrotoxins ? ?S/P quadruple vessel bypass ?CAD s/p remote CABGx4 2003, BMS in 2009 since then. He denies current or recent angina.  ?- Continue home BB, imdur, statin, zetia. ?- Restart plavix POD#1 ? ?GERD (gastroesophageal reflux disease) ?- Continue PPI. ? ?Implantable cardioverter-defibrillator (ICD) in situ ?Recent generator exchange. No shocks recently. Interrogated per cardiology. ? ?Essential hypertension ?Continue home meds. ? ?PAF (paroxysmal atrial fibrillation) ([name])-resolved as of 07/01/2021 ?In NSR, s/p SVT ablation 2022. ?- Continue coreg, eliquis (restarted POD #1) ? ?Chronic HFrEF (heart  failure with reduced ejection fraction) ([place]) ?No evidence of current decompensation. BNP wnl.  ?- Continue home medications including coreg, lasix '40mg'$  daily, entresto.  ?- Appreciate cardiology recommendations regarding perioperative monitoring. ?- Consider spironolactone/SGLT2i at follow up ? ?Hypothyroidism ?- Continue synthroid. Last TSH was 4.63  ? ?Obesity: Estimated body mass index is 36.96 kg/m? as calculated from the following: ?  Height as of 06/17/21: '5\' 11"'$  (1.803 m). ?  Weight as of 06/17/21: 120.2 kg. ? ?Subjective: Pain is controlled, eager to work with PT today. No new complaints. Still has oxygen applied this morning, though denies ever having dyspnea. ? ?Objective: ?Vitals:  ? 07/05/21 0355 07/05/21 0610 07/05/21 0735 07/05/21 1124  ?BP:  123/84 121/75   ?Pulse:  61 63   ?Resp:  18 18   ?Temp:  98.7 ?F (37.1 ?C) 98 ?F (36.7 ?C)   ?TempSrc:  Oral Oral   ?SpO2: 94% 99% 98% 99%  ? ?Gen: 56 y.o. male in no distress ?Pulm: Nonlabored breathing supplemental oxygen. Upper airway wheeze that resolves spontaneously. ?CV: Regular rate and rhythm. No murmur, rub, or gallop. No JVD, no significant dependent edema. ?GI: Abdomen soft, non-tender, non-distended, with normoactive bowel sounds.  ?Ext: Warm, no deformities. Distal LE's neurovascularly intact. ?Skin: Operative site not examined this morning, no other rashes, lesions or ulcers on visualized skin. ?Neuro: Alert and oriented. No focal neurological deficits. ?Psych: Judgement and insight appear fair. Mood euthymic & affect congruent. Behavior is appropriate.   ? ?Data Personally reviewed: ? ?CBC: ?Recent Labs  ?Lab 07/01/21 ?2240 07/04/21 ?0233 07/05/21 ?5784  ?WBC 11.5* 9.3 12.9*  ?NEUTROABS 8.1*  --   --   ?HGB 16.0 12.8* 12.4*  ?HCT 47.1 37.5* 36.1*  ?MCV 82.1 82.6 81.9  ?PLT  271 201 208  ? ?Basic Metabolic Panel: ?Recent Labs  ?Lab 07/01/21 ?2240 07/03/21 ?0052 07/04/21 ?0233 07/05/21 ?1749  ?NA 141 136 135 135  ?K 3.8 4.1 3.6 4.1  ?CL 109 102 103 100   ?CO2 '24 29 27 26  '$ ?GLUCOSE 93 104* 101* 131*  ?BUN '13 9 12 11  '$ ?CREATININE 1.41* 1.41* 1.35* 1.17  ?CALCIUM 9.0 8.8* 8.7* 8.7*  ?MG  --   --  1.8  --   ? ?GFR: ?CrCl cannot be calculated (Unknown ideal weight.). ?Liver Function Tests: ?No results for input(s): AST, ALT, ALKPHOS, BILITOT, PROT, ALBUMIN in the last 168 hours. ?No results for input(s): LIPASE, AMYLASE in the last 168 hours. ?No results for input(s): AMMONIA in the last 168 hours. ?Coagulation Profile: ?Recent Labs  ?Lab 07/01/21 ?2240 07/04/21 ?0233  ?INR 1.2 1.2  ? ?Cardiac Enzymes: ?No results for input(s): CKTOTAL, CKMB, CKMBINDEX, TROPONINI in the last 168 hours. ?BNP (last 3 results) ?No results for input(s): PROBNP in the last 8760 hours. ?HbA1C: ?No results for input(s): HGBA1C in the last 72 hours. ?CBG: ?No results for input(s): GLUCAP in the last 168 hours. ?Lipid Profile: ?No results for input(s): CHOL, HDL, LDLCALC, TRIG, CHOLHDL, LDLDIRECT in the last 72 hours. ?Thyroid Function Tests: ?Recent Labs  ?  07/04/21 ?0233  ?TSH 1.936  ? ?Anemia Panel: ?No results for input(s): VITAMINB12, FOLATE, FERRITIN, TIBC, IRON, RETICCTPCT in the last 72 hours. ?Urine analysis: ?   ?Component Value Date/Time  ? [place] 11/29/2020 1018  ? APPEARANCEUR CLEAR 11/29/2020 1018  ? LABSPEC 1.010 11/29/2020 1018  ? PHURINE 6.0 11/29/2020 1018  ? GLUCOSEU NEGATIVE 11/29/2020 1018  ? [place] NEGATIVE 11/29/2020 1018  ? [place] NEGATIVE 11/29/2020 1018  ? BILIRUBINUR negative 06/06/2016 1012  ? [place] NEGATIVE 11/29/2020 1018  ? PROTEINUR negative 06/06/2016 1012  ? PROTEINUR 30 (A) 10/20/2015 1148  ? UROBILINOGEN 0.2 11/29/2020 1018  ? NITRITE NEGATIVE 11/29/2020 1018  ? LEUKOCYTESUR NEGATIVE 11/29/2020 1018  ? ?Recent Results (from the past 240 hour(s))  ?Surgical PCR screen     Status: None  ? Collection Time: 07/03/21  3:41 PM  ? Specimen: Nasal Mucosa; Nasal Swab  ?Result Value Ref Range Status  ? MRSA, PCR NEGATIVE NEGATIVE Final  ?  Staphylococcus aureus NEGATIVE NEGATIVE Final  ?  Comment: (NOTE) ?The Xpert SA Assay (FDA approved for NASAL specimens in patients 66 ?years of age and older), is one component of a comprehensive ?surveillance program. It is not intended to diagnose infection nor to ?guide or monitor treatment. ?Performed at [facility], [place] [address] [place], [place] ?[address] ?  ?   ?DG Pelvis Comp Min 3V ? ?Result Date: 07/04/2021 ?CLINICAL DATA:  Left acetabular ORIF EXAM: JUDET PELVIS - 3+ VIEW COMPARISON:  None. FINDINGS: Three view radiograph pelvis demonstrates interval left acetabular ORIF with a malleable plate and screws demonstrating ORIF of the anterior column and probable posterior wall of the left acetabulum. Multiple fracture fragments arising from the anterior wall of the left acetabulum are again identified, demonstrating mild anterolateral displacement. Left femoral head appears seated within the left acetabulum. Pelvis is otherwise intact. Visualized right hip is unremarkable. Right common iliac artery stenting has been performed. IMPRESSION: Interval ORIF left anterior column and posterior acetabular wall. Multiple residual mildly displaced fracture fragments of the anterior wall. No dislocation. Electronically Signed   By: Fidela Salisbury M.D.   On: 07/04/2021 19:52  ? ?CT 3D RECON AT SCANNER ? ?Result Date:  07/03/2021 ?CLINICAL DATA:  Nonspecific (abnormal) findings on radiological and other examination of musculoskeletal system left acetabular fracture. EXAM: 3-DIMENSIONAL CT IMAGE RENDERING ON ACQUISITION WORKSTATION TECHNIQUE: 3-dimensional CT images were rendered by post-processing of the original CT data on an acquisition workstation. The 3-dimensional CT images were interpreted and findings were reported in the accompanying complete CT report for this study COMPARISON:  07/02/2021 FINDINGS: 3D surface reformatted images of the pelvis with comminuted left acetabular fracture. IMPRESSION: Comminuted  left acetabular fracture. Electronically Signed   By: Davina Poke D.O.   On: 07/03/2021 17:13  ? ?DG C-Arm 1-60 Min-No Report ? ?Result Date: 07/04/2021 ?CLINICAL DATA:  A 56 year old male presents for follow-u

## 2021-07-05 NOTE — Progress Notes (Addendum)
Pt c/o of pain and burning with foley catheter. Pt requesting that foley be removed at this time due to pain. On call provider and charge nurse made aware of pt's request. Foley removed per pt's request.  ?

## 2021-07-05 NOTE — Consult Note (Addendum)
?Radiation Oncology         [phone] ?________________________________ ? ?Name: Billy [name] MRN: [id number]  ?Date of Service: 07/05/21 DOB: February 14, 1966 ? ?YN:WGNFAO, Mortimer Fries, MD    ? ?REFERRING PHYSICIAN: Dr. Marcelino Scot ? ? ?DIAGNOSIS: left acetabular fracture ? ?HISTORY OF PRESENT ILLNESS:Billy [name] is a 56 y.o. male who is evaluated for an initial consultation visit regarding the patient's diagnosis of acetabular fracture.  The patient was admitted after a fall at home in his yard. The patient was found to have suffered an acetabular fracture of the left hip and surgery has been recommended for the patient.  Given the nature of the injury and plan intervention, the patient is felt to be at significant risk for the development of heterotopic ossification. We have therefore been asked to evaluate the patient today for consideration of postoperative radiation treatment for the prevention of heterotopic ossification postoperatively. ? ? ?PREVIOUS RADIATION THERAPY: No ? ? ?PAST MEDICAL HISTORY:  has a past medical history of Aortic atherosclerosis ([place]), Blood in stool, C. difficile colitis, Cardiac arrest - ventricular fibrillation (02/11/2009), Chronic combined systolic and diastolic CHF (congestive heart failure) ([place]), Chronic kidney disease, stage 3a ([place]), Coronary artery disease, Former tobacco use, Hyperlipidemia, Hypertension, Ischemic cardiomyopathy, Ischemic cardiomyopathy, Marijuana abuse, Myocardial infarct ([place]), Obesity, PAD (peripheral artery disease) ([place]), PAF (paroxysmal atrial fibrillation) ([place]), Pathologic fracture of left acetabulum (07/03/2021), PSVT (paroxysmal supraventricular tachycardia) ([place]), Ventricular tachyarrhythmia ([place]), and Ventricular tachycardia ([place]).   ? ? ?PAST SURGICAL HISTORY: ?Past Surgical History:  ?Procedure Laterality Date  ? ABDOMINAL AORTOGRAM W/LOWER EXTREMITY N/A 05/14/2019  ? Procedure: ABDOMINAL AORTOGRAM W/LOWER EXTREMITY;  Surgeon: Wellington Hampshire,  MD;  Location: [facility];  Service: Cardiovascular;  Laterality: N/A;  ? CARDIAC DEFIBRILLATOR PLACEMENT    ? STJ single chamber ICD implanted for secondary prevention  ? CIRCUMCISION    ? CORONARY ARTERY BYPASS GRAFT  06/17/01  ? X 4  ? CORONARY BALLOON ANGIOPLASTY N/A 01/22/2019  ? Procedure: CORONARY BALLOON ANGIOPLASTY;  Surgeon: Leonie Man, MD;  Location: [facility];  Service: Cardiovascular;  Laterality: N/A;  ? HIP CLOSED REDUCTION Left 07/02/2021  ? Procedure: CLOSED REDUCTION HIP with placement of skeletal traction;  Surgeon: Tania Ade, MD;  Location: [facility];  Service: Orthopedics;  Laterality: Left;  ? ICD GENERATOR CHANGEOUT N/A 06/17/2021  ? Procedure: ICD GENERATOR CHANGEOUT;  Surgeon: Deboraha Sprang, MD;  Location: [facility];  Service: Cardiovascular;  Laterality: N/A;  ? LEFT HEART CATH AND CORS/GRAFTS ANGIOGRAPHY N/A 01/22/2019  ? Procedure: LEFT HEART CATH AND CORS/GRAFTS ANGIOGRAPHY;  Surgeon: Leonie Man, MD;  Location: [facility];  Service: Cardiovascular;  Laterality: N/A;  ? LEFT HEART CATH AND CORS/GRAFTS ANGIOGRAPHY N/A 05/18/2020  ? Procedure: LEFT HEART CATH AND CORS/GRAFTS ANGIOGRAPHY;  Surgeon: Troy Sine, MD;  Location: [facility];  Service: Cardiovascular;  Laterality: N/A;  ? PERIPHERAL VASCULAR INTERVENTION Right 05/14/2019  ? Procedure: PERIPHERAL VASCULAR INTERVENTION;  Surgeon: Wellington Hampshire, MD;  Location: [facility];  Service: Cardiovascular;  Laterality: Right;  ? SVT ABLATION N/A 06/21/2020  ? Procedure: SVT ABLATION;  Surgeon: Evans Lance, MD;  Location: [facility];  Service: Cardiovascular;  Laterality: N/A;  ? ? ? ?FAMILY HISTORY: family history includes Heart attack in his father; Heart disease in his sister; Hypertension in his mother and another family member. ? ? ?SOCIAL HISTORY:  reports that he quit smoking about 18 years  ago. His smoking use included cigarettes. He has a 40.00 pack-year  smoking history. He has never been exposed to tobacco smoke. He has never used smokeless tobacco. He reports that he does not drink alcohol and does not use drugs. The patient is married and lives in [place].  ? ? ?ALLERGIES: Patient has no known allergies. ? ? ?MEDICATIONS:  ?Current Facility-Administered Medications  ?Medication Dose Route Frequency Provider Last Rate Last Admin  ? acetaminophen (OFIRMEV) IV 1,000 mg  1,000 mg Intravenous Q6H Ainsley Spinner, PA-C 400 mL/hr at 07/05/21 0430 1,000 mg at 07/05/21 0430  ? acetaminophen (TYLENOL) tablet 650 mg  650 mg Oral Q6H PRN Ainsley Spinner, PA-C   650 mg at 07/03/21 0148  ? Or  ? acetaminophen (TYLENOL) suppository 650 mg  650 mg Rectal Q6H PRN Ainsley Spinner, PA-C      ? ascorbic acid (VITAMIN C) tablet 500 mg  500 mg Oral Daily Ainsley Spinner, PA-C   500 mg at 07/04/21 1437  ? atorvastatin (LIPITOR) tablet 80 mg  80 mg Oral Daily Ainsley Spinner, PA-C   80 mg at 07/04/21 1435  ? calcium citrate (CALCITRATE - dosed in mg elemental calcium) tablet 200 mg of elemental calcium  200 mg of elemental calcium Oral BID Ainsley Spinner, PA-C   200 mg of elemental calcium at 07/04/21 2214  ? carvedilol (COREG) tablet 12.5 mg  12.5 mg Oral BID WC Ainsley Spinner, PA-C   12.5 mg at 07/04/21 1816  ? Chlorhexidine Gluconate Cloth 2 % PADS 6 each  6 each Topical Daily Patrecia Pour, MD      ? cholecalciferol (VITAMIN D3) tablet 2,000 Units  2,000 Units Oral BID Ainsley Spinner, PA-C   2,000 Units at 07/04/21 2214  ? clopidogrel (PLAVIX) tablet 75 mg  75 mg Oral Daily Ainsley Spinner, PA-C      ? docusate sodium (COLACE) capsule 100 mg  100 mg Oral BID Ainsley Spinner, PA-C   100 mg at 07/04/21 2214  ? ezetimibe (ZETIA) tablet 10 mg  10 mg Oral Daily Ainsley Spinner, PA-C   10 mg at 07/04/21 1436  ? furosemide (LASIX) tablet 40 mg  40 mg Oral Daily Ainsley Spinner, PA-C   40 mg at 07/04/21 1437  ? HYDROmorphone (DILAUDID) injection 1-2 mg  1-2 mg Intravenous Q3H PRN Ainsley Spinner, PA-C   2 mg at 07/05/21 0028  ?  isosorbide mononitrate (IMDUR) 24 hr tablet 30 mg  30 mg Oral Daily Ainsley Spinner, PA-C   30 mg at 07/04/21 1437  ? levothyroxine (SYNTHROID) tablet 50 mcg  50 mcg Oral QAC breakfast Ainsley Spinner, PA-C   50 mcg at 07/05/21 0530  ? methocarbamol (ROBAXIN) tablet 1,000 mg  1,000 mg Oral TID Ainsley Spinner, PA-C   1,000 mg at 07/04/21 2214  ? metoCLOPramide (REGLAN) tablet 5-10 mg  5-10 mg Oral Q8H PRN Ainsley Spinner, PA-C      ? Or  ? metoCLOPramide (REGLAN) injection 5-10 mg  5-10 mg Intravenous Q8H PRN Ainsley Spinner, PA-C      ? montelukast (SINGULAIR) tablet 10 mg  10 mg Oral Daily Ainsley Spinner, PA-C   10 mg at 07/04/21 1438  ? mupirocin ointment (BACTROBAN) 2 % 1 application.  1 application. Nasal BID Ainsley Spinner, PA-C   1 application. at 07/03/21 2135  ? ondansetron (ZOFRAN) tablet 4 mg  4 mg Oral Q6H PRN Ainsley Spinner, PA-C      ? Or  ? ondansetron (ZOFRAN) injection 4 mg  4  mg Intravenous Q6H PRN Ainsley Spinner, PA-C   4 mg at 07/03/21 3546  ? oxyCODONE (Oxy IR/ROXICODONE) immediate release tablet 5-10 mg  5-10 mg Oral Q4H PRN Chadwell, Joshua, PA-C   10 mg at 07/05/21 0308  ? pantoprazole (PROTONIX) EC tablet 40 mg  40 mg Oral Daily Ainsley Spinner, PA-C   40 mg at 07/04/21 1438  ? potassium chloride SA (KLOR-CON M) CR tablet 20 mEq  20 mEq Oral Daily Ainsley Spinner, PA-C   20 mEq at 07/04/21 1436  ? sacubitril-valsartan (ENTRESTO) 24-26 mg per tablet  1 tablet Oral BID Ainsley Spinner, PA-C   1 tablet at 07/04/21 2214  ? Vitamin D (Ergocalciferol) (DRISDOL) capsule 50,000 Units  50,000 Units Oral Q7 days Ainsley Spinner, PA-C   50,000 Units at 07/04/21 1600  ? zinc sulfate capsule 220 mg  220 mg Oral Daily Ainsley Spinner, PA-C   220 mg at 07/04/21 1438  ? ? ? ?REVIEW OF SYSTEMS:  On review of systems, the patient reports that he  is doing well overall. He has been working with his nurse overnight to achieve pain relief and balancing alertness. He did have some trouble with discomfort of his foley catheter and this was removed overnight. His  wife is at the bedside and they are aware of the role of radiation. They do not have any plans for future fertility. No complaints are otherwise verbalized.    ? ?  ? ?PHYSICAL EXAM:  oral temperature is 98.7 ?F

## 2021-07-05 NOTE — Progress Notes (Signed)
? ?Progress Note ? ?Patient Name: Billy [name] ?Date of Encounter: 07/05/2021 ? ?Primary Cardiologist:   Kirk Ruths, MD ? ? ?Subjective  ? ?No chest pain.  Mildly upper airway wheezing.  ? ?Inpatient Medications  ?  ?Scheduled Meds: ? apixaban  5 mg Oral BID  ? vitamin C  500 mg Oral Daily  ? atorvastatin  80 mg Oral Daily  ? calcium citrate  200 mg of elemental calcium Oral BID  ? carvedilol  12.5 mg Oral BID WC  ? Chlorhexidine Gluconate Cloth  6 each Topical Daily  ? cholecalciferol  2,000 Units Oral BID  ? clopidogrel  75 mg Oral Daily  ? docusate sodium  100 mg Oral BID  ? ezetimibe  10 mg Oral Daily  ? furosemide  40 mg Oral Daily  ? isosorbide mononitrate  30 mg Oral Daily  ? levothyroxine  50 mcg Oral QAC breakfast  ? methocarbamol  1,000 mg Oral TID  ? montelukast  10 mg Oral Daily  ? mupirocin ointment  1 application. Nasal BID  ? pantoprazole  40 mg Oral Daily  ? potassium chloride SA  20 mEq Oral Daily  ? sacubitril-valsartan  1 tablet Oral BID  ? Vitamin D (Ergocalciferol)  50,000 Units Oral Q7 days  ? zinc sulfate  220 mg Oral Daily  ? ?Continuous Infusions: ? ? ?PRN Meds: ?acetaminophen **OR** acetaminophen, alum & mag hydroxide-simeth, HYDROmorphone (DILAUDID) injection, metoCLOPramide **OR** metoCLOPramide (REGLAN) injection, ondansetron **OR** ondansetron (ZOFRAN) IV, oxyCODONE  ? ?Vital Signs  ?  ?Vitals:  ? 07/04/21 2130 07/05/21 0355 07/05/21 0610 07/05/21 0735  ?BP: (!) 142/76  123/84 121/75  ?Pulse: 64  61 63  ?Resp: '18  18 18  '$ ?Temp: 97.7 ?F (36.5 ?C)  98.7 ?F (37.1 ?C) 98 ?F (36.7 ?C)  ?TempSrc: Oral  Oral Oral  ?SpO2: 100% 94% 99% 98%  ? ? ?Intake/Output Summary (Last 24 hours) at 07/05/2021 1112 ?Last data filed at 07/05/2021 1041 ?Gross per 24 hour  ?Intake 3020 ml  ?Output 3715 ml  ?Net -695 ml  ? ?There were no vitals filed for this visit. ? ?Telemetry  ?  ?NSR - Personally Reviewed ? ?ECG  ?  ?NA - Personally Reviewed ? ?Physical Exam  ? ?GEN: No  acute distress.   ?Neck: No   JVD ?Cardiac: RRR, no murmurs, rubs, or gallops.  ?Respiratory: Clear   to auscultation bilaterally. ?GI: Soft, nontender, non-distended, normal bowel sounds  ?MS:  No edema; No deformity. ?Neuro:   Nonfocal  ?Psych: Oriented and appropriate  ? ? ?Labs  ?  ?Chemistry ?Recent Labs  ?Lab 07/03/21 ?9562 07/04/21 ?0233 07/05/21 ?1308  ?NA 136 135 135  ?K 4.1 3.6 4.1  ?CL 102 103 100  ?CO2 '29 27 26  '$ ?GLUCOSE 104* 101* 131*  ?BUN '9 12 11  '$ ?CREATININE 1.41* 1.35* 1.17  ?CALCIUM 8.8* 8.7* 8.7*  ?GFRNONAA 59* >60 >60  ?ANIONGAP '5 5 9  '$ ?  ? ?Hematology ?Recent Labs  ?Lab 07/01/21 ?2240 07/04/21 ?0233 07/05/21 ?6578  ?WBC 11.5* 9.3 12.9*  ?RBC 5.74 4.54 4.41  ?HGB 16.0 12.8* 12.4*  ?HCT 47.1 37.5* 36.1*  ?MCV 82.1 82.6 81.9  ?MCH 27.9 28.2 28.1  ?MCHC 34.0 34.1 34.3  ?RDW 14.8 14.5 14.1  ?PLT 271 201 208  ? ? ?Cardiac EnzymesNo results for input(s): TROPONINI in the last 168 hours. No results for input(s): TROPIPOC in the last 168 hours.  ? ?BNP ?Recent Labs  ?Lab 07/03/21 ?4696  ?BNP 50.3  ?  ? ?  DDimer No results for input(s): DDIMER in the last 168 hours.  ? ?Radiology  ?  ?DG Pelvis Comp Min 3V ? ?Result Date: 07/04/2021 ?CLINICAL DATA:  Left acetabular ORIF EXAM: JUDET PELVIS - 3+ VIEW COMPARISON:  None. FINDINGS: Three view radiograph pelvis demonstrates interval left acetabular ORIF with a malleable plate and screws demonstrating ORIF of the anterior column and probable posterior wall of the left acetabulum. Multiple fracture fragments arising from the anterior wall of the left acetabulum are again identified, demonstrating mild anterolateral displacement. Left femoral head appears seated within the left acetabulum. Pelvis is otherwise intact. Visualized right hip is unremarkable. Right common iliac artery stenting has been performed. IMPRESSION: Interval ORIF left anterior column and posterior acetabular wall. Multiple residual mildly displaced fracture fragments of the anterior wall. No dislocation. Electronically Signed    By: Fidela Salisbury M.D.   On: 07/04/2021 19:52  ? ?CT 3D RECON AT SCANNER ? ?Result Date: 07/03/2021 ?CLINICAL DATA:  Nonspecific (abnormal) findings on radiological and other examination of musculoskeletal system left acetabular fracture. EXAM: 3-DIMENSIONAL CT IMAGE RENDERING ON ACQUISITION WORKSTATION TECHNIQUE: 3-dimensional CT images were rendered by post-processing of the original CT data on an acquisition workstation. The 3-dimensional CT images were interpreted and findings were reported in the accompanying complete CT report for this study COMPARISON:  07/02/2021 FINDINGS: 3D surface reformatted images of the pelvis with comminuted left acetabular fracture. IMPRESSION: Comminuted left acetabular fracture. Electronically Signed   By: Davina Poke D.O.   On: 07/03/2021 17:13  ? ?DG C-Arm 1-60 Min-No Report ? ?Result Date: 07/04/2021 ?CLINICAL DATA:  A 56 year old male presents for follow-up of ORIF of the LEFT acetabulum. EXAM: DG HIP (WITH OR WITHOUT PELVIS) 2-3V LEFT; DG C-ARM 1-60 MIN-NO REPORT COMPARISON:  July 02, 2021. FINDINGS: Comminuted LEFT acetabular fracture undergoing ORIF on intraoperative fluoroscopic views, total of 7 images are provided. Initial image shows reapproximation of fracture fragments. The femoral head projects over the acetabulum in more anatomic position no longer appearing subluxed on single view also similarly in better position on the lateral projection that is provided. Subsequent images show interval plate and screw fixation of the acetabular fracture. No immediate complication is seen on limited fluoroscopic intraoperative assessment. Fluoroscopic dose: 16.64 mGy Fluoroscopic time: 23 seconds IMPRESSION: Intraoperative fluoroscopic views undergoing ORIF LEFT acetabular fracture. No unexpected findings on limited intraoperative fluoroscopic assessment. Electronically Signed   By: Zetta Bills M.D.   On: 07/04/2021 11:58  ? ?DG C-Arm 1-60 Min-No Report ? ?Result Date:  07/04/2021 ?Fluoroscopy was utilized by the requesting physician.  No radiographic interpretation.  ? ?DG C-Arm 1-60 Min-No Report ? ?Result Date: 07/04/2021 ?Fluoroscopy was utilized by the requesting physician.  No radiographic interpretation.  ? ?DG HIP UNILAT WITH PELVIS 2-3 VIEWS LEFT ? ?Result Date: 07/04/2021 ?CLINICAL DATA:  A 56 year old male presents for follow-up of ORIF of the LEFT acetabulum. EXAM: DG HIP (WITH OR WITHOUT PELVIS) 2-3V LEFT; DG C-ARM 1-60 MIN-NO REPORT COMPARISON:  July 02, 2021. FINDINGS: Comminuted LEFT acetabular fracture undergoing ORIF on intraoperative fluoroscopic views, total of 7 images are provided. Initial image shows reapproximation of fracture fragments. The femoral head projects over the acetabulum in more anatomic position no longer appearing subluxed on single view also similarly in better position on the lateral projection that is provided. Subsequent images show interval plate and screw fixation of the acetabular fracture. No immediate complication is seen on limited fluoroscopic intraoperative assessment. Fluoroscopic dose: 16.64 mGy Fluoroscopic time: 23 seconds IMPRESSION: Intraoperative fluoroscopic  views undergoing ORIF LEFT acetabular fracture. No unexpected findings on limited intraoperative fluoroscopic assessment. Electronically Signed   By: Zetta Bills M.D.   On: 07/04/2021 11:58   ? ?Cardiac Studies  ? ?NA ? ?Patient Profile  ?   ?56 y.o. male with a hx of CAD (PCI to Cx age 65, CABGx4 in 2003, BMS to [place] in 12/2007, PTCA to Cx in 01/2019), VF arrest 02/2009 s/p ICD ([facility]bbott gen change 06/2021), paroxysmal VT, ischemic cardiomyopathy, chronic combined heart failure, PAF diagnosed in 2020, PSVT s/p ablation 06/2020, HTN, HLD, marijuana abuse, former tobacco abuse, PAD s/p prior stent to REIA, CKD stage 3a by labs, prior C diff infection who is being seen 07/03/2021 for the evaluation of preoperative evaluation at the request of Dr. Bonner Puna. ? ?Assessment & Plan  ?  ?HIP  FRACTURE:   OK to get 8Gy of single dose radiation to the left acetabulum to prevent heterotopic ossification.   No contraindication with device ? ?CHRONIC COMBINED SYSTOLIC HF:  Seems be euvolemic.  No change i

## 2021-07-05 NOTE — Evaluation (Signed)
Occupational Therapy Evaluation ?Patient Details ?Name: Billy [name] ?MRN: [id number] ?DOB: 08/11/1965 ?Today's Date: 07/05/2021 ? ? ?History of Present Illness Pt is a 56 y.o. male admitted 07/01/21 after fall at home sustaining L hip acetabular fx dislocation. S/p closed reduction L hip fx dislocation with skeletal traction placement 4/1. S/p L acetabular ORIF 4/3. Oncology consulted for postoperative radiation tx for prevention of heterotopic ossification postoperatively. PMH includes HF, CAD, PAF, ICD placement, PAD, CKD, obesity.  ? ?Clinical Impression ?  ?Pt admitted with above and presents to OT with impairments listed below impacting his ability to complete ADLs and IADLs at [facility].  Pt Independent prior to fall, driving occasionally, living with wife who is home and can provide 24/7 supervision/assistance.  Pt completed bed mobility with min guard and cues for adherence to hip precautions.  Completed sit> stand from elevated EOB with min assist and use of RW to maintain TDWB during sit > stand.  Pt ambulated ~15' with RW with min assist and chair follow.  Pt able to engage in grooming tasks in standing with occasional question cues/reminders for TDWB.  Pt reports having recommended BSC and shower chair at home from his wife that he can use.  Pt would benefit from continued acute OT services to maximize independence with self-care tasks and functional mobility prior to d/c with [facility] services.  ?   ? ?Recommendations for follow up therapy are one component of a multi-disciplinary discharge planning process, led by the attending physician.  Recommendations may be updated based on patient status, additional functional criteria and insurance authorization.  ? ?Follow Up Recommendations ? Home health OT  ?  ?Assistance Recommended at Discharge Set up Supervision/Assistance  ?Patient can return home with the following A little help with walking and/or transfers;A little help with bathing/dressing/bathroom;Assistance  with cooking/housework;Help with stairs or ramp for entrance;Assist for transportation ? ?  ?Functional Status Assessment ? Patient has had a recent decline in their functional status and demonstrates the ability to make significant improvements in function in a reasonable and predictable amount of time.  ?Equipment Recommendations ? None recommended by OT (reports has all equip from wife)  ?  ?   ?Precautions / Restrictions Precautions ?Precautions: Posterior Hip;Fall ?Precaution Booklet Issued: Yes (comment) (reviewed and provided handout) ?Precaution Comments: TDWB ?Restrictions ?Weight Bearing Restrictions: Yes ?LLE Weight Bearing: Touchdown weight bearing  ? ?  ? ?Mobility Bed Mobility ?Overal bed mobility: Needs Assistance ?Bed Mobility: Supine to Sit ?  ?  ?Supine to sit: Min assist, Min guard ?  ?  ?General bed mobility comments: Cues for technique due to posterior hip precautions. ?  ? ?Transfers ?Overall transfer level: Needs assistance ?Equipment used: Rolling walker (2 wheels) ?Transfers: Sit to/from Stand ?Sit to Stand: Min assist, +2 safety/equipment ?  ?  ?  ?  ?  ?General transfer comment: min cues/reminder for TDWB during ambulation.  chair follow during ambulation ~15 feet ?  ? ?  ?Balance Overall balance assessment: Needs assistance ?Sitting-balance support: No upper extremity supported ?Sitting balance-Leahy Scale: Good ?  ?  ?Standing balance support: Single extremity supported, No upper extremity supported, During functional activity ?Standing balance-Leahy Scale: Good ?Standing balance comment: intermittent min guard and cues for TDWB during standing during functional activity ?  ?  ?  ?  ?  ?  ?  ?  ?  ?  ?  ?   ? ?ADL either performed or assessed with clinical judgement  ? ?ADL Overall ADL's : Needs assistance/impaired ?  Eating/Feeding: Independent ?  ?Grooming: Min guard;Supervision/safety;Wash/dry hands;Wash/dry face;Oral care ?Grooming Details (indicate cue type and reason): intermittent  min guard to supervision while standing at sink to wash face and completed oral care.  Therapist providing min question cues for adherence to TDWB while standing. ?Upper Body Bathing: Set up ?  ?Lower Body Bathing: Minimal assistance ?  ?  ?  ?  ?  ?Toilet Transfer: Minimal assistance;Adhering to hip precautions;Cueing for safety;Ambulation;Rolling walker (2 wheels) ?Toilet Transfer Details (indicate cue type and reason): Min assist for sit > stand and min guard to min assist during ambulation.  Pt sitting quickly upon descent due to fatigue vs instability ?  ?  ?  ?  ?Functional mobility during ADLs: Minimal assistance;Rolling walker (2 wheels) ?General ADL Comments: min question cues/reminder cues for TDWB during mobility and standing  ? ? ? ?Vision Baseline Vision/History: 1 Wears glasses ?Ability to See in Adequate Light: 0 Adequate ?Patient Visual Report: No change from baseline ?Vision Assessment?: No apparent visual deficits  ?   ?   ?   ? ?Pertinent Vitals/Pain Pain Assessment ?Pain Assessment: No/denies pain  ? ? ? ?Hand Dominance Right ?  ?Extremity/Trunk Assessment Upper Extremity Assessment ?Upper Extremity Assessment: Overall WFL for tasks assessed ?  ?Lower Extremity Assessment ?Lower Extremity Assessment: LLE deficits/detail ?LLE Deficits / Details: s/p L acetabular ORIF; knee flex/ext and ankle at least ?  ?  ?  ?Communication Communication ?Communication: No difficulties ?  ?Cognition Arousal/Alertness: Awake/alert ?Behavior During Therapy: [facility] for tasks assessed/performed ?Overall Cognitive Status: Within Functional Limits for tasks assessed ?  ?  ?  ?  ?  ?  ?  ?  ?  ?  ?  ?  ?  ?  ?  ?  ?  ?  ?  ?   ?   ?   ? ? ?Home Living Family/patient expects to be discharged to:: Private residence ?Living Arrangements: Spouse/significant other ?Available Help at Discharge: Available 24 hours/day ?Type of Home: Mobile home ?Home Access: Stairs to enter ?Entrance Stairs-Number of Steps: 3 steps to  enter ?Entrance Stairs-Rails: Can reach both ?Home Layout: One level ?  ?  ?Bathroom Shower/Tub: Walk-in shower ?  ?  ?  ?  ?Home Equipment: Shower seat;BSC/3in1 ?  ?Additional Comments: equipment from wife ?  ? ?  ?Prior Functioning/Environment Prior Level of Function : Independent/Modified Independent;Driving ?  ?  ?  ?  ?  ?  ?Mobility Comments: ambulating without AD, driving occasionally ?ADLs Comments: independent with all bathing/dressing and transfers ?  ? ?  ?  ?OT Problem List: Decreased strength;Decreased range of motion;Decreased activity tolerance;Impaired balance (sitting and/or standing);Decreased knowledge of use of DME or AE;Decreased knowledge of precautions ?  ?   ?OT Treatment/Interventions: Self-care/ADL training;Energy conservation;DME and/or AE instruction;Therapeutic activities;Patient/family education;Balance training  ?  ?OT Goals(Current goals can be found in the care plan section) Acute Rehab OT Goals ?Patient Stated Goal: to go home ?OT Goal Formulation: With patient ?Time For Goal Achievement: 07/19/21 ?Potential to Achieve Goals: Good  ?OT Frequency: Min 2X/week ?  ? ?Co-evaluation PT/OT/SLP Co-Evaluation/Treatment: Yes ?Reason for Co-Treatment: For patient/therapist safety;To address functional/ADL transfers ?  ?OT goals addressed during session: ADL's and self-care ?  ? ?  ?AM-PAC OT "6 Clicks" Daily Activity     ?Outcome Measure Help from another person eating meals?: None ?Help from another person taking care of personal grooming?: A Little ?Help from another person toileting, which includes using toliet, bedpan, or  urinal?: A Little ?Help from another person bathing (including washing, rinsing, drying)?: A Little ?Help from another person to put on and taking off regular upper body clothing?: A Little ?Help from another person to put on and taking off regular lower body clothing?: A Little ?6 Click Score: 19 ?  ?End of Session Equipment Utilized During Treatment: Gait belt;Rolling  walker (2 wheels) ?Nurse Communication: Mobility status ? ?Activity Tolerance: Patient tolerated treatment well;No increased pain ?Patient left: in chair;with call bell/phone within reach;with chair alarm set;with

## 2021-07-05 NOTE — Progress Notes (Signed)
? ?                              Orthopaedic Trauma Service Progress Note ? ?Patient ID: ?Billy [name] ?MRN: [id number] ?DOB/AGE: 08-Feb-1966 56 y.o. ? ?Subjective: ? ?Doing very well this morning ?Minimal pain in his left hip ?XRT scheduled for later this afternoon ? ?ROS ?As above ? ?Objective:  ? ?VITALS:   ?Vitals:  ? 07/05/21 0355 07/05/21 0610 07/05/21 0735 07/05/21 1124  ?BP:  123/84 121/75   ?Pulse:  61 63   ?Resp:  18 18   ?Temp:  98.7 ?F (37.1 ?C) 98 ?F (36.7 ?C)   ?TempSrc:  Oral Oral   ?SpO2: 94% 99% 98% 99%  ? ? ?Estimated body mass index is 36.96 kg/m? as calculated from the following: ?  Height as of 06/17/21: '5\' 11"'$  (1.803 m). ?  Weight as of 06/17/21: 120.2 kg. ? ? ?Intake/Output   ?   04/03 0701 ?04/04 0700 04/04 0701 ?04/05 0700  ? P.O. 240 480  ? I.V. 2100   ? IV Piggyback 300   ? Total Intake 2640 480  ? Urine 3040 950  ? Blood 300   ? Total Output 3340 950  ? Net -700 -470  ?     ?  ? ?LABS ? ?Results for orders placed or performed during the hospital encounter of 07/01/21 (from the past 24 hour(s))  ?Basic metabolic panel     Status: Abnormal  ? Collection Time: 07/05/21 12:52 AM  ?Result Value Ref Range  ? Sodium 135 135 - 145 mmol/L  ? Potassium 4.1 3.5 - 5.1 mmol/L  ? Chloride 100 98 - 111 mmol/L  ? CO2 26 22 - 32 mmol/L  ? Glucose, Bld 131 (H) 70 - 99 mg/dL  ? BUN 11 6 - 20 mg/dL  ? Creatinine, Ser 1.17 0.61 - 1.24 mg/dL  ? Calcium 8.7 (L) 8.9 - 10.3 mg/dL  ? GFR, Estimated >60 >60 mL/min  ? Anion gap 9 5 - 15  ?CBC     Status: Abnormal  ? Collection Time: 07/05/21 12:52 AM  ?Result Value Ref Range  ? WBC 12.9 (H) 4.0 - 10.5 K/uL  ? RBC 4.41 4.22 - 5.81 MIL/uL  ? Hemoglobin 12.4 (L) 13.0 - 17.0 g/dL  ? HCT 36.1 (L) 39.0 - 52.0 %  ? MCV 81.9 80.0 - 100.0 fL  ? MCH 28.1 26.0 - 34.0 pg  ? MCHC 34.3 30.0 - 36.0 g/dL  ? RDW 14.1 11.5 - 15.5 %  ? Platelets 208 150 - 400 K/uL  ? nRBC 0.0 0.0 - 0.2 %  ? ? ? ?PHYSICAL EXAM:  ? ?Gen: awake and alert, resting  comfortably in bed, NAD, wife at bedside  ?Lungs: unlabored ?Ext:  ?     Left Lower extremity  ? Length and rotation of left leg appear symmetric to contralateral side.  Resting position appears appropriate ? Dressing left hip is clean, dry and intact ? Extremity is warm ? + DP pulse ? DPN, SPN, TN sensory function intact  ? EHL, FHL, lesser toe motor functions intact.  Ankle flexion, extension, inversion and eversion intact.  Ankle extension and EHL are 5 out of 5 with manual muscle testing ? No DCT ? Compartments of the lower leg are soft ? ? ?Assessment/Plan: ?1 Day Post-Op  ? ? ? ?Anti-infectives (From admission, onward)  ? ? Start     Dose/Rate  Route Frequency Ordered Stop  ? 07/04/21 1800  ceFAZolin (ANCEF) IVPB 2g/100 mL premix       ? 2 g ?200 mL/hr over 30 Minutes Intravenous Every 6 hours 07/04/21 1332 07/05/21 0754  ? 07/04/21 1147  vancomycin (VANCOCIN) powder  Status:  Discontinued       ?   As needed 07/04/21 1150 07/04/21 1233  ? 07/04/21 0800  ceFAZolin (ANCEF) IVPB 3g/100 mL premix       ? 3 g ?200 mL/hr over 30 Minutes Intravenous On call to O.R. 07/03/21 1548 07/04/21 0952  ? ?  ?. ? ?POD/HD#: 105 ? ?56 year old male with complex cardiac history a ground-level fall with comminuted left acetabulum fracture ? ?-Comminuted left acetabulum fracture s/p ORIF ? Touchdown weightbearing left leg with assistance ? Posterior hip precautions left hip ? PT and OT evaluations ? Ice as needed ? Dressing changes as needed starting tomorrow ?  ? XRT today for heterotopic ossification prophylaxis (radiation oncology at [facility]) ? ?- Pain management: ? Multimodal ? Burtis Junes orals over IV ? ?- ABL anemia/Hemodynamics ? Monitor  ? Had very minimal blood loss yesterday with respect to the procedure that was done.  Did not encounter any unexpected bleeding ? ?- Medical issues  ? Per hospitalist and cardiology ? ?- DVT/PE prophylaxis: ? Okay to resume Eliquis and Plavix.  I had already placed those orders  postoperatively.  He should be receiving both medications today ? ?- ID:  ? Perioperative antibiotics ? ?- Metabolic Bone Disease: ? Vitamin D deficiency ?  Supplement ? Very unusual fracture for someone at his age given mechanism. ? Strongly would recommend bone density scan as well as testosterone panel.  We will order initial testosterone panel while he is here.  If abnormal would definitely recommend repeating in 8 to 12 weeks and then obtaining bone density scan as well.  I do suspect that he has osteoporosis. ? Outpatient referral to osteoporosis clinic ? ?- Activity: ? As above ? ?- Impediments to fracture healing: ? Poor bone quality ? Vitamin D deficiency ?- Dispo: ? Therapy evals ? XRT for atrial prophylaxis today ? Ortho issues addressed ? ? Follow-up with orthopedics in 10 to 14 days.  Sutures out at that time ? ? ? ?Jari Pigg, PA-C ?[phone] (C) ?07/05/2021, 11:32 AM ? ?[facility] ?[place] [address] ?[phone] Jenetta Downer) ?[phone] (F) ? ? ? ?After 5pm and on the weekends please log on to Amion, go to orthopaedics and the look under the [facility] Call for the provider(s) on call. You can also call our office at [phone] and then follow the prompts to be connected to the call team.  ? Patient ID: [name], male   DOB: [birth year], 56 y.o.   MRN: [id number] ? ?

## 2021-07-05 NOTE — Evaluation (Addendum)
Physical Therapy Evaluation ?Patient Details ?Name: Billy [name] ?MRN: [id number] ?DOB: Dec 19, 1965 ?Today's Date: 07/05/2021 ? ?History of Present Illness ? Pt is a 56 y.o. male admitted 07/01/21 after fall at home sustaining L hip acetabular fx dislocation. S/p closed reduction L hip fx dislocation with skeletal traction placement 4/1. S/p L acetabular ORIF 4/3. Oncology consulted for postoperative radiation tx for prevention of heterotopic ossification postoperatively. PMH includes HF, CAD, PAF, ICD placement, PAD, CKD, obesity. ?  ?Clinical Impression ? Pt presents with an overall decrease in functional mobility secondary to above. PTA, pt independent, drives sometimes, lives with wife who assists with household tasks as needed. Initiated educ re: precautions, positioning, therex, and importance of mobility. Today, pt able to initiate transfer and gait training with RW and intermittent minA. Pt would benefit from continued acute PT services to maximize functional mobility and independence prior to d/c with HHPT services. ? ?SpO2 99% on RA     ? ?Recommendations for follow up therapy are one component of a multi-disciplinary discharge planning process, led by the attending physician.  Recommendations may be updated based on patient status, additional functional criteria and insurance authorization. ? ?Follow Up Recommendations Home health PT ? ?  ?Assistance Recommended at Discharge Intermittent Supervision/Assistance  ?Patient can return home with the following ? A little help with walking and/or transfers;A little help with bathing/dressing/bathroom;Assistance with cooking/housework;Assist for transportation;Help with stairs or ramp for entrance ? ?  ?Equipment Recommendations Rolling walker (2 wheels) - TBD if bariatric-size needed  ?Recommendations for Other Services ?    ?  ?Functional Status Assessment Patient has had a recent decline in their functional status and demonstrates the ability to make significant  improvements in function in a reasonable and predictable amount of time.  ? ?  ?Precautions / Restrictions Precautions ?Precautions: Posterior Hip;Fall ?Precaution Booklet Issued: Yes (comment) (reviewed and provided handout) ?Precaution Comments: TDWB ?Restrictions ?Weight Bearing Restrictions: Yes ?LLE Weight Bearing: Touchdown weight bearing  ? ?  ? ?Mobility ? Bed Mobility ?  ?Bed Mobility: Supine to Sit ?  ?  ?Supine to sit: Min assist, HOB elevated ?  ?  ?General bed mobility comments: MinA for LLE management, verbal cues for sequencing to maintain LLE hip precautions ?  ? ?Transfers ?Overall transfer level: Needs assistance ?Equipment used: Rolling walker (2 wheels) ?Transfers: Sit to/from Stand ?Sit to Stand: Min assist, +2 safety/equipment, From elevated surface ?  ?  ?  ?  ?  ?General transfer comment: Min guard for trunk elevation standing from EOB to RW, min cues for sequencing for hand placement and to maintain posterior hip precautions; poor eccentric control to sit in recliner ?  ? ?Ambulation/Gait ?Ambulation/Gait assistance: Min guard, Min assist, +2 safety/equipment ?Gait Distance (Feet): 14 Feet ?Assistive device: Rolling walker (2 wheels) ?  ?Gait velocity: Decreased ?  ?  ?General Gait Details: Pt able to take complete hops on RLE while maintaining LLE TDWB precautions, RW and intermittent minA for RW management and verbal cues ? ?Stairs ?  ?  ?  ?  ?  ? ?Wheelchair Mobility ?  ? ?Modified Rankin (Stroke Patients Only) ?  ? ?  ? ?Balance Overall balance assessment: Needs assistance ?Sitting-balance support: No upper extremity supported ?Sitting balance-Leahy Scale: Good ?  ?  ?Standing balance support: Single extremity supported, No upper extremity supported, During functional activity ?Standing balance-Leahy Scale: Fair ?Standing balance comment: able to static stand at sink to brush teeth and wash hands without UE support, min guard; intermittent  cues for TDWB precautions ?  ?  ?  ?  ?  ?  ?   ?  ?  ?  ?  ?   ? ? ? ?Pertinent Vitals/Pain Pain Assessment ?Pain Assessment: No/denies pain  ? ? ?Home Living Family/patient expects to be discharged to:: Private residence ?Living Arrangements: Spouse/significant other ?Available Help at Discharge: Family;Available 24 hours/day ?Type of Home: Mobile home ?Home Access: Stairs to enter ?Entrance Stairs-Rails: Right;Left;Can reach both ?Entrance Stairs-Number of Steps: 3 ?  ?Home Layout: One level ?Home Equipment: Shower seat;BSC/3in1 ?Additional Comments: equipment from wife  ?  ?Prior Function Prior Level of Function : Independent/Modified Independent;Driving ?  ?  ?  ?  ?  ?  ?Mobility Comments: ambulating without AD, driving occasionally, does not work ?ADLs Comments: independent with all bathing/dressing and transfers ?  ? ? ?Hand Dominance  ? Dominant Hand: Right ? ?  ?Extremity/Trunk Assessment  ? Upper Extremity Assessment ?Upper Extremity Assessment: Overall WFL for tasks assessed ?  ? ?Lower Extremity Assessment ?Lower Extremity Assessment: LLE deficits/detail ?LLE Deficits / Details: s/p L acetabular ORIF with expected post-op hip weakness; knee flex/ext and ankle at least 3/5 strength ?LLE Coordination: decreased gross motor ?  ? ?   ?Communication  ? Communication: No difficulties  ?Cognition Arousal/Alertness: Awake/alert ?Behavior During Therapy: [facility] for tasks assessed/performed ?Overall Cognitive Status: Within Functional Limits for tasks assessed ?  ?  ?  ?  ?  ?  ?  ?  ?  ?  ?  ?  ?  ?  ?  ?  ?  ?  ?  ? ?  ?General Comments General comments (skin integrity, edema, etc.): initiated post-op educ re: posterior hip & TDWB precautions, edema control, DVT prevention, therex, activity recommendations. discussed d/c planning - pt feels comfortable returning home. Will trial bariatric-sized RW next session ? ?  ?Exercises General Exercises - Lower Extremity ?Ankle Circles/Pumps: AROM, Both, Seated ?Quad Sets: AROM, Left, Seated  ? ?Assessment/Plan  ?  ?PT  Assessment Patient needs continued PT services  ?PT Problem List Decreased strength;Decreased range of motion;Decreased activity tolerance;Decreased balance;Decreased mobility;Decreased knowledge of use of DME;Decreased knowledge of precautions;Pain ? ?   ?  ?PT Treatment Interventions DME instruction;Gait training;Stair training;Functional mobility training;Therapeutic activities;Therapeutic exercise;Balance training;Patient/family education   ? ?PT Goals (Current goals can be found in the Care Plan section)  ?Acute Rehab PT Goals ?Patient Stated Goal: return home ?PT Goal Formulation: With patient ?Time For Goal Achievement: 07/19/21 ?Potential to Achieve Goals: Good ? ?  ?Frequency Min 5X/week ?  ? ? ?Co-evaluation   ?Reason for Co-Treatment: For patient/therapist safety;To address functional/ADL transfers ?  ?OT goals addressed during session: ADL's and self-care ?  ? ? ?  ?AM-PAC PT "6 Clicks" Mobility  ?Outcome Measure Help needed turning from your back to your side while in a flat bed without using bedrails?: A Little ?Help needed moving from lying on your back to sitting on the side of a flat bed without using bedrails?: A Little ?Help needed moving to and from a bed to a chair (including a wheelchair)?: A Little ?Help needed standing up from a chair using your arms (e.g., wheelchair or bedside chair)?: A Little ?Help needed to walk in hospital room?: A Little ?Help needed climbing 3-5 steps with a railing? : A Lot ?6 Click Score: 17 ? ?  ?End of Session Equipment Utilized During Treatment: Gait belt ?Activity Tolerance: Patient tolerated treatment well ?Patient left: in chair;with call  bell/phone within reach;with chair alarm set;with nursing/sitter in room ?Nurse Communication: Mobility status ?PT Visit Diagnosis: Other abnormalities of gait and mobility (R26.89) ?  ? ?Time: 1898-4210 ?PT Time Calculation (min) (ACUTE ONLY): 27 min ? ? ?Charges:   PT Evaluation ?$PT Eval Moderate Complexity: 1 Mod ?  ?   ?Mabeline Caras, PT, DPT ?Acute Rehabilitation Services  ?Pager [phone] ?Office [phone] ? ?Derry Lory ?07/05/2021, 12:41 PM ? ?

## 2021-07-05 NOTE — TOC Progression Note (Signed)
Transition of Care (TOC) - Progression Note  ? ? ?Patient Details  ?Name: Billy [name] ?MRN: [id number] ?Date of Birth: 1966-03-23 ? ?Transition of Care (TOC) CM/SW Contact  ?Marilu Favre, RN ?Phone Number: ?07/05/2021, 10:40 AM ? ?Clinical Narrative:    ? ? ?Consult for [facility], DME and SNF. Await PT/OT evaluations. Also noted PA placed a CIR consult  ?  ?  ? ?Expected Discharge Plan and Services ?  ?  ?  ?  ?  ?                ?  ?  ?  ?  ?  ?  ?  ?  ?  ?  ? ? ?Social Determinants of Health (SDOH) Interventions ?  ? ?Readmission Risk Interventions ?   ? View : No data to display.  ?  ?  ?  ? ? ?

## 2021-07-05 NOTE — Progress Notes (Signed)
Inpatient Rehabilitation Admissions Coordinator  ? ?OT recommendations currently for [facility]. I will not pursue CIR admit at this time. ? ?Danne Baxter, RN, MSN ?Rehab Admissions Coordinator ?[phone] ?07/05/2021 11:55 AM ? ?

## 2021-07-06 ENCOUNTER — Other Ambulatory Visit: Payer: Self-pay | Admitting: Cardiology

## 2021-07-06 DIAGNOSIS — Z951 Presence of aortocoronary bypass graft: Secondary | ICD-10-CM

## 2021-07-06 DIAGNOSIS — S32402A Unspecified fracture of left acetabulum, initial encounter for closed fracture: Secondary | ICD-10-CM | POA: Diagnosis not present

## 2021-07-06 LAB — BASIC METABOLIC PANEL
Anion gap: 8 (ref 5–15)
BUN: 16 mg/dL (ref 6–20)
CO2: 27 mmol/L (ref 22–32)
Calcium: 8.7 mg/dL — ABNORMAL LOW (ref 8.9–10.3)
Chloride: 101 mmol/L (ref 98–111)
Creatinine, Ser: 1.15 mg/dL (ref 0.61–1.24)
GFR, Estimated: >60 mL/min (ref >60)
Glucose, Bld: 88 mg/dL (ref 70–99)
Potassium: 3.9 mmol/L (ref 3.5–5.1)
Sodium: 136 mmol/L (ref 135–145)

## 2021-07-06 LAB — CBC
HCT: 36.1 % — ABNORMAL LOW (ref 39.0–52.0)
Hemoglobin: 11.9 g/dL — ABNORMAL LOW (ref 13.0–17.0)
MCH: 27.2 pg (ref 26.0–34.0)
MCHC: 33 g/dL (ref 30.0–36.0)
MCV: 82.6 fL (ref 80.0–100.0)
Platelets: 263 10*3/uL (ref 150–400)
RBC: 4.37 MIL/uL (ref 4.22–5.81)
RDW: 14.4 % (ref 11.5–15.5)
WBC: 13 10*3/uL — ABNORMAL HIGH (ref 4.0–10.5)
nRBC: 0 % (ref 0.0–0.2)

## 2021-07-06 MED ORDER — ISOSORBIDE MONONITRATE ER 30 MG PO TB24
15.0000 mg | ORAL_TABLET | Freq: Every day | ORAL | Status: DC
  Administered 2021-07-07 – 2021-07-08 (×2): 15 mg via ORAL
  Filled 2021-07-06 (×2): qty 1

## 2021-07-06 MED ORDER — POLYETHYLENE GLYCOL 3350 17 G PO PACK
17.0000 g | PACK | Freq: Every day | ORAL | Status: DC
  Administered 2021-07-06 – 2021-07-08 (×3): 17 g via ORAL
  Filled 2021-07-06 (×3): qty 1

## 2021-07-06 MED ORDER — CARVEDILOL 3.125 MG PO TABS
3.1250 mg | ORAL_TABLET | Freq: Two times a day (BID) | ORAL | Status: DC
  Administered 2021-07-06 – 2021-07-08 (×4): 3.125 mg via ORAL
  Filled 2021-07-06 (×4): qty 1

## 2021-07-06 NOTE — Progress Notes (Addendum)
Physical Therapy Treatment ?Patient Details ?Name: Billy [name] ?MRN: [id number] ?DOB: [birth year] ?Today's Date: 07/06/2021 ? ? ?History of Present Illness Pt is a 56 y.o. male admitted 07/01/21 after fall at home sustaining L hip acetabular fx dislocation. S/p closed reduction L hip fx dislocation with skeletal traction placement 4/1. S/p L acetabular ORIF 4/3. Oncology consulted for postoperative radiation tx for prevention of heterotopic ossification postoperatively. PMH includes HF, CAD, PAF, ICD placement, PAD, CKD, obesity. ?  ?PT Comments  ? ? Pt progressing well with mobility. Today's session focused on transfer and gait training with RW; pt demonstrates good ability to maintain LLE TDWB and posterior hip precautions with intermittent cues. Pt hopeful for d/c home tomorrow; will trial stair training next session. Continue to recommend HHPT services to maximize functional mobility and independence upon return home. ?   ?Recommendations for follow up therapy are one component of a multi-disciplinary discharge planning process, led by the attending physician.  Recommendations may be updated based on patient status, additional functional criteria and insurance authorization. ? ?Follow Up Recommendations ? Home health PT ?  ?  ?Assistance Recommended at Discharge Intermittent Supervision/Assistance  ?Patient can return home with the following A little help with walking and/or transfers;A little help with bathing/dressing/bathroom;Assistance with cooking/housework;Assist for transportation;Help with stairs or ramp for entrance ?  ?Equipment Recommendations ? Bariatric 3in1; Bariatric rolling walker  ?  ?Recommendations for Other Services   ? ? ?  ?Precautions / Restrictions Precautions ?Precautions: Posterior Hip;Fall ?Precaution Comments: pt recalled TDWB and 1/3 posterior hip prec (no hip flex >90'); reeducated on posterior hip precautions ?Restrictions ?Weight Bearing Restrictions: Yes ?LLE Weight Bearing:  Touchdown weight bearing  ?  ? ?Mobility ? Bed Mobility ?Overal bed mobility: Modified Independent ?Bed Mobility: Supine to Sit ?  ?  ?  ?  ?  ?  ?  ? ?Transfers ?Overall transfer level: Needs assistance ?Equipment used: Rolling walker (2 wheels) ?Transfers: Sit to/from Stand ?Sit to Stand: Min guard ?  ?  ?  ?  ?  ?General transfer comment: cues for L knee extension to maintain TDWB precautions when going to sit ?  ? ?Ambulation/Gait ?Ambulation/Gait assistance: Min guard ?Gait Distance (Feet): 100 Feet ?Assistive device: Rolling walker (2 wheels) ?  ?Gait velocity: Decreased ?  ?  ?General Gait Details: Slow, antalgic gait with RW and intermittent min guard for balance; intermittent verbal cues to ensure LLE TDWB precautions; cues for activity pacing ? ? ?Stairs ?  ?  ?  ?  ?  ? ? ?Wheelchair Mobility ?  ? ?Modified Rankin (Stroke Patients Only) ?  ? ? ?  ?Balance Overall balance assessment: Needs assistance ?Sitting-balance support: No upper extremity supported ?Sitting balance-Leahy Scale: Good ?Sitting balance - Comments: assist to don bilateral socks ?  ?Standing balance support: Single extremity supported, No upper extremity supported, During functional activity ?Standing balance-Leahy Scale: Fair ?  ?  ?  ?  ?  ?  ?  ?  ?  ?  ?  ?  ?  ? ?  ?Cognition Arousal/Alertness: Awake/alert ?Behavior During Therapy: [facility] for tasks assessed/performed, Flat affect ?Overall Cognitive Status: No family/caregiver present to determine baseline cognitive functioning ?Area of Impairment: Memory, Problem solving ?  ?  ?  ?  ?  ?  ?  ?  ?  ?  ?Memory: Decreased recall of precautions ?  ?  ?  ?Problem Solving: Requires verbal cues ?General Comments: pt appears fatigued, also recently medicated with pain meds ?  ?  ? ?  ?  Exercises General Exercises - Lower Extremity ?Ankle Circles/Pumps: AROM, Both, Seated ?Quad Sets: AROM, Left, Seated ? ?  ?General Comments General comments (skin integrity, edema, etc.): reviewed education and  d/c recommendations; pt agreeable to HHPT services and bariatric-sized RW. pt hopeful for d/c home tomorrow ?  ?  ? ?Pertinent Vitals/Pain Pain Assessment ?Pain Assessment: Faces ?Faces Pain Scale: Hurts little more ?Pain Location: LLE ?Pain Descriptors / Indicators: Discomfort, Grimacing ?Pain Intervention(s): Monitored during session, Limited activity within patient's tolerance, Premedicated before session  ? ? ?Home Living   ?  ?  ?  ?  ?  ?  ?  ?  ?  ?   ?  ?Prior Function    ?  ?  ?   ? ?PT Goals (current goals can now be found in the care plan section) Progress towards PT goals: Progressing toward goals ? ?  ?Frequency ? ? ? Min 5X/week ? ? ? ?  ?PT Plan Current plan remains appropriate  ? ? ?Co-evaluation   ?  ?  ?  ?  ? ?  ?AM-PAC PT "6 Clicks" Mobility   ?Outcome Measure ? Help needed turning from your back to your side while in a flat bed without using bedrails?: None ?Help needed moving from lying on your back to sitting on the side of a flat bed without using bedrails?: None ?Help needed moving to and from a bed to a chair (including a wheelchair)?: A Little ?Help needed standing up from a chair using your arms (e.g., wheelchair or bedside chair)?: A Little ?Help needed to walk in hospital room?: A Little ?Help needed climbing 3-5 steps with a railing? : A Lot ?6 Click Score: 19 ? ?  ?End of Session Equipment Utilized During Treatment: Gait belt ?Activity Tolerance: Patient tolerated treatment well ?Patient left: in chair;with call bell/phone within reach;with chair alarm set ?Nurse Communication: Mobility status ?PT Visit Diagnosis: Other abnormalities of gait and mobility (R26.89) ?  ? ? ?Time: 0454-0981 ?PT Time Calculation (min) (ACUTE ONLY): 25 min ? ?Charges:  $Gait Training: 8-22 mins ?$Self Care/Home Management: 8-22          ?          ? ?Mabeline Caras, PT, DPT ?Acute Rehabilitation Services  ?Pager [phone] ?Office [phone] ? ?Derry Lory ?07/06/2021, 1:10 PM ? ?

## 2021-07-06 NOTE — Progress Notes (Signed)
? ?                              Orthopaedic Trauma Service Progress Note ? ?Patient ID: ?Billy [name] ?MRN: [id number] ?DOB/AGE: [birth year] 56 y.o. ? ?Subjective: ? ?Doing very well ?Minimal pain  ? ?Doing well with therapies  ?XRT went well yesterday  ? ?No new issues  ? ? ?ROS ?As above ? ?Objective:  ? ?VITALS:   ?Vitals:  ? 07/05/21 1550 07/05/21 2024 07/06/21 0451 07/06/21 0740  ?BP: 125/76 114/72 113/67 99/61  ?Pulse: 68 73 75 77  ?Resp: '20 16 18 16  '$ ?Temp: 97.9 ?F (36.6 ?C) 98.3 ?F (36.8 ?C) 98 ?F (36.7 ?C) 98.1 ?F (36.7 ?C)  ?TempSrc: Oral Oral Oral Oral  ?SpO2: 100% 97% 100% 99%  ? ? ?Estimated body mass index is 36.96 kg/m? as calculated from the following: ?  Height as of 06/17/21: '5\' 11"'$  (1.803 m). ?  Weight as of 06/17/21: 120.2 kg. ? ? ?Intake/Output   ?   04/04 0701 ?04/05 0700 04/05 0701 ?04/06 0700  ? P.O. 1380   ? I.V.    ? IV Piggyback    ? Total Intake 1380   ? Urine 3400   ? Blood    ? Total Output 3400   ? Net -2020   ?     ?  ? ?LABS ? ?Results for orders placed or performed during the hospital encounter of 07/01/21 (from the past 24 hour(s))  ?Basic metabolic panel     Status: Abnormal  ? Collection Time: 07/06/21  1:20 AM  ?Result Value Ref Range  ? Sodium 136 135 - 145 mmol/L  ? Potassium 3.9 3.5 - 5.1 mmol/L  ? Chloride 101 98 - 111 mmol/L  ? CO2 27 22 - 32 mmol/L  ? Glucose, Bld 88 70 - 99 mg/dL  ? BUN 16 6 - 20 mg/dL  ? Creatinine, Ser 1.15 0.61 - 1.24 mg/dL  ? Calcium 8.7 (L) 8.9 - 10.3 mg/dL  ? GFR, Estimated >60 >60 mL/min  ? Anion gap 8 5 - 15  ?CBC     Status: Abnormal  ? Collection Time: 07/06/21  1:20 AM  ?Result Value Ref Range  ? WBC 13.0 (H) 4.0 - 10.5 K/uL  ? RBC 4.37 4.22 - 5.81 MIL/uL  ? Hemoglobin 11.9 (L) 13.0 - 17.0 g/dL  ? HCT 36.1 (L) 39.0 - 52.0 %  ? MCV 82.6 80.0 - 100.0 fL  ? MCH 27.2 26.0 - 34.0 pg  ? MCHC 33.0 30.0 - 36.0 g/dL  ? RDW 14.4 11.5 - 15.5 %  ? Platelets 263 150 - 400 K/uL  ? nRBC 0.0 0.0 - 0.2 %  ? ? ? ?PHYSICAL  EXAM:  ? ?Gen: awake and alert, resting comfortably in chair ?Lungs: unlabored ?Ext:  ?     Left Lower extremity  ?            Length and rotation of left leg appear symmetric to contralateral side.  Resting position appears appropriate ?            Dressing left hip is clean, dry and intact ?            Extremity is warm ?            + DP pulse ?            DPN, SPN, TN sensory function intact            ?  EHL, FHL, lesser toe motor functions intact.  Ankle flexion, extension, inversion and eversion intact.   ?  ? ?Assessment/Plan: ?2 Days Post-Op  ? ? ?Anti-infectives (From admission, onward)  ? ? Start     Dose/Rate Route Frequency Ordered Stop  ? 07/04/21 1800  ceFAZolin (ANCEF) IVPB 2g/100 mL premix       ? 2 g ?200 mL/hr over 30 Minutes Intravenous Every 6 hours 07/04/21 1332 07/05/21 0754  ? 07/04/21 1147  vancomycin (VANCOCIN) powder  Status:  Discontinued       ?   As needed 07/04/21 1150 07/04/21 1233  ? 07/04/21 0800  ceFAZolin (ANCEF) IVPB 3g/100 mL premix       ? 3 g ?200 mL/hr over 30 Minutes Intravenous On call to O.R. 07/03/21 1548 07/04/21 0952  ? ?  ?. ? ?POD/HD#: 60 ? ?56 year old male with complex cardiac history a ground-level fall with comminuted left acetabulum fracture ?  ?-Comminuted left acetabulum fracture s/p ORIF ?            Touchdown weightbearing left leg with assistance ?            Posterior hip precautions left hip ?            PT and OT evaluations ?            Ice as needed ?            Dressing changes as needed  ?             ?            XRT completed for HO prophylaxis  ?  ?- Pain management: ?            Multimodal ?            Burtis Junes orals over IV ?  ?- ABL anemia/Hemodynamics ?            Monitor             ?            stable  ?  ?- Medical issues  ?            Per hospitalist and cardiology ?  ?- DVT/PE prophylaxis: ?            eliquis and plavix restarted  ?  ?- ID:  ?            Perioperative antibiotics ?  ?- Metabolic Bone Disease: ?            Vitamin D  deficiency ?                        Supplement ?            Very unusual fracture for someone at his age given mechanism. ?            Strongly would recommend bone density scan as well as testosterone panel.  We will order initial testosterone panel while he is here.  If abnormal would definitely recommend repeating in 8 to 12 weeks and then obtaining bone density scan as well.  I do suspect that he has osteoporosis. ?            Outpatient referral to osteoporosis clinic ?  ?- Activity: ?            As above ?  ?- Impediments to fracture healing: ?  Poor bone quality ?            Vitamin D deficiency ?- Dispo: ?           continue with therapies  ? Ortho issues stable  ? Home tomorrow vs Friday? ?  ? ?Jari Pigg, PA-C ?[phone] (C) ?07/06/2021, 10:07 AM ? ?[facility] ?[place] [address] ?[phone] Jenetta Downer) ?[phone] (F) ? ? ? ?After 5pm and on the weekends please log on to Amion, go to orthopaedics and the look under the [facility] Call for the provider(s) on call. You can also call our office at [phone] and then follow the prompts to be connected to the call team.  ? Patient ID: Billy [name], male   DOB: [birth year], 56 y.o.   MRN: [id number] ? ?

## 2021-07-06 NOTE — TOC Initial Note (Addendum)
Transition of Care (TOC) - Initial/Assessment Note  ? ? ?Patient Details  ?Name: Billy [name] ?MRN: [id number] ?Date of Birth: March 28, 1966 ? ?Transition of Care (TOC) CM/SW Contact:    ?Marilu Favre, RN ?Phone Number: ?07/06/2021, 11:36 AM ? ?Clinical Narrative:                 ?Patient from home with wife. Discussed home health PT . Patient in agreement. NCM confirmed face sheet information. No preference.  ? ?Tommi Rumps with [facility] accepted [facility] referral. ? ?Bariatric rolling walker and bariatric 3 in 1 , ordered with Freda Munro with [facility]  ?    ?Patient's wife requesting Risk analyst through insurance. Paul PA in agreement. NCM entered a order for same asked PA to sign. NCM will faxed  to [facility] ,fax [phone] and  [phone]  they will contact wife directly regarding coverage. Wife voiced understanding  ?Expected Discharge Plan: [place] ?  ? ? ?Patient Goals and CMS Choice ?Patient states their goals for this hospitalization and ongoing recovery are:: to go home ?CMS Medicare.gov Compare Post Acute Care list provided to:: Patient ?Choice offered to / list presented to : Patient ? ?Expected Discharge Plan and Services ?Expected Discharge Plan: [place] ?  ?Discharge Planning Services: CM Consult ?Post Acute Care Choice: Home Health ?  ?                ?  ?  ?  ?  ?  ?  ?  ?  ?  ?  ? ?Prior Living Arrangements/Services ?  ?  ?  ?       ?  ?  ?  ?  ? ?Activities of Daily Living ?Home Assistive Devices/Equipment: Eyeglasses ?ADL Screening (condition at time of admission) ?Patient's cognitive ability adequate to safely complete daily activities?: Yes ?Is the patient deaf or have difficulty hearing?: No ?Does the patient have difficulty seeing, even when wearing glasses/contacts?: No ?Does the patient have difficulty concentrating, remembering, or making decisions?: No ?Patient able to express need for assistance with ADLs?: Yes ?Does the patient have difficulty  dressing or bathing?: No ?Independently performs ADLs?: Yes (appropriate for developmental age) ?Does the patient have difficulty walking or climbing stairs?: No ?Weakness of Legs: None ?Weakness of Arms/Hands: None ? ?Permission Sought/Granted ?  ?Permission granted to share information with : No ?   ?   ?   ?   ? ?Emotional Assessment ?  ?  ?  ?  ?  ?  ? ?Admission diagnosis:  Closed left acetabular fracture ([place]) [S32.402A] ?Dislocation of left hip, initial encounter ([place]) [C62.376E] ?Fall, initial encounter [W19.XXXA] ?Closed nondisplaced fracture of left acetabulum, unspecified portion of acetabulum, initial encounter ([place]) [S32.402A] ?Patient Active Problem List  ? Diagnosis Date Noted  ? Vitamin D deficiency 07/03/2021  ? Pathologic fracture of left acetabulum 07/03/2021  ? Chronic HFrEF (heart failure with reduced ejection fraction) ([place]) 07/02/2021  ? Closed left acetabular fracture ([place]) 07/01/2021  ? Dislocated hip, left, initial encounter ([place]) 07/01/2021  ? Stage 3a chronic kidney disease (CKD) ([name]) - baseline SCr 1.3-1.6 07/01/2021  ? Tremor, essential 03/01/2021  ? Hypothyroidism 03/01/2021  ? Healthcare maintenance 11/29/2020  ? Elevated TSH 11/29/2020  ? Elevated glucose 11/29/2020  ? Snores 11/29/2020  ? Morbid obesity ([place]) 11/29/2020  ? Ventricular tachycardia ([place]) 05/23/2020  ? SVT (supraventricular tachycardia) ([place])   ? Status post angioplasty 02/15/2019  ? Unstable angina (  [place]) 01/22/2019  ? Coronary artery disease involving native coronary artery of native heart with angina pectoris ([place]) 01/22/2019  ? Irritable bowel syndrome 08/23/2017  ? Seasonal allergies 04/19/2017  ? S/P quadruple vessel bypass 04/19/2017  ? Prediabetes 04/19/2017  ? GERD (gastroesophageal reflux disease) 01/31/2016  ? Implantable cardioverter-defibrillator (ICD) in situ 06/12/2011  ? VENTRICULAR FIBRILLATION 03/02/2009  ? Ischemic cardiomyopathy 08/25/2008  ? Elevated cholesterol 01/15/2008  ? Essential hypertension  01/15/2008  ? Hx of CABG 01/15/2008  ? PERIPHERAL VASCULAR DISEASE 01/15/2008  ? ?PCP:  [name], MD ?Pharmacy:   ?[id number] [place] [place] [address] ?[id number]?Phone: [phone]: [phone]? ?[place]?Phone: [phone]: [phone]? ? ? ? ?Social Determinants of Health (SDOH) Interventions ?  ? ?Readmission Risk Interventions ?   ? View : No data to display.  ?  ?  ?  ? ? ? ?

## 2021-07-06 NOTE — Progress Notes (Signed)
Mobility Specialist Progress Note: ? ? 07/06/21 1422  ?Mobility  ?Activity Ambulated with assistance in room  ?Level of Assistance Standby assist, set-up cues, supervision of patient - no hands on  ?Assistive Device Front wheel walker  ?Distance Ambulated (ft) 10 ft  ?Activity Response Tolerated well  ?$Mobility charge 1 Mobility  ? ?Pt received asking to go back to bed. No complaints of pain. Left EOB with call bell in reach and all needs met.  ? ?Billy [name] ?Mobility Specialist ?Primary Phone [phone] ? ?

## 2021-07-06 NOTE — Progress Notes (Signed)
?PROGRESS NOTE ? ?Billy [name]  HDQ:[id number] DOB: 1965/05/23 DOA: 07/01/2021 ?PCP: [name], MD  ? ?Brief Narrative: ?Patient is a 56 year old male with history of V-fib cardiac arrest status post ICD placement in 2010, chronic heart failure with reduced ejection fraction, coronary artery disease status post CABG, paroxysmal A-fib status post ablation 2022 who presented to the ED from home after slipping in the wet grass resulting in immediate left hip pain.  X-ray confirmed dislocated left hip with, comminuted acetabular  fracture .Orthopedics consulted, underwent ORIF on 4/3.  Also undergoing postoperative radiation to prevent heterotopic ossification.  PT/OT recommending home and then discharged, possible discharge home tomorrow. ? ?Assessment & Plan: ? ?Principal Problem: ?  Closed left acetabular fracture ([place]) ?Active Problems: ?  Dislocated hip, left, initial encounter ([place]) ?  Essential hypertension ?  Implantable cardioverter-defibrillator (ICD) in situ ?  GERD (gastroesophageal reflux disease) ?  S/P quadruple vessel bypass ?  Morbid obesity ([place]) ?  Stage 3a chronic kidney disease (CKD) ([name]) - baseline SCr 1.3-1.6 ?  Hypothyroidism ?  Chronic HFrEF (heart failure with reduced ejection fraction) ([place]) ?  Vitamin D deficiency ?  Pathologic fracture of left acetabulum ? ?Closed left acetabular fracture: Status post ORIF on 4/3.  Underwent radiation therapy to reduce hypertrophic ossification.  PT/OT recommend home health on discharge per orthopedics following.  Very atypical to have fracture at this agein a male.  Osteoporosis suspected.  Testosterone level pending.  We recommend to do a bone density scan as an outpatient and follow-up with outpatient osteoporosis clinic ? ?Stage IIIa CKD: Baseline creatinine ranged from 1.3-1.6.  Currently kidney function at baseline ? ?Coronary artery disease: Status post quadruple vessel bypass.  No anginal symptoms.  On beta-blocker, Imdur, statin,  Zetia.,  Plavix ? ?History of A-fib: Status post ablation, status post implantable cardioverter-defibrillator placement.  Currently in normal sinus rhythm.  On Eliquis for anticoagulation.  On Coreg for rate control ? ?Hypertension: Currently blood pressure soft.  Dose of Imdur and carvedilol reduced ? ?History of systolic congestive heart failure: Currently following.  On Lasix, Entresto at home. ? ?Hypothyroidism: Continue Synthyroid ? ?Obesity: BMI 36.9 ? ? ? ?  ?  ? ?DVT prophylaxis:SCDs Start: 07/04/21 1333 ?SCDs Start: 07/02/21 0434 ?SCDs Start: 07/02/21 0434 ?apixaban (ELIQUIS) tablet 5 mg  ? ?  Code Status: Full Code ? ?Family Communication: None at bedside ? ?Patient status:Inpatient ? ?Patient is from :Home ? ?Anticipated discharge JJ:HERD ? ?Estimated DC date:tomorrow ? ? ?Consultants: Ortho ? ?Procedures:ORIF ? ?Antimicrobials:  ?Anti-infectives (From admission, onward)  ? ? Start     Dose/Rate Route Frequency Ordered Stop  ? 07/04/21 1800  ceFAZolin (ANCEF) IVPB 2g/100 mL premix       ? 2 g ?200 mL/hr over 30 Minutes Intravenous Every 6 hours 07/04/21 1332 07/05/21 0754  ? 07/04/21 1147  vancomycin (VANCOCIN) powder  Status:  Discontinued       ?   As needed 07/04/21 1150 07/04/21 1233  ? 07/04/21 0800  ceFAZolin (ANCEF) IVPB 3g/100 mL premix       ? 3 g ?200 mL/hr over 30 Minutes Intravenous On call to O.R. 07/03/21 1548 07/04/21 0952  ? ?  ? ? ?Subjective: ? ?Patient seen and examined at the bedside this morning.  He was working with a physical therapist.  Looks comfortable, ambulating well with the help of walker.  Denies any new complaints ? ?Objective: ?Vitals:  ? 07/05/21 1550 07/05/21 2024 07/06/21 0451 07/06/21 0740  ?BP:  125/76 114/72 113/67 99/61  ?Pulse: 68 73 75 77  ?Resp: '20 16 18 16  '$ ?Temp: 97.9 ?F (36.6 ?C) 98.3 ?F (36.8 ?C) 98 ?F (36.7 ?C) 98.1 ?F (36.7 ?C)  ?TempSrc: Oral Oral Oral Oral  ?SpO2: 100% 97% 100% 99%  ? ? ?Intake/Output Summary (Last 24 hours) at 07/06/2021 1235 ?Last data  filed at 07/06/2021 0413 ?Gross per 24 hour  ?Intake 900 ml  ?Output 2450 ml  ?Net -1550 ml  ? ?There were no vitals filed for this visit. ? ?Examination: ? ?General exam: Overall comfortable, not in distress, obese ?HEENT: PERRL ?Respiratory system:  no wheezes or crackles  ?Cardiovascular system: S1 & S2 heard, RRR.  ?Gastrointestinal system: Abdomen is nondistended, soft and nontender. ?Central nervous system: Alert and oriented ?Extremities: No edema, no clubbing ,no cyanosis ?Skin: No rashes, no ulcers,no icterus   ? ? ?Data Reviewed: I have personally reviewed following labs and imaging studies ? ?CBC: ?Recent Labs  ?Lab 07/01/21 ?2240 07/04/21 ?0233 07/05/21 ?5329 07/06/21 ?0120  ?WBC 11.5* 9.3 12.9* 13.0*  ?NEUTROABS 8.1*  --   --   --   ?HGB 16.0 12.8* 12.4* 11.9*  ?HCT 47.1 37.5* 36.1* 36.1*  ?MCV 82.1 82.6 81.9 82.6  ?PLT 271 201 208 263  ? ?Basic Metabolic Panel: ?Recent Labs  ?Lab 07/01/21 ?2240 07/03/21 ?0052 07/04/21 ?0233 07/05/21 ?9242 07/06/21 ?0120  ?NA 141 136 135 135 136  ?K 3.8 4.1 3.6 4.1 3.9  ?CL 109 102 103 100 101  ?CO2 '24 29 27 26 27  '$ ?GLUCOSE 93 104* 101* 131* 88  ?BUN '13 9 12 11 16  '$ ?CREATININE 1.41* 1.41* 1.35* 1.17 1.15  ?CALCIUM 9.0 8.8* 8.7* 8.7* 8.7*  ?MG  --   --  1.8  --   --   ? ? ? ?Recent Results (from the past 240 hour(s))  ?Surgical PCR screen     Status: None  ? Collection Time: 07/03/21  3:41 PM  ? Specimen: Nasal Mucosa; Nasal Swab  ?Result Value Ref Range Status  ? MRSA, PCR NEGATIVE NEGATIVE Final  ? Staphylococcus aureus NEGATIVE NEGATIVE Final  ?  Comment: (NOTE) ?The Xpert SA Assay (FDA approved for NASAL specimens in patients 20 ?years of age and older), is one component of a comprehensive ?surveillance program. It is not intended to diagnose infection nor to ?guide or monitor treatment. ?Performed at [facility], [place] [address] [place], [place] ?[address] ?  ?  ? ?Radiology Studies: ?DG Pelvis Comp Min 3V ? ?Result Date: 07/04/2021 ?CLINICAL DATA:  Left  acetabular ORIF EXAM: JUDET PELVIS - 3+ VIEW COMPARISON:  None. FINDINGS: Three view radiograph pelvis demonstrates interval left acetabular ORIF with a malleable plate and screws demonstrating ORIF of the anterior column and probable posterior wall of the left acetabulum. Multiple fracture fragments arising from the anterior wall of the left acetabulum are again identified, demonstrating mild anterolateral displacement. Left femoral head appears seated within the left acetabulum. Pelvis is otherwise intact. Visualized right hip is unremarkable. Right common iliac artery stenting has been performed. IMPRESSION: Interval ORIF left anterior column and posterior acetabular wall. Multiple residual mildly displaced fracture fragments of the anterior wall. No dislocation. Electronically Signed   By: Fidela Salisbury M.D.   On: 07/04/2021 19:52   ? ?Scheduled Meds: ? apixaban  5 mg Oral BID  ? vitamin C  500 mg Oral Daily  ? atorvastatin  80 mg Oral Daily  ? calcium citrate  200 mg of elemental  calcium Oral BID  ? carvedilol  12.5 mg Oral BID WC  ? Chlorhexidine Gluconate Cloth  6 each Topical Daily  ? cholecalciferol  2,000 Units Oral BID  ? clopidogrel  75 mg Oral Daily  ? docusate sodium  100 mg Oral BID  ? ezetimibe  10 mg Oral Daily  ? furosemide  40 mg Oral Daily  ? isosorbide mononitrate  30 mg Oral Daily  ? levothyroxine  50 mcg Oral QAC breakfast  ? methocarbamol  1,000 mg Oral TID  ? montelukast  10 mg Oral Daily  ? mupirocin ointment  1 application. Nasal BID  ? pantoprazole  40 mg Oral Daily  ? potassium chloride SA  20 mEq Oral Daily  ? sacubitril-valsartan  1 tablet Oral BID  ? Vitamin D (Ergocalciferol)  50,000 Units Oral Q7 days  ? zinc sulfate  220 mg Oral Daily  ? ?Continuous Infusions: ? ? LOS: 5 days  ? ?Shelly Coss, MD ?[facility] ?P4/08/2021, 12:35 PM   ?

## 2021-07-06 NOTE — Telephone Encounter (Signed)
Prescription refill request for Eliquis received. ?Indication:Afib ?Last office visit:2/23 ?Scr:1.1 ?Age: 56 ?Weight:120.2 kg ? ?Prescription refilled ? ?

## 2021-07-07 ENCOUNTER — Ambulatory Visit: Payer: Medicare HMO

## 2021-07-07 DIAGNOSIS — S32402A Unspecified fracture of left acetabulum, initial encounter for closed fracture: Secondary | ICD-10-CM | POA: Diagnosis not present

## 2021-07-07 LAB — CBC WITH DIFFERENTIAL/PLATELET
Abs Immature Granulocytes: 0.09 10*3/uL — ABNORMAL HIGH (ref 0.00–0.07)
Basophils Absolute: 0.1 10*3/uL (ref 0.0–0.1)
Basophils Relative: 1 %
Eosinophils Absolute: 0.3 10*3/uL (ref 0.0–0.5)
Eosinophils Relative: 3 %
HCT: 35.1 % — ABNORMAL LOW (ref 39.0–52.0)
Hemoglobin: 12.1 g/dL — ABNORMAL LOW (ref 13.0–17.0)
Immature Granulocytes: 1 %
Lymphocytes Relative: 24 %
Lymphs Abs: 2.7 10*3/uL (ref 0.7–4.0)
MCH: 28.1 pg (ref 26.0–34.0)
MCHC: 34.5 g/dL (ref 30.0–36.0)
MCV: 81.6 fL (ref 80.0–100.0)
Monocytes Absolute: 1.6 10*3/uL — ABNORMAL HIGH (ref 0.1–1.0)
Monocytes Relative: 14 %
Neutro Abs: 6.3 10*3/uL (ref 1.7–7.7)
Neutrophils Relative %: 57 %
Platelets: 264 10*3/uL (ref 150–400)
RBC: 4.3 MIL/uL (ref 4.22–5.81)
RDW: 14.6 % (ref 11.5–15.5)
WBC: 11 10*3/uL — ABNORMAL HIGH (ref 4.0–10.5)
nRBC: 0 % (ref 0.0–0.2)

## 2021-07-07 LAB — SEX HORMONE BINDING GLOBULIN: Sex Hormone Binding: 48.4 nmol/L (ref 19.3–76.4)

## 2021-07-07 LAB — BASIC METABOLIC PANEL
Anion gap: 7 (ref 5–15)
BUN: 13 mg/dL (ref 6–20)
CO2: 30 mmol/L (ref 22–32)
Calcium: 8.8 mg/dL — ABNORMAL LOW (ref 8.9–10.3)
Chloride: 99 mmol/L (ref 98–111)
Creatinine, Ser: 1.1 mg/dL (ref 0.61–1.24)
GFR, Estimated: >60 mL/min (ref >60)
Glucose, Bld: 99 mg/dL (ref 70–99)
Potassium: 3.4 mmol/L — ABNORMAL LOW (ref 3.5–5.1)
Sodium: 136 mmol/L (ref 135–145)

## 2021-07-07 LAB — TESTOSTERONE: Testosterone: 118 ng/dL — ABNORMAL LOW (ref 264–916)

## 2021-07-07 MED ORDER — POTASSIUM CHLORIDE CRYS ER 20 MEQ PO TBCR
20.0000 meq | EXTENDED_RELEASE_TABLET | Freq: Once | ORAL | Status: AC
Start: 2021-07-07 — End: 2021-07-07
  Administered 2021-07-07: 20 meq via ORAL
  Filled 2021-07-07: qty 1

## 2021-07-07 NOTE — Progress Notes (Signed)
?PROGRESS NOTE ? ?Billy [name]  ZOX:[id number] DOB: 1966-03-19 DOA: 07/01/2021 ?PCP: [name], MD  ? ?Brief Narrative: ?Patient is a 56 year old male with history of V-fib cardiac arrest status post ICD placement in 2010, chronic heart failure with reduced ejection fraction, coronary artery disease status post CABG, paroxysmal A-fib status post ablation 2022 who presented to the ED from home after slipping in the wet grass resulting in immediate left hip pain.  X-ray confirmed dislocated left hip with, comminuted acetabular  fracture .Orthopedics consulted, underwent ORIF on 4/3.  Also underwent  postoperative radiation to prevent heterotopic ossification.  PT/OT recommending home health,possible discharge home tomorrow. ? ?Assessment & Plan: ? ?Principal Problem: ?  Closed left acetabular fracture ([place]) ?Active Problems: ?  Dislocated hip, left, initial encounter ([place]) ?  Essential hypertension ?  Implantable cardioverter-defibrillator (ICD) in situ ?  GERD (gastroesophageal reflux disease) ?  S/P quadruple vessel bypass ?  Morbid obesity ([place]) ?  Stage 3a chronic kidney disease (CKD) ([name]) - baseline SCr 1.3-1.6 ?  Hypothyroidism ?  Chronic HFrEF (heart failure with reduced ejection fraction) ([place]) ?  Vitamin D deficiency ?  Pathologic fracture of left acetabulum ? ?Closed left acetabular fracture: Status post ORIF on 4/3.  Underwent radiation therapy to reduce hypertrophic ossification.  PT/OT recommend home health on discharge per orthopedics following.  Very atypical to have fracture at this agein a male.  Osteoporosis suspected.  Total testosterone level low,free testosterone pending. We recommend to follow-up with endocrinology as an outpatient. we recommend to do a bone density scan as an outpatient and follow-up with outpatient osteoporosis clinic ? ?Stage IIIa CKD: Baseline creatinine ranged from 1.3-1.6.  Currently kidney function at baseline ? ?Coronary artery disease: Status post quadruple  vessel bypass.  No anginal symptoms.  On beta-blocker, Imdur, statin, Zetia.,  Plavix ? ?History of A-fib: Status post ablation, status post implantable cardioverter-defibrillator placement.  Currently in normal sinus rhythm.  On Eliquis for anticoagulation.  On Coreg for rate control ? ?Hypertension: Currently blood pressure soft.  Dose of Imdur and carvedilol reduced ? ?History of systolic congestive heart failure: Currently following.  On Lasix, Entresto at home. ? ?Hypothyroidism: Continue Synthyroid ? ?Obesity: BMI 36.9 ? ? ? ?  ?  ? ?DVT prophylaxis:SCDs Start: 07/04/21 1333 ?SCDs Start: 07/02/21 0434 ?SCDs Start: 07/02/21 0434 ?apixaban (ELIQUIS) tablet 5 mg  ? ?  Code Status: Full Code ? ?Family Communication: None at bedside ? ?Patient status:Inpatient ? ?Patient is from :Home ? ?Anticipated discharge WJ:XBJY ? ?Estimated DC date:tomorrow ? ? ?Consultants: Ortho ? ?Procedures:ORIF ? ?Antimicrobials:  ?Anti-infectives (From admission, onward)  ? ? Start     Dose/Rate Route Frequency Ordered Stop  ? 07/04/21 1800  ceFAZolin (ANCEF) IVPB 2g/100 mL premix       ? 2 g ?200 mL/hr over 30 Minutes Intravenous Every 6 hours 07/04/21 1332 07/05/21 0754  ? 07/04/21 1147  vancomycin (VANCOCIN) powder  Status:  Discontinued       ?   As needed 07/04/21 1150 07/04/21 1233  ? 07/04/21 0800  ceFAZolin (ANCEF) IVPB 3g/100 mL premix       ? 3 g ?200 mL/hr over 30 Minutes Intravenous On call to O.R. 07/03/21 1548 07/04/21 0952  ? ?  ? ? ?Subjective: ? ?Patient seen and examined at the bedside this morning.  Hemodynamically stable sitting in the chair.  Pain is still there but not uncontrolled.  Denies any new complaints ? ?Objective: ?Vitals:  ? 07/06/21 1601 07/06/21 2043  07/07/21 0439 07/07/21 5916  ?BP: 116/70 110/76 122/71 108/69  ?Pulse: 68 70 70 72  ?Resp: '16 16 16 16  '$ ?Temp: 98.6 ?F (37 ?C) 98.7 ?F (37.1 ?C) 98.4 ?F (36.9 ?C) 98 ?F (36.7 ?C)  ?TempSrc: Oral Oral Oral Oral  ?SpO2: 100% 93% 97% 97%  ? ? ?Intake/Output  Summary (Last 24 hours) at 07/07/2021 1130 ?Last data filed at 07/07/2021 1000 ?Gross per 24 hour  ?Intake 240 ml  ?Output 2970 ml  ?Net -2730 ml  ? ?There were no vitals filed for this visit. ? ?Examination: ? ? ?General exam: Overall comfortable, not in distress,obese ?HEENT: PERRL ?Respiratory system:  no wheezes or crackles  ?Cardiovascular system: S1 & S2 heard, RRR.  ?Gastrointestinal system: Abdomen is nondistended, soft and nontender. ?Central nervous system: Alert and oriented ?Extremities: No edema, no clubbing ,no cyanosis, surgical wound on the left hip ?Skin: No rashes, no ulcers,no icterus   ? ?Data Reviewed: I have personally reviewed following labs and imaging studies ? ?CBC: ?Recent Labs  ?Lab 07/01/21 ?2240 07/04/21 ?0233 07/05/21 ?3846 07/06/21 ?0120 07/07/21 ?6599  ?WBC 11.5* 9.3 12.9* 13.0* 11.0*  ?NEUTROABS 8.1*  --   --   --  6.3  ?HGB 16.0 12.8* 12.4* 11.9* 12.1*  ?HCT 47.1 37.5* 36.1* 36.1* 35.1*  ?MCV 82.1 82.6 81.9 82.6 81.6  ?PLT 271 201 208 263 264  ? ?Basic Metabolic Panel: ?Recent Labs  ?Lab 07/03/21 ?3570 07/04/21 ?0233 07/05/21 ?1779 07/06/21 ?0120 07/07/21 ?3903  ?NA 136 135 135 136 136  ?K 4.1 3.6 4.1 3.9 3.4*  ?CL 102 103 100 101 99  ?CO2 '29 27 26 27 30  '$ ?GLUCOSE 104* 101* 131* 88 99  ?BUN '9 12 11 16 13  '$ ?CREATININE 1.41* 1.35* 1.17 1.15 1.10  ?CALCIUM 8.8* 8.7* 8.7* 8.7* 8.8*  ?MG  --  1.8  --   --   --   ? ? ? ?Recent Results (from the past 240 hour(s))  ?Surgical PCR screen     Status: None  ? Collection Time: 07/03/21  3:41 PM  ? Specimen: Nasal Mucosa; Nasal Swab  ?Result Value Ref Range Status  ? MRSA, PCR NEGATIVE NEGATIVE Final  ? Staphylococcus aureus NEGATIVE NEGATIVE Final  ?  Comment: (NOTE) ?The Xpert SA Assay (FDA approved for NASAL specimens in patients 52 ?years of age and older), is one component of a comprehensive ?surveillance program. It is not intended to diagnose infection nor to ?guide or monitor treatment. ?Performed at [facility], [place] [address],  [place], [place] ?[address] ?  ?  ? ?Radiology Studies: ?No results found. ? ?Scheduled Meds: ? apixaban  5 mg Oral BID  ? vitamin C  500 mg Oral Daily  ? atorvastatin  80 mg Oral Daily  ? calcium citrate  200 mg of elemental calcium Oral BID  ? carvedilol  3.125 mg Oral BID WC  ? Chlorhexidine Gluconate Cloth  6 each Topical Daily  ? cholecalciferol  2,000 Units Oral BID  ? clopidogrel  75 mg Oral Daily  ? docusate sodium  100 mg Oral BID  ? ezetimibe  10 mg Oral Daily  ? furosemide  40 mg Oral Daily  ? isosorbide mononitrate  15 mg Oral Daily  ? levothyroxine  50 mcg Oral QAC breakfast  ? methocarbamol  1,000 mg Oral TID  ? montelukast  10 mg Oral Daily  ? mupirocin ointment  1 application. Nasal BID  ? pantoprazole  40 mg Oral Daily  ?  polyethylene glycol  17 g Oral Daily  ? potassium chloride SA  20 mEq Oral Daily  ? sacubitril-valsartan  1 tablet Oral BID  ? Vitamin D (Ergocalciferol)  50,000 Units Oral Q7 days  ? zinc sulfate  220 mg Oral Daily  ? ?Continuous Infusions: ? ? LOS: 6 days  ? ?Shelly Coss, MD ?[facility] ?P4/09/2021, 11:30 AM   ?

## 2021-07-07 NOTE — Progress Notes (Signed)
Physical Therapy Treatment ?Patient Details ?Name: Billy [name] ?MRN: [id number] ?DOB: Jul 23, 1965 ?Today's Date: 07/07/2021 ? ? ?History of Present Illness Pt is a 56 y.o. male admitted 07/01/21 after fall at home sustaining L hip acetabular fx dislocation. S/p closed reduction L hip fx dislocation with skeletal traction placement 4/1. S/p L acetabular ORIF 4/3. Oncology consulted for postoperative radiation tx for prevention of heterotopic ossification postoperatively. PMH includes HF, CAD, PAF, ICD placement, PAD, CKD, obesity. ? ?  ?PT Comments  ? ? Pt making good progress towards goals this session with focus on stair negotiation for safe d/c to home. Pt able to ascend/descend 3 stairs x2 in with good compliance with all WB and hip precautions and good power through BUE with min assist down to min guard. Anticipate safe discharge with family assistance once medically cleared, will follow acutely. Current plan remains appropriate to address deficits and maximize functional independence. Pt continues to benefit from skilled PT services to progress toward functional mobility goals.  ?  ?Recommendations for follow up therapy are one component of a multi-disciplinary discharge planning process, led by the attending physician.  Recommendations may be updated based on patient status, additional functional criteria and insurance authorization. ? ?Follow Up Recommendations ? Home health PT ?  ?  ?Assistance Recommended at Discharge Intermittent Supervision/Assistance  ?Patient can return home with the following A little help with walking and/or transfers;A little help with bathing/dressing/bathroom;Assistance with cooking/housework;Assist for transportation;Help with stairs or ramp for entrance ?  ?Equipment Recommendations ? Rolling walker (2 wheels)  ?  ?Recommendations for Other Services   ? ? ?  ?Precautions / Restrictions Precautions ?Precautions: Posterior Hip;Fall ?Precaution Booklet Issued: Yes (comment) ?Precaution  Comments: pt recalled TDWB and posterior hip prec ?Restrictions ?Weight Bearing Restrictions: Yes ?LLE Weight Bearing: Touchdown weight bearing  ?  ? ?Mobility ? Bed Mobility ?  ?  ?  ?  ?  ?  ?  ?General bed mobility comments: up in recliner ?  ? ?Transfers ?Overall transfer level: Needs assistance ?Equipment used: Rolling walker (2 wheels) ?Transfers: Sit to/from Stand ?Sit to Stand: Supervision ?  ?  ?  ?  ?  ?General transfer comment: no assist needed to stand ?  ? ?Ambulation/Gait ?Ambulation/Gait assistance: Min guard ?Gait Distance (Feet): 20 Feet ?Assistive device: Rolling walker (2 wheels) ?  ?Gait velocity: Decreased ?  ?  ?General Gait Details: Slow, antalgic gait with RW; intermittent verbal cues to ensure LLE TDWB precautions ? ? ?Stairs ?Stairs: Yes ?Stairs assistance: Min guard, Min assist ?Stair Management: One rail Right, Two rails, Sideways, Forwards ?Number of Stairs: 6 ?General stair comments: pt able to ascend/descend steps in stairwell with good compliance with WB precautions, min assist for sequencing and strategies, pt with good power through BUE ? ? ?Wheelchair Mobility ?  ? ?Modified Rankin (Stroke Patients Only) ?  ? ? ?  ?Balance Overall balance assessment: Needs assistance ?Sitting-balance support: No upper extremity supported ?Sitting balance-Leahy Scale: Good ?  ?  ?Standing balance support: Single extremity supported, No upper extremity supported, During functional activity ?Standing balance-Leahy Scale: Fair ?  ?  ?  ?  ?  ?  ?  ?  ?  ?  ?  ?  ?  ? ?  ?Cognition Arousal/Alertness: Awake/alert ?Behavior During Therapy: [facility] for tasks assessed/performed, Flat affect ?Overall Cognitive Status: Within Functional Limits for tasks assessed ?  ?  ?  ?  ?  ?  ?  ?  ?  ?  ?  ?  ?  ?  ?  ?  ?  General Comments: very pleasant, participatory ?  ?  ? ?  ?Exercises   ? ?  ?General Comments   ?  ?  ? ?Pertinent Vitals/Pain    ? ? ?Home Living   ?  ?  ?  ?  ?  ?  ?  ?  ?  ?   ?  ?Prior Function    ?  ?   ?   ? ?PT Goals (current goals can now be found in the care plan section) Acute Rehab PT Goals ?PT Goal Formulation: With patient ?Time For Goal Achievement: 07/19/21 ? ?  ?Frequency ? ? ? Min 5X/week ? ? ? ?  ?PT Plan Current plan remains appropriate  ? ? ?Co-evaluation   ?  ?  ?  ?  ? ?  ?AM-PAC PT "6 Clicks" Mobility   ?Outcome Measure ? Help needed turning from your back to your side while in a flat bed without using bedrails?: None ?Help needed moving from lying on your back to sitting on the side of a flat bed without using bedrails?: None ?Help needed moving to and from a bed to a chair (including a wheelchair)?: A Little ?Help needed standing up from a chair using your arms (e.g., wheelchair or bedside chair)?: A Little ?Help needed to walk in hospital room?: A Little ?Help needed climbing 3-5 steps with a railing? : A Lot ?6 Click Score: 19 ? ?  ?End of Session Equipment Utilized During Treatment: Gait belt ?Activity Tolerance: Patient tolerated treatment well ?Patient left: in chair;with call bell/phone within reach;with family/visitor present ?Nurse Communication: Mobility status ?PT Visit Diagnosis: Other abnormalities of gait and mobility (R26.89) ?  ? ? ?Time: 2229-7989 ?PT Time Calculation (min) (ACUTE ONLY): 20 min ? ?Charges:  $Gait Training: 8-22 mins          ?          ? ?Audry Riles. PTA ?Acute Rehabilitation Services ?Office: [phone] ? ? ? ?[name] ?07/07/2021, 9:33 AM ? ?

## 2021-07-07 NOTE — Discharge Instructions (Addendum)
Information on my medicine - ELIQUIS? (apixaban) ? ?This medication education was reviewed with me or my healthcare representative as part of my discharge preparation.  The pharmacist that spoke with me during my hospital stay was:  ? ?Why was Eliquis? prescribed for you? ?Eliquis? was prescribed for you to reduce the risk of a blood clot forming that can cause a stroke if you have a medical condition called atrial fibrillation (a type of irregular heartbeat). ? ?What do You need to know about Eliquis? ? ?Take your Eliquis? TWICE DAILY - one tablet in the morning and one tablet in the evening with or without food. If you have difficulty swallowing the tablet whole please discuss with your pharmacist how to take the medication safely. ? ?Take Eliquis? exactly as prescribed by your doctor and DO NOT stop taking Eliquis? without talking to the doctor who prescribed the medication.  Stopping may increase your risk of developing a stroke.  Refill your prescription before you run out. ? ?After discharge, you should have regular check-up appointments with your healthcare provider that is prescribing your Eliquis?.  In the future your dose may need to be changed if your kidney function or weight changes by a significant amount or as you get older. ? ?What do you do if you miss a dose? ?If you miss a dose, take it as soon as you remember on the same day and resume taking twice daily.  Do not take more than one dose of ELIQUIS at the same time to make up a missed dose. ? ?Important Safety Information ?A possible side effect of Eliquis? is bleeding. You should call your healthcare provider right away if you experience any of the following: ?Bleeding from an injury or your nose that does not stop. ?Unusual colored urine (red or dark brown) or unusual colored stools (red or black). ?Unusual bruising for unknown reasons. ?A serious fall or if you hit your head (even if there is no bleeding). ? ?Some medicines may interact with  Eliquis? and might increase your risk of bleeding or clotting while on Eliquis?[name] To help avoid this, consult your healthcare provider or pharmacist prior to using any new prescription or non-prescription medications, including herbals, vitamins, non-steroidal anti-inflammatory drugs (NSAIDs) and supplements. ? ?This website has more information on Eliquis? (apixaban): http://www.eliquis.com/eliquis/home ? ? ? ? ?Orthopaedic Trauma Service Discharge Instructions ? ? ?General Discharge Instructions ? ?Orthopaedic Injuries: ? Left acetabulum fracture treated with open reduction internal fixation using plate and screws ? ?WEIGHT BEARING STATUS: Touchdown weightbearing left leg with assistance ? ?RANGE OF MOTION/ACTIVITY: Posterior hip precautions left hip, activity as tolerated while maintaining weightbearing restrictions ? ?Bone health: Labs show vitamin D deficiency.  Please take vitamin D3 5000 IUs daily ? ?Review the following resource for additional information regarding bone health ? ?asphaltmakina.com ? ?Wound Care: Daily wound care as needed ? ?Discharge Wound Care Instructions ? ?Do NOT apply any ointments, solutions or lotions to pin sites or surgical wounds.  These prevent needed drainage and even though solutions like hydrogen peroxide kill bacteria, they also damage cells lining the pin sites that help fight infection.  Applying lotions or ointments can keep the wounds moist and can cause them to breakdown and open up as well. This can increase the risk for infection. When in doubt call the office. ? ?Surgical incisions should be dressed daily. ? ?If any drainage is noted, use one layer of adaptic or Mepitel, then gauze and tape or you can use a silicone  foam dressing   ? ?PopCommunication.fr ?[email or web address].ee?pd_rd_[email or web address] ? ?CheapWipes.gl ? ?These dressing supplies should be available at local medical supply stores ([facility] [employer], [facility], etc). They are not usually carried at places like [facility], [facility], [employer], etc ? ?Once the incision is completely dry and without drainage, it may be left open to air out.  Showering may begin 36-48 hours later.  Cleaning gently with soap and water. ? ?Diet: as you were eating previously.  Can use over the counter stool softeners and bowel preparations, such as Miralax, to help with bowel movements.  Narcotics can be constipating.  Be sure to drink plenty of fluids ? ?PAIN MEDICATION USE AND EXPECTATIONS ? You have likely been given narcotic medications to help control your pain.  After a traumatic event that results in an fracture (broken bone) with or without surgery, it is ok to use narcotic pain medications to help control one's pain.  We understand that everyone responds to pain differently and each individual patient will be evaluated on a regular basis for the continued need for narcotic medications. Ideally, narcotic medication use should last no more than 6-8 weeks (coinciding with fracture healing).  ? As a patient it is your responsibility as well to monitor narcotic medication use and report the amount and frequency you use these medications when you come to your office visit.  ? We would also advise that if you are using narcotic medications, you should take a dose prior to therapy to maximize you participation. ? ?IF YOU ARE ON NARCOTIC MEDICATIONS IT IS NOT PERMISSIBLE TO OPERATE A MOTOR VEHICLE (MOTORCYCLE/CAR/TRUCK/MOPED) OR HEAVY MACHINERY ?DO NOT MIX NARCOTICS WITH OTHER CNS (CENTRAL NERVOUS SYSTEM)  DEPRESSANTS SUCH AS ALCOHOL ? ? ?POST-OPERATIVE OPIOID TAPER INSTRUCTIONS: ?It is important to wean off of your opioid medication as soon as possible. If you do not need pain medication after your surgery it is ok to stop day one. ?Opioids include: ?Codeine, Hydrocodone(Norco, Vicodin), Oxycodone(Percocet, oxycontin) and hydromorphone amongst others.  ?Long term and even short term use of opiods can cause: ?Increased pain response ?Dependence ?Constipation ?Depression ?Respiratory depression ?And more.  ?Withdrawal symptoms can include ?Flu like symptoms ?Nausea, vomiting ?And more ?Techniques to manage these symptoms ?Hydrate well ?Eat regular healthy meals ?Stay active ?Use relaxation techniques(deep breathing, meditating, yoga) ?Do Not substitute Alcohol to help with tapering ?If you have been on opioids for less than two weeks and do not have pain than it is ok to stop all together.  ?Plan to wean off of opioids ?This plan should start within one week post op of your fracture surgery  ?Maintain the same interval or time between taking each dose and first decrease the dose.  ?Cut the total daily intake of opioids by one tablet each day ?Next start to increase the time between doses. ?The last dose that should be eliminated is the evening dose.  ? ? ?STOP SMOKING OR USING NICOTINE PRODUCTS!!!! ? As discussed nicotine severely impairs your body's ability to heal surgical and traumatic wounds but also impairs bone healing.  Wounds and bone heal by forming microscopic blood vessels (angiogenesis) and nicotine is a vasoconstrictor (essentially, shrinks blood vessels).  Therefore, if vasoconstriction occurs to these microscopic blood vessels they essentially disappear and are unable to deliver necessary nutrients to the healing tissue.  This is one modifiable factor that you can do to dramatically increase your chances of healing your injury.   ? (This means no smoking, no nicotine gum, patches, etc) ? ?DO NOT  USE  NONSTEROIDAL ANTI-INFLAMMATORY DRUGS (NSAID'S) ? Using products such as Advil (ibuprofen), Aleve (naproxen), Motrin (ibuprofen) for additional pain control during fracture healing can delay and/or prevent the healing response.  If you would like to take over the counter (OTC) medication, Tylenol (acetaminophen) is ok.  However, some narcotic medications that are given for pain control contain acetaminophen as well. Therefore, you

## 2021-07-07 NOTE — Progress Notes (Signed)
? ?                              Orthopaedic Trauma Service Progress Note ? ?Patient ID: ?Billy [name] ?MRN: [id number] ?DOB/AGE: [birth year] 56 y.o. ? ?Subjective: ? ?Doing well ?Sitting in chair currently  ?Ambulated a little earlier this am ?Feeling better overall ? ?Home tomorrow  ? ?Total testosterone is low, remainder of panel is in process ? ?ROS ?As above ? ?Objective:  ? ?VITALS:   ?Vitals:  ? 07/06/21 1601 07/06/21 2043 07/07/21 0439 07/07/21 0814  ?BP: 116/70 110/76 122/71 108/69  ?Pulse: 68 70 70 72  ?Resp: '16 16 16 16  '$ ?Temp: 98.6 ?F (37 ?C) 98.7 ?F (37.1 ?C) 98.4 ?F (36.9 ?C) 98 ?F (36.7 ?C)  ?TempSrc: Oral Oral Oral Oral  ?SpO2: 100% 93% 97% 97%  ? ? ?Estimated body mass index is 36.96 kg/m? as calculated from the following: ?  Height as of 06/17/21: '5\' 11"'$  (1.803 m). ?  Weight as of 06/17/21: 120.2 kg. ? ? ?Intake/Output   ?   04/05 0701 ?04/06 0700 04/06 0701 ?04/07 0700  ? P.O. 240   ? Total Intake 240   ? Urine 2450   ? Total Output 2450   ? Net -2210   ?     ? Urine Occurrence 4 x   ?  ? ?LABS ? ?Results for orders placed or performed during the hospital encounter of 07/01/21 (from the past 24 hour(s))  ?Basic metabolic panel     Status: Abnormal  ? Collection Time: 07/07/21 12:57 AM  ?Result Value Ref Range  ? Sodium 136 135 - 145 mmol/L  ? Potassium 3.4 (L) 3.5 - 5.1 mmol/L  ? Chloride 99 98 - 111 mmol/L  ? CO2 30 22 - 32 mmol/L  ? Glucose, Bld 99 70 - 99 mg/dL  ? BUN 13 6 - 20 mg/dL  ? Creatinine, Ser 1.10 0.61 - 1.24 mg/dL  ? Calcium 8.8 (L) 8.9 - 10.3 mg/dL  ? GFR, Estimated >60 >60 mL/min  ? Anion gap 7 5 - 15  ?CBC with Differential/Platelet     Status: Abnormal  ? Collection Time: 07/07/21 12:57 AM  ?Result Value Ref Range  ? WBC 11.0 (H) 4.0 - 10.5 K/uL  ? RBC 4.30 4.22 - 5.81 MIL/uL  ? Hemoglobin 12.1 (L) 13.0 - 17.0 g/dL  ? HCT 35.1 (L) 39.0 - 52.0 %  ? MCV 81.6 80.0 - 100.0 fL  ? MCH 28.1 26.0 - 34.0 pg  ? MCHC 34.5 30.0 - 36.0 g/dL  ? RDW 14.6  11.5 - 15.5 %  ? Platelets 264 150 - 400 K/uL  ? nRBC 0.0 0.0 - 0.2 %  ? Neutrophils Relative % 57 %  ? Neutro Abs 6.3 1.7 - 7.7 K/uL  ? Lymphocytes Relative 24 %  ? Lymphs Abs 2.7 0.7 - 4.0 K/uL  ? Monocytes Relative 14 %  ? Monocytes Absolute 1.6 (H) 0.1 - 1.0 K/uL  ? Eosinophils Relative 3 %  ? Eosinophils Absolute 0.3 0.0 - 0.5 K/uL  ? Basophils Relative 1 %  ? Basophils Absolute 0.1 0.0 - 0.1 K/uL  ? Immature Granulocytes 1 %  ? Abs Immature Granulocytes 0.09 (H) 0.00 - 0.07 K/uL  ? ? ? ?PHYSICAL EXAM:  ? ?Gen: awake and alert, resting comfortably in chair ?Lungs: unlabored ?Ext:  ?     Left Lower extremity  ?  Length and rotation of left leg appear symmetric to contralateral side.  Resting position appears appropriate ?            Dressing left hip is clean, dry and intact ?  Dressing changed ?  Incision is clean and dry  ?  No drainage ?  No erythema  ?  No odor  ?            Extremity is warm ?            + DP pulse ?            DPN, SPN, TN sensory function intact            ?            EHL, FHL, lesser toe motor functions intact.  Ankle flexion, extension, inversion and eversion intact.   ? ?Assessment/Plan: ?3 Days Post-Op  ? ? ? ?Anti-infectives (From admission, onward)  ? ? Start     Dose/Rate Route Frequency Ordered Stop  ? 07/04/21 1800  ceFAZolin (ANCEF) IVPB 2g/100 mL premix       ? 2 g ?200 mL/hr over 30 Minutes Intravenous Every 6 hours 07/04/21 1332 07/05/21 0754  ? 07/04/21 1147  vancomycin (VANCOCIN) powder  Status:  Discontinued       ?   As needed 07/04/21 1150 07/04/21 1233  ? 07/04/21 0800  ceFAZolin (ANCEF) IVPB 3g/100 mL premix       ? 3 g ?200 mL/hr over 30 Minutes Intravenous On call to O.R. 07/03/21 1548 07/04/21 0952  ? ?  ?. ? ?POD/HD#: 71 ? ?56 year old male with complex cardiac history a ground-level fall with comminuted left acetabulum fracture ?  ?-Comminuted left acetabulum fracture s/p ORIF ?            Touchdown weightbearing left leg with assistance ?             Posterior hip precautions left hip ?            PT and OT evaluations ?            Ice as needed ?            Dressing changed today  ?  Change as needed---> mepilex or 4x4's and medipore/hypafix tape  ?  Can be left open to air  ?  Ok to shower and clean wound with soap and water only  ?             ?            XRT completed for HO prophylaxis  ?  ?- Pain management: ?            Multimodal ?            Burtis Junes orals over IV ?  ?- ABL anemia/Hemodynamics ?            Monitor             ?            stable  ?  ?- Medical issues  ?            Per hospitalist and cardiology ?  ?- DVT/PE prophylaxis: ?            eliquis and plavix restarted  ?  ?- ID:  ?            Perioperative antibiotics ?  ?- Metabolic Bone Disease: ?  Vitamin D deficiency ?                        Supplement ?            Very unusual fracture for someone at his age given mechanism. ?            total T is low ?  Awaiting remainder of panel to return  ?  Would repeat in 12 weeks and if low would discuss with PCP if they would like to pursue TRT  ?   Data still somewhat unclear with respect to TRT and CV disease risk.  Would need to be discussions had with PCP and cards  ? Recommend DEXA as well given hypogonadism  ?  ?- Activity: ?            As above ?  ?- Impediments to fracture healing: ?            Poor bone quality ?            Vitamin D deficiency ? ?- Dispo: ?           continue with therapies  ?            Ortho issues stable  ?            Home tomorrow ? Follow up with ortho in 10 days  ? ? ? ?Jari Pigg, PA-C ?[phone] (C) ?07/07/2021, 9:28 AM ? ?[facility] ?[place] [address] ?[phone] Jenetta Downer) ?[phone] (F) ? ? ? ?After 5pm and on the weekends please log on to Amion, go to orthopaedics and the look under the [facility] Call for the provider(s) on call. You can also call our office at [phone] and then follow the prompts to be connected to the call team.  ? Patient  ID: Billy [name], male   DOB: 07/28/1965, 56 y.o.   MRN: [id number] ? ?

## 2021-07-07 NOTE — Care Management Important Message (Signed)
Important Message ? ?Patient Details  ?Name: Billy [name] ?MRN: [id number] ?Date of Birth: [birth year] ? ? ?Medicare Important Message Given:  Yes ? ? ? ? ?Levada Dy  Latrel Szymczak-Martin ?07/07/2021, 1:27 PM ?

## 2021-07-07 NOTE — Progress Notes (Signed)
Occupational Therapy Treatment ?Patient Details ?Name: Billy [name] ?MRN: [id number] ?DOB: 06-17-1965 ?Today's Date: 07/07/2021 ? ? ?History of present illness Pt is a 56 y.o. male admitted 07/01/21 after fall at home sustaining L hip acetabular fx dislocation. S/p closed reduction L hip fx dislocation with skeletal traction placement 4/1. S/p L acetabular ORIF 4/3. Oncology consulted for postoperative radiation tx for prevention of heterotopic ossification postoperatively. PMH includes HF, CAD, PAF, ICD placement, PAD, CKD, obesity. ?  ?OT comments ? Pt making excellent progress towards OT goals. Session focused on reinforcement of WB/hip precautions, ADL mobility, standing balance with ADLs and fall prevention strategies for daily task mgmt at home. Due to precautions, pt continues to require Min A for LB ADLs without use of AE though pt's wife can assist as needed at home. Pt ready to DC home once medically cleared.   ? ?Recommendations for follow up therapy are one component of a multi-disciplinary discharge planning process, led by the attending physician.  Recommendations may be updated based on patient status, additional functional criteria and insurance authorization. ?   ?Follow Up Recommendations ? Home health OT  ?  ?Assistance Recommended at Discharge Set up Supervision/Assistance  ?Patient can return home with the following ? A little help with bathing/dressing/bathroom;Assistance with cooking/housework;Help with stairs or ramp for entrance;Assist for transportation ?  ?Equipment Recommendations ? None recommended by OT  ?  ?Recommendations for Other Services   ? ?  ?Precautions / Restrictions Precautions ?Precautions: Posterior Hip;Fall ?Precaution Booklet Issued: Yes (comment) ?Precaution Comments: pt recalled TDWB and 2/3 posterior hip prec ?Restrictions ?Weight Bearing Restrictions: Yes ?LLE Weight Bearing: Touchdown weight bearing  ? ? ?  ? ?Mobility Bed Mobility ?  ?  ?  ?  ?  ?  ?  ?General bed  mobility comments: up in recliner ?  ? ?Transfers ?Overall transfer level: Needs assistance ?Equipment used: Rolling walker (2 wheels) ?Transfers: Sit to/from Stand ?Sit to Stand: Supervision ?  ?  ?  ?  ?  ?General transfer comment: no assist needed to stand ?  ?  ?Balance Overall balance assessment: Needs assistance ?Sitting-balance support: No upper extremity supported ?Sitting balance-Leahy Scale: Good ?  ?  ?Standing balance support: Single extremity supported, No upper extremity supported, During functional activity ?Standing balance-Leahy Scale: Fair ?Standing balance comment: able to static stand at sink to brush teeth without UE support,intermittent cues for TDWB precautions; BUE support for mobility d/t WB precautions ?  ?  ?  ?  ?  ?  ?  ?  ?  ?  ?  ?   ? ?ADL either performed or assessed with clinical judgement  ? ?ADL Overall ADL's : Needs assistance/impaired ?  ?  ?Grooming: Set up;Standing;Oral care ?Grooming Details (indicate cue type and reason): minor reminers for WB precautions standing at sink but pt reports placing majority of weight through R LE ?  ?  ?  ?  ?  ?  ?Lower Body Dressing: Minimal assistance;Sit to/from stand ?Lower Body Dressing Details (indicate cue type and reason): did asssist with socks; educated on use of AE or family assist, as well as easier clothing to manage at home. Pt's wife present and endorses ability to help ?  ?  ?  ?  ?  ?  ?Functional mobility during ADLs: Min guard;Rolling walker (2 wheels) ?General ADL Comments: reinforced fall prevention, use of shower chair and initial assist with this at home, use of bag on RW to carry items. Pt with  some difficulty lifting LE to move over - provided gait belt with loop for leg lifter - advised to avoid heavy pulling or lifitng LE heavily with this for WB precaution maintainence ?  ? ?Extremity/Trunk Assessment Upper Extremity Assessment ?Upper Extremity Assessment: Overall WFL for tasks assessed ?  ?Lower Extremity  Assessment ?Lower Extremity Assessment: Defer to PT evaluation ?  ?  ?  ? ?Vision   ?Vision Assessment?: No apparent visual deficits ?  ?Perception   ?  ?Praxis   ?  ? ?Cognition Arousal/Alertness: Awake/alert ?Behavior During Therapy: [facility] for tasks assessed/performed, Flat affect ?Overall Cognitive Status: Within Functional Limits for tasks assessed ?  ?  ?  ?  ?  ?  ?  ?  ?  ?  ?  ?  ?  ?  ?  ?  ?General Comments: very pleasant, participatory ?  ?  ?   ?Exercises   ? ?  ?Shoulder Instructions   ? ? ?  ?General Comments    ? ? ?Pertinent Vitals/ Pain       Pain Assessment ?Pain Assessment: No/denies pain ? ?Home Living   ?  ?  ?  ?  ?  ?  ?  ?  ?  ?  ?  ?  ?  ?  ?  ?  ?  ?  ? ?  ?Prior Functioning/Environment    ?  ?  ?  ?   ? ?Frequency ? Min 2X/week  ? ? ? ? ?  ?Progress Toward Goals ? ?OT Goals(current goals can now be found in the care plan section) ? Progress towards OT goals: Progressing toward goals ? ?Acute Rehab OT Goals ?Patient Stated Goal: recover strength back in L LE ?OT Goal Formulation: With patient ?Time For Goal Achievement: 07/19/21 ?Potential to Achieve Goals: Good ?ADL Goals ?Pt Will Perform Upper Body Bathing: with supervision;with set-up;standing;sitting ?Pt Will Perform Lower Body Bathing: with set-up;with supervision;sit to/from stand;with adaptive equipment ?Pt Will Perform Upper Body Dressing: with set-up;with supervision;sitting ?Pt Will Perform Lower Body Dressing: with set-up;with supervision;sit to/from stand;with adaptive equipment ?Pt Will Transfer to Toilet: with supervision;ambulating ?Pt Will Perform Toileting - Clothing Manipulation and hygiene: with supervision;sit to/from stand ?Pt Will Perform Tub/Shower Transfer: with min guard assist;rolling walker;ambulating;shower seat;Shower transfer  ?Plan Discharge plan remains appropriate   ? ?Co-evaluation ? ? ?   ?  ?  ?  ?  ? ?  ?AM-PAC OT "6 Clicks" Daily Activity     ?Outcome Measure ? ? Help from another person eating meals?:  None ?Help from another person taking care of personal grooming?: A Little ?Help from another person toileting, which includes using toliet, bedpan, or urinal?: A Little ?Help from another person bathing (including washing, rinsing, drying)?: A Little ?Help from another person to put on and taking off regular upper body clothing?: A Little ?Help from another person to put on and taking off regular lower body clothing?: A Little ?6 Click Score: 19 ? ?  ?End of Session Equipment Utilized During Treatment: Rolling walker (2 wheels) ? ?OT Visit Diagnosis: Unsteadiness on feet (R26.81);Muscle weakness (generalized) (M62.81);History of falling (Z91.81) ?  ?Activity Tolerance Patient tolerated treatment well ?  ?Patient Left in chair;with call bell/phone within reach;with family/visitor present ?  ?Nurse Communication Mobility status (Nursing student present) ?  ? ?   ? ?Time: 9562-1308 ?OT Time Calculation (min): 18 min ? ?Charges: OT General Charges ?$OT Visit: 1 Visit ?OT Treatments ?$Self Care/Home Management : 8-22  mins ? ?Malachy Chamber, OTR/L ?Acute Rehab Services ?Office: [phone]  ? ?Layla Maw ?07/07/2021, 8:58 AM ?

## 2021-07-08 LAB — BASIC METABOLIC PANEL
Anion gap: 4 — ABNORMAL LOW (ref 5–15)
BUN: 9 mg/dL (ref 6–20)
CO2: 32 mmol/L (ref 22–32)
Calcium: 8.6 mg/dL — ABNORMAL LOW (ref 8.9–10.3)
Chloride: 100 mmol/L (ref 98–111)
Creatinine, Ser: 1.15 mg/dL (ref 0.61–1.24)
GFR, Estimated: >60 mL/min (ref >60)
Glucose, Bld: 110 mg/dL — ABNORMAL HIGH (ref 70–99)
Potassium: 4.1 mmol/L (ref 3.5–5.1)
Sodium: 136 mmol/L (ref 135–145)

## 2021-07-08 MED ORDER — VITAMIN D (ERGOCALCIFEROL) 1.25 MG (50000 UNIT) PO CAPS: 50000.0000 [IU] | ORAL_CAPSULE | ORAL | 0 refills | Status: DC

## 2021-07-08 MED ORDER — OXYCODONE HCL 5 MG PO TABS
5.0000 mg | ORAL_TABLET | ORAL | 0 refills | Status: DC | PRN
Start: 2021-07-08 — End: 2021-08-17

## 2021-07-08 MED ORDER — DOCUSATE SODIUM 100 MG PO CAPS: 100.0000 mg | ORAL_CAPSULE | Freq: Two times a day (BID) | ORAL | 0 refills | Status: AC

## 2021-07-08 MED ORDER — VITAMIN D3 25 MCG PO TABS: 2000.0000 [IU] | ORAL_TABLET | Freq: Two times a day (BID) | ORAL | 3 refills | Status: DC

## 2021-07-08 MED ORDER — ISOSORBIDE MONONITRATE ER 30 MG PO TB24: 15.0000 mg | ORAL_TABLET | Freq: Every day | ORAL | 0 refills | Status: DC

## 2021-07-08 MED ORDER — POLYETHYLENE GLYCOL 3350 17 G PO PACK
17.0000 g | PACK | Freq: Every day | ORAL | 0 refills | Status: DC
Start: 2021-07-09 — End: 2023-12-25

## 2021-07-08 MED ORDER — CARVEDILOL 3.125 MG PO TABS: 3.1250 mg | ORAL_TABLET | Freq: Two times a day (BID) | ORAL | 0 refills | Status: DC

## 2021-07-08 MED ORDER — METHOCARBAMOL 1000 MG PO TABS: 1000.0000 mg | ORAL_TABLET | Freq: Three times a day (TID) | ORAL | 0 refills | Status: DC | PRN

## 2021-07-08 NOTE — Progress Notes (Signed)
Dc instructions given by primary RN Brittney. Patient transferred to [employer], awaiting ride. Toc meds and belongings and equipments with patient.  ?

## 2021-07-08 NOTE — Discharge Summary (Signed)
Physician Discharge Summary  ?Billy [name] QMG:[id number] DOB: [birth year] DOA: 07/01/2021 ? ?PCP: [name], MD ? ?Admit date: 07/01/2021 ?Discharge date: 07/08/2021 ? ?Admitted From: Home ?Disposition:  Home ? ?Discharge Condition:Stable ?CODE STATUS:FULL ?Diet recommendation: Heart Healthy  ? ?Brief/Interim Summary: ? ?Patient is a 56 year old male with history of V-fib cardiac arrest status post ICD placement in 2010, chronic heart failure with reduced ejection fraction, coronary artery disease status post CABG, paroxysmal A-fib status post ablation 2022 who presented to the ED from home after slipping in the wet grass resulting in immediate left hip pain.  X-ray confirmed dislocated left hip with, comminuted acetabular  fracture .Orthopedics consulted, underwent ORIF on 4/3.  He also underwent postoperative radiation to prevent heterotopic ossification.  PT/OT recommending home health on DC.  He will follow-up with orthopedics as an outpatient. ? ?Following problems were addressed during his hospitalization: ? ?Closed left acetabular fracture: Status post ORIF on 4/3.  Underwent radiation therapy to reduce hypertrophic ossification.  PT/OT recommend home health on discharge per orthopedics following.  Very atypical to have fracture at this agein a male.  Osteoporosis suspected.  Total testosterone level low, free testosterone level pending.  We recommend to do a bone density scan as an outpatient and follow-up with outpatient osteoporosis clinic/endocrinology. ?  ?Stage IIIa CKD: Baseline creatinine ranged from 1.3-1.6.  Currently kidney function at baseline ?  ?Coronary artery disease: Status post quadruple vessel bypass.  No anginal symptoms.  On beta-blocker, Imdur, statin, Zetia,  Plavix ?  ?History of A-fib: Status post ablation, status post implantable cardioverter-defibrillator placement.  Currently in normal sinus rhythm.  On Eliquis for anticoagulation.  On Coreg for rate control ?   ?Hypertension: Currently blood pressure soft.  Dose of Imdur and carvedilol reduced ?  ?History of systolic congestive heart failure: Currently following.  On Lasix, Entresto at home. ?  ?Hypothyroidism: Continue Synthyroid ?  ?Obesity: BMI 36.9 ? ?Discharge Diagnoses:  ?Principal Problem: ?  Closed left acetabular fracture ([name]) ?Active Problems: ?  Dislocated hip, left, initial encounter ([name]) ?  Essential hypertension ?  Implantable cardioverter-defibrillator (ICD) in situ ?  GERD (gastroesophageal reflux disease) ?  S/P quadruple vessel bypass ?  Morbid obesity ([name]) ?  Stage 3a chronic kidney disease (CKD) ([name]) - baseline SCr 1.3-1.6 ?  Hypothyroidism ?  Chronic HFrEF (heart failure with reduced ejection fraction) ([name]) ?  Vitamin D deficiency ?  Pathologic fracture of left acetabulum ? ? ? ?Discharge Instructions ? ?Discharge Instructions   ? ? Diet - low sodium heart healthy   Complete by: As directed ?  ? Discharge instructions   Complete by: As directed ?  ? 1)Please take prescribed medications as instructed ?2)Follow up with orthopedics in 10 days.  Name and number the provider has been attached ?3)Follow up with home health ?4)Follow up with endocrinology as an outpatient regarding your low testosterone level and possible osteoporosis.  Name and number of the provider has been attached.  Please call for appointment in 4 weeks ?5)We have decreased the doses of your blood pressure medications.  Monitor blood pressure at home  ? Increase activity slowly   Complete by: As directed ?  ? No wound care   Complete by: As directed ?  ? ?  ? ?Allergies as of 07/08/2021   ?No Known Allergies ?  ? ?  ?Medication List  ?  ? ?STOP taking these medications   ? ?amiodarone 200 MG tablet ?Commonly known as: Pacerone ?  ? ?  ? ?  TAKE these medications   ? ?atorvastatin 80 MG tablet ?Commonly known as: LIPITOR ?TAKE 1 TABLET EVERY DAY ?  ?carvedilol 3.125 MG tablet ?Commonly known as: COREG ?Take 1 tablet (3.125 mg total) by  mouth 2 (two) times daily with a meal. ?What changed:  ?medication strength ?See the new instructions. ?  ?clopidogrel 75 MG tablet ?Commonly known as: PLAVIX ?TAKE 1 TABLET EVERY DAY ?  ?docusate sodium 100 MG capsule ?Commonly known as: COLACE ?Take 1 capsule (100 mg total) by mouth 2 (two) times daily for 14 days. ?  ?Eliquis 5 MG Tabs tablet ?Generic drug: apixaban ?TAKE 1 TABLET (5 MG TOTAL) BY MOUTH 2 (TWO) TIMES DAILY. ?  ?Entresto 24-26 MG ?Generic drug: sacubitril-valsartan ?TAKE 1 TABLET TWICE DAILY ?  ?ezetimibe 10 MG tablet ?Commonly known as: ZETIA ?Take 1 tablet (10 mg total) by mouth daily. ?  ?fluticasone 50 MCG/ACT nasal spray ?Commonly known as: Flonase ?Place 2 sprays into both nostrils daily as needed for allergies or rhinitis. ?What changed: when to take this ?  ?furosemide 40 MG tablet ?Commonly known as: LASIX ?TAKE 1 TABLET EVERY DAY ?  ?isosorbide mononitrate 30 MG 24 hr tablet ?Commonly known as: IMDUR ?Take 0.5 tablets (15 mg total) by mouth daily. ?Start taking on: July 09, 2021 ?What changed: how much to take ?  ?levothyroxine 50 MCG tablet ?Commonly known as: SYNTHROID ?Take 1 tablet (50 mcg total) by mouth daily before breakfast. 30 minutes before having breakfast. ?  ?Methocarbamol 1000 MG Tabs ?Take 1,000 mg by mouth every 8 (eight) hours as needed for muscle spasms. ?  ?montelukast 10 MG tablet ?Commonly known as: SINGULAIR ?TAKE 1 TABLET EVERY DAY AS NEEDED FOR ALLERGIES ?What changed: See the new instructions. ?  ?multivitamin with minerals Tabs tablet ?Take 1 tablet by mouth daily. ?  ?nitroGLYCERIN 0.4 MG SL tablet ?Commonly known as: NITROSTAT ?DISSOLVE 1 TABLET UNDER THE TONGUE EVERY 5 MINUTES FOR 3 DOSES AS NEEDED FOR CHEST PAIN ?What changed: See the new instructions. ?  ?oxyCODONE 5 MG immediate release tablet ?Commonly known as: Oxy IR/ROXICODONE ?Take 1 tablet (5 mg total) by mouth every 4 (four) hours as needed for moderate pain or severe pain. ?  ?pantoprazole 40 MG  tablet ?Commonly known as: PROTONIX ?TAKE 1 TABLET EVERY DAY ?  ?polyethylene glycol 17 g packet ?Commonly known as: MIRALAX / GLYCOLAX ?Take 17 g by mouth daily. ?Start taking on: July 09, 2021 ?  ?potassium chloride SA 20 MEQ tablet ?Commonly known as: KLOR-CON M ?TAKE 1 TABLET EVERY DAY ?  ?Vitamin D (Ergocalciferol) 1.25 MG (50000 UNIT) Caps capsule ?Commonly known as: DRISDOL ?Take 1 capsule (50,000 Units total) by mouth every 7 (seven) days. ?Start taking on: July 11, 2021 ?  ?Vitamin D3 25 MCG tablet ?Commonly known as: Vitamin D ?Take 2 tablets (2,000 Units total) by mouth 2 (two) times daily. ?  ? ?  ? ?  ?  ? ? ?  ?Durable Medical Equipment  ?(From admission, onward)  ?  ? ? ?  ? ?  Start     Ordered  ? 07/06/21 1455  For home use only DME Walker rolling  Once       ?Comments: Bariatric  ?Question Answer Comment  ?Walker: With 5 Inch Wheels   ?Patient needs a walker to treat with the following condition Weakness   ?  ? 07/06/21 1455  ? 07/06/21 1454  For home use only DME 3 n 1  Once       ?  07/06/21 1454  ? 07/06/21 1307  For home use only DME Other see comment  Once       ?Comments: Lift recliner  ?Question:  Length of Need  Answer:  Lifetime  ? 07/06/21 1307  ? ?  ?  ? ?  ? ? Follow-up Information   ? ? Care, [facility] Follow up.   ?Specialty: Home Health Services ?Contact information: ?[address] ?[address] ?[place] [address] ?[phone] ? ? ?  ?  ? ? Altamese Passaic, MD. Schedule an appointment as soon as possible for a visit in 10 day(s).   ?Specialty: Orthopedic Surgery ?Contact information: ?[place] [address] ?[phone] ? ? ?  ?  ? ? Delrae Rend, MD. Schedule an appointment as soon as possible for a visit in 4 week(s).   ?Specialty: Endocrinology ?Contact information: ?[address] ?[address] ?[place] [address] ?[phone] ? ? ?  ?  ? ?  ?  ? ?  ? ?No Known Allergies ? ?Consultations: ?Orthopedics ? ? ?Procedures/Studies: ?DG Chest 1  View ? ?Result Date: 07/01/2021 ?CLINICAL DATA:  Hip fracture.  Fall. EXAM: CHEST  1 VIEW COMPARISON:  05/10/2020 FINDINGS: Cardiac pacemaker. Postoperative changes in the mediastinum. Cardiac enlargement. No vascular conges

## 2021-07-08 NOTE — Progress Notes (Addendum)
Physical Therapy Treatment ?Patient Details ?Name: Billy [name] ?MRN: [id number] ?DOB: 16-Sep-1965 ?Today's Date: 07/08/2021 ? ? ?History of Present Illness Pt is a 56 y.o. male admitted 07/01/21 after fall at home sustaining L hip acetabular fx dislocation. S/p closed reduction L hip fx dislocation with skeletal traction placement 4/1. S/p L acetabular ORIF 4/3. Oncology consulted for postoperative radiation tx for prevention of heterotopic ossification postoperatively. PMH includes HF, CAD, PAF, ICD placement, PAD, CKD, obesity. ? ?  ?PT Comments  ? ? Patient is progressing towards goals. Pt able to teach back his precautions to SPT with no difficulty. SPT demonstrated and educated pt on performing car transfers without breaking pt precautions. Pt verbalized his understanding and declined wanting to practice the mobility task. Pt educated on HEP to perform at home to decrease stiffness and increase pain modulation. He was provided with handout. Pt verbalized understanding with all education provided. Pt will continue to follow acutely to progress mobility and maximize pt's safety and independence. ?   ?Recommendations for follow up therapy are one component of a multi-disciplinary discharge planning process, led by the attending physician.  Recommendations may be updated based on patient status, additional functional criteria and insurance authorization. ? ?Follow Up Recommendations ? Home health PT ?  ?  ?Assistance Recommended at Discharge Intermittent Supervision/Assistance  ?Patient can return home with the following A little help with walking and/or transfers;A little help with bathing/dressing/bathroom;Assistance with cooking/housework;Assist for transportation;Help with stairs or ramp for entrance ?  ?Equipment Recommendations ? Rolling walker (2 wheels)  ?  ?Recommendations for Other Services   ? ? ?  ?Precautions / Restrictions Precautions ?Precautions: Posterior Hip;Fall ?Precaution Booklet Issued:  No ?Precaution Comments: pt recalled TDWB and posterior hip prec. Able to teach back precautions to SPT. ?Restrictions ?Weight Bearing Restrictions: Yes ?LLE Weight Bearing: Touchdown weight bearing  ?  ? ?Mobility ? Bed Mobility ?  ?  ?  ?  ?  ?  ?  ?General bed mobility comments: OOB upon entry in recliner ?  ? ?Transfers ?  ?  ?  ?  ?  ?  ?  ?  ?  ?General transfer comment: SPT demonstrated and educated pt and his wife on transfers into/out of a car while maintaining precautions. Pt declined wanting to practice and reports "I got it and think I'll be fine". ?  ? ?Ambulation/Gait ?  ?  ?  ?  ?  ?  ?  ?  ? ? ?Stairs ?  ?  ?  ?  ?  ? ? ?Wheelchair Mobility ?  ? ?Modified Rankin (Stroke Patients Only) ?  ? ? ?  ?Balance   ?  ?  ?  ?  ?  ?  ?  ?  ?  ?  ?  ?  ?  ?  ?  ?  ?  ?  ?  ? ?  ?Cognition Arousal/Alertness: Awake/alert ?Behavior During Therapy: [facility] for tasks assessed/performed, Flat affect ?Overall Cognitive Status: Within Functional Limits for tasks assessed ?  ?  ?  ?  ?  ?  ?  ?  ?  ?  ?  ?  ?  ?  ?  ?  ?General Comments: very pleasant, participatory ?  ?  ? ?  ?Exercises General Exercises - Lower Extremity ?Ankle Circles/Pumps: 5 reps, Both, Seated ?Quad Sets: 10 reps, Left, Seated ?Gluteal Sets: Seated, 10 reps ?Heel Slides: Seated, 10 reps, Left, AROM (cues to limit hip flexion to <90  degrees) ? ?  ?General Comments General comments (skin integrity, edema, etc.): educated on posterior hip precautions and TDWB precautions, car transfers, and hourly short walks around house to decrease excessive stiffness. Pt feels comfortable returning home, navigating stairs, performing needed transfers, and performing all ADLs with wife's assist as needed. Pt given HEP handout of exercises performed today during session. ?  ?  ? ?Pertinent Vitals/Pain Pain Assessment ?Pain Assessment: 0-10 ?Pain Score: 5  ?Pain Location: LLE ?Pain Descriptors / Indicators: Discomfort ?Pain Intervention(s): Monitored during session   ? ? ?Home Living   ?  ?  ?  ?  ?  ?  ?  ?  ?  ?   ?  ?Prior Function    ?  ?  ?   ? ?PT Goals (current goals can now be found in the care plan section) Acute Rehab PT Goals ?Patient Stated Goal: return home ?PT Goal Formulation: With patient/family (pt's wife) ?Time For Goal Achievement: 07/19/21 ?Potential to Achieve Goals: Good ?Progress towards PT goals: Progressing toward goals ? ?  ?Frequency ? ? ? Min 5X/week ? ? ? ?  ?PT Plan Current plan remains appropriate  ? ? ?Co-evaluation   ?  ?  ?  ?  ? ?  ?AM-PAC PT "6 Clicks" Mobility   ?Outcome Measure ? Help needed turning from your back to your side while in a flat bed without using bedrails?: None ?Help needed moving from lying on your back to sitting on the side of a flat bed without using bedrails?: None ?Help needed moving to and from a bed to a chair (including a wheelchair)?: A Little ?Help needed standing up from a chair using your arms (e.g., wheelchair or bedside chair)?: A Little ?Help needed to walk in hospital room?: A Little ?Help needed climbing 3-5 steps with a railing? : A Lot ?6 Click Score: 19 ? ?  ?End of Session   ?Activity Tolerance: Patient tolerated treatment well ?Patient left: in chair;with call bell/phone within reach;with family/visitor present ?Nurse Communication: Mobility status ?PT Visit Diagnosis: Other abnormalities of gait and mobility (R26.89) ?  ? ? ?Time: 1410-3013 ?PT Time Calculation (min) (ACUTE ONLY): 18 min ? ?Charges:  $Therapeutic Exercise: 8-22 mins          ?          ? ?Jonne Ply, SPT ? ?Jonne Ply ?07/08/2021, 12:41 PM ? ?

## 2021-07-08 NOTE — TOC Progression Note (Addendum)
Transition of Care (TOC) - Progression Note  ? ? ?Patient Details  ?Name: Billy [name] ?MRN: [id number] ?Date of Birth: Aug 07, 1965 ? ?Transition of Care (TOC) CM/SW Contact  ?Marilu Favre, RN ?Phone Number: ?07/08/2021, 10:25 AM ? ?Clinical Narrative:    ? ?Tommi Rumps with [facility]  aware of discharge today  ? ?Expected Discharge Plan: [place] ?  ? ?Expected Discharge Plan and Services ?Expected Discharge Plan: [place] ?  ?Discharge Planning Services: CM Consult ?Post Acute Care Choice: Home Health ?  ?Expected Discharge Date: 07/08/21               ?  ?  ?  ?  ?  ?  ?  ?  ?  ?  ? ? ?Social Determinants of Health (SDOH) Interventions ?  ? ?Readmission Risk Interventions ?   ? View : No data to display.  ?  ?  ?  ? ? ?

## 2021-07-08 NOTE — Progress Notes (Signed)
Patient discharged to home at 1200 pm. Discharge summary reviewed with patient and family. All questions answered. Patient denies and further questions or concerns at time of discharge.  ?

## 2021-07-09 ENCOUNTER — Other Ambulatory Visit (HOSPITAL_COMMUNITY): Payer: Self-pay | Admitting: Physician Assistant

## 2021-07-09 DIAGNOSIS — S32412D Displaced fracture of anterior wall of left acetabulum, subsequent encounter for fracture with routine healing: Secondary | ICD-10-CM | POA: Diagnosis not present

## 2021-07-09 DIAGNOSIS — S32422D Displaced fracture of posterior wall of left acetabulum, subsequent encounter for fracture with routine healing: Secondary | ICD-10-CM | POA: Diagnosis not present

## 2021-07-09 DIAGNOSIS — S32402A Unspecified fracture of left acetabulum, initial encounter for closed fracture: Secondary | ICD-10-CM

## 2021-07-09 LAB — TESTOSTERONE, FREE: Testosterone, Free: 1.9 pg/mL — ABNORMAL LOW (ref 7.2–24.0)

## 2021-07-09 MED ORDER — HYDROMORPHONE HCL 2 MG PO TABS: 2.0000 mg | ORAL_TABLET | Freq: Four times a day (QID) | ORAL | 0 refills | Status: AC | PRN

## 2021-07-09 MED ORDER — GABAPENTIN 100 MG PO CAPS: 100.0000 mg | ORAL_CAPSULE | Freq: Three times a day (TID) | ORAL | 0 refills | Status: AC

## 2021-07-09 NOTE — Progress Notes (Signed)
Patient had a ORIF left acetabulum fracture on 07/04/21 with Dr. Marcelino Scot. He was discharged home yesterday.  ? ?Patient's wife called stated pain was still have pain despite taking oxycodone 5 mg one tablet every 4 hours. She has been giving him Tylenol 1000 mg every 8 hours and Robaxin as well. No new injury. Pain only in the hip. No lower leg swelling, no pain in the lower leg. No chest pain or shortness of breath.  ? ?Discussed with his wife I will send him a temporary prescription for Dilaudid. Discussed the pharmacy may not fill the prescription as he just received the oxycodone yesterday. Will also send Gabapentin to help with his pain.  ? ?If pain continues to be uncontrolled or patient begins to develop any concerning symptoms, seek immediate care. They will call with any questions or concerns.  ? ?Noemi Chapel, PA-C ?07/09/2021 ?

## 2021-07-12 LAB — TESTOSTERONE, % FREE: Testosterone-% Free: 1.6 % — ABNORMAL HIGH (ref 0.2–0.7)

## 2021-07-12 NOTE — Progress Notes (Signed)
Remote ICD transmission.   

## 2021-07-18 NOTE — Progress Notes (Signed)
?  Radiation Oncology         [phone] ?________________________________ ? ?Name: Billy [name] MRN: [id number]  ?Date: 07/05/2021  DOB: [birth year] ? ?End of Treatment Note ? ?Diagnosis:   Prevention of Postoperative heterotopic ossification    ? ?Indication for treatment:  curative      ? ?Radiation treatment dates:   07/05/21 ? ?Site/dose:   The patient was treated to a dose of 7 Gy to the left hip. This was accomplished with AP and PA fields. ? ?Narrative: The patient tolerated radiation treatment relatively well.    ? ?Plan: The patient has completed radiation treatment. The patient will return to radiation oncology clinic on an as needed basis. ?________________________________ ? ? ? ? ?Carola Rhine, PAC  ? ? ? ?

## 2021-07-20 DIAGNOSIS — S32422D Displaced fracture of posterior wall of left acetabulum, subsequent encounter for fracture with routine healing: Secondary | ICD-10-CM | POA: Diagnosis not present

## 2021-07-20 DIAGNOSIS — S73015D Posterior dislocation of left hip, subsequent encounter: Secondary | ICD-10-CM | POA: Diagnosis not present

## 2021-08-01 ENCOUNTER — Ambulatory Visit (INDEPENDENT_AMBULATORY_CARE_PROVIDER_SITE_OTHER): Payer: Medicare HMO

## 2021-08-01 DIAGNOSIS — I255 Ischemic cardiomyopathy: Secondary | ICD-10-CM

## 2021-08-02 LAB — CUP PACEART REMOTE DEVICE CHECK
Battery Remaining Longevity: 122 mo
Battery Remaining Percentage: 95.5 %
Battery Voltage: 3.16 V
Brady Statistic RV Percent Paced: 1 %
Date Time Interrogation Session: 20230501201753
HighPow Impedance: 49 Ohm
Implantable Lead Implant Date: 20101111
Implantable Lead Location: 753860
Implantable Lead Model: 7121
Implantable Pulse Generator Implant Date: 20230317
Lead Channel Impedance Value: 500 Ohm
Lead Channel Pacing Threshold Amplitude: 1 V
Lead Channel Pacing Threshold Pulse Width: 0.5 ms
Lead Channel Sensing Intrinsic Amplitude: 12 mV
Lead Channel Setting Pacing Amplitude: 2.5 V
Lead Channel Setting Pacing Pulse Width: 0.5 ms
Lead Channel Setting Sensing Sensitivity: 0.5 mV
Pulse Gen Serial Number: 210001302

## 2021-08-08 ENCOUNTER — Other Ambulatory Visit: Payer: Self-pay | Admitting: Cardiology

## 2021-08-08 ENCOUNTER — Other Ambulatory Visit: Payer: Self-pay

## 2021-08-08 MED ORDER — CARVEDILOL 3.125 MG PO TABS
3.1250 mg | ORAL_TABLET | Freq: Two times a day (BID) | ORAL | 1 refills | Status: DC
Start: 2021-08-08 — End: 2022-02-13

## 2021-08-10 DIAGNOSIS — S32422D Displaced fracture of posterior wall of left acetabulum, subsequent encounter for fracture with routine healing: Secondary | ICD-10-CM | POA: Diagnosis not present

## 2021-08-10 DIAGNOSIS — S73015D Posterior dislocation of left hip, subsequent encounter: Secondary | ICD-10-CM | POA: Diagnosis not present

## 2021-08-11 ENCOUNTER — Telehealth: Payer: Self-pay | Admitting: Family Medicine

## 2021-08-11 NOTE — Telephone Encounter (Signed)
Pts wife called to request a referral to [facility] Endocrinology.  ?

## 2021-08-11 NOTE — Telephone Encounter (Signed)
Called patients wife regarding request fro referral per wife they would like referral for concerns about low testosterone levels. Appointment scheduled for evaluation with PCP ?

## 2021-08-15 ENCOUNTER — Other Ambulatory Visit: Payer: Self-pay | Admitting: Cardiology

## 2021-08-15 NOTE — Progress Notes (Signed)
Remote ICD transmission.   

## 2021-08-15 NOTE — Addendum Note (Signed)
Addended by: Douglass Rivers D on: 08/15/2021 11:08 AM ? ? Modules accepted: Level of Service ? ?

## 2021-08-16 DIAGNOSIS — I471 Supraventricular tachycardia: Secondary | ICD-10-CM | POA: Insufficient documentation

## 2021-08-16 DIAGNOSIS — I4901 Ventricular fibrillation: Secondary | ICD-10-CM | POA: Diagnosis not present

## 2021-08-16 DIAGNOSIS — M25552 Pain in left hip: Secondary | ICD-10-CM | POA: Diagnosis not present

## 2021-08-16 DIAGNOSIS — Z7901 Long term (current) use of anticoagulants: Secondary | ICD-10-CM | POA: Diagnosis not present

## 2021-08-16 DIAGNOSIS — E559 Vitamin D deficiency, unspecified: Secondary | ICD-10-CM | POA: Diagnosis not present

## 2021-08-16 DIAGNOSIS — E669 Obesity, unspecified: Secondary | ICD-10-CM | POA: Diagnosis not present

## 2021-08-16 DIAGNOSIS — K219 Gastro-esophageal reflux disease without esophagitis: Secondary | ICD-10-CM | POA: Diagnosis not present

## 2021-08-16 DIAGNOSIS — I10 Essential (primary) hypertension: Secondary | ICD-10-CM | POA: Diagnosis not present

## 2021-08-16 DIAGNOSIS — E039 Hypothyroidism, unspecified: Secondary | ICD-10-CM | POA: Diagnosis not present

## 2021-08-16 DIAGNOSIS — E1122 Type 2 diabetes mellitus with diabetic chronic kidney disease: Secondary | ICD-10-CM | POA: Diagnosis not present

## 2021-08-16 DIAGNOSIS — S32422A Displaced fracture of posterior wall of left acetabulum, initial encounter for closed fracture: Secondary | ICD-10-CM | POA: Diagnosis not present

## 2021-08-16 DIAGNOSIS — I5022 Chronic systolic (congestive) heart failure: Secondary | ICD-10-CM | POA: Diagnosis not present

## 2021-08-16 DIAGNOSIS — I2510 Atherosclerotic heart disease of native coronary artery without angina pectoris: Secondary | ICD-10-CM | POA: Diagnosis not present

## 2021-08-16 DIAGNOSIS — S32402A Unspecified fracture of left acetabulum, initial encounter for closed fracture: Secondary | ICD-10-CM | POA: Diagnosis not present

## 2021-08-16 DIAGNOSIS — R7309 Other abnormal glucose: Secondary | ICD-10-CM | POA: Diagnosis not present

## 2021-08-16 DIAGNOSIS — E119 Type 2 diabetes mellitus without complications: Secondary | ICD-10-CM | POA: Insufficient documentation

## 2021-08-16 DIAGNOSIS — Z01818 Encounter for other preprocedural examination: Secondary | ICD-10-CM | POA: Diagnosis not present

## 2021-08-16 DIAGNOSIS — Z5181 Encounter for therapeutic drug level monitoring: Secondary | ICD-10-CM | POA: Diagnosis not present

## 2021-08-17 ENCOUNTER — Ambulatory Visit (INDEPENDENT_AMBULATORY_CARE_PROVIDER_SITE_OTHER): Payer: Medicare HMO | Admitting: Family Medicine

## 2021-08-17 ENCOUNTER — Encounter: Payer: Self-pay | Admitting: Family Medicine

## 2021-08-17 ENCOUNTER — Encounter: Payer: Self-pay | Admitting: Cardiology

## 2021-08-17 VITALS — BP 138/78 | HR 84 | Temp 97.1°F | Ht 71.0 in

## 2021-08-17 DIAGNOSIS — Z1211 Encounter for screening for malignant neoplasm of colon: Secondary | ICD-10-CM

## 2021-08-17 DIAGNOSIS — E039 Hypothyroidism, unspecified: Secondary | ICD-10-CM

## 2021-08-17 DIAGNOSIS — Z87891 Personal history of nicotine dependence: Secondary | ICD-10-CM | POA: Diagnosis not present

## 2021-08-17 DIAGNOSIS — R7989 Other specified abnormal findings of blood chemistry: Secondary | ICD-10-CM | POA: Diagnosis not present

## 2021-08-17 DIAGNOSIS — S32402A Unspecified fracture of left acetabulum, initial encounter for closed fracture: Secondary | ICD-10-CM

## 2021-08-17 DIAGNOSIS — M80052G Age-related osteoporosis with current pathological fracture, left femur, subsequent encounter for fracture with delayed healing: Secondary | ICD-10-CM | POA: Diagnosis not present

## 2021-08-17 DIAGNOSIS — M80059G Age-related osteoporosis with current pathological fracture, unspecified femur, subsequent encounter for fracture with delayed healing: Secondary | ICD-10-CM | POA: Insufficient documentation

## 2021-08-17 DIAGNOSIS — Z8719 Personal history of other diseases of the digestive system: Secondary | ICD-10-CM | POA: Diagnosis not present

## 2021-08-17 MED ORDER — AMOXICILLIN 875 MG PO TABS: 875.0000 mg | ORAL_TABLET | Freq: Two times a day (BID) | ORAL | 0 refills | Status: AC

## 2021-08-17 NOTE — Progress Notes (Signed)
? ?Established Patient Office Visit ? ?Subjective   ?Patient ID: Billy [name], male    DOB: [birth year]  Age: 56 y.o. MRN: [id number] ? ?Chief Complaint  ?Patient presents with  ? Follow-up  ?  Concerns about low testosterone was recommended to have antibiotics prior to surgery and told to ask primary.   ? ? ?HPI status post closed fracture of left acetabulum after fall from standing at the end of this past March.  Patient is scheduled for surgery on Monday.  Testosterone was recently measured to be low within normal free testosterone level.  Recent dental infection in the left upper frontal tooth.  History of hypothyroidism.  TSH was measured normal this past month.  He was unable to go for scheduled colonoscopy consultation secondary to his hip fracture.  He quit smoking 3 years ago with a 30-year tobacco history.  Significant past medical history of ischemic cardiomyopathy coronary and peripheral vascular disease. ? ? ? ?Review of Systems  ?Constitutional: Negative.   ?HENT: Negative.    ?Eyes:  Negative for blurred vision, discharge and redness.  ?Respiratory: Negative.    ?Cardiovascular: Negative.   ?Gastrointestinal:  Negative for abdominal pain.  ?Genitourinary: Negative.   ?Musculoskeletal:  Positive for falls and joint pain. Negative for myalgias.  ?Skin:  Negative for rash.  ?Neurological:  Negative for tingling, loss of consciousness and weakness.  ?Endo/Heme/Allergies:  Negative for polydipsia.  ? ?  ?Objective:  ?  ? ?There were no vitals taken for this visit. ? ? ?Physical Exam ?Vitals and nursing note reviewed.  ?Constitutional:   ?   General: He is not in acute distress. ?   Appearance: Normal appearance. He is not ill-appearing, toxic-appearing or diaphoretic.  ?   Comments: He is wheelchair-bound.  ?HENT:  ?   Head: Normocephalic and atraumatic.  ?   Right Ear: External ear normal.  ?   Left Ear: External ear normal.  ?Eyes:  ?   General: No scleral icterus.    ?   Right eye: No discharge.     ?    Left eye: No discharge.  ?   Extraocular Movements: Extraocular movements intact.  ?   Conjunctiva/sclera: Conjunctivae normal.  ?Cardiovascular:  ?   Rate and Rhythm: Normal rate and regular rhythm.  ?Pulmonary:  ?   Effort: Pulmonary effort is normal. No respiratory distress.  ?   Breath sounds: Normal breath sounds.  ?Abdominal:  ?   General: Bowel sounds are normal.  ?Musculoskeletal:  ?   Cervical back: No rigidity or tenderness.  ?Skin: ?   General: Skin is warm and dry.  ?Neurological:  ?   Mental Status: He is alert and oriented to person, place, and time.  ?Psychiatric:     ?   Mood and Affect: Mood normal.     ?   Behavior: Behavior normal.  ? ? ? ?No results found for any visits on 08/17/21. ? ? ? ?The ASCVD Risk score (Arnett DK, et al., 2019) failed to calculate for the following reasons: ?  The patient has a prior MI or stroke diagnosis ? ?  ?Assessment & Plan:  ? ?Problem List Items Addressed This Visit   ? ?  ? Endocrine  ? Hypothyroidism - Primary  ?  ? Musculoskeletal and Integument  ? Closed left acetabular fracture ([place])  ? Osteoporotic hip fracture with delayed healing  ? Relevant Orders  ? Ambulatory referral to Endocrinology  ?  ? Other  ? Colon cancer  screening  ? Relevant Orders  ? Ambulatory referral to Gastroenterology  ? H/O dental abscess  ? Relevant Medications  ? amoxicillin (AMOXIL) 875 MG tablet  ? History of tobacco use  ? Relevant Orders  ? Ambulatory Referral Lung Cancer Screening [facility]  ? Low testosterone in male  ? Relevant Orders  ? Ambulatory referral to Endocrinology  ? ? ?Return in about 6 months (around 02/17/2022).  ? ? ?[name], MD ? ?

## 2021-08-18 ENCOUNTER — Telehealth: Payer: Self-pay

## 2021-08-18 DIAGNOSIS — S32422A Displaced fracture of posterior wall of left acetabulum, initial encounter for closed fracture: Secondary | ICD-10-CM | POA: Diagnosis not present

## 2021-08-18 DIAGNOSIS — M25452 Effusion, left hip: Secondary | ICD-10-CM | POA: Diagnosis not present

## 2021-08-18 DIAGNOSIS — Z01818 Encounter for other preprocedural examination: Secondary | ICD-10-CM | POA: Diagnosis not present

## 2021-08-18 DIAGNOSIS — S32412A Displaced fracture of anterior wall of left acetabulum, initial encounter for closed fracture: Secondary | ICD-10-CM | POA: Diagnosis not present

## 2021-08-18 NOTE — Telephone Encounter (Signed)
Spoke with pt's wife,DPR who reports pt is scheduled for hip surgery on 08/22/2021.  She is asking if office has received clearance request yet from surgeons office. She has provided fax number to Dr Clydell Hakim office.  Pt will be having surgery at [facility]. Will forward information to pre-op team for review.

## 2021-08-18 NOTE — Telephone Encounter (Signed)
Called pt's Wife back to inform her that we have not received any clearance form for his surgery. She will call the Surgeons office and have them send it today. His surgery is scheduled for Monday 08/22/21.

## 2021-08-19 ENCOUNTER — Ambulatory Visit: Payer: Medicare HMO | Admitting: Physician Assistant

## 2021-08-19 ENCOUNTER — Encounter: Payer: Self-pay | Admitting: Physician Assistant

## 2021-08-19 ENCOUNTER — Telehealth: Payer: Self-pay | Admitting: *Deleted

## 2021-08-19 ENCOUNTER — Ambulatory Visit (INDEPENDENT_AMBULATORY_CARE_PROVIDER_SITE_OTHER): Payer: Medicare HMO | Admitting: Physician Assistant

## 2021-08-19 VITALS — BP 120/78 | HR 91 | Ht 71.0 in | Wt 259.0 lb

## 2021-08-19 DIAGNOSIS — E785 Hyperlipidemia, unspecified: Secondary | ICD-10-CM

## 2021-08-19 DIAGNOSIS — Z01818 Encounter for other preprocedural examination: Secondary | ICD-10-CM | POA: Diagnosis not present

## 2021-08-19 DIAGNOSIS — I480 Paroxysmal atrial fibrillation: Secondary | ICD-10-CM | POA: Diagnosis not present

## 2021-08-19 DIAGNOSIS — I255 Ischemic cardiomyopathy: Secondary | ICD-10-CM | POA: Diagnosis not present

## 2021-08-19 DIAGNOSIS — I10 Essential (primary) hypertension: Secondary | ICD-10-CM | POA: Diagnosis not present

## 2021-08-19 DIAGNOSIS — I25810 Atherosclerosis of coronary artery bypass graft(s) without angina pectoris: Secondary | ICD-10-CM

## 2021-08-19 DIAGNOSIS — Z95810 Presence of automatic (implantable) cardiac defibrillator: Secondary | ICD-10-CM | POA: Diagnosis not present

## 2021-08-19 NOTE — Telephone Encounter (Signed)
Billy [name] was seen in my clinic on 08/19/2021.  He has upcoming total hip replacement.  He was recently admitted in early April and was cleared by cardiology service Dr. Stanford Breed to proceed with ORIF of left posterior acetabular fracture.  Patient did not have cardiac complications after the surgery.  Recent echocardiogram in March 2023 demonstrated EF 40 to 45%.  EF is stable when compared to the previous echocardiogram.  Since discharge, patient has not had any chest discomfort or worsening dyspnea.  On exam, he appears to be euvolemic.  His last dose of Plavix was on Wednesday 5/17, last dose of Eliquis was yesterday on 5/18.    From the cardiac perspective, patient is cleared to proceed with upcoming total hip replacement.  He is a moderate risk patient given the prior cardiac history, however surgical risk is not prohibitive.  Perioperative risk is 6.6% for major cardiac event.  He may hold Plavix for 5 days prior to the procedure and Eliquis for 3 days prior to the procedure.  He is already holding both medication per instruction. He should be kept on cardiac telemetry during perioperative period for observation.  He is not being paced by his St Jude ICD, an magnet can be placed on his ICD during the surgery as precaution.

## 2021-08-19 NOTE — Telephone Encounter (Signed)
Prelim pharmD recs now available for review at [facility], routed otherwise onward additionally already as below

## 2021-08-19 NOTE — Progress Notes (Signed)
Cardiology Office Note:    Date:  08/19/2021   ID:  Billy [name], DOB March 07, 1966, MRN [id number]  PCP:  [name], MD   [facility] Providers Cardiologist:  Kirk Ruths, MD Electrophysiologist:  Virl Axe, MD     Referring MD: [name],*   Chief Complaint  Patient presents with   Follow-up   Pre-op Exam    Upcoming total hip surgery by Dr. Jaclynn Guarneri    History of Present Illness:    Billy [name] is a 56 y.o. male with a hx of CAD s/p CABG, VT s/p St Jude ICD, stage III CKD, hypertension, hyperlipidemia, ischemic cardiomyopathy, PAF, and PAD.  Patient suffered a cardiac arrest in November 2010.  Echocardiogram at the time shows EF of 10 to 15%, however follow-up echo showed EF improved to 40 to 45%.  Given out of the hospital VF arrest, a St Jude single lead ICD was placed.  Patient had episode of atrial fibrillation in November 2020 and had dyspnea on Brilinta.  Cardiac catheterization in February 2022 showed old occlusion of D1, old occlusion of mid left circumflex but good collateral to the distal vessel, total occlusion of mid RCA with distal collateralization to the PDA and PLA, atretic LIMA-LAD, patent SVG to diagonal, occlusion of SVG-D1 and SVG-RCA.  Medical therapy was recommended.  Echocardiogram in March 2022 demonstrated EF 40 to 45%, grade 1 DD, moderate RV dysfunction.  Patient has PAD followed by Dr. Fletcher Anon and had a previous stent to the right external iliac artery.  He is on amiodarone for ventricular tachycardia.  He underwent SVT ablation in March 2022 as he had a history of SVT inducing VT/VF resulting in ICD shocks.  Patient was last seen by Dr. Stanford Breed in June 2022 at which time he was doing well.  Repeat echocardiogram obtained in March 2023 demonstrated EF 40 to 45%, mild LVH, global hypokinesis, grade 1 DD, trivial MR.  Patient underwent ICD device change out on 06/17/2021.  Postprocedure, his amiodarone was discontinued.    Patient  had a left hip fracture and was admitted on 07/01/2021.  Patient underwent closed reduction surgery by Dr. Tamera Punt on 3/1.  ICD was interrogated that showed very low burden of elevated heart rate.  No recent ICD shocks.  He was seen by Dr. Stanford Breed in the hospital on 07/03/2021 who felt he is cleared to proceed with surgery.  His revised cardiac risk index was 6.6% indicating at least moderately elevated risk of cardiac complication with surgery.  Perioperative telemetry monitoring was recommended.  Per device rep, can use magnet over device during surgery as a precaution.  Patient is not pacing with his device.  He underwent ORIF of posterior wall acetabular fracture repair by Dr. Ginette Pitman on 4/3.  Patient is in need of a total hip replacement, this has been scheduled for next Monday.   Past Medical History:  Diagnosis Date   Aortic atherosclerosis ([place])    on CXR   Blood in stool    C. difficile colitis    a. remote hx 2010.   Cardiac arrest - ventricular fibrillation 02/11/2009   a. 02/2009 s/p St Jude ICD.   Chronic combined systolic and diastolic CHF (congestive heart failure) ([name])    Chronic kidney disease, stage 3a ([name])    Coronary artery disease    a. PCI to Cx age 58. b. CABGx4 in 2003. c. BMS to OM1 in 12/2007. d. PTCA to Cx 01/2019.   Former tobacco use  Hyperlipidemia    Hypertension    Ischemic cardiomyopathy    Ischemic cardiomyopathy    Marijuana abuse    Myocardial infarct ([name])    NON-ST-SEGMENT ELEVATION MI   Obesity    PAD (peripheral artery disease) ([name])    PAF (paroxysmal atrial fibrillation) ([place])    Pathologic fracture of left acetabulum 07/03/2021   PSVT (paroxysmal supraventricular tachycardia) ([name])    Ventricular tachyarrhythmia ([name])    Ventricular tachycardia ([place])     Past Surgical History:  Procedure Laterality Date   ABDOMINAL AORTOGRAM W/LOWER EXTREMITY N/A 05/14/2019   Procedure: ABDOMINAL AORTOGRAM W/LOWER EXTREMITY;  Surgeon: Wellington Hampshire, MD;   Location: [facility];  Service: Cardiovascular;  Laterality: N/A;   CARDIAC DEFIBRILLATOR PLACEMENT     STJ single chamber ICD implanted for secondary prevention   CIRCUMCISION     CORONARY ARTERY BYPASS GRAFT  06/17/01   X 4   CORONARY BALLOON ANGIOPLASTY N/A 01/22/2019   Procedure: CORONARY BALLOON ANGIOPLASTY;  Surgeon: Leonie Man, MD;  Location: [facility];  Service: Cardiovascular;  Laterality: N/A;   HIP CLOSED REDUCTION Left 07/02/2021   Procedure: CLOSED REDUCTION HIP with placement of skeletal traction;  Surgeon: Tania Ade, MD;  Location: [facility];  Service: Orthopedics;  Laterality: Left;   ICD GENERATOR CHANGEOUT N/A 06/17/2021   Procedure: ICD GENERATOR CHANGEOUT;  Surgeon: Deboraha Sprang, MD;  Location: [facility];  Service: Cardiovascular;  Laterality: N/A;   LEFT HEART CATH AND CORS/GRAFTS ANGIOGRAPHY N/A 01/22/2019   Procedure: LEFT HEART CATH AND CORS/GRAFTS ANGIOGRAPHY;  Surgeon: Leonie Man, MD;  Location: [facility] [facility];  Service: Cardiovascular;  Laterality: N/A;   LEFT HEART CATH AND CORS/GRAFTS ANGIOGRAPHY N/A 05/18/2020   Procedure: LEFT HEART CATH AND CORS/GRAFTS ANGIOGRAPHY;  Surgeon: Troy Sine, MD;  Location: [facility];  Service: Cardiovascular;  Laterality: N/A;   OPEN REDUCTION INTERNAL FIXATION ACETABULUM FRACTURE POSTERIOR Left 07/04/2021   Procedure: OPEN REDUCTION INTERNAL FIXATION ACETABULUM FRACTURE POSTERIOR;  Surgeon: [place]e [place], MD;  Location: [facility];  Service: Orthopedics;  Laterality: Left;   PERIPHERAL VASCULAR INTERVENTION Right 05/14/2019   Procedure: PERIPHERAL VASCULAR INTERVENTION;  Surgeon: Wellington Hampshire, MD;  Location: [facility];  Service: Cardiovascular;  Laterality: Right;   SVT ABLATION N/A 06/21/2020   Procedure: SVT ABLATION;  Surgeon: Evans Lance, MD;  Location: [facility];  Service: Cardiovascular;  Laterality: N/A;    Current Medications: Current Meds   Medication Sig   amoxicillin (AMOXIL) 875 MG tablet Take 1 tablet (875 mg total) by mouth 2 (two) times daily for 10 days.   atorvastatin (LIPITOR) 80 MG tablet TAKE 1 TABLET EVERY DAY   carvedilol (COREG) 3.125 MG tablet Take 1 tablet (3.125 mg total) by mouth 2 (two) times daily with a meal.   cholecalciferol (VITAMIN D) 25 MCG tablet Take 2 tablets (2,000 Units total) by mouth 2 (two) times daily.   clopidogrel (PLAVIX) 75 MG tablet TAKE 1 TABLET EVERY DAY   Docusate Sodium (DSS) 100 MG CAPS TAKE 1 CAPSULE BY MOUTH 2 TIMES DAILY FOR 14 DAYS.   ELIQUIS 5 MG TABS tablet TAKE 1 TABLET (5 MG TOTAL) BY MOUTH 2 (TWO) TIMES DAILY.   ezetimibe (ZETIA) 10 MG tablet Take 1 tablet (10 mg total) by mouth daily.   fluticasone (FLONASE) 50 MCG/ACT nasal spray Place 2 sprays into both nostrils daily as needed for allergies or rhinitis. (Patient taking differently:  Place 2 sprays into both nostrils 2 (two) times daily as needed for allergies or rhinitis.)   furosemide (LASIX) 40 MG tablet TAKE 1 TABLET EVERY DAY (Patient taking differently: Take 40 mg by mouth daily.)   HYDROmorphone (DILAUDID) 2 MG tablet Take by mouth.   isosorbide mononitrate (IMDUR) 30 MG 24 hr tablet Take 0.5 tablets (15 mg total) by mouth daily.   levothyroxine (SYNTHROID) 50 MCG tablet Take 1 tablet (50 mcg total) by mouth daily before breakfast. 30 minutes before having breakfast.   methocarbamol 1000 MG TABS Take 1,000 mg by mouth every 8 (eight) hours as needed for muscle spasms.   montelukast (SINGULAIR) 10 MG tablet TAKE 1 TABLET EVERY DAY AS NEEDED FOR ALLERGIES (Patient taking differently: Take 10 mg by mouth daily.)   Multiple Vitamin (MULTIVITAMIN WITH MINERALS) TABS tablet Take 1 tablet by mouth daily.   nitroGLYCERIN (NITROSTAT) 0.4 MG SL tablet DISSOLVE 1 TABLET UNDER THE TONGUE EVERY 5 MINUTES FOR 3 DOSES AS NEEDED FOR CHEST PAIN   pantoprazole (PROTONIX) 40 MG tablet TAKE 1 TABLET EVERY DAY   polyethylene glycol (MIRALAX  / GLYCOLAX) 17 g packet Take 17 g by mouth daily.   potassium chloride SA (KLOR-CON M) 20 MEQ tablet TAKE 1 TABLET EVERY DAY (Patient taking differently: Take 20 mEq by mouth daily.)   sacubitril-valsartan (ENTRESTO) 24-26 MG TAKE 1 TABLET TWICE DAILY     Allergies:   Patient has no known allergies.   Social History   Socioeconomic History   Marital status: Married    Spouse name: Not on file   Number of children: 3   Years of education: 11   Highest education level: Not on file  Occupational History   Occupation: Disability  Tobacco Use   Smoking status: Former    Packs/day: 2.00    Years: 20.00    Pack years: 40.00    Types: Cigarettes    Quit date: 04/04/2003    Years since quitting: 18.3    Passive exposure: Never   Smokeless tobacco: Never  Vaping Use   Vaping Use: Never used  Substance and Sexual Activity   Alcohol use: No   Drug use: No   Sexual activity: Yes    Partners: Female  Other Topics Concern   Not on file  Social History Narrative   MARRIED   FORMER TOBACCO USE, SMOKED FOR 20 YRS 2 PPD; QUIT IN 2005   NO ETOH   NO ILLICIT DRUG USE   NO REGULAR EXERCISE         ICD-ST. JUDE ; CERTIFIED LETTER SENT AND RETURNED BAD ADDRESS, PLS UPDATE DJW.      Fun: Physiological scientist Strain: Low Risk    Difficulty of Paying Living Expenses: Not hard at all  Food Insecurity: No Food Insecurity   Worried About Charity fundraiser in the Last Year: Never true   Arboriculturist in the Last Year: Never true  Transportation Needs: No Transportation Needs   Lack of Transportation (Medical): No   Lack of Transportation (Non-Medical): No  Physical Activity: Insufficiently Active   Days of Exercise per Week: 5 days   Minutes of Exercise per Session: 20 min  Stress: No Stress Concern Present   Feeling of Stress : Not at all  Social Connections: Moderately Integrated   Frequency of Communication with Friends and Family: Twice  a week   Frequency of Social Gatherings with Friends  and Family: Twice a week   Attends Religious Services: 1 to 4 times per year   Active Member of Clubs or Organizations: No   Attends Archivist Meetings: Never   Marital Status: Married     Family History: The patient's family history includes Heart attack in his father; Heart disease in his sister; Hypertension in his mother and another family member.  ROS:   Please see the history of present illness.     All other systems reviewed and are negative.  EKGs/Labs/Other Studies Reviewed:    The following studies were reviewed today:  Echo 06/15/2021  1. Left ventricular ejection fraction, by estimation, is 40 to 45%. The  left ventricle has mildly decreased function. The left ventricle  demonstrates global hypokinesis. There is mild left ventricular  hypertrophy of the septal segment. Left ventricular  diastolic parameters are consistent with Grade I diastolic dysfunction  (impaired relaxation).   2. Right ventricular systolic function is mildly reduced. The right  ventricular size is normal.   3. The mitral valve is normal in structure. Trivial mitral valve  regurgitation. No evidence of mitral stenosis.   4. The aortic valve is tricuspid. Aortic valve regurgitation is not  visualized. No aortic stenosis is present.   5. The inferior vena cava is normal in size with greater than 50%  respiratory variability, suggesting right atrial pressure of 3 mmHg.   EKG:  EKG is ordered today.  The ekg ordered today demonstrates normal sinus rhythm, no significant ST-T wave changes, Q waves in the inferior lead.  Recent Labs: 11/29/2020: ALT 38 07/03/2021: B Natriuretic Peptide 50.3 07/04/2021: Magnesium 1.8; TSH 1.936 07/07/2021: Hemoglobin 12.1; Platelets 264 07/08/2021: BUN 9; Creatinine, Ser 1.15; Potassium 4.1; Sodium 136  Recent Lipid Panel    Component Value Date/Time   CHOL 153 11/29/2020 1018   CHOL 119 10/09/2019 0959    TRIG 122.0 11/29/2020 1018   HDL 43.40 11/29/2020 1018   HDL 32 (L) 10/09/2019 0959   CHOLHDL 4 11/29/2020 1018   VLDL 24.4 11/29/2020 1018   LDLCALC 85 11/29/2020 1018   LDLCALC 74 10/09/2019 0959   LDLDIRECT 95.0 11/29/2020 1018     Risk Assessment/Calculations:    CHA2DS2-VASc Score = 4   This indicates a 4.8% annual risk of stroke. The patient's score is based upon: CHF History: 1 HTN History: 1 Diabetes History: 1 Stroke History: 0 Vascular Disease History: 1 Age Score: 0 Gender Score: 0          Physical Exam:    VS:  BP 120/78 (BP Location: Right Arm, Patient Position: Sitting, Cuff Size: Normal)   Pulse 91   Ht '5\' 11"'$  (1.803 m)   Wt 259 lb (117.5 kg)   BMI 36.12 kg/m     Wt Readings from Last 3 Encounters:  08/19/21 259 lb (117.5 kg)  06/17/21 265 lb (120.2 kg)  05/30/21 270 lb 3.2 oz (122.6 kg)     GEN:  Well nourished, well developed in no acute distress HEENT: Normal NECK: No JVD; No carotid bruits LYMPHATICS: No lymphadenopathy CARDIAC: RRR, no murmurs, rubs, gallops RESPIRATORY:  Clear to auscultation without rales, wheezing or rhonchi  ABDOMEN: Soft, non-tender, non-distended MUSCULOSKELETAL:  No edema; No deformity  SKIN: Warm and dry NEUROLOGIC:  Alert and oriented x 3 PSYCHIATRIC:  Normal affect   ASSESSMENT:    1. Preop examination   2. Coronary artery disease involving coronary bypass graft of native heart without angina pectoris   3. ICD (  implantable cardioverter-defibrillator) in place   4. Essential hypertension   5. Hyperlipidemia LDL goal <70   6. Ischemic cardiomyopathy   7. PAF (paroxysmal atrial fibrillation) ([name])    PLAN:    In order of problems listed above:  Preoperative clearance: Patient has upcoming total hip replacement by Dr. Jaclynn Guarneri of [facility] system.  Patient was previously seen by Dr. Stanford Breed in early April prior to ORIF due to acetabular fracture.  He tolerated the surgery okay.  He has not had any recent chest  pain, worsening shortness of breath, heart failure symptoms or any ICD discharge.  Patient may proceed with total hip replacement as well.  His last dose of Plavix was on 5/17, last dose of Eliquis was on 5/18.  He may continue to hold both medication until his surgery on 5/22 and restart as soon as possible afterward at the surgeon's discretion.  Per device rep, patient is now being paced by his device and can use magnet over his ICD during surgery as a precaution.  CAD s/p CABG: Last cardiac catheterization performed in February 2022 demonstrated atretic LIMA to LAD, occluded SVG to OM and occluded SVG to distal RCA, patent SVG to proximal diagonal, medical therapy was recommended.  History VT s/p ICD: Underwent ICD device change out in March 2023  Hypertension: Blood pressure stable on current therapy  Hyperlipidemia: On Zetia and Lipitor  Ischemic cardiomyopathy: Recent echocardiogram showed stable EF of 40 to 45% in March 2023  PAF: On Eliquis, currently being held in anticipation of surgery since this morning.  Our clinical pharmacist also recommended holding Eliquis for 3 days prior to the surgery.           Medication Adjustments/Labs and Tests Ordered: Current medicines are reviewed at length with the patient today.  Concerns regarding medicines are outlined above.  Orders Placed This Encounter  Procedures   EKG 12-Lead   No orders of the defined types were placed in this encounter.   Patient Instructions  Medication Instructions:  Your physician recommends that you continue on your current medications as directed. Please refer to the Current Medication list given to you today.  *If you need a refill on your cardiac medications before your next appointment, please call your pharmacy*  Lab Work: NONE ordered at this time of appointment   If you have labs (blood work) drawn today and your tests are completely normal, you will receive your results only by: [place]  (if you have MyChart) OR A paper copy in the mail If you have any lab test that is abnormal or we need to change your treatment, we will call you to review the results.   Testing/Procedures: NONE ordered at this time of appointment   Follow-Up: At [facility], you and your health needs are our priority.  As part of our continuing mission to provide you with exceptional heart care, we have created designated Provider Care Teams.  These Care Teams include your primary Cardiologist (physician) and Advanced Practice Providers (APPs -  Physician Assistants and Nurse Practitioners) who all work together to provide you with the care you need, when you need it.  Your next appointment:   3-5 month(s)  The format for your next appointment:   In Person  Provider:   Kirk Ruths, MD     Other Instructions   Important Information About Sugar         Hilbert Corrigan, [place]  08/19/2021 4:50 PM    [facility] 

## 2021-08-19 NOTE — Telephone Encounter (Signed)
OK PER HAO MENG, PAC TO ADD ON TO SCHEDULE FOR URGENT PRE OP CLEARANCE.Marland KitchenMarland KitchenCMF  I s/w the pt's wife and explained the pt needs an IN OFFICE appt per pre op provider. Our practice just received clearance request today. I spent over 30 minutes with the pt's wife trying to find an appt for pre op clearance. I was able to secure Dr. Jacalyn Lefevre nurse Hilda Blades who was a great help in this matter. Hilda Blades was able to help me by speaking with Almyra Deforest, [facility] who is in the office today at [facility]. Isaac Laud has graciously accepted to add the pt onto his schedule for today. Great team work with my co-workers. I will send notes to [facility] for appt today. I will send FYI to requesting office the pt has appt today in order to try and clear him for the surgery for Monday 08/22/21.

## 2021-08-19 NOTE — Telephone Encounter (Signed)
   Pre-operative Risk Assessment    Patient Name: Billy [name]  DOB: Oct 19, 1965 MRN: [id number]      Request for Surgical Clearance    Procedure:  total hip replacement  Date of Surgery:  Clearance 08/22/21                                 Surgeon: dr Quillian Quince bracey Surgeon's Group or Practice Name:  [facility] Phone number:  [phone] Fax number:  [phone]   Type of Clearance Requested:  medical   Type of Anesthesia:   combination of general and spinal/epidural    Additional requests/questions:    [name]   08/19/2021, 10:53 AM

## 2021-08-19 NOTE — Patient Instructions (Signed)
Medication Instructions:  Your physician recommends that you continue on your current medications as directed. Please refer to the Current Medication list given to you today.  *If you need a refill on your cardiac medications before your next appointment, please call your pharmacy*  Lab Work: NONE ordered at this time of appointment   If you have labs (blood work) drawn today and your tests are completely normal, you will receive your results only by: [place] (if you have MyChart) OR A paper copy in the mail If you have any lab test that is abnormal or we need to change your treatment, we will call you to review the results.   Testing/Procedures: NONE ordered at this time of appointment   Follow-Up: At [facility], you and your health needs are our priority.  As part of our continuing mission to provide you with exceptional heart care, we have created designated Provider Care Teams.  These Care Teams include your primary Cardiologist (physician) and Advanced Practice Providers (APPs -  Physician Assistants and Nurse Practitioners) who all work together to provide you with the care you need, when you need it.  Your next appointment:   3-5 month(s)  The format for your next appointment:   In Person  Provider:   Kirk Ruths, MD     Other Instructions   Important Information About Sugar

## 2021-08-19 NOTE — Telephone Encounter (Signed)
Patient with diagnosis of A Fib on Eliquis for anticoagulation.    Procedure: 08/22/21 Date of procedure: total hip replacement   CHA2DS2-VASc Score = 4  This indicates a 4.8% annual risk of stroke. The patient's score is based upon: CHF History: 1 HTN History: 1 Diabetes History: 1 Stroke History: 0 Vascular Disease History: 1 Age Score: 0 Gender Score: 0   CrCl 85 mL/min Platelet count 359K  Per office protocol, patient can hold Eliquis for 3 days prior to procedure.

## 2021-08-19 NOTE — Telephone Encounter (Signed)
   Name: Quill Grinder  DOB: 1965-10-05  MRN: [id number]  Primary Cardiologist: Kirk Ruths, MD  Chart reviewed as part of pre-operative protocol coverage. Because of Furkan Teffeteller's past medical history and time since last visit, he will require a follow-up in-office visit in order to better assess preoperative cardiovascular risk.  Last EP visit 05/2021, last primary cardiology visit 09/2020 with recommendation to f/u in 6 months. This has not occurred yet. Given very complex PMH and overdue general cardiology f/u, needs visit. Patient is also on Plavix and Eliquis and clearance for holding will need to be clarified with adequate holding before surgery.  Pre-op covering staff: - Please schedule appointment ASAP and call patient to inform them. - Please contact requesting surgeon's office via preferred method (i.e, phone, fax) to inform them of need for appointment prior to surgery.   Per Dr. Jacalyn Lefevre note, "Patient suffered a cardiac arrest in November of 2010. Echocardiogram initially revealed an EF of 10-15% however followup echo showed an EF of 40-45. Given out of the hospital VF arrest a [employer] single lead ICD was placed. Also had episode of atrial fibrillation November 2020 and dyspnea with Brilinta.  Cardiac catheterization February 2022 showed old occlusion of the first diagonal, old occlusion of the mid circumflex but with good collateralization to the distal vessel, mid total occlusion of the right coronary artery with collateralization to the distal PDA and PLA, atretic LIMA to the LAD, patent SVG to the diagonal and occlusion of the saphenous vein graft to the OM 2 and of the SVG to distal right coronary artery.  Medical therapy recommended.  Echocardiogram March 2022 showed ejection fraction 40 to 60%, grade 1 diastolic dysfunction, moderate RV dysfunction.  Patient also with peripheral vascular disease followed by Dr. Fletcher Anon; he has had previous stent to the right external iliac  artery.  Also with previous ablation of atrial tachycardia.  On amiodarone for ventricular tachycardia." This message will also be routed to pharmacy pool for input on holding Eliquis and Dr. Stanford Breed for holding Plavix that this information is available to the clearing provider at time of patient's appointment.   Charlie Pitter, PA-C  08/19/2021, 11:04 AM

## 2021-08-22 ENCOUNTER — Ambulatory Visit: Payer: Medicare HMO

## 2021-08-22 DIAGNOSIS — M9669 Fracture of other bone following insertion of orthopedic implant, joint prosthesis, or bone plate: Secondary | ICD-10-CM | POA: Diagnosis not present

## 2021-08-22 DIAGNOSIS — I5022 Chronic systolic (congestive) heart failure: Secondary | ICD-10-CM | POA: Diagnosis not present

## 2021-08-22 DIAGNOSIS — E1122 Type 2 diabetes mellitus with diabetic chronic kidney disease: Secondary | ICD-10-CM | POA: Diagnosis not present

## 2021-08-22 DIAGNOSIS — T84228A Displacement of internal fixation device of other bones, initial encounter: Secondary | ICD-10-CM | POA: Diagnosis not present

## 2021-08-22 DIAGNOSIS — I4901 Ventricular fibrillation: Secondary | ICD-10-CM | POA: Diagnosis not present

## 2021-08-22 DIAGNOSIS — D62 Acute posthemorrhagic anemia: Secondary | ICD-10-CM | POA: Diagnosis not present

## 2021-08-22 DIAGNOSIS — E871 Hypo-osmolality and hyponatremia: Secondary | ICD-10-CM | POA: Diagnosis not present

## 2021-08-22 DIAGNOSIS — I2510 Atherosclerotic heart disease of native coronary artery without angina pectoris: Secondary | ICD-10-CM | POA: Diagnosis not present

## 2021-08-22 DIAGNOSIS — Z471 Aftercare following joint replacement surgery: Secondary | ICD-10-CM | POA: Diagnosis not present

## 2021-08-22 DIAGNOSIS — I130 Hypertensive heart and chronic kidney disease with heart failure and stage 1 through stage 4 chronic kidney disease, or unspecified chronic kidney disease: Secondary | ICD-10-CM | POA: Diagnosis not present

## 2021-08-22 DIAGNOSIS — S32422K Displaced fracture of posterior wall of left acetabulum, subsequent encounter for fracture with nonunion: Secondary | ICD-10-CM | POA: Diagnosis not present

## 2021-08-22 DIAGNOSIS — S32402A Unspecified fracture of left acetabulum, initial encounter for closed fracture: Secondary | ICD-10-CM | POA: Diagnosis not present

## 2021-08-22 DIAGNOSIS — Z96642 Presence of left artificial hip joint: Secondary | ICD-10-CM | POA: Diagnosis not present

## 2021-08-22 DIAGNOSIS — N189 Chronic kidney disease, unspecified: Secondary | ICD-10-CM | POA: Diagnosis not present

## 2021-08-22 DIAGNOSIS — R000 Tachycardia, unspecified: Secondary | ICD-10-CM | POA: Diagnosis not present

## 2021-08-22 DIAGNOSIS — I4891 Unspecified atrial fibrillation: Secondary | ICD-10-CM | POA: Diagnosis not present

## 2021-08-22 DIAGNOSIS — E875 Hyperkalemia: Secondary | ICD-10-CM | POA: Diagnosis not present

## 2021-08-22 DIAGNOSIS — S32422A Displaced fracture of posterior wall of left acetabulum, initial encounter for closed fracture: Secondary | ICD-10-CM | POA: Diagnosis not present

## 2021-08-29 ENCOUNTER — Encounter (HOSPITAL_BASED_OUTPATIENT_CLINIC_OR_DEPARTMENT_OTHER): Payer: Self-pay | Admitting: Emergency Medicine

## 2021-08-29 ENCOUNTER — Emergency Department (HOSPITAL_BASED_OUTPATIENT_CLINIC_OR_DEPARTMENT_OTHER): Payer: Medicare HMO | Admitting: Radiology

## 2021-08-29 ENCOUNTER — Emergency Department (HOSPITAL_BASED_OUTPATIENT_CLINIC_OR_DEPARTMENT_OTHER)
Admission: EM | Admit: 2021-08-29 | Discharge: 2021-08-30 | Disposition: A | Discharge status: Other Acute Inpatient Hospital | Payer: Medicare HMO | Attending: Emergency Medicine | Admitting: Emergency Medicine

## 2021-08-29 ENCOUNTER — Other Ambulatory Visit: Payer: Self-pay

## 2021-08-29 DIAGNOSIS — Z471 Aftercare following joint replacement surgery: Secondary | ICD-10-CM | POA: Diagnosis not present

## 2021-08-29 DIAGNOSIS — Z7902 Long term (current) use of antithrombotics/antiplatelets: Secondary | ICD-10-CM | POA: Diagnosis not present

## 2021-08-29 DIAGNOSIS — S73005A Unspecified dislocation of left hip, initial encounter: Secondary | ICD-10-CM | POA: Insufficient documentation

## 2021-08-29 DIAGNOSIS — M25522 Pain in left elbow: Secondary | ICD-10-CM | POA: Diagnosis not present

## 2021-08-29 DIAGNOSIS — Z96642 Presence of left artificial hip joint: Secondary | ICD-10-CM | POA: Diagnosis not present

## 2021-08-29 DIAGNOSIS — S79912A Unspecified injury of left hip, initial encounter: Secondary | ICD-10-CM | POA: Diagnosis present

## 2021-08-29 DIAGNOSIS — Z79899 Other long term (current) drug therapy: Secondary | ICD-10-CM | POA: Insufficient documentation

## 2021-08-29 DIAGNOSIS — Z7901 Long term (current) use of anticoagulants: Secondary | ICD-10-CM | POA: Insufficient documentation

## 2021-08-29 DIAGNOSIS — E669 Obesity, unspecified: Secondary | ICD-10-CM | POA: Diagnosis not present

## 2021-08-29 DIAGNOSIS — T84021A Dislocation of internal left hip prosthesis, initial encounter: Secondary | ICD-10-CM | POA: Diagnosis not present

## 2021-08-29 DIAGNOSIS — X501XXA Overexertion from prolonged static or awkward postures, initial encounter: Secondary | ICD-10-CM | POA: Insufficient documentation

## 2021-08-29 DIAGNOSIS — M25552 Pain in left hip: Secondary | ICD-10-CM | POA: Diagnosis not present

## 2021-08-29 MED ORDER — PROPOFOL 10 MG/ML IV BOLUS: 0.5000 mg/kg | Freq: Once | INTRAVENOUS | Status: DC

## 2021-08-29 MED ORDER — HYDROCODONE-ACETAMINOPHEN 5-325 MG PO TABS
2.0000 | ORAL_TABLET | Freq: Once | ORAL | Status: AC
  Administered 2021-08-29: 2 via ORAL
  Filled 2021-08-29: qty 2

## 2021-08-29 MED ORDER — KETAMINE HCL 10 MG/ML IJ SOLN: 0.5000 mg/kg | Freq: Once | INTRAMUSCULAR | Status: DC

## 2021-08-29 NOTE — ED Provider Notes (Addendum)
[facility] EMERGENCY DEPT Provider Note   CSN: [id number] Arrival date & time: 08/29/21  1914     History  Chief Complaint  Patient presents with   Hip Pain    Billy [name] is a 56 y.o. male who presents to the ED complaining of left hip pain onset today.  Patient had total left hip replacement through [facility] with Dr. Denton Brick on 08/22/2021.  He has a follow-up appoint with Dr. Jaclynn Guarneri on 09/06/2021.  He has been taking 5 mg oxycodone with his last dose being at 1 PM today.  Patient notes that he was attempting to get up from a chair and felt a pop to the hip and has had pain since and unable to walk.   The history is provided by the patient. No language interpreter was used.      Home Medications Prior to Admission medications   Medication Sig Start Date End Date Taking? Authorizing Provider  atorvastatin (LIPITOR) 80 MG tablet TAKE 1 TABLET EVERY DAY 07/06/21   Deboraha Sprang, MD  carvedilol (COREG) 3.125 MG tablet Take 1 tablet (3.125 mg total) by mouth 2 (two) times daily with a meal. 08/08/21   Crenshaw, Denice Bors, MD  cholecalciferol (VITAMIN D) 25 MCG tablet Take 2 tablets (2,000 Units total) by mouth 2 (two) times daily. 07/08/21   Shelly Coss, MD  clopidogrel (PLAVIX) 75 MG tablet TAKE 1 TABLET EVERY DAY 07/06/21   Deboraha Sprang, MD  Docusate Sodium (DSS) 100 MG CAPS TAKE 1 CAPSULE BY MOUTH 2 TIMES DAILY FOR 14 DAYS. 07/08/21   [provider]  ELIQUIS 5 MG TABS tablet TAKE 1 TABLET (5 MG TOTAL) BY MOUTH 2 (TWO) TIMES DAILY. 07/06/21   Lelon Perla, MD  ezetimibe (ZETIA) 10 MG tablet Take 1 tablet (10 mg total) by mouth daily. 12/09/20   Lelon Perla, MD  fluticasone (FLONASE) 50 MCG/ACT nasal spray Place 2 sprays into both nostrils daily as needed for allergies or rhinitis. Patient taking differently: Place 2 sprays into both nostrils 2 (two) times daily as needed for allergies or rhinitis. 06/11/19   Nicolette Bang, MD  furosemide  (LASIX) 40 MG tablet TAKE 1 TABLET EVERY DAY Patient taking differently: Take 40 mg by mouth daily. 06/29/21   Lelon Perla, MD  HYDROmorphone (DILAUDID) 2 MG tablet Take by mouth. 08/10/21   [provider]  isosorbide mononitrate (IMDUR) 30 MG 24 hr tablet Take 0.5 tablets (15 mg total) by mouth daily. 07/09/21   Shelly Coss, MD  levothyroxine (SYNTHROID) 50 MCG tablet Take 1 tablet (50 mcg total) by mouth daily before breakfast. 30 minutes before having breakfast. 06/06/21   [name], MD  methocarbamol 1000 MG TABS Take 1,000 mg by mouth every 8 (eight) hours as needed for muscle spasms. 07/08/21   Shelly Coss, MD  montelukast (SINGULAIR) 10 MG tablet TAKE 1 TABLET EVERY DAY AS NEEDED FOR ALLERGIES Patient taking differently: Take 10 mg by mouth daily. 09/16/20   Camillia Herter, NP  Multiple Vitamin (MULTIVITAMIN WITH MINERALS) TABS tablet Take 1 tablet by mouth daily.    [provider]  nitroGLYCERIN (NITROSTAT) 0.4 MG SL tablet DISSOLVE 1 TABLET UNDER THE TONGUE EVERY 5 MINUTES FOR 3 DOSES AS NEEDED FOR CHEST PAIN 08/15/21   Lelon Perla, MD  pantoprazole (PROTONIX) 40 MG tablet TAKE 1 TABLET EVERY DAY 08/08/21   Lelon Perla, MD  polyethylene glycol (MIRALAX / GLYCOLAX) 17 g packet Take  17 g by mouth daily. 07/09/21   Shelly Coss, MD  potassium chloride SA (KLOR-CON M) 20 MEQ tablet TAKE 1 TABLET EVERY DAY Patient taking differently: Take 20 mEq by mouth daily. 05/23/21   Lelon Perla, MD  sacubitril-valsartan (ENTRESTO) 24-26 MG TAKE 1 TABLET TWICE DAILY 07/06/21   Deboraha Sprang, MD      Allergies    Patient has no known allergies.    Review of Systems   Review of Systems  Constitutional:  Negative for chills and fever.  Gastrointestinal:  Negative for nausea and vomiting.  Musculoskeletal:  Positive for arthralgias. Negative for joint swelling.  Skin:  Negative for color change, rash and wound.  All other systems reviewed and are  negative.  Physical Exam Updated Vital Signs BP (!) 109/52   Pulse 92   Temp 98 F (36.7 C)   Resp 18   Ht '5\' 10"'$  (1.778 m)   Wt 117.5 kg   SpO2 97%   BMI 37.16 kg/m  Physical Exam Vitals and nursing note reviewed.  Constitutional:      General: He is not in acute distress.    Appearance: He is not diaphoretic.  HENT:     Head: Normocephalic and atraumatic.     Mouth/Throat:     Pharynx: No oropharyngeal exudate.  Eyes:     General: No scleral icterus.    Conjunctiva/sclera: Conjunctivae normal.  Cardiovascular:     Rate and Rhythm: Normal rate and regular rhythm.     Pulses: Normal pulses.     Heart sounds: Normal heart sounds.  Pulmonary:     Effort: Pulmonary effort is normal. No respiratory distress.     Breath sounds: Normal breath sounds. No wheezing.  Abdominal:     General: Bowel sounds are normal.     Palpations: Abdomen is soft. There is no mass.     Tenderness: There is no abdominal tenderness. There is no guarding or rebound.  Musculoskeletal:        General: Normal range of motion.     Cervical back: Normal range of motion and neck supple.     Comments: Tenderness to palpation noted to anterior left hip.  No overlying skin changes noted to bandage over surgical site.  Left leg is externally rotated.  Pedal pulses intact.  Skin:    General: Skin is warm and dry.  Neurological:     Mental Status: He is alert.  Psychiatric:        Behavior: Behavior normal.    ED Results / Procedures / Treatments   Labs (all labs ordered are listed, but only abnormal results are displayed) Labs Reviewed - No data to display  EKG None  Radiology DG Hip Unilat With Pelvis 2-3 Views Left  Result Date: 08/29/2021 CLINICAL DATA:  Hip pain EXAM: DG HIP (WITH OR WITHOUT PELVIS) 2-3V LEFT COMPARISON:  None Available. FINDINGS: Patient is status post left hip replacement. There is superior dislocation. No visible fracture. IMPRESSION: Superior dislocation of the left hip  replacement. Electronically Signed   By: Rolm Baptise M.D.   On: 08/29/2021 20:31    Procedures Procedures    Medications Ordered in ED Medications  HYDROcodone-acetaminophen (NORCO/VICODIN) 5-325 MG per tablet 2 tablet (2 tablets Oral Given 08/29/21 2323)    ED Course/ Medical Decision Making/ A&P Clinical Course as of 08/30/21 0021  Mon Aug 29, 2021  2115 Attending consulted with Orthopedist, Dr. Mardelle Matte who recommends reduction in the ED.  [SB]  Clinical Course User Index [SB] Ahni Bradwell A, PA-C                           Medical Decision Making Amount and/or Complexity of Data Reviewed Radiology: ordered.  Risk Prescription drug management.   Patient with left hip pain onset today.  Patient had total left hip replacement on 08/22/2021 completed at [facility].  Vital signs stable, patient afebrile, not tachycardic or hypoxic.  On exam patient with tenderness to palpation noted to anterior left hip.  No overlying skin changes noted to bandage over surgical site.  Left leg is externally rotated.  Pedal pulses intact.  No acute cardiovascular or respiratory exam findings.  Differential diagnosis includes fracture, dislocation, septic arthritis.  Additional history obtained:  External records from outside source obtained and reviewed including: Total hip replacement completed at [facility] by Dr. Denton Brick on 08/22/2021.   Imaging: I ordered imaging studies including left hip x-ray I independently visualized and interpreted imaging which showed: Superior dislocation of left hip replacement I agree with the radiologist interpretation  Medications:  I ordered medication including Norco for symptom management ordered fentanyl for patient prior to transfer. Reevaluation of the patient after these medicines and interventions, I reevaluated the patient and found that they have stayed the same I have reviewed the patients home medicines and have made adjustments as  needed   Consultations: Attending requested consultation with the [place], and discussed lab and imaging findings as well as pertinent plan - they recommend: Reduction in the ED Attending consulted with orthopedist at [facility] Dr. Victorino Sparrow who recommends transfer to [facility] at this time.  Disposition: Presentation suspicious for dislocation in the setting of recent hip replacement.  Doubt fracture or septic arthritis at this time.  After consideration of the diagnostic results and the patients response to treatment, I feel that the patient would benefit from transfer to [facility].  Case discussed with attending who evaluated patient at bedside and agrees with plans for transfer.  Discussed with patient plans for transfer, attending answered all available questions.  Patient appears safe for transfer at this time.   This chart was dictated using voice recognition software, Dragon. Despite the best efforts of this provider to proofread and correct errors, errors may still occur which can change documentation meaning.  Final Clinical Impression(s) / ED Diagnoses Final diagnoses:  Hip dislocation, left, initial encounter [facility])    Rx / DC Orders ED Discharge Orders     None         Werner Labella A, PA-C 08/30/21 0022    Conchetta Lamia A, PA-C 37/90/24 0973    Lianne Cure, DO 53/29/92 2327

## 2021-08-29 NOTE — ED Triage Notes (Signed)
Pt had total hip replacement 6 days ago. Today was getting out of a chair and felt a pop in left hip and is not able put weight on it or move left leg.

## 2021-08-29 NOTE — ED Notes (Signed)
Patient transported to X-ray 

## 2021-08-30 ENCOUNTER — Ambulatory Visit: Payer: Medicare HMO | Admitting: Family Medicine

## 2021-08-30 ENCOUNTER — Telehealth: Payer: Self-pay | Admitting: Family Medicine

## 2021-08-30 DIAGNOSIS — E1151 Type 2 diabetes mellitus with diabetic peripheral angiopathy without gangrene: Secondary | ICD-10-CM | POA: Diagnosis not present

## 2021-08-30 DIAGNOSIS — E1122 Type 2 diabetes mellitus with diabetic chronic kidney disease: Secondary | ICD-10-CM | POA: Diagnosis not present

## 2021-08-30 DIAGNOSIS — Z6837 Body mass index (BMI) 37.0-37.9, adult: Secondary | ICD-10-CM | POA: Diagnosis not present

## 2021-08-30 DIAGNOSIS — S73005A Unspecified dislocation of left hip, initial encounter: Secondary | ICD-10-CM | POA: Diagnosis not present

## 2021-08-30 DIAGNOSIS — I5022 Chronic systolic (congestive) heart failure: Secondary | ICD-10-CM | POA: Diagnosis not present

## 2021-08-30 DIAGNOSIS — Z96642 Presence of left artificial hip joint: Secondary | ICD-10-CM | POA: Diagnosis not present

## 2021-08-30 DIAGNOSIS — I2510 Atherosclerotic heart disease of native coronary artery without angina pectoris: Secondary | ICD-10-CM | POA: Diagnosis not present

## 2021-08-30 DIAGNOSIS — Z7901 Long term (current) use of anticoagulants: Secondary | ICD-10-CM | POA: Diagnosis not present

## 2021-08-30 DIAGNOSIS — I130 Hypertensive heart and chronic kidney disease with heart failure and stage 1 through stage 4 chronic kidney disease, or unspecified chronic kidney disease: Secondary | ICD-10-CM | POA: Diagnosis not present

## 2021-08-30 DIAGNOSIS — I4891 Unspecified atrial fibrillation: Secondary | ICD-10-CM | POA: Diagnosis not present

## 2021-08-30 DIAGNOSIS — X501XXA Overexertion from prolonged static or awkward postures, initial encounter: Secondary | ICD-10-CM | POA: Diagnosis not present

## 2021-08-30 DIAGNOSIS — Z79899 Other long term (current) drug therapy: Secondary | ICD-10-CM | POA: Diagnosis not present

## 2021-08-30 DIAGNOSIS — M25552 Pain in left hip: Secondary | ICD-10-CM | POA: Diagnosis not present

## 2021-08-30 DIAGNOSIS — S79912A Unspecified injury of left hip, initial encounter: Secondary | ICD-10-CM | POA: Diagnosis present

## 2021-08-30 DIAGNOSIS — Z7902 Long term (current) use of antithrombotics/antiplatelets: Secondary | ICD-10-CM | POA: Diagnosis not present

## 2021-08-30 DIAGNOSIS — E669 Obesity, unspecified: Secondary | ICD-10-CM | POA: Diagnosis not present

## 2021-08-30 DIAGNOSIS — T84021A Dislocation of internal left hip prosthesis, initial encounter: Secondary | ICD-10-CM | POA: Diagnosis not present

## 2021-08-30 MED ORDER — FENTANYL CITRATE PF 50 MCG/ML IJ SOSY
50.0000 ug | PREFILLED_SYRINGE | Freq: Once | INTRAMUSCULAR | Status: AC
  Administered 2021-08-30: 50 ug via INTRAMUSCULAR
  Filled 2021-08-30: qty 1

## 2021-08-30 NOTE — Telephone Encounter (Signed)
Pt was a no show on 08/30/21 with Dr. Ethelene Hal for an OV, I sent a no show letter.

## 2021-08-30 NOTE — ED Notes (Signed)
Gave information to [facility] for transport

## 2021-08-30 NOTE — ED Notes (Signed)
[facility] arrived to transport pt. Pt stable at time of departure ?

## 2021-09-06 DIAGNOSIS — K589 Irritable bowel syndrome without diarrhea: Secondary | ICD-10-CM | POA: Diagnosis not present

## 2021-09-06 DIAGNOSIS — I130 Hypertensive heart and chronic kidney disease with heart failure and stage 1 through stage 4 chronic kidney disease, or unspecified chronic kidney disease: Secondary | ICD-10-CM | POA: Diagnosis not present

## 2021-09-06 DIAGNOSIS — N189 Chronic kidney disease, unspecified: Secondary | ICD-10-CM | POA: Diagnosis not present

## 2021-09-06 DIAGNOSIS — Z471 Aftercare following joint replacement surgery: Secondary | ICD-10-CM | POA: Diagnosis not present

## 2021-09-06 DIAGNOSIS — S79919A Unspecified injury of unspecified hip, initial encounter: Secondary | ICD-10-CM | POA: Diagnosis not present

## 2021-09-06 DIAGNOSIS — Z7902 Long term (current) use of antithrombotics/antiplatelets: Secondary | ICD-10-CM | POA: Diagnosis not present

## 2021-09-06 DIAGNOSIS — W19XXXA Unspecified fall, initial encounter: Secondary | ICD-10-CM | POA: Diagnosis not present

## 2021-09-06 DIAGNOSIS — M9702XA Periprosthetic fracture around internal prosthetic left hip joint, initial encounter: Secondary | ICD-10-CM | POA: Diagnosis not present

## 2021-09-06 DIAGNOSIS — E785 Hyperlipidemia, unspecified: Secondary | ICD-10-CM | POA: Diagnosis not present

## 2021-09-06 DIAGNOSIS — E1122 Type 2 diabetes mellitus with diabetic chronic kidney disease: Secondary | ICD-10-CM | POA: Diagnosis not present

## 2021-09-06 DIAGNOSIS — S73032A Other anterior subluxation of left hip, initial encounter: Secondary | ICD-10-CM | POA: Diagnosis not present

## 2021-09-06 DIAGNOSIS — E039 Hypothyroidism, unspecified: Secondary | ICD-10-CM | POA: Diagnosis not present

## 2021-09-06 DIAGNOSIS — I509 Heart failure, unspecified: Secondary | ICD-10-CM | POA: Diagnosis not present

## 2021-09-06 DIAGNOSIS — T84021A Dislocation of internal left hip prosthesis, initial encounter: Secondary | ICD-10-CM | POA: Diagnosis not present

## 2021-09-06 DIAGNOSIS — Z96642 Presence of left artificial hip joint: Secondary | ICD-10-CM | POA: Diagnosis not present

## 2021-09-08 ENCOUNTER — Other Ambulatory Visit: Payer: Self-pay | Admitting: Family Medicine

## 2021-09-08 DIAGNOSIS — E039 Hypothyroidism, unspecified: Secondary | ICD-10-CM

## 2021-09-09 NOTE — Telephone Encounter (Signed)
1st no show, fee waived ?

## 2021-09-10 ENCOUNTER — Other Ambulatory Visit: Payer: Self-pay | Admitting: Cardiology

## 2021-09-10 DIAGNOSIS — E7800 Pure hypercholesterolemia, unspecified: Secondary | ICD-10-CM

## 2021-09-13 ENCOUNTER — Ambulatory Visit: Payer: Medicare HMO | Admitting: Cardiovascular Disease

## 2021-09-17 ENCOUNTER — Other Ambulatory Visit: Payer: Self-pay | Admitting: Internal Medicine

## 2021-09-19 ENCOUNTER — Encounter: Payer: Medicare HMO | Admitting: Internal Medicine

## 2021-09-27 ENCOUNTER — Encounter (HOSPITAL_BASED_OUTPATIENT_CLINIC_OR_DEPARTMENT_OTHER): Payer: Self-pay

## 2021-09-27 ENCOUNTER — Emergency Department (HOSPITAL_BASED_OUTPATIENT_CLINIC_OR_DEPARTMENT_OTHER)
Admission: EM | Admit: 2021-09-27 | Discharge: 2021-09-28 | Disposition: A | Discharge status: Other Acute Inpatient Hospital | Payer: Medicare HMO | Attending: Emergency Medicine | Admitting: Emergency Medicine

## 2021-09-27 ENCOUNTER — Other Ambulatory Visit: Payer: Self-pay

## 2021-09-27 ENCOUNTER — Emergency Department (HOSPITAL_BASED_OUTPATIENT_CLINIC_OR_DEPARTMENT_OTHER): Payer: Medicare HMO

## 2021-09-27 DIAGNOSIS — I10 Essential (primary) hypertension: Secondary | ICD-10-CM | POA: Diagnosis not present

## 2021-09-27 DIAGNOSIS — Z87891 Personal history of nicotine dependence: Secondary | ICD-10-CM | POA: Diagnosis not present

## 2021-09-27 DIAGNOSIS — Z79899 Other long term (current) drug therapy: Secondary | ICD-10-CM | POA: Diagnosis not present

## 2021-09-27 DIAGNOSIS — X58XXXA Exposure to other specified factors, initial encounter: Secondary | ICD-10-CM | POA: Insufficient documentation

## 2021-09-27 DIAGNOSIS — Z7901 Long term (current) use of anticoagulants: Secondary | ICD-10-CM | POA: Insufficient documentation

## 2021-09-27 DIAGNOSIS — T84021A Dislocation of internal left hip prosthesis, initial encounter: Secondary | ICD-10-CM | POA: Diagnosis not present

## 2021-09-27 DIAGNOSIS — S79912A Unspecified injury of left hip, initial encounter: Secondary | ICD-10-CM | POA: Diagnosis present

## 2021-09-27 DIAGNOSIS — I2510 Atherosclerotic heart disease of native coronary artery without angina pectoris: Secondary | ICD-10-CM | POA: Diagnosis not present

## 2021-09-27 DIAGNOSIS — S73005A Unspecified dislocation of left hip, initial encounter: Secondary | ICD-10-CM | POA: Insufficient documentation

## 2021-09-27 LAB — BASIC METABOLIC PANEL
Anion gap: 9 (ref 5–15)
BUN: 14 mg/dL (ref 6–20)
CO2: 26 mmol/L (ref 22–32)
Calcium: 9.8 mg/dL (ref 8.9–10.3)
Chloride: 102 mmol/L (ref 98–111)
Creatinine, Ser: 1.2 mg/dL (ref 0.61–1.24)
GFR, Estimated: >60 mL/min (ref >60)
Glucose, Bld: 101 mg/dL — ABNORMAL HIGH (ref 70–99)
Potassium: 3.9 mmol/L (ref 3.5–5.1)
Sodium: 137 mmol/L (ref 135–145)

## 2021-09-27 LAB — CBC WITH DIFFERENTIAL/PLATELET
Abs Immature Granulocytes: 0.05 10*3/uL (ref 0.00–0.07)
Basophils Absolute: 0.1 10*3/uL (ref 0.0–0.1)
Basophils Relative: 1 %
Eosinophils Absolute: 0.1 10*3/uL (ref 0.0–0.5)
Eosinophils Relative: 1 %
HCT: 32.8 % — ABNORMAL LOW (ref 39.0–52.0)
Hemoglobin: 10.6 g/dL — ABNORMAL LOW (ref 13.0–17.0)
Immature Granulocytes: 0 %
Lymphocytes Relative: 17 %
Lymphs Abs: 2 10*3/uL (ref 0.7–4.0)
MCH: 24.7 pg — ABNORMAL LOW (ref 26.0–34.0)
MCHC: 32.3 g/dL (ref 30.0–36.0)
MCV: 76.3 fL — ABNORMAL LOW (ref 80.0–100.0)
Monocytes Absolute: 1.3 10*3/uL — ABNORMAL HIGH (ref 0.1–1.0)
Monocytes Relative: 11 %
Neutro Abs: 8.5 10*3/uL — ABNORMAL HIGH (ref 1.7–7.7)
Neutrophils Relative %: 70 %
Platelets: 318 10*3/uL (ref 150–400)
RBC: 4.3 MIL/uL (ref 4.22–5.81)
RDW: 18 % — ABNORMAL HIGH (ref 11.5–15.5)
WBC: 12 10*3/uL — ABNORMAL HIGH (ref 4.0–10.5)
nRBC: 0 % (ref 0.0–0.2)

## 2021-09-27 LAB — PROTIME-INR
INR: 1.5 — ABNORMAL HIGH (ref 0.8–1.2)
Prothrombin Time: 17.5 seconds — ABNORMAL HIGH (ref 11.4–15.2)

## 2021-09-27 LAB — C-REACTIVE PROTEIN: CRP: 2.8 mg/dL — ABNORMAL HIGH (ref <1.0)

## 2021-09-27 LAB — SEDIMENTATION RATE: Sed Rate: 10 mm/hr (ref 0–16)

## 2021-09-27 MED ORDER — SACUBITRIL-VALSARTAN 24-26 MG PO TABS
1.0000 | ORAL_TABLET | Freq: Two times a day (BID) | ORAL | Status: DC
  Filled 2021-09-27: qty 1

## 2021-09-27 MED ORDER — POTASSIUM CHLORIDE CRYS ER 20 MEQ PO TBCR
20.0000 meq | EXTENDED_RELEASE_TABLET | Freq: Every day | ORAL | Status: DC
  Administered 2021-09-27: 20 meq via ORAL
  Filled 2021-09-27: qty 1

## 2021-09-27 MED ORDER — FUROSEMIDE 40 MG PO TABS: 40.0000 mg | ORAL_TABLET | Freq: Every day | ORAL | Status: DC

## 2021-09-27 MED ORDER — EZETIMIBE 10 MG PO TABS
10.0000 mg | ORAL_TABLET | Freq: Every day | ORAL | Status: DC
  Filled 2021-09-27: qty 1

## 2021-09-27 MED ORDER — HYDROMORPHONE HCL 1 MG/ML IJ SOLN
1.0000 mg | Freq: Once | INTRAMUSCULAR | Status: DC
  Filled 2021-09-27: qty 1

## 2021-09-27 MED ORDER — HYDROMORPHONE HCL 1 MG/ML IJ SOLN
1.0000 mg | Freq: Once | INTRAMUSCULAR | Status: AC
  Administered 2021-09-27: 1 mg via INTRAVENOUS
  Filled 2021-09-27: qty 1

## 2021-09-27 MED ORDER — ISOSORBIDE MONONITRATE 15 MG HALF TABLET
15.0000 mg | ORAL_TABLET | Freq: Every day | ORAL | Status: DC
  Filled 2021-09-27: qty 1

## 2021-09-27 MED ORDER — HYDROMORPHONE HCL 1 MG/ML IJ SOLN
1.0000 mg | INTRAMUSCULAR | Status: DC | PRN
  Administered 2021-09-27 – 2021-09-28 (×5): 1 mg via INTRAVENOUS
  Filled 2021-09-27 (×4): qty 1

## 2021-09-27 MED ORDER — FENTANYL CITRATE PF 50 MCG/ML IJ SOSY
50.0000 ug | PREFILLED_SYRINGE | Freq: Once | INTRAMUSCULAR | Status: AC
  Administered 2021-09-27: 50 ug via INTRAVENOUS
  Filled 2021-09-27: qty 1

## 2021-09-27 MED ORDER — CARVEDILOL 6.25 MG PO TABS: 3.1250 mg | ORAL_TABLET | Freq: Two times a day (BID) | ORAL | Status: DC

## 2021-09-27 MED ORDER — ACETAMINOPHEN 325 MG PO TABS: 650.0000 mg | ORAL_TABLET | ORAL | Status: DC | PRN

## 2021-09-27 MED ORDER — ATORVASTATIN CALCIUM 40 MG PO TABS
80.0000 mg | ORAL_TABLET | Freq: Every day | ORAL | Status: DC
  Administered 2021-09-27: 80 mg via ORAL
  Filled 2021-09-27: qty 2

## 2021-09-27 MED ORDER — PANTOPRAZOLE SODIUM 40 MG PO TBEC: 40.0000 mg | DELAYED_RELEASE_TABLET | ORAL | Status: DC | PRN

## 2021-09-27 MED ORDER — LEVOTHYROXINE SODIUM 25 MCG PO TABS
50.0000 ug | ORAL_TABLET | Freq: Every day | ORAL | Status: DC
  Administered 2021-09-28: 50 ug via ORAL
  Filled 2021-09-27: qty 2

## 2021-09-27 NOTE — ED Notes (Addendum)
Called [facility] dr Daiva Huge at [phone] and left a vm for a ret call  ;no ret call- called again

## 2021-09-27 NOTE — ED Provider Notes (Signed)
[facility] EMERGENCY DEPT Provider Note   CSN: [id number] Arrival date & time: 09/27/21  1040     History  Chief Complaint  Patient presents with   Hip Injury    Billy [name] is a 56 y.o. male with recent history of left THA on 08/22/21 who presents to the emergency department for probable left hip dislocation.  Since his surgery, patient has had multiple hip dislocations requiring relocation.  Most recently, this occurred 20 days ago on 09/06/2021.  Patient states that yesterday he felt "clicking" in the anterior portion of his hip.  When he woke up this morning and swelling his lungs around the side of the bed, he felt the left hip dislocate.  Since then, he has been nonweightbearing and primarily transporting via wheelchair and occasionally with his walker.  Patient wears an abduction brace of the left hip and states that he is very compliant about using this except when changing.  Patient called his surgeon, Dr. Jaclynn Guarneri, with [facility] orthopedics this morning who recommended that he come to the emergency department for x-rays and evaluation.  Patient denies numbness, tingling.  He has no systemic complaints. HPI     Home Medications Prior to Admission medications   Medication Sig Start Date End Date Taking? Authorizing Provider  atorvastatin (LIPITOR) 80 MG tablet TAKE 1 TABLET EVERY DAY 07/06/21  Yes Deboraha Sprang, MD  carvedilol (COREG) 3.125 MG tablet Take 1 tablet (3.125 mg total) by mouth 2 (two) times daily with a meal. 08/08/21  Yes Lelon Perla, MD  clopidogrel (PLAVIX) 75 MG tablet TAKE 1 TABLET EVERY DAY 07/06/21  Yes Deboraha Sprang, MD  ELIQUIS 5 MG TABS tablet TAKE 1 TABLET (5 MG TOTAL) BY MOUTH 2 (TWO) TIMES DAILY. 07/06/21  Yes Lelon Perla, MD  ezetimibe (ZETIA) 10 MG tablet TAKE 1 TABLET EVERY DAY 09/12/21  Yes Lelon Perla, MD  furosemide (LASIX) 40 MG tablet TAKE 1 TABLET EVERY DAY Patient taking differently: Take 40 mg by mouth daily.  06/29/21  Yes Lelon Perla, MD  isosorbide mononitrate (IMDUR) 30 MG 24 hr tablet Take 0.5 tablets (15 mg total) by mouth daily. 07/09/21  Yes Adhikari, Tamsen Meek, MD  montelukast (SINGULAIR) 10 MG tablet TAKE 1 TABLET EVERY DAY AS NEEDED FOR ALLERGIES Patient taking differently: Take 10 mg by mouth daily. 09/16/20  Yes Minette Brine, Amy J, NP  polyethylene glycol (MIRALAX / GLYCOLAX) 17 g packet Take 17 g by mouth daily. 07/09/21  Yes Shelly Coss, MD  potassium chloride SA (KLOR-CON M) 20 MEQ tablet TAKE 1 TABLET EVERY DAY Patient taking differently: Take 20 mEq by mouth daily. 05/23/21  Yes Lelon Perla, MD  sacubitril-valsartan (ENTRESTO) 24-26 MG TAKE 1 TABLET TWICE DAILY 07/06/21  Yes Deboraha Sprang, MD  cholecalciferol (VITAMIN D) 25 MCG tablet Take 2 tablets (2,000 Units total) by mouth 2 (two) times daily. Patient not taking: Reported on 09/27/2021 07/08/21   Shelly Coss, MD  Docusate Sodium (DSS) 100 MG CAPS TAKE 1 CAPSULE BY MOUTH 2 TIMES DAILY FOR 14 DAYS. 07/08/21   [provider]  fluticasone (FLONASE) 50 MCG/ACT nasal spray Place 2 sprays into both nostrils daily as needed for allergies or rhinitis. Patient not taking: Reported on 09/27/2021 06/11/19   Nicolette Bang, MD  levothyroxine (SYNTHROID) 50 MCG tablet TAKE 1 TABLET (50 MCG TOTAL) BY MOUTH DAILY BEFORE BREAKFAST. [place]. 09/08/21   [name], MD  methocarbamol 1000  MG TABS Take 1,000 mg by mouth every 8 (eight) hours as needed for muscle spasms. Patient not taking: Reported on 09/27/2021 07/08/21   Shelly Coss, MD  nitroGLYCERIN (NITROSTAT) 0.4 MG SL tablet DISSOLVE 1 TABLET UNDER THE TONGUE EVERY 5 MINUTES FOR 3 DOSES AS NEEDED FOR CHEST PAIN 08/15/21   Lelon Perla, MD  pantoprazole (PROTONIX) 40 MG tablet TAKE 1 TABLET EVERY DAY 08/08/21   Lelon Perla, MD      Allergies    Patient has no known allergies.    Review of Systems   Review of  Systems  Physical Exam Updated Vital Signs BP 113/77   Pulse 93   Temp 98.8 F (37.1 C)   Resp 16   Ht '5\' 10"'$  (1.778 m)   Wt 117.5 kg   SpO2 95%   BMI 37.17 kg/m  Physical Exam Vitals and nursing note reviewed.  Constitutional:      General: He is not in acute distress.    Appearance: He is not ill-appearing.  HENT:     Head: Atraumatic.  Eyes:     Conjunctiva/sclera: Conjunctivae normal.  Cardiovascular:     Rate and Rhythm: Normal rate and regular rhythm.     Pulses:          Radial pulses are 2+ on the right side and 2+ on the left side.       Dorsalis pedis pulses are 2+ on the right side and detected w/ Doppler on the left side.     Heart sounds: No murmur heard. Pulmonary:     Effort: Pulmonary effort is normal. No respiratory distress.     Breath sounds: Normal breath sounds.  Abdominal:     General: Abdomen is flat. There is no distension.     Palpations: Abdomen is soft.     Tenderness: There is no abdominal tenderness.  Musculoskeletal:        General: Normal range of motion.     Cervical back: Normal range of motion.     Right lower leg: No edema.     Left lower leg: No edema.     Comments: Tenderness to palpation over anterior left hip. No bruising or ecchymosis. Mild external rotation of left hip.  Surgical wound well healing without drainage or erythema  Skin:    General: Skin is warm and dry.     Capillary Refill: Capillary refill takes less than 2 seconds.  Neurological:     General: No focal deficit present.     Mental Status: He is alert.  Psychiatric:        Mood and Affect: Mood normal.     ED Results / Procedures / Treatments   Labs (all labs ordered are listed, but only abnormal results are displayed) Labs Reviewed  BASIC METABOLIC PANEL - Abnormal; Notable for the following components:      Result Value   Glucose, Bld 101 (*)    All other components within normal limits  CBC WITH DIFFERENTIAL/PLATELET - Abnormal; Notable for the  following components:   WBC 12.0 (*)    Hemoglobin 10.6 (*)    HCT 32.8 (*)    MCV 76.3 (*)    MCH 24.7 (*)    RDW 18.0 (*)    Neutro Abs 8.5 (*)    Monocytes Absolute 1.3 (*)    All other components within normal limits  PROTIME-INR - Abnormal; Notable for the following components:   Prothrombin Time 17.5 (*)  INR 1.5 (*)    All other components within normal limits  SEDIMENTATION RATE  C-REACTIVE PROTEIN    EKG None  Radiology DG Hip Unilat W or Wo Pelvis 2-3 Views Left  Result Date: 09/27/2021 CLINICAL DATA:  Recent history of dislocation in the left hip prosthesis EXAM: DG HIP (WITH OR WITHOUT PELVIS) 2-3V LEFT COMPARISON:  08/29/2021 FINDINGS: There is previous left hip arthroplasty. There is superior and lateral dislocation of femoral head prosthesis in relation to acetabular cup. There is indentation in the lateral cortical margin of left iliac bone superior to the acetabulum. This may be due to chronic or recurrent dislocations. No fracture line is seen. There is vascular stent in the left femoral vessels. IMPRESSION: Superior and lateral dislocation of femoral head prosthesis in relation to the acetabular cup. No fracture lines are seen. There is indentation in the lateral margin of left iliac bone superior to the left acetabulum which may suggest chronic or recurrent dislocation in the left hip prosthesis. Electronically Signed   By: Elmer Picker M.D.   On: 09/27/2021 12:55    Procedures Procedures    Medications Ordered in ED Medications  HYDROmorphone (DILAUDID) injection 1 mg (has no administration in time range)  fentaNYL (SUBLIMAZE) injection 50 mcg (50 mcg Intravenous Given 09/27/21 1310)  fentaNYL (SUBLIMAZE) injection 50 mcg (50 mcg Intravenous Given 09/27/21 1445)    ED Course/ Medical Decision Making/ A&P                           Medical Decision Making Amount and/or Complexity of Data Reviewed Labs: ordered. Radiology:  ordered.  Risk Prescription drug management.   Social determinants of health:  Social History   Socioeconomic History   Marital status: Married    Spouse name: Not on file   Number of children: 3   Years of education: 11   Highest education level: Not on file  Occupational History   Occupation: Disability  Tobacco Use   Smoking status: Former    Packs/day: 2.00    Years: 20.00    Total pack years: 40.00    Types: Cigarettes    Quit date: 04/04/2003    Years since quitting: 18.4    Passive exposure: Never   Smokeless tobacco: Never  Vaping Use   Vaping Use: Never used  Substance and Sexual Activity   Alcohol use: No   Drug use: No   Sexual activity: Yes    Partners: Female  Other Topics Concern   Not on file  Social History Narrative   MARRIED   FORMER TOBACCO USE, SMOKED FOR 20 YRS 2 PPD; QUIT IN 2005   NO ETOH   NO ILLICIT DRUG USE   NO REGULAR EXERCISE         ICD-ST. JUDE ; CERTIFIED LETTER SENT AND RETURNED BAD ADDRESS, PLS UPDATE DJW.      Fun: Fishing    Social Determinants of Health   Financial Resource Strain: Low Risk  (04/13/2021)   Overall Financial Resource Strain (CARDIA)    Difficulty of Paying Living Expenses: Not hard at all  Food Insecurity: No Food Insecurity (04/13/2021)   Hunger Vital Sign    Worried About Running Out of Food in the Last Year: Never true    Ran Out of Food in the Last Year: Never true  Transportation Needs: No Transportation Needs (04/13/2021)   PRAPARE - Hydrologist (Medical): No  Lack of Transportation (Non-Medical): No  Physical Activity: Insufficiently Active (04/13/2021)   Exercise Vital Sign    Days of Exercise per Week: 5 days    Minutes of Exercise per Session: 20 min  Stress: No Stress Concern Present (04/13/2021)   [place]    Feeling of Stress : Not at all  Social Connections: Moderately Integrated (04/13/2021)    Social Connection and Isolation Panel [NHANES]    Frequency of Communication with Friends and Family: Twice a week    Frequency of Social Gatherings with Friends and Family: Twice a week    Attends Religious Services: 1 to 4 times per year    Active Member of [employer] or Organizations: No    Attends Archivist Meetings: Never    Marital Status: Married  Human resources officer Violence: Not At Risk (04/13/2021)   Humiliation, Afraid, Rape, and Kick questionnaire    Fear of Current or Ex-Partner: No    Emotionally Abused: No    Physically Abused: No    Sexually Abused: No     Initial impression:  This patient presents to the ED for concern of possible hip dislocation, this involves an extensive number of treatment options, and is a complaint that carries with it a high risk of complications and morbidity.   Differentials include hip dislocation, fracture.   Comorbidities affecting care:  Previous history of cardiac arrest, hypertension, paroxysmal A-fib on Eliquis, PAD, CAD, previous MI  Additional history obtained: Orthopedic records from [place], ED records from recent hip dislocations following his surgery  Lab Tests  I Ordered, reviewed, and interpreted labs and EKG.  The pertinent results include:  BMP normal, leukocytosis 12 INR 1.5  Imaging Studies ordered:  I ordered imaging studies including  X-ray of the left hip which shows superior and lateral dislocation I independently visualized and interpreted imaging and I agree with the radiologist interpretation.    Medicines ordered and prescription drug management:  I ordered medication including: Fentanyl 50 mcg x 2 Dilaudid 1 mg Reevaluation of the patient after these medicines showed that the patient improved I have reviewed the patients home medicines and have made adjustments as needed  Consultations Obtained:  I requested consultation with [facility] orthopedics and spoke to Luz Lex, PA-C and Dr. Jaclynn Guarneri,   and discussed lab and imaging findings as well as pertinent plan - they recommend: Patient be transferred to [facility] for revision surgery.  Advise discontinuation of Eliquis and requests additional labs.   ED Course/Re-evaluation: 56 year old male presents to the emergency department for evaluation of a suspected left hip dislocation.  Vitals are without significant abnormality.  On exam, he has tenderness to palpation of the left anterior hip and external rotation of the left lower extremity.  Previous surgical wound from recent weeks is well-healing without obvious drainage or discharge.  X-ray confirmed superior and lateral dislocation of the left hip joint.  I called [facility] orthopedics and initially spoke to Luz Lex, PA-C who recommended either transport to [facility] or attempting here a hip reduction.  Shortly after, she called back saying that she had spoken to patient's orthopedic physician, Dr. Jaclynn Guarneri who would prefer patient to be transferred directly to [facility] for likely surgery.  I also called and talked to Dr. Jaclynn Guarneri directly who would prefer this as well.  They request we obtain basic labs including INR, inflammatory markers and CBC.  Advised to discontinue patient's Eliquis for pending surgery.  Patient's  pain managed here in the emergency department.  He had 2 doses of 50 mcg of fentanyl without significant improvement in pain and then was given Dilaudid 1 mg with improvement.  Patient is to be transferred over to [facility].  Family and patient expressed understanding are amenable to plan.  Disposition:  After consideration of the diagnostic results, physical exam, history and the patients response to treatment feel that the patent would benefit from transfer to [facility].   Left hip dislocation: Plan and management as described above. Discharged home in good condition. Final Clinical Impression(s) / ED Diagnoses Final diagnoses:  Dislocation of left hip, initial  encounter [facility])    Rx / DC Orders ED Discharge Orders     None         Tonye Pearson, [place] 09/27/21 1538    Tegeler, Gwenyth Allegra, MD 09/28/21 [phone]

## 2021-09-27 NOTE — ED Triage Notes (Signed)
Pt. States he was advised by ortho in [place] to come here for left  hip dislocation. Pt. States he feels like his hip is out of joint. States pain is 8/10. States he cannot stand on hip. States this has happened before.

## 2021-09-28 DIAGNOSIS — E039 Hypothyroidism, unspecified: Secondary | ICD-10-CM | POA: Diagnosis not present

## 2021-09-28 DIAGNOSIS — D62 Acute posthemorrhagic anemia: Secondary | ICD-10-CM | POA: Diagnosis not present

## 2021-09-28 DIAGNOSIS — R0600 Dyspnea, unspecified: Secondary | ICD-10-CM | POA: Diagnosis not present

## 2021-09-28 DIAGNOSIS — Z471 Aftercare following joint replacement surgery: Secondary | ICD-10-CM | POA: Diagnosis not present

## 2021-09-28 DIAGNOSIS — I959 Hypotension, unspecified: Secondary | ICD-10-CM | POA: Diagnosis not present

## 2021-09-28 DIAGNOSIS — N1831 Chronic kidney disease, stage 3a: Secondary | ICD-10-CM | POA: Diagnosis not present

## 2021-09-28 DIAGNOSIS — I2510 Atherosclerotic heart disease of native coronary artery without angina pectoris: Secondary | ICD-10-CM | POA: Diagnosis not present

## 2021-09-28 DIAGNOSIS — K219 Gastro-esophageal reflux disease without esophagitis: Secondary | ICD-10-CM | POA: Diagnosis not present

## 2021-09-28 DIAGNOSIS — R739 Hyperglycemia, unspecified: Secondary | ICD-10-CM | POA: Diagnosis not present

## 2021-09-28 DIAGNOSIS — I4891 Unspecified atrial fibrillation: Secondary | ICD-10-CM | POA: Diagnosis not present

## 2021-09-28 DIAGNOSIS — Z96642 Presence of left artificial hip joint: Secondary | ICD-10-CM | POA: Diagnosis not present

## 2021-09-28 DIAGNOSIS — N189 Chronic kidney disease, unspecified: Secondary | ICD-10-CM | POA: Diagnosis not present

## 2021-09-28 DIAGNOSIS — I5022 Chronic systolic (congestive) heart failure: Secondary | ICD-10-CM | POA: Diagnosis not present

## 2021-09-28 DIAGNOSIS — T84021A Dislocation of internal left hip prosthesis, initial encounter: Secondary | ICD-10-CM | POA: Diagnosis not present

## 2021-09-28 DIAGNOSIS — I10 Essential (primary) hypertension: Secondary | ICD-10-CM | POA: Diagnosis not present

## 2021-09-28 DIAGNOSIS — Z7901 Long term (current) use of anticoagulants: Secondary | ICD-10-CM | POA: Diagnosis not present

## 2021-09-28 DIAGNOSIS — E1122 Type 2 diabetes mellitus with diabetic chronic kidney disease: Secondary | ICD-10-CM | POA: Diagnosis not present

## 2021-09-28 DIAGNOSIS — I130 Hypertensive heart and chronic kidney disease with heart failure and stage 1 through stage 4 chronic kidney disease, or unspecified chronic kidney disease: Secondary | ICD-10-CM | POA: Diagnosis not present

## 2021-09-28 DIAGNOSIS — M7989 Other specified soft tissue disorders: Secondary | ICD-10-CM | POA: Diagnosis not present

## 2021-09-28 DIAGNOSIS — Z79899 Other long term (current) drug therapy: Secondary | ICD-10-CM | POA: Diagnosis not present

## 2021-09-28 DIAGNOSIS — Z87891 Personal history of nicotine dependence: Secondary | ICD-10-CM | POA: Diagnosis not present

## 2021-09-28 DIAGNOSIS — R0602 Shortness of breath: Secondary | ICD-10-CM | POA: Diagnosis not present

## 2021-09-28 DIAGNOSIS — I5020 Unspecified systolic (congestive) heart failure: Secondary | ICD-10-CM | POA: Diagnosis not present

## 2021-09-28 DIAGNOSIS — S73005A Unspecified dislocation of left hip, initial encounter: Secondary | ICD-10-CM | POA: Diagnosis not present

## 2021-09-28 DIAGNOSIS — I110 Hypertensive heart disease with heart failure: Secondary | ICD-10-CM | POA: Diagnosis not present

## 2021-09-28 DIAGNOSIS — E1151 Type 2 diabetes mellitus with diabetic peripheral angiopathy without gangrene: Secondary | ICD-10-CM | POA: Diagnosis not present

## 2021-09-28 NOTE — ED Notes (Signed)
Pt transferred to [facility]. Report given to [facility] transport.

## 2021-10-03 ENCOUNTER — Ambulatory Visit (INDEPENDENT_AMBULATORY_CARE_PROVIDER_SITE_OTHER): Payer: Medicare HMO

## 2021-10-03 DIAGNOSIS — I4901 Ventricular fibrillation: Secondary | ICD-10-CM

## 2021-10-06 LAB — CUP PACEART REMOTE DEVICE CHECK
Battery Remaining Longevity: 119 mo
Battery Remaining Percentage: 95.5 %
Battery Voltage: 3.1 V
Brady Statistic RV Percent Paced: 1 %
Date Time Interrogation Session: 20230705150552
HighPow Impedance: 43 Ohm
Implantable Lead Implant Date: 20101111
Implantable Lead Location: 753860
Implantable Lead Model: 7121
Implantable Pulse Generator Implant Date: 20230317
Lead Channel Impedance Value: 430 Ohm
Lead Channel Pacing Threshold Amplitude: 1 V
Lead Channel Pacing Threshold Pulse Width: 0.5 ms
Lead Channel Sensing Intrinsic Amplitude: 12 mV
Lead Channel Setting Pacing Amplitude: 2.5 V
Lead Channel Setting Pacing Pulse Width: 0.5 ms
Lead Channel Setting Sensing Sensitivity: 0.5 mV
Pulse Gen Serial Number: 210001302

## 2021-10-07 ENCOUNTER — Encounter: Payer: Medicare HMO | Admitting: Internal Medicine

## 2021-10-18 ENCOUNTER — Ambulatory Visit (INDEPENDENT_AMBULATORY_CARE_PROVIDER_SITE_OTHER): Payer: Medicare HMO | Admitting: Cardiovascular Disease

## 2021-10-18 ENCOUNTER — Encounter: Payer: Self-pay | Admitting: Cardiovascular Disease

## 2021-10-18 VITALS — BP 128/82 | HR 84 | Ht 70.0 in | Wt 266.2 lb

## 2021-10-18 DIAGNOSIS — I480 Paroxysmal atrial fibrillation: Secondary | ICD-10-CM | POA: Diagnosis not present

## 2021-10-18 DIAGNOSIS — E785 Hyperlipidemia, unspecified: Secondary | ICD-10-CM

## 2021-10-18 DIAGNOSIS — I739 Peripheral vascular disease, unspecified: Secondary | ICD-10-CM | POA: Diagnosis not present

## 2021-10-18 DIAGNOSIS — I25810 Atherosclerosis of coronary artery bypass graft(s) without angina pectoris: Secondary | ICD-10-CM | POA: Diagnosis not present

## 2021-10-18 DIAGNOSIS — I5022 Chronic systolic (congestive) heart failure: Secondary | ICD-10-CM | POA: Diagnosis not present

## 2021-10-18 NOTE — Patient Instructions (Signed)
Medication Instructions:  STOP the Plavix *If you need a refill on your cardiac medications before your next appointment, please call your pharmacy*   Lab Work: None ordered If you have labs (blood work) drawn today and your tests are completely normal, you will receive your results only by: [place] (if you have MyChart) OR A paper copy in the mail If you have any lab test that is abnormal or we need to change your treatment, we will call you to review the results.   Testing/Procedures: None ordered   Follow-Up: At [facility], you and your health needs are our priority.  As part of our continuing mission to provide you with exceptional heart care, we have created designated Provider Care Teams.  These Care Teams include your primary Cardiologist (physician) and Advanced Practice Providers (APPs -  Physician Assistants and Nurse Practitioners) who all work together to provide you with the care you need, when you need it.  We recommend signing up for the patient portal called "MyChart".  Sign up information is provided on this After Visit Summary.  MyChart is used to connect with patients for Virtual Visits (Telemedicine).  Patients are able to view lab/test results, encounter notes, upcoming appointments, etc.  Non-urgent messages can be sent to your provider as well.   To learn more about what you can do with MyChart, go to [email or web address].    Your next appointment:   6 month(s)  The format for your next appointment:   In Person  Provider:   Dr. Fletcher Anon  Important Information About Sugar

## 2021-10-18 NOTE — Progress Notes (Signed)
Cardiology Office Note   Date:  10/18/2021   ID:  Billy [name], DOB [birth year], MRN [id number]  PCP:  [name], MD  Cardiologist:  Dr. Stanford Breed  No chief complaint on file.     History of Present Illness: Billy [name] is a 56 y.o. male who is here today for follow-up visit regarding peripheral arterial disease.  The patient had previous cardiac arrest in November 2010.  He had previous CABG.  He had an ICD placement done.Other medical problems include hypertension, previous tobacco use , PAD and hyperlipidemia.  He had previous left SFA stent many years ago.    He had unstable angina in October 2020.  Cardiac catheterization showed severe native 2-vessel coronary artery disease.  SVG to RCA and SVG to OM 2 were known to be occluded.  LIMA to LAD was found to be atretic with competitive flow due to minimal disease in the native LAD.  SVG to first diagonal was patent.  There was 80% ostial left circumflex stenosis which was treated with scoring balloon angioplasty.    He had worsening right calf claudication in 2021.  Angiography was performed in February 2021 which showed  severe stenosis affecting the proximal right external iliac artery with occluded mid SFA with reconstitution distally via collaterals from the profunda.  I performed successful self-expanding stent placement to the right external iliac artery.  He had recent issues with recurrent ICD shocks in 2022.  He underwent cardiac catheterization in February 2022 which showed significant multivessel CAD with chronic occlusion of first diagonal, mild LAD disease, patent ostial left circumflex, chronic occlusion of the mid AV groove left circumflex with collaterals and chronically occluded right coronary artery with left-to-right collaterals.  LIMA to LAD was atretic as native LAD had no obstructive disease.  He has been doing reasonably well with no recent chest pain or shortness of breath.  He underwent left hip  surgery at [facility] which required revision twice.  He is still recovering from that.  He had blood loss anemia that required transfusion.  Both Eliquis and Plavix were resumed.  He denies leg claudication. Most recent Doppler study showed an ABI of 0.85 on the right and 0.82 on the left.  Right external iliac artery stent was patent with no significant restenosis.  Past Medical History:  Diagnosis Date   Aortic atherosclerosis ([place])    on CXR   Blood in stool    C. difficile colitis    a. remote hx 2010.   Cardiac arrest - ventricular fibrillation 02/11/2009   a. 02/2009 s/p St Jude ICD.   Chronic combined systolic and diastolic CHF (congestive heart failure) ([name])    Chronic kidney disease, stage 3a ([name])    Coronary artery disease    a. PCI to Cx age 59. b. CABGx4 in 2003. c. BMS to OM1 in 12/2007. d. PTCA to Cx 01/2019.   Former tobacco use    Hyperlipidemia    Hypertension    Ischemic cardiomyopathy    Ischemic cardiomyopathy    Marijuana abuse    Myocardial infarct ([place])    NON-ST-SEGMENT ELEVATION MI   Obesity    PAD (peripheral artery disease) ([name])    PAF (paroxysmal atrial fibrillation) ([place])    Pathologic fracture of left acetabulum 07/03/2021   PSVT (paroxysmal supraventricular tachycardia) ([name])    Ventricular tachyarrhythmia ([name])    Ventricular tachycardia ([place])     Past Surgical History:  Procedure Laterality Date   ABDOMINAL AORTOGRAM W/LOWER EXTREMITY  N/A 05/14/2019   Procedure: ABDOMINAL AORTOGRAM W/LOWER EXTREMITY;  Surgeon: Wellington Hampshire, MD;  Location: [place] [facility];  Service: Cardiovascular;  Laterality: N/A;   CARDIAC DEFIBRILLATOR PLACEMENT     STJ single chamber ICD implanted for secondary prevention   CIRCUMCISION     CORONARY ARTERY BYPASS GRAFT  06/17/01   X 4   CORONARY BALLOON ANGIOPLASTY N/A 01/22/2019   Procedure: CORONARY BALLOON ANGIOPLASTY;  Surgeon: Leonie Man, MD;  Location: [place] [facility];  Service: Cardiovascular;   Laterality: N/A;   HIP CLOSED REDUCTION Left 07/02/2021   Procedure: CLOSED REDUCTION HIP with placement of skeletal traction;  Surgeon: Tania Ade, MD;  Location: [place];  Service: Orthopedics;  Laterality: Left;   ICD GENERATOR CHANGEOUT N/A 06/17/2021   Procedure: ICD GENERATOR CHANGEOUT;  Surgeon: Deboraha Sprang, MD;  Location: [place] [facility];  Service: Cardiovascular;  Laterality: N/A;   LEFT HEART CATH AND CORS/GRAFTS ANGIOGRAPHY N/A 01/22/2019   Procedure: LEFT HEART CATH AND CORS/GRAFTS ANGIOGRAPHY;  Surgeon: Leonie Man, MD;  Location: [place] [facility];  Service: Cardiovascular;  Laterality: N/A;   LEFT HEART CATH AND CORS/GRAFTS ANGIOGRAPHY N/A 05/18/2020   Procedure: LEFT HEART CATH AND CORS/GRAFTS ANGIOGRAPHY;  Surgeon: Troy Sine, MD;  Location: [place] [facility];  Service: Cardiovascular;  Laterality: N/A;   OPEN REDUCTION INTERNAL FIXATION ACETABULUM FRACTURE POSTERIOR Left 07/04/2021   Procedure: OPEN REDUCTION INTERNAL FIXATION ACETABULUM FRACTURE POSTERIOR;  Surgeon: Altamese [place], MD;  Location: [place];  Service: Orthopedics;  Laterality: Left;   PERIPHERAL VASCULAR INTERVENTION Right 05/14/2019   Procedure: PERIPHERAL VASCULAR INTERVENTION;  Surgeon: Wellington Hampshire, MD;  Location: [place] [facility];  Service: Cardiovascular;  Laterality: Right;   SVT ABLATION N/A 06/21/2020   Procedure: SVT ABLATION;  Surgeon: Evans Lance, MD;  Location: [place] [facility];  Service: Cardiovascular;  Laterality: N/A;     Current Outpatient Medications  Medication Sig Dispense Refill   atorvastatin (LIPITOR) 80 MG tablet TAKE 1 TABLET EVERY DAY 90 tablet 3   carvedilol (COREG) 3.125 MG tablet Take 1 tablet (3.125 mg total) by mouth 2 (two) times daily with a meal. 180 tablet 1   clopidogrel (PLAVIX) 75 MG tablet TAKE 1 TABLET EVERY DAY 90 tablet 3   Docusate Sodium (DSS) 100 MG CAPS TAKE 1 CAPSULE BY MOUTH 2 TIMES DAILY FOR 14 DAYS.     ELIQUIS 5 MG TABS tablet TAKE 1  TABLET (5 MG TOTAL) BY MOUTH 2 (TWO) TIMES DAILY. 180 tablet 1   ezetimibe (ZETIA) 10 MG tablet TAKE 1 TABLET EVERY DAY 90 tablet 1   fluticasone (FLONASE) 50 MCG/ACT nasal spray Place 2 sprays into both nostrils daily as needed for allergies or rhinitis. 16 g 11   furosemide (LASIX) 40 MG tablet TAKE 1 TABLET EVERY DAY (Patient taking differently: Take 40 mg by mouth daily.) 90 tablet 3   gabapentin (NEURONTIN) 100 MG capsule Take 100 mg by mouth 3 (three) times daily.     isosorbide mononitrate (IMDUR) 30 MG 24 hr tablet Take 0.5 tablets (15 mg total) by mouth daily. 30 tablet 0   levothyroxine (SYNTHROID) 50 MCG tablet TAKE 1 TABLET (50 MCG TOTAL) BY MOUTH DAILY BEFORE BREAKFAST. [place]. 90 tablet 0   meloxicam (MOBIC) 7.5 MG tablet Take 7.5 mg by mouth daily.     montelukast (SINGULAIR) 10 MG tablet TAKE 1 TABLET EVERY DAY AS NEEDED FOR ALLERGIES (Patient taking  differently: Take 10 mg by mouth daily.) 90 tablet 1   nitroGLYCERIN (NITROSTAT) 0.4 MG SL tablet DISSOLVE 1 TABLET UNDER THE TONGUE EVERY 5 MINUTES FOR 3 DOSES AS NEEDED FOR CHEST PAIN 25 tablet 3   oxyCODONE (OXY IR/ROXICODONE) 5 MG immediate release tablet Take by mouth.     pantoprazole (PROTONIX) 40 MG tablet TAKE 1 TABLET EVERY DAY 90 tablet 1   polyethylene glycol (MIRALAX / GLYCOLAX) 17 g packet Take 17 g by mouth daily. 14 each 0   potassium chloride SA (KLOR-CON M) 20 MEQ tablet TAKE 1 TABLET EVERY DAY (Patient taking differently: Take 20 mEq by mouth daily.) 90 tablet 3   sacubitril-valsartan (ENTRESTO) 24-26 MG TAKE 1 TABLET TWICE DAILY 180 tablet 3   No current facility-administered medications for this visit.    Allergies:   Patient has no known allergies.    Social History:  The patient  reports that he quit smoking about 18 years ago. His smoking use included cigarettes. He has a 40.00 pack-year smoking history. He has never been exposed to tobacco smoke. He has never used smokeless  tobacco. He reports that he does not drink alcohol and does not use drugs.   Family History:  The patient's family history includes Heart attack in his father; Heart disease in his sister; Hypertension in his mother and another family member.    ROS:  Please see the history of present illness.   Otherwise, review of systems are positive for none.   All other systems are reviewed and negative.    PHYSICAL EXAM: VS:  BP 128/82   Pulse 84   Ht '5\' 10"'$  (1.778 m)   Wt 266 lb 3.2 oz (120.7 kg)   SpO2 98%   BMI 38.20 kg/m  , BMI Body mass index is 38.2 kg/m. GEN: Well nourished, well developed, in no acute distress  HEENT: normal  Neck: no JVD, carotid bruits, or masses Cardiac: RRR; no murmurs, rubs, or gallops,no edema  Respiratory:  clear to auscultation bilaterally, normal work of breathing GI: soft, nontender, nondistended, + BS MS: no deformity or atrophy  Skin: warm and dry, no rash Neuro:  Strength and sensation are intact Psych: euthymic mood, full affect   EKG:  EKG is not ordered today.   Recent Labs: 11/29/2020: ALT 38 07/03/2021: B Natriuretic Peptide 50.3 07/04/2021: Magnesium 1.8; TSH 1.936 09/27/2021: BUN 14; Creatinine, Ser 1.20; Hemoglobin 10.6; Platelets 318; Potassium 3.9; Sodium 137    Lipid Panel    Component Value Date/Time   CHOL 153 11/29/2020 1018   CHOL 119 10/09/2019 0959   TRIG 122.0 11/29/2020 1018   HDL 43.40 11/29/2020 1018   HDL 32 (L) 10/09/2019 0959   CHOLHDL 4 11/29/2020 1018   VLDL 24.4 11/29/2020 1018   LDLCALC 85 11/29/2020 1018   LDLCALC 74 10/09/2019 0959   LDLDIRECT 95.0 11/29/2020 1018      Wt Readings from Last 3 Encounters:  10/18/21 266 lb 3.2 oz (120.7 kg)  09/27/21 259 lb 0.7 oz (117.5 kg)  08/29/21 259 lb (117.5 kg)           No data to display            ASSESSMENT AND PLAN:  1.  Peripheral arterial disease right lower extremity claudication: Status post stent placement to the right external iliac artery  with .  He has known occlusion of the right SFA .  He currently has no claudication and most recent Doppler showed patent right  external iliac artery stent with no restenosis.  Repeat studies in March 2024.  Given that he had no recent stenting and he is on anticoagulation, I elected to discontinue clopidogrel.  He was significantly anemic post episode recently that required transfusion.  2.  Coronary artery disease involving native coronary arteries without angina: Recent catheterization showed no new obstructive disease.  I discontinued Plavix to minimize the risk of bleeding.  3.  Chronic systolic heart failure: He appears to be euvolemic and currently on optimal medical therapy.    4.  Hyperlipidemia: Continue treatment with atorvastatin and Zetia with a target LDL of less than 70.  5.  Paroxysmal atrial fibrillation: He appears to be in sinus rhythm.  He is currently on anticoagulation with Eliquis.  7.  Recurrent ICD shocks: Currently on amiodarone.  This seems to be stable.   Disposition: Follow-up with me in 6 months.  Signed,  [name], MD  10/18/2021 10:12 AM    [place]

## 2021-10-24 NOTE — Progress Notes (Signed)
Remote ICD transmission.   

## 2021-10-24 NOTE — Addendum Note (Signed)
Addended by: Douglass Rivers D on: 10/24/2021 06:00 PM   Modules accepted: Level of Service

## 2021-11-02 ENCOUNTER — Ambulatory Visit (INDEPENDENT_AMBULATORY_CARE_PROVIDER_SITE_OTHER): Payer: Medicare HMO

## 2021-11-02 DIAGNOSIS — I255 Ischemic cardiomyopathy: Secondary | ICD-10-CM

## 2021-11-02 LAB — CUP PACEART REMOTE DEVICE CHECK
Battery Remaining Longevity: 120 mo
Battery Remaining Percentage: 95.5 %
Battery Voltage: 3.07 V
Brady Statistic RV Percent Paced: 1 %
Date Time Interrogation Session: 20230801160559
HighPow Impedance: 49 Ohm
Implantable Lead Implant Date: 20101111
Implantable Lead Location: 753860
Implantable Lead Model: 7121
Implantable Pulse Generator Implant Date: 20230317
Lead Channel Impedance Value: 500 Ohm
Lead Channel Pacing Threshold Amplitude: 1 V
Lead Channel Pacing Threshold Pulse Width: 0.5 ms
Lead Channel Sensing Intrinsic Amplitude: 12 mV
Lead Channel Setting Pacing Amplitude: 2.5 V
Lead Channel Setting Pacing Pulse Width: 0.5 ms
Lead Channel Setting Sensing Sensitivity: 0.5 mV
Pulse Gen Serial Number: 210001302

## 2021-11-03 DIAGNOSIS — Z7901 Long term (current) use of anticoagulants: Secondary | ICD-10-CM | POA: Diagnosis not present

## 2021-11-03 DIAGNOSIS — Z471 Aftercare following joint replacement surgery: Secondary | ICD-10-CM | POA: Diagnosis not present

## 2021-11-03 DIAGNOSIS — Z09 Encounter for follow-up examination after completed treatment for conditions other than malignant neoplasm: Secondary | ICD-10-CM | POA: Diagnosis not present

## 2021-11-03 DIAGNOSIS — Z96642 Presence of left artificial hip joint: Secondary | ICD-10-CM | POA: Diagnosis not present

## 2021-11-18 ENCOUNTER — Other Ambulatory Visit: Payer: Self-pay | Admitting: Cardiology

## 2021-11-18 DIAGNOSIS — E7800 Pure hypercholesterolemia, unspecified: Secondary | ICD-10-CM

## 2021-11-25 NOTE — Progress Notes (Signed)
Remote ICD transmission.   

## 2021-12-06 NOTE — Progress Notes (Signed)
HPI: FU CAD. Patient suffered a cardiac arrest in November of 2010. Echocardiogram initially revealed an EF of 10-15% however followup echo showed an EF of 40-45. Given out of the hospital VF arrest a St. Jude single lead ICD was placed. Also had episode of atrial fibrillation November 2020 and dyspnea with Brilinta. Cardiac catheterization February 2022 showed old occlusion of the first diagonal, old occlusion of the mid circumflex but with good collateralization to the distal vessel, mid total occlusion of the right coronary artery with collateralization to the distal PDA and PLA, atretic LIMA to the LAD, patent SVG to the diagonal and occlusion of the saphenous vein graft to the OM 2 and of the SVG to distal right coronary artery.  Medical therapy recommended.   Last echocardiogram March 2023 showed ejection fraction 40 to 45%, mild left ventricular hypertrophy, grade 1 diastolic dysfunction.  Patient also with peripheral vascular disease followed by Dr. Fletcher Anon; he has had previous stent to the right external iliac artery.  Also with previous ablation of atrial tachycardia.  On amiodarone for ventricular tachycardia. ABIs March 2023 mild bilaterally.  Since I last saw him, he has some dyspnea on exertion.  No orthopnea, PND, pedal edema, chest pain or syncope.  Current Outpatient Medications  Medication Sig Dispense Refill   atorvastatin (LIPITOR) 80 MG tablet TAKE 1 TABLET EVERY DAY 90 tablet 3   carvedilol (COREG) 3.125 MG tablet Take 1 tablet (3.125 mg total) by mouth 2 (two) times daily with a meal. 180 tablet 1   ELIQUIS 5 MG TABS tablet TAKE 1 TABLET (5 MG TOTAL) BY MOUTH 2 (TWO) TIMES DAILY. 180 tablet 1   ezetimibe (ZETIA) 10 MG tablet TAKE 1 TABLET (10 MG TOTAL) BY MOUTH DAILY. 90 tablet 1   fluticasone (FLONASE) 50 MCG/ACT nasal spray Place 2 sprays into both nostrils daily as needed for allergies or rhinitis. 16 g 11   furosemide (LASIX) 40 MG tablet TAKE 1 TABLET EVERY DAY 90 tablet 3    isosorbide mononitrate (IMDUR) 30 MG 24 hr tablet Take 0.5 tablets (15 mg total) by mouth daily. 30 tablet 0   levothyroxine (SYNTHROID) 50 MCG tablet TAKE 1 TABLET (50 MCG TOTAL) BY MOUTH DAILY BEFORE BREAKFAST. [place]. 90 tablet 0   montelukast (SINGULAIR) 10 MG tablet TAKE 1 TABLET EVERY DAY AS NEEDED FOR ALLERGIES 90 tablet 1   nitroGLYCERIN (NITROSTAT) 0.4 MG SL tablet DISSOLVE 1 TABLET UNDER THE TONGUE EVERY 5 MINUTES FOR 3 DOSES AS NEEDED FOR CHEST PAIN 25 tablet 3   pantoprazole (PROTONIX) 40 MG tablet TAKE 1 TABLET EVERY DAY 90 tablet 1   polyethylene glycol (MIRALAX / GLYCOLAX) 17 g packet Take 17 g by mouth daily. (Patient taking differently: Take 17 g by mouth as needed for mild constipation.) 14 each 0   potassium chloride SA (KLOR-CON M) 20 MEQ tablet TAKE 1 TABLET EVERY DAY 90 tablet 3   sacubitril-valsartan (ENTRESTO) 24-26 MG TAKE 1 TABLET TWICE DAILY 180 tablet 3   No current facility-administered medications for this visit.     Past Medical History:  Diagnosis Date   Aortic atherosclerosis ([place])    on CXR   Blood in stool    C. difficile colitis    a. remote hx 2010.   Cardiac arrest - ventricular fibrillation 02/11/2009   a. 02/2009 s/p St Jude ICD.   Chronic combined systolic and diastolic CHF (congestive heart failure) ([name])    Chronic kidney disease,  stage 3a [facility])    Coronary artery disease    a. PCI to Cx age 63. b. CABGx4 in 2003. c. BMS to OM1 in 12/2007. d. PTCA to Cx 01/2019.   Former tobacco use    Hyperlipidemia    Hypertension    Ischemic cardiomyopathy    Marijuana abuse    Myocardial infarct ([place])    NON-ST-SEGMENT ELEVATION MI   Obesity    PAD (peripheral artery disease) ([name])    PAF (paroxysmal atrial fibrillation) ([place])    Pathologic fracture of left acetabulum 07/03/2021   PSVT (paroxysmal supraventricular tachycardia) ([name])    Ventricular tachycardia ([place])     Past Surgical History:  Procedure Laterality  Date   ABDOMINAL AORTOGRAM W/LOWER EXTREMITY N/A 05/14/2019   Procedure: ABDOMINAL AORTOGRAM W/LOWER EXTREMITY;  Surgeon: Wellington Hampshire, MD;  Location: [place] [facility];  Service: Cardiovascular;  Laterality: N/A;   CARDIAC DEFIBRILLATOR PLACEMENT     STJ single chamber ICD implanted for secondary prevention   CIRCUMCISION     CORONARY ARTERY BYPASS GRAFT  06/17/01   X 4   CORONARY BALLOON ANGIOPLASTY N/A 01/22/2019   Procedure: CORONARY BALLOON ANGIOPLASTY;  Surgeon: Leonie Man, MD;  Location: [place] [facility];  Service: Cardiovascular;  Laterality: N/A;   HIP CLOSED REDUCTION Left 07/02/2021   Procedure: CLOSED REDUCTION HIP with placement of skeletal traction;  Surgeon: Tania Ade, MD;  Location: [place];  Service: Orthopedics;  Laterality: Left;   ICD GENERATOR CHANGEOUT N/A 06/17/2021   Procedure: ICD GENERATOR CHANGEOUT;  Surgeon: Deboraha Sprang, MD;  Location: [place] [facility];  Service: Cardiovascular;  Laterality: N/A;   LEFT HEART CATH AND CORS/GRAFTS ANGIOGRAPHY N/A 01/22/2019   Procedure: LEFT HEART CATH AND CORS/GRAFTS ANGIOGRAPHY;  Surgeon: Leonie Man, MD;  Location: [place] [facility];  Service: Cardiovascular;  Laterality: N/A;   LEFT HEART CATH AND CORS/GRAFTS ANGIOGRAPHY N/A 05/18/2020   Procedure: LEFT HEART CATH AND CORS/GRAFTS ANGIOGRAPHY;  Surgeon: Troy Sine, MD;  Location: [place] [facility];  Service: Cardiovascular;  Laterality: N/A;   OPEN REDUCTION INTERNAL FIXATION ACETABULUM FRACTURE POSTERIOR Left 07/04/2021   Procedure: OPEN REDUCTION INTERNAL FIXATION ACETABULUM FRACTURE POSTERIOR;  Surgeon: Altamese , MD;  Location: [place];  Service: Orthopedics;  Laterality: Left;   PERIPHERAL VASCULAR INTERVENTION Right 05/14/2019   Procedure: PERIPHERAL VASCULAR INTERVENTION;  Surgeon: Wellington Hampshire, MD;  Location: [place] [facility];  Service: Cardiovascular;  Laterality: Right;   SVT ABLATION N/A 06/21/2020   Procedure: SVT ABLATION;   Surgeon: Evans Lance, MD;  Location: [place] [facility];  Service: Cardiovascular;  Laterality: N/A;    Social History   Socioeconomic History   Marital status: Married    Spouse name: Not on file   Number of children: 3   Years of education: 11   Highest education level: Not on file  Occupational History   Occupation: Disability  Tobacco Use   Smoking status: Former    Packs/day: 2.00    Years: 20.00    Total pack years: 40.00    Types: Cigarettes    Quit date: 04/04/2003    Years since quitting: 18.7    Passive exposure: Never   Smokeless tobacco: Never  Vaping Use   Vaping Use: Never used  Substance and Sexual Activity   Alcohol use: No   Drug use: No   Sexual activity: Yes    Partners: Female  Other Topics Concern   Not on file  Social History Narrative   MARRIED   FORMER TOBACCO USE, SMOKED FOR 20 YRS 2 PPD; QUIT IN 2005   NO ETOH   NO ILLICIT DRUG USE   NO REGULAR EXERCISE         ICD-ST. JUDE ; CERTIFIED LETTER SENT AND RETURNED BAD ADDRESS, PLS UPDATE DJW.      Fun: Fishing    Social Determinants of Health   Financial Resource Strain: Low Risk  (04/13/2021)   Overall Financial Resource Strain (CARDIA)    Difficulty of Paying Living Expenses: Not hard at all  Food Insecurity: No Food Insecurity (04/13/2021)   Hunger Vital Sign    Worried About Running Out of Food in the Last Year: Never true    Ran Out of Food in the Last Year: Never true  Transportation Needs: No Transportation Needs (04/13/2021)   PRAPARE - Hydrologist (Medical): No    Lack of Transportation (Non-Medical): No  Physical Activity: Insufficiently Active (04/13/2021)   Exercise Vital Sign    Days of Exercise per Week: 5 days    Minutes of Exercise per Session: 20 min  Stress: No Stress Concern Present (04/13/2021)   [place]    Feeling of Stress : Not at all  Social Connections: Moderately  Integrated (04/13/2021)   Social Connection and Isolation Panel [NHANES]    Frequency of Communication with Friends and Family: Twice a week    Frequency of Social Gatherings with Friends and Family: Twice a week    Attends Religious Services: 1 to 4 times per year    Active Member of [employer] or Organizations: No    Attends Archivist Meetings: Never    Marital Status: Married  Human resources officer Violence: Not At Risk (04/13/2021)   Humiliation, Afraid, Rape, and Kick questionnaire    Fear of Current or Ex-Partner: No    Emotionally Abused: No    Physically Abused: No    Sexually Abused: No    Family History  Problem Relation Age of Onset   Hypertension Mother    Heart attack Father        DIED AT 31 FROM MI   Heart disease Sister        CAD AND PREVIOUS CABG   Hypertension Other        IN MOST OF HIS SIBLINGS    ROS: Back pain but no fevers or chills, productive cough, hemoptysis, dysphasia, odynophagia, melena, hematochezia, dysuria, hematuria, rash, seizure activity, orthopnea, PND, pedal edema, claudication. Remaining systems are negative.  Physical Exam: Well-developed well-nourished in no acute distress.  Skin is warm and dry.  HEENT is normal.  Neck is supple.  Chest is clear to auscultation with normal expansion.  Cardiovascular exam is regular rate and rhythm.  Abdominal exam nontender or distended. No masses palpated. Extremities show no edema. neuro grossly intact  A/P  1 coronary artery disease-patient denies chest pain.  Continue present medications including statin and beta-blocker.  No aspirin given need for apixaban.  2 ischemic cardiomyopathy-continue Entresto and carvedilol (increase to 6.25 mg twice daily).  Add Jardiance 10 mg daily.  Check potassium and renal function in 1 week.  3 paroxysmal atrial fibrillation-patient is in sinus rhythm on examination today.  Continue apixaban and beta-blocker.  4 hypertension-blood pressure controlled.   Continue present medical regimen.  5 hyperlipidemia-continue statin.  Check lipids.  Recent liver functions normal.  6 prior ICD-Per electrophysiology.  7  history of ventricular tachycardia-continue amiodarone.  Patient had recent TSH and chest x-ray on remarkable.  Liver functions also normal.  8 history of SVT-patient has had previous ablation.  9 peripheral vascular disease-Per Dr. Fletcher Anon.  Kirk Ruths, MD

## 2021-12-08 ENCOUNTER — Other Ambulatory Visit: Payer: Self-pay | Admitting: Cardiology

## 2021-12-16 ENCOUNTER — Encounter: Payer: Self-pay | Admitting: Cardiology

## 2021-12-16 ENCOUNTER — Ambulatory Visit: Payer: Medicare HMO | Attending: Cardiology | Admitting: Cardiology

## 2021-12-16 VITALS — BP 130/60 | HR 86 | Ht 71.0 in | Wt 270.0 lb

## 2021-12-16 DIAGNOSIS — Z95810 Presence of automatic (implantable) cardiac defibrillator: Secondary | ICD-10-CM | POA: Diagnosis not present

## 2021-12-16 DIAGNOSIS — I739 Peripheral vascular disease, unspecified: Secondary | ICD-10-CM | POA: Diagnosis not present

## 2021-12-16 DIAGNOSIS — E785 Hyperlipidemia, unspecified: Secondary | ICD-10-CM | POA: Diagnosis not present

## 2021-12-16 DIAGNOSIS — I480 Paroxysmal atrial fibrillation: Secondary | ICD-10-CM

## 2021-12-16 DIAGNOSIS — I255 Ischemic cardiomyopathy: Secondary | ICD-10-CM | POA: Diagnosis not present

## 2021-12-16 DIAGNOSIS — I25810 Atherosclerosis of coronary artery bypass graft(s) without angina pectoris: Secondary | ICD-10-CM | POA: Diagnosis not present

## 2021-12-16 DIAGNOSIS — I5022 Chronic systolic (congestive) heart failure: Secondary | ICD-10-CM

## 2021-12-16 MED ORDER — EMPAGLIFLOZIN 10 MG PO TABS: 10.0000 mg | ORAL_TABLET | Freq: Every day | ORAL | 11 refills | Status: DC

## 2021-12-16 NOTE — Patient Instructions (Signed)
Medication Instructions:   START JARDIANCE 10 MG ONCE DAILY  *If you need a refill on your cardiac medications before your next appointment, please call your pharmacy*   Lab Work:  Your physician recommends that you return for lab work in:[facility]  If you have labs (blood work) drawn today and your tests are completely normal, you will receive your results only by: MyChart Message (if you have MyChart) OR A paper copy in the mail If you have any lab test that is abnormal or we need to change your treatment, we will call you to review the results   Follow-Up: At [facility], you and your health needs are our priority.  As part of our continuing mission to provide you with exceptional heart care, we have created designated Provider Care Teams.  These Care Teams include your primary Cardiologist (physician) and Advanced Practice Providers (APPs -  Physician Assistants and Nurse Practitioners) who all work together to provide you with the care you need, when you need it.  We recommend signing up for the patient portal called "MyChart".  Sign up information is provided on this After Visit Summary.  MyChart is used to connect with patients for Virtual Visits (Telemedicine).  Patients are able to view lab/test results, encounter notes, upcoming appointments, etc.  Non-urgent messages can be sent to your provider as well.   To learn more about what you can do with MyChart, go to [email or web address].    Your next appointment:   6 month(s)  The format for your next appointment:   In Person  Provider:   Kirk Ruths, MD

## 2021-12-23 ENCOUNTER — Ambulatory Visit: Payer: Medicare HMO | Attending: Cardiology

## 2021-12-23 DIAGNOSIS — E785 Hyperlipidemia, unspecified: Secondary | ICD-10-CM | POA: Diagnosis not present

## 2021-12-23 DIAGNOSIS — I25810 Atherosclerosis of coronary artery bypass graft(s) without angina pectoris: Secondary | ICD-10-CM | POA: Diagnosis not present

## 2021-12-23 LAB — LIPID PANEL
Chol/HDL Ratio: 3.7 ratio (ref 0.0–5.0)
Cholesterol, Total: 118 mg/dL (ref 100–199)
HDL: 32 mg/dL — ABNORMAL LOW (ref >39)
LDL Chol Calc (NIH): 65 mg/dL (ref 0–99)
Triglycerides: 113 mg/dL (ref 0–149)
VLDL Cholesterol Cal: 21 mg/dL (ref 5–40)

## 2021-12-23 LAB — BASIC METABOLIC PANEL
BUN/Creatinine Ratio: 13 (ref 9–20)
BUN: 16 mg/dL (ref 6–24)
CO2: 23 mmol/L (ref 20–29)
Calcium: 9.6 mg/dL (ref 8.7–10.2)
Chloride: 103 mmol/L (ref 96–106)
Creatinine, Ser: 1.22 mg/dL (ref 0.76–1.27)
Glucose: 95 mg/dL (ref 70–99)
Potassium: 4.4 mmol/L (ref 3.5–5.2)
Sodium: 138 mmol/L (ref 134–144)
eGFR: 70 mL/min/{1.73_m2} (ref >59)

## 2021-12-31 ENCOUNTER — Other Ambulatory Visit: Payer: Self-pay | Admitting: Family Medicine

## 2021-12-31 DIAGNOSIS — E039 Hypothyroidism, unspecified: Secondary | ICD-10-CM

## 2022-02-06 ENCOUNTER — Ambulatory Visit (INDEPENDENT_AMBULATORY_CARE_PROVIDER_SITE_OTHER): Payer: Medicare HMO

## 2022-02-06 DIAGNOSIS — I255 Ischemic cardiomyopathy: Secondary | ICD-10-CM

## 2022-02-08 LAB — CUP PACEART REMOTE DEVICE CHECK
Battery Remaining Longevity: 118 mo
Battery Remaining Percentage: 94 %
Battery Voltage: 3.02 V
Brady Statistic RV Percent Paced: 1 %
Date Time Interrogation Session: 20231107154410
HighPow Impedance: 52 Ohm
Implantable Lead Connection Status: 753985
Implantable Lead Implant Date: 20101111
Implantable Lead Location: 753860
Implantable Lead Model: 7121
Implantable Pulse Generator Implant Date: 20230317
Lead Channel Impedance Value: 530 Ohm
Lead Channel Pacing Threshold Amplitude: 1 V
Lead Channel Pacing Threshold Pulse Width: 0.5 ms
Lead Channel Sensing Intrinsic Amplitude: 12 mV
Lead Channel Setting Pacing Amplitude: 2.5 V
Lead Channel Setting Pacing Pulse Width: 0.5 ms
Lead Channel Setting Sensing Sensitivity: 0.5 mV
Pulse Gen Serial Number: 210001302
Zone Setting Status: 755011

## 2022-02-11 ENCOUNTER — Other Ambulatory Visit: Payer: Self-pay | Admitting: Cardiology

## 2022-02-15 ENCOUNTER — Other Ambulatory Visit: Payer: Self-pay | Admitting: Cardiology

## 2022-02-15 DIAGNOSIS — I480 Paroxysmal atrial fibrillation: Secondary | ICD-10-CM

## 2022-02-15 NOTE — Telephone Encounter (Signed)
Prescription refill request for Eliquis received. Indication: Afib  Last office visit: 12/16/21 ([place])  Scr: 1.22 (12/23/21)  Age: 56 Weight: 122.5kg  Appropriate dose and refill sent to requested pharmacy.

## 2022-02-26 ENCOUNTER — Other Ambulatory Visit: Payer: Self-pay | Admitting: Cardiology

## 2022-03-01 ENCOUNTER — Other Ambulatory Visit: Payer: Self-pay | Admitting: Cardiology

## 2022-03-15 NOTE — Progress Notes (Signed)
Remote ICD transmission.   

## 2022-03-15 NOTE — Addendum Note (Signed)
Addended by: Douglass Rivers D on: 03/15/2022 11:54 AM   Modules accepted: Level of Service

## 2022-03-16 ENCOUNTER — Ambulatory Visit (INDEPENDENT_AMBULATORY_CARE_PROVIDER_SITE_OTHER): Payer: Medicare HMO

## 2022-03-16 DIAGNOSIS — I255 Ischemic cardiomyopathy: Secondary | ICD-10-CM | POA: Diagnosis not present

## 2022-03-16 LAB — CUP PACEART REMOTE DEVICE CHECK
Battery Remaining Longevity: 116 mo
Battery Remaining Percentage: 93 %
Battery Voltage: 3.02 V
Brady Statistic RV Percent Paced: 1 %
Date Time Interrogation Session: 20231214075924
HighPow Impedance: 55 Ohm
Implantable Lead Connection Status: 753985
Implantable Lead Implant Date: 20101111
Implantable Lead Location: 753860
Implantable Lead Model: 7121
Implantable Pulse Generator Implant Date: 20230317
Lead Channel Impedance Value: 530 Ohm
Lead Channel Pacing Threshold Amplitude: 1 V
Lead Channel Pacing Threshold Pulse Width: 0.5 ms
Lead Channel Sensing Intrinsic Amplitude: 12 mV
Lead Channel Setting Pacing Amplitude: 2.5 V
Lead Channel Setting Pacing Pulse Width: 0.5 ms
Lead Channel Setting Sensing Sensitivity: 0.5 mV
Pulse Gen Serial Number: 210001302
Zone Setting Status: 755011

## 2022-04-02 ENCOUNTER — Other Ambulatory Visit: Payer: Self-pay | Admitting: Cardiovascular Disease

## 2022-04-05 ENCOUNTER — Other Ambulatory Visit: Payer: Self-pay | Admitting: Family Medicine

## 2022-04-05 DIAGNOSIS — E039 Hypothyroidism, unspecified: Secondary | ICD-10-CM

## 2022-04-11 NOTE — Progress Notes (Signed)
Remote ICD transmission.   

## 2022-04-17 ENCOUNTER — Ambulatory Visit (INDEPENDENT_AMBULATORY_CARE_PROVIDER_SITE_OTHER): Payer: Medicare HMO

## 2022-04-17 DIAGNOSIS — I255 Ischemic cardiomyopathy: Secondary | ICD-10-CM

## 2022-04-19 LAB — CUP PACEART REMOTE DEVICE CHECK
Battery Remaining Longevity: 115 mo
Battery Remaining Longevity: 115 mo
Battery Remaining Percentage: 92 %
Battery Remaining Percentage: 92 %
Battery Voltage: 3.01 V
Battery Voltage: 3.01 V
Brady Statistic RV Percent Paced: 1 %
Brady Statistic RV Percent Paced: 1 %
Date Time Interrogation Session: 20240116125639
Date Time Interrogation Session: 20240116130026
HighPow Impedance: 52 Ohm
HighPow Impedance: 52 Ohm
Implantable Lead Connection Status: 753985
Implantable Lead Connection Status: 753985
Implantable Lead Implant Date: 20101111
Implantable Lead Implant Date: 20101111
Implantable Lead Location: 753860
Implantable Lead Location: 753860
Implantable Lead Model: 7121
Implantable Lead Model: 7121
Implantable Pulse Generator Implant Date: 20230317
Implantable Pulse Generator Implant Date: 20230317
Lead Channel Impedance Value: 500 Ohm
Lead Channel Impedance Value: 500 Ohm
Lead Channel Pacing Threshold Amplitude: 1 V
Lead Channel Pacing Threshold Amplitude: 1 V
Lead Channel Pacing Threshold Pulse Width: 0.5 ms
Lead Channel Pacing Threshold Pulse Width: 0.5 ms
Lead Channel Sensing Intrinsic Amplitude: 12 mV
Lead Channel Sensing Intrinsic Amplitude: 12 mV
Lead Channel Setting Pacing Amplitude: 2.5 V
Lead Channel Setting Pacing Amplitude: 2.5 V
Lead Channel Setting Pacing Pulse Width: 0.5 ms
Lead Channel Setting Pacing Pulse Width: 0.5 ms
Lead Channel Setting Sensing Sensitivity: 0.5 mV
Lead Channel Setting Sensing Sensitivity: 0.5 mV
Pulse Gen Serial Number: 210001302
Pulse Gen Serial Number: 210001302
Zone Setting Status: 755011
Zone Setting Status: 755011

## 2022-04-24 ENCOUNTER — Ambulatory Visit (INDEPENDENT_AMBULATORY_CARE_PROVIDER_SITE_OTHER): Payer: Medicare HMO

## 2022-04-24 VITALS — Ht 71.0 in | Wt 270.0 lb

## 2022-04-24 DIAGNOSIS — Z0000 Encounter for general adult medical examination without abnormal findings: Secondary | ICD-10-CM

## 2022-04-24 NOTE — Patient Instructions (Signed)
Billy [name] , Thank you for taking time to come for your Medicare Wellness Visit. I appreciate your ongoing commitment to your health goals. Please review the following plan we discussed and let me know if I can assist you in the future.   These are the goals we discussed:  Goals      Weight (lb) < 200 lb (90.7 kg)     Exercise more, watch foods he eats         This is a list of the screening recommended for you and due dates:  Health Maintenance  Topic Date Due   Complete foot exam   Never done   Eye exam for diabetics  Never done   Yearly kidney health urinalysis for diabetes  Never done   Zoster (Shingles) Vaccine (1 of 2) Never done   Screening for Lung Cancer  Never done   Hemoglobin A1C  05/31/2021   Flu Shot  11/01/2021   Colon Cancer Screening  08/18/2022*   Yearly kidney function blood test for diabetes  12/24/2022   Medicare Annual Wellness Visit  04/25/2023   DTaP/Tdap/Td vaccine (2 - Td or Tdap) 11/01/2024   Hepatitis C Screening: USPSTF Recommendation to screen - Ages 45-79 yo.  Completed   HIV Screening  Completed   HPV Vaccine  Aged Out   COVID-19 Vaccine  Discontinued  *Topic was postponed. The date shown is not the original due date.    Advanced directives: no  Conditions/risks identified: none  Next appointment: Follow up in one year for your annual wellness visit 04/30/2023 '@[id number]'$  telephone  Preventive Care 40-64 Years, Male Preventive care refers to lifestyle choices and visits with your health care provider that can promote health and wellness. What does preventive care include? A yearly physical exam. This is also called an annual well check. Dental exams once or twice a year. Routine eye exams. Ask your health care provider how often you should have your eyes checked. Personal lifestyle choices, including: Daily care of your teeth and gums. Regular physical activity. Eating a healthy diet. Avoiding tobacco and drug use. Limiting alcohol  use. Practicing safe sex. Taking low-dose aspirin every day starting at age 46. What happens during an annual well check? The services and screenings done by your health care provider during your annual well check will depend on your age, overall health, lifestyle risk factors, and family history of disease. Counseling  Your health care provider may ask you questions about your: Alcohol use. Tobacco use. Drug use. Emotional well-being. Home and relationship well-being. Sexual activity. Eating habits. Work and work Statistician. Screening  You may have the following tests or measurements: Height, weight, and BMI. Blood pressure. Lipid and cholesterol levels. These may be checked every 5 years, or more frequently if you are over 47 years old. Skin check. Lung cancer screening. You may have this screening every year starting at age 27 if you have a 30-pack-year history of smoking and currently smoke or have quit within the past 15 years. Fecal occult blood test (FOBT) of the stool. You may have this test every year starting at age 62. Flexible sigmoidoscopy or colonoscopy. You may have a sigmoidoscopy every 5 years or a colonoscopy every 10 years starting at age 42. Prostate cancer screening. Recommendations will vary depending on your family history and other risks. Hepatitis C blood test. Hepatitis B blood test. Sexually transmitted disease (STD) testing. Diabetes screening. This is done by checking your blood sugar (glucose) after you have not  eaten for a while (fasting). You may have this done every 1-3 years. Discuss your test results, treatment options, and if necessary, the need for more tests with your health care provider. Vaccines  Your health care provider may recommend certain vaccines, such as: Influenza vaccine. This is recommended every year. Tetanus, diphtheria, and acellular pertussis (Tdap, Td) vaccine. You may need a Td booster every 10 years. Zoster vaccine. You may  need this after age 12. Pneumococcal 13-valent conjugate (PCV13) vaccine. You may need this if you have certain conditions and have not been vaccinated. Pneumococcal polysaccharide (PPSV23) vaccine. You may need one or two doses if you smoke cigarettes or if you have certain conditions. Talk to your health care provider about which screenings and vaccines you need and how often you need them. This information is not intended to replace advice given to you by your health care provider. Make sure you discuss any questions you have with your health care provider. Document Released: 04/16/2015 Document Revised: 12/08/2015 Document Reviewed: 01/19/2015 Elsevier Interactive Patient Education  2017 [place] Prevention in the Home Falls can cause injuries. They can happen to people of all ages. There are many things you can do to make your home safe and to help prevent falls. What can I do on the outside of my home? Regularly fix the edges of walkways and driveways and fix any cracks. Remove anything that might make you trip as you walk through a door, such as a raised step or threshold. Trim any bushes or trees on the path to your home. Use bright outdoor lighting. Clear any walking paths of anything that might make someone trip, such as rocks or tools. Regularly check to see if handrails are loose or broken. Make sure that both sides of any steps have handrails. Any raised decks and porches should have guardrails on the edges. Have any leaves, snow, or ice cleared regularly. Use sand or salt on walking paths during winter. Clean up any spills in your garage right away. This includes oil or grease spills. What can I do in the bathroom? Use night lights. Install grab bars by the toilet and in the tub and shower. Do not use towel bars as grab bars. Use non-skid mats or decals in the tub or shower. If you need to sit down in the shower, use a plastic, non-slip stool. Keep the floor dry.  Clean up any water that spills on the floor as soon as it happens. Remove soap buildup in the tub or shower regularly. Attach bath mats securely with double-sided non-slip rug tape. Do not have throw rugs and other things on the floor that can make you trip. What can I do in the bedroom? Use night lights. Make sure that you have a light by your bed that is easy to reach. Do not use any sheets or blankets that are too big for your bed. They should not hang down onto the floor. Have a firm chair that has side arms. You can use this for support while you get dressed. Do not have throw rugs and other things on the floor that can make you trip. What can I do in the kitchen? Clean up any spills right away. Avoid walking on wet floors. Keep items that you use a lot in easy-to-reach places. If you need to reach something above you, use a strong step stool that has a grab bar. Keep electrical cords out of the way. Do not use floor polish  or wax that makes floors slippery. If you must use wax, use non-skid floor wax. Do not have throw rugs and other things on the floor that can make you trip. What can I do with my stairs? Do not leave any items on the stairs. Make sure that there are handrails on both sides of the stairs and use them. Fix handrails that are broken or loose. Make sure that handrails are as long as the stairways. Check any carpeting to make sure that it is firmly attached to the stairs. Fix any carpet that is loose or worn. Avoid having throw rugs at the top or bottom of the stairs. If you do have throw rugs, attach them to the floor with carpet tape. Make sure that you have a light switch at the top of the stairs and the bottom of the stairs. If you do not have them, ask someone to add them for you. What else can I do to help prevent falls? Wear shoes that: Do not have high heels. Have rubber bottoms. Are comfortable and fit you well. Are closed at the toe. Do not wear sandals. If  you use a stepladder: Make sure that it is fully opened. Do not climb a closed stepladder. Make sure that both sides of the stepladder are locked into place. Ask someone to hold it for you, if possible. Clearly mark and make sure that you can see: Any grab bars or handrails. First and last steps. Where the edge of each step is. Use tools that help you move around (mobility aids) if they are needed. These include: Canes. Walkers. Scooters. Crutches. Turn on the lights when you go into a dark area. Replace any light bulbs as soon as they burn out. Set up your furniture so you have a clear path. Avoid moving your furniture around. If any of your floors are uneven, fix them. If there are any pets around you, be aware of where they are. Review your medicines with your doctor. Some medicines can make you feel dizzy. This can increase your chance of falling. Ask your doctor what other things that you can do to help prevent falls. This information is not intended to replace advice given to you by your health care provider. Make sure you discuss any questions you have with your health care provider. Document Released: 01/14/2009 Document Revised: 08/26/2015 Document Reviewed: 04/24/2014 Elsevier Interactive Patient Education  2017 [employer].

## 2022-04-24 NOTE — Progress Notes (Signed)
Virtual Visit via Telephone Note  I connected with  Billy [name] on 04/24/22 at 11:30 AM EST by telephone and verified that I am speaking with the correct person using two identifiers.  Location: Patient: home Provider: [facility] Persons participating in the virtual visit: patient/Nurse Health Advisor   I discussed the limitations, risks, security and privacy concerns of performing an evaluation and management service by telephone and the availability of in person appointments. The patient expressed understanding and agreed to proceed.  Interactive audio and video telecommunications were attempted between this nurse and patient, however failed, due to patient having technical difficulties OR patient did not have access to video capability.  We continued and completed visit with audio only.  Some vital signs may be absent or patient reported.   Roger Shelter, LPN  Subjective:   Billy [name] is a 57 y.o. male who presents for Medicare Annual/Subsequent preventive examination.  Review of Systems     Cardiac Risk Factors include: advanced age (>62mn, >>66women);diabetes mellitus;male gender;hypertension;obesity (BMI >30kg/m2)     Objective:    Today's Vitals   04/24/22 1115  Weight: 270 lb (122.5 kg)  Height: '5\' 11"'$  (1.803 m)   Body mass index is 37.66 kg/m.     04/24/2022   11:27 AM 09/27/2021   10:48 AM 08/29/2021    7:24 PM 07/02/2021    6:00 AM 07/02/2021   12:44 AM 06/17/2021    9:52 AM 04/13/2021    3:09 PM  Advanced Directives  Does Patient Have a Medical Advance Directive? No No No No No No Yes  Does patient want to make changes to medical advance directive?       No - Patient declined  Would patient like information on creating a medical advance directive? No - Patient declined No - Patient declined No - Patient declined No - Patient declined No - Guardian declined No - Patient declined     Current Medications (verified) Outpatient Encounter Medications as  of 04/24/2022  Medication Sig   apixaban (ELIQUIS) 5 MG TABS tablet TAKE 1 TABLET TWICE DAILY   atorvastatin (LIPITOR) 80 MG tablet TAKE 1 TABLET EVERY DAY   carvedilol (COREG) 3.125 MG tablet TAKE 1 TABLET BY MOUTH TWICE A DAY WITH A MEAL   clopidogrel (PLAVIX) 75 MG tablet Take 75 mg by mouth daily.   empagliflozin (JARDIANCE) 10 MG TABS tablet Take 1 tablet (10 mg total) by mouth daily.   ezetimibe (ZETIA) 10 MG tablet TAKE 1 TABLET (10 MG TOTAL) BY MOUTH DAILY.   fluticasone (FLONASE) 50 MCG/ACT nasal spray Place 2 sprays into both nostrils daily as needed for allergies or rhinitis.   furosemide (LASIX) 40 MG tablet TAKE 1 TABLET EVERY DAY   isosorbide mononitrate (IMDUR) 30 MG 24 hr tablet Take 0.5 tablets (15 mg total) by mouth daily.   levothyroxine (SYNTHROID) 50 MCG tablet TAKE 1 TABLET BY MOUTH DAILY BEFORE BREAKFAST. 30 MINUTES BEFORE HAVING BREAKFAST.   montelukast (SINGULAIR) 10 MG tablet TAKE 1 TABLET EVERY DAY AS NEEDED FOR ALLERGIES   pantoprazole (PROTONIX) 40 MG tablet TAKE 1 TABLET BY MOUTH EVERY DAY   potassium chloride SA (KLOR-CON M) 20 MEQ tablet TAKE 1 TABLET EVERY DAY   sacubitril-valsartan (ENTRESTO) 24-26 MG TAKE 1 TABLET TWICE DAILY   nitroGLYCERIN (NITROSTAT) 0.4 MG SL tablet DISSOLVE 1 TABLET UNDER THE TONGUE EVERY 5 MINUTES FOR 3 DOSES AS NEEDED FOR CHEST PAIN (Patient not taking: Reported on 04/24/2022)   polyethylene glycol (MIRALAX /  GLYCOLAX) 17 g packet Take 17 g by mouth daily. (Patient not taking: Reported on 04/24/2022)   No facility-administered encounter medications on file as of 04/24/2022.    Allergies (verified) Patient has no known allergies.   History: Past Medical History:  Diagnosis Date   Aortic atherosclerosis ([place])    on CXR   Blood in stool    C. difficile colitis    a. remote hx 2010.   Cardiac arrest - ventricular fibrillation 02/11/2009   a. 02/2009 s/p St Jude ICD.   Chronic combined systolic and diastolic CHF (congestive heart  failure) ([name])    Chronic kidney disease, stage 3a ([name])    Coronary artery disease    a. PCI to Cx age 80. b. CABGx4 in 2003. c. BMS to OM1 in 12/2007. d. PTCA to Cx 01/2019.   Former tobacco use    Hyperlipidemia    Hypertension    Ischemic cardiomyopathy    Marijuana abuse    Myocardial infarct ([place])    NON-ST-SEGMENT ELEVATION MI   Obesity    PAD (peripheral artery disease) ([name])    PAF (paroxysmal atrial fibrillation) ([place])    Pathologic fracture of left acetabulum 07/03/2021   PSVT (paroxysmal supraventricular tachycardia)    Ventricular tachycardia ([place])    Past Surgical History:  Procedure Laterality Date   ABDOMINAL AORTOGRAM W/LOWER EXTREMITY N/A 05/14/2019   Procedure: ABDOMINAL AORTOGRAM W/LOWER EXTREMITY;  Surgeon: Wellington Hampshire, MD;  Location: [place] [facility];  Service: Cardiovascular;  Laterality: N/A;   CARDIAC DEFIBRILLATOR PLACEMENT     STJ single chamber ICD implanted for secondary prevention   CIRCUMCISION     CORONARY ARTERY BYPASS GRAFT  06/17/01   X 4   CORONARY BALLOON ANGIOPLASTY N/A 01/22/2019   Procedure: CORONARY BALLOON ANGIOPLASTY;  Surgeon: Leonie Man, MD;  Location: [place] [facility] [facility];  Service: Cardiovascular;  Laterality: N/A;   HIP CLOSED REDUCTION Left 07/02/2021   Procedure: CLOSED REDUCTION HIP with placement of skeletal traction;  Surgeon: Tania Ade, MD;  Location: [place];  Service: Orthopedics;  Laterality: Left;   ICD GENERATOR CHANGEOUT N/A 06/17/2021   Procedure: ICD GENERATOR CHANGEOUT;  Surgeon: Deboraha Sprang, MD;  Location: [place] [facility];  Service: Cardiovascular;  Laterality: N/A;   LEFT HEART CATH AND CORS/GRAFTS ANGIOGRAPHY N/A 01/22/2019   Procedure: LEFT HEART CATH AND CORS/GRAFTS ANGIOGRAPHY;  Surgeon: Leonie Man, MD;  Location: [place] [facility];  Service: Cardiovascular;  Laterality: N/A;   LEFT HEART CATH AND CORS/GRAFTS ANGIOGRAPHY N/A 05/18/2020   Procedure: LEFT HEART CATH AND CORS/GRAFTS  ANGIOGRAPHY;  Surgeon: Troy Sine, MD;  Location: [place] [facility];  Service: Cardiovascular;  Laterality: N/A;   OPEN REDUCTION INTERNAL FIXATION ACETABULUM FRACTURE POSTERIOR Left 07/04/2021   Procedure: OPEN REDUCTION INTERNAL FIXATION ACETABULUM FRACTURE POSTERIOR;  Surgeon: Altamese , MD;  Location: [facility];  Service: Orthopedics;  Laterality: Left;   PERIPHERAL VASCULAR INTERVENTION Right 05/14/2019   Procedure: PERIPHERAL VASCULAR INTERVENTION;  Surgeon: Wellington Hampshire, MD;  Location: [place] [facility];  Service: Cardiovascular;  Laterality: Right;   SVT ABLATION N/A 06/21/2020   Procedure: SVT ABLATION;  Surgeon: Evans Lance, MD;  Location: [place] [facility];  Service: Cardiovascular;  Laterality: N/A;   Family History  Problem Relation Age of Onset   Hypertension Mother    Heart attack Father        DIED AT 9 FROM MI   Heart disease Sister  CAD AND PREVIOUS CABG   Hypertension Other        IN MOST OF HIS SIBLINGS   Social History   Socioeconomic History   Marital status: Married    Spouse name: Not on file   Number of children: 3   Years of education: 11   Highest education level: Not on file  Occupational History   Occupation: Disability  Tobacco Use   Smoking status: Former    Packs/day: 2.00    Years: 20.00    Total pack years: 40.00    Types: Cigarettes    Quit date: 04/04/2003    Years since quitting: 19.0    Passive exposure: Never   Smokeless tobacco: Never  Vaping Use   Vaping Use: Never used  Substance and Sexual Activity   Alcohol use: No   Drug use: No   Sexual activity: Yes    Partners: Female  Other Topics Concern   Not on file  Social History Narrative   MARRIED   FORMER TOBACCO USE, SMOKED FOR 20 YRS 2 PPD; QUIT IN 2005   NO ETOH   NO ILLICIT DRUG USE   NO REGULAR EXERCISE         ICD-ST. JUDE ; CERTIFIED LETTER SENT AND RETURNED BAD ADDRESS, PLS UPDATE DJW.      Fun: Fishing    Social Determinants of Health    Financial Resource Strain: Low Risk  (04/24/2022)   Overall Financial Resource Strain (CARDIA)    Difficulty of Paying Living Expenses: Not hard at all  Food Insecurity: No Food Insecurity (04/24/2022)   Hunger Vital Sign    Worried About Running Out of Food in the Last Year: Never true    Ran Out of Food in the Last Year: Never true  Transportation Needs: No Transportation Needs (04/24/2022)   PRAPARE - Hydrologist (Medical): No    Lack of Transportation (Non-Medical): No  Physical Activity: Sufficiently Active (04/24/2022)   Exercise Vital Sign    Days of Exercise per Week: 6 days    Minutes of Exercise per Session: 30 min  Stress: No Stress Concern Present (04/24/2022)   [place]    Feeling of Stress : Not at all  Social Connections: Moderately Integrated (04/24/2022)   Social Connection and Isolation Panel [NHANES]    Frequency of Communication with Friends and Family: More than three times a week    Frequency of Social Gatherings with Friends and Family: More than three times a week    Attends Religious Services: More than 4 times per year    Active Member of [employer] or Organizations: No    Attends Music therapist: Never    Marital Status: Married    Tobacco Counseling Counseling given: Not Answered   Clinical Intake:  Pre-visit preparation completed: Yes  Pain : No/denies pain     BMI - recorded: 37.66 Nutritional Status: BMI > 30  Obese Nutritional Risks: None Diabetes: Yes (no medication taking for this) CBG done?: No (televisit) Did pt. bring in CBG monitor from home?: No  How often do you need to have someone help you when you read instructions, pamphlets, or other written materials from your doctor or pharmacy?: 1 - Never  Diabetic?yes  Interpreter Needed?: No  Information entered by :: B.Sophiana Milanese,LPN   Activities of Daily Living    04/24/2022    11:28 AM 07/02/2021    6:00 AM  In  your present state of health, do you have any difficulty performing the following activities:  Hearing? 0 0  Vision? 0 0  Difficulty concentrating or making decisions? 0 0  Walking or climbing stairs? 0 0  Dressing or bathing? 0 0  Doing errands, shopping? 0 1  Preparing Food and eating ? N   Using the Toilet? N   In the past six months, have you accidently leaked urine? N   Do you have problems with loss of bowel control? N   Managing your Medications? N   Managing your Finances? N   Housekeeping or managing your Housekeeping? N     Patient Care Team: [name], MD as PCP - General (Family Medicine) Stanford Breed Denice Bors, MD as PCP - Cardiology (Cardiology) Deboraha Sprang, MD as PCP - Electrophysiology (Cardiology) Lavonna Monarch, MD (Inactive) as Consulting Physician (Dermatology)  Indicate any recent Medical Services you may have received from other than [facility] providers in the past year (date may be approximate).     Assessment:   This is a routine wellness examination for Billy [name].  Hearing/Vision screen Hearing Screening - Comments:: No problems Vision Screening - Comments:: Vision well  Dietary issues and exercise activities discussed: Current Exercise Habits: Home exercise routine, Type of exercise: walking, Time (Minutes): 30, Frequency (Times/Week): 6, Weekly Exercise (Minutes/Week): 180, Intensity: Mild, Exercise limited by: None identified   Goals Addressed             This Visit's Progress    Weight (lb) < 200 lb (90.7 kg)   270 lb (122.5 kg)    Exercise more, watch foods he eats        Depression Screen    04/24/2022   11:24 AM 08/17/2021   10:13 AM 04/13/2021    3:10 PM 04/13/2021    3:07 PM 03/01/2021    9:02 AM 11/29/2020    9:35 AM 04/12/2020    9:54 AM  PHQ 2/9 Scores  PHQ - 2 Score 0 0 0 0 0 0 0    Fall Risk    04/24/2022   11:20 AM 08/17/2021   10:13 AM 04/13/2021    3:10 PM 03/01/2021    9:02  AM 11/29/2020    9:35 AM  Fall Risk   Falls in the past year? 1 1 0 0 0  Number falls in past yr: 0 1 0 0   Injury with Fall? 0 1 0    Comment  Broken left hip     Risk for fall due to : No Fall Risks      Follow up Education provided;Falls prevention discussed  Falls evaluation completed      FALL RISK PREVENTION PERTAINING TO THE HOME:  Any stairs in or around the home? No  If so, are there any without handrails? No  Home free of loose throw rugs in walkways, pet beds, electrical cords, etc? No  Adequate lighting in your home to reduce risk of falls? Yes   ASSISTIVE DEVICES UTILIZED TO PREVENT FALLS:  Life alert? No  Use of a cane, walker or w/c? No  Grab bars in the bathroom? No  Shower chair or bench in shower? No  Elevated toilet seat or a handicapped toilet? No     04/24/2022   11:32 AM  6CIT Screen  What Year? 0 points  What month? 0 points  What time? 0 points  Count back from 20 0 points  Months in reverse 0 points  Repeat phrase  0 points  Total Score 0 points    Immunizations Immunization History  Administered Date(s) Administered   Influenza,inj,Quad PF,6+ Mos 12/10/2013, 03/01/2021   Moderna Sars-Covid-2 Vaccination 10/11/2019, 11/08/2019   PNEUMOCOCCAL CONJUGATE-20 03/01/2021   Tdap 11/02/2014    TDAP status: Up to date  Flu Vaccine status: Declined, Education has been provided regarding the importance of this vaccine but patient still declined. Advised may receive this vaccine at local pharmacy or Health Dept. Aware to provide a copy of the vaccination record if obtained from local pharmacy or Health Dept. Verbalized acceptance and understanding.  Pneumococcal vaccine status: Up to date  Covid-19 vaccine status: Completed vaccines  Qualifies for Shingles Vaccine? Yes   Zostavax completed No   Shingrix Completed?: No.    Education has been provided regarding the importance of this vaccine. Patient has been advised to call insurance company to  determine out of pocket expense if they have not yet received this vaccine. Advised may also receive vaccine at local pharmacy or Health Dept. Verbalized acceptance and understanding.  Screening Tests Health Maintenance  Topic Date Due   FOOT EXAM  Never done   OPHTHALMOLOGY EXAM  Never done   Diabetic kidney evaluation - Urine ACR  Never done   Zoster Vaccines- Shingrix (1 of 2) Never done   Lung Cancer Screening  Never done   HEMOGLOBIN A1C  05/31/2021   INFLUENZA VACCINE  11/01/2021   COLONOSCOPY (Pts 45-59yr Insurance coverage will need to be confirmed)  08/18/2022 (Originally 05/19/2011)   Diabetic kidney evaluation - eGFR measurement  12/24/2022   Medicare Annual Wellness (AWV)  04/25/2023   DTaP/Tdap/Td (2 - Td or Tdap) 11/01/2024   Hepatitis C Screening  Completed   HIV Screening  Completed   HPV VACCINES  Aged Out   COVID-19 Vaccine  Discontinued    Health Maintenance  Health Maintenance Due  Topic Date Due   FOOT EXAM  Never done   OPHTHALMOLOGY EXAM  Never done   Diabetic kidney evaluation - Urine ACR  Never done   Zoster Vaccines- Shingrix (1 of 2) Never done   Lung Cancer Screening  Never done   HEMOGLOBIN A1C  05/31/2021   INFLUENZA VACCINE  11/01/2021    Colorectal cancer screening: Type of screening: Cologuard. Completed 05/04/2021. Repeat every 3 years  Lung Cancer Screening: (Low Dose CT Chest recommended if Age 56-80years, 30 pack-year currently smoking OR have quit w/in 15years.) does not qualify.   Lung Cancer Screening Referral: no  Additional Screening:  Hepatitis C Screening: does not qualify; Completed no  Vision Screening: Recommended annual ophthalmology exams for early detection of glaucoma and other disorders of the eye. Is the patient up to date with their annual eye exam?  No  Who is the provider or what is the name of the office in which the patient attends annual eye exams? [facility]If pt is not established with a provider, would they  like to be referred to a provider to establish care? No .   Dental Screening: Recommended annual dental exams for proper oral hygiene  Community Resource Referral / Chronic Care Management: CRR required this visit?  No   CCM required this visit?  No      Plan:     I have personally reviewed and noted the following in the patient's chart:   Medical and social history Use of alcohol, tobacco or illicit drugs  Current medications and supplements including opioid prescriptions. Patient is not currently taking opioid  prescriptions. Functional ability and status Nutritional status Physical activity Advanced directives List of other physicians Hospitalizations, surgeries, and ER visits in previous 12 months Vitals Screenings to include cognitive, depression, and falls Referrals and appointments  In addition, I have reviewed and discussed with patient certain preventive protocols, quality metrics, and best practice recommendations. A written personalized care plan for preventive services as well as general preventive health recommendations were provided to patient.     Roger Shelter, LPN   09/18/4857   Nurse Notes: Pt will make eye appointment with [facility] within the next 2 weeks.

## 2022-05-08 NOTE — Progress Notes (Deleted)
Cardiology Office Note   Date:  05/08/2022   ID:  Billy [name], DOB 1966-03-15, MRN [id number]  PCP:  [name], MD  Cardiologist:  Dr. Stanford Breed  No chief complaint on file.     History of Present Illness: Billy [name] is a 57 y.o. male who is here today for follow-up visit regarding peripheral arterial disease.  The patient had previous cardiac arrest in November 2010.  He had previous CABG.  He had an ICD placement done.Other medical problems include hypertension, previous tobacco use , PAD and hyperlipidemia.  He had previous left SFA stent many years ago.    He had unstable angina in October 2020.  Cardiac catheterization showed severe native 2-vessel coronary artery disease.  SVG to RCA and SVG to OM 2 were known to be occluded.  LIMA to LAD was found to be atretic with competitive flow due to minimal disease in the native LAD.  SVG to first diagonal was patent.  There was 80% ostial left circumflex stenosis which was treated with scoring balloon angioplasty.    He had worsening right calf claudication in 2021.  Angiography was performed in February 2021 which showed  severe stenosis affecting the proximal right external iliac artery with occluded mid SFA with reconstitution distally via collaterals from the profunda.  I performed successful self-expanding stent placement to the right external iliac artery.  He had issues with recurrent ICD shocks in 2022.  He underwent cardiac catheterization in February 2022 which showed significant multivessel CAD with chronic occlusion of first diagonal, mild LAD disease, patent ostial left circumflex, chronic occlusion of the mid AV groove left circumflex with collaterals and chronically occluded right coronary artery with left-to-right collaterals.  LIMA to LAD was atretic as native LAD had no obstructive disease.  He has been doing reasonably well with no recent chest pain or shortness of breath.  He underwent left hip surgery at  [facility] which required revision twice.  He is still recovering from that.  He had blood loss anemia that required transfusion.  Both Eliquis and Plavix were resumed.  He denies leg claudication. Most recent Doppler study showed an ABI of 0.85 on the right and 0.82 on the left.  Right external iliac artery stent was patent with no significant restenosis.  Past Medical History:  Diagnosis Date   Aortic atherosclerosis ([place])    on CXR   Blood in stool    C. difficile colitis    a. remote hx 2010.   Cardiac arrest - ventricular fibrillation 02/11/2009   a. 02/2009 s/p St Jude ICD.   Chronic combined systolic and diastolic CHF (congestive heart failure) ([name])    Chronic kidney disease, stage 3a ([name])    Coronary artery disease    a. PCI to Cx age 59. b. CABGx4 in 2003. c. BMS to OM1 in 12/2007. d. PTCA to Cx 01/2019.   Former tobacco use    Hyperlipidemia    Hypertension    Ischemic cardiomyopathy    Marijuana abuse    Myocardial infarct ([place])    NON-ST-SEGMENT ELEVATION MI   Obesity    PAD (peripheral artery disease) ([name])    PAF (paroxysmal atrial fibrillation) ([place])    Pathologic fracture of left acetabulum 07/03/2021   PSVT (paroxysmal supraventricular tachycardia)    Ventricular tachycardia ([place])     Past Surgical History:  Procedure Laterality Date   ABDOMINAL AORTOGRAM W/LOWER EXTREMITY N/A 05/14/2019   Procedure: ABDOMINAL AORTOGRAM W/LOWER EXTREMITY;  Surgeon: [name]  A, MD;  Location: [place] [facility];  Service: Cardiovascular;  Laterality: N/A;   CARDIAC DEFIBRILLATOR PLACEMENT     STJ single chamber ICD implanted for secondary prevention   CIRCUMCISION     CORONARY ARTERY BYPASS GRAFT  06/17/01   X 4   CORONARY BALLOON ANGIOPLASTY N/A 01/22/2019   Procedure: CORONARY BALLOON ANGIOPLASTY;  Surgeon: Leonie Man, MD;  Location: [place] [facility];  Service: Cardiovascular;  Laterality: N/A;   HIP CLOSED REDUCTION Left 07/02/2021   Procedure: CLOSED REDUCTION HIP  with placement of skeletal traction;  Surgeon: Tania Ade, MD;  Location: [place];  Service: Orthopedics;  Laterality: Left;   ICD GENERATOR CHANGEOUT N/A 06/17/2021   Procedure: ICD GENERATOR CHANGEOUT;  Surgeon: Deboraha Sprang, MD;  Location: [place] [facility];  Service: Cardiovascular;  Laterality: N/A;   LEFT HEART CATH AND CORS/GRAFTS ANGIOGRAPHY N/A 01/22/2019   Procedure: LEFT HEART CATH AND CORS/GRAFTS ANGIOGRAPHY;  Surgeon: Leonie Man, MD;  Location: [place] [facility];  Service: Cardiovascular;  Laterality: N/A;   LEFT HEART CATH AND CORS/GRAFTS ANGIOGRAPHY N/A 05/18/2020   Procedure: LEFT HEART CATH AND CORS/GRAFTS ANGIOGRAPHY;  Surgeon: Troy Sine, MD;  Location: [place] [facility];  Service: Cardiovascular;  Laterality: N/A;   OPEN REDUCTION INTERNAL FIXATION ACETABULUM FRACTURE POSTERIOR Left 07/04/2021   Procedure: OPEN REDUCTION INTERNAL FIXATION ACETABULUM FRACTURE POSTERIOR;  Surgeon: Altamese Van Meter, MD;  Location: [place];  Service: Orthopedics;  Laterality: Left;   PERIPHERAL VASCULAR INTERVENTION Right 05/14/2019   Procedure: PERIPHERAL VASCULAR INTERVENTION;  Surgeon: Wellington Hampshire, MD;  Location: [place] [facility];  Service: Cardiovascular;  Laterality: Right;   SVT ABLATION N/A 06/21/2020   Procedure: SVT ABLATION;  Surgeon: Evans Lance, MD;  Location: [place] [facility];  Service: Cardiovascular;  Laterality: N/A;     Current Outpatient Medications  Medication Sig Dispense Refill   apixaban (ELIQUIS) 5 MG TABS tablet TAKE 1 TABLET TWICE DAILY 180 tablet 3   atorvastatin (LIPITOR) 80 MG tablet TAKE 1 TABLET EVERY DAY 90 tablet 3   carvedilol (COREG) 3.125 MG tablet TAKE 1 TABLET BY MOUTH TWICE A DAY WITH A MEAL 180 tablet 1   clopidogrel (PLAVIX) 75 MG tablet Take 75 mg by mouth daily.     empagliflozin (JARDIANCE) 10 MG TABS tablet Take 1 tablet (10 mg total) by mouth daily. 30 tablet 11   ezetimibe (ZETIA) 10 MG tablet TAKE 1 TABLET (10 MG TOTAL) BY  MOUTH DAILY. 90 tablet 1   fluticasone (FLONASE) 50 MCG/ACT nasal spray Place 2 sprays into both nostrils daily as needed for allergies or rhinitis. 16 g 11   furosemide (LASIX) 40 MG tablet TAKE 1 TABLET EVERY DAY 90 tablet 3   isosorbide mononitrate (IMDUR) 30 MG 24 hr tablet Take 0.5 tablets (15 mg total) by mouth daily. 30 tablet 0   levothyroxine (SYNTHROID) 50 MCG tablet TAKE 1 TABLET BY MOUTH DAILY BEFORE BREAKFAST. [place]. 90 tablet 0   montelukast (SINGULAIR) 10 MG tablet TAKE 1 TABLET EVERY DAY AS NEEDED FOR ALLERGIES 90 tablet 1   nitroGLYCERIN (NITROSTAT) 0.4 MG SL tablet DISSOLVE 1 TABLET UNDER THE TONGUE EVERY 5 MINUTES FOR 3 DOSES AS NEEDED FOR CHEST PAIN (Patient not taking: Reported on 04/24/2022) 25 tablet 3   pantoprazole (PROTONIX) 40 MG tablet TAKE 1 TABLET BY MOUTH EVERY DAY 90 tablet 3   polyethylene glycol (MIRALAX / GLYCOLAX) 17 g packet Take 17 g  by mouth daily. (Patient not taking: Reported on 04/24/2022) 14 each 0   potassium chloride SA (KLOR-CON M) 20 MEQ tablet TAKE 1 TABLET EVERY DAY 90 tablet 3   sacubitril-valsartan (ENTRESTO) 24-26 MG TAKE 1 TABLET TWICE DAILY 180 tablet 3   No current facility-administered medications for this visit.    Allergies:   Patient has no known allergies.    Social History:  The patient  reports that he quit smoking about 19 years ago. His smoking use included cigarettes. He has a 40.00 pack-year smoking history. He has never been exposed to tobacco smoke. He has never used smokeless tobacco. He reports that he does not drink alcohol and does not use drugs.   Family History:  The patient's family history includes Heart attack in his father; Heart disease in his sister; Hypertension in his mother and another family member.    ROS:  Please see the history of present illness.   Otherwise, review of systems are positive for none.   All other systems are reviewed and negative.    PHYSICAL EXAM: VS:  There  were no vitals taken for this visit. , BMI There is no height or weight on file to calculate BMI. GEN: Well nourished, well developed, in no acute distress  HEENT: normal  Neck: no JVD, carotid bruits, or masses Cardiac: RRR; no murmurs, rubs, or gallops,no edema  Respiratory:  clear to auscultation bilaterally, normal work of breathing GI: soft, nontender, nondistended, + BS MS: no deformity or atrophy  Skin: warm and dry, no rash Neuro:  Strength and sensation are intact Psych: euthymic mood, full affect   EKG:  EKG is not ordered today.   Recent Labs: 07/03/2021: B Natriuretic Peptide 50.3 07/04/2021: Magnesium 1.8; TSH 1.936 09/27/2021: Hemoglobin 10.6; Platelets 318 12/23/2021: BUN 16; Creatinine, Ser 1.22; Potassium 4.4; Sodium 138    Lipid Panel    Component Value Date/Time   CHOL 118 12/23/2021 0818   TRIG 113 12/23/2021 0818   HDL 32 (L) 12/23/2021 0818   CHOLHDL 3.7 12/23/2021 0818   CHOLHDL 4 11/29/2020 1018   VLDL 24.4 11/29/2020 1018   LDLCALC 65 12/23/2021 0818   LDLDIRECT 95.0 11/29/2020 1018      Wt Readings from Last 3 Encounters:  04/24/22 270 lb (122.5 kg)  12/16/21 270 lb (122.5 kg)  10/18/21 266 lb 3.2 oz (120.7 kg)           No data to display            ASSESSMENT AND PLAN:  1.  Peripheral arterial disease right lower extremity claudication: Status post stent placement to the right external iliac artery with .  He has known occlusion of the right SFA .  He currently has no claudication and most recent Doppler showed patent right external iliac artery stent with no restenosis.  Repeat studies in March 2024.  Given that he had no recent stenting and he is on anticoagulation, I elected to discontinue clopidogrel.  He was significantly anemic post episode recently that required transfusion.  2.  Coronary artery disease involving native coronary arteries without angina: Recent catheterization showed no new obstructive disease.  I discontinued  Plavix to minimize the risk of bleeding.  3.  Chronic systolic heart failure: He appears to be euvolemic and currently on optimal medical therapy.    4.  Hyperlipidemia: Continue treatment with atorvastatin and Zetia with a target LDL of less than 70.  5.  Paroxysmal atrial fibrillation: He appears to be  in sinus rhythm.  He is currently on anticoagulation with Eliquis.  7.  Recurrent ICD shocks: Currently on amiodarone.  This seems to be stable.   Disposition: Follow-up with me in 6 months.  Signed,  [name], MD  05/08/2022 5:09 PM    [place]

## 2022-05-09 ENCOUNTER — Ambulatory Visit: Payer: Medicare HMO | Admitting: Cardiovascular Disease

## 2022-05-09 ENCOUNTER — Telehealth: Payer: Self-pay | Admitting: Cardiology

## 2022-05-09 ENCOUNTER — Encounter: Payer: Self-pay | Admitting: Cardiovascular Disease

## 2022-05-09 NOTE — Telephone Encounter (Signed)
error 

## 2022-05-09 NOTE — Telephone Encounter (Signed)
*  STAT* If patient is at the pharmacy, call can be transferred to refill team.   1. Which medications need to be refilled? (please list name of each medication and dose if known)  empagliflozin (JARDIANCE) 10 MG TABS tablet  carvedilol (COREG) 3.125 MG tablet   2. Which pharmacy/location (including street and city if local pharmacy) is medication to be sent to? [place], [place]   3. Do they need a 30 day or 90 day supply? 90 day

## 2022-05-10 MED ORDER — CARVEDILOL 3.125 MG PO TABS: ORAL_TABLET | ORAL | 1 refills | Status: DC

## 2022-05-10 MED ORDER — EMPAGLIFLOZIN 10 MG PO TABS: 10.0000 mg | ORAL_TABLET | Freq: Every day | ORAL | 11 refills | Status: DC

## 2022-05-13 ENCOUNTER — Other Ambulatory Visit: Payer: Self-pay | Admitting: Cardiology

## 2022-05-16 ENCOUNTER — Encounter: Payer: Self-pay | Admitting: Cardiovascular Disease

## 2022-05-16 ENCOUNTER — Ambulatory Visit: Payer: Medicare HMO | Attending: Cardiovascular Disease | Admitting: Cardiovascular Disease

## 2022-05-16 VITALS — BP 106/76 | HR 74 | Ht 71.0 in | Wt 269.0 lb

## 2022-05-16 DIAGNOSIS — E785 Hyperlipidemia, unspecified: Secondary | ICD-10-CM | POA: Diagnosis not present

## 2022-05-16 DIAGNOSIS — I25810 Atherosclerosis of coronary artery bypass graft(s) without angina pectoris: Secondary | ICD-10-CM | POA: Diagnosis not present

## 2022-05-16 DIAGNOSIS — I739 Peripheral vascular disease, unspecified: Secondary | ICD-10-CM

## 2022-05-16 DIAGNOSIS — I480 Paroxysmal atrial fibrillation: Secondary | ICD-10-CM

## 2022-05-16 DIAGNOSIS — E1151 Type 2 diabetes mellitus with diabetic peripheral angiopathy without gangrene: Secondary | ICD-10-CM | POA: Diagnosis not present

## 2022-05-16 DIAGNOSIS — I5022 Chronic systolic (congestive) heart failure: Secondary | ICD-10-CM | POA: Diagnosis not present

## 2022-05-16 MED ORDER — CLOPIDOGREL BISULFATE 75 MG PO TABS: 75.0000 mg | ORAL_TABLET | Freq: Every day | ORAL | 3 refills | Status: DC

## 2022-05-16 NOTE — Patient Instructions (Signed)
Medication Instructions:  No changes *If you need a refill on your cardiac medications before your next appointment, please call your pharmacy*   Lab Work: Your provider would like for you to have the following labs today: CBC   If you have labs (blood work) drawn today and your tests are completely normal, you will receive your results only by: [place] (if you have MyChart) OR A paper copy in the mail If you have any lab test that is abnormal or we need to change your treatment, we will call you to review the results.   Testing/Procedures: Your physician has requested that you have an Aorta/Iliac Duplex. This will be take place at [place], [address].  No food after 11PM the night before.  Water is OK. (Don't drink liquids if you have been instructed not to for ANOTHER test) Avoid foods that produce bowel gas, for 24 hours prior to exam (see below). No breakfast, no chewing gum, no smoking or carbonated beverages. Patient may take morning medications with water. Come in for test at least 15 minutes early to register.  Your physician has requested that you have an ankle brachial index (ABI). During this test an ultrasound and blood pressure cuff are used to evaluate the arteries that supply the arms and legs with blood. Allow thirty minutes for this exam. There are no restrictions or special instructions. This will take place at [place], [address].      Follow-Up: At [facility], you and your health needs are our priority.  As part of our continuing mission to provide you with exceptional heart care, we have created designated Provider Care Teams.  These Care Teams include your primary Cardiologist (physician) and Advanced Practice Providers (APPs -  Physician Assistants and Nurse Practitioners) who all work together to provide you with the care you need, when you need it.  We recommend signing up for the patient portal called "MyChart".  Sign up  information is provided on this After Visit Summary.  MyChart is used to connect with patients for Virtual Visits (Telemedicine).  Patients are able to view lab/test results, encounter notes, upcoming appointments, etc.  Non-urgent messages can be sent to your provider as well.   To learn more about what you can do with MyChart, go to [email or web address].    Your next appointment:   12 month(s)  Provider:   Dr. Fletcher Anon

## 2022-05-16 NOTE — Progress Notes (Signed)
Cardiology Office Note   Date:  05/16/2022   ID:  Billy [name], DOB 17-Aug-1965, MRN [id number]  PCP:  [name], MD  Cardiologist:  Dr. Stanford Breed  No chief complaint on file.     History of Present Illness: Billy [name] is a 57 y.o. male who is here today for follow-up visit regarding peripheral arterial disease.  The patient had previous cardiac arrest in November 2010 status post ICD placement.  He had previous CABG.  Other medical problems include hypertension, previous tobacco use , PAD and hyperlipidemia.  He had previous left SFA stent many years ago.    He had unstable angina in October 2020.  Cardiac catheterization showed severe native 2-vessel coronary artery disease.  SVG to RCA and SVG to OM 2 were known to be occluded.  LIMA to LAD was found to be atretic with competitive flow due to minimal disease in the native LAD.  SVG to first diagonal was patent.  There was 80% ostial left circumflex stenosis which was treated with scoring balloon angioplasty.    He had worsening right calf claudication in 2021.  Angiography was performed in February 2021 which showed  severe stenosis affecting the proximal right external iliac artery with occluded mid SFA with reconstitution distally via collaterals from the profunda.  I performed successful self-expanding stent placement to the right external iliac artery.  He had issues with recurrent ICD shocks in 2022.  He underwent cardiac catheterization in February 2022 which showed significant multivessel CAD with chronic occlusion of first diagonal, mild LAD disease, patent ostial left circumflex, chronic occlusion of the mid AV groove left circumflex with collaterals and chronically occluded right coronary artery with left-to-right collaterals.  LIMA to LAD was atretic as native LAD had no obstructive disease.  He had left hip surgery at [facility] which required revision twice.   Most recent Doppler study in March of 2023 showed an  ABI of 0.85 on the right and 0.82 on the left.  Right external iliac artery stent was patent with no significant restenosis.  He has been doing reasonably well and denies chest pain or worsening dyspnea.  No significant lower extremity claudication.  He resumed taking Plavix once daily as he felt prior to taking it.  No bleeding issues.  Past Medical History:  Diagnosis Date   Aortic atherosclerosis ([place])    on CXR   Blood in stool    C. difficile colitis    a. remote hx 2010.   Cardiac arrest - ventricular fibrillation 02/11/2009   a. 02/2009 s/p St Jude ICD.   Chronic combined systolic and diastolic CHF (congestive heart failure) ([name])    Chronic kidney disease, stage 3a ([name])    Coronary artery disease    a. PCI to Cx age 53. b. CABGx4 in 2003. c. BMS to OM1 in 12/2007. d. PTCA to Cx 01/2019.   Former tobacco use    Hyperlipidemia    Hypertension    Ischemic cardiomyopathy    Marijuana abuse    Myocardial infarct ([place])    NON-ST-SEGMENT ELEVATION MI   Obesity    PAD (peripheral artery disease) ([name])    PAF (paroxysmal atrial fibrillation) ([place])    Pathologic fracture of left acetabulum 07/03/2021   PSVT (paroxysmal supraventricular tachycardia)    Ventricular tachycardia ([place])     Past Surgical History:  Procedure Laterality Date   ABDOMINAL AORTOGRAM W/LOWER EXTREMITY N/A 05/14/2019   Procedure: ABDOMINAL AORTOGRAM W/LOWER EXTREMITY;  Surgeon: Wellington Hampshire,  MD;  Location: [place] [facility];  Service: Cardiovascular;  Laterality: N/A;   CARDIAC DEFIBRILLATOR PLACEMENT     STJ single chamber ICD implanted for secondary prevention   CIRCUMCISION     CORONARY ARTERY BYPASS GRAFT  06/17/01   X 4   CORONARY BALLOON ANGIOPLASTY N/A 01/22/2019   Procedure: CORONARY BALLOON ANGIOPLASTY;  Surgeon: Leonie Man, MD;  Location: [place] [facility];  Service: Cardiovascular;  Laterality: N/A;   HIP CLOSED REDUCTION Left 07/02/2021   Procedure: CLOSED REDUCTION HIP with  placement of skeletal traction;  Surgeon: Tania Ade, MD;  Location: [place];  Service: Orthopedics;  Laterality: Left;   ICD GENERATOR CHANGEOUT N/A 06/17/2021   Procedure: ICD GENERATOR CHANGEOUT;  Surgeon: Deboraha Sprang, MD;  Location: [place] [facility];  Service: Cardiovascular;  Laterality: N/A;   LEFT HEART CATH AND CORS/GRAFTS ANGIOGRAPHY N/A 01/22/2019   Procedure: LEFT HEART CATH AND CORS/GRAFTS ANGIOGRAPHY;  Surgeon: Leonie Man, MD;  Location: [place] [facility];  Service: Cardiovascular;  Laterality: N/A;   LEFT HEART CATH AND CORS/GRAFTS ANGIOGRAPHY N/A 05/18/2020   Procedure: LEFT HEART CATH AND CORS/GRAFTS ANGIOGRAPHY;  Surgeon: Troy Sine, MD;  Location: [place] [facility];  Service: Cardiovascular;  Laterality: N/A;   OPEN REDUCTION INTERNAL FIXATION ACETABULUM FRACTURE POSTERIOR Left 07/04/2021   Procedure: OPEN REDUCTION INTERNAL FIXATION ACETABULUM FRACTURE POSTERIOR;  Surgeon: Altamese [place], MD;  Location: [place];  Service: Orthopedics;  Laterality: Left;   PERIPHERAL VASCULAR INTERVENTION Right 05/14/2019   Procedure: PERIPHERAL VASCULAR INTERVENTION;  Surgeon: Wellington Hampshire, MD;  Location: [place] [facility];  Service: Cardiovascular;  Laterality: Right;   SVT ABLATION N/A 06/21/2020   Procedure: SVT ABLATION;  Surgeon: Evans Lance, MD;  Location: [place] [facility];  Service: Cardiovascular;  Laterality: N/A;     Current Outpatient Medications  Medication Sig Dispense Refill   apixaban (ELIQUIS) 5 MG TABS tablet TAKE 1 TABLET TWICE DAILY 180 tablet 3   atorvastatin (LIPITOR) 80 MG tablet TAKE 1 TABLET EVERY DAY 90 tablet 3   carvedilol (COREG) 3.125 MG tablet TAKE 1 TABLET BY MOUTH TWICE A DAY WITH A MEAL 180 tablet 1   empagliflozin (JARDIANCE) 10 MG TABS tablet Take 1 tablet (10 mg total) by mouth daily. 30 tablet 11   ezetimibe (ZETIA) 10 MG tablet TAKE 1 TABLET (10 MG TOTAL) BY MOUTH DAILY. 90 tablet 1   fluticasone (FLONASE) 50 MCG/ACT nasal  spray Place 2 sprays into both nostrils daily as needed for allergies or rhinitis. 16 g 11   furosemide (LASIX) 40 MG tablet TAKE 1 TABLET EVERY DAY 90 tablet 3   isosorbide mononitrate (IMDUR) 30 MG 24 hr tablet Take 0.5 tablets (15 mg total) by mouth daily. 30 tablet 0   levothyroxine (SYNTHROID) 50 MCG tablet TAKE 1 TABLET BY MOUTH DAILY BEFORE BREAKFAST. [place]. 90 tablet 0   montelukast (SINGULAIR) 10 MG tablet TAKE 1 TABLET EVERY DAY AS NEEDED FOR ALLERGIES 90 tablet 1   nitroGLYCERIN (NITROSTAT) 0.4 MG SL tablet DISSOLVE 1 TABLET UNDER THE TONGUE EVERY 5 MINUTES FOR 3 DOSES AS NEEDED FOR CHEST PAIN 25 tablet 3   pantoprazole (PROTONIX) 40 MG tablet TAKE 1 TABLET BY MOUTH EVERY DAY 90 tablet 3   polyethylene glycol (MIRALAX / GLYCOLAX) 17 g packet Take 17 g by mouth daily. 14 each 0   potassium chloride SA (KLOR-CON M) 20 MEQ tablet TAKE 1 TABLET EVERY DAY 90  tablet 3   sacubitril-valsartan (ENTRESTO) 24-26 MG TAKE 1 TABLET TWICE DAILY 180 tablet 3   clopidogrel (PLAVIX) 75 MG tablet Take 1 tablet (75 mg total) by mouth daily. 90 tablet 3   No current facility-administered medications for this visit.    Allergies:   Patient has no known allergies.    Social History:  The patient  reports that he quit smoking about 19 years ago. His smoking use included cigarettes. He has a 40.00 pack-year smoking history. He has never been exposed to tobacco smoke. He has never used smokeless tobacco. He reports that he does not drink alcohol and does not use drugs.   Family History:  The patient's family history includes Heart attack in his father; Heart disease in his sister; Hypertension in his mother and another family member.    ROS:  Please see the history of present illness.   Otherwise, review of systems are positive for none.   All other systems are reviewed and negative.    PHYSICAL EXAM: VS:  BP 106/76   Pulse 74   Ht 5' 11"$  (1.803 m)   Wt 269 lb (122 kg)    BMI 37.52 kg/m  , BMI Body mass index is 37.52 kg/m. GEN: Well nourished, well developed, in no acute distress  HEENT: normal  Neck: no JVD, carotid bruits, or masses Cardiac: RRR; no murmurs, rubs, or gallops,no edema  Respiratory:  clear to auscultation bilaterally, normal work of breathing GI: soft, nontender, nondistended, + BS MS: no deformity or atrophy  Skin: warm and dry, no rash Neuro:  Strength and sensation are intact Psych: euthymic mood, full affect   EKG:  EKG is ordered today. EKG showed normal sinus rhythm with old inferior infarct.   Recent Labs: 07/03/2021: B Natriuretic Peptide 50.3 07/04/2021: Magnesium 1.8; TSH 1.936 09/27/2021: Hemoglobin 10.6; Platelets 318 12/23/2021: BUN 16; Creatinine, Ser 1.22; Potassium 4.4; Sodium 138    Lipid Panel    Component Value Date/Time   CHOL 118 12/23/2021 0818   TRIG 113 12/23/2021 0818   HDL 32 (L) 12/23/2021 0818   CHOLHDL 3.7 12/23/2021 0818   CHOLHDL 4 11/29/2020 1018   VLDL 24.4 11/29/2020 1018   LDLCALC 65 12/23/2021 0818   LDLDIRECT 95.0 11/29/2020 1018      Wt Readings from Last 3 Encounters:  05/16/22 269 lb (122 kg)  04/24/22 270 lb (122.5 kg)  12/16/21 270 lb (122.5 kg)           No data to display            ASSESSMENT AND PLAN:  1.  Peripheral arterial disease right lower extremity claudication: Status post stent placement to the right external iliac artery with .  He has known occlusion of the right SFA .  He currently has no claudication and most recent Doppler showed patent right external iliac artery stent with no restenosis.  I requested repeat ABI and aortoiliac duplex. During last visit, I discontinued clopidogrel given that he was anemic and he is on long-term anticoagulation with Eliquis.  He resumed Plavix as he felt better when he was taking it.  Will check CBC today.  If there is no evidence of anemia, it is reasonable to continue Plavix with Eliquis given his extensive peripheral  arterial disease and also coronary artery disease.  2.  Coronary artery disease involving native coronary arteries without angina: Most recent catheterization showed no new obstructive disease.   3.  Chronic systolic heart failure: He appears  to be euvolemic and currently on optimal medical therapy.    4.  Hyperlipidemia: Continue treatment with atorvastatin and Zetia with a target LDL of less than 70.  5.  Paroxysmal atrial fibrillation: He is in sinus rhythm.  He is currently on anticoagulation with Eliquis.  7.  Recurrent ICD shocks: No recent episodes.  He is no longer on amiodarone.   Disposition: Follow-up with me in 12 months.  Signed,  [name], MD  05/16/2022 10:01 AM    [place]

## 2022-05-17 LAB — CBC
Hematocrit: 51.6 % — ABNORMAL HIGH (ref 37.5–51.0)
Hemoglobin: 15.2 g/dL (ref 13.0–17.7)
MCH: 19.7 pg — ABNORMAL LOW (ref 26.6–33.0)
MCHC: 29.5 g/dL — ABNORMAL LOW (ref 31.5–35.7)
MCV: 67 fL — ABNORMAL LOW (ref 79–97)
Platelets: 359 10*3/uL (ref 150–450)
RBC: 7.71 x10E6/uL (ref 4.14–5.80)
RDW: 23.3 % — ABNORMAL HIGH (ref 11.6–15.4)
WBC: 11.2 10*3/uL — ABNORMAL HIGH (ref 3.4–10.8)

## 2022-05-18 ENCOUNTER — Other Ambulatory Visit: Payer: Self-pay

## 2022-05-18 ENCOUNTER — Telehealth: Payer: Self-pay | Admitting: Cardiology

## 2022-05-18 DIAGNOSIS — N1831 Chronic kidney disease, stage 3a: Secondary | ICD-10-CM

## 2022-05-18 DIAGNOSIS — K589 Irritable bowel syndrome without diarrhea: Secondary | ICD-10-CM

## 2022-05-18 DIAGNOSIS — I25810 Atherosclerosis of coronary artery bypass graft(s) without angina pectoris: Secondary | ICD-10-CM

## 2022-05-18 NOTE — Telephone Encounter (Signed)
Patient and spouse informed of CBC redraw. Pt will come to [facility] tomorrow AM. Order for CBC placed.

## 2022-05-18 NOTE — Telephone Encounter (Signed)
Wife was returning call. Please advise ?

## 2022-05-19 DIAGNOSIS — K589 Irritable bowel syndrome without diarrhea: Secondary | ICD-10-CM | POA: Diagnosis not present

## 2022-05-19 DIAGNOSIS — N1831 Chronic kidney disease, stage 3a: Secondary | ICD-10-CM | POA: Diagnosis not present

## 2022-05-19 DIAGNOSIS — I25810 Atherosclerosis of coronary artery bypass graft(s) without angina pectoris: Secondary | ICD-10-CM | POA: Diagnosis not present

## 2022-05-20 LAB — CBC
Hematocrit: 54.9 % — ABNORMAL HIGH (ref 37.5–51.0)
Hemoglobin: 16.3 g/dL (ref 13.0–17.7)
MCH: 20.2 pg — ABNORMAL LOW (ref 26.6–33.0)
MCHC: 29.7 g/dL — ABNORMAL LOW (ref 31.5–35.7)
MCV: 68 fL — ABNORMAL LOW (ref 79–97)
Platelets: 346 10*3/uL (ref 150–450)
RBC: 8.05 x10E6/uL (ref 4.14–5.80)
RDW: 23.1 % — ABNORMAL HIGH (ref 11.6–15.4)
WBC: 10.3 10*3/uL (ref 3.4–10.8)

## 2022-05-22 ENCOUNTER — Other Ambulatory Visit: Payer: Self-pay

## 2022-05-22 MED ORDER — ISOSORBIDE MONONITRATE ER 30 MG PO TB24: 15.0000 mg | ORAL_TABLET | Freq: Every day | ORAL | 3 refills | Status: DC

## 2022-05-24 ENCOUNTER — Telehealth: Payer: Self-pay | Admitting: Cardiovascular Disease

## 2022-05-24 ENCOUNTER — Other Ambulatory Visit: Payer: Self-pay

## 2022-05-24 DIAGNOSIS — R718 Other abnormality of red blood cells: Secondary | ICD-10-CM

## 2022-05-24 NOTE — Telephone Encounter (Signed)
Follow Up:     Wife is returning call from yesterday, concerning patient's lab results.

## 2022-05-24 NOTE — Telephone Encounter (Signed)
Called patient wife (okay per DPR) gave results.  Hematology referral ordered.   Patient wife verbalized understanding.   Thanks!

## 2022-05-25 ENCOUNTER — Telehealth: Payer: Self-pay | Admitting: Oncology

## 2022-05-25 NOTE — Telephone Encounter (Signed)
Scheduled appt per 2/21 referral. Pt is aware of appt date and time. Pt is aware to arrive 15 mins prior to appt time and to bring and updated insurance card. Pt is aware of appt location.   ?

## 2022-05-29 ENCOUNTER — Ambulatory Visit (HOSPITAL_BASED_OUTPATIENT_CLINIC_OR_DEPARTMENT_OTHER)
Admission: RE | Admit: 2022-05-29 | Discharge: 2022-05-29 | Disposition: A | Discharge status: Home / Self Care | Payer: Medicare HMO | Source: Ambulatory Visit | Attending: Cardiology | Admitting: Cardiology

## 2022-05-29 ENCOUNTER — Ambulatory Visit (HOSPITAL_COMMUNITY)
Admission: RE | Admit: 2022-05-29 | Discharge: 2022-05-29 | Disposition: A | Discharge status: Home / Self Care | Payer: Medicare HMO | Source: Ambulatory Visit | Attending: Cardiology | Admitting: Cardiology

## 2022-05-29 DIAGNOSIS — Z95820 Peripheral vascular angioplasty status with implants and grafts: Secondary | ICD-10-CM

## 2022-05-29 DIAGNOSIS — I739 Peripheral vascular disease, unspecified: Secondary | ICD-10-CM | POA: Diagnosis not present

## 2022-05-29 LAB — VAS US ABI WITH/WO TBI
Left ABI: 0.64
Right ABI: 0.79

## 2022-06-02 ENCOUNTER — Other Ambulatory Visit: Payer: Self-pay | Admitting: *Deleted

## 2022-06-02 DIAGNOSIS — I739 Peripheral vascular disease, unspecified: Secondary | ICD-10-CM

## 2022-06-06 ENCOUNTER — Telehealth: Payer: Self-pay | Admitting: Nurse Practitioner

## 2022-06-06 NOTE — Telephone Encounter (Signed)
R/s pt's appt. Pt's wife is aware.

## 2022-06-07 NOTE — Progress Notes (Signed)
Remote ICD transmission.   

## 2022-06-07 NOTE — Progress Notes (Signed)
HPI:  FU CAD. Patient suffered a cardiac arrest in November of 2010. Echocardiogram initially revealed an EF of 10-15% however followup echo showed an EF of 40-45. Given out of the hospital VF arrest a St. Jude single lead ICD was placed. Also had episode of atrial fibrillation November 2020 and dyspnea with Brilinta. Cardiac catheterization February 2022 showed old occlusion of the first diagonal, old occlusion of the mid circumflex but with good collateralization to the distal vessel, mid total occlusion of the right coronary artery with collateralization to the distal PDA and PLA, atretic LIMA to the LAD, patent SVG to the diagonal and occlusion of the saphenous vein graft to the OM 2 and of the SVG to distal right coronary artery.  Medical therapy recommended.   Last echocardiogram March 2023 showed ejection fraction 40 to 45%, mild left ventricular hypertrophy, grade 1 diastolic dysfunction.  Patient also with peripheral vascular disease followed by Dr. Fletcher Anon; he has had previous stent to the right external iliac artery.  Also with previous ablation of atrial tachycardia.  ABIs February 2024 showed no significant stenosis and patent right external iliac artery stent.  ABIs February 2024 moderate on the right and left.  Since I last saw him the patient has dyspnea with more extreme activities but not with routine activities. It is relieved with rest. It is not associated with chest pain. There is no orthopnea, PND or pedal edema. There is no syncope or palpitations. There is no exertional chest pain.   Current Outpatient Medications  Medication Sig Dispense Refill   alum & mag hydroxide-simeth (MAALOX MAX) [id number] MG/5ML suspension Take 10 mLs by mouth every 6 (six) hours as needed for indigestion. 355 mL 0   apixaban (ELIQUIS) 5 MG TABS tablet TAKE 1 TABLET TWICE DAILY 180 tablet 3   atorvastatin (LIPITOR) 80 MG tablet TAKE 1 TABLET EVERY DAY 90 tablet 3   carvedilol (COREG) 3.125 MG tablet  TAKE 1 TABLET BY MOUTH TWICE A DAY WITH A MEAL 180 tablet 1   clopidogrel (PLAVIX) 75 MG tablet Take 1 tablet (75 mg total) by mouth daily. 90 tablet 3   empagliflozin (JARDIANCE) 10 MG TABS tablet Take 1 tablet (10 mg total) by mouth daily. 30 tablet 11   ezetimibe (ZETIA) 10 MG tablet TAKE 1 TABLET (10 MG TOTAL) BY MOUTH DAILY. 90 tablet 1   fluticasone (FLONASE) 50 MCG/ACT nasal spray Place 2 sprays into both nostrils daily as needed for allergies or rhinitis. 16 g 11   furosemide (LASIX) 40 MG tablet TAKE 1 TABLET EVERY DAY 90 tablet 3   isosorbide mononitrate (IMDUR) 30 MG 24 hr tablet Take 0.5 tablets (15 mg total) by mouth daily. 90 tablet 3   levothyroxine (SYNTHROID) 50 MCG tablet TAKE 1 TABLET BY MOUTH DAILY BEFORE BREAKFAST. [place]. 90 tablet 0   loperamide (IMODIUM) 2 MG capsule Take 1 capsule (2 mg total) by mouth 4 (four) times daily as needed for diarrhea or loose stools. 12 capsule 0   montelukast (SINGULAIR) 10 MG tablet TAKE 1 TABLET EVERY DAY AS NEEDED FOR ALLERGIES 90 tablet 1   nitroGLYCERIN (NITROSTAT) 0.4 MG SL tablet DISSOLVE 1 TABLET UNDER THE TONGUE EVERY 5 MINUTES FOR 3 DOSES AS NEEDED FOR CHEST PAIN 25 tablet 3   pantoprazole (PROTONIX) 40 MG tablet TAKE 1 TABLET BY MOUTH EVERY DAY 90 tablet 3   polyethylene glycol (MIRALAX / GLYCOLAX) 17 g packet Take 17 g by mouth daily.  14 each 0   potassium chloride SA (KLOR-CON M) 20 MEQ tablet TAKE 1 TABLET EVERY DAY 90 tablet 3   sacubitril-valsartan (ENTRESTO) 24-26 MG TAKE 1 TABLET TWICE DAILY 180 tablet 3   No current facility-administered medications for this visit.     Past Medical History:  Diagnosis Date   Aortic atherosclerosis ([place])    on CXR   Blood in stool    C. difficile colitis    a. remote hx 2010.   Cardiac arrest - ventricular fibrillation 02/11/2009   a. 02/2009 s/p St Jude ICD.   Chronic combined systolic and diastolic CHF (congestive heart failure) ([name])    Chronic kidney  disease, stage 3a ([name])    Coronary artery disease    a. PCI to Cx age 67. b. CABGx4 in 2003. c. BMS to OM1 in 12/2007. d. PTCA to Cx 01/2019.   Former tobacco use    Hyperlipidemia    Hypertension    Ischemic cardiomyopathy    Marijuana abuse    Myocardial infarct ([place])    NON-ST-SEGMENT ELEVATION MI   Obesity    PAD (peripheral artery disease) ([name])    PAF (paroxysmal atrial fibrillation) ([place])    Pathologic fracture of left acetabulum 07/03/2021   PSVT (paroxysmal supraventricular tachycardia)    Ventricular tachycardia ([place])     Past Surgical History:  Procedure Laterality Date   ABDOMINAL AORTOGRAM W/LOWER EXTREMITY N/A 05/14/2019   Procedure: ABDOMINAL AORTOGRAM W/LOWER EXTREMITY;  Surgeon: Wellington Hampshire, MD;  Location: [facility];  Service: Cardiovascular;  Laterality: N/A;   CARDIAC DEFIBRILLATOR PLACEMENT     STJ single chamber ICD implanted for secondary prevention   CIRCUMCISION     CORONARY ARTERY BYPASS GRAFT  06/17/01   X 4   CORONARY BALLOON ANGIOPLASTY N/A 01/22/2019   Procedure: CORONARY BALLOON ANGIOPLASTY;  Surgeon: Leonie Man, MD;  Location: [facility];  Service: Cardiovascular;  Laterality: N/A;   HIP CLOSED REDUCTION Left 07/02/2021   Procedure: CLOSED REDUCTION HIP with placement of skeletal traction;  Surgeon: Tania Ade, MD;  Location: [facility];  Service: Orthopedics;  Laterality: Left;   ICD GENERATOR CHANGEOUT N/A 06/17/2021   Procedure: ICD GENERATOR CHANGEOUT;  Surgeon: Deboraha Sprang, MD;  Location: [facility];  Service: Cardiovascular;  Laterality: N/A;   LEFT HEART CATH AND CORS/GRAFTS ANGIOGRAPHY N/A 01/22/2019   Procedure: LEFT HEART CATH AND CORS/GRAFTS ANGIOGRAPHY;  Surgeon: Leonie Man, MD;  Location: [facility];  Service: Cardiovascular;  Laterality: N/A;   LEFT HEART CATH AND CORS/GRAFTS ANGIOGRAPHY N/A 05/18/2020   Procedure: LEFT HEART CATH AND CORS/GRAFTS ANGIOGRAPHY;  Surgeon: Troy Sine,  MD;  Location: [facility];  Service: Cardiovascular;  Laterality: N/A;   OPEN REDUCTION INTERNAL FIXATION ACETABULUM FRACTURE POSTERIOR Left 07/04/2021   Procedure: OPEN REDUCTION INTERNAL FIXATION ACETABULUM FRACTURE POSTERIOR;  Surgeon: Altamese Dawson, MD;  Location: [facility];  Service: Orthopedics;  Laterality: Left;   PERIPHERAL VASCULAR INTERVENTION Right 05/14/2019   Procedure: PERIPHERAL VASCULAR INTERVENTION;  Surgeon: Wellington Hampshire, MD;  Location: [facility];  Service: Cardiovascular;  Laterality: Right;   SVT ABLATION N/A 06/21/2020   Procedure: SVT ABLATION;  Surgeon: Evans Lance, MD;  Location: [facility];  Service: Cardiovascular;  Laterality: N/A;    Social History   Socioeconomic History   Marital status: Married    Spouse name: Not on file   Number of children: 3   Years  of education: 11   Highest education level: Not on file  Occupational History   Occupation: Disability  Tobacco Use   Smoking status: Former    Packs/day: 2.00    Years: 20.00    Additional pack years: 0.00    Total pack years: 40.00    Types: Cigarettes    Quit date: 04/04/2003    Years since quitting: 19.2    Passive exposure: Never   Smokeless tobacco: Never  Vaping Use   Vaping Use: Never used  Substance and Sexual Activity   Alcohol use: No   Drug use: No   Sexual activity: Yes    Partners: Female  Other Topics Concern   Not on file  Social History Narrative   MARRIED   FORMER TOBACCO USE, SMOKED FOR 20 YRS 2 PPD; QUIT IN 2005   NO ETOH   NO ILLICIT DRUG USE   NO REGULAR EXERCISE         ICD-ST. JUDE ; CERTIFIED LETTER SENT AND RETURNED BAD ADDRESS, PLS UPDATE DJW.      Fun: Fishing    Social Determinants of Health   Financial Resource Strain: Low Risk  (04/24/2022)   Overall Financial Resource Strain (CARDIA)    Difficulty of Paying Living Expenses: Not hard at all  Food Insecurity: No Food Insecurity (04/24/2022)   Hunger Vital Sign    Worried About  Running Out of Food in the Last Year: Never true    Ran Out of Food in the Last Year: Never true  Transportation Needs: No Transportation Needs (04/24/2022)   PRAPARE - Hydrologist (Medical): No    Lack of Transportation (Non-Medical): No  Physical Activity: Sufficiently Active (04/24/2022)   Exercise Vital Sign    Days of Exercise per Week: 6 days    Minutes of Exercise per Session: 30 min  Stress: No Stress Concern Present (04/24/2022)   [place]    Feeling of Stress : Not at all  Social Connections: Moderately Integrated (04/24/2022)   Social Connection and Isolation Panel [NHANES]    Frequency of Communication with Friends and Family: More than three times a week    Frequency of Social Gatherings with Friends and Family: More than three times a week    Attends Religious Services: More than 4 times per year    Active Member of [employer] or Organizations: No    Attends Archivist Meetings: Never    Marital Status: Married  Human resources officer Violence: Not At Risk (04/24/2022)   Humiliation, Afraid, Rape, and Kick questionnaire    Fear of Current or Ex-Partner: No    Emotionally Abused: No    Physically Abused: No    Sexually Abused: No    Family History  Problem Relation Age of Onset   Hypertension Mother    Heart attack Father        DIED AT 46 FROM MI   Heart disease Sister        CAD AND PREVIOUS CABG   Hypertension Other        IN MOST OF HIS SIBLINGS    ROS: no fevers or chills, productive cough, hemoptysis, dysphasia, odynophagia, melena, hematochezia, dysuria, hematuria, rash, seizure activity, orthopnea, PND, pedal edema, claudication. Remaining systems are negative.  Physical Exam: Well-developed well-nourished in no acute distress.  Skin is warm and dry.  HEENT is normal.  Neck is supple.  Chest is clear to auscultation with  normal expansion.  Cardiovascular exam  is regular rate and rhythm.  Abdominal exam nontender or distended. No masses palpated. Extremities show no edema. neuro grossly intact   A/P  1 coronary artery disease-patient denies chest pain.  Plan to continue medical therapy with statin.  He is not on aspirin given need for apixaban.  2 ischemic cardiomyopathy-continue Entresto and carvedilol.  Continue Jardiance.  3 paroxysmal atrial fibrillation-patient remains in sinus rhythm.  Continue apixaban and beta-blocker.  4 hyperlipidemia-continue statin.  5 hypertension-blood pressure controlled.  Continue present medications and follow-up.  6 ICD-managed by electrophysiology.  7 history of ventricular tachycardia-continue amiodarone.  8 history of SVT-status post ablation.  9 peripheral vascular disease-followed by Dr. Velva Harman.  Continue statin.  Kirk Ruths, MD

## 2022-06-07 NOTE — Addendum Note (Signed)
Addended by: Douglass Rivers D on: 06/07/2022 10:21 AM   Modules accepted: Level of Service

## 2022-06-10 ENCOUNTER — Emergency Department (HOSPITAL_COMMUNITY): Payer: Medicare HMO

## 2022-06-10 ENCOUNTER — Emergency Department (HOSPITAL_COMMUNITY)
Admission: EM | Admit: 2022-06-10 | Discharge: 2022-06-10 | Disposition: A | Discharge status: Home / Self Care | Payer: Medicare HMO | Attending: Emergency Medicine | Admitting: Emergency Medicine

## 2022-06-10 ENCOUNTER — Ambulatory Visit
Admission: EM | Admit: 2022-06-10 | Discharge: 2022-06-10 | Disposition: A | Discharge status: Home / Self Care | Payer: Medicare HMO

## 2022-06-10 ENCOUNTER — Other Ambulatory Visit: Payer: Self-pay

## 2022-06-10 DIAGNOSIS — R110 Nausea: Secondary | ICD-10-CM | POA: Insufficient documentation

## 2022-06-10 DIAGNOSIS — I509 Heart failure, unspecified: Secondary | ICD-10-CM | POA: Diagnosis not present

## 2022-06-10 DIAGNOSIS — Z7901 Long term (current) use of anticoagulants: Secondary | ICD-10-CM | POA: Diagnosis not present

## 2022-06-10 DIAGNOSIS — I480 Paroxysmal atrial fibrillation: Secondary | ICD-10-CM | POA: Insufficient documentation

## 2022-06-10 DIAGNOSIS — Z95810 Presence of automatic (implantable) cardiac defibrillator: Secondary | ICD-10-CM | POA: Insufficient documentation

## 2022-06-10 DIAGNOSIS — R109 Unspecified abdominal pain: Secondary | ICD-10-CM | POA: Insufficient documentation

## 2022-06-10 DIAGNOSIS — R197 Diarrhea, unspecified: Secondary | ICD-10-CM | POA: Diagnosis not present

## 2022-06-10 DIAGNOSIS — R1010 Upper abdominal pain, unspecified: Secondary | ICD-10-CM

## 2022-06-10 DIAGNOSIS — K5730 Diverticulosis of large intestine without perforation or abscess without bleeding: Secondary | ICD-10-CM | POA: Diagnosis not present

## 2022-06-10 DIAGNOSIS — R1084 Generalized abdominal pain: Secondary | ICD-10-CM | POA: Diagnosis not present

## 2022-06-10 DIAGNOSIS — N3289 Other specified disorders of bladder: Secondary | ICD-10-CM | POA: Diagnosis not present

## 2022-06-10 DIAGNOSIS — R112 Nausea with vomiting, unspecified: Secondary | ICD-10-CM | POA: Diagnosis not present

## 2022-06-10 DIAGNOSIS — I10 Essential (primary) hypertension: Secondary | ICD-10-CM | POA: Diagnosis not present

## 2022-06-10 LAB — CBC WITH DIFFERENTIAL/PLATELET
Abs Immature Granulocytes: 0.05 10*3/uL (ref 0.00–0.07)
Basophils Absolute: 0 10*3/uL (ref 0.0–0.1)
Basophils Relative: 0 %
Eosinophils Absolute: 0.2 10*3/uL (ref 0.0–0.5)
Eosinophils Relative: 2 %
HCT: 46.9 % (ref 39.0–52.0)
Hemoglobin: 14.6 g/dL (ref 13.0–17.0)
Immature Granulocytes: 1 %
Lymphocytes Relative: 25 %
Lymphs Abs: 2.7 10*3/uL (ref 0.7–4.0)
MCH: 20.4 pg — ABNORMAL LOW (ref 26.0–34.0)
MCHC: 31.1 g/dL (ref 30.0–36.0)
MCV: 65.7 fL — ABNORMAL LOW (ref 80.0–100.0)
Monocytes Absolute: 1.3 10*3/uL — ABNORMAL HIGH (ref 0.1–1.0)
Monocytes Relative: 12 %
Neutro Abs: 6.2 10*3/uL (ref 1.7–7.7)
Neutrophils Relative %: 60 %
Platelets: 338 10*3/uL (ref 150–400)
RBC: 7.14 MIL/uL — ABNORMAL HIGH (ref 4.22–5.81)
RDW: 24.3 % — ABNORMAL HIGH (ref 11.5–15.5)
WBC: 10.4 10*3/uL (ref 4.0–10.5)
nRBC: 0 % (ref 0.0–0.2)

## 2022-06-10 LAB — COMPREHENSIVE METABOLIC PANEL
ALT: 20 U/L (ref 0–44)
AST: 22 U/L (ref 15–41)
Albumin: 3.5 g/dL (ref 3.5–5.0)
Alkaline Phosphatase: 86 U/L (ref 38–126)
Anion gap: 7 (ref 5–15)
BUN: 17 mg/dL (ref 6–20)
CO2: 20 mmol/L — ABNORMAL LOW (ref 22–32)
Calcium: 8.2 mg/dL — ABNORMAL LOW (ref 8.9–10.3)
Chloride: 111 mmol/L (ref 98–111)
Creatinine, Ser: 1.03 mg/dL (ref 0.61–1.24)
GFR, Estimated: >60 mL/min (ref >60)
Glucose, Bld: 84 mg/dL (ref 70–99)
Potassium: 3.1 mmol/L — ABNORMAL LOW (ref 3.5–5.1)
Sodium: 138 mmol/L (ref 135–145)
Total Bilirubin: 0.8 mg/dL (ref 0.3–1.2)
Total Protein: 6.8 g/dL (ref 6.5–8.1)

## 2022-06-10 LAB — C DIFFICILE QUICK SCREEN W PCR REFLEX
C Diff antigen: NEGATIVE
C Diff interpretation: NOT DETECTED
C Diff toxin: NEGATIVE

## 2022-06-10 LAB — LIPASE, BLOOD: Lipase: 40 U/L (ref 11–51)

## 2022-06-10 LAB — MAGNESIUM: Magnesium: 1.8 mg/dL (ref 1.7–2.4)

## 2022-06-10 MED ORDER — MAALOX MAX 400-400-40 MG/5ML PO SUSP: 10.0000 mL | Freq: Four times a day (QID) | ORAL | 0 refills | Status: AC | PRN

## 2022-06-10 MED ORDER — LOPERAMIDE HCL 2 MG PO CAPS: 2.0000 mg | ORAL_CAPSULE | Freq: Four times a day (QID) | ORAL | 0 refills | Status: DC | PRN

## 2022-06-10 MED ORDER — SODIUM CHLORIDE 0.9 % IV BOLUS: 500.0000 mL | Freq: Once | INTRAVENOUS | Status: DC

## 2022-06-10 MED ORDER — FAMOTIDINE IN NACL 20-0.9 MG/50ML-% IV SOLN
20.0000 mg | Freq: Once | INTRAVENOUS | Status: AC
  Administered 2022-06-10: 20 mg via INTRAVENOUS
  Filled 2022-06-10: qty 50

## 2022-06-10 MED ORDER — IOHEXOL 300 MG/ML  SOLN
100.0000 mL | Freq: Once | INTRAMUSCULAR | Status: AC | PRN
  Administered 2022-06-10: 100 mL via INTRAVENOUS

## 2022-06-10 MED ORDER — ALUM & MAG HYDROXIDE-SIMETH 200-200-20 MG/5ML PO SUSP
15.0000 mL | Freq: Once | ORAL | Status: AC
  Administered 2022-06-10: 15 mL via ORAL
  Filled 2022-06-10: qty 30

## 2022-06-10 MED ORDER — POTASSIUM CHLORIDE CRYS ER 20 MEQ PO TBCR
40.0000 meq | EXTENDED_RELEASE_TABLET | Freq: Once | ORAL | Status: AC
  Administered 2022-06-10: 40 meq via ORAL
  Filled 2022-06-10: qty 2

## 2022-06-10 MED ORDER — SODIUM CHLORIDE 0.9 % IV BOLUS
500.0000 mL | Freq: Once | INTRAVENOUS | Status: AC
  Administered 2022-06-10: 500 mL via INTRAVENOUS

## 2022-06-10 NOTE — ED Provider Notes (Signed)
[facility]    CSN: [id number] Arrival date & time: 06/10/22  0807      History   Chief Complaint Chief Complaint  Patient presents with   Abdominal Pain    HPI Billy [name] is a 57 y.o. male.   Patient presents with abdominal pain and diarrhea that has been present for about 1.5 weeks per patient report.  He states that his children had a stomach virus and then he subsequently got similar symptoms.  He reports that he did have some vomiting at start of symptoms which is now resolved.  He is left with some persistent nausea.  Reports abdominal pain is in the upper abdomen and is described as "indigestion" and patient reports it is constant.  Patient reports that pain is rated 6/10 on pain scale.  He took some leftover doxycycline for about 4 doses with minimal improvement in symptoms.  Diarrhea is constant per patient.  Denies blood in stool or emesis.  Reports fever at start of symptoms but no fevers have been persistent.  Reports that he has been able to tolerate food and fluids recently.   Abdominal Pain   Past Medical History:  Diagnosis Date   Aortic atherosclerosis ([place])    on CXR   Blood in stool    C. difficile colitis    a. remote hx 2010.   Cardiac arrest - ventricular fibrillation 02/11/2009   a. 02/2009 s/p St Jude ICD.   Chronic combined systolic and diastolic CHF (congestive heart failure) ([name])    Chronic kidney disease, stage 3a ([name])    Coronary artery disease    a. PCI to Cx age 59. b. CABGx4 in 2003. c. BMS to OM1 in 12/2007. d. PTCA to Cx 01/2019.   Former tobacco use    Hyperlipidemia    Hypertension    Ischemic cardiomyopathy    Marijuana abuse    Myocardial infarct ([place])    NON-ST-SEGMENT ELEVATION MI   Obesity    PAD (peripheral artery disease) ([name])    PAF (paroxysmal atrial fibrillation) ([place])    Pathologic fracture of left acetabulum 07/03/2021   PSVT (paroxysmal supraventricular tachycardia)    Ventricular tachycardia [facility])      Patient Active Problem List   Diagnosis Date Noted   H/O dental abscess 08/17/2021   History of tobacco use 08/17/2021   Osteoporotic hip fracture with delayed healing 08/17/2021   Low testosterone in male 08/17/2021   Diabetes mellitus ([place]) 08/16/2021   Atrial tachycardia 08/16/2021   Vitamin D deficiency 07/03/2021   Pathologic fracture of left acetabulum 07/03/2021   Chronic HFrEF (heart failure with reduced ejection fraction) ([place]) 07/02/2021   Closed left acetabular fracture ([place]) 07/01/2021   Dislocated hip, left, initial encounter ([place]) 07/01/2021   Stage 3a chronic kidney disease (CKD) ([place]) - baseline SCr 1.3-1.6 07/01/2021   Tremor, essential 03/01/2021   Hypothyroidism 03/01/2021   Colon cancer screening 11/29/2020   Elevated TSH 11/29/2020   Elevated glucose 11/29/2020   Snores 11/29/2020   Morbid obesity ([place]) 11/29/2020   Ventricular tachycardia ([place]) 05/23/2020   SVT (supraventricular tachycardia)    Status post angioplasty 02/15/2019   Unstable angina ([place]) 01/22/2019   Coronary artery disease involving native coronary artery of native heart with angina pectoris ([place]) 01/22/2019   Irritable bowel syndrome 08/23/2017   Seasonal allergies 04/19/2017   S/P quadruple vessel bypass 04/19/2017   Prediabetes 04/19/2017   GERD (gastroesophageal reflux disease) 01/31/2016   Implantable cardioverter-defibrillator (ICD) in situ 06/12/2011  VENTRICULAR FIBRILLATION 03/02/2009   Ischemic cardiomyopathy 08/25/2008   Elevated cholesterol 01/15/2008   Essential hypertension 01/15/2008   Hx of CABG 01/15/2008   PERIPHERAL VASCULAR DISEASE 01/15/2008    Past Surgical History:  Procedure Laterality Date   ABDOMINAL AORTOGRAM W/LOWER EXTREMITY N/A 05/14/2019   Procedure: ABDOMINAL AORTOGRAM W/LOWER EXTREMITY;  Surgeon: Wellington Hampshire, MD;  Location: [facility];  Service: Cardiovascular;  Laterality: N/A;   CARDIAC DEFIBRILLATOR PLACEMENT     STJ single  chamber ICD implanted for secondary prevention   CIRCUMCISION     CORONARY ARTERY BYPASS GRAFT  06/17/01   X 4   CORONARY BALLOON ANGIOPLASTY N/A 01/22/2019   Procedure: CORONARY BALLOON ANGIOPLASTY;  Surgeon: Leonie Man, MD;  Location: [facility];  Service: Cardiovascular;  Laterality: N/A;   HIP CLOSED REDUCTION Left 07/02/2021   Procedure: CLOSED REDUCTION HIP with placement of skeletal traction;  Surgeon: Tania Ade, MD;  Location: [facility];  Service: Orthopedics;  Laterality: Left;   ICD GENERATOR CHANGEOUT N/A 06/17/2021   Procedure: ICD GENERATOR CHANGEOUT;  Surgeon: Deboraha Sprang, MD;  Location: [facility];  Service: Cardiovascular;  Laterality: N/A;   LEFT HEART CATH AND CORS/GRAFTS ANGIOGRAPHY N/A 01/22/2019   Procedure: LEFT HEART CATH AND CORS/GRAFTS ANGIOGRAPHY;  Surgeon: Leonie Man, MD;  Location: [facility];  Service: Cardiovascular;  Laterality: N/A;   LEFT HEART CATH AND CORS/GRAFTS ANGIOGRAPHY N/A 05/18/2020   Procedure: LEFT HEART CATH AND CORS/GRAFTS ANGIOGRAPHY;  Surgeon: Troy Sine, MD;  Location: [facility];  Service: Cardiovascular;  Laterality: N/A;   OPEN REDUCTION INTERNAL FIXATION ACETABULUM FRACTURE POSTERIOR Left 07/04/2021   Procedure: OPEN REDUCTION INTERNAL FIXATION ACETABULUM FRACTURE POSTERIOR;  Surgeon: Altamese Bradford, MD;  Location: [facility];  Service: Orthopedics;  Laterality: Left;   PERIPHERAL VASCULAR INTERVENTION Right 05/14/2019   Procedure: PERIPHERAL VASCULAR INTERVENTION;  Surgeon: Wellington Hampshire, MD;  Location: [facility];  Service: Cardiovascular;  Laterality: Right;   SVT ABLATION N/A 06/21/2020   Procedure: SVT ABLATION;  Surgeon: Evans Lance, MD;  Location: [facility];  Service: Cardiovascular;  Laterality: N/A;       Home Medications    Prior to Admission medications   Medication Sig Start Date End Date Taking? Authorizing Provider  apixaban (ELIQUIS) 5 MG TABS tablet TAKE  1 TABLET TWICE DAILY 02/15/22   Lelon Perla, MD  atorvastatin (LIPITOR) 80 MG tablet TAKE 1 TABLET EVERY DAY 07/06/21   Deboraha Sprang, MD  carvedilol (COREG) 3.125 MG tablet TAKE 1 TABLET BY MOUTH TWICE A DAY WITH A MEAL 05/10/22   Lelon Perla, MD  clopidogrel (PLAVIX) 75 MG tablet Take 1 tablet (75 mg total) by mouth daily. 05/16/22   Wellington Hampshire, MD  empagliflozin (JARDIANCE) 10 MG TABS tablet Take 1 tablet (10 mg total) by mouth daily. 05/10/22   Lelon Perla, MD  ezetimibe (ZETIA) 10 MG tablet TAKE 1 TABLET (10 MG TOTAL) BY MOUTH DAILY. 11/18/21   Wellington Hampshire, MD  fluticasone (FLONASE) 50 MCG/ACT nasal spray Place 2 sprays into both nostrils daily as needed for allergies or rhinitis. 06/11/19   Nicolette Bang, MD  furosemide (LASIX) 40 MG tablet TAKE 1 TABLET EVERY DAY 06/29/21   Lelon Perla, MD  isosorbide mononitrate (IMDUR) 30 MG 24 hr tablet Take 0.5 tablets (15 mg total) by mouth daily. 05/22/22   Shirley Friar, PA-C  levothyroxine (  SYNTHROID) 50 MCG tablet TAKE 1 TABLET BY MOUTH DAILY BEFORE BREAKFAST. Door. 04/05/22   [name], MD  montelukast (SINGULAIR) 10 MG tablet TAKE 1 TABLET EVERY DAY AS NEEDED FOR ALLERGIES 09/16/20   Camillia Herter, NP  nitroGLYCERIN (NITROSTAT) 0.4 MG SL tablet DISSOLVE 1 TABLET UNDER THE TONGUE EVERY 5 MINUTES FOR 3 DOSES AS NEEDED FOR CHEST PAIN 04/04/22   Wellington Hampshire, MD  pantoprazole (PROTONIX) 40 MG tablet TAKE 1 TABLET BY MOUTH EVERY DAY 02/28/22   Lelon Perla, MD  polyethylene glycol (MIRALAX / GLYCOLAX) 17 g packet Take 17 g by mouth daily. 07/09/21   Shelly Coss, MD  potassium chloride SA (KLOR-CON M) 20 MEQ tablet TAKE 1 TABLET EVERY DAY 05/23/21   Lelon Perla, MD  sacubitril-valsartan (ENTRESTO) 24-26 MG TAKE 1 TABLET TWICE DAILY 07/06/21   Deboraha Sprang, MD    Family History Family History  Problem Relation Age of Onset   Hypertension Mother     Heart attack Father        DIED AT 54 FROM MI   Heart disease Sister        CAD AND PREVIOUS CABG   Hypertension Other        IN MOST OF HIS SIBLINGS    Social History Social History   Tobacco Use   Smoking status: Former    Packs/day: 2.00    Years: 20.00    Total pack years: 40.00    Types: Cigarettes    Quit date: 04/04/2003    Years since quitting: 19.1    Passive exposure: Never   Smokeless tobacco: Never  Vaping Use   Vaping Use: Never used  Substance Use Topics   Alcohol use: No   Drug use: No     Allergies   Patient has no known allergies.   Review of Systems Review of Systems Per HPI  Physical Exam Triage Vital Signs ED Triage Vitals  Enc Vitals Group     BP 06/10/22 0822 (S) (!) 78/56     Pulse Rate 06/10/22 0822 85     Resp 06/10/22 0822 16     Temp 06/10/22 0822 97.9 F (36.6 C)     Temp Source 06/10/22 0822 Oral     SpO2 06/10/22 0822 95 %     Weight --      Height --      Head Circumference --      Peak Flow --      Pain Score 06/10/22 0821 6     Pain Loc --      Pain Edu? --      Excl. in [place]? --    No data found.  Updated Vital Signs BP (S) 117/83 Comment: rechecked BP. 3 attempts  Pulse 85   Temp 97.9 F (36.6 C) (Oral)   Resp 16   SpO2 95%   Visual Acuity Right Eye Distance:   Left Eye Distance:   Bilateral Distance:    Right Eye Near:   Left Eye Near:    Bilateral Near:     Physical Exam Constitutional:      General: He is not in acute distress.    Appearance: Normal appearance. He is diaphoretic. He is not toxic-appearing.  HENT:     Head: Normocephalic and atraumatic.  Eyes:     Extraocular Movements: Extraocular movements intact.     Conjunctiva/sclera: Conjunctivae normal.  Cardiovascular:     Rate and  Rhythm: Normal rate and regular rhythm.     Pulses: Normal pulses.     Heart sounds: Normal heart sounds.  Pulmonary:     Effort: Pulmonary effort is normal. No respiratory distress.     Breath sounds:  Normal breath sounds.  Abdominal:     General: Bowel sounds are normal.     Palpations: Abdomen is soft.     Tenderness: There is abdominal tenderness.       Comments: Tenderness to upper abdomen to palpation.  Neurological:     General: No focal deficit present.     Mental Status: He is alert and oriented to person, place, and time. Mental status is at baseline.  Psychiatric:        Mood and Affect: Mood normal.        Behavior: Behavior normal.        Thought Content: Thought content normal.        Judgment: Judgment normal.      UC Treatments / Results  Labs (all labs ordered are listed, but only abnormal results are displayed) Labs Reviewed - No data to display  EKG   Radiology No results found.  Procedures Procedures (including critical care time)  Medications Ordered in UC Medications - No data to display  Initial Impression / Assessment and Plan / UC Course  I have reviewed the triage vital signs and the nursing notes.  Pertinent labs & imaging results that were available during my care of the patient were reviewed by me and considered in my medical decision making (see chart for details).     Called to physical exam room by nursing staff given that blood pressure was 70 systolic.  Blood pressure had to be rechecked several times which finally resulted in 117/83.  I am very concerned that patient is dehydrated and needs a higher level of care.  Patient was advised that he needs to go to the ER via EMS transport.  Patient was agreeable with plan.  patient left via EMS transport. Final Clinical Impressions(s) / UC Diagnoses   Final diagnoses:  Pain of upper abdomen  Diarrhea, unspecified type   Discharge Instructions   None    ED Prescriptions   None    PDMP not reviewed this encounter.   Teodora Medici, [place] 06/10/22 [phone]

## 2022-06-10 NOTE — ED Triage Notes (Signed)
Pt arrived via EMS from [facility] for abdominal pain. UC called because pt was hypotensive 90/70, however he has been stable on the Ambulance 150/90 and 120/70.  HR 70 O2 97% CBG 97.8  IV RH

## 2022-06-10 NOTE — ED Notes (Signed)
Patient is being discharged from the Urgent Care and sent to the Emergency Department via EMS . Per [place], NP, patient is in need of higher level of care due to dehydration. Patient is aware and verbalizes understanding of plan of care.  Vitals:   06/10/22 0822 06/10/22 0833  BP: (S) (!) 78/56 (S) 117/83  Pulse: 85   Resp: 16   Temp: 97.9 F (36.6 C)   SpO2: 95%

## 2022-06-10 NOTE — ED Triage Notes (Signed)
Patient presents to [facility] for abdominal pain x 1 week. States his children had a stomach bug and he caught it. States he has indigestion and diarrhea. Ate yogurt. Pt states he took 4 doses of doxy from a left over prescription. States helped a little. Concerned with an infection.

## 2022-06-10 NOTE — Discharge Instructions (Addendum)
You have been seen and discharged from the emergency department.  Your blood work showed mildly low potassium, you are given replacement here in the department.  Continue to take your potassium supplements at home.  CT scan did not identify any acute finding.  You are most likely suffering from viral gastroenteritis/stomach flu.  Follow-up with your primary provider for further evaluation and further care. Take home medications as prescribed. If you have any worsening symptoms or further concerns for your health please return to an emergency department for further evaluation.

## 2022-06-10 NOTE — ED Notes (Signed)
Provider called EMS report given.

## 2022-06-10 NOTE — ED Provider Notes (Signed)
Patient signed out to me by previous provider. Please refer to their note for full HPI.  Briefly this is a 57 year old male who presents emergency department with nausea, diarrhea and generalized abdominal pain.  Report is that multiple family members have been sick with a stomach bug.  We are pending lab evaluation and CT.  Was noted to be hypotensive as a patient in urgent care, has been normotensive since arrival here.  Blood work is reassuring, no leukocytosis.  Mild hypokalemia at 3.1.  Will give replacement here in the department.  CT shows no acute findings.  On reevaluation patient continues to be normotensive.  Will plan for discharge and outpatient follow-up in regards to gastroenteritis/diarrhea.  Patient at this time appears safe and stable for discharge and close outpatient follow up. Discharge plan and strict return to ED precautions discussed, patient verbalizes understanding and agreement.   Lorelle Gibbs, DO 06/10/22 [id number]

## 2022-06-10 NOTE — ED Provider Notes (Signed)
[facility] Provider Note   CSN: [id number] Arrival date & time: 06/10/22  [id number]     History  Chief Complaint  Patient presents with   Abdominal Pain   Nausea    Billy [name] is a 57 y.o. male with history congestive heart failure, V-fib, ICD placement, EF 40-45% and stable in March 2023 check, presented to the ED with abdominal pain nausea and diarrhea.  The patient reports that multiple family members including his grandchildren had a "stomach bug" about 1 to 2 weeks ago, and he himself began having nausea, with poor appetite and diarrhea about 7 days ago.  He states everyone else is improved and he continues to have persistent diarrhea.  Probably having 1 watery bowel movement per hour.  He does report a distant history of C. difficile.  This was nearly 10 years ago.  He denies that he has been on any recent antibiotics in the past 3 months.  He went to urgent care today where there was concern that the patient was hypotensive with blood pressure 70/50 mmhg he was referred into the ED for evaluation.  He denies lightheadedness, near syncope.  He reports that he has been belching a lot.  He reports a history of cholecystectomy.  He reports he has been compliant with all of his medications including his blood pressure medicines and his 40 mg Lasix that he takes daily for heart failure and edema.  He has a history of paroxysmal A-fib and SFA occlusion and right external iliac stent and is on Eliquis  HPI     Home Medications Prior to Admission medications   Medication Sig Start Date End Date Taking? Authorizing Provider  apixaban (ELIQUIS) 5 MG TABS tablet TAKE 1 TABLET TWICE DAILY 02/15/22   Lelon Perla, MD  atorvastatin (LIPITOR) 80 MG tablet TAKE 1 TABLET EVERY DAY 07/06/21   Deboraha Sprang, MD  carvedilol (COREG) 3.125 MG tablet TAKE 1 TABLET BY MOUTH TWICE A DAY WITH A MEAL 05/10/22   Lelon Perla, MD  clopidogrel (PLAVIX) 75 MG  tablet Take 1 tablet (75 mg total) by mouth daily. 05/16/22   Wellington Hampshire, MD  empagliflozin (JARDIANCE) 10 MG TABS tablet Take 1 tablet (10 mg total) by mouth daily. 05/10/22   Lelon Perla, MD  ezetimibe (ZETIA) 10 MG tablet TAKE 1 TABLET (10 MG TOTAL) BY MOUTH DAILY. 11/18/21   Wellington Hampshire, MD  fluticasone (FLONASE) 50 MCG/ACT nasal spray Place 2 sprays into both nostrils daily as needed for allergies or rhinitis. 06/11/19   Nicolette Bang, MD  furosemide (LASIX) 40 MG tablet TAKE 1 TABLET EVERY DAY 06/29/21   Lelon Perla, MD  isosorbide mononitrate (IMDUR) 30 MG 24 hr tablet Take 0.5 tablets (15 mg total) by mouth daily. 05/22/22   Shirley Friar, PA-C  levothyroxine (SYNTHROID) 50 MCG tablet TAKE 1 TABLET BY MOUTH DAILY BEFORE BREAKFAST. [place]. 04/05/22   [name], MD  montelukast (SINGULAIR) 10 MG tablet TAKE 1 TABLET EVERY DAY AS NEEDED FOR ALLERGIES 09/16/20   Camillia Herter, NP  nitroGLYCERIN (NITROSTAT) 0.4 MG SL tablet DISSOLVE 1 TABLET UNDER THE TONGUE EVERY 5 MINUTES FOR 3 DOSES AS NEEDED FOR CHEST PAIN 04/04/22   Wellington Hampshire, MD  pantoprazole (PROTONIX) 40 MG tablet TAKE 1 TABLET BY MOUTH EVERY DAY 02/28/22   Lelon Perla, MD  polyethylene glycol (MIRALAX / GLYCOLAX) 17 g  packet Take 17 g by mouth daily. 07/09/21   Shelly Coss, MD  potassium chloride SA (KLOR-CON M) 20 MEQ tablet TAKE 1 TABLET EVERY DAY 05/23/21   Lelon Perla, MD  sacubitril-valsartan (ENTRESTO) 24-26 MG TAKE 1 TABLET TWICE DAILY 07/06/21   Deboraha Sprang, MD      Allergies    Patient has no known allergies.    Review of Systems   Review of Systems  Physical Exam Updated Vital Signs BP 121/85   Pulse 74   Temp 98.8 F (37.1 C) (Oral)   Resp 16   Ht '5\' 11"'$  (1.803 m)   Wt 120.2 kg   SpO2 96%   BMI 36.96 kg/m  Physical Exam Constitutional:      General: He is not in acute distress. HENT:     Head: Normocephalic  and atraumatic.  Eyes:     Conjunctiva/sclera: Conjunctivae normal.     Pupils: Pupils are equal, round, and reactive to light.  Cardiovascular:     Rate and Rhythm: Normal rate and regular rhythm.  Pulmonary:     Effort: Pulmonary effort is normal. No respiratory distress.  Abdominal:     General: There is no distension.     Tenderness: There is no abdominal tenderness.  Skin:    General: Skin is warm and dry.  Neurological:     General: No focal deficit present.     Mental Status: He is alert. Mental status is at baseline.  Psychiatric:        Mood and Affect: Mood normal.        Behavior: Behavior normal.     ED Results / Procedures / Treatments   Labs (all labs ordered are listed, but only abnormal results are displayed) Labs Reviewed  CBC WITH DIFFERENTIAL/PLATELET - Abnormal; Notable for the following components:      Result Value   RBC 7.14 (*)    MCV 65.7 (*)    MCH 20.4 (*)    RDW 24.3 (*)    Monocytes Absolute 1.3 (*)    All other components within normal limits  COMPREHENSIVE METABOLIC PANEL - Abnormal; Notable for the following components:   Potassium 3.1 (*)    CO2 20 (*)    Calcium 8.2 (*)    All other components within normal limits  C DIFFICILE QUICK SCREEN W PCR REFLEX    LIPASE, BLOOD  MAGNESIUM    EKG EKG Interpretation  Date/Time:  Saturday June 10 2022 10:39:46 EST Ventricular Rate:  87 PR Interval:  132 QRS Duration: 96 QT Interval:  420 QTC Calculation: 395 R Axis:   -18 Text Interpretation: Sinus rhythm Ventricular bigeminy Confirmed by Octaviano Glow [phone]) on 06/10/2022 2:04:32 PM  Radiology No results found.  Procedures Procedures    Medications Ordered in ED Medications  sodium chloride 0.9 % bolus 500 mL (0 mLs Intravenous Stopped 06/10/22 1356)  famotidine (PEPCID) IVPB 20 mg premix (20 mg Intravenous New Bag/Given 06/10/22 1356)  alum & mag hydroxide-simeth (MAALOX/MYLANTA) 200-200-20 MG/5ML suspension 15 mL (15 mLs Oral  Given 06/10/22 1356)    ED Course/ Medical Decision Making/ A&P Clinical Course as of 06/10/22 1519  Sat Jun 10, 2022  1337 Unclear cause of delay in blood tests, RN is calling lab to confer.  GI medications ordered for gastritis symptoms. C diff is negative. [MT]  1500 Lipase, blood [MT]    Clinical Course User Index [MT] Loie Jahr, Carola Rhine, MD  Medical Decision Making Amount and/or Complexity of Data Reviewed Labs: ordered. Decision-making details documented in ED Course. Radiology: ordered. ECG/medicine tests: ordered.  Risk OTC drugs. Prescription drug management.   This patient presents to the ED with concern for diarrhea, poor appetite, hypotension. This involves an extensive number of treatment options, and is a complaint that carries with it a high risk of complications and morbidity.  The differential diagnosis includes viral gastroenteritis versus recurrent C. difficile infection versus orthostatic hypotension or dehydration most likely as a cause of the patient's hypotension.  I do not see evidence of pulmonary embolism, cardiogenic shock, pulmonary edema, sepsis at this time.  The patient is not having any chest pain or discomfort to suggest that this is a coronary issue causing hypotension  Co-morbidities that complicate the patient evaluation: History of heart failure at high risk of fluid imbalance  Additional history obtained from the patient's wife at bedside  External records from outside source obtained and reviewed including most recent cardiology evaluation from 05/16/22 by [facility] Silos, who notes that the patient has a history of cardiac arrest in November 2010 status post ICD placement, also has a history of CABG.  The patient is also status post stent placement in the right external iliac artery with occlusion of the right SFA, and is on chronic anticoagulation with Eliquis.  He has a history of paroxysmal A-fib.  I ordered and personally  interpreted labs.  The pertinent results include:  CBC near baseline.  Remaining labs were hemolyzed, with delay in lab relaying this information.  Nursing is redrawing labs.  C Diff is negative.  I ordered imaging studies including CT abdomen pelvis, which was pending at the time of signout   The patient was maintained on a cardiac monitor.  I personally viewed and interpreted the cardiac monitored which showed an underlying rhythm of: NSR  Per my interpretation the patient's ECG shows sinus rhythm with ventricular bigeminy  I ordered medication including IV Pepcid and Maalox for reflux.  Small IV fluid bolus ordered initially for hydration given his report of diarrhea.  I am awaiting electrolyte levels and kidney function assessment prior to giving additional fluids, given his known congestive heart failure.  I have reviewed the patients home medicines and have made adjustments as needed  After the interventions noted above, I reevaluated the patient and found that they have: improved -- during his initial 5-hour observation stay in the ED, blood pressure has been stable, even on arrival, no hypotension.  Dispostion:  Patient was signed out to afternoon EDP Dr. Fulton Reek pending f/u on labs and CT imaging.  If there are no emergent findings, the patient can be discharged home with continued symptomatic management.         Final Clinical Impression(s) / ED Diagnoses Final diagnoses:  Diarrhea, unspecified type  Nausea    Rx / DC Orders ED Discharge Orders     None         Wyvonnia Dusky, MD 06/10/22 [phone]

## 2022-06-10 NOTE — ED Notes (Signed)
Recollect of light green tube sent to lab.  

## 2022-06-14 ENCOUNTER — Telehealth: Payer: Self-pay

## 2022-06-14 NOTE — Telephone Encounter (Signed)
     Patient  visit on 06/10/2022  at [facility] was for Abdominal Pain  Nausea.  Have you been able to follow up with your primary care physician? Patient's wife will call and make appointment this week.  The patient was or was not able to obtain any needed medicine or equipment. Patient was able to obtain medication.  Are there diet recommendations that you are having difficulty following? No  Patient expresses understanding of discharge instructions and education provided has no other needs at this time. Yes   [facility] Care Guide   [email or web address]  ?? [phone]   Website: [email or web address]

## 2022-06-15 ENCOUNTER — Ambulatory Visit: Payer: Medicare HMO | Attending: Cardiology | Admitting: Cardiology

## 2022-06-15 ENCOUNTER — Encounter: Payer: Self-pay | Admitting: Cardiology

## 2022-06-15 VITALS — BP 108/76 | HR 87 | Ht 71.0 in | Wt 266.4 lb

## 2022-06-15 DIAGNOSIS — I255 Ischemic cardiomyopathy: Secondary | ICD-10-CM | POA: Diagnosis not present

## 2022-06-15 DIAGNOSIS — Z95810 Presence of automatic (implantable) cardiac defibrillator: Secondary | ICD-10-CM

## 2022-06-15 DIAGNOSIS — E785 Hyperlipidemia, unspecified: Secondary | ICD-10-CM | POA: Diagnosis not present

## 2022-06-15 DIAGNOSIS — I25810 Atherosclerosis of coronary artery bypass graft(s) without angina pectoris: Secondary | ICD-10-CM | POA: Diagnosis not present

## 2022-06-15 DIAGNOSIS — I5022 Chronic systolic (congestive) heart failure: Secondary | ICD-10-CM | POA: Diagnosis not present

## 2022-06-15 DIAGNOSIS — I480 Paroxysmal atrial fibrillation: Secondary | ICD-10-CM

## 2022-06-15 DIAGNOSIS — I739 Peripheral vascular disease, unspecified: Secondary | ICD-10-CM | POA: Diagnosis not present

## 2022-06-15 NOTE — Patient Instructions (Signed)
    Follow-Up: At [facility], you and your health needs are our priority.  As part of our continuing mission to provide you with exceptional heart care, we have created designated Provider Care Teams.  These Care Teams include your primary Cardiologist (physician) and Advanced Practice Providers (APPs -  Physician Assistants and Nurse Practitioners) who all work together to provide you with the care you need, when you need it.  We recommend signing up for the patient portal called "MyChart".  Sign up information is provided on this After Visit Summary.  MyChart is used to connect with patients for Virtual Visits (Telemedicine).  Patients are able to view lab/test results, encounter notes, upcoming appointments, etc.  Non-urgent messages can be sent to your provider as well.   To learn more about what you can do with MyChart, go to [email or web address].    Your next appointment:   6 month(s)  Provider:   Brian Crenshaw, MD      

## 2022-06-16 ENCOUNTER — Other Ambulatory Visit: Payer: Self-pay

## 2022-06-16 ENCOUNTER — Other Ambulatory Visit: Payer: Medicare HMO

## 2022-06-16 ENCOUNTER — Encounter: Payer: Medicare HMO | Admitting: Oncology

## 2022-06-19 ENCOUNTER — Other Ambulatory Visit: Payer: Self-pay | Admitting: Cardiology

## 2022-06-19 ENCOUNTER — Inpatient Hospital Stay: Payer: Medicare HMO | Attending: Oncology

## 2022-06-19 ENCOUNTER — Encounter: Payer: Self-pay | Admitting: Nurse Practitioner

## 2022-06-19 ENCOUNTER — Inpatient Hospital Stay (HOSPITAL_BASED_OUTPATIENT_CLINIC_OR_DEPARTMENT_OTHER): Payer: Medicare HMO | Admitting: Nurse Practitioner

## 2022-06-19 ENCOUNTER — Ambulatory Visit (INDEPENDENT_AMBULATORY_CARE_PROVIDER_SITE_OTHER): Payer: Medicare HMO

## 2022-06-19 VITALS — BP 128/81 | HR 67 | Temp 97.6°F | Resp 16 | Wt 266.6 lb

## 2022-06-19 DIAGNOSIS — Z7901 Long term (current) use of anticoagulants: Secondary | ICD-10-CM

## 2022-06-19 DIAGNOSIS — I2510 Atherosclerotic heart disease of native coronary artery without angina pectoris: Secondary | ICD-10-CM | POA: Diagnosis not present

## 2022-06-19 DIAGNOSIS — Z808 Family history of malignant neoplasm of other organs or systems: Secondary | ICD-10-CM

## 2022-06-19 DIAGNOSIS — Z7984 Long term (current) use of oral hypoglycemic drugs: Secondary | ICD-10-CM | POA: Diagnosis not present

## 2022-06-19 DIAGNOSIS — Z87891 Personal history of nicotine dependence: Secondary | ICD-10-CM | POA: Diagnosis not present

## 2022-06-19 DIAGNOSIS — Z806 Family history of leukemia: Secondary | ICD-10-CM

## 2022-06-19 DIAGNOSIS — E119 Type 2 diabetes mellitus without complications: Secondary | ICD-10-CM | POA: Diagnosis not present

## 2022-06-19 DIAGNOSIS — Z832 Family history of diseases of the blood and blood-forming organs and certain disorders involving the immune mechanism: Secondary | ICD-10-CM | POA: Diagnosis not present

## 2022-06-19 DIAGNOSIS — D751 Secondary polycythemia: Secondary | ICD-10-CM | POA: Diagnosis not present

## 2022-06-19 DIAGNOSIS — I4901 Ventricular fibrillation: Secondary | ICD-10-CM | POA: Diagnosis not present

## 2022-06-19 DIAGNOSIS — I509 Heart failure, unspecified: Secondary | ICD-10-CM | POA: Diagnosis not present

## 2022-06-19 DIAGNOSIS — Z79899 Other long term (current) drug therapy: Secondary | ICD-10-CM | POA: Insufficient documentation

## 2022-06-19 DIAGNOSIS — E291 Testicular hypofunction: Secondary | ICD-10-CM | POA: Diagnosis not present

## 2022-06-19 LAB — FERRITIN: Ferritin: 13 ng/mL — ABNORMAL LOW (ref 24–336)

## 2022-06-19 LAB — IRON AND IRON BINDING CAPACITY (CC-WL,HP ONLY)
Iron: 31 ug/dL — ABNORMAL LOW (ref 45–182)
Saturation Ratios: 6 % — ABNORMAL LOW (ref 17.9–39.5)
TIBC: 489 ug/dL — ABNORMAL HIGH (ref 250–450)
UIBC: 458 ug/dL — ABNORMAL HIGH (ref 117–376)

## 2022-06-19 NOTE — Progress Notes (Addendum)
[place]   Telephone:([phone]) [phone] Fax:([phone]) [place] consult Note   Patient Care Team: [name], MD as PCP - General (Family Medicine) Stanford Breed Denice Bors, MD as PCP - Cardiology (Cardiology) Deboraha Sprang, MD as PCP - Electrophysiology (Cardiology) Lavonna Monarch, MD (Inactive) as Consulting Physician (Dermatology) Alla Feeling, NP as Nurse Practitioner (Hematology and Oncology) 06/19/2022  CHIEF COMPLAINTS/PURPOSE OF CONSULTATION:  Elevated red blood cells, referred by cardiologist Dr. [name]  HISTORY OF PRESENTING ILLNESS:  Billy [name] 57 y.o. male with PMH including extensive cardiac history (CAD, CABG, CHF, PVD, ischemic cardiomyopathy and Vfib arrest), GERD, IBS, HL, DM, hypothyroidism, osteoporotic hip fracture, low testosterone and abnormal CBC is here because of elevated RBC. He had normal CBC in 2009. He suffered Vfib arrest in 02/2009, labs showed mild anemia hgb 11.3 and leukocytosis WBC 13. During that admission WBC up to 29.1, RBC 6.47, Hgb 20, and HCT 58.9. After discharge, he had persistent leukocytosis and mild intermittent anemia hgb 11.3 - 14 range  from 2010 - 2012. Between 2013 - 2014 RBC 5-6 range and hgb 16 - 18.6, but normalized from 09/2012 - 10/2015 . CBC normal 01/2016 - 04/2019. RBC started increasing again 10/2019, 5.9 - 6 with normal H/H through 05/2021. He slipped and fell and fractured left hip 07/01/21, admitted for ORIF. Labs again showed leukocytosis and mild anemia. Further work up showed low testosterone. He underwent ortho surgery 08/2021, anemia worsened to 7.8 in 10/2021 but recovered. By 2/24 CBC showed WBC 11/2, RBC 8.05, Hgb 16.3, HCT 54.9, MCV 67.  This recently he developed abdominal pain and diarrhea he attributes to catching a viral illness from granddaughter, brought to ED for eval.  Lab 06/10/2022 showed normal WBC, hemoglobin, and hematocrit.  RBC elevated 7.14 with low MCV 65.7.  CT abdomen/pelvis  showed no acute findings, no concern for malignancy, no splenomegaly.  Socially he is married, disabled.  Has 3 healthy daughters.  He is independent with ADLs and drives.  He denies alcohol or drug use.  He formally smoked 2 packs/day x 20 years, quit 3 years ago.  His sisters 3 daughters have sickle cell anemia.  A paternal uncle had throat cancer and another paternal uncle had leukemia.  Patient's last PSA was normal in 2022; he had a remote colonoscopy and Cologuard last year which was reportedly normal.  Today he presents with his wife, feeling well in general.  Denies bleeding on Eliquis and Plavix.  Denies fever, night sweats, unintentional weight loss, fatigue, adenopathy, cough, chest pain, dyspnea, recurring infection, leg edema, abdominal pain/bloating, hematochezia, change in bowel habits, pain, or any other specific complaints.   MEDICAL HISTORY:  Past Medical History:  Diagnosis Date   Aortic atherosclerosis ([place])    on CXR   Blood in stool    C. difficile colitis    a. remote hx 2010.   Cardiac arrest - ventricular fibrillation 02/11/2009   a. 02/2009 s/p St Jude ICD.   Chronic combined systolic and diastolic CHF (congestive heart failure) ([name])    Chronic kidney disease, stage 3a ([name])    Coronary artery disease    a. PCI to Cx age 57. b. CABGx4 in 2003. c. BMS to OM1 in 12/2007. d. PTCA to Cx 01/2019.   Former tobacco use    Hyperlipidemia    Hypertension    Ischemic cardiomyopathy    Marijuana abuse    Myocardial infarct ([place])    NON-ST-SEGMENT ELEVATION MI  Obesity    PAD (peripheral artery disease) ([name])    PAF (paroxysmal atrial fibrillation) ([name])    Pathologic fracture of left acetabulum 07/03/2021   PSVT (paroxysmal supraventricular tachycardia)    Ventricular tachycardia ([employer])     SURGICAL HISTORY: Past Surgical History:  Procedure Laterality Date   ABDOMINAL AORTOGRAM W/LOWER EXTREMITY N/A 05/14/2019   Procedure: ABDOMINAL AORTOGRAM W/LOWER EXTREMITY;   Surgeon: Wellington Hampshire, MD;  Location: [facility] [facility];  Service: Cardiovascular;  Laterality: N/A;   CARDIAC DEFIBRILLATOR PLACEMENT     STJ single chamber ICD implanted for secondary prevention   CIRCUMCISION     CORONARY ARTERY BYPASS GRAFT  06/17/01   X 4   CORONARY BALLOON ANGIOPLASTY N/A 01/22/2019   Procedure: CORONARY BALLOON ANGIOPLASTY;  Surgeon: Leonie Man, MD;  Location: [place] [facility];  Service: Cardiovascular;  Laterality: N/A;   HIP CLOSED REDUCTION Left 07/02/2021   Procedure: CLOSED REDUCTION HIP with placement of skeletal traction;  Surgeon: Tania Ade, MD;  Location: [place] [facility];  Service: Orthopedics;  Laterality: Left;   ICD GENERATOR CHANGEOUT N/A 06/17/2021   Procedure: ICD GENERATOR CHANGEOUT;  Surgeon: Deboraha Sprang, MD;  Location: [place] [facility];  Service: Cardiovascular;  Laterality: N/A;   LEFT HEART CATH AND CORS/GRAFTS ANGIOGRAPHY N/A 01/22/2019   Procedure: LEFT HEART CATH AND CORS/GRAFTS ANGIOGRAPHY;  Surgeon: Leonie Man, MD;  Location: [place] [facility];  Service: Cardiovascular;  Laterality: N/A;   LEFT HEART CATH AND CORS/GRAFTS ANGIOGRAPHY N/A 05/18/2020   Procedure: LEFT HEART CATH AND CORS/GRAFTS ANGIOGRAPHY;  Surgeon: Troy Sine, MD;  Location: [place] [facility];  Service: Cardiovascular;  Laterality: N/A;   OPEN REDUCTION INTERNAL FIXATION ACETABULUM FRACTURE POSTERIOR Left 07/04/2021   Procedure: OPEN REDUCTION INTERNAL FIXATION ACETABULUM FRACTURE POSTERIOR;  Surgeon: Altamese Preston, MD;  Location: [facility];  Service: Orthopedics;  Laterality: Left;   PERIPHERAL VASCULAR INTERVENTION Right 05/14/2019   Procedure: PERIPHERAL VASCULAR INTERVENTION;  Surgeon: Wellington Hampshire, MD;  Location: [place] [facility];  Service: Cardiovascular;  Laterality: Right;   SVT ABLATION N/A 06/21/2020   Procedure: SVT ABLATION;  Surgeon: Evans Lance, MD;  Location: [facility];  Service: Cardiovascular;  Laterality: N/A;    SOCIAL  HISTORY: Social History   Socioeconomic History   Marital status: Married    Spouse name: Not on file   Number of children: 3   Years of education: 11   Highest education level: Not on file  Occupational History   Occupation: Disability  Tobacco Use   Smoking status: Former    Packs/day: 2.00    Years: 20.00    Additional pack years: 0.00    Total pack years: 40.00    Types: Cigarettes    Quit date: 2021    Years since quitting: 3.2    Passive exposure: Never   Smokeless tobacco: Never  Vaping Use   Vaping Use: Never used  Substance and Sexual Activity   Alcohol use: No   Drug use: No   Sexual activity: Yes    Partners: Female  Other Topics Concern   Not on file  Social History Narrative   MARRIED   FORMER TOBACCO USE, SMOKED FOR 20 YRS 2 PPD quit 2021   NO ETOH   NO ILLICIT DRUG USE   NO REGULAR EXERCISE         ICD-ST. JUDE ; CERTIFIED LETTER SENT AND RETURNED BAD ADDRESS, PLS UPDATE DJW.  Fun: Fishing    Social Determinants of Health   Financial Resource Strain: Low Risk  (04/24/2022)   Overall Financial Resource Strain (CARDIA)    Difficulty of Paying Living Expenses: Not hard at all  Food Insecurity: No Food Insecurity (04/24/2022)   Hunger Vital Sign    Worried About Running Out of Food in the Last Year: Never true    Ran Out of Food in the Last Year: Never true  Transportation Needs: No Transportation Needs (04/24/2022)   PRAPARE - Hydrologist (Medical): No    Lack of Transportation (Non-Medical): No  Physical Activity: Sufficiently Active (04/24/2022)   Exercise Vital Sign    Days of Exercise per Week: 6 days    Minutes of Exercise per Session: 30 min  Stress: No Stress Concern Present (04/24/2022)   [place]    Feeling of Stress : Not at all  Social Connections: Moderately Integrated (04/24/2022)   Social Connection and Isolation Panel [NHANES]     Frequency of Communication with Friends and Family: More than three times a week    Frequency of Social Gatherings with Friends and Family: More than three times a week    Attends Religious Services: More than 4 times per year    Active Member of [employer] or Organizations: No    Attends Archivist Meetings: Never    Marital Status: Married  Human resources officer Violence: Not At Risk (04/24/2022)   Humiliation, Afraid, Rape, and Kick questionnaire    Fear of Current or Ex-Partner: No    Emotionally Abused: No    Physically Abused: No    Sexually Abused: No    FAMILY HISTORY: Family History  Problem Relation Age of Onset   Hypertension Mother    Heart attack Father        DIED AT 60 FROM MI   Heart disease Sister        CAD AND PREVIOUS CABG   Cancer Paternal Uncle        throat   Cancer Paternal Uncle        leukemia   Hypertension Other        IN MOST OF HIS SIBLINGS    ALLERGIES:  has No Known Allergies.  MEDICATIONS:  Current Outpatient Medications  Medication Sig Dispense Refill   alum & mag hydroxide-simeth (MAALOX MAX) [id number] MG/5ML suspension Take 10 mLs by mouth every 6 (six) hours as needed for indigestion. 355 mL 0   apixaban (ELIQUIS) 5 MG TABS tablet TAKE 1 TABLET TWICE DAILY 180 tablet 3   atorvastatin (LIPITOR) 80 MG tablet TAKE 1 TABLET EVERY DAY 90 tablet 3   carvedilol (COREG) 3.125 MG tablet TAKE 1 TABLET BY MOUTH TWICE A DAY WITH A MEAL 180 tablet 1   clopidogrel (PLAVIX) 75 MG tablet Take 1 tablet (75 mg total) by mouth daily. 90 tablet 3   empagliflozin (JARDIANCE) 10 MG TABS tablet Take 1 tablet (10 mg total) by mouth daily. 30 tablet 11   ezetimibe (ZETIA) 10 MG tablet TAKE 1 TABLET (10 MG TOTAL) BY MOUTH DAILY. 90 tablet 1   fluticasone (FLONASE) 50 MCG/ACT nasal spray Place 2 sprays into both nostrils daily as needed for allergies or rhinitis. 16 g 11   furosemide (LASIX) 40 MG tablet TAKE 1 TABLET EVERY DAY 90 tablet 3   isosorbide  mononitrate (IMDUR) 30 MG 24 hr tablet Take 0.5 tablets (15 mg total) by mouth  daily. 90 tablet 3   levothyroxine (SYNTHROID) 50 MCG tablet TAKE 1 TABLET BY MOUTH DAILY BEFORE BREAKFAST. [place]. 90 tablet 0   montelukast (SINGULAIR) 10 MG tablet TAKE 1 TABLET EVERY DAY AS NEEDED FOR ALLERGIES 90 tablet 1   nitroGLYCERIN (NITROSTAT) 0.4 MG SL tablet DISSOLVE 1 TABLET UNDER THE TONGUE EVERY 5 MINUTES FOR 3 DOSES AS NEEDED FOR CHEST PAIN 25 tablet 3   pantoprazole (PROTONIX) 40 MG tablet TAKE 1 TABLET BY MOUTH EVERY DAY 90 tablet 3   polyethylene glycol (MIRALAX / GLYCOLAX) 17 g packet Take 17 g by mouth daily. 14 each 0   potassium chloride SA (KLOR-CON M) 20 MEQ tablet TAKE 1 TABLET EVERY DAY 90 tablet 3   sacubitril-valsartan (ENTRESTO) 24-26 MG TAKE 1 TABLET TWICE DAILY 180 tablet 3   loperamide (IMODIUM) 2 MG capsule Take 1 capsule (2 mg total) by mouth 4 (four) times daily as needed for diarrhea or loose stools. (Patient not taking: Reported on 06/19/2022) 12 capsule 0   No current facility-administered medications for this visit.    REVIEW OF SYSTEMS:   Constitutional: Denies fevers, chills or abnormal night sweats Eyes: Denies blurriness of vision, double vision or watery eyes Ears, nose, mouth, throat, and face: Denies mucositis or sore throat Respiratory: Denies cough, dyspnea or wheezes Cardiovascular: Denies palpitation, chest discomfort or lower extremity swelling Gastrointestinal:  Denies nausea, heartburn or change in bowel habits Skin: Denies abnormal skin rashes Lymphatics: Denies new lymphadenopathy or easy bruising Neurological:Denies numbness, tingling or new weaknesses Behavioral/Psych: Mood is stable, no new changes  All other systems were reviewed with the patient and are negative.  PHYSICAL EXAMINATION: ECOG PERFORMANCE STATUS: 0 - Asymptomatic  Vitals:   06/19/22 1235  BP: 128/81  Pulse: 67  Resp: 16  Temp: 97.6 F (36.4 C)  SpO2:  97%   Filed Weights   06/19/22 1235  Weight: 266 lb 9.6 oz (120.9 kg)    GENERAL:alert, no distress and comfortable SKIN: No rash EYES: sclera clear NECK: Without mass LYMPH:  no palpable cervical or supraclavicular lymphadenopathy  LUNGS: clear with normal breathing effort HEART: regular rate & rhythm, no lower extremity edema ABDOMEN:abdomen soft, non-tender and normal bowel sounds Musculoskeletal:no cyanosis of digits and no clubbing  PSYCH: alert & oriented x 3 with fluent speech NEURO: no focal motor/sensory deficits  LABORATORY DATA:  I have reviewed the data as listed    Latest Ref Rng & Units 06/10/2022   10:35 AM 05/19/2022    8:22 AM 05/16/2022   10:05 AM  CBC  WBC 4.0 - 10.5 K/uL 10.4  10.3  11.2   Hemoglobin 13.0 - 17.0 g/dL 14.6  16.3  15.2   Hematocrit 39.0 - 52.0 % 46.9  54.9  51.6   Platelets 150 - 400 K/uL 338  346  359        Latest Ref Rng & Units 06/10/2022    2:32 PM 12/23/2021    8:18 AM 09/27/2021    2:47 PM  CMP  Glucose 70 - 99 mg/dL 84  95  101   BUN 6 - 20 mg/dL 17  16  14    Creatinine 0.61 - 1.24 mg/dL 1.03  1.22  1.20   Sodium 135 - 145 mmol/L 138  138  137   Potassium 3.5 - 5.1 mmol/L 3.1  4.4  3.9   Chloride 98 - 111 mmol/L 111  103  102   CO2 22 - 32 mmol/L 20  23  26   Calcium 8.9 - 10.3 mg/dL 8.2  9.6  9.8   Total Protein 6.5 - 8.1 g/dL 6.8     Total Bilirubin 0.3 - 1.2 mg/dL 0.8     Alkaline Phos 38 - 126 U/L 86     AST 15 - 41 U/L 22     ALT 0 - 44 U/L 20        RADIOGRAPHIC STUDIES: I have personally reviewed the radiological images as listed and agreed with the findings in the report. CT ABDOMEN PELVIS W CONTRAST  Result Date: 06/10/2022 CLINICAL DATA:  Diarrhea with cramping abdominal pain for 1 week. EXAM: CT ABDOMEN AND PELVIS WITH CONTRAST TECHNIQUE: Multidetector CT imaging of the abdomen and pelvis was performed using the standard protocol following bolus administration of intravenous contrast. RADIATION DOSE REDUCTION:  This exam was performed according to the departmental dose-optimization program which includes automated exposure control, adjustment of the mA and/or kV according to patient size and/or use of iterative reconstruction technique. CONTRAST:  119mL OMNIPAQUE IOHEXOL 300 MG/ML  SOLN COMPARISON:  09/13/2012 FINDINGS: Lower chest: Unremarkable. Hepatobiliary: No suspicious focal abnormality within the liver parenchyma. Gallbladder is surgically absent. No intrahepatic or extrahepatic biliary dilation. Pancreas: No focal mass lesion. No dilatation of the main duct. No intraparenchymal cyst. No peripancreatic edema. Spleen: No splenomegaly. No focal mass lesion. Adrenals/Urinary Tract: No adrenal nodule or mass. Kidneys unremarkable. No evidence for hydroureter. Bladder is decompressed. Stomach/Bowel: Stomach is moderately distended with food. Duodenum is normally positioned as is the ligament of Treitz. No small bowel wall thickening. No small bowel dilatation. The terminal ileum is normal. The appendix is normal. No gross colonic mass. No colonic wall thickening. Diverticular changes are noted in the left colon without evidence of diverticulitis. Vascular/Lymphatic: There is mild atherosclerotic calcification of the abdominal aorta without aneurysm. There is no gastrohepatic or hepatoduodenal ligament lymphadenopathy. No retroperitoneal or mesenteric lymphadenopathy. No pelvic sidewall lymphadenopathy. Reproductive: The prostate gland and seminal vesicles are unremarkable. Other: No intraperitoneal free fluid. Musculoskeletal: No worrisome lytic or sclerotic osseous abnormality. Status post left total hip replacement. IMPRESSION: 1. No acute findings in the abdomen or pelvis. Specifically, no findings to explain the patient's history of abdominal pain and diarrhea. 2. Left colonic diverticulosis without diverticulitis. 3.  Aortic Atherosclerosis (ICD10-I70.0). Electronically Signed   By: Misty Stanley M.D.   On:  06/10/2022 16:30   VAS US AORTA/IVC/ILIACS  Result Date: 05/29/2022 ABDOMINAL AORTA STUDY Patient Name:  Billy [name]  Date of Exam:   05/29/2022 Medical Rec #: [id number]       Accession #:    [id number] Date of Birth: 10-Mar-1966       Patient Gender: M Patient Age:   70 years Exam Location:  [facility] Procedure:      VAS US AORTA/IVC/ILIACS Referring Phys: Rogue [facility] ARIDA --------------------------------------------------------------------------------  Indications: Patient is s/p right external iliac artery PTA and stenting and              history of left SFA stenting with known occlusion. He reports              stable bilateral claudication symptoms in the bilateral calves              after walking around a block or two. Risk Factors: Hypertension, hyperlipidemia, Diabetes, past history of smoking,               coronary artery disease. Vascular Interventions: Successful self-expanding stent placement to the right  external iliac artery on 05/14/19. Left SFA stenting of                         unknown date with subsequent occlusion. Limitations: Air/bowel gas and obesity. Technically challenging and limited study.  Comparison Study: Previous aorta-iliac duplex 06/20/21 showed aorta-iliac                   atherosclerosis without evidence of focal stenosis. The right                   external iliac artery stent appeared patent. Performing Technologist: Mariane Masters RVT  Examination Guidelines: A complete evaluation includes B-mode imaging, spectral Doppler, color Doppler, and power Doppler as needed of all accessible portions of each vessel. Bilateral testing is considered an integral part of a complete examination. Limited examinations for reoccurring indications may be performed as noted.  Abdominal Aorta Findings: +-------------+-------+----------+----------+---------+--------+--------+ Location     AP (cm)Trans (cm)PSV (cm/s)Waveform ThrombusComments  +-------------+-------+----------+----------+---------+--------+--------+ Proximal                      66        biphasic                  +-------------+-------+----------+----------+---------+--------+--------+ Mid                           40        biphasic                  +-------------+-------+----------+----------+---------+--------+--------+ Distal                        53        biphasic                  +-------------+-------+----------+----------+---------+--------+--------+ RT CIA Prox                   75        biphasic                  +-------------+-------+----------+----------+---------+--------+--------+ RT CIA Mid                    68        biphasic                  +-------------+-------+----------+----------+---------+--------+--------+ RT CIA Distal                 101       triphasic                 +-------------+-------+----------+----------+---------+--------+--------+ RT EIA Prox                   66        triphasic                 +-------------+-------+----------+----------+---------+--------+--------+ RT EIA Mid                    109       triphasic                 +-------------+-------+----------+----------+---------+--------+--------+ RT EIA Distal                 96        triphasic                 +-------------+-------+----------+----------+---------+--------+--------+  LT CIA Prox                   44        biphasic                  +-------------+-------+----------+----------+---------+--------+--------+ LT CIA Mid                    61        biphasic                  +-------------+-------+----------+----------+---------+--------+--------+ LT CIA Distal                 70        triphasic                 +-------------+-------+----------+----------+---------+--------+--------+ LT EIA Prox                   91        triphasic                  +-------------+-------+----------+----------+---------+--------+--------+ LT EIA Mid                    174       triphasic                 +-------------+-------+----------+----------+---------+--------+--------+ LT EIA Distal                 90        triphasic                 +-------------+-------+----------+----------+---------+--------+--------+ Visualization of the [place] artery, Proximal Abdominal Aorta, Mid Abdominal Aorta, Distal Abdominal Aorta and Right CIA Proximal artery was limited. Unable to visualize stent struts in the right external iliac artery, velocities are listed in the native table. IVC/Iliac Findings: +--------+------+--------+--------+   IVC   PatentThrombusComments +--------+------+--------+--------+ IVC Proxpatent                 +--------+------+--------+--------+    Summary: Technically challenging examination. Abdominal Aorta: The largest aortic measurement is 2.2 cm. Stenosis: Aorta-iliac atherosclerosis without evidence of focal stenosis. Patent right external iliac artery, s/p stent placement. IVC/Iliac: There is no evidence of thrombus involving the IVC.  *See table(s) above for measurements and observations. See ABI report. Suggest follow up study in 12 months.  Electronically signed by Jenkins Rouge MD on 05/29/2022 at 10:52:51 AM.    Final    VAS [place] ABI WITH/WO TBI  Result Date: 05/29/2022  LOWER EXTREMITY DOPPLER STUDY Patient Name:  Billy [name]  Date of Exam:   05/29/2022 Medical Rec #: [id number]       Accession #:    [id number] Date of Birth: [birth year]       Patient Gender: M Patient Age:   32 years Exam Location:  [facility] Procedure:      VAS [place] ABI WITH/WO TBI Referring Phys: [facility] ARIDA --------------------------------------------------------------------------------  Indications: Patient is s/p right external iliac artery PTA and stenting and              history of left SFA stenting with known occlusion. He reports               stable bilateral claudication symptoms in the bilateral              calves after walking around a block or two. High Risk Factors: Hypertension, hyperlipidemia, Diabetes, past history of  smoking, coronary artery disease.  Vascular Interventions: Successful self-expanding stent placement to the right                         external iliac artery on 05/14/19. Left SFA stenting of                         unknown date with subsequent occlusion. Comparison Study: Previous ABIs performed 06/20/21 were 0.85 on the right and                   0.82 on the left. Performing Technologist: Mariane Masters RVT  Examination Guidelines: A complete evaluation includes at minimum, Doppler waveform signals and systolic blood pressure reading at the level of bilateral brachial, anterior tibial, and posterior tibial arteries, when vessel segments are accessible. Bilateral testing is considered an integral part of a complete examination. Photoelectric Plethysmograph (PPG) waveforms and toe systolic pressure readings are included as required and additional duplex testing as needed. Limited examinations for reoccurring indications may be performed as noted.  ABI Findings: +---------+------------------+-----+----------+--------+ Right    Rt Pressure (mmHg)IndexWaveform  Comment  +---------+------------------+-----+----------+--------+ Brachial 109                                       +---------+------------------+-----+----------+--------+ PTA      81                0.74 monophasic         +---------+------------------+-----+----------+--------+ PERO     86                0.79 monophasic         +---------+------------------+-----+----------+--------+ DP       80                0.73 monophasic         +---------+------------------+-----+----------+--------+ Great Toe87                0.80 Normal             +---------+------------------+-----+----------+--------+  +---------+------------------+-----+----------+-------+ Left     Lt Pressure (mmHg)IndexWaveform  Comment +---------+------------------+-----+----------+-------+ Brachial 109                                      +---------+------------------+-----+----------+-------+ PTA                             absent            +---------+------------------+-----+----------+-------+ PERO     70                0.64 monophasic        +---------+------------------+-----+----------+-------+ DP       60                0.55 monophasic        +---------+------------------+-----+----------+-------+ Great Toe72                0.66 Abnormal          +---------+------------------+-----+----------+-------+ +-------+-----------+-----------+------------+------------+ ABI/TBIToday's ABIToday's TBIPrevious ABIPrevious TBI +-------+-----------+-----------+------------+------------+ Right  0.79       0.80       0.85        0.79         +-------+-----------+-----------+------------+------------+  Left   0.64       0.66       0.82        0.83         +-------+-----------+-----------+------------+------------+  Right ABIs appear essentially unchanged compared to prior study on 06/20/21. Left ABIs appear decreased compared to prior study on 06/20/21 but unchanged from exam on 06/09/20.  Summary: Right: Resting right ankle-brachial index indicates moderate right lower extremity arterial disease. The right toe-brachial index is normal. Left: Resting left ankle-brachial index indicates moderate left lower extremity arterial disease. The left toe-brachial index is abnormal. *See table(s) above for measurements and observations. See aorta-iliac duplex report. Suggest follow up study in 12 months. Electronically signed by Jenkins Rouge MD on 05/29/2022 at 10:47:50 AM.    Final     ASSESSMENT & PLAN: 57 year old male  Erythrocytosis/polycythemia -We reviewed his extensive medical record in detail with the  patient and spouse.  -He had normal CBC In 2009, became abnormal after Vfib arrest in 02/2009. He has had mild intermittent leukocytosis, anemia, microcytosis, and erythrocytosis since then, exacerbated during hospital admissions for hip fracture and other acute conditions -We discussed possible benign/secondary etiologies, including dehydration, hormone supplement (which he is not on), OSA (wife reports snoring), lung disease or recent smoking. I recommend referral for sleep study to r/o OSA -He quit smoking 3 years ago, after smoking 2 PPD x20 years. He may have underlying lung disease. -No history of malignancy or abnormal findings on recent CT AP 06/10/22; no splenomegaly -Given the intermittent nature, this is unlikely MPN/polycythemia vera, but will check EPO and JAK2 to rule it out. If positive, he would need phlebotomy program. -If negative, this is secondary. We discussed the risk of thrombosis is lower in secondary polycythemia.  -Will f/up on test results   Microcytosis -He has intermittent microcytosis, with and without anemia.  -Given that he may have microscopic bleeding from eliquis and plavix, will r/o iron deficiency which can cause microcytosis -3 [place] have sickle cell anemia (sister's children)  V-fib arrest, CAD, CHF, HL, DM -On eliquis, plavix, lipitor, entresto, and jardiance per cardiology and PCP -Per pt these conditions are controlled lately     Low testosterone -Found on workup for hip fracture in 06/2021 -Told to follow-up with endocrinology after discharge but was not referred. -Informed/cautioned patient that hormone supplement/testosterone injection can cause secondary polycythemia -Defer to PCP  Health maintenance -I encouraged him to continue healthy active lifestyle, avoid smoking, limit alcohol, hydrate adequately, and engage in age-appropriate cancer screenings -Last PSA in 2022, normal -Overdue for colonoscopy, reportedly recent Cologuard was normal -He  has intermittent leukocytosis, with elevated neutrophils and monocytes. Denies recurrent infection. He does have allergies, on singulair and flonase   PLAN: -Medical record reviewed -Lab today (r/o iron deficiency and MPN/PV - check JAK2 and EPO), will call with results -R/o OSA and lung disease, which can cause secondary polycythemia -F/up pending results -Pt seen with Dr. Burr Medico    Orders Placed This Encounter  Procedures   Ferritin    Standing Status:   Standing    Number of Occurrences:   1    Standing Expiration Date:   06/19/2023   Iron and Iron Binding Capacity ([facility] only)    Standing Status:   Standing    Number of Occurrences:   1    Standing Expiration Date:   06/19/2023   Erythropoietin    Standing Status:   Standing    Number of Occurrences:   1  Standing Expiration Date:   06/19/2023   JAK2 (INCLUDING V617F AND EXON 12), MPL,& CALR W/RFL MPN PANEL (NGS)    Standing Status:   Future    Number of Occurrences:   1    Standing Expiration Date:   06/19/2023      All questions were answered. The patient knows to call the clinic with any problems, questions or concerns.      Alla Feeling, NP 06/19/22    Addendum I have seen the patient, examined him. I agree with the assessment and and plan and have edited the notes.   57 year old gentleman with past medical history significant for coronary artery disease, CHF, diabetes, low testosterone, who was referred for evaluation of erythrocytosis.  He had intermittent anemia in the past, one year ago after hip fracture and surgery. His recent H/H has been high, with highest HCT 55% a month ago. He likely has iron deficiency, given the recent worsening microcytosis, and being on Eliquis and Plavix.  Iron deficiency can certainly mask erythrocytosis.  We discussed the potential etiology of erythrocytosis, including polycythemia vera, and secondary polycythemia, from long-term smoking, COPD, sleep apnea it etc.  Will check  erythropoietin level, iron study today and JAK2 mutation. We will call him with the lab results in a few weeks.  If JAK2 negative, then his erythrocytosis is likely secondary, which would not require phlebotomy or medical treatment.  Questions were answered.  Truitt Merle MD 06/19/2022

## 2022-06-20 ENCOUNTER — Other Ambulatory Visit: Payer: Self-pay

## 2022-06-20 DIAGNOSIS — E039 Hypothyroidism, unspecified: Secondary | ICD-10-CM

## 2022-06-20 LAB — ERYTHROPOIETIN: Erythropoietin: 25.1 m[IU]/mL — ABNORMAL HIGH (ref 2.6–18.5)

## 2022-06-20 MED ORDER — LEVOTHYROXINE SODIUM 50 MCG PO TABS: ORAL_TABLET | ORAL | 0 refills | Status: DC

## 2022-06-21 LAB — CUP PACEART REMOTE DEVICE CHECK
Battery Remaining Longevity: 114 mo
Battery Remaining Percentage: 91 %
Battery Voltage: 3.01 V
Brady Statistic RV Percent Paced: 1 %
Date Time Interrogation Session: 20240319143656
HighPow Impedance: 56 Ohm
Implantable Lead Connection Status: 753985
Implantable Lead Implant Date: 20101111
Implantable Lead Location: 753860
Implantable Lead Model: 7121
Implantable Pulse Generator Implant Date: 20230317
Lead Channel Impedance Value: 530 Ohm
Lead Channel Pacing Threshold Amplitude: 1 V
Lead Channel Pacing Threshold Pulse Width: 0.5 ms
Lead Channel Sensing Intrinsic Amplitude: 12 mV
Lead Channel Setting Pacing Amplitude: 2.5 V
Lead Channel Setting Pacing Pulse Width: 0.5 ms
Lead Channel Setting Sensing Sensitivity: 0.5 mV
Pulse Gen Serial Number: 210001302
Zone Setting Status: 755011

## 2022-06-23 ENCOUNTER — Telehealth: Payer: Self-pay | Admitting: Cardiology

## 2022-06-23 MED ORDER — EMPAGLIFLOZIN 10 MG PO TABS: 10.0000 mg | ORAL_TABLET | Freq: Every day | ORAL | 3 refills | Status: DC

## 2022-06-23 MED ORDER — CARVEDILOL 3.125 MG PO TABS: ORAL_TABLET | ORAL | 3 refills | Status: DC

## 2022-06-23 MED ORDER — PANTOPRAZOLE SODIUM 40 MG PO TBEC: 40.0000 mg | DELAYED_RELEASE_TABLET | Freq: Every day | ORAL | 3 refills | Status: DC

## 2022-06-23 NOTE — Telephone Encounter (Signed)
*  STAT* If patient is at the pharmacy, call can be transferred to refill team.   1. Which medications need to be refilled? (please list name of each medication and dose if known) carvedilol (COREG) 3.125 MG tablet empagliflozin (JARDIANCE) 10 MG TABS tablet pantoprazole (PROTONIX) 40 MG tablet  2. Which pharmacy/location (including street and city if local pharmacy) is medication to be sent to? [place], [place]   3. Do they need a 30 day or 90 day supply? [place]

## 2022-06-23 NOTE — Telephone Encounter (Signed)
Refills have been sent to [facility].

## 2022-06-26 LAB — JAK2 (INCLUDING V617F AND EXON 12), MPL,& CALR W/RFL MPN PANEL (NGS)

## 2022-06-27 ENCOUNTER — Telehealth: Payer: Self-pay

## 2022-06-27 NOTE — Telephone Encounter (Signed)
Faxed last office note to [facility] as per their request that we received in a fax.

## 2022-06-28 ENCOUNTER — Telehealth: Payer: Self-pay | Admitting: Family Medicine

## 2022-06-28 ENCOUNTER — Telehealth: Payer: Self-pay | Admitting: Nurse Practitioner

## 2022-06-28 DIAGNOSIS — E611 Iron deficiency: Secondary | ICD-10-CM

## 2022-06-28 DIAGNOSIS — D751 Secondary polycythemia: Secondary | ICD-10-CM

## 2022-06-28 NOTE — Telephone Encounter (Signed)
Pt wife called said the pt need a sleep study, referral ,pt need colonoscopy,cancer center is going to send some information on that . Please schedule the appt he needs and call tthe pt back

## 2022-06-28 NOTE — Telephone Encounter (Signed)
Patient due for follow up appointment scheduled to come in for evaluation and to discuss referrals.

## 2022-06-28 NOTE — Telephone Encounter (Signed)
I called Billy [name] and Billy [name] and reviewed his lab results. JAK2 is negative, he does not have MPN/PV. Polycythemia is likely secondary/reactive from smoking history. I also recommend sleep study to r/o OSA.   He has iron deficiency, likely from plavix/eliquis. However, he is overdue for colonoscopy so I recommend endoscopies to r/o GI bleeding source. He has not had EGD in the past, will defer to GI on this.   I recommend to start oral iron once daily, either Mens multivitamin with iron or dedicated oral iron tablet, whichever he can tolerate. Given his erythrocytosis I do not recommend IV iron for now, but will monitor.   He will see PCP soon and would like to get GI and sleep study referrals from them, I will cc this note. No other questions at this time.   Will check labs in 3 and 6 months and see him back in 6 months. Patient appreciated the call.   [name], NP  Orders Placed This Encounter  Procedures   CBC with Differential ([facility] Only)    Standing Status:   Standing    Number of Occurrences:   50    Standing Expiration Date:   06/28/2023   Ferritin    Standing Status:   Standing    Number of Occurrences:   50    Standing Expiration Date:   06/28/2023   Iron and Iron Binding Capacity ([facility] only)    Standing Status:   Standing    Number of Occurrences:   50    Standing Expiration Date:   06/28/2023

## 2022-06-30 ENCOUNTER — Telehealth: Payer: Self-pay | Admitting: Hematology

## 2022-06-30 NOTE — Telephone Encounter (Signed)
Contacted patient to scheduled appointments. Left message with appointment details and a call back number if patient had any questions or could not accommodate the time we provided.   

## 2022-07-01 ENCOUNTER — Other Ambulatory Visit: Payer: Self-pay | Admitting: Cardiology

## 2022-07-03 NOTE — Telephone Encounter (Signed)
Refill sent.

## 2022-07-05 ENCOUNTER — Other Ambulatory Visit: Payer: Self-pay | Admitting: Cardiovascular Disease

## 2022-07-06 ENCOUNTER — Other Ambulatory Visit: Payer: Self-pay | Admitting: Family Medicine

## 2022-07-06 DIAGNOSIS — E039 Hypothyroidism, unspecified: Secondary | ICD-10-CM

## 2022-07-12 ENCOUNTER — Other Ambulatory Visit: Payer: Self-pay | Admitting: Internal Medicine

## 2022-07-12 ENCOUNTER — Ambulatory Visit (INDEPENDENT_AMBULATORY_CARE_PROVIDER_SITE_OTHER): Payer: Medicare HMO | Admitting: Family Medicine

## 2022-07-12 ENCOUNTER — Encounter: Payer: Self-pay | Admitting: Family Medicine

## 2022-07-12 VITALS — BP 116/74 | HR 89 | Temp 97.8°F | Ht 71.0 in | Wt 270.0 lb

## 2022-07-12 DIAGNOSIS — Z951 Presence of aortocoronary bypass graft: Secondary | ICD-10-CM

## 2022-07-12 DIAGNOSIS — R7989 Other specified abnormal findings of blood chemistry: Secondary | ICD-10-CM

## 2022-07-12 DIAGNOSIS — Z1211 Encounter for screening for malignant neoplasm of colon: Secondary | ICD-10-CM

## 2022-07-12 DIAGNOSIS — R0683 Snoring: Secondary | ICD-10-CM | POA: Diagnosis not present

## 2022-07-12 DIAGNOSIS — J302 Other seasonal allergic rhinitis: Secondary | ICD-10-CM | POA: Diagnosis not present

## 2022-07-12 DIAGNOSIS — Z125 Encounter for screening for malignant neoplasm of prostate: Secondary | ICD-10-CM

## 2022-07-12 DIAGNOSIS — E039 Hypothyroidism, unspecified: Secondary | ICD-10-CM

## 2022-07-12 LAB — URINALYSIS, ROUTINE W REFLEX MICROSCOPIC
Bilirubin Urine: NEGATIVE
Hgb urine dipstick: NEGATIVE
Ketones, ur: NEGATIVE
Leukocytes,Ua: NEGATIVE
Nitrite: NEGATIVE
RBC / HPF: NONE SEEN (ref 0–?)
Specific Gravity, Urine: 1.01 (ref 1.000–1.030)
Total Protein, Urine: NEGATIVE
Urine Glucose: >=1000 — AB
Urobilinogen, UA: 0.2 (ref 0.0–1.0)
WBC, UA: NONE SEEN (ref 0–?)
pH: 5.5 (ref 5.0–8.0)

## 2022-07-12 LAB — TSH: TSH: 2.55 u[IU]/mL (ref 0.35–5.50)

## 2022-07-12 LAB — PSA: PSA: 0.43 ng/mL (ref 0.10–4.00)

## 2022-07-12 MED ORDER — FLUTICASONE PROPIONATE 50 MCG/ACT NA SUSP
2.0000 | Freq: Every day | NASAL | 11 refills | Status: DC | PRN
Start: 2022-07-12 — End: 2023-02-08

## 2022-07-12 MED ORDER — LEVOTHYROXINE SODIUM 50 MCG PO TABS: ORAL_TABLET | ORAL | 0 refills | Status: DC

## 2022-07-12 NOTE — Progress Notes (Signed)
Established Patient Office Visit   Subjective:  Patient ID: Billy [name], male    DOB: [birth year]  Age: 57 y.o. MRN: [id number]  Chief Complaint  Patient presents with   Medical Management of Chronic Issues    Routine follow up patient needing referral for sleep study and colonoscopy. Patient fasting would like testosterone checked.     HPI Encounter Diagnoses  Name Primary?   Colon cancer screening Yes   Hypothyroidism, unspecified type    Seasonal allergies    Low testosterone in male    Snores    Screening PSA (prostate specific antigen)    For follow-up of the above. Longstanding history snoring with evidence for apnea.  Willing to finally go for a sleep study.  Colon cancer screening.  Reports compliance with levothyroxine.  Pends Recovered from his hip fracture.  Continues to have some difficulty exercising.   Review of Systems  Constitutional: Negative.   HENT: Negative.    Eyes:  Negative for blurred vision, discharge and redness.  Respiratory: Negative.    Cardiovascular: Negative.   Gastrointestinal:  Negative for abdominal pain.  Genitourinary: Negative.   Musculoskeletal: Negative.  Negative for myalgias.  Skin:  Negative for rash.  Neurological:  Negative for tingling, loss of consciousness and weakness.  Endo/Heme/Allergies:  Negative for polydipsia.     Current Outpatient Medications:    apixaban (ELIQUIS) 5 MG TABS tablet, TAKE 1 TABLET TWICE DAILY, Disp: 180 tablet, Rfl: 3   atorvastatin (LIPITOR) 80 MG tablet, TAKE 1 TABLET EVERY DAY, Disp: 90 tablet, Rfl: 3   carvedilol (COREG) 3.125 MG tablet, TAKE 1 TABLET BY MOUTH TWICE A DAY WITH A MEAL, Disp: 180 tablet, Rfl: 3   clopidogrel (PLAVIX) 75 MG tablet, Take 1 tablet (75 mg total) by mouth daily., Disp: 90 tablet, Rfl: 3   empagliflozin (JARDIANCE) 10 MG TABS tablet, Take 1 tablet (10 mg total) by mouth daily., Disp: 90 tablet, Rfl: 3   ezetimibe (ZETIA) 10 MG tablet, TAKE 1 TABLET (10 MG TOTAL) BY  MOUTH DAILY., Disp: 90 tablet, Rfl: 1   furosemide (LASIX) 40 MG tablet, TAKE 1 TABLET EVERY DAY, Disp: 90 tablet, Rfl: 3   isosorbide mononitrate (IMDUR) 30 MG 24 hr tablet, Take 0.5 tablets (15 mg total) by mouth daily., Disp: 90 tablet, Rfl: 3   montelukast (SINGULAIR) 10 MG tablet, TAKE 1 TABLET EVERY DAY AS NEEDED FOR ALLERGIES, Disp: 90 tablet, Rfl: 1   nitroGLYCERIN (NITROSTAT) 0.4 MG SL tablet, DISSOLVE 1 TABLET UNDER THE TONGUE EVERY 5 MINUTES FOR 3 DOSES AS NEEDED FOR CHEST PAIN, Disp: 25 tablet, Rfl: 11   pantoprazole (PROTONIX) 40 MG tablet, Take 1 tablet (40 mg total) by mouth daily., Disp: 90 tablet, Rfl: 3   polyethylene glycol (MIRALAX / GLYCOLAX) 17 g packet, Take 17 g by mouth daily., Disp: 14 each, Rfl: 0   potassium chloride SA (KLOR-CON M) 20 MEQ tablet, TAKE 1 TABLET EVERY DAY, Disp: 90 tablet, Rfl: 3   sacubitril-valsartan (ENTRESTO) 24-26 MG, TAKE 1 TABLET TWICE DAILY, Disp: 180 tablet, Rfl: 3   alum & mag hydroxide-simeth (MAALOX MAX) 400-400-40 MG/5ML suspension, Take 10 mLs by mouth every 6 (six) hours as needed for indigestion. (Patient not taking: Reported on 07/12/2022), Disp: 355 mL, Rfl: 0   fluticasone (FLONASE) 50 MCG/ACT nasal spray, Place 2 sprays into both nostrils daily as needed for allergies or rhinitis., Disp: 16 g, Rfl: 11   levothyroxine (SYNTHROID) 50 MCG tablet, TAKE 1 TABLET BY MOUTH DAILY  BEFORE BREAKFAST. 30 MINUTES BEFORE HAVING BREAKFAST., Disp: 90 tablet, Rfl: 0   Objective:     BP 116/74 (BP Location: Right Arm, Patient Position: Sitting, Cuff Size: Large)   Pulse 89   Temp 97.8 F (36.6 C) (Temporal)   Ht 5\' 11"  (1.803 m)   Wt 270 lb (122.5 kg)   SpO2 93%   BMI 37.66 kg/m    Physical Exam Constitutional:      General: He is not in acute distress.    Appearance: Normal appearance. He is not ill-appearing, toxic-appearing or diaphoretic.  HENT:     Head: Normocephalic and atraumatic.     Right Ear: Tympanic membrane, ear canal and  external ear normal.     Left Ear: Tympanic membrane, ear canal and external ear normal.     Mouth/Throat:     Mouth: Mucous membranes are moist.     Pharynx: Oropharynx is clear. No oropharyngeal exudate or posterior oropharyngeal erythema.   Eyes:     General: No scleral icterus.       Right eye: No discharge.        Left eye: No discharge.     Extraocular Movements: Extraocular movements intact.     Conjunctiva/sclera: Conjunctivae normal.     Pupils: Pupils are equal, round, and reactive to light.  Cardiovascular:     Rate and Rhythm: Normal rate and regular rhythm.  Pulmonary:     Effort: Pulmonary effort is normal. No respiratory distress.     Breath sounds: Normal breath sounds.  Abdominal:     General: Bowel sounds are normal.  Musculoskeletal:     Cervical back: No rigidity or tenderness.  Skin:    General: Skin is warm and dry.  Neurological:     Mental Status: He is alert and oriented to person, place, and time.  Psychiatric:        Mood and Affect: Mood normal.        Behavior: Behavior normal.      No results found for any visits on 07/12/22.    The ASCVD Risk score (Arnett DK, et al., 2019) failed to calculate for the following reasons:   The patient has a prior MI or stroke diagnosis    Assessment & Plan:   Colon cancer screening -     Ambulatory referral to Gastroenterology  Hypothyroidism, unspecified type -     Levothyroxine Sodium; TAKE 1 TABLET BY MOUTH DAILY BEFORE BREAKFAST. 30 MINUTES BEFORE HAVING BREAKFAST.  Dispense: 90 tablet; Refill: 0 -     TSH -     Urinalysis, Routine w reflex microscopic  Seasonal allergies -     Fluticasone Propionate; Place 2 sprays into both nostrils daily as needed for allergies or rhinitis.  Dispense: 16 g; Refill: 11  Low testosterone in male -     Testosterone Total,Free,Bio, Males  Snores -     Ambulatory referral to Pulmonology  Screening PSA (prostate specific antigen) -     PSA    Return in  about 3 months (around 10/11/2022).    Mliss Sax, MD

## 2022-07-13 LAB — TESTOSTERONE TOTAL,FREE,BIO, MALES
Albumin: 4.1 g/dL (ref 3.6–5.1)
Sex Hormone Binding: 33 nmol/L (ref 22–77)
Testosterone, Bioavailable: 120.4 ng/dL (ref 110.0–575.0)
Testosterone, Free: 64 pg/mL (ref 46.0–224.0)
Testosterone: 466 ng/dL (ref 250–827)

## 2022-07-25 ENCOUNTER — Encounter: Payer: Self-pay | Admitting: Nurse Practitioner

## 2022-07-25 ENCOUNTER — Ambulatory Visit (INDEPENDENT_AMBULATORY_CARE_PROVIDER_SITE_OTHER): Payer: Medicare HMO | Admitting: Nurse Practitioner

## 2022-07-25 VITALS — BP 130/70 | HR 76 | Temp 97.6°F | Ht 71.0 in | Wt 269.0 lb

## 2022-07-25 DIAGNOSIS — G4719 Other hypersomnia: Secondary | ICD-10-CM

## 2022-07-25 DIAGNOSIS — R0683 Snoring: Secondary | ICD-10-CM

## 2022-07-25 DIAGNOSIS — I5022 Chronic systolic (congestive) heart failure: Secondary | ICD-10-CM

## 2022-07-25 DIAGNOSIS — E669 Obesity, unspecified: Secondary | ICD-10-CM

## 2022-07-25 NOTE — Patient Instructions (Signed)
Given your symptoms, I am concerned that you may have sleep disordered breathing with sleep apnea. You will need a sleep study for further evaluation. Someone will contact you to schedule this.   We discussed how untreated sleep apnea puts an individual at risk for cardiac arrhthymias, pulm HTN, DM, stroke and increases their risk for daytime accidents. We also briefly reviewed treatment options including weight loss, side sleeping position, oral appliance, CPAP therapy or referral to ENT for possible surgical options  Use caution when driving and pull over if you become sleepy.  Follow up in 6 weeks with [name],NP to go over sleep study results, or sooner, if needed   

## 2022-07-25 NOTE — Progress Notes (Signed)
@Patient  ID: Billy [name], male    DOB: 07-14-1965, 57 y.o.   MRN: [id number]  Chief Complaint  Patient presents with   sleep consult    Pt is being referred due to snoring at night. Pt has never had a sleep study performed.  Pt also states that he does stop breathing at night. Pt said that he does feel rested in the morning when he wakes up.    Referring provider: Mliss Sax  HPI: 57 year old male, former smoker referred for sleep consult.  Past medical history significant for hypertension, CAD s/p CABG, ischemic cardiomyopathy status post ICD, HFrEF, GERD, IBS, hypothyroidism, DM, CKD, obesity.  TEST/EVENTS:   07/25/2022: Today-sleep consult Patient resents today for sleep consult, referred by Dr. Doreene Burke.  He has had trouble with his sleep for a while now.  He has been told that he snores and stops breathing when he sleeps at night.  Does have some daytime fatigue symptoms.  Occasionally feels like his sleep is not as restful as it should be.  Does usually wake up with dry mouth.  He has some occasional sleep talking.  Denies any sleepwalking, drowsy driving, morning headaches, nightmares, sleep paralysis.  No history of narcolepsy or symptoms of cataplexy. He goes to bed around 9 PM.  Falls asleep within an hour.  Usually wakes once to use the restroom.  Officially gets up around 6:30 AM.  He has gained 20 pounds over the last 2 years.  Never had a previous sleep study before He has a history of high blood pressure, MI, heart failure and heart rhythm problems.  No history of stroke. He is a former smoker.  Does not drink any alcohol.  He is on disability.  Lives at home with his wife.  Family history of heart disease.  Epworth 10  No Known Allergies  Immunization History  Administered Date(s) Administered   Influenza,inj,Quad PF,6+ Mos 12/10/2013, 03/01/2021   Moderna Sars-Covid-2 Vaccination 10/11/2019, 11/08/2019   PNEUMOCOCCAL CONJUGATE-20 03/01/2021   Tdap  11/02/2014    Past Medical History:  Diagnosis Date   Aortic atherosclerosis ([name])    on CXR   Blood in stool    C. difficile colitis    a. remote hx 2010.   Cardiac arrest - ventricular fibrillation 02/11/2009   a. 02/2009 s/p St Jude ICD.   Chronic combined systolic and diastolic CHF (congestive heart failure) ([name])    Chronic kidney disease, stage 3a ([name])    Coronary artery disease    a. PCI to Cx age 59. b. CABGx4 in 2003. c. BMS to OM1 in 12/2007. d. PTCA to Cx 01/2019.   Former tobacco use    Hyperlipidemia    Hypertension    Ischemic cardiomyopathy    Marijuana abuse    Myocardial infarct ([name])    NON-ST-SEGMENT ELEVATION MI   Obesity    PAD (peripheral artery disease) ([name])    PAF (paroxysmal atrial fibrillation) ([name])    Pathologic fracture of left acetabulum 07/03/2021   PSVT (paroxysmal supraventricular tachycardia)    Ventricular tachycardia ([name])     Tobacco History: Social History   Tobacco Use  Smoking Status Former   Packs/day: 2.00   Years: 20.00   Additional pack years: 0.00   Total pack years: 40.00   Types: Cigarettes   Quit date: 2021   Years since quitting: 3.3   Passive exposure: Never  Smokeless Tobacco Never   Counseling given: Not Answered   Outpatient Medications Prior to Visit  Medication Sig Dispense Refill   alum & mag hydroxide-simeth (MAALOX MAX) 400-400-40 MG/5ML suspension Take 10 mLs by mouth every 6 (six) hours as needed for indigestion. 355 mL 0   apixaban (ELIQUIS) 5 MG TABS tablet TAKE 1 TABLET TWICE DAILY 180 tablet 3   atorvastatin (LIPITOR) 80 MG tablet TAKE 1 TABLET EVERY DAY 90 tablet 2   carvedilol (COREG) 3.125 MG tablet TAKE 1 TABLET BY MOUTH TWICE A DAY WITH A MEAL 180 tablet 3   clopidogrel (PLAVIX) 75 MG tablet Take 1 tablet (75 mg total) by mouth daily. 90 tablet 3   empagliflozin (JARDIANCE) 10 MG TABS tablet Take 1 tablet (10 mg total) by mouth daily. 90 tablet 3   ezetimibe (ZETIA) 10 MG tablet TAKE 1  TABLET (10 MG TOTAL) BY MOUTH DAILY. 90 tablet 1   fluticasone (FLONASE) 50 MCG/ACT nasal spray Place 2 sprays into both nostrils daily as needed for allergies or rhinitis. 16 g 11   furosemide (LASIX) 40 MG tablet TAKE 1 TABLET EVERY DAY 90 tablet 3   isosorbide mononitrate (IMDUR) 30 MG 24 hr tablet Take 0.5 tablets (15 mg total) by mouth daily. 90 tablet 3   levothyroxine (SYNTHROID) 50 MCG tablet TAKE 1 TABLET BY MOUTH DAILY BEFORE BREAKFAST. 30 MINUTES BEFORE HAVING BREAKFAST. 90 tablet 0   montelukast (SINGULAIR) 10 MG tablet TAKE 1 TABLET EVERY DAY AS NEEDED FOR ALLERGIES 90 tablet 1   nitroGLYCERIN (NITROSTAT) 0.4 MG SL tablet DISSOLVE 1 TABLET UNDER THE TONGUE EVERY 5 MINUTES FOR 3 DOSES AS NEEDED FOR CHEST PAIN 25 tablet 11   pantoprazole (PROTONIX) 40 MG tablet Take 1 tablet (40 mg total) by mouth daily. 90 tablet 3   polyethylene glycol (MIRALAX / GLYCOLAX) 17 g packet Take 17 g by mouth daily. 14 each 0   potassium chloride SA (KLOR-CON M) 20 MEQ tablet TAKE 1 TABLET EVERY DAY 90 tablet 3   sacubitril-valsartan (ENTRESTO) 24-26 MG TAKE 1 TABLET TWICE DAILY 180 tablet 2   No facility-administered medications prior to visit.     Review of Systems:   Constitutional: No night sweats, fevers, chills, or lassitude. +occasional daytime fatigue, weight gain HEENT: No headaches, difficulty swallowing, tooth/dental problems, or sore throat. No sneezing, itching, ear ache, nasal congestion, or post nasal drip CV:  No chest pain, orthopnea, PND, swelling in lower extremities, anasarca, dizziness, palpitations, syncope Resp: +snoring, witnessed apneas; shortness of breath with exertion (baseline). No excess mucus or change in color of mucus. No productive or non-productive. No hemoptysis. No wheezing.  No chest wall deformity GI:  +heartburn, indigestion (baseline). No abdominal pain, nausea, vomiting, diarrhea, change in bowel habits, loss of appetite, bloody stools.  GU: No dysuria, change  in color of urine, urgency or frequency.   Skin: No rash, lesions, ulcerations MSK:  No joint pain or swelling.   Neuro: No dizziness or lightheadedness.  Psych: No depression or anxiety. Mood stable.     Physical Exam:  BP 130/70 (BP Location: Right Arm, Patient Position: Sitting, Cuff Size: Large)   Pulse 76   Temp 97.6 F (36.4 C) (Oral)   Ht 5\' 11"  (1.803 m)   Wt 269 lb (122 kg)   SpO2 100% Comment: RA  BMI 37.52 kg/m   GEN: Pleasant, interactive, well-kempt; obese; in no acute distress HEENT:  Normocephalic and atraumatic. PERRLA. Sclera white. Nasal turbinates pink, moist and patent bilaterally. No rhinorrhea present. Oropharynx pink and moist, without exudate or edema. No lesions, ulcerations, or postnasal  drip. Mallampati III/IV NECK:  Supple w/ fair ROM. No JVD present. Normal carotid impulses w/o bruits. Thyroid symmetrical with no goiter or nodules palpated. No lymphadenopathy.   CV: RRR, no m/r/g, no peripheral edema. Pulses intact, +2 bilaterally. No cyanosis, pallor or clubbing. PULMONARY:  Unlabored, regular breathing. Clear bilaterally A&P w/o wheezes/rales/rhonchi. No accessory muscle use.  GI: BS present and normoactive. Soft, non-tender to palpation. No organomegaly or masses detected. MSK: No erythema, warmth or tenderness. Cap refil <2 sec all extrem. No deformities or joint swelling noted.  Neuro: A/Ox3. No focal deficits noted.   Skin: Warm, no lesions or rashe Psych: Normal affect and behavior. Judgement and thought content appropriate.     Lab Results:  CBC    Component Value Date/Time   WBC 10.4 06/10/2022 1035   RBC 7.14 (H) 06/10/2022 1035   HGB 14.6 06/10/2022 1035   HGB 16.3 05/19/2022 0822   HCT 46.9 06/10/2022 1035   HCT 54.9 (H) 05/19/2022 0822   PLT 338 06/10/2022 1035   PLT 346 05/19/2022 0822   MCV 65.7 (L) 06/10/2022 1035   MCV 68 (L) 05/19/2022 0822   MCH 20.4 (L) 06/10/2022 1035   MCHC 31.1 06/10/2022 1035   RDW 24.3 (H)  06/10/2022 1035   RDW 23.1 (H) 05/19/2022 0822   LYMPHSABS 2.7 06/10/2022 1035   LYMPHSABS 2.9 06/14/2020 1117   MONOABS 1.3 (H) 06/10/2022 1035   EOSABS 0.2 06/10/2022 1035   EOSABS 0.3 06/14/2020 1117   BASOSABS 0.0 06/10/2022 1035   BASOSABS 0.0 06/14/2020 1117    BMET    Component Value Date/Time   NA 138 06/10/2022 1432   NA 138 12/23/2021 0818   K 3.1 (L) 06/10/2022 1432   CL 111 06/10/2022 1432   CO2 20 (L) 06/10/2022 1432   GLUCOSE 84 06/10/2022 1432   BUN 17 06/10/2022 1432   BUN 16 12/23/2021 0818   CREATININE 1.03 06/10/2022 1432   CREATININE 1.09 01/07/2015 0942   CALCIUM 8.2 (L) 06/10/2022 1432   GFRNONAA >60 06/10/2022 1432   GFRNONAA 84 12/10/2013 1548   GFRAA 79 04/01/2020 1541   GFRAA >89 12/10/2013 1548    BNP    Component Value Date/Time   BNP 50.3 07/03/2021 0052     Imaging:  No results found.        No data to display          No results found for: "NITRICOXIDE"      Assessment & Plan:   Loud snoring He has snoring, daytime sleepiness, nocturnal apneic events. BMI 37. History of MI, HFrEF, cardiomyopathy, HTN, DM. Epworth 10. Given this,  I am concerned he could have sleep disordered breathing with obstructive sleep apnea. He will need sleep study for further evaluation.    - discussed how weight can impact sleep and risk for sleep disordered breathing - discussed options to assist with weight loss: combination of diet modification, cardiovascular and strength training exercises   - had an extensive discussion regarding the adverse health consequences related to untreated sleep disordered breathing - specifically discussed the risks for hypertension, coronary artery disease, cardiac dysrhythmias, cerebrovascular disease, and diabetes - lifestyle modification discussed   - discussed how sleep disruption can increase risk of accidents, particularly when driving - safe driving practices were discussed   Daytime  hypersomnolence See above  Obesity (BMI 30-39.9) BMI 37. Healthy weight loss encouraged.  Chronic HFrEF (heart failure with reduced ejection fraction) ([name]) Euvolemic. Follow up with cardiology as scheduled.  I spent 35 minutes of dedicated to the care of this patient on the date of this encounter to include pre-visit review of records, face-to-face time with the patient discussing conditions above, post visit ordering of testing, clinical documentation with the electronic health record, making appropriate referrals as documented, and communicating necessary findings to members of the patients care team.  Noemi Chapel, NP 07/28/2022  Pt aware and understands NP's role.

## 2022-07-28 ENCOUNTER — Encounter: Payer: Self-pay | Admitting: Nurse Practitioner

## 2022-07-28 DIAGNOSIS — G4719 Other hypersomnia: Secondary | ICD-10-CM | POA: Insufficient documentation

## 2022-07-28 DIAGNOSIS — E669 Obesity, unspecified: Secondary | ICD-10-CM | POA: Insufficient documentation

## 2022-07-28 NOTE — Assessment & Plan Note (Signed)
See above

## 2022-07-28 NOTE — Assessment & Plan Note (Signed)
He has snoring, daytime sleepiness, nocturnal apneic events. BMI 37. History of MI, HFrEF, cardiomyopathy, HTN, DM. Epworth 10. Given this,  I am concerned he could have sleep disordered breathing with obstructive sleep apnea. He will need sleep study for further evaluation.    - discussed how weight can impact sleep and risk for sleep disordered breathing - discussed options to assist with weight loss: combination of diet modification, cardiovascular and strength training exercises   - had an extensive discussion regarding the adverse health consequences related to untreated sleep disordered breathing - specifically discussed the risks for hypertension, coronary artery disease, cardiac dysrhythmias, cerebrovascular disease, and diabetes - lifestyle modification discussed   - discussed how sleep disruption can increase risk of accidents, particularly when driving - safe driving practices were discussed

## 2022-07-28 NOTE — Assessment & Plan Note (Signed)
Euvolemic. Follow up with cardiology as scheduled.

## 2022-07-28 NOTE — Assessment & Plan Note (Signed)
BMI 37. Healthy weight loss encouraged.  

## 2022-08-01 NOTE — Progress Notes (Signed)
Remote ICD transmission.   

## 2022-08-27 ENCOUNTER — Other Ambulatory Visit: Payer: Self-pay | Admitting: Cardiovascular Disease

## 2022-08-27 DIAGNOSIS — E7800 Pure hypercholesterolemia, unspecified: Secondary | ICD-10-CM

## 2022-08-29 NOTE — Telephone Encounter (Signed)
Refill request

## 2022-08-30 DIAGNOSIS — G4730 Sleep apnea, unspecified: Secondary | ICD-10-CM | POA: Diagnosis not present

## 2022-09-06 ENCOUNTER — Ambulatory Visit: Payer: Medicare HMO | Admitting: Nurse Practitioner

## 2022-09-13 ENCOUNTER — Encounter (INDEPENDENT_AMBULATORY_CARE_PROVIDER_SITE_OTHER): Payer: Medicare HMO

## 2022-09-13 DIAGNOSIS — R0683 Snoring: Secondary | ICD-10-CM

## 2022-09-13 DIAGNOSIS — G4733 Obstructive sleep apnea (adult) (pediatric): Secondary | ICD-10-CM

## 2022-09-13 DIAGNOSIS — G4719 Other hypersomnia: Secondary | ICD-10-CM

## 2022-09-18 ENCOUNTER — Ambulatory Visit (INDEPENDENT_AMBULATORY_CARE_PROVIDER_SITE_OTHER): Payer: Medicare HMO

## 2022-09-18 DIAGNOSIS — I4901 Ventricular fibrillation: Secondary | ICD-10-CM

## 2022-09-22 LAB — CUP PACEART REMOTE DEVICE CHECK
Battery Remaining Longevity: 112 mo
Battery Remaining Percentage: 89 %
Battery Voltage: 3.01 V
Brady Statistic RV Percent Paced: 1 %
Date Time Interrogation Session: 20240621133308
HighPow Impedance: 57 Ohm
Implantable Lead Connection Status: 753985
Implantable Lead Implant Date: 20101111
Implantable Lead Location: 753860
Implantable Lead Model: 7121
Implantable Pulse Generator Implant Date: 20230317
Lead Channel Impedance Value: 500 Ohm
Lead Channel Pacing Threshold Amplitude: 1 V
Lead Channel Pacing Threshold Pulse Width: 0.5 ms
Lead Channel Sensing Intrinsic Amplitude: 12 mV
Lead Channel Setting Pacing Amplitude: 2.5 V
Lead Channel Setting Pacing Pulse Width: 0.5 ms
Lead Channel Setting Sensing Sensitivity: 0.5 mV
Pulse Gen Serial Number: 210001302
Zone Setting Status: 755011

## 2022-09-28 ENCOUNTER — Inpatient Hospital Stay: Payer: Medicare HMO | Attending: Nurse Practitioner

## 2022-09-28 DIAGNOSIS — Z87891 Personal history of nicotine dependence: Secondary | ICD-10-CM | POA: Insufficient documentation

## 2022-09-28 DIAGNOSIS — D751 Secondary polycythemia: Secondary | ICD-10-CM | POA: Insufficient documentation

## 2022-09-28 DIAGNOSIS — E611 Iron deficiency: Secondary | ICD-10-CM | POA: Diagnosis not present

## 2022-09-28 LAB — CBC WITH DIFFERENTIAL (CANCER CENTER ONLY)
Abs Immature Granulocytes: 0.02 10*3/uL (ref 0.00–0.07)
Basophils Absolute: 0.1 10*3/uL (ref 0.0–0.1)
Basophils Relative: 1 %
Eosinophils Absolute: 0.2 10*3/uL (ref 0.0–0.5)
Eosinophils Relative: 2 %
HCT: 49.5 % (ref 39.0–52.0)
Hemoglobin: 15.8 g/dL (ref 13.0–17.0)
Immature Granulocytes: 0 %
Lymphocytes Relative: 26 %
Lymphs Abs: 2.5 10*3/uL (ref 0.7–4.0)
MCH: 21.9 pg — ABNORMAL LOW (ref 26.0–34.0)
MCHC: 31.9 g/dL (ref 30.0–36.0)
MCV: 68.8 fL — ABNORMAL LOW (ref 80.0–100.0)
Monocytes Absolute: 0.9 10*3/uL (ref 0.1–1.0)
Monocytes Relative: 10 %
Neutro Abs: 6 10*3/uL (ref 1.7–7.7)
Neutrophils Relative %: 61 %
Platelet Count: 288 10*3/uL (ref 150–400)
RBC: 7.2 MIL/uL — ABNORMAL HIGH (ref 4.22–5.81)
RDW: 22.1 % — ABNORMAL HIGH (ref 11.5–15.5)
Smear Review: NORMAL
WBC Count: 9.7 10*3/uL (ref 4.0–10.5)
nRBC: 0 % (ref 0.0–0.2)

## 2022-09-28 LAB — IRON AND IRON BINDING CAPACITY (CC-WL,HP ONLY)
Iron: 62 ug/dL (ref 45–182)
Saturation Ratios: 13 % — ABNORMAL LOW (ref 17.9–39.5)
TIBC: 494 ug/dL — ABNORMAL HIGH (ref 250–450)
UIBC: 432 ug/dL — ABNORMAL HIGH (ref 117–376)

## 2022-09-28 LAB — FERRITIN: Ferritin: 18 ng/mL — ABNORMAL LOW (ref 24–336)

## 2022-10-06 ENCOUNTER — Encounter: Payer: Self-pay | Admitting: Family Medicine

## 2022-10-06 ENCOUNTER — Ambulatory Visit (INDEPENDENT_AMBULATORY_CARE_PROVIDER_SITE_OTHER): Payer: Medicare HMO | Admitting: Family Medicine

## 2022-10-06 VITALS — BP 116/74 | HR 88 | Temp 96.4°F | Ht 71.0 in | Wt 275.8 lb

## 2022-10-06 DIAGNOSIS — J302 Other seasonal allergic rhinitis: Secondary | ICD-10-CM

## 2022-10-06 DIAGNOSIS — R7303 Prediabetes: Secondary | ICD-10-CM

## 2022-10-06 DIAGNOSIS — Z1211 Encounter for screening for malignant neoplasm of colon: Secondary | ICD-10-CM | POA: Diagnosis not present

## 2022-10-06 DIAGNOSIS — R0683 Snoring: Secondary | ICD-10-CM

## 2022-10-06 DIAGNOSIS — E611 Iron deficiency: Secondary | ICD-10-CM

## 2022-10-06 LAB — BASIC METABOLIC PANEL
BUN: 14 mg/dL (ref 6–23)
CO2: 25 mEq/L (ref 19–32)
Calcium: 9.4 mg/dL (ref 8.4–10.5)
Chloride: 104 mEq/L (ref 96–112)
Creatinine, Ser: 1.23 mg/dL (ref 0.40–1.50)
GFR: 65.69 mL/min (ref >60.00)
Glucose, Bld: 108 mg/dL — ABNORMAL HIGH (ref 70–99)
Potassium: 4.1 mEq/L (ref 3.5–5.1)
Sodium: 137 mEq/L (ref 135–145)

## 2022-10-06 LAB — MICROALBUMIN / CREATININE URINE RATIO
Creatinine,U: 53.8 mg/dL
Microalb Creat Ratio: 1.3 mg/g (ref 0.0–30.0)
Microalb, Ur: <0.7 mg/dL (ref 0.0–1.9)

## 2022-10-06 LAB — CBC
HCT: 50.7 % (ref 39.0–52.0)
Hemoglobin: 15.5 g/dL (ref 13.0–17.0)
MCHC: 30.5 g/dL (ref 30.0–36.0)
MCV: 71.4 fl — ABNORMAL LOW (ref 78.0–100.0)
Platelets: 285 10*3/uL (ref 150.0–400.0)
RBC: 7.1 Mil/uL — ABNORMAL HIGH (ref 4.22–5.81)
RDW: 21.5 % — ABNORMAL HIGH (ref 11.5–15.5)
WBC: 9.9 10*3/uL (ref 4.0–10.5)

## 2022-10-06 LAB — HEMOGLOBIN A1C: Hgb A1c MFr Bld: 6.4 % (ref 4.6–6.5)

## 2022-10-06 NOTE — Progress Notes (Addendum)
Established Patient Office Visit   Subjective:  Patient ID: Billy [name], male    DOB: [birth year]  Age: 57 y.o. MRN: [id number]  Chief Complaint  Patient presents with   Medical Management of Chronic Issues    3 month follow up. Pt is not fasting. Pt concern with effectiveness of his allergy medication. Having dry itchy eyes in the morning.     HPI Encounter Diagnoses  Name Primary?   Prediabetes Yes   Seasonal allergies    Colon cancer screening    Snores    Iron deficiency    For follow-up of above.  Continue empagliflozin per cardiology for prediabetes.  Flonase is not seeing to help his allergies much.  Continues to deal with itchy watery eyes sneezing and congestion.  Admits that he is using it as needed at this point.  Status post home sleep study.  He is awaiting results.  He is accompanied by his wife today.   Review of Systems  Constitutional: Negative.   HENT: Negative.    Eyes:  Negative for blurred vision, discharge and redness.  Respiratory: Negative.    Cardiovascular: Negative.   Gastrointestinal:  Negative for abdominal pain.  Genitourinary: Negative.   Musculoskeletal: Negative.  Negative for myalgias.  Skin:  Negative for rash.  Neurological:  Negative for tingling, loss of consciousness and weakness.  Endo/Heme/Allergies:  Negative for polydipsia.     Current Outpatient Medications:    alum & mag hydroxide-simeth (MAALOX MAX) 400-400-40 MG/5ML suspension, Take 10 mLs by mouth every 6 (six) hours as needed for indigestion., Disp: 355 mL, Rfl: 0   apixaban (ELIQUIS) 5 MG TABS tablet, TAKE 1 TABLET TWICE DAILY, Disp: 180 tablet, Rfl: 3   atorvastatin (LIPITOR) 80 MG tablet, TAKE 1 TABLET EVERY DAY, Disp: 90 tablet, Rfl: 2   carvedilol (COREG) 3.125 MG tablet, TAKE 1 TABLET BY MOUTH TWICE A DAY WITH A MEAL, Disp: 180 tablet, Rfl: 3   clopidogrel (PLAVIX) 75 MG tablet, Take 1 tablet (75 mg total) by mouth daily., Disp: 90 tablet, Rfl: 3   empagliflozin  (JARDIANCE) 10 MG TABS tablet, Take 1 tablet (10 mg total) by mouth daily., Disp: 90 tablet, Rfl: 3   ezetimibe (ZETIA) 10 MG tablet, TAKE 1 TABLET EVERY DAY, Disp: 90 tablet, Rfl: 3   fluticasone (FLONASE) 50 MCG/ACT nasal spray, Place 2 sprays into both nostrils daily as needed for allergies or rhinitis., Disp: 16 g, Rfl: 11   furosemide (LASIX) 40 MG tablet, TAKE 1 TABLET EVERY DAY, Disp: 90 tablet, Rfl: 3   Iron, Ferrous Sulfate, 325 (65 Fe) MG TABS, Take 325 mg by mouth daily., Disp: 270 tablet, Rfl: 1   isosorbide mononitrate (IMDUR) 30 MG 24 hr tablet, Take 0.5 tablets (15 mg total) by mouth daily., Disp: 90 tablet, Rfl: 3   levothyroxine (SYNTHROID) 50 MCG tablet, TAKE 1 TABLET BY MOUTH DAILY BEFORE BREAKFAST. 30 MINUTES BEFORE HAVING BREAKFAST., Disp: 90 tablet, Rfl: 0   montelukast (SINGULAIR) 10 MG tablet, TAKE 1 TABLET EVERY DAY AS NEEDED FOR ALLERGIES, Disp: 90 tablet, Rfl: 1   nitroGLYCERIN (NITROSTAT) 0.4 MG SL tablet, DISSOLVE 1 TABLET UNDER THE TONGUE EVERY 5 MINUTES FOR 3 DOSES AS NEEDED FOR CHEST PAIN, Disp: 25 tablet, Rfl: 11   pantoprazole (PROTONIX) 40 MG tablet, Take 1 tablet (40 mg total) by mouth daily., Disp: 90 tablet, Rfl: 3   polyethylene glycol (MIRALAX / GLYCOLAX) 17 g packet, Take 17 g by mouth daily., Disp: 14 each, Rfl: 0  potassium chloride SA (KLOR-CON M) 20 MEQ tablet, TAKE 1 TABLET EVERY DAY, Disp: 90 tablet, Rfl: 3   sacubitril-valsartan (ENTRESTO) 24-26 MG, TAKE 1 TABLET TWICE DAILY, Disp: 180 tablet, Rfl: 2   Objective:     BP 116/74   Pulse 88   Temp (!) 96.4 F (35.8 C)   Ht 5\' 11"  (1.803 m)   Wt 275 lb 12.8 oz (125.1 kg)   SpO2 96%   BMI 38.47 kg/m    Physical Exam Constitutional:      General: He is not in acute distress.    Appearance: Normal appearance. He is not ill-appearing, toxic-appearing or diaphoretic.  HENT:     Head: Normocephalic and atraumatic.     Right Ear: External ear normal.     Left Ear: External ear normal.      Mouth/Throat:     Mouth: Mucous membranes are moist.     Pharynx: Oropharynx is clear. No oropharyngeal exudate or posterior oropharyngeal erythema.  Eyes:     General: No scleral icterus.       Right eye: No discharge.        Left eye: No discharge.     Extraocular Movements: Extraocular movements intact.     Conjunctiva/sclera: Conjunctivae normal.     Pupils: Pupils are equal, round, and reactive to light.  Cardiovascular:     Rate and Rhythm: Normal rate and regular rhythm.  Pulmonary:     Effort: Pulmonary effort is normal. No respiratory distress.     Breath sounds: Normal breath sounds. No wheezing, rhonchi or rales.  Abdominal:     General: Bowel sounds are normal.  Musculoskeletal:     Cervical back: No rigidity or tenderness.  Skin:    General: Skin is warm and dry.  Neurological:     Mental Status: He is alert and oriented to person, place, and time.  Psychiatric:        Mood and Affect: Mood normal.        Behavior: Behavior normal.     Diabetic Foot Exam - Simple   Simple Foot Form Diabetic Foot exam was performed with the following findings: Yes 10/06/2022 11:25 AM  Visual Inspection See comments: Yes Sensation Testing Intact to touch and monofilament testing bilaterally: Yes Pulse Check See comments: Yes Comments Feet are pes planus bilaterally.  PT and DP pulses were diminished with capillary refill in the great toes with brisk.     Results for orders placed or performed in visit on 10/06/22  Basic metabolic panel  Result Value Ref Range   Sodium 137 135 - 145 mEq/L   Potassium 4.1 3.5 - 5.1 mEq/L   Chloride 104 96 - 112 mEq/L   CO2 25 19 - 32 mEq/L   Glucose, Bld 108 (H) 70 - 99 mg/dL   BUN 14 6 - 23 mg/dL   Creatinine, Ser 1.61 0.40 - 1.50 mg/dL   GFR 09.60 >45.40 mL/min   Calcium 9.4 8.4 - 10.5 mg/dL  CBC  Result Value Ref Range   WBC 9.9 4.0 - 10.5 K/uL   RBC 7.10 (H) 4.22 - 5.81 Mil/uL   Platelets 285.0 150.0 - 400.0 K/uL   Hemoglobin 15.5  13.0 - 17.0 g/dL   HCT 98.1 19.1 - 47.8 %   MCV 71.4 (L) 78.0 - 100.0 fl   MCHC 30.5 30.0 - 36.0 g/dL   RDW 29.5 (H) 62.1 - 30.8 %  Iron, TIBC and Ferritin Panel  Result Value Ref Range  Iron 46 (L) 50 - 180 mcg/dL   TIBC 629 528 - 413 mcg/dL (calc)   %SAT 11 (L) 20 - 48 % (calc)   Ferritin 17 (L) 38 - 380 ng/mL  Hemoglobin A1c  Result Value Ref Range   Hgb A1c MFr Bld 6.4 4.6 - 6.5 %  Microalbumin / creatinine urine ratio  Result Value Ref Range   Microalb, Ur <0.7 0.0 - 1.9 mg/dL   Creatinine,U 24.4 mg/dL   Microalb Creat Ratio 1.3 0.0 - 30.0 mg/g      The ASCVD Risk score (Arnett DK, et al., 2019) failed to calculate for the following reasons:   The patient has a prior MI or stroke diagnosis    Assessment & Plan:   Prediabetes -     Basic metabolic panel -     Hemoglobin A1c -     Microalbumin / creatinine urine ratio -     Ambulatory referral to Ophthalmology  Seasonal allergies  Colon cancer screening -     Ambulatory referral to Gastroenterology  Snores  Iron deficiency -     CBC -     Iron, TIBC and Ferritin Panel -     Iron (Ferrous Sulfate); Take 325 mg by mouth daily.  Dispense: 270 tablet; Refill: 1    Return in about 6 months (around 04/08/2023).  Will be certain to use the Flonase daily.  Referral for colonoscopy.  Rechecking iron and ferritin levels.  Rechecking A1c.  Adjustments made pending results of lab work  Mliss Sax, MD

## 2022-10-07 LAB — IRON,TIBC AND FERRITIN PANEL
%SAT: 11 % (calc) — ABNORMAL LOW (ref 20–48)
Ferritin: 17 ng/mL — ABNORMAL LOW (ref 38–380)
Iron: 46 ug/dL — ABNORMAL LOW (ref 50–180)
TIBC: 424 mcg/dL (calc) (ref 250–425)

## 2022-10-09 NOTE — Progress Notes (Signed)
Remote ICD transmission.   

## 2022-10-11 ENCOUNTER — Ambulatory Visit: Payer: Medicare HMO | Admitting: Family Medicine

## 2022-10-17 ENCOUNTER — Ambulatory Visit: Payer: Medicare HMO | Admitting: Nurse Practitioner

## 2022-10-17 MED ORDER — IRON (FERROUS SULFATE) 325 (65 FE) MG PO TABS
325.0000 mg | ORAL_TABLET | Freq: Every day | ORAL | 1 refills | Status: DC
Start: 2022-10-17 — End: 2023-06-21

## 2022-10-17 NOTE — Addendum Note (Signed)
Addended by: Andrez Grime on: 10/17/2022 10:19 AM   Modules accepted: Orders

## 2022-11-08 DIAGNOSIS — H524 Presbyopia: Secondary | ICD-10-CM | POA: Diagnosis not present

## 2022-11-08 DIAGNOSIS — H04123 Dry eye syndrome of bilateral lacrimal glands: Secondary | ICD-10-CM | POA: Diagnosis not present

## 2022-11-08 DIAGNOSIS — R7309 Other abnormal glucose: Secondary | ICD-10-CM | POA: Diagnosis not present

## 2022-11-18 ENCOUNTER — Other Ambulatory Visit: Payer: Self-pay | Admitting: Cardiology

## 2022-11-20 ENCOUNTER — Telehealth: Payer: Self-pay | Admitting: Cardiology

## 2022-11-20 MED ORDER — POTASSIUM CHLORIDE CRYS ER 20 MEQ PO TBCR: 20.0000 meq | EXTENDED_RELEASE_TABLET | Freq: Every day | ORAL | 2 refills | Status: DC

## 2022-11-20 NOTE — Telephone Encounter (Signed)
*  STAT* If patient is at the pharmacy, call can be transferred to refill team.   1. Which medications need to be refilled? (please list name of each medication and dose if known)   potassium chloride SA (KLOR-CON M) 20 MEQ tablet    2. Which pharmacy/location (including street and city if local pharmacy) is medication to be sent to?  [id number] - [place], [place] - [address]      3. Do they need a 30 day or 90 day supply? 90 day  Pt is completely out of medication

## 2022-11-20 NOTE — Telephone Encounter (Signed)
Pt's medication was sent to pt's pharmacy as requested. Confirmation received.  °

## 2022-12-07 ENCOUNTER — Other Ambulatory Visit: Payer: Self-pay | Admitting: Cardiology

## 2022-12-07 DIAGNOSIS — I480 Paroxysmal atrial fibrillation: Secondary | ICD-10-CM

## 2022-12-07 NOTE — Telephone Encounter (Signed)
Prescription refill request for Eliquis received. Indication: PAF Last office visit: 06/15/22  Velta Addison MD Scr: 1.23 on 10/06/22  Epic Age: 57 Weight: 120.8kg  Based on above findings Eliquis 5mg  twice daily is the appropriate dose.  Refill approved.

## 2022-12-11 NOTE — Progress Notes (Deleted)
HPI:FU CAD. Patient suffered a cardiac arrest in November of 2010. Echocardiogram initially revealed an EF of 10-15% however followup echo showed an EF of 40-45. Given out of the hospital VF arrest a St. Jude single lead ICD was placed. Also had episode of atrial fibrillation November 2020 and dyspnea with Brilinta. Cardiac catheterization February 2022 showed old occlusion of the first diagonal, old occlusion of the mid circumflex but with good collateralization to the distal vessel, mid total occlusion of the right coronary artery with collateralization to the distal PDA and PLA, atretic LIMA to the LAD, patent SVG to the diagonal and occlusion of the saphenous vein graft to the OM 2 and of the SVG to distal right coronary artery.  Medical therapy recommended.   Last echocardiogram March 2023 showed ejection fraction 40 to 45%, mild left ventricular hypertrophy, grade 1 diastolic dysfunction.  Patient also with peripheral vascular disease followed by Dr. Kirke Corin; he has had previous stent to the right external iliac artery.  Also with previous ablation of atrial tachycardia.  ABIs February 2024 showed no significant stenosis and patent right external iliac artery stent.  ABIs February 2024 moderate on the right and left.  Since I last saw him   Current Outpatient Medications  Medication Sig Dispense Refill   alum & mag hydroxide-simeth (MAALOX MAX) 400-400-40 MG/5ML suspension Take 10 mLs by mouth every 6 (six) hours as needed for indigestion. 355 mL 0   apixaban (ELIQUIS) 5 MG TABS tablet TAKE 1 TABLET TWICE DAILY 180 tablet 1   atorvastatin (LIPITOR) 80 MG tablet TAKE 1 TABLET EVERY DAY 90 tablet 2   carvedilol (COREG) 3.125 MG tablet TAKE 1 TABLET BY MOUTH TWICE A DAY WITH A MEAL 180 tablet 3   clopidogrel (PLAVIX) 75 MG tablet Take 1 tablet (75 mg total) by mouth daily. 90 tablet 3   empagliflozin (JARDIANCE) 10 MG TABS tablet Take 1 tablet (10 mg total) by mouth daily. 90 tablet 3   ezetimibe  (ZETIA) 10 MG tablet TAKE 1 TABLET EVERY DAY 90 tablet 3   fluticasone (FLONASE) 50 MCG/ACT nasal spray Place 2 sprays into both nostrils daily as needed for allergies or rhinitis. 16 g 11   furosemide (LASIX) 40 MG tablet TAKE 1 TABLET EVERY DAY 90 tablet 3   Iron, Ferrous Sulfate, 325 (65 Fe) MG TABS Take 325 mg by mouth daily. 270 tablet 1   isosorbide mononitrate (IMDUR) 30 MG 24 hr tablet Take 0.5 tablets (15 mg total) by mouth daily. 90 tablet 3   levothyroxine (SYNTHROID) 50 MCG tablet TAKE 1 TABLET BY MOUTH DAILY BEFORE BREAKFAST. 30 MINUTES BEFORE HAVING BREAKFAST. 90 tablet 0   montelukast (SINGULAIR) 10 MG tablet TAKE 1 TABLET EVERY DAY AS NEEDED FOR ALLERGIES 90 tablet 1   nitroGLYCERIN (NITROSTAT) 0.4 MG SL tablet DISSOLVE 1 TABLET UNDER THE TONGUE EVERY 5 MINUTES FOR 3 DOSES AS NEEDED FOR CHEST PAIN 25 tablet 11   pantoprazole (PROTONIX) 40 MG tablet Take 1 tablet (40 mg total) by mouth daily. 90 tablet 3   polyethylene glycol (MIRALAX / GLYCOLAX) 17 g packet Take 17 g by mouth daily. 14 each 0   potassium chloride SA (KLOR-CON M) 20 MEQ tablet Take 1 tablet (20 mEq total) by mouth daily. 90 tablet 2   sacubitril-valsartan (ENTRESTO) 24-26 MG TAKE 1 TABLET TWICE DAILY 180 tablet 2   No current facility-administered medications for this visit.     Past Medical History:  Diagnosis Date  Aortic atherosclerosis ([name])    on CXR   Blood in stool    C. difficile colitis    a. remote hx 2010.   Cardiac arrest - ventricular fibrillation 02/11/2009   a. 02/2009 s/p St Jude ICD.   Chronic combined systolic and diastolic CHF (congestive heart failure) ([name])    Chronic kidney disease, stage 3a ([name])    Coronary artery disease    a. PCI to Cx age 10. b. CABGx4 in 2003. c. BMS to OM1 in 12/2007. d. PTCA to Cx 01/2019.   Former tobacco use    Hyperlipidemia    Hypertension    Ischemic cardiomyopathy    Marijuana abuse    Myocardial infarct ([name])    NON-ST-SEGMENT ELEVATION MI    Obesity    PAD (peripheral artery disease) ([name])    PAF (paroxysmal atrial fibrillation) ([name])    Pathologic fracture of left acetabulum 07/03/2021   PSVT (paroxysmal supraventricular tachycardia)    Ventricular tachycardia ([name])     Past Surgical History:  Procedure Laterality Date   ABDOMINAL AORTOGRAM W/LOWER EXTREMITY N/A 05/14/2019   Procedure: ABDOMINAL AORTOGRAM W/LOWER EXTREMITY;  Surgeon: Iran Ouch, MD;  Location: [facility] INVASIVE CV LAB;  Service: Cardiovascular;  Laterality: N/A;   CARDIAC DEFIBRILLATOR PLACEMENT     STJ single chamber ICD implanted for secondary prevention   CIRCUMCISION     CORONARY ARTERY BYPASS GRAFT  06/17/01   X 4   CORONARY BALLOON ANGIOPLASTY N/A 01/22/2019   Procedure: CORONARY BALLOON ANGIOPLASTY;  Surgeon: Marykay Lex, MD;  Location: [facility] INVASIVE CV LAB;  Service: Cardiovascular;  Laterality: N/A;   HIP CLOSED REDUCTION Left 07/02/2021   Procedure: CLOSED REDUCTION HIP with placement of skeletal traction;  Surgeon: Jones Broom, MD;  Location: [facility] OR;  Service: Orthopedics;  Laterality: Left;   ICD GENERATOR CHANGEOUT N/A 06/17/2021   Procedure: ICD GENERATOR CHANGEOUT;  Surgeon: Duke Salvia, MD;  Location: [facility] INVASIVE CV LAB;  Service: Cardiovascular;  Laterality: N/A;   LEFT HEART CATH AND CORS/GRAFTS ANGIOGRAPHY N/A 01/22/2019   Procedure: LEFT HEART CATH AND CORS/GRAFTS ANGIOGRAPHY;  Surgeon: Marykay Lex, MD;  Location: [facility] INVASIVE CV LAB;  Service: Cardiovascular;  Laterality: N/A;   LEFT HEART CATH AND CORS/GRAFTS ANGIOGRAPHY N/A 05/18/2020   Procedure: LEFT HEART CATH AND CORS/GRAFTS ANGIOGRAPHY;  Surgeon: Lennette Bihari, MD;  Location: [facility] INVASIVE CV LAB;  Service: Cardiovascular;  Laterality: N/A;   OPEN REDUCTION INTERNAL FIXATION ACETABULUM FRACTURE POSTERIOR Left 07/04/2021   Procedure: OPEN REDUCTION INTERNAL FIXATION ACETABULUM FRACTURE POSTERIOR;  Surgeon: Myrene Galas, MD;  Location: [facility] OR;  Service: Orthopedics;   Laterality: Left;   PERIPHERAL VASCULAR INTERVENTION Right 05/14/2019   Procedure: PERIPHERAL VASCULAR INTERVENTION;  Surgeon: Iran Ouch, MD;  Location: [facility] INVASIVE CV LAB;  Service: Cardiovascular;  Laterality: Right;   SVT ABLATION N/A 06/21/2020   Procedure: SVT ABLATION;  Surgeon: Marinus Maw, MD;  Location: [facility] INVASIVE CV LAB;  Service: Cardiovascular;  Laterality: N/A;    Social History   Socioeconomic History   Marital status: Married    Spouse name: Not on file   Number of children: 3   Years of education: 11   Highest education level: 11th grade  Occupational History   Occupation: Disability  Tobacco Use   Smoking status: Former    Current packs/day: 0.00    Average packs/day: 2.0 packs/day for 20.0 years (40.0 ttl pk-yrs)    Types: Cigarettes    Start date: 2001  Quit date: 2021    Years since quitting: 3.6    Passive exposure: Never   Smokeless tobacco: Never  Vaping Use   Vaping status: Never Used  Substance and Sexual Activity   Alcohol use: No   Drug use: No   Sexual activity: Yes    Partners: Female  Other Topics Concern   Not on file  Social History Narrative   MARRIED   FORMER TOBACCO USE, SMOKED FOR 20 YRS 2 PPD quit 2021   NO ETOH   NO ILLICIT DRUG USE   NO REGULAR EXERCISE         ICD-ST. JUDE ; CERTIFIED LETTER SENT AND RETURNED BAD ADDRESS, PLS UPDATE DJW.      Fun: Fishing    Social Determinants of Health   Financial Resource Strain: Medium Risk (07/11/2022)   Overall Financial Resource Strain (CARDIA)    Difficulty of Paying Living Expenses: Somewhat hard  Food Insecurity: No Food Insecurity (07/11/2022)   Hunger Vital Sign    Worried About Running Out of Food in the Last Year: Never true    Ran Out of Food in the Last Year: Never true  Transportation Needs: No Transportation Needs (07/11/2022)   PRAPARE - Administrator, Civil Service (Medical): No    Lack of Transportation (Non-Medical): No  Physical Activity:  Sufficiently Active (04/24/2022)   Exercise Vital Sign    Days of Exercise per Week: 6 days    Minutes of Exercise per Session: 30 min  Stress: No Stress Concern Present (07/11/2022)   [employer] of Occupational Health - Occupational Stress Questionnaire    Feeling of Stress : Not at all  Social Connections: Moderately Isolated (07/11/2022)   Social Connection and Isolation Panel [NHANES]    Frequency of Communication with Friends and Family: Once a week    Frequency of Social Gatherings with Friends and Family: Once a week    Attends Religious Services: 1 to 4 times per year    Active Member of [employer] or Organizations: No    Attends Banker Meetings: Never    Marital Status: Married  Catering manager Violence: Not At Risk (04/24/2022)   Humiliation, Afraid, Rape, and Kick questionnaire    Fear of Current or Ex-Partner: No    Emotionally Abused: No    Physically Abused: No    Sexually Abused: No    Family History  Problem Relation Age of Onset   Hypertension Mother    Heart attack Father        DIED AT 18 FROM MI   Heart disease Sister        CAD AND PREVIOUS CABG   Cancer Paternal Uncle        throat   Cancer Paternal Uncle        leukemia   Hypertension Other        IN MOST OF HIS SIBLINGS    ROS: no fevers or chills, productive cough, hemoptysis, dysphasia, odynophagia, melena, hematochezia, dysuria, hematuria, rash, seizure activity, orthopnea, PND, pedal edema, claudication. Remaining systems are negative.  Physical Exam: Well-developed well-nourished in no acute distress.  Skin is warm and dry.  HEENT is normal.  Neck is supple.  Chest is clear to auscultation with normal expansion.  Cardiovascular exam is regular rate and rhythm.  Abdominal exam nontender or distended. No masses palpated. Extremities show no edema. neuro grossly intact  ECG- personally reviewed  A/P  1 coronary artery disease-patient doing well with no chest  pain.  Continue  statin.  No aspirin given need for apixaban.  2 hyperlipidemia-continue statin.  3 hypertension-blood pressure controlled.  Continue present medical regimen.  4 ischemic cardiomyopathy-continue Entresto, carvedilol and Jardiance.  Add spironolactone 12.5 mg daily.  Check potassium and renal function in 1 week.  5 paroxysmal atrial fibrillation-continue apixaban and beta-blocker.  6 ICD-Per EP.  7 history of ventricular tachycardia-continue amiodarone at present dose.  8 history of peripheral vascular disease-continue statin.  Patient is followed by Dr. Kirke Corin.  9 SVT-history of ablation.  Olga Millers, MD

## 2022-12-18 ENCOUNTER — Ambulatory Visit: Payer: Medicare HMO | Admitting: Cardiology

## 2022-12-18 ENCOUNTER — Ambulatory Visit: Payer: Medicare HMO

## 2022-12-18 DIAGNOSIS — I4901 Ventricular fibrillation: Secondary | ICD-10-CM

## 2022-12-19 LAB — CUP PACEART REMOTE DEVICE CHECK
Battery Remaining Longevity: 109 mo
Battery Remaining Percentage: 87 %
Battery Voltage: 3.01 V
Brady Statistic RV Percent Paced: 1 %
Date Time Interrogation Session: 20240916082326
HighPow Impedance: 61 Ohm
Implantable Lead Connection Status: 753985
Implantable Lead Implant Date: 20101111
Implantable Lead Location: 753860
Implantable Lead Model: 7121
Implantable Pulse Generator Implant Date: 20230317
Lead Channel Impedance Value: 550 Ohm
Lead Channel Pacing Threshold Amplitude: 1 V
Lead Channel Pacing Threshold Pulse Width: 0.5 ms
Lead Channel Sensing Intrinsic Amplitude: 12 mV
Lead Channel Setting Pacing Amplitude: 2.5 V
Lead Channel Setting Pacing Pulse Width: 0.5 ms
Lead Channel Setting Sensing Sensitivity: 0.5 mV
Pulse Gen Serial Number: 210001302
Zone Setting Status: 755011

## 2022-12-20 ENCOUNTER — Encounter: Payer: Self-pay | Admitting: Gastroenterology

## 2022-12-20 ENCOUNTER — Ambulatory Visit (INDEPENDENT_AMBULATORY_CARE_PROVIDER_SITE_OTHER): Payer: Medicare HMO | Admitting: Gastroenterology

## 2022-12-20 ENCOUNTER — Telehealth: Payer: Self-pay

## 2022-12-20 VITALS — BP 129/80 | HR 83 | Ht 71.0 in | Wt 274.0 lb

## 2022-12-20 DIAGNOSIS — Z7901 Long term (current) use of anticoagulants: Secondary | ICD-10-CM

## 2022-12-20 DIAGNOSIS — R195 Other fecal abnormalities: Secondary | ICD-10-CM | POA: Diagnosis not present

## 2022-12-20 MED ORDER — NA SULFATE-K SULFATE-MG SULF 17.5-3.13-1.6 GM/177ML PO SOLN: 1.0000 | Freq: Once | ORAL | 0 refills | Status: AC

## 2022-12-20 MED ORDER — FAMOTIDINE 40 MG PO TABS: 40.0000 mg | ORAL_TABLET | Freq: Every day | ORAL | 11 refills | Status: AC

## 2022-12-20 NOTE — Telephone Encounter (Signed)
Patient with diagnosis of afib on Eliquis for anticoagulation.    Procedure: colonoscopy Date of procedure: 01/11/23   CHA2DS2-VASc Score = 3   This indicates a 3.2% annual risk of stroke. The patient's score is based upon: CHF History: 1 HTN History: 1 Diabetes History: 0 Stroke History: 0 Vascular Disease History: 1 Age Score: 0 Gender Score: 0      CrCl 90 ml/min Platelet count 285  Per office protocol, patient can hold Eliquis for 2 days prior to procedure.    **This guidance is not considered finalized until pre-operative APP has relayed final recommendations.**

## 2022-12-20 NOTE — Telephone Encounter (Signed)
[facility] Pre-operative Risk Assessment     Request for surgical clearance:     Endoscopy Procedure  What type of surgery is being performed?     Colonoscopy at [facility]  When is this surgery scheduled?     01/11/23  What type of clearance is required ?   Pharmacy  Are there any medications that need to be held prior to surgery and how long? Plavix x 5 days and Eliquis x 2 days  Practice name and name of physician performing surgery?      [facility]  What is your office phone and fax number?      Phone- [phone]  Fax- [phone]  Anesthesia type (None, local, MAC, general) ?       MAC

## 2022-12-20 NOTE — Progress Notes (Signed)
Assessment     Cologuard positive in Jan 2023.  R/O colorectal neoplasms.    2.  History of CAD, S/P CABG remotely, S/P PCI, history of VF arrest in 2010, history of SVT, ischemic cardiomyopathy, chronic systolic heart failure, S/P ICD, history of VT previously on amiodarone.  He is maintained on Plavix and Eliquis.   3.  GERD, occasional breakthrough symptoms   4.  CKD3  Recommendations    Schedule colonoscopy at hospital on Oct. 10.  He is higher risk.  The risks (including bleeding, perforation, infection, missed lesions, medication reactions and possible hospitalization or surgery if complications occur), benefits, and alternatives to colonoscopy with possible biopsy and possible polypectomy were discussed with the patient and they consent to proceed.   Hold Plavix 5 days and Eliquis 2 days before procedure - will instruct when and how to resume after procedure. Low but real risk of cardiovascular event such as heart attack, stroke, embolism, thrombosis or ischemia/infarct of other organs off Plavix and Eliquis explained and need to seek urgent help if this occurs. The patient consents to proceed. Will communicate by phone or EMR with patient's prescribing provider to confirm that holding Plavix and Elqiuis is reasonable in this case.   Add famotidine 40 mg po every day as needed. Follow antireflux measures.   HPI    This is a 57 year old male returning for positive Cologuard.  Cologuard was obtained in January 2023. Colonoscopy was recommended after ICD battery replacement and cardiology procedure risk stratification.  He was hospitalized with a left acetabular fracture, S/P ORIF in April 2023.  He returns today accompanied by his wife.  He relates occasional reflux breakthrough symptoms about every 2 to 3 weeks and takes an extra pantoprazole.  No other gastrointestinal complaints.  Denies weight loss, abdominal pain, constipation, diarrhea, change in stool caliber, melena,  hematochezia, nausea, vomiting, dysphagia, chest pain.    Labs / Imaging       Latest Ref Rng & Units 06/10/2022    2:32 PM 11/29/2020   10:18 AM 09/06/2020    9:52 AM  Hepatic Function  Total Protein 6.5 - 8.1 g/dL 6.8  7.7  7.6   Albumin 3.5 - 5.0 g/dL 3.5  4.4  4.6   AST 15 - 41 U/L 22  27  30    ALT 0 - 44 U/L 20  38  50   Alk Phosphatase 38 - 126 U/L 86  92  112   Total Bilirubin 0.3 - 1.2 mg/dL 0.8  0.9  0.6   Bilirubin, Direct 0.00 - 0.40 mg/dL   9.52        Latest Ref Rng & Units 10/06/2022   11:35 AM 09/28/2022    1:35 PM 06/10/2022   10:35 AM  CBC  WBC 4.0 - 10.5 K/uL 9.9  9.7  10.4   Hemoglobin 13.0 - 17.0 g/dL 84.1  32.4  40.1   Hematocrit 39.0 - 52.0 % 50.7  49.5  46.9   Platelets 150.0 - 400.0 K/uL 285.0  288  338      CUP PACEART REMOTE DEVICE CHECK Scheduled remote reviewed. Normal device function.   1 HVR episode, 4 sec, 189 bpm, no EGM available.  Next remote 91 days. [facility], [facility]   Current Medications, Allergies, Past Medical History, Past Surgical History, Family History and Social History were reviewed in [employer] record.   Physical Exam: General: Well developed, well nourished, no acute distress Head: Normocephalic and  atraumatic Eyes: Sclerae anicteric, EOMI Ears: Normal auditory acuity Mouth: No deformities or lesions noted Lungs: Clear throughout to auscultation Heart: Regular rate and rhythm; No murmurs, rubs or bruits Abdomen: Soft, non tender and non distended. No masses, hepatosplenomegaly or hernias noted. Normal Bowel sounds Rectal: Deferred to colonoscopy  Musculoskeletal: Symmetrical with no gross deformities  Pulses:  Normal pulses noted Extremities: No edema or deformities noted Neurological: Alert oriented x 4, grossly nonfocal Psychological:  Alert and cooperative. Normal mood and affect   Latayna Ritchie T. Russella Dar, MD 12/20/2022, 8:35 AM

## 2022-12-20 NOTE — Telephone Encounter (Signed)
Name: Billy [name]  DOB: [birth year]  MRN: [id number]  Primary Cardiologist: Olga Millers, MD  Chart reviewed as part of pre-operative protocol coverage. Because of Billy [name]'s past medical history and time since last visit, he will require a follow-up telephone visit in order to better assess preoperative cardiovascular risk.  Pre-op covering staff: - Please schedule appointment and call patient to inform them. If patient already had an upcoming appointment within acceptable timeframe, please add "pre-op clearance" to the appointment notes so provider is aware. - Please contact requesting surgeon's office via preferred method (i.e, phone, fax) to inform them of need for appointment prior to surgery.  Per office protocol, patient can hold Eliquis for 2 days prior to procedure.  Please resume Eliquis when medically safe to do so.    Sharlene Dory, PA-C  12/20/2022, 1:38 PM

## 2022-12-20 NOTE — Patient Instructions (Signed)
We have sent the following medications to your pharmacy for you to pick up at your convenience: famotidine 40 mg at bedtime.   You have been scheduled for a colonoscopy. Please follow written instructions given to you at your visit today.   Please pick up your prep supplies at the pharmacy within the next 1-3 days.  If you use inhalers (even only as needed), please bring them with you on the day of your procedure.  DO NOT TAKE 7 DAYS PRIOR TO TEST- Trulicity (dulaglutide) Ozempic, Wegovy (semaglutide) Mounjaro (tirzepatide) Bydureon Bcise (exanatide extended release)  DO NOT TAKE 1 DAY PRIOR TO YOUR TEST Rybelsus (semaglutide) Adlyxin (lixisenatide) Victoza (liraglutide) Byetta (exanatide) ___________________________________________________________________________   If your blood pressure at your visit was 140/90 or greater, please contact your primary care physician to follow up on this.  _______________________________________________________  If you are age 28 or older, your body mass index should be between 23-30. Your Body mass index is 38.22 kg/m. If this is out of the aforementioned range listed, please consider follow up with your Primary Care Provider.  If you are age 62 or younger, your body mass index should be between 19-25. Your Body mass index is 38.22 kg/m. If this is out of the aformentioned range listed, please consider follow up with your Primary Care Provider.   ________________________________________________________  The [facility] providers would like to encourage you to use [facility] to communicate with providers for non-urgent requests or questions.  Due to long hold times on the telephone, sending your provider a message by [facility] may be a faster and more efficient way to get a response.  Please allow 48 business hours for a response.  Please remember that this is for non-urgent requests.  _______________________________________________________

## 2022-12-20 NOTE — Telephone Encounter (Signed)
  Patient Consent for Virtual Visit         Billy [name] has provided verbal consent on 12/20/2022 for a virtual visit (video or telephone).   CONSENT FOR VIRTUAL VISIT FOR:  Billy [name]  By participating in this virtual visit I agree to the following:  I hereby voluntarily request, consent and authorize [facility] and its employed or contracted physicians, physician assistants, nurse practitioners or other licensed health care professionals (the Practitioner), to provide me with telemedicine health care services (the "Services") as deemed necessary by the treating Practitioner. I acknowledge and consent to receive the Services by the Practitioner via telemedicine. I understand that the telemedicine visit will involve communicating with the Practitioner through live audiovisual communication technology and the disclosure of certain medical information by electronic transmission. I acknowledge that I have been given the opportunity to request an in-person assessment or other available alternative prior to the telemedicine visit and am voluntarily participating in the telemedicine visit.  I understand that I have the right to withhold or withdraw my consent to the use of telemedicine in the course of my care at any time, without affecting my right to future care or treatment, and that the Practitioner or I may terminate the telemedicine visit at any time. I understand that I have the right to inspect all information obtained and/or recorded in the course of the telemedicine visit and may receive copies of available information for a reasonable fee.  I understand that some of the potential risks of receiving the Services via telemedicine include:  Delay or interruption in medical evaluation due to technological equipment failure or disruption; Information transmitted may not be sufficient (e.g. poor resolution of images) to allow for appropriate medical decision making by the  Practitioner; and/or  In rare instances, security protocols could fail, causing a breach of personal health information.  Furthermore, I acknowledge that it is my responsibility to provide information about my medical history, conditions and care that is complete and accurate to the best of my ability. I acknowledge that Practitioner's advice, recommendations, and/or decision may be based on factors not within their control, such as incomplete or inaccurate data provided by me or distortions of diagnostic images or specimens that may result from electronic transmissions. I understand that the practice of medicine is not an exact science and that Practitioner makes no warranties or guarantees regarding treatment outcomes. I acknowledge that a copy of this consent can be made available to me via my patient portal [facility] MyChart), or I can request a printed copy by calling the office of [facility].    I understand that my insurance will be billed for this visit.   I have read or had this consent read to me. I understand the contents of this consent, which adequately explains the benefits and risks of the Services being provided via telemedicine.  I have been provided ample opportunity to ask questions regarding this consent and the Services and have had my questions answered to my satisfaction. I give my informed consent for the services to be provided through the use of telemedicine in my medical care

## 2022-12-20 NOTE — H&P (View-Only) (Signed)
Assessment     Cologuard positive in Jan 2023.  R/O colorectal neoplasms.    2.  History of CAD, S/P CABG remotely, S/P PCI, history of VF arrest in 2010, history of SVT, ischemic cardiomyopathy, chronic systolic heart failure, S/P ICD, history of VT previously on amiodarone.  He is maintained on Plavix and Eliquis.   3.  GERD, occasional breakthrough symptoms   4.  CKD3  Recommendations    Schedule colonoscopy at hospital on Oct. 10.  He is higher risk.  The risks (including bleeding, perforation, infection, missed lesions, medication reactions and possible hospitalization or surgery if complications occur), benefits, and alternatives to colonoscopy with possible biopsy and possible polypectomy were discussed with the patient and they consent to proceed.   Hold Plavix 5 days and Eliquis 2 days before procedure - will instruct when and how to resume after procedure. Low but real risk of cardiovascular event such as heart attack, stroke, embolism, thrombosis or ischemia/infarct of other organs off Plavix and Eliquis explained and need to seek urgent help if this occurs. The patient consents to proceed. Will communicate by phone or EMR with patient's prescribing provider to confirm that holding Plavix and Elqiuis is reasonable in this case.   Add famotidine 40 mg po every day as needed. Follow antireflux measures.   HPI    This is a 57 year old male returning for positive Cologuard.  Cologuard was obtained in January 2023. Colonoscopy was recommended after ICD battery replacement and cardiology procedure risk stratification.  He was hospitalized with a left acetabular fracture, S/P ORIF in April 2023.  He returns today accompanied by his wife.  He relates occasional reflux breakthrough symptoms about every 2 to 3 weeks and takes an extra pantoprazole.  No other gastrointestinal complaints.  Denies weight loss, abdominal pain, constipation, diarrhea, change in stool caliber, melena,  hematochezia, nausea, vomiting, dysphagia, chest pain.    Labs / Imaging       Latest Ref Rng & Units 06/10/2022    2:32 PM 11/29/2020   10:18 AM 09/06/2020    9:52 AM  Hepatic Function  Total Protein 6.5 - 8.1 g/dL 6.8  7.7  7.6   Albumin 3.5 - 5.0 g/dL 3.5  4.4  4.6   AST 15 - 41 U/L 22  27  30    ALT 0 - 44 U/L 20  38  50   Alk Phosphatase 38 - 126 U/L 86  92  112   Total Bilirubin 0.3 - 1.2 mg/dL 0.8  0.9  0.6   Bilirubin, Direct 0.00 - 0.40 mg/dL   9.52        Latest Ref Rng & Units 10/06/2022   11:35 AM 09/28/2022    1:35 PM 06/10/2022   10:35 AM  CBC  WBC 4.0 - 10.5 K/uL 9.9  9.7  10.4   Hemoglobin 13.0 - 17.0 g/dL 84.1  32.4  40.1   Hematocrit 39.0 - 52.0 % 50.7  49.5  46.9   Platelets 150.0 - 400.0 K/uL 285.0  288  338      CUP PACEART REMOTE DEVICE CHECK Scheduled remote reviewed. Normal device function.   1 HVR episode, 4 sec, 189 bpm, no EGM available.  Next remote 91 days. [facility], [facility]   Current Medications, Allergies, Past Medical History, Past Surgical History, Family History and Social History were reviewed in [employer] record.   Physical Exam: General: Well developed, well nourished, no acute distress Head: Normocephalic and  atraumatic Eyes: Sclerae anicteric, EOMI Ears: Normal auditory acuity Mouth: No deformities or lesions noted Lungs: Clear throughout to auscultation Heart: Regular rate and rhythm; No murmurs, rubs or bruits Abdomen: Soft, non tender and non distended. No masses, hepatosplenomegaly or hernias noted. Normal Bowel sounds Rectal: Deferred to colonoscopy  Musculoskeletal: Symmetrical with no gross deformities  Pulses:  Normal pulses noted Extremities: No edema or deformities noted Neurological: Alert oriented x 4, grossly nonfocal Psychological:  Alert and cooperative. Normal mood and affect   Latayna Ritchie T. Russella Dar, MD 12/20/2022, 8:35 AM

## 2022-12-28 ENCOUNTER — Inpatient Hospital Stay: Payer: Medicare HMO | Attending: Nurse Practitioner

## 2022-12-28 ENCOUNTER — Encounter: Payer: Self-pay | Admitting: Nurse Practitioner

## 2022-12-28 ENCOUNTER — Inpatient Hospital Stay: Payer: Medicare HMO | Admitting: Nurse Practitioner

## 2022-12-28 VITALS — BP 92/53 | HR 81 | Temp 97.7°F | Resp 17 | Ht 71.0 in | Wt 269.4 lb

## 2022-12-28 DIAGNOSIS — D751 Secondary polycythemia: Secondary | ICD-10-CM

## 2022-12-28 DIAGNOSIS — I2510 Atherosclerotic heart disease of native coronary artery without angina pectoris: Secondary | ICD-10-CM | POA: Insufficient documentation

## 2022-12-28 DIAGNOSIS — Z79899 Other long term (current) drug therapy: Secondary | ICD-10-CM | POA: Diagnosis not present

## 2022-12-28 DIAGNOSIS — Z7901 Long term (current) use of anticoagulants: Secondary | ICD-10-CM | POA: Insufficient documentation

## 2022-12-28 DIAGNOSIS — E119 Type 2 diabetes mellitus without complications: Secondary | ICD-10-CM | POA: Diagnosis not present

## 2022-12-28 DIAGNOSIS — F17210 Nicotine dependence, cigarettes, uncomplicated: Secondary | ICD-10-CM | POA: Diagnosis not present

## 2022-12-28 DIAGNOSIS — E611 Iron deficiency: Secondary | ICD-10-CM | POA: Diagnosis not present

## 2022-12-28 DIAGNOSIS — I509 Heart failure, unspecified: Secondary | ICD-10-CM | POA: Diagnosis not present

## 2022-12-28 LAB — CBC WITH DIFFERENTIAL (CANCER CENTER ONLY)
Abs Immature Granulocytes: 0.02 10*3/uL (ref 0.00–0.07)
Basophils Absolute: 0.1 10*3/uL (ref 0.0–0.1)
Basophils Relative: 1 %
Eosinophils Absolute: 0.2 10*3/uL (ref 0.0–0.5)
Eosinophils Relative: 2 %
HCT: 54.6 % — ABNORMAL HIGH (ref 39.0–52.0)
Hemoglobin: 18.5 g/dL — ABNORMAL HIGH (ref 13.0–17.0)
Immature Granulocytes: 0 %
Lymphocytes Relative: 25 %
Lymphs Abs: 2.6 10*3/uL (ref 0.7–4.0)
MCH: 25.8 pg — ABNORMAL LOW (ref 26.0–34.0)
MCHC: 33.9 g/dL (ref 30.0–36.0)
MCV: 76 fL — ABNORMAL LOW (ref 80.0–100.0)
Monocytes Absolute: 0.9 10*3/uL (ref 0.1–1.0)
Monocytes Relative: 9 %
Neutro Abs: 6.5 10*3/uL (ref 1.7–7.7)
Neutrophils Relative %: 63 %
Platelet Count: 271 10*3/uL (ref 150–400)
RBC: 7.18 MIL/uL — ABNORMAL HIGH (ref 4.22–5.81)
RDW: 22.9 % — ABNORMAL HIGH (ref 11.5–15.5)
WBC Count: 10.2 10*3/uL (ref 4.0–10.5)
nRBC: 0 % (ref 0.0–0.2)

## 2022-12-28 LAB — FERRITIN: Ferritin: 75 ng/mL (ref 24–336)

## 2022-12-28 LAB — IRON AND IRON BINDING CAPACITY (CC-WL,HP ONLY)
Iron: 69 ug/dL (ref 45–182)
Saturation Ratios: 17 % — ABNORMAL LOW (ref 17.9–39.5)
TIBC: 410 ug/dL (ref 250–450)
UIBC: 341 ug/dL (ref 117–376)

## 2022-12-28 NOTE — Progress Notes (Signed)
Patient Care Team: Mliss Sax, MD as PCP - General (Family Medicine) Jens Som Madolyn Frieze, MD as PCP - Cardiology (Cardiology) Duke Salvia, MD as PCP - Electrophysiology (Cardiology) Janalyn Harder, MD (Inactive) as Consulting Physician (Dermatology) Pollyann Samples, NP as Nurse Practitioner (Hematology and Oncology)   CHIEF COMPLAINT: Follow-up iron deficiency and erythrocytosis  CURRENT THERAPY: Men's multivitamin, for iron deficiency without anemia  INTERVAL HISTORY [name] returns for follow-up as scheduled, last seen by me as a new patient 06/19/2022.  Workup showed iron deficiency, he started oral iron, tolerating well.  He is scheduled to undergo colonoscopy 01/11/2023 with Dr. Russella Dar.  Labs in July were slightly improved, serum iron increased to 46, and TIBC normalized.  Ferritin remained slightly low at 17. He has continued oral iron daily. He had sleep study done but never got results. He notes a cough he can't clear for 3-6 months. He has allergies, on flonase and singulair. Denies fever, chills, chest pain, dyspnea. Denies bleeding.   ROS  All other systems reviewed and negative  Past Medical History:  Diagnosis Date   Aortic atherosclerosis ([name])    on CXR   Blood in stool    C. difficile colitis    a. remote hx 2010.   Cardiac arrest - ventricular fibrillation 02/11/2009   a. 02/2009 s/p [employer] ICD.   Chronic combined systolic and diastolic CHF (congestive heart failure) ([name])    Chronic kidney disease, stage 3a ([name])    Coronary artery disease    a. PCI to Cx age 65. b. CABGx4 in 2003. c. BMS to OM1 in 12/2007. d. PTCA to Cx 01/2019.   Former tobacco use    Hyperlipidemia    Hypertension    Ischemic cardiomyopathy    Marijuana abuse    Myocardial infarct ([name])    NON-ST-SEGMENT ELEVATION MI   Obesity    PAD (peripheral artery disease) ([name])    PAF (paroxysmal atrial fibrillation) ([name])    Pathologic fracture of left acetabulum 07/03/2021    PSVT (paroxysmal supraventricular tachycardia)    Ventricular tachycardia ([name])      Past Surgical History:  Procedure Laterality Date   ABDOMINAL AORTOGRAM W/LOWER EXTREMITY N/A 05/14/2019   Procedure: ABDOMINAL AORTOGRAM W/LOWER EXTREMITY;  Surgeon: Iran Ouch, MD;  Location: [facility] INVASIVE CV LAB;  Service: Cardiovascular;  Laterality: N/A;   CARDIAC DEFIBRILLATOR PLACEMENT     STJ single chamber ICD implanted for secondary prevention   CIRCUMCISION     CORONARY ARTERY BYPASS GRAFT  06/17/01   X 4   CORONARY BALLOON ANGIOPLASTY N/A 01/22/2019   Procedure: CORONARY BALLOON ANGIOPLASTY;  Surgeon: Marykay Lex, MD;  Location: [facility] INVASIVE CV LAB;  Service: Cardiovascular;  Laterality: N/A;   HIP CLOSED REDUCTION Left 07/02/2021   Procedure: CLOSED REDUCTION HIP with placement of skeletal traction;  Surgeon: Jones Broom, MD;  Location: [facility] OR;  Service: Orthopedics;  Laterality: Left;   ICD GENERATOR CHANGEOUT N/A 06/17/2021   Procedure: ICD GENERATOR CHANGEOUT;  Surgeon: Duke Salvia, MD;  Location: [facility] INVASIVE CV LAB;  Service: Cardiovascular;  Laterality: N/A;   LEFT HEART CATH AND CORS/GRAFTS ANGIOGRAPHY N/A 01/22/2019   Procedure: LEFT HEART CATH AND CORS/GRAFTS ANGIOGRAPHY;  Surgeon: Marykay Lex, MD;  Location: [facility] INVASIVE CV LAB;  Service: Cardiovascular;  Laterality: N/A;   LEFT HEART CATH AND CORS/GRAFTS ANGIOGRAPHY N/A 05/18/2020   Procedure: LEFT HEART CATH AND CORS/GRAFTS ANGIOGRAPHY;  Surgeon: Lennette Bihari, MD;  Location: [facility]  INVASIVE CV LAB;  Service: Cardiovascular;  Laterality: N/A;   OPEN REDUCTION INTERNAL FIXATION ACETABULUM FRACTURE POSTERIOR Left 07/04/2021   Procedure: OPEN REDUCTION INTERNAL FIXATION ACETABULUM FRACTURE POSTERIOR;  Surgeon: Myrene Galas, MD;  Location: [facility] OR;  Service: Orthopedics;  Laterality: Left;   PERIPHERAL VASCULAR INTERVENTION Right 05/14/2019   Procedure: PERIPHERAL VASCULAR INTERVENTION;  Surgeon: Iran Ouch, MD;   Location: [facility] INVASIVE CV LAB;  Service: Cardiovascular;  Laterality: Right;   SVT ABLATION N/A 06/21/2020   Procedure: SVT ABLATION;  Surgeon: Marinus Maw, MD;  Location: [facility] INVASIVE CV LAB;  Service: Cardiovascular;  Laterality: N/A;     Outpatient Encounter Medications as of 12/28/2022  Medication Sig   alum & mag hydroxide-simeth (MAALOX MAX) 400-400-40 MG/5ML suspension Take 10 mLs by mouth every 6 (six) hours as needed for indigestion.   apixaban (ELIQUIS) 5 MG TABS tablet TAKE 1 TABLET TWICE DAILY   atorvastatin (LIPITOR) 80 MG tablet TAKE 1 TABLET EVERY DAY   carvedilol (COREG) 3.125 MG tablet TAKE 1 TABLET BY MOUTH TWICE A DAY WITH A MEAL   clopidogrel (PLAVIX) 75 MG tablet Take 1 tablet (75 mg total) by mouth daily.   empagliflozin (JARDIANCE) 10 MG TABS tablet Take 1 tablet (10 mg total) by mouth daily.   ezetimibe (ZETIA) 10 MG tablet TAKE 1 TABLET EVERY DAY   famotidine (PEPCID) 40 MG tablet Take 1 tablet (40 mg total) by mouth at bedtime.   fluticasone (FLONASE) 50 MCG/ACT nasal spray Place 2 sprays into both nostrils daily as needed for allergies or rhinitis.   furosemide (LASIX) 40 MG tablet TAKE 1 TABLET EVERY DAY   Iron, Ferrous Sulfate, 325 (65 Fe) MG TABS Take 325 mg by mouth daily.   isosorbide mononitrate (IMDUR) 30 MG 24 hr tablet Take 0.5 tablets (15 mg total) by mouth daily.   levothyroxine (SYNTHROID) 50 MCG tablet TAKE 1 TABLET BY MOUTH DAILY BEFORE BREAKFAST. 30 MINUTES BEFORE HAVING BREAKFAST.   montelukast (SINGULAIR) 10 MG tablet TAKE 1 TABLET EVERY DAY AS NEEDED FOR ALLERGIES   nitroGLYCERIN (NITROSTAT) 0.4 MG SL tablet DISSOLVE 1 TABLET UNDER THE TONGUE EVERY 5 MINUTES FOR 3 DOSES AS NEEDED FOR CHEST PAIN   pantoprazole (PROTONIX) 40 MG tablet Take 1 tablet (40 mg total) by mouth daily.   polyethylene glycol (MIRALAX / GLYCOLAX) 17 g packet Take 17 g by mouth daily.   potassium chloride SA (KLOR-CON M) 20 MEQ tablet Take 1 tablet (20 mEq total) by mouth  daily.   sacubitril-valsartan (ENTRESTO) 24-26 MG TAKE 1 TABLET TWICE DAILY   No facility-administered encounter medications on file as of 12/28/2022.     Today's Vitals   12/28/22 1221  BP: (!) 92/53  Pulse: 81  Resp: 17  Temp: 97.7 F (36.5 C)  TempSrc: Oral  SpO2: 97%  Weight: 269 lb 6.4 oz (122.2 kg)  Height: 5\' 11"  (1.803 m)   Body mass index is 37.57 kg/m.   PHYSICAL EXAM GENERAL:alert, no distress and comfortable SKIN: no rash  EYES: sclera clear NECK: without mass LYMPH:  no palpable cervical or supraclavicular lymphadenopathy  LUNGS: clear with normal breathing effort HEART: regular rate & rhythm, no lower extremity edema ABDOMEN: abdomen soft, non-tender and normal bowel sounds NEURO: alert & oriented x 3 with fluent speech, no focal motor/sensory deficits   CBC    Component Value Date/Time   WBC 10.2 12/28/2022 1151   WBC 9.9 10/06/2022 1135   RBC 7.18 (H) 12/28/2022 1151   HGB  18.5 (H) 12/28/2022 1151   HGB 16.3 05/19/2022 0822   HCT 54.6 (H) 12/28/2022 1151   HCT 54.9 (H) 05/19/2022 0822   PLT 271 12/28/2022 1151   PLT 346 05/19/2022 0822   MCV 76.0 (L) 12/28/2022 1151   MCV 68 (L) 05/19/2022 0822   MCH 25.8 (L) 12/28/2022 1151   MCHC 33.9 12/28/2022 1151   RDW 22.9 (H) 12/28/2022 1151   RDW 23.1 (H) 05/19/2022 0822   LYMPHSABS 2.6 12/28/2022 1151   LYMPHSABS 2.9 06/14/2020 1117   MONOABS 0.9 12/28/2022 1151   EOSABS 0.2 12/28/2022 1151   EOSABS 0.3 06/14/2020 1117   BASOSABS 0.1 12/28/2022 1151   BASOSABS 0.0 06/14/2020 1117     CMP     Component Value Date/Time   NA 137 10/06/2022 1135   NA 138 12/23/2021 0818   K 4.1 10/06/2022 1135   CL 104 10/06/2022 1135   CO2 25 10/06/2022 1135   GLUCOSE 108 (H) 10/06/2022 1135   BUN 14 10/06/2022 1135   BUN 16 12/23/2021 0818   CREATININE 1.23 10/06/2022 1135   CREATININE 1.09 01/07/2015 0942   CALCIUM 9.4 10/06/2022 1135   PROT 6.8 06/10/2022 1432   PROT 7.6 09/06/2020 0952   ALBUMIN 3.5  06/10/2022 1432   ALBUMIN 4.6 09/06/2020 0952   AST 22 06/10/2022 1432   ALT 20 06/10/2022 1432   ALKPHOS 86 06/10/2022 1432   BILITOT 0.8 06/10/2022 1432   BILITOT 0.6 09/06/2020 0952   GFRNONAA >60 06/10/2022 1432   GFRNONAA 84 12/10/2013 1548   GFRAA 79 04/01/2020 1541   GFRAA >89 12/10/2013 1548     ASSESSMENT & PLAN: 57 yo male    Secondary polycythemia, likely from untreated OSA -He had normal CBC In 2009, became abnormal after Vfib arrest in 02/2009. He has had mild intermittent leukocytosis, anemia, microcytosis, and erythrocytosis since then, exacerbated during hospital admissions for hip fracture and other acute conditions -He quit smoking 3 years ago, after smoking 2 PPD x20 years. He may have underlying lung disease. -No history of malignancy or abnormal findings on recent CT AP 06/10/22; no splenomegaly -Epo was slightly elevated which is nonspecific; JAK 2 normal; this is not MPN/PV. He does not need phlebotomy -This is likely secondary to OSA. He showed me sleep study results from 08/2022 with moderate OSA score 16.2 and lowest spO2 77%.  -He has not heard from pulm with the results and does not have CPAP. I will ask them to follow up  -Mr. Mcconnel appears stable. He has been on oral iron 6 months. Today's  CBC shows progressive erythrocytosis, with elevated hgb 18 from oral iron -Iron panel has improved. He can hold oral iron now.  -Continue monitoring; lab q3 months -F/up in 6 months    Microcytosis -He has intermittent microcytosis, with and without anemia -Likely somewhat related to iron deficiency from blood loss secondary to plavix/eliquis. Improved slightly on oral iron -Have not ruled out thalassemia yet -3 Nieces have sickle cell anemia (sister's children)   V-fib arrest, CAD, CHF, HL, DM -On eliquis, plavix, lipitor, entresto, and jardiance per cardiology and PCP -Per pt these conditions are controlled lately     Low testosterone -Found on workup for hip  fracture in 06/2021 -Told to follow-up with endocrinology after discharge but was not referred. -Informed/cautioned patient that testosterone injection can cause secondary polycythemia -Defer to PCP   Health maintenance -I encouraged him to continue healthy active lifestyle, avoid smoking, limit alcohol, hydrate adequately, and  engage in age-appropriate cancer screenings -Last PSA in 2022, normal -He had + cologuard in the past; overdue for colonoscopy which is now scheduled on 10/10 with Dr. Russella Dar -He has intermittent leukocytosis, with elevated neutrophils and monocytes. Denies recurrent infection. He does have allergies, on singulair and flonase     PLAN: -Today's labs reviewed, Hgb 18 -Hold oral iron -Colonoscopy 10/10 per Dr. Russella Dar -F/up pulm for sleep study results, will likely need CPAP - likely contributing to his secondary polycythemia -Lab in 3 and 6 months -F/up with me in 6 months, or sooner if needed   All questions were answered. The patient knows to call the clinic with any problems, questions or concerns. No barriers to learning were detected. I spent 20 minutes counseling the patient face to face. The total time spent in the appointment was 30 minutes and more than 50% was on counseling, review of test results, and coordination of care.   Santiago Glad, NP-C 12/28/2022

## 2023-01-01 ENCOUNTER — Telehealth: Payer: Self-pay

## 2023-01-01 ENCOUNTER — Telehealth: Payer: Self-pay | Admitting: Nurse Practitioner

## 2023-01-01 NOTE — Telephone Encounter (Addendum)
Contacted patient to relay message below as per Santiago Glad NP. Patient voiced full understanding.    ----- Message from Pollyann Samples sent at 01/01/2023  8:21 AM EDT ----- Please let him know his iron level responded well to oral iron, but since the hemoglobin is high he can stop oral iron completely for now. I spoke with the pulmonary NP who's office will be calling him about the sleep study.   Thanks, Clayborn Heron NP

## 2023-01-01 NOTE — Telephone Encounter (Signed)
Spoke with pt and spouse to inform pt that Santiago Glad, NP has reviewed his recent labs.  Stated that pt's iron levels are WNL.  Stated Lacie feels the pt's body responded well to PO Iron and would now like for the pt to stop taking PO Iron.  Stated Clayborn Heron has contacted the Pulmonary NP and they will be contacting the pt about getting him scheduled for a sleep study.  Pt and spouse verbalized understanding and had no further questions or concerns at this time.

## 2023-01-03 ENCOUNTER — Encounter (HOSPITAL_COMMUNITY): Payer: Self-pay | Admitting: Gastroenterology

## 2023-01-03 ENCOUNTER — Ambulatory Visit: Payer: Medicare HMO | Attending: Nurse Practitioner

## 2023-01-03 DIAGNOSIS — Z01810 Encounter for preprocedural cardiovascular examination: Secondary | ICD-10-CM

## 2023-01-03 NOTE — Telephone Encounter (Signed)
Expand All Collapse All     Virtual Visit via Telephone Note    Because of Billy [name]'s co-morbid illnesses, he is at least at moderate risk for complications without adequate follow up.  This format is felt to be most appropriate for this patient at this time.  The patient did not have access to video technology/had technical difficulties with video requiring transitioning to audio format only (telephone).  All issues noted in this document were discussed and addressed.  No physical exam could be performed with this format.  Please refer to the patient's chart for his consent to telehealth for [facility].   Evaluation Performed:  Preoperative cardiovascular risk assessment _____________    Date:  01/03/2023    Patient ID:  Billy [name], DOB Jul 18, 1965, MRN [id number] Patient Location:  Home Provider location:   Office   Primary Care Provider:  Mliss Sax, MD Primary Cardiologist:  Olga Millers, MD   Chief Complaint / Patient Profile    57 y.o. y/o male with a h/o CAD s/p cardiac arrest 2010, CABG, VT s/p St Jude ICD, stage III CKD, hypertension, hyperlipidemia, ischemic cardiomyopathy, PAF, and PAD  who is pending colonoscopy and presents today for telephonic preoperative cardiovascular risk assessment.   History of Present Illness    Billy [name] is a 57 y.o. male who presents via audio/video conferencing for a telehealth visit today.  Pt was last seen in cardiology clinic on 06/15/2022 by Dr. Jens Som.  At that time Billy [name] was doing well but endorsed shortness of breath with heavy exertion but no chest pain. The patient is now pending procedure as outlined above. Since his last visit, he walks 10-15 minutes at home.  He is unable to walk around his neighborhood or at a park due to recent hip surgery but is working his way to becoming more functionally stable.   He denies chest pain, shortness of breath, lower extremity edema, fatigue, palpitations,  melena, hematuria, hemoptysis, diaphoresis, weakness, presyncope, syncope, orthopnea, and PND.    Past Medical History        Past Medical History:  Diagnosis Date   Aortic atherosclerosis ([name])      on CXR   Blood in stool     C. difficile colitis      a. remote hx 2010.   Cardiac arrest - ventricular fibrillation 02/11/2009    a. 02/2009 s/p St Jude ICD.   Chronic combined systolic and diastolic CHF (congestive heart failure) ([name])     Chronic kidney disease, stage 3a ([name])     Coronary artery disease      a. PCI to Cx age 49. b. CABGx4 in 2003. c. BMS to OM1 in 12/2007. d. PTCA to Cx 01/2019.   Former tobacco use     Hyperlipidemia     Hypertension     Ischemic cardiomyopathy     Marijuana abuse     Myocardial infarct ([name])      NON-ST-SEGMENT ELEVATION MI   Obesity     PAD (peripheral artery disease) ([name])     PAF (paroxysmal atrial fibrillation) ([name])     Pathologic fracture of left acetabulum 07/03/2021   PSVT (paroxysmal supraventricular tachycardia)     Ventricular tachycardia ([name])               Past Surgical History:  Procedure Laterality Date   ABDOMINAL AORTOGRAM W/LOWER EXTREMITY N/A 05/14/2019    Procedure: ABDOMINAL AORTOGRAM W/LOWER EXTREMITY;  Surgeon: Iran Ouch, MD;  [facility] INVASIVE CV LAB;  Service: Cardiovascular;  Laterality: N/A;   CARDIAC DEFIBRILLATOR PLACEMENT        STJ single chamber ICD implanted for secondary prevention   CIRCUMCISION       CORONARY ARTERY BYPASS GRAFT   06/17/01    X 4   CORONARY BALLOON ANGIOPLASTY N/A 01/22/2019    Procedure: CORONARY BALLOON ANGIOPLASTY;  Surgeon: Marykay Lex, MD;  Location: [facility] INVASIVE CV LAB;  Service: Cardiovascular;  Laterality: N/A;   HIP CLOSED REDUCTION Left 07/02/2021    Procedure: CLOSED REDUCTION HIP with placement of skeletal traction;  Surgeon: Jones Broom, MD;  Location: [facility] OR;  Service: Orthopedics;  Laterality: Left;   ICD GENERATOR CHANGEOUT N/A 06/17/2021     Procedure: ICD GENERATOR CHANGEOUT;  Surgeon: Duke Salvia, MD;  Location: [facility] INVASIVE CV LAB;  Service: Cardiovascular;  Laterality: N/A;   LEFT HEART CATH AND CORS/GRAFTS ANGIOGRAPHY N/A 01/22/2019    Procedure: LEFT HEART CATH AND CORS/GRAFTS ANGIOGRAPHY;  Surgeon: Marykay Lex, MD;  Location: [facility] INVASIVE CV LAB;  Service: Cardiovascular;  Laterality: N/A;   LEFT HEART CATH AND CORS/GRAFTS ANGIOGRAPHY N/A 05/18/2020    Procedure: LEFT HEART CATH AND CORS/GRAFTS ANGIOGRAPHY;  Surgeon: Lennette Bihari, MD;  Location: [facility] INVASIVE CV LAB;  Service: Cardiovascular;  Laterality: N/A;   OPEN REDUCTION INTERNAL FIXATION ACETABULUM FRACTURE POSTERIOR Left 07/04/2021    Procedure: OPEN REDUCTION INTERNAL FIXATION ACETABULUM FRACTURE POSTERIOR;  Surgeon: Myrene Galas, MD;  Location: [facility] OR;  Service: Orthopedics;  Laterality: Left;   PERIPHERAL VASCULAR INTERVENTION Right 05/14/2019    Procedure: PERIPHERAL VASCULAR INTERVENTION;  Surgeon: Iran Ouch, MD;  Location: [facility] INVASIVE CV LAB;  Service: Cardiovascular;  Laterality: Right;   SVT ABLATION N/A 06/21/2020    Procedure: SVT ABLATION;  Surgeon: Marinus Maw, MD;  Location: [facility] INVASIVE CV LAB;  Service: Cardiovascular;  Laterality: N/A;          Allergies   Allergies  No Known Allergies     Home Medications           Prior to Admission medications   Medication Sig Start Date End Date Taking? Authorizing Provider  alum & mag hydroxide-simeth (MAALOX MAX) 400-400-40 MG/5ML suspension Take 10 mLs by mouth every 6 (six) hours as needed for indigestion. 06/10/22     Horton, Clabe Seal, DO  apixaban (ELIQUIS) 5 MG TABS tablet TAKE 1 TABLET TWICE DAILY 12/07/22     Lewayne Bunting, MD  atorvastatin (LIPITOR) 80 MG tablet TAKE 1 TABLET EVERY DAY 07/13/22     Duke Salvia, MD  carvedilol (COREG) 3.125 MG tablet TAKE 1 TABLET BY MOUTH TWICE A DAY WITH A MEAL 06/23/22     Lewayne Bunting, MD  clopidogrel (PLAVIX) 75 MG tablet Take 1 tablet  (75 mg total) by mouth daily. 05/16/22     Iran Ouch, MD  empagliflozin (JARDIANCE) 10 MG TABS tablet Take 1 tablet (10 mg total) by mouth daily. 06/23/22     Lewayne Bunting, MD  ezetimibe (ZETIA) 10 MG tablet TAKE 1 TABLET EVERY DAY 08/29/22     Lewayne Bunting, MD  famotidine (PEPCID) 40 MG tablet Take 1 tablet (40 mg total) by mouth at bedtime. 12/20/22     Meryl Dare, MD  fluticasone (FLONASE) 50 MCG/ACT nasal spray Place 2 sprays into both nostrils daily as needed for allergies or rhinitis. 07/12/22     Mliss Sax, MD  furosemide (LASIX) 40  MG tablet TAKE 1 TABLET EVERY DAY 07/03/22     Lewayne Bunting, MD  Iron, Ferrous Sulfate, 325 (65 Fe) MG TABS Take 325 mg by mouth daily. 10/17/22     Mliss Sax, MD  isosorbide mononitrate (IMDUR) 30 MG 24 hr tablet Take 0.5 tablets (15 mg total) by mouth daily. 05/22/22     Graciella Freer, PA-C  levothyroxine (SYNTHROID) 50 MCG tablet TAKE 1 TABLET BY MOUTH DAILY BEFORE BREAKFAST. 30 MINUTES BEFORE HAVING BREAKFAST. 07/12/22     Mliss Sax, MD  montelukast (SINGULAIR) 10 MG tablet TAKE 1 TABLET EVERY DAY AS NEEDED FOR ALLERGIES 09/16/20     Rema Fendt, NP  nitroGLYCERIN (NITROSTAT) 0.4 MG SL tablet DISSOLVE 1 TABLET UNDER THE TONGUE EVERY 5 MINUTES FOR 3 DOSES AS NEEDED FOR CHEST PAIN 07/06/22     Iran Ouch, MD  pantoprazole (PROTONIX) 40 MG tablet Take 1 tablet (40 mg total) by mouth daily. 06/23/22     Lewayne Bunting, MD  polyethylene glycol (MIRALAX / GLYCOLAX) 17 g packet Take 17 g by mouth daily. 07/09/21     Burnadette Pop, MD  potassium chloride SA (KLOR-CON M) 20 MEQ tablet Take 1 tablet (20 mEq total) by mouth daily. 11/20/22     Lewayne Bunting, MD  sacubitril-valsartan (ENTRESTO) 24-26 MG TAKE 1 TABLET TWICE DAILY 07/13/22     Duke Salvia, MD      Physical Exam    Vital Signs:  Billy [name] does not have vital signs available for review today.   Given telephonic nature  of communication, physical exam is limited. AAOx3. NAD. Normal affect.  Speech and respirations are unlabored.   Accessory Clinical Findings    None   Assessment & Plan    1.  Preoperative Cardiovascular Risk Assessment: -Patient's RCRI score is 6.6%   The patient affirms he has been doing well without any new cardiac symptoms. They are able to achieve 5 METS without cardiac limitations. Therefore, based on ACC/AHA guidelines, the patient would be at acceptable risk for the planned procedure without further cardiovascular testing. The patient was advised that if he develops new symptoms prior to surgery to contact our office to arrange for a follow-up visit, and he verbalized understanding.    The patient was advised that if he develops new symptoms prior to surgery to contact our office to arrange for a follow-up visit, and he verbalized understanding.   Patient can hold Plavix 5 days prior to procedure and should hold Eliquis 2 days prior to procedure and restart both medications postprocedure when surgically safe and guided by gastroenterologist.   A copy of this note will be routed to requesting surgeon.   Time:   Today, I have spent 8 minutes with the patient with telehealth technology discussing medical history, symptoms, and management plan.       Napoleon Form, Leodis Rains, NP

## 2023-01-03 NOTE — Progress Notes (Signed)
Virtual Visit via Telephone Note   Because of [name]'s co-morbid illnesses, he is at least at moderate risk for complications without adequate follow up.  This format is felt to be most appropriate for this patient at this time.  The patient did not have access to video technology/had technical difficulties with video requiring transitioning to audio format only (telephone).  All issues noted in this document were discussed and addressed.  No physical exam could be performed with this format.  Please refer to the patient's chart for his consent to telehealth for [facility].  Evaluation Performed:  Preoperative cardiovascular risk assessment _____________   Date:  01/03/2023   Patient ID:  Billy [name], DOB 05-19-1965, MRN [id number] Patient Location:  Home Provider location:   Office  Primary Care Provider:  Mliss Sax, MD Primary Cardiologist:  Olga Millers, MD  Chief Complaint / Patient Profile   57 y.o. y/o male with a h/o CAD s/p cardiac arrest 2010, CABG, VT s/p St Jude ICD, stage III CKD, hypertension, hyperlipidemia, ischemic cardiomyopathy, PAF, and PAD  who is pending colonoscopy and presents today for telephonic preoperative cardiovascular risk assessment.  History of Present Illness    Billy [name] is a 57 y.o. male who presents via audio/video conferencing for a telehealth visit today.  Pt was last seen in cardiology clinic on 06/15/2022 by Dr. Jens Som.  At that time Billy [name] was doing well but endorsed shortness of breath with heavy exertion but no chest pain. The patient is now pending procedure as outlined above. Since his last visit, he walks 10-15 minutes at home.  He is unable to walk around his neighborhood or at a park due to recent hip surgery but is working his way to becoming more functionally stable.  He denies chest pain, shortness of breath, lower extremity edema, fatigue, palpitations, melena, hematuria, hemoptysis,  diaphoresis, weakness, presyncope, syncope, orthopnea, and PND.   Past Medical History    Past Medical History:  Diagnosis Date   Aortic atherosclerosis ([name])    on CXR   Blood in stool    C. difficile colitis    a. remote hx 2010.   Cardiac arrest - ventricular fibrillation 02/11/2009   a. 02/2009 s/p St Jude ICD.   Chronic combined systolic and diastolic CHF (congestive heart failure) ([name])    Chronic kidney disease, stage 3a ([name])    Coronary artery disease    a. PCI to Cx age 9. b. CABGx4 in 2003. c. BMS to OM1 in 12/2007. d. PTCA to Cx 01/2019.   Former tobacco use    Hyperlipidemia    Hypertension    Ischemic cardiomyopathy    Marijuana abuse    Myocardial infarct ([name])    NON-ST-SEGMENT ELEVATION MI   Obesity    PAD (peripheral artery disease) ([name])    PAF (paroxysmal atrial fibrillation) ([name])    Pathologic fracture of left acetabulum 07/03/2021   PSVT (paroxysmal supraventricular tachycardia)    Ventricular tachycardia ([name])    Past Surgical History:  Procedure Laterality Date   ABDOMINAL AORTOGRAM W/LOWER EXTREMITY N/A 05/14/2019   Procedure: ABDOMINAL AORTOGRAM W/LOWER EXTREMITY;  Surgeon: Iran Ouch, MD;  Location: [facility] INVASIVE CV LAB;  Service: Cardiovascular;  Laterality: N/A;   CARDIAC DEFIBRILLATOR PLACEMENT     STJ single chamber ICD implanted for secondary prevention   CIRCUMCISION     CORONARY ARTERY BYPASS GRAFT  06/17/01   X 4   CORONARY BALLOON ANGIOPLASTY N/A 01/22/2019   Procedure:  CORONARY BALLOON ANGIOPLASTY;  Surgeon: Marykay Lex, MD;  Location: [facility] INVASIVE CV LAB;  Service: Cardiovascular;  Laterality: N/A;   HIP CLOSED REDUCTION Left 07/02/2021   Procedure: CLOSED REDUCTION HIP with placement of skeletal traction;  Surgeon: Jones Broom, MD;  Location: [facility] OR;  Service: Orthopedics;  Laterality: Left;   ICD GENERATOR CHANGEOUT N/A 06/17/2021   Procedure: ICD GENERATOR CHANGEOUT;  Surgeon: Duke Salvia, MD;  Location: [facility] INVASIVE CV  LAB;  Service: Cardiovascular;  Laterality: N/A;   LEFT HEART CATH AND CORS/GRAFTS ANGIOGRAPHY N/A 01/22/2019   Procedure: LEFT HEART CATH AND CORS/GRAFTS ANGIOGRAPHY;  Surgeon: Marykay Lex, MD;  Location: [facility] INVASIVE CV LAB;  Service: Cardiovascular;  Laterality: N/A;   LEFT HEART CATH AND CORS/GRAFTS ANGIOGRAPHY N/A 05/18/2020   Procedure: LEFT HEART CATH AND CORS/GRAFTS ANGIOGRAPHY;  Surgeon: Lennette Bihari, MD;  Location: [facility] INVASIVE CV LAB;  Service: Cardiovascular;  Laterality: N/A;   OPEN REDUCTION INTERNAL FIXATION ACETABULUM FRACTURE POSTERIOR Left 07/04/2021   Procedure: OPEN REDUCTION INTERNAL FIXATION ACETABULUM FRACTURE POSTERIOR;  Surgeon: Myrene Galas, MD;  Location: [facility] OR;  Service: Orthopedics;  Laterality: Left;   PERIPHERAL VASCULAR INTERVENTION Right 05/14/2019   Procedure: PERIPHERAL VASCULAR INTERVENTION;  Surgeon: Iran Ouch, MD;  Location: [facility] INVASIVE CV LAB;  Service: Cardiovascular;  Laterality: Right;   SVT ABLATION N/A 06/21/2020   Procedure: SVT ABLATION;  Surgeon: Marinus Maw, MD;  Location: [facility] INVASIVE CV LAB;  Service: Cardiovascular;  Laterality: N/A;    Allergies  No Known Allergies  Home Medications    Prior to Admission medications   Medication Sig Start Date End Date Taking? Authorizing Provider  alum & mag hydroxide-simeth (MAALOX MAX) 400-400-40 MG/5ML suspension Take 10 mLs by mouth every 6 (six) hours as needed for indigestion. 06/10/22   Horton, Clabe Seal, DO  apixaban (ELIQUIS) 5 MG TABS tablet TAKE 1 TABLET TWICE DAILY 12/07/22   Lewayne Bunting, MD  atorvastatin (LIPITOR) 80 MG tablet TAKE 1 TABLET EVERY DAY 07/13/22   Duke Salvia, MD  carvedilol (COREG) 3.125 MG tablet TAKE 1 TABLET BY MOUTH TWICE A DAY WITH A MEAL 06/23/22   Lewayne Bunting, MD  clopidogrel (PLAVIX) 75 MG tablet Take 1 tablet (75 mg total) by mouth daily. 05/16/22   Iran Ouch, MD  empagliflozin (JARDIANCE) 10 MG TABS tablet Take 1 tablet (10 mg total) by  mouth daily. 06/23/22   Lewayne Bunting, MD  ezetimibe (ZETIA) 10 MG tablet TAKE 1 TABLET EVERY DAY 08/29/22   Lewayne Bunting, MD  famotidine (PEPCID) 40 MG tablet Take 1 tablet (40 mg total) by mouth at bedtime. 12/20/22   Meryl Dare, MD  fluticasone (FLONASE) 50 MCG/ACT nasal spray Place 2 sprays into both nostrils daily as needed for allergies or rhinitis. 07/12/22   Mliss Sax, MD  furosemide (LASIX) 40 MG tablet TAKE 1 TABLET EVERY DAY 07/03/22   Lewayne Bunting, MD  Iron, Ferrous Sulfate, 325 (65 Fe) MG TABS Take 325 mg by mouth daily. 10/17/22   Mliss Sax, MD  isosorbide mononitrate (IMDUR) 30 MG 24 hr tablet Take 0.5 tablets (15 mg total) by mouth daily. 05/22/22   Graciella Freer, PA-C  levothyroxine (SYNTHROID) 50 MCG tablet TAKE 1 TABLET BY MOUTH DAILY BEFORE BREAKFAST. 30 MINUTES BEFORE HAVING BREAKFAST. 07/12/22   Mliss Sax, MD  montelukast (SINGULAIR) 10 MG tablet TAKE 1 TABLET EVERY DAY AS NEEDED FOR ALLERGIES 09/16/20   Zonia Kief,  Amy J, NP  nitroGLYCERIN (NITROSTAT) 0.4 MG SL tablet DISSOLVE 1 TABLET UNDER THE TONGUE EVERY 5 MINUTES FOR 3 DOSES AS NEEDED FOR CHEST PAIN 07/06/22   Iran Ouch, MD  pantoprazole (PROTONIX) 40 MG tablet Take 1 tablet (40 mg total) by mouth daily. 06/23/22   Lewayne Bunting, MD  polyethylene glycol (MIRALAX / GLYCOLAX) 17 g packet Take 17 g by mouth daily. 07/09/21   Burnadette Pop, MD  potassium chloride SA (KLOR-CON M) 20 MEQ tablet Take 1 tablet (20 mEq total) by mouth daily. 11/20/22   Lewayne Bunting, MD  sacubitril-valsartan (ENTRESTO) 24-26 MG TAKE 1 TABLET TWICE DAILY 07/13/22   Duke Salvia, MD    Physical Exam    Vital Signs:  Billy [name] does not have vital signs available for review today.  Given telephonic nature of communication, physical exam is limited. AAOx3. NAD. Normal affect.  Speech and respirations are unlabored.  Accessory Clinical Findings    None  Assessment &  Plan    1.  Preoperative Cardiovascular Risk Assessment: -Patient's RCRI score is 6.6%  The patient affirms he has been doing well without any new cardiac symptoms. They are able to achieve 5 METS without cardiac limitations. Therefore, based on ACC/AHA guidelines, the patient would be at acceptable risk for the planned procedure without further cardiovascular testing. The patient was advised that if he develops new symptoms prior to surgery to contact our office to arrange for a follow-up visit, and he verbalized understanding.   The patient was advised that if he develops new symptoms prior to surgery to contact our office to arrange for a follow-up visit, and he verbalized understanding.  Patient can hold Plavix 5 days prior to procedure and should hold Eliquis 2 days prior to procedure and restart both medications postprocedure when surgically safe and guided by gastroenterologist.  A copy of this note will be routed to requesting surgeon.  Time:   Today, I have spent 8 minutes with the patient with telehealth technology discussing medical history, symptoms, and management plan.     Napoleon Form, Leodis Rains, NP  01/03/2023, 7:10 AM

## 2023-01-03 NOTE — Progress Notes (Signed)
Remote ICD transmission.   

## 2023-01-11 ENCOUNTER — Encounter (HOSPITAL_COMMUNITY)
Admission: RE | Disposition: A | Discharge status: Home / Self Care | Payer: Self-pay | Source: Ambulatory Visit | Attending: Gastroenterology

## 2023-01-11 ENCOUNTER — Encounter (HOSPITAL_COMMUNITY): Payer: Self-pay | Admitting: Gastroenterology

## 2023-01-11 ENCOUNTER — Ambulatory Visit (HOSPITAL_BASED_OUTPATIENT_CLINIC_OR_DEPARTMENT_OTHER): Payer: Medicare HMO | Admitting: Anesthesiology

## 2023-01-11 ENCOUNTER — Ambulatory Visit (HOSPITAL_COMMUNITY)
Admission: RE | Admit: 2023-01-11 | Discharge: 2023-01-11 | Disposition: A | Discharge status: Home / Self Care | Payer: Medicare HMO | Source: Ambulatory Visit | Attending: Gastroenterology | Admitting: Gastroenterology

## 2023-01-11 ENCOUNTER — Ambulatory Visit (HOSPITAL_COMMUNITY): Payer: Medicare HMO | Admitting: Anesthesiology

## 2023-01-11 ENCOUNTER — Other Ambulatory Visit: Payer: Self-pay

## 2023-01-11 DIAGNOSIS — Z87891 Personal history of nicotine dependence: Secondary | ICD-10-CM | POA: Diagnosis not present

## 2023-01-11 DIAGNOSIS — I130 Hypertensive heart and chronic kidney disease with heart failure and stage 1 through stage 4 chronic kidney disease, or unspecified chronic kidney disease: Secondary | ICD-10-CM | POA: Diagnosis not present

## 2023-01-11 DIAGNOSIS — I2510 Atherosclerotic heart disease of native coronary artery without angina pectoris: Secondary | ICD-10-CM | POA: Insufficient documentation

## 2023-01-11 DIAGNOSIS — Z955 Presence of coronary angioplasty implant and graft: Secondary | ICD-10-CM | POA: Diagnosis not present

## 2023-01-11 DIAGNOSIS — Z791 Long term (current) use of non-steroidal anti-inflammatories (NSAID): Secondary | ICD-10-CM | POA: Diagnosis not present

## 2023-01-11 DIAGNOSIS — Z79899 Other long term (current) drug therapy: Secondary | ICD-10-CM | POA: Diagnosis not present

## 2023-01-11 DIAGNOSIS — D123 Benign neoplasm of transverse colon: Secondary | ICD-10-CM

## 2023-01-11 DIAGNOSIS — I5022 Chronic systolic (congestive) heart failure: Secondary | ICD-10-CM | POA: Diagnosis not present

## 2023-01-11 DIAGNOSIS — R195 Other fecal abnormalities: Secondary | ICD-10-CM

## 2023-01-11 DIAGNOSIS — N183 Chronic kidney disease, stage 3 unspecified: Secondary | ICD-10-CM | POA: Diagnosis not present

## 2023-01-11 DIAGNOSIS — Z8674 Personal history of sudden cardiac arrest: Secondary | ICD-10-CM | POA: Diagnosis not present

## 2023-01-11 DIAGNOSIS — D126 Benign neoplasm of colon, unspecified: Secondary | ICD-10-CM

## 2023-01-11 DIAGNOSIS — K5730 Diverticulosis of large intestine without perforation or abscess without bleeding: Secondary | ICD-10-CM | POA: Insufficient documentation

## 2023-01-11 DIAGNOSIS — Z7902 Long term (current) use of antithrombotics/antiplatelets: Secondary | ICD-10-CM | POA: Insufficient documentation

## 2023-01-11 DIAGNOSIS — I255 Ischemic cardiomyopathy: Secondary | ICD-10-CM | POA: Insufficient documentation

## 2023-01-11 DIAGNOSIS — D125 Benign neoplasm of sigmoid colon: Secondary | ICD-10-CM

## 2023-01-11 DIAGNOSIS — D122 Benign neoplasm of ascending colon: Secondary | ICD-10-CM

## 2023-01-11 DIAGNOSIS — K219 Gastro-esophageal reflux disease without esophagitis: Secondary | ICD-10-CM | POA: Diagnosis not present

## 2023-01-11 DIAGNOSIS — Z951 Presence of aortocoronary bypass graft: Secondary | ICD-10-CM | POA: Insufficient documentation

## 2023-01-11 DIAGNOSIS — K640 First degree hemorrhoids: Secondary | ICD-10-CM | POA: Diagnosis not present

## 2023-01-11 DIAGNOSIS — K635 Polyp of colon: Secondary | ICD-10-CM | POA: Diagnosis not present

## 2023-01-11 DIAGNOSIS — I739 Peripheral vascular disease, unspecified: Secondary | ICD-10-CM | POA: Diagnosis not present

## 2023-01-11 DIAGNOSIS — Z1211 Encounter for screening for malignant neoplasm of colon: Secondary | ICD-10-CM | POA: Diagnosis not present

## 2023-01-11 DIAGNOSIS — D124 Benign neoplasm of descending colon: Secondary | ICD-10-CM

## 2023-01-11 DIAGNOSIS — Z7901 Long term (current) use of anticoagulants: Secondary | ICD-10-CM

## 2023-01-11 HISTORY — PX: HEMOSTASIS CLIP PLACEMENT: SHX6857

## 2023-01-11 HISTORY — PX: POLYPECTOMY: SHX5525

## 2023-01-11 HISTORY — PX: COLONOSCOPY WITH PROPOFOL: SHX5780

## 2023-01-11 SURGERY — COLONOSCOPY WITH PROPOFOL
Anesthesia: Monitor Anesthesia Care

## 2023-01-11 MED ORDER — PROPOFOL 10 MG/ML IV BOLUS
INTRAVENOUS | Status: DC | PRN
  Administered 2023-01-11: 30 mg via INTRAVENOUS
  Administered 2023-01-11: 20 mg via INTRAVENOUS

## 2023-01-11 MED ORDER — PROPOFOL 500 MG/50ML IV EMUL
INTRAVENOUS | Status: AC
  Filled 2023-01-11: qty 50

## 2023-01-11 MED ORDER — PROPOFOL 500 MG/50ML IV EMUL
INTRAVENOUS | Status: DC | PRN
  Administered 2023-01-11: 125 ug/kg/min via INTRAVENOUS

## 2023-01-11 MED ORDER — SODIUM CHLORIDE 0.9 % IV SOLN: INTRAVENOUS | Status: DC

## 2023-01-11 MED ORDER — PROPOFOL 10 MG/ML IV BOLUS
INTRAVENOUS | Status: AC
  Filled 2023-01-11: qty 20

## 2023-01-11 MED ORDER — PHENYLEPHRINE 80 MCG/ML (10ML) SYRINGE FOR IV PUSH (FOR BLOOD PRESSURE SUPPORT)
PREFILLED_SYRINGE | INTRAVENOUS | Status: DC | PRN
Start: 2023-01-11 — End: 2023-01-11
  Administered 2023-01-11 (×2): 80 ug via INTRAVENOUS

## 2023-01-11 SURGICAL SUPPLY — 22 items

## 2023-01-11 NOTE — Anesthesia Preprocedure Evaluation (Addendum)
Anesthesia Evaluation  Patient identified by MRN, date of birth, ID band Patient awake    Reviewed: Allergy & Precautions, NPO status , Patient's Chart, lab work & pertinent test results  Airway Mallampati: II  TM Distance: >3 FB Neck ROM: Full    Dental  (+) Missing,    Pulmonary former smoker   breath sounds clear to auscultation       Cardiovascular hypertension, + CAD, + CABG, + Peripheral Vascular Disease and +CHF  + dysrhythmias Atrial Fibrillation  Rhythm:Regular Rate:Normal     Neuro/Psych negative neurological ROS  negative psych ROS   GI/Hepatic Neg liver ROS,GERD  ,,  Endo/Other  Hypothyroidism    Renal/GU Renal disease     Musculoskeletal negative musculoskeletal ROS (+)    Abdominal   Peds  Hematology   Anesthesia Other Findings   Reproductive/Obstetrics                             Anesthesia Physical Anesthesia Plan  ASA: 3  Anesthesia Plan: MAC   Post-op Pain Management: Minimal or no pain anticipated   Induction: Intravenous  PONV Risk Score and Plan: 0 and Propofol infusion  Airway Management Planned: Natural Airway and Nasal Cannula  Additional Equipment: None  Intra-op Plan:   Post-operative Plan:   Informed Consent: I have reviewed the patients History and Physical, chart, labs and discussed the procedure including the risks, benefits and alternatives for the proposed anesthesia with the patient or authorized representative who has indicated his/her understanding and acceptance.       Plan Discussed with: CRNA  Anesthesia Plan Comments:        Anesthesia Quick Evaluation

## 2023-01-11 NOTE — Anesthesia Procedure Notes (Signed)
Procedure Name: MAC Date/Time: 01/11/2023 10:13 AM  Performed by: Elyn Peers, CRNAPre-anesthesia Checklist: Patient identified, Emergency Drugs available, Suction available, Patient being monitored and Timeout performed Oxygen Delivery Method: Simple face mask Placement Confirmation: positive ETCO2

## 2023-01-11 NOTE — Op Note (Signed)
[facility] Patient Name: Billy [name] Procedure Date: 01/11/2023 MRN: [id number] Attending MD: Meryl Dare , MD, [phone] Date of Birth: Jan 18, 1966 CSN: [id number] Age: 57 Admit Type: Outpatient Procedure:                Colonoscopy Indications:              Positive Cologuard test Providers:                Venita Lick. Russella Dar, MD, Marge Duncans, RN, Beryle Beams, Technician, Geoffery Lyons, Technician Referring MD:             Mliss Sax, MD Medicines:                Monitored Anesthesia Care Complications:            No immediate complications. Estimated blood loss:                            None. Estimated Blood Loss:     Estimated blood loss: none. Procedure:                Pre-Anesthesia Assessment:                           - Prior to the procedure, a History and Physical                            was performed, and patient medications and                            allergies were reviewed. The patient's tolerance of                            previous anesthesia was also reviewed. The risks                            and benefits of the procedure and the sedation                            options and risks were discussed with the patient.                            All questions were answered, and informed consent                            was obtained. Prior Anticoagulants: The patient has                            taken Plavix (clopidogrel), last dose was 5 days                            prior to procedure and Eliquis last dose 2 days  prior to the procedure. ASA Grade Assessment: III -                            A patient with severe systemic disease. After                            reviewing the risks and benefits, the patient was                            deemed in satisfactory condition to undergo the                            procedure.                           After  obtaining informed consent, the colonoscope                            was passed under direct vision. Throughout the                            procedure, the patient's blood pressure, pulse, and                            oxygen saturations were monitored continuously. The                            CF-HQ190L ([id number]) Olympus colonoscope was                            introduced through the anus and advanced to the the                            cecum, identified by appendiceal orifice and                            ileocecal valve. The ileocecal valve, appendiceal                            orifice, and rectum were photographed. The quality                            of the bowel preparation was adequate after                            significant lavage, suction. The colonoscopy was                            performed without difficulty. The patient tolerated                            the procedure well. Scope In: 10:18:00 AM Scope Out: 10:48:53 AM Scope Withdrawal Time: 0 hours 28 minutes 17 seconds  Total Procedure Duration: 0 hours 30 minutes 53 seconds  Findings:      The perianal and digital rectal examinations were normal.      Seven sessile and semi-pedunculated polyps were found in the sigmoid       colon, descending colon, transverse colon (4) and ascending colon. The       polyps were 6 to 8 mm in size. These polyps were removed with a cold       snare. Resection and retrieval were complete. To prevent bleeding after       the polypectomy, six hemostatic clips were successfully placed (MR       conditional). Clip manufacturer: [employer]. There was no       bleeding at the end of the procedure. One of the transverse colon polyp       sites was not clipped as significant spasm following polypectomy       obscured the site. No immediate bleeding was noted.      Multiple medium-mouthed and small-mouthed diverticula were found in the       left colon. There was no  evidence of diverticular bleeding.      Internal hemorrhoids were found during retroflexion. The hemorrhoids       were moderate and Grade I (internal hemorrhoids that do not prolapse).      The exam was otherwise without abnormality on direct and retroflexion       views. Impression:               - Seven 6 to 8 mm polyps in the sigmoid colon, in                            the descending colon, in the transverse colon and                            in the ascending colon, removed with a cold snare.                            Resected and retrieved. Clips (MR conditional) were                            placed on 6 sites. Clip manufacturer: [employer].                           - Mild diverticulosis in the left colon.                           - Internal hemorrhoids.                           - The examination was otherwise normal on direct                            and retroflexion views. Moderate Sedation:      Not Applicable - Patient had care per Anesthesia. Recommendation:           - Repeat colonoscopy date to be determined after  pending pathology results are reviewed for                            surveillance based on pathology results with a more                            extensive bowel prep.                           - Resume Plavix (clopidogrel) in 2 days at prior                            dose and Eliquis in 2 days at prior dose. Refer to                            managing physician for further adjustment of                            therapy.                           - Patient has a contact number available for                            emergencies. The signs and symptoms of potential                            delayed complications were discussed with the                            patient. Return to normal activities tomorrow.                            Written discharge instructions were provided to the                             patient.                           - Resume previous diet.                           - Continue present medications with high fiber                            added.                           - Await pathology results.                           - No aspirin, ibuprofen, naproxen, or other                            non-steroidal anti-inflammatory drugs for 2 weeks  after polyp removal. Procedure Code(s):        --- Professional ---                           [phone], Colonoscopy, flexible; with removal of                            tumor(s), polyp(s), or other lesion(s) by snare                            technique Diagnosis Code(s):        --- Professional ---                           D12.5, Benign neoplasm of sigmoid colon                           D12.4, Benign neoplasm of descending colon                           D12.3, Benign neoplasm of transverse colon (hepatic                            flexure or splenic flexure)                           D12.2, Benign neoplasm of ascending colon                           K64.0, First degree hemorrhoids                           R19.5, Other fecal abnormalities                           K57.30, Diverticulosis of large intestine without                            perforation or abscess without bleeding CPT copyright 2022 [employer]. All rights reserved. The codes documented in this report are preliminary and upon coder review may  be revised to meet current compliance requirements. Meryl Dare, MD 01/11/2023 11:00:34 AM This report has been signed electronically. Number of Addenda: 0

## 2023-01-11 NOTE — Discharge Instructions (Signed)

## 2023-01-11 NOTE — Transfer of Care (Signed)
Immediate Anesthesia Transfer of Care Note  Patient: Billy [name]  Procedure(s) Performed: COLONOSCOPY WITH PROPOFOL POLYPECTOMY HEMOSTASIS CLIP PLACEMENT  Patient Location: PACU and Endoscopy Unit  Anesthesia Type:MAC  Level of Consciousness: awake, alert , oriented, and patient cooperative  Airway & Oxygen Therapy: Patient Spontanous Breathing and Patient connected to face mask oxygen  Post-op Assessment: Report given to RN and Post -op Vital signs reviewed and stable  Post vital signs: Reviewed and stable  Last Vitals:  Vitals Value Taken Time  BP 99/61 01/11/23 1055  Temp    Pulse 38 01/11/23 1056  Resp 20 01/11/23 1056  SpO2 100 % 01/11/23 1056  Vitals shown include unfiled device data.  Last Pain:  Vitals:   01/11/23 0845  TempSrc: Temporal  PainSc: 0-No pain         Complications: No notable events documented.

## 2023-01-11 NOTE — Interval H&P Note (Signed)
History and Physical Interval Note:  01/11/2023 10:05 AM  Billy [name]  has presented today for surgery, with the diagnosis of Positive cologard.  The various methods of treatment have been discussed with the patient and family. After consideration of risks, benefits and other options for treatment, the patient has consented to  Procedure(s): COLONOSCOPY WITH PROPOFOL (N/A) as a surgical intervention.  The patient's history has been reviewed, patient examined, no change in status, stable for surgery.  I have reviewed the patient's chart and labs.  Questions were answered to the patient's satisfaction.     Venita Lick. Russella Dar

## 2023-01-11 NOTE — Anesthesia Postprocedure Evaluation (Signed)
Anesthesia Post Note  Patient: Billy [name]  Procedure(s) Performed: COLONOSCOPY WITH PROPOFOL POLYPECTOMY HEMOSTASIS CLIP PLACEMENT     Patient location during evaluation: PACU Anesthesia Type: MAC Level of consciousness: awake and alert Pain management: pain level controlled Vital Signs Assessment: post-procedure vital signs reviewed and stable Respiratory status: spontaneous breathing, nonlabored ventilation, respiratory function stable and patient connected to nasal cannula oxygen Cardiovascular status: stable and blood pressure returned to baseline Postop Assessment: no apparent nausea or vomiting Anesthetic complications: no  No notable events documented.  Last Vitals:  Vitals:   01/11/23 1110 01/11/23 1120  BP: 100/77 117/80  Pulse: (!) 32 (!) 52  Resp: 15 19  Temp:    SpO2: 96% 95%    Last Pain:  Vitals:   01/11/23 1120  TempSrc:   PainSc: 0-No pain                 Shelton Silvas

## 2023-01-12 LAB — SURGICAL PATHOLOGY

## 2023-01-13 ENCOUNTER — Encounter: Payer: Self-pay | Admitting: Gastroenterology

## 2023-01-14 ENCOUNTER — Encounter (HOSPITAL_COMMUNITY): Payer: Self-pay | Admitting: Gastroenterology

## 2023-02-08 ENCOUNTER — Ambulatory Visit: Payer: Medicare HMO | Admitting: Family Medicine

## 2023-02-08 VITALS — BP 126/80 | HR 86 | Temp 97.8°F | Wt 266.0 lb

## 2023-02-08 DIAGNOSIS — Z87891 Personal history of nicotine dependence: Secondary | ICD-10-CM | POA: Diagnosis not present

## 2023-02-08 DIAGNOSIS — H52209 Unspecified astigmatism, unspecified eye: Secondary | ICD-10-CM | POA: Diagnosis not present

## 2023-02-08 DIAGNOSIS — R399 Unspecified symptoms and signs involving the genitourinary system: Secondary | ICD-10-CM | POA: Diagnosis not present

## 2023-02-08 DIAGNOSIS — J302 Other seasonal allergic rhinitis: Secondary | ICD-10-CM | POA: Diagnosis not present

## 2023-02-08 DIAGNOSIS — H5203 Hypermetropia, bilateral: Secondary | ICD-10-CM | POA: Diagnosis not present

## 2023-02-08 DIAGNOSIS — K219 Gastro-esophageal reflux disease without esophagitis: Secondary | ICD-10-CM | POA: Diagnosis not present

## 2023-02-08 LAB — POC URINALSYSI DIPSTICK (AUTOMATED)
Bilirubin, UA: NEGATIVE
Glucose, UA: POSITIVE — AB
Ketones, UA: NEGATIVE
Leukocytes, UA: NEGATIVE
Nitrite, UA: NEGATIVE
Protein, UA: NEGATIVE
Spec Grav, UA: 1.015 (ref 1.010–1.025)
Urobilinogen, UA: 0.2 U/dL
pH, UA: 6 (ref 5.0–8.0)

## 2023-02-08 LAB — URINALYSIS, ROUTINE W REFLEX MICROSCOPIC
Bilirubin Urine: NEGATIVE
Ketones, ur: NEGATIVE
Leukocytes,Ua: NEGATIVE
Nitrite: NEGATIVE
Specific Gravity, Urine: 1.015 (ref 1.000–1.030)
Total Protein, Urine: NEGATIVE
Urine Glucose: >=1000 — AB
Urobilinogen, UA: 0.2 (ref 0.0–1.0)
pH: 5.5 (ref 5.0–8.0)

## 2023-02-08 MED ORDER — PANTOPRAZOLE SODIUM 40 MG PO TBEC
40.0000 mg | DELAYED_RELEASE_TABLET | Freq: Every day | ORAL | 3 refills | Status: DC
Start: 2023-02-08 — End: 2023-10-09

## 2023-02-08 MED ORDER — FLUTICASONE PROPIONATE 50 MCG/ACT NA SUSP
2.0000 | Freq: Every day | NASAL | 11 refills | Status: DC | PRN
Start: 2023-02-08 — End: 2023-06-08

## 2023-02-08 NOTE — Progress Notes (Signed)
Established Patient Office Visit   Subjective:  Patient ID: Billy [name], male    DOB: 1966-02-03  Age: 57 y.o. MRN: [id number]  Chief Complaint  Patient presents with   Back Pain    Lower back pain for 2 weeks     Back Pain Pertinent negatives include no abdominal pain, dysuria, tingling or weakness.   Encounter Diagnoses  Name Primary?   UTI symptoms Yes   Seasonal allergies    Gastroesophageal reflux disease, unspecified whether esophagitis present    History of tobacco use    2-week history of nonradiating lower back pain.  No injury history.  There has been some increased frequency of urination.  Denies urgency dysuria or discharge.  He denies hesitancy or decreased force of stream.  He is accompanied by his wife.  Continues Flonase for sneezing postnasal drip year-round.  It is quite helpful for him.  Continues Protonix for GERD symptoms with good result.   Review of Systems  Constitutional: Negative.   HENT: Negative.    Eyes:  Negative for blurred vision, discharge and redness.  Respiratory: Negative.    Cardiovascular: Negative.   Gastrointestinal:  Negative for abdominal pain, blood in stool, constipation, heartburn and melena.  Genitourinary:  Positive for frequency. Negative for dysuria, flank pain, hematuria and urgency.  Musculoskeletal:  Positive for back pain. Negative for myalgias.  Skin:  Negative for rash.  Neurological:  Negative for tingling, loss of consciousness and weakness.  Endo/Heme/Allergies:  Negative for polydipsia.     Current Outpatient Medications:    alum & mag hydroxide-simeth (MAALOX MAX) 400-400-40 MG/5ML suspension, Take 10 mLs by mouth every 6 (six) hours as needed for indigestion., Disp: 355 mL, Rfl: 0   apixaban (ELIQUIS) 5 MG TABS tablet, TAKE 1 TABLET TWICE DAILY, Disp: 180 tablet, Rfl: 1   atorvastatin (LIPITOR) 80 MG tablet, TAKE 1 TABLET EVERY DAY, Disp: 90 tablet, Rfl: 2   carvedilol (COREG) 3.125 MG tablet, TAKE 1 TABLET  BY MOUTH TWICE A DAY WITH A MEAL, Disp: 180 tablet, Rfl: 3   clopidogrel (PLAVIX) 75 MG tablet, Take 1 tablet (75 mg total) by mouth daily., Disp: 90 tablet, Rfl: 3   empagliflozin (JARDIANCE) 10 MG TABS tablet, Take 1 tablet (10 mg total) by mouth daily., Disp: 90 tablet, Rfl: 3   ezetimibe (ZETIA) 10 MG tablet, TAKE 1 TABLET EVERY DAY, Disp: 90 tablet, Rfl: 3   famotidine (PEPCID) 40 MG tablet, Take 1 tablet (40 mg total) by mouth at bedtime., Disp: 30 tablet, Rfl: 11   furosemide (LASIX) 40 MG tablet, TAKE 1 TABLET EVERY DAY, Disp: 90 tablet, Rfl: 3   Iron, Ferrous Sulfate, 325 (65 Fe) MG TABS, Take 325 mg by mouth daily., Disp: 270 tablet, Rfl: 1   isosorbide mononitrate (IMDUR) 30 MG 24 hr tablet, Take 0.5 tablets (15 mg total) by mouth daily., Disp: 90 tablet, Rfl: 3   levothyroxine (SYNTHROID) 50 MCG tablet, TAKE 1 TABLET BY MOUTH DAILY BEFORE BREAKFAST. 30 MINUTES BEFORE HAVING BREAKFAST., Disp: 90 tablet, Rfl: 0   montelukast (SINGULAIR) 10 MG tablet, TAKE 1 TABLET EVERY DAY AS NEEDED FOR ALLERGIES, Disp: 90 tablet, Rfl: 1   nitroGLYCERIN (NITROSTAT) 0.4 MG SL tablet, DISSOLVE 1 TABLET UNDER THE TONGUE EVERY 5 MINUTES FOR 3 DOSES AS NEEDED FOR CHEST PAIN, Disp: 25 tablet, Rfl: 11   polyethylene glycol (MIRALAX / GLYCOLAX) 17 g packet, Take 17 g by mouth daily., Disp: 14 each, Rfl: 0   potassium chloride SA (KLOR-CON  M) 20 MEQ tablet, Take 1 tablet (20 mEq total) by mouth daily., Disp: 90 tablet, Rfl: 2   sacubitril-valsartan (ENTRESTO) 24-26 MG, TAKE 1 TABLET TWICE DAILY, Disp: 180 tablet, Rfl: 2   fluticasone (FLONASE) 50 MCG/ACT nasal spray, Place 2 sprays into both nostrils daily as needed for allergies or rhinitis., Disp: 16 g, Rfl: 11   pantoprazole (PROTONIX) 40 MG tablet, Take 1 tablet (40 mg total) by mouth daily., Disp: 90 tablet, Rfl: 3   Objective:     BP 126/80   Pulse 86   Temp 97.8 F (36.6 C) (Temporal)   Wt 266 lb (120.7 kg)   SpO2 94%   BMI 37.10 kg/m     Physical Exam Constitutional:      General: He is not in acute distress.    Appearance: Normal appearance. He is not ill-appearing, toxic-appearing or diaphoretic.  HENT:     Head: Normocephalic and atraumatic.     Right Ear: External ear normal.     Left Ear: External ear normal.     Mouth/Throat:     Mouth: Mucous membranes are moist.     Pharynx: Oropharynx is clear. No oropharyngeal exudate or posterior oropharyngeal erythema.  Eyes:     General: No scleral icterus.       Right eye: No discharge.        Left eye: No discharge.     Extraocular Movements: Extraocular movements intact.     Conjunctiva/sclera: Conjunctivae normal.     Pupils: Pupils are equal, round, and reactive to light.  Cardiovascular:     Rate and Rhythm: Normal rate and regular rhythm.  Pulmonary:     Effort: Pulmonary effort is normal. No respiratory distress.     Breath sounds: Normal breath sounds. No wheezing, rhonchi or rales.  Abdominal:     General: Bowel sounds are normal.     Tenderness: There is no abdominal tenderness. There is no guarding or rebound.  Musculoskeletal:     Cervical back: No rigidity or tenderness.     Lumbar back: No bony tenderness. Normal range of motion.  Skin:    General: Skin is warm and dry.  Neurological:     Mental Status: He is alert and oriented to person, place, and time.     Motor: No weakness.  Psychiatric:        Mood and Affect: Mood normal.        Behavior: Behavior normal.      Results for orders placed or performed in visit on 02/08/23  POCT Urinalysis Dipstick (Automated)  Result Value Ref Range   Color, UA yellow    Clarity, UA clear    Glucose, UA Positive (A) Negative   Bilirubin, UA negative    Ketones, UA negative    Spec Grav, UA 1.015 1.010 - 1.025   Blood, UA +-    pH, UA 6.0 5.0 - 8.0   Protein, UA Negative Negative   Urobilinogen, UA 0.2 0.2 or 1.0 E.U./dL   Nitrite, UA negative    Leukocytes, UA Negative Negative      The  ASCVD Risk score (Arnett DK, et al., 2019) failed to calculate for the following reasons:   The patient has a prior MI or stroke diagnosis    Assessment & Plan:   UTI symptoms -     POCT Urinalysis Dipstick (Automated) -     Urinalysis, Routine w reflex microscopic  Seasonal allergies -     Fluticasone Propionate; Place  2 sprays into both nostrils daily as needed for allergies or rhinitis.  Dispense: 16 g; Refill: 11  Gastroesophageal reflux disease, unspecified whether esophagitis present -     Pantoprazole Sodium; Take 1 tablet (40 mg total) by mouth daily.  Dispense: 90 tablet; Refill: 3  History of tobacco use -     Ambulatory Referral for Lung Cancer Scre    Return if symptoms worsen or fail to improve.    Mliss Sax, MD

## 2023-02-18 ENCOUNTER — Other Ambulatory Visit: Payer: Self-pay | Admitting: Internal Medicine

## 2023-02-18 DIAGNOSIS — Z951 Presence of aortocoronary bypass graft: Secondary | ICD-10-CM

## 2023-02-19 ENCOUNTER — Telehealth: Payer: Self-pay | Admitting: Family Medicine

## 2023-02-19 ENCOUNTER — Ambulatory Visit: Payer: Medicare HMO | Admitting: Nurse Practitioner

## 2023-02-19 NOTE — Telephone Encounter (Signed)
Called and spoke to patients wife.  Notified of Recommendation and no further questions. Dm/cma

## 2023-02-19 NOTE — Telephone Encounter (Signed)
Pts wife Steward Drone called about pts urine test. They are concerned due to they see abnormal.  Please call  [phone]

## 2023-03-10 ENCOUNTER — Other Ambulatory Visit: Payer: Self-pay | Admitting: Family Medicine

## 2023-03-10 DIAGNOSIS — E039 Hypothyroidism, unspecified: Secondary | ICD-10-CM

## 2023-03-19 ENCOUNTER — Ambulatory Visit: Payer: Medicare HMO

## 2023-03-19 DIAGNOSIS — I4901 Ventricular fibrillation: Secondary | ICD-10-CM | POA: Diagnosis not present

## 2023-03-21 LAB — CUP PACEART REMOTE DEVICE CHECK
Battery Remaining Longevity: 107 mo
Battery Remaining Percentage: 86 %
Battery Voltage: 3.01 V
Brady Statistic RV Percent Paced: 1 %
Date Time Interrogation Session: 20241218112555
HighPow Impedance: 59 Ohm
Implantable Lead Connection Status: 753985
Implantable Lead Implant Date: 20101111
Implantable Lead Location: 753860
Implantable Lead Model: 7121
Implantable Pulse Generator Implant Date: 20230317
Lead Channel Impedance Value: 550 Ohm
Lead Channel Pacing Threshold Amplitude: 1 V
Lead Channel Pacing Threshold Pulse Width: 0.5 ms
Lead Channel Sensing Intrinsic Amplitude: 12 mV
Lead Channel Setting Pacing Amplitude: 2.5 V
Lead Channel Setting Pacing Pulse Width: 0.5 ms
Lead Channel Setting Sensing Sensitivity: 0.5 mV
Pulse Gen Serial Number: 210001302
Zone Setting Status: 755011

## 2023-03-22 ENCOUNTER — Inpatient Hospital Stay: Payer: Medicare HMO | Attending: Nurse Practitioner

## 2023-04-09 ENCOUNTER — Ambulatory Visit: Payer: Medicare HMO | Admitting: Family Medicine

## 2023-04-11 ENCOUNTER — Other Ambulatory Visit: Payer: Self-pay | Admitting: Cardiology

## 2023-04-19 ENCOUNTER — Ambulatory Visit: Payer: Medicare HMO | Admitting: Nurse Practitioner

## 2023-04-25 NOTE — Progress Notes (Signed)
Remote ICD transmission.   

## 2023-04-25 NOTE — Addendum Note (Signed)
Addended by: Geralyn Flash D on: 04/25/2023 01:38 PM   Modules accepted: Orders

## 2023-04-30 ENCOUNTER — Ambulatory Visit (INDEPENDENT_AMBULATORY_CARE_PROVIDER_SITE_OTHER): Payer: Medicare HMO

## 2023-04-30 DIAGNOSIS — Z0000 Encounter for general adult medical examination without abnormal findings: Secondary | ICD-10-CM

## 2023-04-30 NOTE — Progress Notes (Signed)
Subjective:   Billy [name] is a 58 y.o. male who presents for Medicare Annual/Subsequent preventive examination.  Visit Complete: Virtual I connected with  Trey Sailors on 04/30/23 by a audio enabled telemedicine application and verified that I am speaking with the correct person using two identifiers.  Interactive audio and video telecommunications were attempted between this provider and patient, however failed, due to patient having technical difficulties OR patient did not have access to video capability.  We continued and completed visit with audio only.  Patient Location: Home  Provider Location: Office/Clinic  I discussed the limitations of evaluation and management by telemedicine. The patient expressed understanding and agreed to proceed.  Vital Signs: Because this visit was a virtual/telehealth visit, some criteria may be missing or patient reported. Any vitals not documented were not able to be obtained and vitals that have been documented are patient reported.    Cardiac Risk Factors include: advanced age (>70men, >51 women);hypertension;male gender     Objective:    Today's Vitals   There is no height or weight on file to calculate BMI.     04/30/2023    8:51 AM 01/11/2023    8:41 AM 06/10/2022    9:47 AM 04/24/2022   11:27 AM 09/27/2021   10:48 AM 08/29/2021    7:24 PM 07/02/2021    6:00 AM  Advanced Directives  Does Patient Have a Medical Advance Directive? No No No No No No No  Would patient like information on creating a medical advance directive? No - Patient declined No - Patient declined No - Patient declined No - Patient declined No - Patient declined No - Patient declined No - Patient declined    Current Medications (verified) Outpatient Encounter Medications as of 04/30/2023  Medication Sig   alum & mag hydroxide-simeth (MAALOX MAX) 400-400-40 MG/5ML suspension Take 10 mLs by mouth every 6 (six) hours as needed for indigestion.   apixaban (ELIQUIS) 5  MG TABS tablet TAKE 1 TABLET TWICE DAILY   atorvastatin (LIPITOR) 80 MG tablet TAKE 1 TABLET EVERY DAY   carvedilol (COREG) 3.125 MG tablet TAKE 1 TABLET BY MOUTH TWICE A DAY WITH A MEAL   clopidogrel (PLAVIX) 75 MG tablet Take 1 tablet (75 mg total) by mouth daily.   empagliflozin (JARDIANCE) 10 MG TABS tablet Take 1 tablet (10 mg total) by mouth daily.   ENTRESTO 24-26 MG TAKE 1 TABLET TWICE DAILY   ezetimibe (ZETIA) 10 MG tablet TAKE 1 TABLET EVERY DAY   famotidine (PEPCID) 40 MG tablet Take 1 tablet (40 mg total) by mouth at bedtime.   fluticasone (FLONASE) 50 MCG/ACT nasal spray Place 2 sprays into both nostrils daily as needed for allergies or rhinitis.   furosemide (LASIX) 40 MG tablet TAKE 1 TABLET EVERY DAY   isosorbide mononitrate (IMDUR) 30 MG 24 hr tablet Take 0.5 tablets (15 mg total) by mouth daily.   levothyroxine (SYNTHROID) 50 MCG tablet TAKE 1 TABLET BY MOUTH DAILY BEFORE BREAKFAST. 30 MINUTES BEFORE HAVING BREAKFAST.   montelukast (SINGULAIR) 10 MG tablet TAKE 1 TABLET EVERY DAY AS NEEDED FOR ALLERGIES   nitroGLYCERIN (NITROSTAT) 0.4 MG SL tablet DISSOLVE 1 TABLET UNDER THE TONGUE EVERY 5 MINUTES FOR 3 DOSES AS NEEDED FOR CHEST PAIN   pantoprazole (PROTONIX) 40 MG tablet Take 1 tablet (40 mg total) by mouth daily.   polyethylene glycol (MIRALAX / GLYCOLAX) 17 g packet Take 17 g by mouth daily.   potassium chloride SA (KLOR-CON M) 20 MEQ tablet Take  1 tablet (20 mEq total) by mouth daily.   Iron, Ferrous Sulfate, 325 (65 Fe) MG TABS Take 325 mg by mouth daily. (Patient not taking: Reported on 04/30/2023)   No facility-administered encounter medications on file as of 04/30/2023.    Allergies (verified) Patient has no known allergies.   History: Past Medical History:  Diagnosis Date   Aortic atherosclerosis ([name])    on CXR   Blood in stool    C. difficile colitis    a. remote hx 2010.   Cardiac arrest - ventricular fibrillation 02/11/2009   a. 02/2009 s/p St Jude ICD.    Chronic combined systolic and diastolic CHF (congestive heart failure) ([name])    Chronic kidney disease, stage 3a ([name])    Coronary artery disease    a. PCI to Cx age 64. b. CABGx4 in 2003. c. BMS to OM1 in 12/2007. d. PTCA to Cx 01/2019.   Former tobacco use    Hyperlipidemia    Hypertension    Ischemic cardiomyopathy    Marijuana abuse    Myocardial infarct ([name])    NON-ST-SEGMENT ELEVATION MI   Obesity    PAD (peripheral artery disease) ([name])    PAF (paroxysmal atrial fibrillation) ([name])    Pathologic fracture of left acetabulum 07/03/2021   PSVT (paroxysmal supraventricular tachycardia) ([name])    Ventricular tachycardia ([name])    Past Surgical History:  Procedure Laterality Date   ABDOMINAL AORTOGRAM W/LOWER EXTREMITY N/A 05/14/2019   Procedure: ABDOMINAL AORTOGRAM W/LOWER EXTREMITY;  Surgeon: Iran Ouch, MD;  Location: [facility] INVASIVE CV LAB;  Service: Cardiovascular;  Laterality: N/A;   CARDIAC DEFIBRILLATOR PLACEMENT     STJ single chamber ICD implanted for secondary prevention   CIRCUMCISION     COLONOSCOPY WITH PROPOFOL N/A 01/11/2023   Procedure: COLONOSCOPY WITH PROPOFOL;  Surgeon: Meryl Dare, MD;  Location: [facility];  Service: Gastroenterology;  Laterality: N/A;   CORONARY ARTERY BYPASS GRAFT  06/17/01   X 4   CORONARY BALLOON ANGIOPLASTY N/A 01/22/2019   Procedure: CORONARY BALLOON ANGIOPLASTY;  Surgeon: Marykay Lex, MD;  Location: [facility] INVASIVE CV LAB;  Service: Cardiovascular;  Laterality: N/A;   HEMOSTASIS CLIP PLACEMENT  01/11/2023   Procedure: HEMOSTASIS CLIP PLACEMENT;  Surgeon: Meryl Dare, MD;  Location: [facility];  Service: Gastroenterology;;   HIP CLOSED REDUCTION Left 07/02/2021   Procedure: CLOSED REDUCTION HIP with placement of skeletal traction;  Surgeon: Jones Broom, MD;  Location: [facility] OR;  Service: Orthopedics;  Laterality: Left;   ICD GENERATOR CHANGEOUT N/A 06/17/2021   Procedure: ICD GENERATOR CHANGEOUT;  Surgeon: Duke Salvia, MD;  Location: [facility] INVASIVE CV LAB;  Service: Cardiovascular;  Laterality: N/A;   LEFT HEART CATH AND CORS/GRAFTS ANGIOGRAPHY N/A 01/22/2019   Procedure: LEFT HEART CATH AND CORS/GRAFTS ANGIOGRAPHY;  Surgeon: Marykay Lex, MD;  Location: [facility] INVASIVE CV LAB;  Service: Cardiovascular;  Laterality: N/A;   LEFT HEART CATH AND CORS/GRAFTS ANGIOGRAPHY N/A 05/18/2020   Procedure: LEFT HEART CATH AND CORS/GRAFTS ANGIOGRAPHY;  Surgeon: Lennette Bihari, MD;  Location: [facility] INVASIVE CV LAB;  Service: Cardiovascular;  Laterality: N/A;   OPEN REDUCTION INTERNAL FIXATION ACETABULUM FRACTURE POSTERIOR Left 07/04/2021   Procedure: OPEN REDUCTION INTERNAL FIXATION ACETABULUM FRACTURE POSTERIOR;  Surgeon: Myrene Galas, MD;  Location: [facility] OR;  Service: Orthopedics;  Laterality: Left;   PERIPHERAL VASCULAR INTERVENTION Right 05/14/2019   Procedure: PERIPHERAL VASCULAR INTERVENTION;  Surgeon: Iran Ouch, MD;  Location: [facility];  Service: Cardiovascular;  Laterality:  Right;   POLYPECTOMY  01/11/2023   Procedure: POLYPECTOMY;  Surgeon: Meryl Dare, MD;  Location: [facility];  Service: Gastroenterology;;   SVT ABLATION N/A 06/21/2020   Procedure: SVT ABLATION;  Surgeon: Marinus Maw, MD;  Location: [facility] INVASIVE CV LAB;  Service: Cardiovascular;  Laterality: N/A;   Family History  Problem Relation Age of Onset   Hypertension Mother    Heart attack Father        DIED AT 31 FROM MI   Heart disease Sister        CAD AND PREVIOUS CABG   Cancer Paternal Uncle        throat   Cancer Paternal Uncle        leukemia   Hypertension Other        IN MOST OF HIS SIBLINGS   Social History   Socioeconomic History   Marital status: Married    Spouse name: Not on file   Number of children: 3   Years of education: 11   Highest education level: 11th grade  Occupational History   Occupation: Disability  Tobacco Use   Smoking status: Former    Current packs/day: 0.00    Average packs/day: 2.0  packs/day for 20.0 years (40.0 ttl pk-yrs)    Types: Cigarettes    Start date: 2001    Quit date: 2021    Years since quitting: 4.0    Passive exposure: Never   Smokeless tobacco: Never  Vaping Use   Vaping status: Never Used  Substance and Sexual Activity   Alcohol use: No   Drug use: No   Sexual activity: Yes    Partners: Female  Other Topics Concern   Not on file  Social History Narrative   MARRIED   FORMER TOBACCO USE, SMOKED FOR 20 YRS 2 PPD quit 2021   NO ETOH   NO ILLICIT DRUG USE   NO REGULAR EXERCISE         ICD-ST. JUDE ; CERTIFIED LETTER SENT AND RETURNED BAD ADDRESS, PLS UPDATE DJW.      Fun: Fishing    Social Drivers of Corporate investment banker Strain: Low Risk  (04/30/2023)   Overall Financial Resource Strain (CARDIA)    Difficulty of Paying Living Expenses: Not hard at all  Recent Concern: Financial Resource Strain - Medium Risk (02/08/2023)   Overall Financial Resource Strain (CARDIA)    Difficulty of Paying Living Expenses: Somewhat hard  Food Insecurity: No Food Insecurity (04/30/2023)   Hunger Vital Sign    Worried About Running Out of Food in the Last Year: Never true    Ran Out of Food in the Last Year: Never true  Recent Concern: Food Insecurity - Food Insecurity Present (02/08/2023)   Hunger Vital Sign    Worried About Running Out of Food in the Last Year: Sometimes true    Ran Out of Food in the Last Year: Sometimes true  Transportation Needs: No Transportation Needs (04/30/2023)   PRAPARE - Administrator, Civil Service (Medical): No    Lack of Transportation (Non-Medical): No  Physical Activity: Inactive (04/30/2023)   Exercise Vital Sign    Days of Exercise per Week: 0 days    Minutes of Exercise per Session: 0 min  Stress: No Stress Concern Present (04/30/2023)   [employer] of Occupational Health - Occupational Stress Questionnaire    Feeling of Stress : Not at all  Social Connections: Moderately Integrated (04/30/2023)    Social Connection  and Isolation Panel [NHANES]    Frequency of Communication with Friends and Family: Once a week    Frequency of Social Gatherings with Friends and Family: More than three times a week    Attends Religious Services: More than 4 times per year    Active Member of [employer] or Organizations: No    Attends Engineer, structural: Never    Marital Status: Married    Tobacco Counseling Counseling given: Not Answered   Clinical Intake:  Pre-visit preparation completed: Yes  Pain : No/denies pain     Nutritional Risks: None Diabetes: No  How often do you need to have someone help you when you read instructions, pamphlets, or other written materials from your doctor or pharmacy?: 1 - Never  Interpreter Needed?: No  Information entered by :: NAllen LPN   Activities of Daily Living    04/30/2023    8:46 AM  In your present state of health, do you have any difficulty performing the following activities:  Hearing? 0  Vision? 0  Difficulty concentrating or making decisions? 0  Walking or climbing stairs? 0  Dressing or bathing? 0  Doing errands, shopping? 0  Preparing Food and eating ? N  Using the Toilet? N  In the past six months, have you accidently leaked urine? N  Do you have problems with loss of bowel control? N  Managing your Medications? N  Managing your Finances? N  Housekeeping or managing your Housekeeping? N    Patient Care Team: Mliss Sax, MD as PCP - General (Family Medicine) Jens Som Madolyn Frieze, MD as PCP - Cardiology (Cardiology) Duke Salvia, MD as PCP - Electrophysiology (Cardiology) Janalyn Harder, MD (Inactive) as Consulting Physician (Dermatology) Pollyann Samples, NP as Nurse Practitioner (Hematology and Oncology)  Indicate any recent Medical Services you may have received from other than [facility] providers in the past year (date may be approximate).     Assessment:   This is a routine wellness examination for  Serge.  Hearing/Vision screen Hearing Screening - Comments:: Denies hearing issues Vision Screening - Comments:: Regular eye exams, [facility]   Goals Addressed             This Visit's Progress    Patient Stated       04/30/2023, wants to lose weight       Depression Screen    04/30/2023    8:52 AM 07/12/2022   11:00 AM 04/24/2022   11:24 AM 08/17/2021   10:13 AM 04/13/2021    3:10 PM 04/13/2021    3:07 PM 03/01/2021    9:02 AM  PHQ 2/9 Scores  PHQ - 2 Score 0 0 0 0 0 0 0    Fall Risk    04/30/2023    8:52 AM 02/08/2023   10:23 AM 10/06/2022   11:00 AM 07/12/2022   11:00 AM 04/24/2022   11:20 AM  Fall Risk   Falls in the past year? 0 0 0 0 1  Number falls in past yr: 0 0 0 0 0  Injury with Fall? 0 0 0 0 0  Risk for fall due to : Medication side effect No Fall Risks No Fall Risks No Fall Risks No Fall Risks  Follow up Falls prevention discussed;Falls evaluation completed Falls evaluation completed Falls evaluation completed Falls evaluation completed Education provided;Falls prevention discussed    MEDICARE RISK AT HOME: Medicare Risk at Home Any stairs in or around the home?: No If so, are there any  without handrails?: No Home free of loose throw rugs in walkways, pet beds, electrical cords, etc?: Yes Adequate lighting in your home to reduce risk of falls?: Yes Life alert?: No Use of a cane, walker or w/c?: No Grab bars in the bathroom?: No Shower chair or bench in shower?: Yes Elevated toilet seat or a handicapped toilet?: No  TIMED UP AND GO:  Was the test performed?  No    Cognitive Function:        04/30/2023    8:53 AM 04/24/2022   11:32 AM  6CIT Screen  What Year? 0 points 0 points  What month? 0 points 0 points  What time? 3 points 0 points  Count back from 20 0 points 0 points  Months in reverse 4 points 0 points  Repeat phrase 2 points 0 points  Total Score 9 points 0 points    Immunizations Immunization History  Administered Date(s)  Administered   Influenza,inj,Quad PF,6+ Mos 12/10/2013, 03/01/2021   Moderna Sars-Covid-2 Vaccination 10/11/2019, 11/08/2019   PNEUMOCOCCAL CONJUGATE-20 03/01/2021   Tdap 11/02/2014    TDAP status: Up to date  Flu Vaccine status: Due, Education has been provided regarding the importance of this vaccine. Advised may receive this vaccine at local pharmacy or Health Dept. Aware to provide a copy of the vaccination record if obtained from local pharmacy or Health Dept. Verbalized acceptance and understanding.  Pneumococcal vaccine status: Up to date  Covid-19 vaccine status: Information provided on how to obtain vaccines.   Qualifies for Shingles Vaccine? Yes   Zostavax completed No   Shingrix Completed?: No.    Education has been provided regarding the importance of this vaccine. Patient has been advised to call insurance company to determine out of pocket expense if they have not yet received this vaccine. Advised may also receive vaccine at local pharmacy or Health Dept. Verbalized acceptance and understanding.  Screening Tests Health Maintenance  Topic Date Due   OPHTHALMOLOGY EXAM  Never done   Zoster Vaccines- Shingrix (1 of 2) Never done   Lung Cancer Screening  Never done   HEMOGLOBIN A1C  04/08/2023   INFLUENZA VACCINE  07/02/2023 (Originally 11/02/2022)   Diabetic kidney evaluation - eGFR measurement  10/06/2023   Diabetic kidney evaluation - Urine ACR  10/06/2023   FOOT EXAM  10/06/2023   Medicare Annual Wellness (AWV)  04/29/2024   DTaP/Tdap/Td (2 - Td or Tdap) 11/01/2024   Colonoscopy  01/10/2033   Pneumococcal Vaccine 21-65 Years old  Completed   Hepatitis C Screening  Completed   HIV Screening  Completed   HPV VACCINES  Aged Out   COVID-19 Vaccine  Discontinued    Health Maintenance  Health Maintenance Due  Topic Date Due   OPHTHALMOLOGY EXAM  Never done   Zoster Vaccines- Shingrix (1 of 2) Never done   Lung Cancer Screening  Never done   HEMOGLOBIN A1C   04/08/2023    Colorectal cancer screening: Type of screening: Colonoscopy. Completed 01/11/2023. Repeat every 3 years  Lung Cancer Screening: (Low Dose CT Chest recommended if Age 67-80 years, 20 pack-year currently smoking OR have quit w/in 15years.) does qualify.   Lung Cancer Screening Referral: ordered on 02/08/2023  Additional Screening:  Hepatitis C Screening: does qualify; Completed 10/09/2019  Vision Screening: Recommended annual ophthalmology exams for early detection of glaucoma and other disorders of the eye. Is the patient up to date with their annual eye exam?  Yes  Who is the provider or what is the  name of the office in which the patient attends annual eye exams? [facility] If pt is not established with a provider, would they like to be referred to a provider to establish care? No .   Dental Screening: Recommended annual dental exams for proper oral hygiene  Diabetic Foot Exam: n/a  Community Resource Referral / Chronic Care Management: CRR required this visit?  No   CCM required this visit?  No     Plan:     I have personally reviewed and noted the following in the patient's chart:   Medical and social history Use of alcohol, tobacco or illicit drugs  Current medications and supplements including opioid prescriptions. Patient is not currently taking opioid prescriptions. Functional ability and status Nutritional status Physical activity Advanced directives List of other physicians Hospitalizations, surgeries, and ER visits in previous 12 months Vitals Screenings to include cognitive, depression, and falls Referrals and appointments  In addition, I have reviewed and discussed with patient certain preventive protocols, quality metrics, and best practice recommendations. A written personalized care plan for preventive services as well as general preventive health recommendations were provided to patient.     Barb Merino, LPN   12/16/7827   After Visit  Summary: (MyChart) Due to this being a telephonic visit, the after visit summary with patients personalized plan was offered to patient via MyChart   Nurse Notes: none

## 2023-04-30 NOTE — Patient Instructions (Signed)
[name] , Thank you for taking time to come for your Medicare Wellness Visit. I appreciate your ongoing commitment to your health goals. Please review the following plan we discussed and let me know if I can assist you in the future.   Referrals/Orders/Follow-Ups/Clinician Recommendations: none  This is a list of the screening recommended for you and due dates:  Health Maintenance  Topic Date Due   Eye exam for diabetics  Never done   Zoster (Shingles) Vaccine (1 of 2) Never done   Screening for Lung Cancer  Never done   Hemoglobin A1C  04/08/2023   Flu Shot  07/02/2023*   Yearly kidney function blood test for diabetes  10/06/2023   Yearly kidney health urinalysis for diabetes  10/06/2023   Complete foot exam   10/06/2023   Medicare Annual Wellness Visit  04/29/2024   DTaP/Tdap/Td vaccine (2 - Td or Tdap) 11/01/2024   Colon Cancer Screening  01/10/2033   Pneumococcal Vaccination  Completed   Hepatitis C Screening  Completed   HIV Screening  Completed   HPV Vaccine  Aged Out   COVID-19 Vaccine  Discontinued  *Topic was postponed. The date shown is not the original due date.    Advanced directives: (ACP Link)Information on Advanced Care Planning can be found at [facility] Advance Health Care Directives Advance Health Care Directives ([email or web address]   Next Medicare Annual Wellness Visit scheduled for next year: Yes  insert Preventive Care attachment Insert FALL PREVENTION attachment if needed

## 2023-05-05 ENCOUNTER — Other Ambulatory Visit: Payer: Self-pay | Admitting: Cardiology

## 2023-05-05 DIAGNOSIS — I480 Paroxysmal atrial fibrillation: Secondary | ICD-10-CM

## 2023-05-07 ENCOUNTER — Telehealth: Payer: Self-pay | Admitting: Family Medicine

## 2023-05-07 ENCOUNTER — Ambulatory Visit: Payer: Medicare HMO | Admitting: Family Medicine

## 2023-05-07 NOTE — Telephone Encounter (Signed)
OV with Dr Doreene Burke for 05/07/23.

## 2023-05-07 NOTE — Telephone Encounter (Signed)
Copied from CRM [id number]. Topic: Appointments - Appointment Cancel/Reschedule >> May 07, 2023 10:32 AM Pascal Lux wrote: Patient/patient representative is calling to cancel or reschedule an appointment. Refer to attachments for appointment information. Patient spouse called to cancel appointment because patient had a vitual visit last week.

## 2023-05-07 NOTE — Telephone Encounter (Signed)
Eliquis 5mg  refill request received. Patient is 58 years old, weight-120.7kg, Crea-1.23 on 10/06/22, Diagnosis-Afib, and last seen by Robin Searing, NP on 01/03/23 and pending appt with Dr. Jens Som on 06/05/23. Dose is appropriate based on dosing criteria. Will send in refill to requested pharmacy.

## 2023-05-08 NOTE — Telephone Encounter (Signed)
Pt is rescheduled for 05/11/2023. Pt and wife did not realize the VV 04/30/2023 with Herbie Saxon was not considered a visit with "PCP". I am not counting 05/07/2023 as missed visit.

## 2023-05-10 ENCOUNTER — Telehealth: Payer: Self-pay | Admitting: Hematology

## 2023-05-10 NOTE — Telephone Encounter (Signed)
 Rescheduled appointments per provider request. Left the patient a voicemail with the updated date and times. Patient will be mailed an appointment reminder.

## 2023-05-11 ENCOUNTER — Ambulatory Visit: Payer: Medicare HMO | Admitting: Family Medicine

## 2023-05-11 VITALS — BP 114/78 | HR 89 | Temp 98.0°F | Ht 71.0 in | Wt 260.8 lb

## 2023-05-11 DIAGNOSIS — E039 Hypothyroidism, unspecified: Secondary | ICD-10-CM

## 2023-05-11 DIAGNOSIS — E7800 Pure hypercholesterolemia, unspecified: Secondary | ICD-10-CM | POA: Diagnosis not present

## 2023-05-11 DIAGNOSIS — R7303 Prediabetes: Secondary | ICD-10-CM

## 2023-05-11 DIAGNOSIS — R058 Other specified cough: Secondary | ICD-10-CM | POA: Diagnosis not present

## 2023-05-11 LAB — BASIC METABOLIC PANEL
BUN: 9 mg/dL (ref 6–23)
CO2: 27 meq/L (ref 19–32)
Calcium: 9.3 mg/dL (ref 8.4–10.5)
Chloride: 105 meq/L (ref 96–112)
Creatinine, Ser: 1.16 mg/dL (ref 0.40–1.50)
GFR: 70.18 mL/min (ref >60.00)
Glucose, Bld: 86 mg/dL (ref 70–99)
Potassium: 4 meq/L (ref 3.5–5.1)
Sodium: 143 meq/L (ref 135–145)

## 2023-05-11 LAB — LIPID PANEL
Cholesterol: 129 mg/dL (ref 0–200)
HDL: 37.1 mg/dL — ABNORMAL LOW (ref >39.00)
LDL Cholesterol: 65 mg/dL (ref 0–99)
NonHDL: 91.69
Total CHOL/HDL Ratio: 3
Triglycerides: 132 mg/dL (ref 0.0–149.0)
VLDL: 26.4 mg/dL (ref 0.0–40.0)

## 2023-05-11 LAB — TSH: TSH: 2.47 u[IU]/mL (ref 0.35–5.50)

## 2023-05-11 LAB — HEMOGLOBIN A1C: Hgb A1c MFr Bld: 6.2 % (ref 4.6–6.5)

## 2023-05-11 MED ORDER — PROMETHAZINE-DM 6.25-15 MG/5ML PO SYRP: 5.0000 mL | ORAL_SOLUTION | Freq: Four times a day (QID) | ORAL | 0 refills | Status: DC | PRN

## 2023-05-11 MED ORDER — PREDNISONE 10 MG PO TABS
10.0000 mg | ORAL_TABLET | Freq: Two times a day (BID) | ORAL | 0 refills | Status: AC
Start: 2023-05-11 — End: 2023-05-18

## 2023-05-11 NOTE — Progress Notes (Signed)
 Established Patient Office Visit   Subjective:  Patient ID: Billy [name], male    DOB: Aug 05, 1965  Age: 58 y.o. MRN: [id number]  Chief Complaint  Patient presents with   Cough    Cough x 1 week. Congestion OTC not helping.    Medical Management of Chronic Issues    Follow up. Pt is fasting.     Cough Associated symptoms include wheezing. Pertinent negatives include no eye redness, myalgias, rash or shortness of breath.   Encounter Diagnoses  Name Primary?   Post-viral cough syndrome Yes   Prediabetes    Hypothyroidism, unspecified type    Elevated LDL cholesterol level    For follow-up of recent URI.  Symptoms are improving but there is a lingering dry cough associated with some wheezing.  Denies fevers chills or sputum production.  Some lingering nasal congestion.  For follow-up of prediabetes hypothyroidism and elevated cholesterol.  Continues empagliflozin  for diabetes and cardiomyopathy.  Continues high-dose atorvastatin .  Continues 50 mcg of levothyroxine .   Review of Systems  Constitutional: Negative.   HENT:  Positive for congestion.   Eyes:  Negative for blurred vision, discharge and redness.  Respiratory:  Positive for cough and wheezing. Negative for sputum production and shortness of breath.   Cardiovascular: Negative.   Gastrointestinal:  Negative for abdominal pain.  Genitourinary: Negative.   Musculoskeletal: Negative.  Negative for myalgias.  Skin:  Negative for rash.  Neurological:  Negative for tingling, loss of consciousness and weakness.  Endo/Heme/Allergies:  Negative for polydipsia.     Current Outpatient Medications:    alum & mag hydroxide-simeth (MAALOX MAX) 400-400-40 MG/5ML suspension, Take 10 mLs by mouth every 6 (six) hours as needed for indigestion., Disp: 355 mL, Rfl: 0   apixaban  (ELIQUIS ) 5 MG TABS tablet, TAKE 1 TABLET TWICE DAILY, Disp: 180 tablet, Rfl: 1   atorvastatin  (LIPITOR ) 80 MG tablet, TAKE 1 TABLET EVERY DAY, Disp: 90 tablet,  Rfl: 3   carvedilol  (COREG ) 3.125 MG tablet, TAKE 1 TABLET TWICE DAILY WITH MEALS, Disp: 60 tablet, Rfl: 0   clopidogrel  (PLAVIX ) 75 MG tablet, Take 1 tablet (75 mg total) by mouth daily., Disp: 90 tablet, Rfl: 3   empagliflozin  (JARDIANCE ) 10 MG TABS tablet, Take 1 tablet (10 mg total) by mouth daily., Disp: 90 tablet, Rfl: 3   ENTRESTO  24-26 MG, TAKE 1 TABLET TWICE DAILY, Disp: 180 tablet, Rfl: 3   ezetimibe  (ZETIA ) 10 MG tablet, TAKE 1 TABLET EVERY DAY, Disp: 90 tablet, Rfl: 3   fluticasone  (FLONASE ) 50 MCG/ACT nasal spray, Place 2 sprays into both nostrils daily as needed for allergies or rhinitis., Disp: 16 g, Rfl: 11   furosemide  (LASIX ) 40 MG tablet, TAKE 1 TABLET EVERY DAY, Disp: 90 tablet, Rfl: 0   isosorbide  mononitrate (IMDUR ) 30 MG 24 hr tablet, Take 0.5 tablets (15 mg total) by mouth daily., Disp: 90 tablet, Rfl: 3   levothyroxine  (SYNTHROID ) 50 MCG tablet, TAKE 1 TABLET BY MOUTH DAILY BEFORE BREAKFAST. 30 MINUTES BEFORE HAVING BREAKFAST., Disp: 90 tablet, Rfl: 0   montelukast  (SINGULAIR ) 10 MG tablet, TAKE 1 TABLET EVERY DAY AS NEEDED FOR ALLERGIES, Disp: 90 tablet, Rfl: 1   nitroGLYCERIN  (NITROSTAT ) 0.4 MG SL tablet, DISSOLVE 1 TABLET UNDER THE TONGUE EVERY 5 MINUTES FOR 3 DOSES AS NEEDED FOR CHEST PAIN, Disp: 25 tablet, Rfl: 11   pantoprazole  (PROTONIX ) 40 MG tablet, Take 1 tablet (40 mg total) by mouth daily., Disp: 90 tablet, Rfl: 3   polyethylene glycol (MIRALAX  / GLYCOLAX ) 17  g packet, Take 17 g by mouth daily., Disp: 14 each, Rfl: 0   potassium chloride  SA (KLOR-CON  M) 20 MEQ tablet, Take 1 tablet (20 mEq total) by mouth daily., Disp: 90 tablet, Rfl: 2   predniSONE  (DELTASONE ) 10 MG tablet, Take 1 tablet (10 mg total) by mouth 2 (two) times daily with a meal for 7 days., Disp: 14 tablet, Rfl: 0   promethazine -dextromethorphan (PROMETHAZINE -DM) 6.25-15 MG/5ML syrup, Take 5 mLs by mouth 4 (four) times daily as needed for cough., Disp: 118 mL, Rfl: 0   famotidine  (PEPCID ) 40 MG  tablet, Take 1 tablet (40 mg total) by mouth at bedtime. (Patient not taking: Reported on 05/11/2023), Disp: 30 tablet, Rfl: 11   Iron , Ferrous Sulfate , 325 (65 Fe) MG TABS, Take 325 mg by mouth daily. (Patient not taking: Reported on 05/11/2023), Disp: 270 tablet, Rfl: 1   Objective:     BP 114/78   Pulse 89   Temp 98 F (36.7 C)   Ht 5' 11 (1.803 m)   Wt 260 lb 12.8 oz (118.3 kg)   SpO2 95%   BMI 36.37 kg/m    Physical Exam Constitutional:      General: He is not in acute distress.    Appearance: Normal appearance. He is not ill-appearing, toxic-appearing or diaphoretic.  HENT:     Head: Normocephalic and atraumatic.     Right Ear: External ear normal.     Left Ear: External ear normal.     Mouth/Throat:     Mouth: Mucous membranes are moist.     Pharynx: Oropharynx is clear. No oropharyngeal exudate or posterior oropharyngeal erythema.  Eyes:     General: No scleral icterus.       Right eye: No discharge.        Left eye: No discharge.     Extraocular Movements: Extraocular movements intact.     Conjunctiva/sclera: Conjunctivae normal.     Pupils: Pupils are equal, round, and reactive to light.  Cardiovascular:     Rate and Rhythm: Normal rate and regular rhythm.  Pulmonary:     Effort: Pulmonary effort is normal. No respiratory distress.     Breath sounds: Normal breath sounds. No wheezing, rhonchi or rales.  Abdominal:     General: Bowel sounds are normal.     Tenderness: There is no abdominal tenderness. There is no guarding.  Musculoskeletal:     Cervical back: No rigidity or tenderness.  Skin:    General: Skin is warm and dry.  Neurological:     Mental Status: He is alert and oriented to person, place, and time.  Psychiatric:        Mood and Affect: Mood normal.        Behavior: Behavior normal.      No results found for any visits on 05/11/23.    The ASCVD Risk score (Arnett DK, et al., 2019) failed to calculate for the following reasons:   Risk  score cannot be calculated because patient has a medical history suggesting prior/existing ASCVD    Assessment & Plan:   Post-viral cough syndrome -     predniSONE ; Take 1 tablet (10 mg total) by mouth 2 (two) times daily with a meal for 7 days.  Dispense: 14 tablet; Refill: 0 -     Promethazine -DM; Take 5 mLs by mouth 4 (four) times daily as needed for cough.  Dispense: 118 mL; Refill: 0  Prediabetes -     Basic metabolic panel -  Hemoglobin A1c  Hypothyroidism, unspecified type -     TSH  Elevated LDL cholesterol level -     Lipid panel    Return in about 6 months (around 11/08/2023), or if symptoms worsen or fail to improve.  Short course of low-dose prednisone  for postviral cough syndrome.  PM cough syrup.  Salt water nose spray for nasal congestion.  Encouraged regular exercise by walking for 30 minutes most days of the week to the extent that he is able.  Rechecking A1c and TSH.  Medications adjusted pending results of labs.  Elsie Sim Lent, MD

## 2023-05-14 ENCOUNTER — Encounter: Payer: Self-pay | Admitting: Family Medicine

## 2023-05-26 ENCOUNTER — Other Ambulatory Visit: Payer: Self-pay | Admitting: Cardiovascular Disease

## 2023-05-28 ENCOUNTER — Other Ambulatory Visit: Payer: Self-pay | Admitting: Cardiology

## 2023-05-28 NOTE — Addendum Note (Signed)
 Addended by: Adriana Simas, Lui Bellis L on: 05/28/2023 10:03 AM   Modules accepted: Orders

## 2023-05-28 NOTE — Telephone Encounter (Signed)
 Refill Request.

## 2023-06-02 ENCOUNTER — Other Ambulatory Visit: Payer: Self-pay | Admitting: Cardiology

## 2023-06-02 DIAGNOSIS — K219 Gastro-esophageal reflux disease without esophagitis: Secondary | ICD-10-CM

## 2023-06-04 ENCOUNTER — Encounter: Payer: Self-pay | Admitting: Nurse Practitioner

## 2023-06-04 ENCOUNTER — Ambulatory Visit: Payer: Medicare HMO | Admitting: Nurse Practitioner

## 2023-06-04 VITALS — BP 132/84 | HR 80 | Ht 71.0 in | Wt 261.0 lb

## 2023-06-04 DIAGNOSIS — I5022 Chronic systolic (congestive) heart failure: Secondary | ICD-10-CM

## 2023-06-04 DIAGNOSIS — Z87891 Personal history of nicotine dependence: Secondary | ICD-10-CM

## 2023-06-04 DIAGNOSIS — E669 Obesity, unspecified: Secondary | ICD-10-CM

## 2023-06-04 DIAGNOSIS — Z6836 Body mass index (BMI) 36.0-36.9, adult: Secondary | ICD-10-CM | POA: Diagnosis not present

## 2023-06-04 DIAGNOSIS — G4733 Obstructive sleep apnea (adult) (pediatric): Secondary | ICD-10-CM

## 2023-06-04 NOTE — Assessment & Plan Note (Signed)
BMI 36. Healthy weight loss encouraged.

## 2023-06-04 NOTE — Progress Notes (Signed)
 @Patient  ID: Billy [name], male    DOB: 1965/04/15, 58 y.o.   MRN: [id number]  Chief Complaint  Patient presents with   Follow-up    Review HST June 2024.     Referring provider: Mliss Sax  HPI: 58 year old male, former smoker followed for OSA.  Past medical history significant for hypertension, CAD s/p CABG, ischemic cardiomyopathy status post ICD, HFrEF, GERD, IBS, hypothyroidism, DM, CKD, obesity.  TEST/EVENTS:  08/30/2022 HST: AHI 16.2/h, SpO2 low 77%  07/25/2022: OV with Aiyah Scarpelli NP for sleep consult, referred by Dr. Doreene Burke.  He has had trouble with his sleep for a while now.  He has been told that he snores and stops breathing when he sleeps at night.  Does have some daytime fatigue symptoms.  Occasionally feels like his sleep is not as restful as it should be.  Does usually wake up with dry mouth.  He has some occasional sleep talking.  Denies any sleepwalking, drowsy driving, morning headaches, nightmares, sleep paralysis.  No history of narcolepsy or symptoms of cataplexy. He goes to bed around 9 PM.  Falls asleep within an hour.  Usually wakes once to use the restroom.  Officially gets up around 6:30 AM.  He has gained 20 pounds over the last 2 years.  Never had a previous sleep study before He has a history of high blood pressure, MI, heart failure and heart rhythm problems.  No history of stroke. He is a former smoker.  Does not drink any alcohol.  He is on disability.  Lives at home with his wife.  Family history of heart disease. [place] 10  06/04/2023: Today - follow up Patient presents today with his wife for overdue follow up. He had his sleep study the end of May, which revealed moderate sleep apnea. He was lost to follow up afterwards. He tells me today that he feels the same compared to his last visit. He does have witnessed apneas, daytime sleepiness and snoring. Denies any drowsy driving, morning headaches, sleep parasomnias/paralysis. Wants to discuss  treatment options.   No Known Allergies  Immunization History  Administered Date(s) Administered   Influenza,inj,Quad PF,6+ Mos 12/10/2013, 03/01/2021   Moderna Sars-Covid-2 Vaccination 10/11/2019, 11/08/2019   PNEUMOCOCCAL CONJUGATE-20 03/01/2021   Tdap 11/02/2014    Past Medical History:  Diagnosis Date   Aortic atherosclerosis ([name])    on CXR   Blood in stool    C. difficile colitis    a. remote hx 2010.   Cardiac arrest - ventricular fibrillation 02/11/2009   a. 02/2009 s/p [employer] Jude ICD.   Chronic combined systolic and diastolic CHF (congestive heart failure) ([name])    Chronic kidney disease, stage 3a ([name])    Coronary artery disease    a. PCI to Cx age 85. b. CABGx4 in 2003. c. BMS to OM1 in 12/2007. d. PTCA to Cx 01/2019.   Former tobacco use    Hyperlipidemia    Hypertension    Ischemic cardiomyopathy    Marijuana abuse    Myocardial infarct ([name])    NON-ST-SEGMENT ELEVATION MI   Obesity    PAD (peripheral artery disease) ([name])    PAF (paroxysmal atrial fibrillation) ([name])    Pathologic fracture of left acetabulum 07/03/2021   PSVT (paroxysmal supraventricular tachycardia) ([name])    Ventricular tachycardia ([name])     Tobacco History: Social History   Tobacco Use  Smoking Status Former   Current packs/day: 0.00   Average packs/day: 2.0 packs/day for 20.0 years (40.0 ttl  pk-yrs)   Types: Cigarettes   Start date: 2001   Quit date: 2021   Years since quitting: 4.1   Passive exposure: Never  Smokeless Tobacco Never   Counseling given: Not Answered   Outpatient Medications Prior to Visit  Medication Sig Dispense Refill   alum & mag hydroxide-simeth (MAALOX MAX) 400-400-40 MG/5ML suspension Take 10 mLs by mouth every 6 (six) hours as needed for indigestion. 355 mL 0   apixaban (ELIQUIS) 5 MG TABS tablet TAKE 1 TABLET TWICE DAILY 180 tablet 1   atorvastatin (LIPITOR) 80 MG tablet TAKE 1 TABLET EVERY DAY 90 tablet 3   carvedilol (COREG) 3.125 MG tablet TAKE 1  TABLET TWICE DAILY WITH MEALS 60 tablet 11   clopidogrel (PLAVIX) 75 MG tablet Take 1 tablet (75 mg total) by mouth daily. 90 tablet 3   empagliflozin (JARDIANCE) 10 MG TABS tablet Take 1 tablet (10 mg total) by mouth daily. 90 tablet 3   ENTRESTO 24-26 MG TAKE 1 TABLET TWICE DAILY 180 tablet 3   ezetimibe (ZETIA) 10 MG tablet TAKE 1 TABLET EVERY DAY 90 tablet 3   famotidine (PEPCID) 40 MG tablet Take 1 tablet (40 mg total) by mouth at bedtime. 30 tablet 11   fluticasone (FLONASE) 50 MCG/ACT nasal spray Place 2 sprays into both nostrils daily as needed for allergies or rhinitis. 16 g 11   furosemide (LASIX) 40 MG tablet TAKE 1 TABLET EVERY DAY 90 tablet 0   Iron, Ferrous Sulfate, 325 (65 Fe) MG TABS Take 325 mg by mouth daily. 270 tablet 1   isosorbide mononitrate (IMDUR) 30 MG 24 hr tablet Take 0.5 tablets (15 mg total) by mouth daily. 90 tablet 3   levothyroxine (SYNTHROID) 50 MCG tablet TAKE 1 TABLET BY MOUTH DAILY BEFORE BREAKFAST. 30 MINUTES BEFORE HAVING BREAKFAST. 90 tablet 0   montelukast (SINGULAIR) 10 MG tablet TAKE 1 TABLET EVERY DAY AS NEEDED FOR ALLERGIES 90 tablet 1   nitroGLYCERIN (NITROSTAT) 0.4 MG SL tablet DISSOLVE 1 TABLET UNDER THE TONGUE EVERY 5 MINUTES FOR 3 DOSES AS NEEDED FOR CHEST PAIN 25 tablet 11   pantoprazole (PROTONIX) 40 MG tablet Take 1 tablet (40 mg total) by mouth daily. 90 tablet 3   polyethylene glycol (MIRALAX / GLYCOLAX) 17 g packet Take 17 g by mouth daily. 14 each 0   potassium chloride SA (KLOR-CON M) 20 MEQ tablet Take 1 tablet (20 mEq total) by mouth daily. 90 tablet 2   promethazine-dextromethorphan (PROMETHAZINE-DM) 6.25-15 MG/5ML syrup Take 5 mLs by mouth 4 (four) times daily as needed for cough. 118 mL 0   No facility-administered medications prior to visit.     Review of Systems:   Constitutional: No night sweats, fevers, chills, or lassitude. +occasional daytime fatigue, weight gain HEENT: No headaches, difficulty swallowing, tooth/dental  problems, or sore throat. No sneezing, itching, ear ache, nasal congestion, or post nasal drip CV:  No chest pain, orthopnea, PND, swelling in lower extremities, anasarca, dizziness, palpitations, syncope Resp: +snoring, witnessed apneas; shortness of breath with exertion (baseline). No excess mucus or change in color of mucus. No productive or non-productive. No hemoptysis. No wheezing.  No chest wall deformity GI:  +heartburn, indigestion (baseline). No abdominal pain, nausea, vomiting, diarrhea, change in bowel habits, loss of appetite, bloody stools.  GU: No dysuria, change in color of urine, urgency or frequency.   Skin: No rash, lesions, ulcerations MSK:  No joint pain or swelling.   Neuro: No dizziness or lightheadedness.  Psych: No depression  or anxiety. Mood stable.     Physical Exam:  BP 132/84   Pulse 80   Ht 5\' 11"  (1.803 m)   Wt 261 lb (118.4 kg)   SpO2 99%   BMI 36.40 kg/m   GEN: Pleasant, interactive, well-kempt; obese; in no acute distress HEENT:  Normocephalic and atraumatic. PERRLA. Sclera white. Nasal turbinates pink, moist and patent bilaterally. No rhinorrhea present. Oropharynx pink and moist, without exudate or edema. No lesions, ulcerations, or postnasal drip. Mallampati III/IV NECK:  Supple w/ fair ROM. No JVD present. Normal carotid impulses w/o bruits. Thyroid symmetrical with no goiter or nodules palpated. No lymphadenopathy.   CV: RRR, no m/r/g, no peripheral edema. Pulses intact, +2 bilaterally. No cyanosis, pallor or clubbing. PULMONARY:  Unlabored, regular breathing. Clear bilaterally A&P w/o wheezes/rales/rhonchi. No accessory muscle use.  GI: BS present and normoactive. Soft, non-tender to palpation. No organomegaly or masses detected. MSK: No erythema, warmth or tenderness. Cap refil <2 sec all extrem. No deformities or joint swelling noted.  Neuro: A/Ox3. No focal deficits noted.   Skin: Warm, no lesions or rashe Psych: Normal affect and behavior.  Judgement and thought content appropriate.     Lab Results:  CBC    Component Value Date/Time   WBC 10.2 12/28/2022 1151   WBC 9.9 10/06/2022 1135   RBC 7.18 (H) 12/28/2022 1151   HGB 18.5 (H) 12/28/2022 1151   HGB 16.3 05/19/2022 0822   HCT 54.6 (H) 12/28/2022 1151   HCT 54.9 (H) 05/19/2022 0822   PLT 271 12/28/2022 1151   PLT 346 05/19/2022 0822   MCV 76.0 (L) 12/28/2022 1151   MCV 68 (L) 05/19/2022 0822   MCH 25.8 (L) 12/28/2022 1151   MCHC 33.9 12/28/2022 1151   RDW 22.9 (H) 12/28/2022 1151   RDW 23.1 (H) 05/19/2022 0822   LYMPHSABS 2.6 12/28/2022 1151   LYMPHSABS 2.9 06/14/2020 1117   MONOABS 0.9 12/28/2022 1151   EOSABS 0.2 12/28/2022 1151   EOSABS 0.3 06/14/2020 1117   BASOSABS 0.1 12/28/2022 1151   BASOSABS 0.0 06/14/2020 1117    BMET    Component Value Date/Time   NA 143 05/11/2023 1115   NA 138 12/23/2021 0818   K 4.0 05/11/2023 1115   CL 105 05/11/2023 1115   CO2 27 05/11/2023 1115   GLUCOSE 86 05/11/2023 1115   BUN 9 05/11/2023 1115   BUN 16 12/23/2021 0818   CREATININE 1.16 05/11/2023 1115   CREATININE 1.09 01/07/2015 0942   CALCIUM 9.3 05/11/2023 1115   GFRNONAA >60 06/10/2022 1432   GFRNONAA 84 12/10/2013 1548   GFRAA 79 04/01/2020 1541   GFRAA >89 12/10/2013 1548    BNP    Component Value Date/Time   BNP 50.3 07/03/2021 0052     Imaging:  No results found.  Administration History     None           No data to display          No results found for: "NITRICOXIDE"      Assessment & Plan:   Moderate obstructive sleep apnea Moderate OSA with severe oxygen desaturations. Reviewed risks of untreated OSA and potential treatment options. Shared decision to move forward with CPAP therapy given severity and cardiac history. Educated on proper care/use of device. Risks/benefits reviewed. Healthy weight loss encouraged. Reassess response/use at follow up. Safe driving practices reviewed.  Patient Instructions  Start CPAP  every night, minimum of 4-6 hours a night.  Change equipment as directed. Wash  your tubing with warm soap and water daily, hang to dry. Wash humidifier portion weekly. Use bottled, distilled water and change daily Be aware of reduced alertness and do not drive or operate heavy machinery if experiencing this or drowsiness.  Exercise encouraged, as tolerated. Healthy weight management discussed.  Avoid or decrease alcohol consumption and medications that make you more sleepy, if possible. Notify if persistent daytime sleepiness occurs even with consistent use of PAP therapy.  If you haven't heard from a medical supply company in the next 2-3 weeks, call me and let me know so I can follow up on this.  We discussed how untreated sleep apnea puts an individual at risk for cardiac arrhthymias, pulm HTN, DM, stroke and increases their risk for daytime accidents. We also briefly reviewed treatment options including weight loss, side sleeping position, oral appliance, CPAP therapy or referral to ENT for possible surgical options  Change supplies out.... Every month Mask cushions and/or nasal pillows CPAP machine filters Every 3 months Mask frame (not including the headgear) CPAP tubing Every 6 months Mask headgear Chin strap (if applicable) Humidifier water tub  Follow up in 8-12 weeks with Florentina Addison Hanni Milford,NP to see how CPAP therapy is going, or sooner, if needed    Obesity (BMI 30-39.9) BMI 36. Healthy weight loss encouraged  Chronic HFrEF (heart failure with reduced ejection fraction) ([name]) Euvolemic on exam. Follow up with cardiology as scheduled    I spent 35 minutes of dedicated to the care of this patient on the date of this encounter to include pre-visit review of records, face-to-face time with the patient discussing conditions above, post visit ordering of testing, clinical documentation with the electronic health record, making appropriate referrals as documented, and communicating  necessary findings to members of the patients care team.  Noemi Chapel, NP 06/04/2023  Pt aware and understands NP's role.

## 2023-06-04 NOTE — Patient Instructions (Addendum)
 Start CPAP every night, minimum of 4-6 hours a night.  Change equipment as directed. Wash your tubing with warm soap and water daily, hang to dry. Wash humidifier portion weekly. Use bottled, distilled water and change daily Be aware of reduced alertness and do not drive or operate heavy machinery if experiencing this or drowsiness.  Exercise encouraged, as tolerated. Healthy weight management discussed.  Avoid or decrease alcohol consumption and medications that make you more sleepy, if possible. Notify if persistent daytime sleepiness occurs even with consistent use of PAP therapy.  If you haven't heard from a medical supply company in the next 2-3 weeks, call me and let me know so I can follow up on this.  We discussed how untreated sleep apnea puts an individual at risk for cardiac arrhthymias, pulm HTN, DM, stroke and increases their risk for daytime accidents. We also briefly reviewed treatment options including weight loss, side sleeping position, oral appliance, CPAP therapy or referral to ENT for possible surgical options  Change supplies out.... Every month Mask cushions and/or nasal pillows CPAP machine filters Every 3 months Mask frame (not including the headgear) CPAP tubing Every 6 months Mask headgear Chin strap (if applicable) Humidifier water tub  Follow up in 8-12 weeks with Billy Jeanie Mccard,NP to see how CPAP therapy is going, or sooner, if needed

## 2023-06-04 NOTE — Assessment & Plan Note (Signed)
 Moderate OSA with severe oxygen desaturations. Reviewed risks of untreated OSA and potential treatment options. Shared decision to move forward with CPAP therapy given severity and cardiac history. Educated on proper care/use of device. Risks/benefits reviewed. Healthy weight loss encouraged. Reassess response/use at follow up. Safe driving practices reviewed.  Patient Instructions  Start CPAP every night, minimum of 4-6 hours a night.  Change equipment as directed. Wash your tubing with warm soap and water daily, hang to dry. Wash humidifier portion weekly. Use bottled, distilled water and change daily Be aware of reduced alertness and do not drive or operate heavy machinery if experiencing this or drowsiness.  Exercise encouraged, as tolerated. Healthy weight management discussed.  Avoid or decrease alcohol consumption and medications that make you more sleepy, if possible. Notify if persistent daytime sleepiness occurs even with consistent use of PAP therapy.  If you haven't heard from a medical supply company in the next 2-3 weeks, call me and let me know so I can follow up on this.  We discussed how untreated sleep apnea puts an individual at risk for cardiac arrhthymias, pulm HTN, DM, stroke and increases their risk for daytime accidents. We also briefly reviewed treatment options including weight loss, side sleeping position, oral appliance, CPAP therapy or referral to ENT for possible surgical options  Change supplies out.... Every month Mask cushions and/or nasal pillows CPAP machine filters Every 3 months Mask frame (not including the headgear) CPAP tubing Every 6 months Mask headgear Chin strap (if applicable) Humidifier water tub  Follow up in 8-12 weeks with Katie Kevontay Burks,NP to see how CPAP therapy is going, or sooner, if needed

## 2023-06-04 NOTE — Progress Notes (Unsigned)
 HPI: FU CAD. Patient suffered a cardiac arrest in November of 2010. Echocardiogram initially revealed an EF of 10-15% however followup echo showed an EF of 40-45. Given out of the hospital VF arrest a St. Jude single lead ICD was placed. Also had episode of atrial fibrillation November 2020 and dyspnea with Brilinta. Cardiac catheterization February 2022 showed old occlusion of the first diagonal, old occlusion of the mid circumflex but with good collateralization to the distal vessel, mid total occlusion of the right coronary artery with collateralization to the distal PDA and PLA, atretic LIMA to the LAD, patent SVG to the diagonal and occlusion of the saphenous vein graft to the OM 2 and of the SVG to distal right coronary artery.  Medical therapy recommended.   Last echocardiogram March 2023 showed ejection fraction 40 to 45%, mild left ventricular hypertrophy, grade 1 diastolic dysfunction. Patient also with peripheral vascular disease followed by Dr. Kirke Corin; he has had previous stent to the right external iliac artery.  Also with previous ablation of atrial tachycardia.  ABIs February 2024 showed no significant stenosis and patent right external iliac artery stent.  ABIs February 2024 moderate on the right and left.  Since I last saw him he has developed claudication in his left lower extremity again after walking short distances.  There is no dyspnea, chest pain, palpitations or syncope.  Current Outpatient Medications  Medication Sig Dispense Refill   alum & mag hydroxide-simeth (MAALOX MAX) 400-400-40 MG/5ML suspension Take 10 mLs by mouth every 6 (six) hours as needed for indigestion. 355 mL 0   apixaban (ELIQUIS) 5 MG TABS tablet TAKE 1 TABLET TWICE DAILY 180 tablet 1   atorvastatin (LIPITOR) 80 MG tablet TAKE 1 TABLET EVERY DAY 90 tablet 3   carvedilol (COREG) 3.125 MG tablet TAKE 1 TABLET TWICE DAILY WITH MEALS 60 tablet 11   clopidogrel (PLAVIX) 75 MG tablet Take 1 tablet (75 mg total)  by mouth daily. 90 tablet 3   empagliflozin (JARDIANCE) 10 MG TABS tablet Take 1 tablet (10 mg total) by mouth daily. 90 tablet 3   ENTRESTO 24-26 MG TAKE 1 TABLET TWICE DAILY 180 tablet 3   ezetimibe (ZETIA) 10 MG tablet TAKE 1 TABLET EVERY DAY 90 tablet 3   fluticasone (FLONASE) 50 MCG/ACT nasal spray Place 2 sprays into both nostrils daily as needed for allergies or rhinitis. 16 g 11   furosemide (LASIX) 40 MG tablet TAKE 1 TABLET EVERY DAY 90 tablet 0   isosorbide mononitrate (IMDUR) 30 MG 24 hr tablet Take 0.5 tablets (15 mg total) by mouth daily. 90 tablet 3   levothyroxine (SYNTHROID) 50 MCG tablet TAKE 1 TABLET BY MOUTH DAILY BEFORE BREAKFAST. 30 MINUTES BEFORE HAVING BREAKFAST. 90 tablet 0   montelukast (SINGULAIR) 10 MG tablet TAKE 1 TABLET EVERY DAY AS NEEDED FOR ALLERGIES 90 tablet 1   nitroGLYCERIN (NITROSTAT) 0.4 MG SL tablet DISSOLVE 1 TABLET UNDER THE TONGUE EVERY 5 MINUTES FOR 3 DOSES AS NEEDED FOR CHEST PAIN 25 tablet 11   pantoprazole (PROTONIX) 40 MG tablet Take 1 tablet (40 mg total) by mouth daily. 90 tablet 3   polyethylene glycol (MIRALAX / GLYCOLAX) 17 g packet Take 17 g by mouth daily. 14 each 0   potassium chloride SA (KLOR-CON M) 20 MEQ tablet Take 1 tablet (20 mEq total) by mouth daily. 90 tablet 2   promethazine-dextromethorphan (PROMETHAZINE-DM) 6.25-15 MG/5ML syrup Take 5 mLs by mouth 4 (four) times daily as needed for cough. 118  mL 0   famotidine (PEPCID) 40 MG tablet Take 1 tablet (40 mg total) by mouth at bedtime. (Patient not taking: Reported on 06/05/2023) 30 tablet 11   Iron, Ferrous Sulfate, 325 (65 Fe) MG TABS Take 325 mg by mouth daily. (Patient not taking: Reported on 06/05/2023) 270 tablet 1   No current facility-administered medications for this visit.     Past Medical History:  Diagnosis Date   Aortic atherosclerosis ([name])    on CXR   Blood in stool    C. difficile colitis    a. remote hx 2010.   Cardiac arrest - ventricular fibrillation 02/11/2009    a. 02/2009 s/p St Jude ICD.   Chronic combined systolic and diastolic CHF (congestive heart failure) ([name])    Chronic kidney disease, stage 3a ([name])    Coronary artery disease    a. PCI to Cx age 89. b. CABGx4 in 2003. c. BMS to OM1 in 12/2007. d. PTCA to Cx 01/2019.   Former tobacco use    Hyperlipidemia    Hypertension    Ischemic cardiomyopathy    Marijuana abuse    Myocardial infarct ([name])    NON-ST-SEGMENT ELEVATION MI   Obesity    PAD (peripheral artery disease) ([name])    PAF (paroxysmal atrial fibrillation) ([name])    Pathologic fracture of left acetabulum 07/03/2021   PSVT (paroxysmal supraventricular tachycardia) ([name])    Ventricular tachycardia ([name])     Past Surgical History:  Procedure Laterality Date   ABDOMINAL AORTOGRAM W/LOWER EXTREMITY N/A 05/14/2019   Procedure: ABDOMINAL AORTOGRAM W/LOWER EXTREMITY;  Surgeon: Iran Ouch, MD;  Location: [facility];  Service: Cardiovascular;  Laterality: N/A;   CARDIAC DEFIBRILLATOR PLACEMENT     STJ single chamber ICD implanted for secondary prevention   CIRCUMCISION     COLONOSCOPY WITH PROPOFOL N/A 01/11/2023   Procedure: COLONOSCOPY WITH PROPOFOL;  Surgeon: Meryl Dare, MD;  Location: [facility];  Service: Gastroenterology;  Laterality: N/A;   CORONARY ARTERY BYPASS GRAFT  06/17/01   X 4   CORONARY BALLOON ANGIOPLASTY N/A 01/22/2019   Procedure: CORONARY BALLOON ANGIOPLASTY;  Surgeon: Marykay Lex, MD;  Location: [facility] INVASIVE CV LAB;  Service: Cardiovascular;  Laterality: N/A;   HEMOSTASIS CLIP PLACEMENT  01/11/2023   Procedure: HEMOSTASIS CLIP PLACEMENT;  Surgeon: Meryl Dare, MD;  Location: [facility];  Service: Gastroenterology;;   HIP CLOSED REDUCTION Left 07/02/2021   Procedure: CLOSED REDUCTION HIP with placement of skeletal traction;  Surgeon: Jones Broom, MD;  Location: [facility] OR;  Service: Orthopedics;  Laterality: Left;   ICD GENERATOR CHANGEOUT N/A 06/17/2021   Procedure: ICD GENERATOR  CHANGEOUT;  Surgeon: Duke Salvia, MD;  Location: [facility] INVASIVE CV LAB;  Service: Cardiovascular;  Laterality: N/A;   LEFT HEART CATH AND CORS/GRAFTS ANGIOGRAPHY N/A 01/22/2019   Procedure: LEFT HEART CATH AND CORS/GRAFTS ANGIOGRAPHY;  Surgeon: Marykay Lex, MD;  Location: [facility] INVASIVE CV LAB;  Service: Cardiovascular;  Laterality: N/A;   LEFT HEART CATH AND CORS/GRAFTS ANGIOGRAPHY N/A 05/18/2020   Procedure: LEFT HEART CATH AND CORS/GRAFTS ANGIOGRAPHY;  Surgeon: Lennette Bihari, MD;  Location: [facility] CV [facility];  Service: Cardiovascular;  Laterality: N/A;   OPEN REDUCTION INTERNAL FIXATION ACETABULUM FRACTURE POSTERIOR Left 07/04/2021   Procedure: OPEN REDUCTION INTERNAL FIXATION ACETABULUM FRACTURE POSTERIOR;  Surgeon: Myrene Galas, MD;  Location: [facility] OR;  Service: Orthopedics;  Laterality: Left;   PERIPHERAL VASCULAR INTERVENTION Right 05/14/2019   Procedure: PERIPHERAL VASCULAR INTERVENTION;  Surgeon: Lorine Bears  A, MD;  Location: [facility];  Service: Cardiovascular;  Laterality: Right;   POLYPECTOMY  01/11/2023   Procedure: POLYPECTOMY;  Surgeon: Meryl Dare, MD;  Location: [facility];  Service: Gastroenterology;;   SVT ABLATION N/A 06/21/2020   Procedure: SVT ABLATION;  Surgeon: Marinus Maw, MD;  Location: [facility] INVASIVE CV LAB;  Service: Cardiovascular;  Laterality: N/A;    Social History   Socioeconomic History   Marital status: Married    Spouse name: Not on file   Number of children: 3   Years of education: 11   Highest education level: 11th grade  Occupational History   Occupation: Disability  Tobacco Use   Smoking status: Former    Current packs/day: 0.00    Average packs/day: 2.0 packs/day for 20.0 years (40.0 ttl pk-yrs)    Types: Cigarettes    Start date: 2001    Quit date: 2021    Years since quitting: 4.1    Passive exposure: Never   Smokeless tobacco: Never  Vaping Use   Vaping status: Never Used  Substance and Sexual Activity   Alcohol  use: No   Drug use: No   Sexual activity: Yes    Partners: Female  Other Topics Concern   Not on file  Social History Narrative   MARRIED   FORMER TOBACCO USE, SMOKED FOR 20 YRS 2 PPD quit 2021   NO ETOH   NO ILLICIT DRUG USE   NO REGULAR EXERCISE         ICD-ST. JUDE ; CERTIFIED LETTER SENT AND RETURNED BAD ADDRESS, PLS UPDATE DJW.      Fun: Fishing    Social Drivers of Corporate investment banker Strain: Medium Risk (05/11/2023)   Overall Financial Resource Strain (CARDIA)    Difficulty of Paying Living Expenses: Somewhat hard  Food Insecurity: Food Insecurity Present (05/11/2023)   Hunger Vital Sign    Worried About Running Out of Food in the Last Year: Sometimes true    Ran Out of Food in the Last Year: Sometimes true  Transportation Needs: Patient Declined (05/11/2023)   PRAPARE - Administrator, Civil Service (Medical): Patient declined    Lack of Transportation (Non-Medical): Patient declined  Physical Activity: Inactive (04/30/2023)   Exercise Vital Sign    Days of Exercise per Week: 0 days    Minutes of Exercise per Session: 0 min  Stress: No Stress Concern Present (05/11/2023)   [employer] of Occupational Health - Occupational Stress Questionnaire    Feeling of Stress : Not at all  Social Connections: Moderately Isolated (05/11/2023)   Social Connection and Isolation Panel [NHANES]    Frequency of Communication with Friends and Family: Once a week    Frequency of Social Gatherings with Friends and Family: Once a week    Attends Religious Services: 1 to 4 times per year    Active Member of [employer] or Organizations: No    Attends Banker Meetings: Never    Marital Status: Married  Catering manager Violence: Not At Risk (04/30/2023)   Humiliation, Afraid, Rape, and Kick questionnaire    Fear of Current or Ex-Partner: No    Emotionally Abused: No    Physically Abused: No    Sexually Abused: No    Family History  Problem Relation Age of  Onset   Hypertension Mother    Heart attack Father        DIED AT 28 FROM MI   Heart disease  Sister        CAD AND PREVIOUS CABG   Cancer Paternal Uncle        throat   Cancer Paternal Uncle        leukemia   Hypertension Other        IN MOST OF HIS SIBLINGS    ROS: no fevers or chills, productive cough, hemoptysis, dysphasia, odynophagia, melena, hematochezia, dysuria, hematuria, rash, seizure activity, orthopnea, PND, pedal edema, claudication. Remaining systems are negative.  Physical Exam: Well-developed well-nourished in no acute distress.  Skin is warm and dry.  HEENT is normal.  Neck is supple.  Chest is clear to auscultation with normal expansion.  Cardiovascular exam is regular rate and rhythm.  Abdominal exam nontender or distended. No masses palpated. Extremities show no edema. neuro grossly intact  EKG Interpretation Date/Time:  Tuesday June 05 2023 10:10:28 EST Ventricular Rate:  68 PR Interval:  142 QRS Duration:  92 QT Interval:  424 QTC Calculation: 450 R Axis:   76  Text Interpretation: Sinus rhythm with frequent Premature ventricular complexes and Premature atrial complexes Inferior infarct , age undetermined Confirmed by Olga Millers ([id number]) on 06/05/2023 10:12:30 AM    A/P  1 coronary artery disease-patient doing well with no chest pain.  Continue statin.    2 ischemic cardiomyopathy-continue Entresto, carvedilol and Jardiance.  Add spironolactone 12.5 mg daily.  Check potassium and renal function in 1 week.  Repeat echocardiogram.  3 paroxysmal atrial fibrillation-patient is in sinus rhythm on examination today.  Continue beta-blocker and apixaban at present dose.  Check hemoglobin.  4 hypertension-patient's blood pressure is controlled.  Continue present medical regimen.  5 hyperlipidemia-continue statin.  Recent LDL 65.  Check liver functions.  6 ICD-Per EP.  7 ventricular tachycardia-ICD in place.  Amiodarone previously discontinued by  Dr. Graciela Husbands.  8 history of SVT status post ablation-no recurrences.  9 peripheral vascular disease-he is having recurrent claudication in his left lower extremity.  He is scheduled to see Dr. Kirke Corin later this morning.  Olga Millers, MD

## 2023-06-04 NOTE — Assessment & Plan Note (Signed)
 Euvolemic on exam. Follow up with cardiology as scheduled

## 2023-06-05 ENCOUNTER — Ambulatory Visit: Payer: Medicare HMO | Attending: Cardiology | Admitting: Cardiology

## 2023-06-05 ENCOUNTER — Ambulatory Visit (INDEPENDENT_AMBULATORY_CARE_PROVIDER_SITE_OTHER): Payer: Medicare HMO | Admitting: Cardiovascular Disease

## 2023-06-05 ENCOUNTER — Encounter: Payer: Self-pay | Admitting: Cardiology

## 2023-06-05 VITALS — BP 112/84 | HR 68 | Ht 71.0 in | Wt 261.0 lb

## 2023-06-05 DIAGNOSIS — E785 Hyperlipidemia, unspecified: Secondary | ICD-10-CM | POA: Diagnosis not present

## 2023-06-05 DIAGNOSIS — E7800 Pure hypercholesterolemia, unspecified: Secondary | ICD-10-CM | POA: Diagnosis not present

## 2023-06-05 DIAGNOSIS — Z951 Presence of aortocoronary bypass graft: Secondary | ICD-10-CM

## 2023-06-05 DIAGNOSIS — I25810 Atherosclerosis of coronary artery bypass graft(s) without angina pectoris: Secondary | ICD-10-CM

## 2023-06-05 DIAGNOSIS — I5022 Chronic systolic (congestive) heart failure: Secondary | ICD-10-CM

## 2023-06-05 DIAGNOSIS — Z95810 Presence of automatic (implantable) cardiac defibrillator: Secondary | ICD-10-CM

## 2023-06-05 DIAGNOSIS — I739 Peripheral vascular disease, unspecified: Secondary | ICD-10-CM

## 2023-06-05 DIAGNOSIS — I255 Ischemic cardiomyopathy: Secondary | ICD-10-CM

## 2023-06-05 DIAGNOSIS — I4901 Ventricular fibrillation: Secondary | ICD-10-CM | POA: Diagnosis not present

## 2023-06-05 DIAGNOSIS — I480 Paroxysmal atrial fibrillation: Secondary | ICD-10-CM | POA: Diagnosis not present

## 2023-06-05 MED ORDER — NITROGLYCERIN 0.4 MG SL SUBL: 0.4000 mg | SUBLINGUAL_TABLET | SUBLINGUAL | 11 refills | Status: DC | PRN

## 2023-06-05 MED ORDER — CLOPIDOGREL BISULFATE 75 MG PO TABS: 75.0000 mg | ORAL_TABLET | Freq: Every day | ORAL | 3 refills | Status: DC

## 2023-06-05 MED ORDER — CARVEDILOL 3.125 MG PO TABS: 3.1250 mg | ORAL_TABLET | Freq: Two times a day (BID) | ORAL | 3 refills | Status: DC

## 2023-06-05 MED ORDER — EMPAGLIFLOZIN 10 MG PO TABS: 10.0000 mg | ORAL_TABLET | Freq: Every day | ORAL | 3 refills | Status: DC

## 2023-06-05 MED ORDER — APIXABAN 5 MG PO TABS: 5.0000 mg | ORAL_TABLET | Freq: Two times a day (BID) | ORAL | 3 refills | Status: DC

## 2023-06-05 MED ORDER — FUROSEMIDE 40 MG PO TABS: 40.0000 mg | ORAL_TABLET | Freq: Every day | ORAL | 3 refills | Status: DC

## 2023-06-05 MED ORDER — EZETIMIBE 10 MG PO TABS: 10.0000 mg | ORAL_TABLET | Freq: Every day | ORAL | 3 refills | Status: DC

## 2023-06-05 MED ORDER — POTASSIUM CHLORIDE CRYS ER 20 MEQ PO TBCR
20.0000 meq | EXTENDED_RELEASE_TABLET | Freq: Every day | ORAL | 3 refills | Status: DC
Start: 2023-06-05 — End: 2023-06-07

## 2023-06-05 MED ORDER — SACUBITRIL-VALSARTAN 24-26 MG PO TABS: 1.0000 | ORAL_TABLET | Freq: Two times a day (BID) | ORAL | 3 refills | Status: DC

## 2023-06-05 MED ORDER — ATORVASTATIN CALCIUM 80 MG PO TABS: 80.0000 mg | ORAL_TABLET | Freq: Every day | ORAL | 3 refills | Status: DC

## 2023-06-05 MED ORDER — SPIRONOLACTONE 25 MG PO TABS: 12.5000 mg | ORAL_TABLET | Freq: Every day | ORAL | 3 refills | Status: DC

## 2023-06-05 MED ORDER — ISOSORBIDE MONONITRATE ER 30 MG PO TB24: 15.0000 mg | ORAL_TABLET | Freq: Every day | ORAL | 3 refills | Status: DC

## 2023-06-05 NOTE — Progress Notes (Unsigned)
 Cardiology Office Note   Date:  06/05/2023   ID:  Dabney Schanz, DOB 03-15-1966, MRN [id number]  PCP:  Mliss Sax, MD  Cardiologist:  Dr. Jens Som  No chief complaint on file.     History of Present Illness: Billy [name] is a 58 y.o. male who is here today for follow-up visit regarding peripheral arterial disease.  The patient had previous cardiac arrest in November 2010 status post ICD placement.  He had previous CABG.  Other medical problems include hypertension, previous tobacco use , PAD and hyperlipidemia.  He had previous left SFA stent many years ago.    He had unstable angina in October 2020.  Cardiac catheterization showed severe native 2-vessel coronary artery disease.  SVG to RCA and SVG to OM 2 were known to be occluded.  LIMA to LAD was found to be atretic with competitive flow due to minimal disease in the native LAD.  SVG to first diagonal was patent.  There was 80% ostial left circumflex stenosis which was treated with scoring balloon angioplasty.    He had worsening right calf claudication in 2021.  Angiography was performed in February 2021 which showed  severe stenosis affecting the proximal right external iliac artery with occluded mid SFA with reconstitution distally via collaterals from the profunda.  I performed successful self-expanding stent placement to the right external iliac artery.  He had issues with recurrent ICD shocks in 2022.  He underwent cardiac catheterization in February 2022 which showed significant multivessel CAD with chronic occlusion of first diagonal, mild LAD disease, patent ostial left circumflex, chronic occlusion of the mid AV groove left circumflex with collaterals and chronically occluded right coronary artery with left-to-right collaterals.  LIMA to LAD was atretic as native LAD had no obstructive disease.  He had left hip surgery at [facility] which required revision twice.   Most recent Doppler study in March of 2023 showed an  ABI of 0.85 on the right and 0.82 on the left.  Right external iliac artery stent was patent with no significant restenosis.  He has been doing reasonably well and denies chest pain or worsening dyspnea.  No significant lower extremity claudication.  He resumed taking Plavix once daily as he felt prior to taking it.  No bleeding issues.  Past Medical History:  Diagnosis Date   Aortic atherosclerosis ([name])    on CXR   Blood in stool    C. difficile colitis    a. remote hx 2010.   Cardiac arrest - ventricular fibrillation 02/11/2009   a. 02/2009 s/p St Jude ICD.   Chronic combined systolic and diastolic CHF (congestive heart failure) ([name])    Chronic kidney disease, stage 3a ([name])    Coronary artery disease    a. PCI to Cx age 5. b. CABGx4 in 2003. c. BMS to OM1 in 12/2007. d. PTCA to Cx 01/2019.   Former tobacco use    Hyperlipidemia    Hypertension    Ischemic cardiomyopathy    Marijuana abuse    Myocardial infarct ([name])    NON-ST-SEGMENT ELEVATION MI   Obesity    PAD (peripheral artery disease) ([name])    PAF (paroxysmal atrial fibrillation) ([name])    Pathologic fracture of left acetabulum 07/03/2021   PSVT (paroxysmal supraventricular tachycardia) ([name])    Ventricular tachycardia ([name])     Past Surgical History:  Procedure Laterality Date   ABDOMINAL AORTOGRAM W/LOWER EXTREMITY N/A 05/14/2019   Procedure: ABDOMINAL AORTOGRAM W/LOWER EXTREMITY;  Surgeon: Lorine Bears  A, MD;  Location: [facility] INVASIVE CV LAB;  Service: Cardiovascular;  Laterality: N/A;   CARDIAC DEFIBRILLATOR PLACEMENT     STJ single chamber ICD implanted for secondary prevention   CIRCUMCISION     COLONOSCOPY WITH PROPOFOL N/A 01/11/2023   Procedure: COLONOSCOPY WITH PROPOFOL;  Surgeon: Meryl Dare, MD;  Location: [facility];  Service: Gastroenterology;  Laterality: N/A;   CORONARY ARTERY BYPASS GRAFT  06/17/01   X 4   CORONARY BALLOON ANGIOPLASTY N/A 01/22/2019   Procedure: CORONARY BALLOON ANGIOPLASTY;   Surgeon: Marykay Lex, MD;  Location: [facility] INVASIVE CV LAB;  Service: Cardiovascular;  Laterality: N/A;   HEMOSTASIS CLIP PLACEMENT  01/11/2023   Procedure: HEMOSTASIS CLIP PLACEMENT;  Surgeon: Meryl Dare, MD;  Location: [facility];  Service: Gastroenterology;;   HIP CLOSED REDUCTION Left 07/02/2021   Procedure: CLOSED REDUCTION HIP with placement of skeletal traction;  Surgeon: Jones Broom, MD;  Location: [facility] OR;  Service: Orthopedics;  Laterality: Left;   ICD GENERATOR CHANGEOUT N/A 06/17/2021   Procedure: ICD GENERATOR CHANGEOUT;  Surgeon: Duke Salvia, MD;  Location: [facility] INVASIVE CV LAB;  Service: Cardiovascular;  Laterality: N/A;   LEFT HEART CATH AND CORS/GRAFTS ANGIOGRAPHY N/A 01/22/2019   Procedure: LEFT HEART CATH AND CORS/GRAFTS ANGIOGRAPHY;  Surgeon: Marykay Lex, MD;  Location: [facility] INVASIVE CV LAB;  Service: Cardiovascular;  Laterality: N/A;   LEFT HEART CATH AND CORS/GRAFTS ANGIOGRAPHY N/A 05/18/2020   Procedure: LEFT HEART CATH AND CORS/GRAFTS ANGIOGRAPHY;  Surgeon: Lennette Bihari, MD;  Location: [facility];  Service: Cardiovascular;  Laterality: N/A;   OPEN REDUCTION INTERNAL FIXATION ACETABULUM FRACTURE POSTERIOR Left 07/04/2021   Procedure: OPEN REDUCTION INTERNAL FIXATION ACETABULUM FRACTURE POSTERIOR;  Surgeon: Myrene Galas, MD;  Location: [facility] OR;  Service: Orthopedics;  Laterality: Left;   PERIPHERAL VASCULAR INTERVENTION Right 05/14/2019   Procedure: PERIPHERAL VASCULAR INTERVENTION;  Surgeon: Iran Ouch, MD;  Location: [facility];  Service: Cardiovascular;  Laterality: Right;   POLYPECTOMY  01/11/2023   Procedure: POLYPECTOMY;  Surgeon: Meryl Dare, MD;  Location: [facility];  Service: Gastroenterology;;   SVT ABLATION N/A 06/21/2020   Procedure: SVT ABLATION;  Surgeon: Marinus Maw, MD;  Location: [facility] INVASIVE CV LAB;  Service: Cardiovascular;  Laterality: N/A;     Current Outpatient Medications  Medication Sig Dispense  Refill   alum & mag hydroxide-simeth (MAALOX MAX) 400-400-40 MG/5ML suspension Take 10 mLs by mouth every 6 (six) hours as needed for indigestion. 355 mL 0   apixaban (ELIQUIS) 5 MG TABS tablet Take 1 tablet (5 mg total) by mouth 2 (two) times daily. 180 tablet 3   atorvastatin (LIPITOR) 80 MG tablet Take 1 tablet (80 mg total) by mouth daily. 90 tablet 3   carvedilol (COREG) 3.125 MG tablet Take 1 tablet (3.125 mg total) by mouth 2 (two) times daily with a meal. 180 tablet 3   clopidogrel (PLAVIX) 75 MG tablet Take 1 tablet (75 mg total) by mouth daily. 90 tablet 3   empagliflozin (JARDIANCE) 10 MG TABS tablet Take 1 tablet (10 mg total) by mouth daily. 90 tablet 3   ezetimibe (ZETIA) 10 MG tablet Take 1 tablet (10 mg total) by mouth daily. 90 tablet 3   famotidine (PEPCID) 40 MG tablet Take 1 tablet (40 mg total) by mouth at bedtime. (Patient not taking: Reported on 06/05/2023) 30 tablet 11   fluticasone (FLONASE) 50 MCG/ACT nasal spray Place 2 sprays into both nostrils daily as needed for  allergies or rhinitis. 16 g 11   furosemide (LASIX) 40 MG tablet Take 1 tablet (40 mg total) by mouth daily. 90 tablet 3   Iron, Ferrous Sulfate, 325 (65 Fe) MG TABS Take 325 mg by mouth daily. (Patient not taking: Reported on 06/05/2023) 270 tablet 1   isosorbide mononitrate (IMDUR) 30 MG 24 hr tablet Take 0.5 tablets (15 mg total) by mouth daily. 90 tablet 3   levothyroxine (SYNTHROID) 50 MCG tablet TAKE 1 TABLET BY MOUTH DAILY BEFORE BREAKFAST. 30 MINUTES BEFORE HAVING BREAKFAST. 90 tablet 0   montelukast (SINGULAIR) 10 MG tablet TAKE 1 TABLET EVERY DAY AS NEEDED FOR ALLERGIES 90 tablet 1   nitroGLYCERIN (NITROSTAT) 0.4 MG SL tablet Place 1 tablet (0.4 mg total) under the tongue every 5 (five) minutes as needed for chest pain. 25 tablet 11   pantoprazole (PROTONIX) 40 MG tablet Take 1 tablet (40 mg total) by mouth daily. 90 tablet 3   polyethylene glycol (MIRALAX / GLYCOLAX) 17 g packet Take 17 g by mouth daily.  14 each 0   potassium chloride SA (KLOR-CON M) 20 MEQ tablet Take 1 tablet (20 mEq total) by mouth daily. 90 tablet 3   promethazine-dextromethorphan (PROMETHAZINE-DM) 6.25-15 MG/5ML syrup Take 5 mLs by mouth 4 (four) times daily as needed for cough. 118 mL 0   sacubitril-valsartan (ENTRESTO) 24-26 MG Take 1 tablet by mouth 2 (two) times daily. 180 tablet 3   spironolactone (ALDACTONE) 25 MG tablet Take 0.5 tablets (12.5 mg total) by mouth daily. 45 tablet 3   No current facility-administered medications for this visit.    Allergies:   Patient has no known allergies.    Social History:  The patient  reports that he quit smoking about 4 years ago. His smoking use included cigarettes. He started smoking about 24 years ago. He has a 40 pack-year smoking history. He has never been exposed to tobacco smoke. He has never used smokeless tobacco. He reports that he does not drink alcohol and does not use drugs.   Family History:  The patient's family history includes Cancer in his paternal uncle and paternal uncle; Heart attack in his father; Heart disease in his sister; Hypertension in his mother and another family member.    ROS:  Please see the history of present illness.   Otherwise, review of systems are positive for none.   All other systems are reviewed and negative.    PHYSICAL EXAM: VS:  BP 112/84   Pulse 68   Ht 5\' 11"  (1.803 m)   Wt 261 lb (118.4 kg)   BMI 36.40 kg/m  , BMI Body mass index is 36.4 kg/m. GEN: Well nourished, well developed, in no acute distress  HEENT: normal  Neck: no JVD, carotid bruits, or masses Cardiac: RRR; no murmurs, rubs, or gallops,no edema  Respiratory:  clear to auscultation bilaterally, normal work of breathing GI: soft, nontender, nondistended, + BS MS: no deformity or atrophy  Skin: warm and dry, no rash Neuro:  Strength and sensation are intact Psych: euthymic mood, full affect   EKG:  EKG is ordered today. EKG showed normal sinus rhythm  with old inferior infarct.   Recent Labs: 06/10/2022: ALT 20; Magnesium 1.8 12/28/2022: Hemoglobin 18.5; Platelet Count 271 05/11/2023: BUN 9; Creatinine, Ser 1.16; Potassium 4.0; Sodium 143; TSH 2.47    Lipid Panel    Component Value Date/Time   CHOL 129 05/11/2023 1115   CHOL 118 12/23/2021 0818   TRIG 132.0 05/11/2023 1115  HDL 37.10 (L) 05/11/2023 1115   HDL 32 (L) 12/23/2021 0818   CHOLHDL 3 05/11/2023 1115   VLDL 26.4 05/11/2023 1115   LDLCALC 65 05/11/2023 1115   LDLCALC 65 12/23/2021 0818   LDLDIRECT 95.0 11/29/2020 1018      Wt Readings from Last 3 Encounters:  06/05/23 261 lb (118.4 kg)  06/05/23 261 lb (118.4 kg)  06/04/23 261 lb (118.4 kg)           No data to display            ASSESSMENT AND PLAN:  1.  Peripheral arterial disease right lower extremity claudication: Status post stent placement to the right external iliac artery with .  He has known occlusion of the right SFA .  He currently has no claudication and most recent Doppler showed patent right external iliac artery stent with no restenosis.  I requested repeat ABI and aortoiliac duplex. During last visit, I discontinued clopidogrel given that he was anemic and he is on long-term anticoagulation with Eliquis.  He resumed Plavix as he felt better when he was taking it.  Will check CBC today.  If there is no evidence of anemia, it is reasonable to continue Plavix with Eliquis given his extensive peripheral arterial disease and also coronary artery disease.  2.  Coronary artery disease involving native coronary arteries without angina: Most recent catheterization showed no new obstructive disease.   3.  Chronic systolic heart failure: He appears to be euvolemic and currently on optimal medical therapy.    4.  Hyperlipidemia: Continue treatment with atorvastatin and Zetia with a target LDL of less than 70.  5.  Paroxysmal atrial fibrillation: He is in sinus rhythm.  He is currently on anticoagulation  with Eliquis.  7.  Recurrent ICD shocks: No recent episodes.  He is no longer on amiodarone.   Disposition: Follow-up with me in 12 months.  Signed,  Lorine Bears, MD  06/05/2023 4:40 PM    [facility]

## 2023-06-05 NOTE — Patient Instructions (Addendum)
 Medication Instructions:   START SPIRONOLACTONE 12.5 MG ONCE DAILY=1/2 OF THE 25 MG TABLET ONCE DAILY  Refill sent to the pharmacy electronically.   *If you need a refill on your cardiac medications before your next appointment, please call your pharmacy*   Lab Work:  Your physician recommends that you return for lab work in: ONE WEEK-DO NOT NEED TO FAST  If you have labs (blood work) drawn today and your tests are completely normal, you will receive your results only by: MyChart Message (if you have MyChart) OR A paper copy in the mail If you have any lab test that is abnormal or we need to change your treatment, we will call you to review the results.  Your physician has requested that you have an echocardiogram. Echocardiography is a painless test that uses sound waves to create images of your heart. It provides your doctor with information about the size and shape of your heart and how well your heart's chambers and valves are working. This procedure takes approximately one hour. There are no restrictions for this procedure. Please do NOT wear cologne, perfume, aftershave, or lotions (deodorant is allowed). Please arrive 15 minutes prior to your appointment time.  Please note: We ask at that you not bring children with you during ultrasound (echo/ vascular) testing. Due to room size and safety concerns, children are not allowed in the ultrasound rooms during exams. Our front office staff cannot provide observation of children in our lobby area while testing is being conducted. An adult accompanying a patient to their appointment will only be allowed in the ultrasound room at the discretion of the ultrasound technician under special circumstances. We apologize for any inconvenience. [address]  Follow-Up: At [facility], you and your health needs are our priority.  As part of our continuing mission to provide you with exceptional heart care, we have created  designated Provider Care Teams.  These Care Teams include your primary Cardiologist (physician) and Advanced Practice Providers (APPs -  Physician Assistants and Nurse Practitioners) who all work together to provide you with the care you need, when you need it.    Your next appointment:   6 month(s)  Provider:   Olga Millers, MD

## 2023-06-05 NOTE — Patient Instructions (Signed)
 Medication Instructions:  No changes *If you need a refill on your cardiac medications before your next appointment, please call your pharmacy*   Lab Work: None ordered If you have labs (blood work) drawn today and your tests are completely normal, you will receive your results only by: MyChart Message (if you have MyChart) OR A paper copy in the mail If you have any lab test that is abnormal or we need to change your treatment, we will call you to review the results.   Testing/Procedures: Your physician has requested that you have an Aorta/Iliac Duplex. This will be take place at [address] [facility], [address].  No food after 11PM the night before.  Water is OK. (Don't drink liquids if you have been instructed not to for ANOTHER test) Avoid foods that produce bowel gas, for 24 hours prior to exam (see below). No breakfast, no chewing gum, no smoking or carbonated beverages. Patient may take morning medications with water. Come in for test at least 15 minutes early to register.  Your physician has requested that you have an ankle brachial index (ABI). During this test an ultrasound and blood pressure cuff are used to evaluate the arteries that supply the arms and legs with blood. Allow thirty minutes for this exam. There are no restrictions or special instructions. This will take place at [address] [facility] [address] [facility], [address].   Please note: We ask at that you not bring children with you during ultrasound (echo/ vascular) testing. Due to room size and safety concerns, children are not allowed in the ultrasound rooms during exams. Our front office staff cannot provide observation of children in our lobby area while testing is being conducted. An adult accompanying a patient to their appointment will only be allowed in the ultrasound room at the discretion of the ultrasound technician under special circumstances. We apologize for any inconvenience.    Follow-Up: At [facility], you and  your health needs are our priority.  As part of our continuing mission to provide you with exceptional heart care, we have created designated Provider Care Teams.  These Care Teams include your primary Cardiologist (physician) and Advanced Practice Providers (APPs -  Physician Assistants and Nurse Practitioners) who all work together to provide you with the care you need, when you need it.  We recommend signing up for the patient portal called "MyChart".  Sign up information is provided on this After Visit Summary.  MyChart is used to connect with patients for Virtual Visits (Telemedicine).  Patients are able to view lab/test results, encounter notes, upcoming appointments, etc.  Non-urgent messages can be sent to your provider as well.   To learn more about what you can do with MyChart, go to [email or web address].    Your next appointment:   12 month(s)  Provider:   Dr. Kirke Corin  Other Instructions A referral has been placed to Vascular rehab. They will call you to set this up.

## 2023-06-07 ENCOUNTER — Other Ambulatory Visit: Payer: Self-pay

## 2023-06-07 MED ORDER — POTASSIUM CHLORIDE CRYS ER 20 MEQ PO TBCR: 20.0000 meq | EXTENDED_RELEASE_TABLET | Freq: Every day | ORAL | 3 refills | Status: DC

## 2023-06-07 MED ORDER — SPIRONOLACTONE 25 MG PO TABS: 12.5000 mg | ORAL_TABLET | Freq: Every day | ORAL | 3 refills | Status: AC

## 2023-06-08 ENCOUNTER — Other Ambulatory Visit: Payer: Self-pay

## 2023-06-08 ENCOUNTER — Other Ambulatory Visit: Payer: Self-pay | Admitting: Family Medicine

## 2023-06-08 DIAGNOSIS — E039 Hypothyroidism, unspecified: Secondary | ICD-10-CM

## 2023-06-08 DIAGNOSIS — J302 Other seasonal allergic rhinitis: Secondary | ICD-10-CM

## 2023-06-08 MED ORDER — FLUTICASONE PROPIONATE 50 MCG/ACT NA SUSP: 2.0000 | Freq: Every day | NASAL | 11 refills | Status: DC | PRN

## 2023-06-12 ENCOUNTER — Telehealth (HOSPITAL_COMMUNITY): Payer: Self-pay

## 2023-06-12 NOTE — Telephone Encounter (Signed)
 Received referral from Dr. Lorine Bears for this pt to participate in the supervised exercise therapy program with the diagnosis of Atherosclerosis of the native arteries of both legs with intermittent claudication.    Pt seen in follow up on 06/05/23.The patient received information regarding cardiovascular disease and PAD risk factor reduction, which could include education, counseling, behavior interventions, and outcome assessments.  Pt had ABI completed on 05/29/22 which showed moderate disease on the right and left which was recorded at 0.79 and 0.64 respectively.  Will send to support staff for verification of insurance and benefits and call patient on later date to schedule orientation if interested.   Faustino Congress MS, ACSM-CEP 06/12/2023 9:02 AM

## 2023-06-13 DIAGNOSIS — I4901 Ventricular fibrillation: Secondary | ICD-10-CM | POA: Diagnosis not present

## 2023-06-13 LAB — CBC

## 2023-06-14 ENCOUNTER — Encounter: Payer: Self-pay | Admitting: *Deleted

## 2023-06-14 LAB — COMPREHENSIVE METABOLIC PANEL
ALT: 19 IU/L (ref 0–44)
AST: 18 IU/L (ref 0–40)
Albumin: 4.4 g/dL (ref 3.8–4.9)
Alkaline Phosphatase: 133 IU/L — ABNORMAL HIGH (ref 44–121)
BUN/Creatinine Ratio: 13 (ref 9–20)
BUN: 16 mg/dL (ref 6–24)
Bilirubin Total: 0.8 mg/dL (ref 0.0–1.2)
CO2: 23 mmol/L (ref 20–29)
Calcium: 9.7 mg/dL (ref 8.7–10.2)
Chloride: 105 mmol/L (ref 96–106)
Creatinine, Ser: 1.26 mg/dL (ref 0.76–1.27)
Globulin, Total: 3 g/dL (ref 1.5–4.5)
Glucose: 97 mg/dL (ref 70–99)
Potassium: 4.9 mmol/L (ref 3.5–5.2)
Sodium: 143 mmol/L (ref 134–144)
Total Protein: 7.4 g/dL (ref 6.0–8.5)
eGFR: 67 mL/min/{1.73_m2} (ref >59)

## 2023-06-14 LAB — CBC
Hematocrit: 56.4 % — ABNORMAL HIGH (ref 37.5–51.0)
Hemoglobin: 18.4 g/dL — ABNORMAL HIGH (ref 13.0–17.7)
MCH: 27.5 pg (ref 26.6–33.0)
MCHC: 32.6 g/dL (ref 31.5–35.7)
MCV: 84 fL (ref 79–97)
Platelets: 305 10*3/uL (ref 150–450)
RBC: 6.68 x10E6/uL — ABNORMAL HIGH (ref 4.14–5.80)
RDW: 16.7 % — ABNORMAL HIGH (ref 11.6–15.4)
WBC: 8.7 10*3/uL (ref 3.4–10.8)

## 2023-06-15 ENCOUNTER — Telehealth (HOSPITAL_COMMUNITY): Payer: Self-pay

## 2023-06-15 NOTE — Telephone Encounter (Signed)
 Attempted to call pt in regards to SET program and scheduling. No answer, will try again on later date.

## 2023-06-15 NOTE — Telephone Encounter (Signed)
 Pt insurance is active and benefits verified through [facility]. Co-pay $25.00, DED $0.00/$0.00 met, out of pocket $9,350.00/$0.00 met, co-insurance 0%. No pre-authorization required. Mady Y./[name], 06/15/23 @ 1:55PM, EAV#[id number]

## 2023-06-17 ENCOUNTER — Other Ambulatory Visit: Payer: Self-pay | Admitting: Cardiology

## 2023-06-17 DIAGNOSIS — E7800 Pure hypercholesterolemia, unspecified: Secondary | ICD-10-CM

## 2023-06-18 ENCOUNTER — Encounter (HOSPITAL_COMMUNITY): Payer: Self-pay

## 2023-06-18 ENCOUNTER — Telehealth (HOSPITAL_COMMUNITY): Payer: Self-pay

## 2023-06-18 ENCOUNTER — Ambulatory Visit (INDEPENDENT_AMBULATORY_CARE_PROVIDER_SITE_OTHER): Payer: Medicare HMO

## 2023-06-18 DIAGNOSIS — I255 Ischemic cardiomyopathy: Secondary | ICD-10-CM | POA: Diagnosis not present

## 2023-06-18 DIAGNOSIS — I5022 Chronic systolic (congestive) heart failure: Secondary | ICD-10-CM

## 2023-06-18 NOTE — Telephone Encounter (Signed)
 Called pt to inform him about the SET program. No answer, left VM.

## 2023-06-20 DIAGNOSIS — D751 Secondary polycythemia: Secondary | ICD-10-CM | POA: Insufficient documentation

## 2023-06-20 LAB — CUP PACEART REMOTE DEVICE CHECK
Battery Remaining Longevity: 105 mo
Battery Remaining Percentage: 84 %
Battery Voltage: 3.01 V
Brady Statistic RV Percent Paced: 1 %
Date Time Interrogation Session: 20250318163920
HighPow Impedance: 64 Ohm
Implantable Lead Connection Status: 753985
Implantable Lead Implant Date: 20101111
Implantable Lead Location: 753860
Implantable Lead Model: 7121
Implantable Pulse Generator Implant Date: 20230317
Lead Channel Impedance Value: 550 Ohm
Lead Channel Pacing Threshold Amplitude: 1 V
Lead Channel Pacing Threshold Pulse Width: 0.5 ms
Lead Channel Sensing Intrinsic Amplitude: 12 mV
Lead Channel Setting Pacing Amplitude: 2.5 V
Lead Channel Setting Pacing Pulse Width: 0.5 ms
Lead Channel Setting Sensing Sensitivity: 0.5 mV
Pulse Gen Serial Number: 210001302
Zone Setting Status: 755011

## 2023-06-20 NOTE — Assessment & Plan Note (Signed)
 likely from untreated OSA -He had normal CBC In 2009, became abnormal after Vfib arrest in 02/2009. He has had mild intermittent leukocytosis, anemia, microcytosis, and erythrocytosis since then, exacerbated during hospital admissions for hip fracture and other acute conditions -He quit smoking 3 years ago, after smoking 2 PPD x20 years. He may have underlying lung disease. -No history of malignancy or abnormal findings on recent CT AP 06/10/22; no splenomegaly -Epo was slightly elevated which is nonspecific; JAK 2 normal; this is not MPN/PV. He does not need phlebotomy -This is likely secondary to OSA. He showed me sleep study results from 08/2022 with moderate OSA score 16.2 and lowest spO2 77%.

## 2023-06-21 ENCOUNTER — Inpatient Hospital Stay (HOSPITAL_BASED_OUTPATIENT_CLINIC_OR_DEPARTMENT_OTHER): Payer: Medicare HMO | Admitting: Hematology

## 2023-06-21 ENCOUNTER — Inpatient Hospital Stay: Payer: Medicare HMO | Attending: Hematology

## 2023-06-21 ENCOUNTER — Other Ambulatory Visit: Payer: Medicare HMO

## 2023-06-21 ENCOUNTER — Ambulatory Visit: Payer: Medicare HMO | Admitting: Nurse Practitioner

## 2023-06-21 VITALS — BP 114/72 | HR 86 | Temp 97.8°F | Resp 21 | Ht 71.0 in | Wt 261.4 lb

## 2023-06-21 DIAGNOSIS — Z79899 Other long term (current) drug therapy: Secondary | ICD-10-CM | POA: Diagnosis not present

## 2023-06-21 DIAGNOSIS — D751 Secondary polycythemia: Secondary | ICD-10-CM

## 2023-06-21 DIAGNOSIS — D649 Anemia, unspecified: Secondary | ICD-10-CM | POA: Insufficient documentation

## 2023-06-21 DIAGNOSIS — G4733 Obstructive sleep apnea (adult) (pediatric): Secondary | ICD-10-CM | POA: Insufficient documentation

## 2023-06-21 DIAGNOSIS — E611 Iron deficiency: Secondary | ICD-10-CM

## 2023-06-21 LAB — CBC WITH DIFFERENTIAL (CANCER CENTER ONLY)
Abs Immature Granulocytes: 0.13 10*3/uL — ABNORMAL HIGH (ref 0.00–0.07)
Basophils Absolute: 0.1 10*3/uL (ref 0.0–0.1)
Basophils Relative: 1 %
Eosinophils Absolute: 0.2 10*3/uL (ref 0.0–0.5)
Eosinophils Relative: 2 %
HCT: 52.2 % — ABNORMAL HIGH (ref 39.0–52.0)
Hemoglobin: 17.9 g/dL — ABNORMAL HIGH (ref 13.0–17.0)
Immature Granulocytes: 1 %
Lymphocytes Relative: 32 %
Lymphs Abs: 3.7 10*3/uL (ref 0.7–4.0)
MCH: 26.9 pg (ref 26.0–34.0)
MCHC: 34.3 g/dL (ref 30.0–36.0)
MCV: 78.5 fL — ABNORMAL LOW (ref 80.0–100.0)
Monocytes Absolute: 1.2 10*3/uL — ABNORMAL HIGH (ref 0.1–1.0)
Monocytes Relative: 10 %
Neutro Abs: 6 10*3/uL (ref 1.7–7.7)
Neutrophils Relative %: 54 %
Platelet Count: 266 10*3/uL (ref 150–400)
RBC: 6.65 MIL/uL — ABNORMAL HIGH (ref 4.22–5.81)
RDW: 16.9 % — ABNORMAL HIGH (ref 11.5–15.5)
WBC Count: 11.3 10*3/uL — ABNORMAL HIGH (ref 4.0–10.5)
nRBC: 0 % (ref 0.0–0.2)

## 2023-06-21 LAB — FERRITIN: Ferritin: 152 ng/mL (ref 24–336)

## 2023-06-21 LAB — IRON AND IRON BINDING CAPACITY (CC-WL,HP ONLY)
Iron: 94 ug/dL (ref 45–182)
Saturation Ratios: 24 % (ref 17.9–39.5)
TIBC: 398 ug/dL (ref 250–450)
UIBC: 304 ug/dL (ref 117–376)

## 2023-06-21 NOTE — Progress Notes (Signed)
 [facility]   [phone] [phone]   Clinic Follow up Note   Patient Care Team: Mliss Sax, MD as PCP - General (Family Medicine) Jens Som Madolyn Frieze, MD as PCP - Cardiology (Cardiology) Duke Salvia, MD as PCP - Electrophysiology (Cardiology) Janalyn Harder, MD (Inactive) as Consulting Physician (Dermatology) Pollyann Samples, NP as Nurse Practitioner (Hematology and Oncology)  Date of Service:  06/21/2023  CHIEF COMPLAINT: f/u of hyperlipidemia  CURRENT THERAPY:  Observation  Oncology History   Polycythemia, secondary likely from untreated OSA -He had normal CBC In 2009, became abnormal after Vfib arrest in 02/2009. He has had mild intermittent leukocytosis, anemia, microcytosis, and erythrocytosis since then, exacerbated during hospital admissions for hip fracture and other acute conditions -He quit smoking 3 years ago, after smoking 2 PPD x20 years. He may have underlying lung disease. -No history of malignancy or abnormal findings on recent CT AP 06/10/22; no splenomegaly -Epo was slightly elevated which is nonspecific; JAK 2 normal; this is not MPN/PV. He does not need phlebotomy -This is likely secondary to OSA. He showed me sleep study results from 08/2022 with moderate OSA score 16.2 and lowest spO2 77%.     Assessment and Plan    Secondary Polycythemia Secondary polycythemia likely due to sleep apnea, indicated by elevated red blood cell counts. Hematocrit levels increased from normal ranges last year to 54-56% since September, now at 52%. Not polycythemia vera, confirmed by previous genetic testing. Occasional pruritus after hot showers, a common symptom. Related to nocturnal hypoxemia, prompting increased erythropoiesis. CPAP therapy expected to improve oxygenation and reduce erythrocytosis. On anticoagulation therapy with Eliquis and Plavix, providing thromboembolic protection, making additional aspirin unnecessary. -  Initiate CPAP therapy to improve nocturnal oxygenation. - Monitor blood counts in six months to assess changes. - Ensure adequate hydration to prevent hemoconcentration. - Avoid iron supplementation to prevent further erythrocytosis. - Continue anticoagulation therapy with Eliquis and Plavix.  Sleep Apnea Sleep apnea contributing to secondary polycythemia. Awaiting CPAP machine delivery to begin treatment. Causes nocturnal hypoxemia, leading to increased erythropoiesis. CPAP therapy expected to improve oxygenation and reduce erythrocytosis. - Start using CPAP machine once received to improve nocturnal oxygenation.  Plan -I strongly encouraged him to use CPAP machine, which would likely improve his polycythemia -No phlebotomy/treatment needed -Follow-up as needed    Discussed the use of AI scribe software for clinical note transcription with the patient, who gave verbal consent to proceed.  History of Present Illness   The patient, a 58 year old gentleman with a history of sleep apnea and a heart condition, presents for follow-up of secondary polycythemia. The patient was referred by his primary care physician due to an elevated red blood cell count. The patient reports no new symptoms or changes in his condition since his last visit. He does, however, express confusion about the reason for his referral, as he does not have cancer. The patient acknowledges occasional skin itchiness after hot showers but does not consider it severe. He is awaiting a CPAP machine for his sleep apnea, which is believed to be contributing to his elevated red blood cell count. The patient is on blood thinners for his heart condition and was previously on iron pills, which have since been discontinued.         All other systems were reviewed with the patient and are negative.  MEDICAL HISTORY:  Past Medical History:  Diagnosis Date   Aortic atherosclerosis ([name])    on CXR   Blood  in stool    C. difficile  colitis    a. remote hx 2010.   Cardiac arrest - ventricular fibrillation 02/11/2009   a. 02/2009 s/p St Jude ICD.   Chronic combined systolic and diastolic CHF (congestive heart failure) ([name])    Chronic kidney disease, stage 3a ([name])    Coronary artery disease    a. PCI to Cx age 69. b. CABGx4 in 2003. c. BMS to OM1 in 12/2007. d. PTCA to Cx 01/2019.   Former tobacco use    Hyperlipidemia    Hypertension    Ischemic cardiomyopathy    Marijuana abuse    Myocardial infarct ([name])    NON-ST-SEGMENT ELEVATION MI   Obesity    PAD (peripheral artery disease) ([name])    PAF (paroxysmal atrial fibrillation) ([name])    Pathologic fracture of left acetabulum 07/03/2021   PSVT (paroxysmal supraventricular tachycardia) ([name])    Ventricular tachycardia ([name])     SURGICAL HISTORY: Past Surgical History:  Procedure Laterality Date   ABDOMINAL AORTOGRAM W/LOWER EXTREMITY N/A 05/14/2019   Procedure: ABDOMINAL AORTOGRAM W/LOWER EXTREMITY;  Surgeon: Iran Ouch, MD;  Location: [facility] INVASIVE CV LAB;  Service: Cardiovascular;  Laterality: N/A;   CARDIAC DEFIBRILLATOR PLACEMENT     STJ single chamber ICD implanted for secondary prevention   CIRCUMCISION     COLONOSCOPY WITH PROPOFOL N/A 01/11/2023   Procedure: COLONOSCOPY WITH PROPOFOL;  Surgeon: Meryl Dare, MD;  Location: [facility] ENDOSCOPY;  Service: Gastroenterology;  Laterality: N/A;   CORONARY ARTERY BYPASS GRAFT  06/17/01   X 4   CORONARY BALLOON ANGIOPLASTY N/A 01/22/2019   Procedure: CORONARY BALLOON ANGIOPLASTY;  Surgeon: Marykay Lex, MD;  Location: [facility] INVASIVE CV LAB;  Service: Cardiovascular;  Laterality: N/A;   HEMOSTASIS CLIP PLACEMENT  01/11/2023   Procedure: HEMOSTASIS CLIP PLACEMENT;  Surgeon: Meryl Dare, MD;  Location: [facility];  Service: Gastroenterology;;   HIP CLOSED REDUCTION Left 07/02/2021   Procedure: CLOSED REDUCTION HIP with placement of skeletal traction;  Surgeon: Jones Broom, MD;  Location: [facility] OR;   Service: Orthopedics;  Laterality: Left;   ICD GENERATOR CHANGEOUT N/A 06/17/2021   Procedure: ICD GENERATOR CHANGEOUT;  Surgeon: Duke Salvia, MD;  Location: [facility] INVASIVE CV LAB;  Service: Cardiovascular;  Laterality: N/A;   LEFT HEART CATH AND CORS/GRAFTS ANGIOGRAPHY N/A 01/22/2019   Procedure: LEFT HEART CATH AND CORS/GRAFTS ANGIOGRAPHY;  Surgeon: Marykay Lex, MD;  Location: [facility] INVASIVE CV LAB;  Service: Cardiovascular;  Laterality: N/A;   LEFT HEART CATH AND CORS/GRAFTS ANGIOGRAPHY N/A 05/18/2020   Procedure: LEFT HEART CATH AND CORS/GRAFTS ANGIOGRAPHY;  Surgeon: Lennette Bihari, MD;  Location: [facility] INVASIVE CV LAB;  Service: Cardiovascular;  Laterality: N/A;   OPEN REDUCTION INTERNAL FIXATION ACETABULUM FRACTURE POSTERIOR Left 07/04/2021   Procedure: OPEN REDUCTION INTERNAL FIXATION ACETABULUM FRACTURE POSTERIOR;  Surgeon: Myrene Galas, MD;  Location: [facility] OR;  Service: Orthopedics;  Laterality: Left;   PERIPHERAL VASCULAR INTERVENTION Right 05/14/2019   Procedure: PERIPHERAL VASCULAR INTERVENTION;  Surgeon: Iran Ouch, MD;  Location: [facility] INVASIVE CV LAB;  Service: Cardiovascular;  Laterality: Right;   POLYPECTOMY  01/11/2023   Procedure: POLYPECTOMY;  Surgeon: Meryl Dare, MD;  Location: [facility];  Service: Gastroenterology;;   SVT ABLATION N/A 06/21/2020   Procedure: SVT ABLATION;  Surgeon: Marinus Maw, MD;  Location: [facility] INVASIVE CV LAB;  Service: Cardiovascular;  Laterality: N/A;    I have reviewed the social history and family history with the patient and they are  unchanged from previous note.  ALLERGIES:  has no known allergies.  MEDICATIONS:  Current Outpatient Medications  Medication Sig Dispense Refill   alum & mag hydroxide-simeth (MAALOX MAX) 400-400-40 MG/5ML suspension Take 10 mLs by mouth every 6 (six) hours as needed for indigestion. 355 mL 0   apixaban (ELIQUIS) 5 MG TABS tablet Take 1 tablet (5 mg total) by mouth 2 (two) times daily. 180 tablet 3    atorvastatin (LIPITOR) 80 MG tablet Take 1 tablet (80 mg total) by mouth daily. 90 tablet 3   carvedilol (COREG) 3.125 MG tablet Take 1 tablet (3.125 mg total) by mouth 2 (two) times daily with a meal. 180 tablet 3   clopidogrel (PLAVIX) 75 MG tablet Take 1 tablet (75 mg total) by mouth daily. 90 tablet 3   empagliflozin (JARDIANCE) 10 MG TABS tablet Take 1 tablet (10 mg total) by mouth daily. 90 tablet 3   ezetimibe (ZETIA) 10 MG tablet TAKE 1 TABLET EVERY DAY 90 tablet 3   famotidine (PEPCID) 40 MG tablet Take 1 tablet (40 mg total) by mouth at bedtime. 30 tablet 11   fluticasone (FLONASE) 50 MCG/ACT nasal spray Place 2 sprays into both nostrils daily as needed for allergies or rhinitis. 16 g 11   furosemide (LASIX) 40 MG tablet Take 1 tablet (40 mg total) by mouth daily. 90 tablet 3   isosorbide mononitrate (IMDUR) 30 MG 24 hr tablet Take 0.5 tablets (15 mg total) by mouth daily. 90 tablet 3   levothyroxine (SYNTHROID) 50 MCG tablet TAKE 1 TABLET BY MOUTH DAILY 30 MINUTES BEFORE HAVING BREAKFAST. 90 tablet 3   montelukast (SINGULAIR) 10 MG tablet TAKE 1 TABLET EVERY DAY AS NEEDED FOR ALLERGIES 90 tablet 1   nitroGLYCERIN (NITROSTAT) 0.4 MG SL tablet Place 1 tablet (0.4 mg total) under the tongue every 5 (five) minutes as needed for chest pain. 25 tablet 11   pantoprazole (PROTONIX) 40 MG tablet Take 1 tablet (40 mg total) by mouth daily. 90 tablet 3   polyethylene glycol (MIRALAX / GLYCOLAX) 17 g packet Take 17 g by mouth daily. 14 each 0   potassium chloride SA (KLOR-CON M) 20 MEQ tablet Take 1 tablet (20 mEq total) by mouth daily. 90 tablet 3   promethazine-dextromethorphan (PROMETHAZINE-DM) 6.25-15 MG/5ML syrup Take 5 mLs by mouth 4 (four) times daily as needed for cough. 118 mL 0   sacubitril-valsartan (ENTRESTO) 24-26 MG Take 1 tablet by mouth 2 (two) times daily. 180 tablet 3   spironolactone (ALDACTONE) 25 MG tablet Take 0.5 tablets (12.5 mg total) by mouth daily. 45 tablet 3   No  current facility-administered medications for this visit.    PHYSICAL EXAMINATION: ECOG PERFORMANCE STATUS: 1 - Symptomatic but completely ambulatory  Vitals:   06/21/23 1138  BP: 114/72  Pulse: 86  Resp: (!) 21  Temp: 97.8 F (36.6 C)  SpO2: 97%   Wt Readings from Last 3 Encounters:  06/21/23 261 lb 6.4 oz (118.6 kg)  06/05/23 261 lb (118.4 kg)  06/05/23 261 lb (118.4 kg)     GENERAL:alert, no distress and comfortable SKIN: skin color, texture, turgor are normal, no rashes or significant lesions EYES: normal, Conjunctiva are pink and non-injected, sclera clear Musculoskeletal:no cyanosis of digits and no clubbing  NEURO: alert & oriented x 3 with fluent speech, no focal motor/sensory deficits   LABORATORY DATA:  I have reviewed the data as listed    Latest Ref Rng & Units 06/21/2023   11:19 AM 06/13/2023  10:24 AM 12/28/2022   11:51 AM  CBC  WBC 4.0 - 10.5 K/uL 11.3  8.7  10.2   Hemoglobin 13.0 - 17.0 g/dL 25.3  66.4  40.3   Hematocrit 39.0 - 52.0 % 52.2  56.4  54.6   Platelets 150 - 400 K/uL 266  305  271         Latest Ref Rng & Units 06/13/2023   10:24 AM 05/11/2023   11:15 AM 10/06/2022   11:35 AM  CMP  Glucose 70 - 99 mg/dL 97  86  474   BUN 6 - 24 mg/dL 16  9  14    Creatinine 0.76 - 1.27 mg/dL 2.59  5.63  8.75   Sodium 134 - 144 mmol/L 143  143  137   Potassium 3.5 - 5.2 mmol/L 4.9  4.0  4.1   Chloride 96 - 106 mmol/L 105  105  104   CO2 20 - 29 mmol/L 23  27  25    Calcium 8.7 - 10.2 mg/dL 9.7  9.3  9.4   Total Protein 6.0 - 8.5 g/dL 7.4     Total Bilirubin 0.0 - 1.2 mg/dL 0.8     Alkaline Phos 44 - 121 IU/L 133     AST 0 - 40 IU/L 18     ALT 0 - 44 IU/L 19         RADIOGRAPHIC STUDIES: I have personally reviewed the radiological images as listed and agreed with the findings in the report. No results found.    No orders of the defined types were placed in this encounter.  All questions were answered. The patient knows to call the clinic with  any problems, questions or concerns. No barriers to learning was detected. The total time spent in the appointment was 20 minutes.     Malachy Mood, MD 06/21/2023

## 2023-06-24 ENCOUNTER — Other Ambulatory Visit: Payer: Self-pay | Admitting: Student

## 2023-06-25 ENCOUNTER — Other Ambulatory Visit: Payer: Self-pay

## 2023-06-25 ENCOUNTER — Encounter (HOSPITAL_BASED_OUTPATIENT_CLINIC_OR_DEPARTMENT_OTHER): Payer: Self-pay | Admitting: Emergency Medicine

## 2023-06-25 ENCOUNTER — Emergency Department (HOSPITAL_BASED_OUTPATIENT_CLINIC_OR_DEPARTMENT_OTHER)
Admission: EM | Admit: 2023-06-25 | Discharge: 2023-06-25 | Disposition: A | Discharge status: Home / Self Care | Attending: Emergency Medicine | Admitting: Emergency Medicine

## 2023-06-25 DIAGNOSIS — Z23 Encounter for immunization: Secondary | ICD-10-CM | POA: Diagnosis not present

## 2023-06-25 DIAGNOSIS — S6992XA Unspecified injury of left wrist, hand and finger(s), initial encounter: Secondary | ICD-10-CM | POA: Diagnosis present

## 2023-06-25 DIAGNOSIS — W231XXA Caught, crushed, jammed, or pinched between stationary objects, initial encounter: Secondary | ICD-10-CM | POA: Diagnosis not present

## 2023-06-25 DIAGNOSIS — S61217A Laceration without foreign body of left little finger without damage to nail, initial encounter: Secondary | ICD-10-CM | POA: Diagnosis not present

## 2023-06-25 DIAGNOSIS — Z7901 Long term (current) use of anticoagulants: Secondary | ICD-10-CM | POA: Diagnosis not present

## 2023-06-25 MED ORDER — TETANUS-DIPHTH-ACELL PERTUSSIS 5-2.5-18.5 LF-MCG/0.5 IM SUSY
0.5000 mL | PREFILLED_SYRINGE | Freq: Once | INTRAMUSCULAR | Status: AC
Start: 2023-06-25 — End: 2023-06-25
  Administered 2023-06-25: 0.5 mL via INTRAMUSCULAR
  Filled 2023-06-25: qty 0.5

## 2023-06-25 NOTE — ED Provider Notes (Signed)
 [place] EMERGENCY DEPARTMENT AT [facility] Provider Note   CSN: [id number] Arrival date & time: 06/25/23  2101     History  Chief Complaint  Patient presents with   Extremity Laceration    Billy [name] is a 59 y.o. male.  Patient to ED with laceration to left 5th finger pad after it was pinched in a folding part of a grill around 5:00 this afternoon. He bandaged it but came to ED because it continued to bleed. He is anticoagulated on Eliquis. No other injury. Last tetanus unknown.  The history is provided by the patient and the spouse. No language interpreter was used.       Home Medications Prior to Admission medications   Medication Sig Start Date End Date Taking? Authorizing Provider  alum & mag hydroxide-simeth (MAALOX MAX) 400-400-40 MG/5ML suspension Take 10 mLs by mouth every 6 (six) hours as needed for indigestion. 06/10/22   Horton, Clabe Seal, DO  apixaban (ELIQUIS) 5 MG TABS tablet Take 1 tablet (5 mg total) by mouth 2 (two) times daily. 06/05/23   Lewayne Bunting, MD  atorvastatin (LIPITOR) 80 MG tablet Take 1 tablet (80 mg total) by mouth daily. 06/05/23   Lewayne Bunting, MD  carvedilol (COREG) 3.125 MG tablet Take 1 tablet (3.125 mg total) by mouth 2 (two) times daily with a meal. 06/05/23   Crenshaw, Madolyn Frieze, MD  clopidogrel (PLAVIX) 75 MG tablet Take 1 tablet (75 mg total) by mouth daily. 06/05/23   Lewayne Bunting, MD  empagliflozin (JARDIANCE) 10 MG TABS tablet Take 1 tablet (10 mg total) by mouth daily. 06/05/23   Lewayne Bunting, MD  ezetimibe (ZETIA) 10 MG tablet TAKE 1 TABLET EVERY DAY 06/19/23   Lewayne Bunting, MD  famotidine (PEPCID) 40 MG tablet Take 1 tablet (40 mg total) by mouth at bedtime. 12/20/22   Meryl Dare, MD  fluticasone (FLONASE) 50 MCG/ACT nasal spray Place 2 sprays into both nostrils daily as needed for allergies or rhinitis. 06/08/23   Mliss Sax, MD  furosemide (LASIX) 40 MG tablet Take 1 tablet (40 mg total) by  mouth daily. 06/05/23   Lewayne Bunting, MD  isosorbide mononitrate (IMDUR) 30 MG 24 hr tablet Take 0.5 tablets (15 mg total) by mouth daily. 06/05/23   Lewayne Bunting, MD  levothyroxine (SYNTHROID) 50 MCG tablet TAKE 1 TABLET BY MOUTH DAILY 30 MINUTES BEFORE HAVING BREAKFAST. 06/08/23   Mliss Sax, MD  montelukast (SINGULAIR) 10 MG tablet TAKE 1 TABLET EVERY DAY AS NEEDED FOR ALLERGIES 09/16/20   Rema Fendt, NP  nitroGLYCERIN (NITROSTAT) 0.4 MG SL tablet Place 1 tablet (0.4 mg total) under the tongue every 5 (five) minutes as needed for chest pain. 06/05/23   Lewayne Bunting, MD  pantoprazole (PROTONIX) 40 MG tablet Take 1 tablet (40 mg total) by mouth daily. 02/08/23   Mliss Sax, MD  polyethylene glycol (MIRALAX / GLYCOLAX) 17 g packet Take 17 g by mouth daily. 07/09/21   Burnadette Pop, MD  potassium chloride SA (KLOR-CON M) 20 MEQ tablet Take 1 tablet (20 mEq total) by mouth daily. 06/07/23   Lewayne Bunting, MD  promethazine-dextromethorphan (PROMETHAZINE-DM) 6.25-15 MG/5ML syrup Take 5 mLs by mouth 4 (four) times daily as needed for cough. 05/11/23   Mliss Sax, MD  sacubitril-valsartan (ENTRESTO) 24-26 MG Take 1 tablet by mouth 2 (two) times daily. 06/05/23   Lewayne Bunting, MD  spironolactone (ALDACTONE) 25 MG tablet  Take 0.5 tablets (12.5 mg total) by mouth daily. 06/07/23   Lewayne Bunting, MD      Allergies    Patient has no known allergies.    Review of Systems   Review of Systems  Physical Exam Updated Vital Signs BP 112/86   Pulse 78   Temp 98.2 F (36.8 C) (Oral)   Resp 18   Wt 119 kg   SpO2 97%   BMI 36.59 kg/m  Physical Exam Constitutional:      Appearance: He is well-developed.  Pulmonary:     Effort: Pulmonary effort is normal.  Musculoskeletal:        General: Normal range of motion.     Cervical back: Normal range of motion.  Skin:    General: Skin is warm and dry.     Comments: <1 cm avulsion wound to pad of left 5th  finger. Oozing blood, no copious bleeding. No swelling. No foreign body.   Neurological:     Mental Status: He is alert and oriented to person, place, and time.     ED Results / Procedures / Treatments   Labs (all labs ordered are listed, but only abnormal results are displayed) Labs Reviewed - No data to display  EKG None  Radiology No results found.  Procedures Procedures    Medications Ordered in ED Medications  Tdap (BOOSTRIX) injection 0.5 mL (has no administration in time range)    ED Course/ Medical Decision Making/ A&P Clinical Course as of 06/25/23 2201  Mon Jun 25, 2023  2159 Avulsion injury to finger, continues to bleed, now with slow ooze. Wound cleaned, quick clot applied and finger bandaged. Wound care at home discussed. Tetanus updated.  [SU]    Clinical Course User Index [SU] Elpidio Anis, PA-C                                 Medical Decision Making Risk Prescription drug management.           Final Clinical Impression(s) / ED Diagnoses Final diagnoses:  Laceration of left little finger without foreign body without damage to nail, initial encounter    Rx / DC Orders ED Discharge Orders     None         Danne Harbor 06/25/23 2201    Rondel Baton, MD 06/26/23 1455

## 2023-06-25 NOTE — ED Triage Notes (Signed)
 Patient c/o laceration to his left 4th finger.  Patient reports he's on plavix and eliquis.  Bleeding controlled with bandage from home.

## 2023-06-25 NOTE — ED Notes (Signed)
 Finger soaked in sterile water and cleaned, applied quick clot to wound and covered with 2x2, kling and coban to secure.  Pt tolerated well.  Pt informed of how to change dsg tomorrow

## 2023-06-25 NOTE — Discharge Instructions (Signed)
 Reapply Quick Clot if there is any bleeding on bandage change in 24 hours.

## 2023-06-26 ENCOUNTER — Encounter: Payer: Self-pay | Admitting: Nurse Practitioner

## 2023-06-27 ENCOUNTER — Telehealth: Payer: Self-pay

## 2023-06-28 NOTE — Telephone Encounter (Signed)
 k

## 2023-06-29 ENCOUNTER — Other Ambulatory Visit: Payer: Self-pay | Admitting: Family Medicine

## 2023-06-29 DIAGNOSIS — E039 Hypothyroidism, unspecified: Secondary | ICD-10-CM

## 2023-06-29 NOTE — Telephone Encounter (Unsigned)
 Copied from CRM [id number]. Topic: Clinical - Prescription Issue >> Jun 29, 2023  3:27 PM Melissa C wrote: Reason for CRM: patient [facility] message from [facility] stating that prescriber declined levothyroxine (SYNTHROID) 50 MCG tablet refill request and they are wondering why. Please advise with patient's spouse. Thank you

## 2023-07-05 ENCOUNTER — Encounter: Payer: Self-pay | Admitting: Family Medicine

## 2023-07-05 ENCOUNTER — Ambulatory Visit: Payer: Self-pay

## 2023-07-05 ENCOUNTER — Telehealth: Admitting: Family Medicine

## 2023-07-05 DIAGNOSIS — R058 Other specified cough: Secondary | ICD-10-CM | POA: Insufficient documentation

## 2023-07-05 DIAGNOSIS — U071 COVID-19: Secondary | ICD-10-CM

## 2023-07-05 MED ORDER — PROMETHAZINE-DM 6.25-15 MG/5ML PO SYRP: 5.0000 mL | ORAL_SOLUTION | Freq: Four times a day (QID) | ORAL | 0 refills | Status: DC | PRN

## 2023-07-05 MED ORDER — FLUTICASONE PROPIONATE 50 MCG/ACT NA SUSP: 2.0000 | Freq: Two times a day (BID) | NASAL | 11 refills | Status: DC

## 2023-07-05 NOTE — Progress Notes (Signed)
 Virtual Visit via Video Note  I connected with Billy [name] on 07/05/2023 at  1:00 PM EDT by a video enabled telemedicine application and verified that I am speaking with the correct person using two identifiers.  Location: Patient: home Provider: office   I discussed the limitations of evaluation and management by telemedicine and the availability of in person appointments. The patient expressed understanding and agreed to proceed.  History of Present Illness:  Chief Complaint  Patient presents with   Covid Positive    Pt C/O of cough, fatigue and mild fever for 1 week. Pt used OTC medication Vicks vapo rub and Theraflu with positive in home test.    Discussed the use of AI scribe software for clinical note transcription with the patient, who gave verbal consent to proceed.   History of Present Illness   Billy [name] is a 58 year old male who presents with COVID-like symptoms.  He has been experiencing symptoms for the past week, including a stuffy nose, slight fever, cough without expectoration, and fatigue. The fever has ranged from 101 to 102 degrees Fahrenheit, with the last occurrence being yesterday night. He feels that his symptoms are improving. No chest pain or shortness of breath.  He is managing his symptoms with Mucinex pills, Vicks cough medicine, Theraflu, and occasionally uses Flonase. He has also been using promethazine DM cough syrup, which he recently picked up and finds helpful for soothing his cough. He is also on Singulair. For fever, he is taking Tylenol.  He confirms that his appetite is good and he is eating and drinking well.        Observations/Objective: There were no vitals filed for this visit.   Gen: NAD, resting comfortably HEENT: EOMI Pulm: NWOB Skin: no rash on face Neuro: no facial asymmetry or dysmetria Psych: Normal affect   Assessment and Plan:     COVID-19 infection Rondrick has experienced COVID-19 symptoms for one week,  including nasal congestion, mild fever (101-102F), cough, and fatigue. He tested positive for COVID-19. Symptoms are improving, and he denies chest pain or dyspnea. Antiviral medications are not indicated due to limited efficacy beyond five days of symptom onset and potential side effects. Symptomatic relief and monitoring for worsening symptoms are the focus. Fever typically resolves within five days but can persist up to ten days. Tylenol is preferred for fever management due to his use of clopidogrel and apixaban, minimizing bleeding risk. - Increase Flonase to two sprays twice daily for nasal congestion. - Continue Tylenol for fever management. - Refill promethazine DM syrup, 5 mL up to four times daily as needed for cough. - Continue Mucinex and other over-the-counter medications as needed, ensuring not to exceed recommended dosages. - Monitor for symptoms of chest pain and dyspnea. - Ensure adequate hydration and rest. - Advise emergency department visit if experiencing severe chest pain or dyspnea. - Advise clinic visit if fever persists beyond 10 days.       Follow Up Instructions: Return if symptoms worsen or fail to improve.    I discussed the assessment and treatment plan with the patient. The patient was provided an opportunity to ask questions and all were answered. The patient agreed with the plan and demonstrated an understanding of the instructions.   The patient was advised to call back or seek an in-person evaluation if the symptoms worsen or if the condition fails to improve as anticipated.  Garnette Gunner, MD

## 2023-07-05 NOTE — Telephone Encounter (Signed)
 Chief Complaint: COVID positive Symptoms: productive cough with brown mucus, itchy eyes, itchy throat, body aches, loss of taste, headaches Frequency: x 6 days Pertinent Negatives: Patient denies chest pain, SOB, chills, loss of smell Disposition: [] ED /[] Urgent Care (no appt availability in office) / [] Appointment(In office/virtual)/ []  [facility]/ [] Home Care/ [] Refused Recommended Disposition /[] [facility]/ [x]  Follow-up with PCP Additional Notes: Wife on the phone for triage states patient started with what they thought were allergy symptoms last Friday or Saturday. Patient was treating with OTC allergy pill and flonase. Last night patient tested positive for COVID. She states the whole house has COVID. She states patient has been taking his Promethazine cough syrup he had left over and it helps a little. Patient agreeable to virtual visit if his PCP can not prescribe him anything for the COVID symptoms, but patient would like to reach out to his PCP first before doing virtual visit.  Copied from CRM [id number]. Topic: Clinical - Red Word Triage >> Jul 05, 2023  8:37 AM Florestine Avers wrote: Red Word that prompted transfer to Nurse Triage: Patient wife called on his behalf stating that him,, her and their grandchildren all tested positive for covid. Patient is looking for medical advice on what to do for his cough, and itchiness of he eyes. Reason for Disposition  [1] HIGH RISK patient (e.g., weak immune system, age > 64 years, obesity with BMI 30 or higher, pregnant, chronic lung disease or other chronic medical condition) AND [2] COVID symptoms (e.g., cough, fever)  (Exceptions: Already seen by PCP and no new or worsening symptoms.)  Answer Assessment - Initial Assessment Questions 1. COVID-19 DIAGNOSIS: "How do you know that you have COVID?" (e.g., positive lab test or self-test, diagnosed by doctor or NP/PA, symptoms after exposure).     Positive self/home test.  2.  COVID-19 EXPOSURE: "Was there any known exposure to COVID before the symptoms began?" CDC Definition of close contact: within 6 feet (2 meters) for a total of 15 minutes or more over a 24-hour period.      Denies.  3. ONSET: "When did the COVID-19 symptoms start?"      3/28-29  4. WORST SYMPTOM: "What is your worst symptom?" (e.g., cough, fever, shortness of breath, muscle aches)     Cough, itchy eyes.  5. COUGH: "Do you have a cough?" If Yes, ask: "How bad is the cough?"       Yes. She states it is a constant cough and keeps him up all night. Denies any chest pain or SOB.  6. FEVER: "Do you have a fever?" If Yes, ask: "What is your temperature, how was it measured, and when did it start?"     Slight, wife reports it was 99.  7. RESPIRATORY STATUS: "Describe your breathing?" (e.g., normal; shortness of breath, wheezing, unable to speak)      She states he has some wheezing going on.  8. BETTER-SAME-WORSE: "Are you getting better, staying the same or getting worse compared to yesterday?"  If getting worse, ask, "In what way?"     Staying the same.  9. OTHER SYMPTOMS: "Do you have any other symptoms?"  (e.g., chills, fatigue, headache, loss of smell or taste, muscle pain, sore throat)     Itchy throat.  10. HIGH RISK DISEASE: "Do you have any chronic medical problems?" (e.g., asthma, heart or lung disease, weak immune system, obesity, etc.)       Heart disease, obesity.  11. VACCINE: "Have  you had the COVID-19 vaccine?" If Yes, ask: "Which one, how many shots, when did you get it?"       Yes, he got the first 2 doses back a few years ago.  12. PREGNANCY: "Is there any chance you are pregnant?" "When was your last menstrual period?"       N/A.  13. O2 SATURATION MONITOR:  "Do you use an oxygen saturation monitor (pulse oximeter) at home?" If Yes, ask "What is your reading (oxygen level) today?" "What is your usual oxygen saturation reading?" (e.g., 95%)       Yes, O2 over  95%.  Protocols used: Coronavirus (COVID-19) Diagnosed or Suspected-A-AH

## 2023-07-05 NOTE — Telephone Encounter (Signed)
 Can someone call them and schedule a virtual visit? Thanks. Dm/cma

## 2023-07-13 ENCOUNTER — Ambulatory Visit (HOSPITAL_COMMUNITY): Admission: RE | Admit: 2023-07-13 | Source: Ambulatory Visit

## 2023-07-13 ENCOUNTER — Ambulatory Visit (HOSPITAL_COMMUNITY)

## 2023-07-16 ENCOUNTER — Encounter: Payer: Self-pay | Admitting: Internal Medicine

## 2023-07-31 ENCOUNTER — Ambulatory Visit: Admitting: Nurse Practitioner

## 2023-08-07 NOTE — Addendum Note (Signed)
 Addended by: Edra Govern D on: 08/07/2023 01:45 PM   Modules accepted: Orders

## 2023-08-07 NOTE — Progress Notes (Signed)
 Remote ICD transmission.

## 2023-08-10 ENCOUNTER — Other Ambulatory Visit: Payer: Self-pay | Admitting: Cardiology

## 2023-08-24 ENCOUNTER — Telehealth: Payer: Self-pay | Admitting: Cardiology

## 2023-08-24 NOTE — Telephone Encounter (Signed)
 New Message:     Wife called and said patient is scheduled to have some dental work on 09-05-23. She said Dr Audery Blazing told the patient that before he hs any dental work, he would need an antibiotic.Would you please call his antibiotic in  to [facility] on [address], [place].

## 2023-08-28 ENCOUNTER — Ambulatory Visit (HOSPITAL_BASED_OUTPATIENT_CLINIC_OR_DEPARTMENT_OTHER)
Admission: RE | Admit: 2023-08-28 | Discharge: 2023-08-28 | Disposition: A | Discharge status: Home / Self Care | Source: Ambulatory Visit | Attending: Cardiovascular Disease | Admitting: Cardiovascular Disease

## 2023-08-28 ENCOUNTER — Ambulatory Visit (INDEPENDENT_AMBULATORY_CARE_PROVIDER_SITE_OTHER)
Admission: RE | Admit: 2023-08-28 | Discharge: 2023-08-28 | Disposition: A | Discharge status: Home / Self Care | Source: Ambulatory Visit | Attending: Cardiology | Admitting: Cardiology

## 2023-08-28 ENCOUNTER — Telehealth: Payer: Self-pay | Admitting: Cardiology

## 2023-08-28 ENCOUNTER — Ambulatory Visit (HOSPITAL_COMMUNITY)
Admission: RE | Admit: 2023-08-28 | Discharge: 2023-08-28 | Disposition: A | Discharge status: Home / Self Care | Source: Ambulatory Visit | Attending: Cardiovascular Disease | Admitting: Cardiovascular Disease

## 2023-08-28 DIAGNOSIS — I255 Ischemic cardiomyopathy: Secondary | ICD-10-CM | POA: Insufficient documentation

## 2023-08-28 DIAGNOSIS — I739 Peripheral vascular disease, unspecified: Secondary | ICD-10-CM

## 2023-08-28 DIAGNOSIS — Z95828 Presence of other vascular implants and grafts: Secondary | ICD-10-CM | POA: Diagnosis not present

## 2023-08-28 LAB — VAS US ABI WITH/WO TBI
Left ABI: 0.59
Right ABI: 0.72

## 2023-08-28 LAB — ECHOCARDIOGRAM COMPLETE
AR max vel: 3.14 cm2
AV Area VTI: 3.51 cm2
AV Area mean vel: 3.07 cm2
AV Mean grad: 2 mmHg
AV Peak grad: 3.7 mmHg
Ao pk vel: 0.97 m/s
Calc EF: 46.6 %
S' Lateral: 4.63 cm
Single Plane A2C EF: 48.3 %
Single Plane A4C EF: 40.8 %

## 2023-08-28 MED ORDER — PERFLUTREN LIPID MICROSPHERE
1.0000 mL | INTRAVENOUS | Status: AC | PRN
  Administered 2023-08-28: 5 mL via INTRAVENOUS

## 2023-08-28 NOTE — Telephone Encounter (Signed)
   Patient Name: Billy [name]  DOB: 1965/06/24 MRN: [id number]  Primary Cardiologist: Alexandria Angel, MD  Chart reviewed as part of pre-operative protocol coverage.  Simple dental extractions (i.e. 1-2 teeth) are considered low risk procedures per guidelines and generally do not require any specific cardiac clearance. It is also generally accepted that for simple extractions and dental cleanings, there is no need to interrupt blood thinner therapy.   SBE prophylaxis is not required for the patient from a cardiac standpoint.  I will route this recommendation to the requesting party via Epic fax function and remove from pre-op pool.  Please call with questions.  Francene Ing, Retha Cast, NP 08/28/2023, 11:19 AM

## 2023-08-28 NOTE — Telephone Encounter (Signed)
 Spoke with pt wife, he does not require antibiotics prior to dental work.

## 2023-08-28 NOTE — Telephone Encounter (Signed)
   Pre-operative Risk Assessment    Patient Name: Billy [name]  DOB: 01/14/1966 MRN: [id number]     Request for Surgical Clearance    Procedure:  Dental Extraction - Amount of Teeth to be Pulled:  1  Date of Surgery:  Clearance 09/05/23                                 Surgeon:  Dr Sharan Dash, DDS Surgeon's Group or Practice Name:  [facility] Phone number:  [phone] Fax number: [phone]   Type of Clearance Requested:   - Pharmacy:  Hold Apixaban  (Eliquis ) Def to cards   Type of Anesthesia:  Local    Additional requests/questions:  Does this patient need antibiotics?  [name]   08/28/2023, 10:44 AM

## 2023-08-29 ENCOUNTER — Ambulatory Visit: Payer: Self-pay | Admitting: Cardiology

## 2023-08-29 ENCOUNTER — Ambulatory Visit: Payer: Self-pay | Admitting: Cardiovascular Disease

## 2023-09-06 NOTE — Telephone Encounter (Signed)
 Called and spoke with the patient's wife, per the patient. He has been unable to go to rehab due to the expense of driving out there and having to care for his granddaughter. Their insurance does cover the local gym so he is going to try and go to the gym. His wife has been advised of some Pad exercises.   She stated that once things "settle down" then they will try to get back into rehab. She has been advised to call back if we can help with this due to the importance of him being active.   EXERCISE PROGRAM FOR INDIVIDUALS WITH  PERIPHERAL ARTERIAL DISEASE (PAD)   General Information:   Research in vascular exercise has demonstrated remarkable improvement in symptoms of leg pain (claudication) without expensive or invasive interventions. Regular walking programs are extremely helpful for patients with PAD and intermittent claudication.  These steps are designed to help you get started with a safe and effective program to help you walk farther with less pain:   Walk at least three times a week (preferably every day).  Your goal is to build up to 30-45 minutes of total walking time (not counting rest breaks). It may take you several weeks to build up your exercise time starting at 5-10 minutes or whatever you can tolerate.  Walk as far as possible using moderate to maximal pain (7-8 on the scale below) as a signal to stop, and resume walking when the pain goes away.  On a treadmill, set the speed and grade at a level that brings on the claudication pain within 3 to 5 minutes. Walk at this rate until you experience claudication of moderate severity, rest until the pain improves, and then resume walking.  Over time, you will be able to walk longer at the designated speed and grade; workload should then be increased until you develop the pain within 3 to 5 minutes once again.  This regimen will induce a significant benefit. Studies have demonstrated that participants may be able to walk up to three or  four times farther and have less leg pain, within twelve weeks, by following this protocol.  Pain Scale    0_____1_____2_____3_____4_____5_____6_____7_____8_____9_____10   No Pain                                   Moderate Pain                               Maximal Pain

## 2023-09-06 NOTE — Telephone Encounter (Signed)
-----   Message from [place] sent at 08/29/2023  3:55 PM EDT ----- Stable ABI on the right side but seems to be mildly worse on the left.  We referred him to a structured exercise therapy program but it does not seem that he is going.  Can you please check on that?  He should follow-up with me after he is done with rehab to see if he requires angiography.

## 2023-09-11 ENCOUNTER — Ambulatory Visit: Admitting: Nurse Practitioner

## 2023-09-17 ENCOUNTER — Ambulatory Visit (INDEPENDENT_AMBULATORY_CARE_PROVIDER_SITE_OTHER): Payer: Medicare HMO

## 2023-09-17 DIAGNOSIS — I5022 Chronic systolic (congestive) heart failure: Secondary | ICD-10-CM

## 2023-09-17 DIAGNOSIS — I255 Ischemic cardiomyopathy: Secondary | ICD-10-CM

## 2023-09-17 LAB — CUP PACEART REMOTE DEVICE CHECK
Battery Remaining Longevity: 102 mo
Battery Remaining Percentage: 82 %
Battery Voltage: 2.99 V
Brady Statistic RV Percent Paced: 1 %
Date Time Interrogation Session: 20250616020031
HighPow Impedance: 65 Ohm
Implantable Lead Connection Status: 753985
Implantable Lead Implant Date: 20101111
Implantable Lead Location: 753860
Implantable Lead Model: 7121
Implantable Pulse Generator Implant Date: 20230317
Lead Channel Impedance Value: 560 Ohm
Lead Channel Pacing Threshold Amplitude: 1 V
Lead Channel Pacing Threshold Pulse Width: 0.5 ms
Lead Channel Sensing Intrinsic Amplitude: 12 mV
Lead Channel Setting Pacing Amplitude: 2.5 V
Lead Channel Setting Pacing Pulse Width: 0.5 ms
Lead Channel Setting Sensing Sensitivity: 0.5 mV
Pulse Gen Serial Number: 210001302
Zone Setting Status: 755011

## 2023-09-18 ENCOUNTER — Ambulatory Visit: Payer: Self-pay | Admitting: Cardiology

## 2023-09-25 ENCOUNTER — Other Ambulatory Visit: Payer: Self-pay | Admitting: Cardiology

## 2023-09-25 ENCOUNTER — Encounter: Payer: Self-pay | Admitting: Cardiology

## 2023-09-25 DIAGNOSIS — I480 Paroxysmal atrial fibrillation: Secondary | ICD-10-CM

## 2023-09-25 NOTE — Telephone Encounter (Signed)
 Prescription refill request for Eliquis  received. Indication:afib Last office visit:3/25 Scr:1.26  3/25 Age: 58 Weight:119  kg  Prescription refilled

## 2023-10-09 ENCOUNTER — Other Ambulatory Visit: Payer: Self-pay | Admitting: Family Medicine

## 2023-10-09 DIAGNOSIS — K219 Gastro-esophageal reflux disease without esophagitis: Secondary | ICD-10-CM

## 2023-10-09 MED ORDER — PANTOPRAZOLE SODIUM 40 MG PO TBEC: 40.0000 mg | DELAYED_RELEASE_TABLET | Freq: Every day | ORAL | 1 refills | Status: DC

## 2023-10-09 NOTE — Telephone Encounter (Signed)
 Copied from CRM [id number]. Topic: Clinical - Medication Refill >> Oct 09, 2023 11:15 AM Thersia C wrote: Medication: pantoprazole  (PROTONIX ) 40 MG tablet  Has the patient contacted their pharmacy? Yes (Agent: If no, request that the patient contact the pharmacy for the refill. If patient does not wish to contact the pharmacy document the reason why and proceed with request.) (Agent: If yes, when and what did the pharmacy advise?)  This is the patient's preferred pharmacy:    [facility] - [place], [place] - [address] [place] [address] Phone: [phone] Fax: [phone]  Is this the correct pharmacy for this prescription? Yes If no, delete pharmacy and type the correct one.   Has the prescription been filled recently? No  Is the patient out of the medication? Yes  Has the patient been seen for an appointment in the last year OR does the patient have an upcoming appointment? Yes  Can we respond through MyChart? Yes  Agent: Please be advised that Rx refills may take up to 3 business days. We ask that you follow-up with your pharmacy.

## 2023-10-25 NOTE — Progress Notes (Signed)
 Remote ICD transmission.

## 2023-11-03 ENCOUNTER — Encounter (HOSPITAL_BASED_OUTPATIENT_CLINIC_OR_DEPARTMENT_OTHER): Payer: Self-pay | Admitting: *Deleted

## 2023-11-03 ENCOUNTER — Emergency Department (HOSPITAL_BASED_OUTPATIENT_CLINIC_OR_DEPARTMENT_OTHER)
Admission: EM | Admit: 2023-11-03 | Discharge: 2023-11-03 | Disposition: A | Discharge status: Home / Self Care | Attending: Emergency Medicine | Admitting: Emergency Medicine

## 2023-11-03 ENCOUNTER — Emergency Department (HOSPITAL_BASED_OUTPATIENT_CLINIC_OR_DEPARTMENT_OTHER): Admitting: Radiology

## 2023-11-03 ENCOUNTER — Other Ambulatory Visit: Payer: Self-pay

## 2023-11-03 DIAGNOSIS — Z7901 Long term (current) use of anticoagulants: Secondary | ICD-10-CM | POA: Insufficient documentation

## 2023-11-03 DIAGNOSIS — Z7902 Long term (current) use of antithrombotics/antiplatelets: Secondary | ICD-10-CM | POA: Diagnosis not present

## 2023-11-03 DIAGNOSIS — Z79899 Other long term (current) drug therapy: Secondary | ICD-10-CM | POA: Insufficient documentation

## 2023-11-03 DIAGNOSIS — R519 Headache, unspecified: Secondary | ICD-10-CM | POA: Diagnosis not present

## 2023-11-03 DIAGNOSIS — M26601 Right temporomandibular joint disorder, unspecified: Secondary | ICD-10-CM | POA: Insufficient documentation

## 2023-11-03 DIAGNOSIS — M26609 Unspecified temporomandibular joint disorder, unspecified side: Secondary | ICD-10-CM

## 2023-11-03 DIAGNOSIS — R6884 Jaw pain: Secondary | ICD-10-CM | POA: Diagnosis not present

## 2023-11-03 MED ORDER — OXYCODONE-ACETAMINOPHEN 5-325 MG PO TABS: 1.0000 | ORAL_TABLET | Freq: Four times a day (QID) | ORAL | 0 refills | Status: DC | PRN

## 2023-11-03 MED ORDER — METHYLPREDNISOLONE 4 MG PO TBPK: ORAL_TABLET | ORAL | 0 refills | Status: DC

## 2023-11-03 MED ORDER — OXYCODONE-ACETAMINOPHEN 5-325 MG PO TABS
1.0000 | ORAL_TABLET | Freq: Once | ORAL | Status: AC
  Administered 2023-11-03: 1 via ORAL
  Filled 2023-11-03: qty 1

## 2023-11-03 NOTE — ED Provider Notes (Signed)
 [place] EMERGENCY DEPARTMENT AT [facility] Provider Note   CSN: [id number] Arrival date & time: 11/03/23  1639     Patient presents with: Jaw Pain   Billy [name] is a 58 y.o. male.   HPI Patient reports about 2 weeks ago he yawned pretty big and has had pain in the right jaw since then.  He denies that it pops or seem to crack.  He reports since that time he just has a lot of aching pain it hurts when he chews.  He reports it hurts at night.  It is getting worse rather than better.  Is now even giving him a headache on the side of the head.  No fevers no chills.  No neck stiffness.  No difficulty swallowing or breathing.    Prior to Admission medications   Medication Sig Start Date End Date Taking? Authorizing Provider  methylPREDNISolone  (MEDROL  DOSEPAK) 4 MG TBPK tablet Per dose pack instruction 11/03/23  Yes Franca Stakes, Ludivina, MD  oxyCODONE -acetaminophen  (PERCOCET) 5-325 MG tablet Take 1 tablet by mouth every 6 (six) hours as needed. 11/03/23  Yes Armenta Ludivina, MD  alum & mag hydroxide-simeth (MAALOX MAX) 400-400-40 MG/5ML suspension Take 10 mLs by mouth every 6 (six) hours as needed for indigestion. 06/10/22   Horton, Roxie HERO, DO  atorvastatin  (LIPITOR ) 80 MG tablet Take 1 tablet (80 mg total) by mouth daily. 06/05/23   Pietro Redell RAMAN, MD  carvedilol  (COREG ) 3.125 MG tablet Take 1 tablet (3.125 mg total) by mouth 2 (two) times daily with a meal. 06/05/23   Crenshaw, Redell RAMAN, MD  clopidogrel  (PLAVIX ) 75 MG tablet Take 1 tablet (75 mg total) by mouth daily. 06/05/23   Pietro Redell RAMAN, MD  ELIQUIS  5 MG TABS tablet TAKE 1 TABLET TWICE DAILY 09/25/23   Pietro Redell RAMAN, MD  ezetimibe  (ZETIA ) 10 MG tablet TAKE 1 TABLET EVERY DAY 06/19/23   Pietro Redell RAMAN, MD  famotidine  (PEPCID ) 40 MG tablet Take 1 tablet (40 mg total) by mouth at bedtime. 12/20/22   Aneita Gwendlyn DASEN, MD  fluticasone  (FLONASE ) 50 MCG/ACT nasal spray Place 2 sprays into both nostrils 2 (two) times daily. 07/05/23    Sebastian Beverley NOVAK, MD  furosemide  (LASIX ) 40 MG tablet Take 1 tablet (40 mg total) by mouth daily. 06/05/23   Pietro Redell RAMAN, MD  isosorbide  mononitrate (IMDUR ) 30 MG 24 hr tablet TAKE 1/2 TABLET EVERY DAY 06/27/23   Pietro Redell RAMAN, MD  JARDIANCE  10 MG TABS tablet TAKE 1 TABLET EVERY DAY 08/10/23   Pietro Redell RAMAN, MD  levothyroxine  (SYNTHROID ) 50 MCG tablet TAKE 1 TABLET BY MOUTH DAILY 30 MINUTES BEFORE HAVING BREAKFAST. 06/08/23   Berneta Elsie Sayre, MD  montelukast  (SINGULAIR ) 10 MG tablet TAKE 1 TABLET EVERY DAY AS NEEDED FOR ALLERGIES 09/16/20   Lorren Greig PARAS, NP  nitroGLYCERIN  (NITROSTAT ) 0.4 MG SL tablet Place 1 tablet (0.4 mg total) under the tongue every 5 (five) minutes as needed for chest pain. 06/05/23   Pietro Redell RAMAN, MD  pantoprazole  (PROTONIX ) 40 MG tablet Take 1 tablet (40 mg total) by mouth daily. 10/09/23   Berneta Elsie Sayre, MD  polyethylene glycol (MIRALAX  / GLYCOLAX ) 17 g packet Take 17 g by mouth daily. 07/09/21   Jillian Buttery, MD  potassium chloride  SA (KLOR-CON  M) 20 MEQ tablet Take 1 tablet (20 mEq total) by mouth daily. 06/07/23   Pietro Redell RAMAN, MD  promethazine -dextromethorphan (PROMETHAZINE -DM) 6.25-15 MG/5ML syrup Take 5 mLs by mouth 4 (four) times  daily as needed for cough. 07/05/23   Sebastian Beverley NOVAK, MD  sacubitril -valsartan  (ENTRESTO ) 24-26 MG Take 1 tablet by mouth 2 (two) times daily. 06/05/23   Pietro Redell RAMAN, MD  spironolactone  (ALDACTONE ) 25 MG tablet Take 0.5 tablets (12.5 mg total) by mouth daily. 06/07/23   Pietro Redell RAMAN, MD    Allergies: Patient has no known allergies.    Review of Systems  Updated Vital Signs BP 107/75   Pulse 85   Temp 98.4 F (36.9 C)   Resp 16   SpO2 94%   Physical Exam Constitutional:      Appearance: Normal appearance.  HENT:     Head: Normocephalic and atraumatic.     Comments: No facial swelling.  Patient has focal pain at the temporomandibular joint with opening and closing.  There is some crepitus but  no dislocation.  Jaw is moving in symmetric fashion.    Right Ear: Tympanic membrane normal.     Left Ear: Tympanic membrane normal.     Nose: Nose normal.     Mouth/Throat:     Mouth: Mucous membranes are moist.     Pharynx: Oropharynx is clear.     Comments: Patient has had several dental extractions.  Condition of dentition is moderate to poor but no active areas of inflammation or drainage. Eyes:     Extraocular Movements: Extraocular movements intact.     Conjunctiva/sclera: Conjunctivae normal.     Pupils: Pupils are equal, round, and reactive to light.  Neck:     Comments: No tenderness of the soft tissues of the neck.  No lymphadenopathy Pulmonary:     Effort: Pulmonary effort is normal.  Musculoskeletal:     Cervical back: Neck supple. No tenderness.  Lymphadenopathy:     Cervical: No cervical adenopathy.  Skin:    General: Skin is warm and dry.  Neurological:     General: No focal deficit present.     Mental Status: He is alert and oriented to person, place, and time.     Motor: No weakness.     Coordination: Coordination normal.  Psychiatric:        Mood and Affect: Mood normal.     (all labs ordered are listed, but only abnormal results are displayed) Labs Reviewed - No data to display  EKG: None  Radiology: DG TMJ Open & Close Bilateral Result Date: 11/03/2023 EXAM: OPEN AND CLOSED BILATERAL VIEW(S) XRAY OF THE MANDIBLE 11/03/2023 06:18:00 PM COMPARISON: None available. CLINICAL HISTORY: [id number] Jaw pain [id number]. Table formatting from the original note was not included. Images from the original note were not included. Pt to ED reporting right sided jaw pain and right sided facial pain x 2 weeks. Pain started after a yawn. No obvious tooth pain or facial swelling. Per triage note. Pt to ED reporting right sided jaw pain and right sided facial pain x 2 weeks. Pain started after a yawn. No obvious tooth pain or facial swelling. FINDINGS: BONES: No acute fracture or  focal osseous lesion. JOINTS: Anterior subluxation of the head of the condylar process of the mandible with opening bilaterally. The head of the condylar process is well seated in the temporomandibular fossa on closed mouth views. SOFT TISSUES: The soft tissues are unremarkable. IMPRESSION: 1. Anterior subluxation of the head of the condylar process of the mandible with opening bilaterally. The head of the condylar process is well seated in the temporomandibular fossa on closed mouth views. Electronically signed by: Norman Gatlin MD 11/03/2023  06:30 PM EDT RP Workstation: [id number]     Procedures   Medications Ordered in the ED  oxyCODONE -acetaminophen  (PERCOCET/ROXICET) 5-325 MG per tablet 1 tablet (1 tablet Oral Given 11/03/23 1801)                                    Medical Decision Making Amount and/or Complexity of Data Reviewed Radiology: ordered.  Risk Prescription drug management.   Patient presents as outlined.  Pain is focal to the right TMJ.  Oral exam does not show any evidence of active infection.  No soft tissue neck swelling or lymphadenopathy.  Will obtain x-rays of TMJ.  X-rays interpreted by radiology no dislocation.  Positive for subluxation.  Patient has significant pain associated with chewing and mouth opening local to the right temporomandibular joint.  Findings are consistent with a TMJ syndrome.  The patient does take anticoagulants of Eliquis  and Plavix .  He is not a candidate for NSAIDs.  At this time we will plan to treat with a Medrol  Dosepak and Percocet for more acute pain.  We discussed the plan of following up with a dentist and discharge instructions for additional home measures.  Patient voices understanding and plans for follow-up.     Final diagnoses:  Temporal mandibular joint disorder    ED Discharge Orders          Ordered    methylPREDNISolone  (MEDROL  DOSEPAK) 4 MG TBPK tablet        11/03/23 1922    oxyCODONE -acetaminophen  (PERCOCET)  5-325 MG tablet  Every 6 hours PRN        11/03/23 ARTEMUS Armenta Canning, MD 11/03/23 1933

## 2023-11-03 NOTE — Discharge Instructions (Signed)
 1.  Start the steroid pack tomorrow as prescribed.  You may take 1 Percocet every 4-6 hours for additional pain control.  When pain is improving, changed to extra strength Tylenol . 2.  Make an appointment with your dentist for further evaluation. 3.  Return to the emergency department if you have new worsening or concerning symptoms.

## 2023-11-03 NOTE — ED Triage Notes (Signed)
 Pt to ED reporting right sided jaw pain and right sided facial pain x 2 weeks. Pain started after a yawn. No obvious tooth pain or facial swelling.

## 2023-11-08 ENCOUNTER — Ambulatory Visit: Payer: Medicare HMO | Admitting: Family Medicine

## 2023-11-19 ENCOUNTER — Ambulatory Visit: Admitting: Family Medicine

## 2023-11-21 ENCOUNTER — Encounter: Payer: Self-pay | Admitting: Family Medicine

## 2023-11-24 ENCOUNTER — Other Ambulatory Visit: Payer: Self-pay | Admitting: Family Medicine

## 2023-11-24 DIAGNOSIS — E039 Hypothyroidism, unspecified: Secondary | ICD-10-CM

## 2023-12-04 ENCOUNTER — Ambulatory Visit: Admitting: Cardiovascular Disease

## 2023-12-04 NOTE — Progress Notes (Deleted)
 Cardiology Office Note   Date:  12/04/2023   ID:  Kaeden Mester, DOB 1965-12-30, MRN [id number]  PCP:  Berneta Elsie Sayre, MD  Cardiologist:  Dr. Pietro  No chief complaint on file.     History of Present Illness: Billy [name] is a 58 y.o. male who is here today for follow-up visit regarding peripheral arterial disease.  The patient had previous cardiac arrest in November 2010 status post ICD placement.  He had previous CABG.  Other medical problems include hypertension, previous tobacco use , PAD and hyperlipidemia.  He had previous left SFA stent many years ago.    He had unstable angina in October 2020.  Cardiac catheterization showed severe native 2-vessel coronary artery disease.  SVG to RCA and SVG to OM 2 were known to be occluded.  LIMA to LAD was found to be atretic with competitive flow due to minimal disease in the native LAD.  SVG to first diagonal was patent.  There was 80% ostial left circumflex stenosis which was treated with scoring balloon angioplasty.    He had worsening right calf claudication in 2021.  Angiography was performed in February 2021 which showed  severe stenosis affecting the proximal right external iliac artery with occluded mid SFA with reconstitution distally via collaterals from the profunda.  I performed successful self-expanding stent placement to the right external iliac artery.  He had issues with recurrent ICD shocks in 2022.  He underwent cardiac catheterization in February 2022 which showed significant multivessel CAD with chronic occlusion of first diagonal, mild LAD disease, patent ostial left circumflex, chronic occlusion of the mid AV groove left circumflex with collaterals and chronically occluded right coronary artery with left-to-right collaterals.  LIMA to LAD was atretic as native LAD had no obstructive disease.  He had left hip surgery at [facility] which required revision twice.    He has been doing well overall with no chest pain or  worsening dyspnea.  He reports worsening left leg claudication.    Most recent noninvasive vascular testing in February 2024 showed an ABI of 0.79 on the right and 0.64 on the left.  Duplex showed patent right external iliac artery stent.  He has bilateral SFA occlusion.  Past Medical History:  Diagnosis Date   Aortic atherosclerosis ([name])    on CXR   Blood in stool    C. difficile colitis    a. remote hx 2010.   Cardiac arrest - ventricular fibrillation 02/11/2009   a. 02/2009 s/p St Jude ICD.   Chronic combined systolic and diastolic CHF (congestive heart failure) ([name])    Chronic kidney disease, stage 3a ([name])    Coronary artery disease    a. PCI to Cx age 20. b. CABGx4 in 2003. c. BMS to OM1 in 12/2007. d. PTCA to Cx 01/2019.   Former tobacco use    Hyperlipidemia    Hypertension    Ischemic cardiomyopathy    Marijuana abuse    Myocardial infarct ([name])    NON-ST-SEGMENT ELEVATION MI   Obesity    PAD (peripheral artery disease) ([name])    PAF (paroxysmal atrial fibrillation) ([name])    Pathologic fracture of left acetabulum 07/03/2021   PSVT (paroxysmal supraventricular tachycardia) ([name])    Ventricular tachycardia ([name])     Past Surgical History:  Procedure Laterality Date   ABDOMINAL AORTOGRAM W/LOWER EXTREMITY N/A 05/14/2019   Procedure: ABDOMINAL AORTOGRAM W/LOWER EXTREMITY;  Surgeon: Darron Deatrice LABOR, MD;  Location: [facility] INVASIVE CV LAB;  Service: Cardiovascular;  Laterality: N/A;   CARDIAC DEFIBRILLATOR PLACEMENT     STJ single chamber ICD implanted for secondary prevention   CIRCUMCISION     COLONOSCOPY WITH PROPOFOL  N/A 01/11/2023   Procedure: COLONOSCOPY WITH PROPOFOL ;  Surgeon: Aneita Gwendlyn DASEN, MD;  Location: [facility];  Service: Gastroenterology;  Laterality: N/A;   CORONARY ARTERY BYPASS GRAFT  06/17/01   X 4   CORONARY BALLOON ANGIOPLASTY N/A 01/22/2019   Procedure: CORONARY BALLOON ANGIOPLASTY;  Surgeon: Anner Alm ORN, MD;  Location[facility] INVASIVE CV LAB;   Service: Cardiovascular;  Laterality: N/A;   HEMOSTASIS CLIP PLACEMENT  01/11/2023   Procedure: HEMOSTASIS CLIP PLACEMENT;  Surgeon: Aneita Gwendlyn DASEN, MD;  Location: [facility];  Service: Gastroenterology;;   HIP CLOSED REDUCTION Left 07/02/2021   Procedure: CLOSED REDUCTION HIP with placement of skeletal traction;  Surgeon: Dozier Soulier, MD;  Location: [facility] OR;  Service: Orthopedics;  Laterality: Left;   ICD GENERATOR CHANGEOUT N/A 06/17/2021   Procedure: ICD GENERATOR CHANGEOUT;  Surgeon: Fernande Elspeth BROCKS, MD;  Location: [name] INVASIVE CV LAB;  Service: Cardiovascular;  Laterality: N/A;   LEFT HEART CATH AND CORS/GRAFTS ANGIOGRAPHY N/A 01/22/2019   Procedure: LEFT HEART CATH AND CORS/GRAFTS ANGIOGRAPHY;  Surgeon: Anner Alm ORN, MD;  Location: [facility] INVASIVE CV LAB;  Service: Cardiovascular;  Laterality: N/A;   LEFT HEART CATH AND CORS/GRAFTS ANGIOGRAPHY N/A 05/18/2020   Procedure: LEFT HEART CATH AND CORS/GRAFTS ANGIOGRAPHY;  Surgeon: Burnard Debby LABOR, MD;  Location: [facility] INVASIVE CV LAB;  Service: Cardiovascular;  Laterality: N/A;   OPEN REDUCTION INTERNAL FIXATION ACETABULUM FRACTURE POSTERIOR Left 07/04/2021   Procedure: OPEN REDUCTION INTERNAL FIXATION ACETABULUM FRACTURE POSTERIOR;  Surgeon: Celena Sharper, MD;  Location: [facility] OR;  Service: Orthopedics;  Laterality: Left;   PERIPHERAL VASCULAR INTERVENTION Right 05/14/2019   Procedure: PERIPHERAL VASCULAR INTERVENTION;  Surgeon: Darron Deatrice LABOR, MD;  Location: [facility]VASIVE CV LAB;  Service: Cardiovascular;  Laterality: Right;   POLYPECTOMY  01/11/2023   Procedure: POLYPECTOMY;  Surgeon: Aneita Gwendlyn DASEN, MD;  Location: [facility];  Service: Gastroenterology;;   SVT ABLATION N/A 06/21/2020   Procedure: SVT ABLATION;  Surgeon: Waddell Danelle ORN, MD;  Location: [facility] INVASIVE CV LAB;  Service: Cardiovascular;  Laterality: N/A;     Current Outpatient Medications  Medication Sig Dispense Refill   alum & mag hydroxide-simeth (MAALOX MAX) 400-400-40 MG/5ML  suspension Take 10 mLs by mouth every 6 (six) hours as needed for indigestion. 355 mL 0   atorvastatin  (LIPITOR ) 80 MG tablet Take 1 tablet (80 mg total) by mouth daily. 90 tablet 3   carvedilol  (COREG ) 3.125 MG tablet Take 1 tablet (3.125 mg total) by mouth 2 (two) times daily with a meal. 180 tablet 3   clopidogrel  (PLAVIX ) 75 MG tablet Take 1 tablet (75 mg total) by mouth daily. 90 tablet 3   ELIQUIS  5 MG TABS tablet TAKE 1 TABLET TWICE DAILY 180 tablet 3   ezetimibe  (ZETIA ) 10 MG tablet TAKE 1 TABLET EVERY DAY 90 tablet 3   famotidine  (PEPCID ) 40 MG tablet Take 1 tablet (40 mg total) by mouth at bedtime. 30 tablet 11   fluticasone  (FLONASE ) 50 MCG/ACT nasal spray Place 2 sprays into both nostrils 2 (two) times daily. 16 g 11   furosemide  (LASIX ) 40 MG tablet Take 1 tablet (40 mg total) by mouth daily. 90 tablet 3   isosorbide  mononitrate (IMDUR ) 30 MG 24 hr tablet TAKE 1/2 TABLET EVERY DAY 45 tablet 3   JARDIANCE  10 MG TABS tablet TAKE 1 TABLET  EVERY DAY 90 tablet 3   levothyroxine  (SYNTHROID ) 50 MCG tablet TAKE 1 TABLET BY MOUTH DAILY BEFORE BREAKFAST. 30 MINUTES BEFORE HAVING BREAKFAST. 90 tablet 3   methylPREDNISolone  (MEDROL  DOSEPAK) 4 MG TBPK tablet Per dose pack instruction 21 tablet 0   montelukast  (SINGULAIR ) 10 MG tablet TAKE 1 TABLET EVERY DAY AS NEEDED FOR ALLERGIES 90 tablet 1   nitroGLYCERIN  (NITROSTAT ) 0.4 MG SL tablet Place 1 tablet (0.4 mg total) under the tongue every 5 (five) minutes as needed for chest pain. 25 tablet 11   oxyCODONE -acetaminophen  (PERCOCET) 5-325 MG tablet Take 1 tablet by mouth every 6 (six) hours as needed. 15 tablet 0   pantoprazole  (PROTONIX ) 40 MG tablet Take 1 tablet (40 mg total) by mouth daily. 90 tablet 1   polyethylene glycol (MIRALAX  / GLYCOLAX ) 17 g packet Take 17 g by mouth daily. 14 each 0   potassium chloride  SA (KLOR-CON  M) 20 MEQ tablet Take 1 tablet (20 mEq total) by mouth daily. 90 tablet 3   promethazine -dextromethorphan (PROMETHAZINE -DM)  6.25-15 MG/5ML syrup Take 5 mLs by mouth 4 (four) times daily as needed for cough. 118 mL 0   sacubitril -valsartan  (ENTRESTO ) 24-26 MG Take 1 tablet by mouth 2 (two) times daily. 180 tablet 3   spironolactone  (ALDACTONE ) 25 MG tablet Take 0.5 tablets (12.5 mg total) by mouth daily. 45 tablet 3   No current facility-administered medications for this visit.    Allergies:   Patient has no known allergies.    Social History:  The patient  reports that he quit smoking about 4 years ago. His smoking use included cigarettes. He started smoking about 24 years ago. He has a 40 pack-year smoking history. He has never been exposed to tobacco smoke. He has never used smokeless tobacco. He reports that he does not drink alcohol and does not use drugs.   Family History:  The patient's family history includes Cancer in his paternal uncle and paternal uncle; Heart attack in his father; Heart disease in his sister; Hypertension in his mother and another family member.    ROS:  Please see the history of present illness.   Otherwise, review of systems are positive for none.   All other systems are reviewed and negative.    PHYSICAL EXAM: VS:  There were no vitals taken for this visit. , BMI There is no height or weight on file to calculate BMI. GEN: Well nourished, well developed, in no acute distress  HEENT: normal  Neck: no JVD, carotid bruits, or masses Cardiac: RRR; no murmurs, rubs, or gallops,no edema  Respiratory:  clear to auscultation bilaterally, normal work of breathing GI: soft, nontender, nondistended, + BS MS: no deformity or atrophy  Skin: warm and dry, no rash Neuro:  Strength and sensation are intact Psych: euthymic mood, full affect   EKG:  EKG is not ordered today. I reviewed his EKG which was done during his visit with Dr. Pietro.  It showed sinus rhythm with PVCs.   Recent Labs: 05/11/2023: TSH 2.47 06/13/2023: ALT 19; BUN 16; Creatinine, Ser 1.26; Potassium 4.9; Sodium  143 06/21/2023: Hemoglobin 17.9; Platelet Count 266    Lipid Panel    Component Value Date/Time   CHOL 129 05/11/2023 1115   CHOL 118 12/23/2021 0818   TRIG 132.0 05/11/2023 1115   HDL 37.10 (L) 05/11/2023 1115   HDL 32 (L) 12/23/2021 0818   CHOLHDL 3 05/11/2023 1115   VLDL 26.4 05/11/2023 1115   LDLCALC 65 05/11/2023 1115  LDLCALC 65 12/23/2021 0818   LDLDIRECT 95.0 11/29/2020 1018      Wt Readings from Last 3 Encounters:  06/25/23 262 lb 5.6 oz (119 kg)  06/21/23 261 lb 6.4 oz (118.6 kg)  06/05/23 261 lb (118.4 kg)           No data to display            ASSESSMENT AND PLAN:  1.  Peripheral arterial disease: Status post stent placement to the right external iliac artery with .  He has known bilateral SFA occlusion.  He now reports worsening left calf claudication.  I requested repeat ABI and aortoiliac duplex.   I discussed with him the importance of regular exercise and elected to refer him to a structured exercise therapy. The patient was educated on risk factors surrounding PAD. I discussed the importance of controlling risk factors and importance of regular exercise to help reduce pain. I discussed the effects of exercise on reducing claudications and improving his risk factors including hyperlipidemia and hypertension.   2.  Coronary artery disease involving native coronary arteries without angina: He seems to be stable from a cardiac standpoint.  3.  Chronic systolic heart failure: He appears to be euvolemic and currently on optimal medical therapy.    4.  Hyperlipidemia: Continue treatment with atorvastatin  and Zetia  with a target LDL of less than 70.  I reviewed most recent lipid profile which showed an LDL of 65.  5.  Paroxysmal atrial fibrillation: He is in sinus rhythm.  He is currently on anticoagulation with Eliquis .  7.  Recurrent ICD shocks: No recent episodes.  He is no longer on amiodarone .   Disposition: Follow-up with me in 12  months.  Signed,  Deatrice Cage, MD  12/04/2023 8:08 AM    [facility]

## 2023-12-07 ENCOUNTER — Other Ambulatory Visit: Payer: Self-pay | Admitting: Cardiology

## 2023-12-07 DIAGNOSIS — Z951 Presence of aortocoronary bypass graft: Secondary | ICD-10-CM

## 2023-12-11 ENCOUNTER — Ambulatory Visit: Admitting: Cardiovascular Disease

## 2023-12-14 ENCOUNTER — Ambulatory Visit: Admitting: Family Medicine

## 2023-12-17 ENCOUNTER — Ambulatory Visit (INDEPENDENT_AMBULATORY_CARE_PROVIDER_SITE_OTHER): Payer: Medicare HMO

## 2023-12-17 DIAGNOSIS — I5022 Chronic systolic (congestive) heart failure: Secondary | ICD-10-CM | POA: Diagnosis not present

## 2023-12-19 LAB — CUP PACEART REMOTE DEVICE CHECK
Battery Remaining Longevity: 100 mo
Battery Remaining Percentage: 80 %
Battery Voltage: 2.99 V
Brady Statistic RV Percent Paced: 1 %
Date Time Interrogation Session: 20250915020705
HighPow Impedance: 65 Ohm
Implantable Lead Connection Status: 753985
Implantable Lead Implant Date: 20101111
Implantable Lead Location: 753860
Implantable Lead Model: 7121
Implantable Pulse Generator Implant Date: 20230317
Lead Channel Impedance Value: 590 Ohm
Lead Channel Pacing Threshold Amplitude: 1 V
Lead Channel Pacing Threshold Pulse Width: 0.5 ms
Lead Channel Sensing Intrinsic Amplitude: 12 mV
Lead Channel Setting Pacing Amplitude: 2.5 V
Lead Channel Setting Pacing Pulse Width: 0.5 ms
Lead Channel Setting Sensing Sensitivity: 0.5 mV
Pulse Gen Serial Number: 210001302
Zone Setting Status: 755011

## 2023-12-20 ENCOUNTER — Ambulatory Visit: Payer: Self-pay | Admitting: Cardiology

## 2023-12-24 ENCOUNTER — Telehealth: Payer: Self-pay | Admitting: Family Medicine

## 2023-12-24 NOTE — Telephone Encounter (Signed)
 11/19/2023 same day cancellation 12/14/2023 no show  Final warning sent via mail and mychart.

## 2023-12-24 NOTE — Progress Notes (Signed)
Remote ICD Transmission.

## 2023-12-25 ENCOUNTER — Encounter: Payer: Self-pay | Admitting: Cardiovascular Disease

## 2023-12-25 ENCOUNTER — Ambulatory Visit: Attending: Cardiovascular Disease | Admitting: Cardiovascular Disease

## 2023-12-25 VITALS — BP 110/80 | HR 81 | Ht 71.0 in | Wt 265.2 lb

## 2023-12-25 DIAGNOSIS — I5022 Chronic systolic (congestive) heart failure: Secondary | ICD-10-CM

## 2023-12-25 DIAGNOSIS — E785 Hyperlipidemia, unspecified: Secondary | ICD-10-CM | POA: Diagnosis not present

## 2023-12-25 DIAGNOSIS — I2510 Atherosclerotic heart disease of native coronary artery without angina pectoris: Secondary | ICD-10-CM | POA: Diagnosis not present

## 2023-12-25 DIAGNOSIS — I739 Peripheral vascular disease, unspecified: Secondary | ICD-10-CM

## 2023-12-25 MED ORDER — NITROGLYCERIN 0.4 MG SL SUBL: 0.4000 mg | SUBLINGUAL_TABLET | SUBLINGUAL | 11 refills | Status: AC | PRN

## 2023-12-25 NOTE — Patient Instructions (Signed)
 Medication Instructions:  Your physician has recommended you make the following change in your medication:  STOP: clopidogrel  (Plavix )  *If you need a refill on your cardiac medications before your next appointment, please call your pharmacy*  Lab Work: NONE  If you have labs (blood work) drawn today and your tests are completely normal, you will receive your results only by: MyChart Message (if you have MyChart) OR A paper copy in the mail If you have any lab test that is abnormal or we need to change your treatment, we will call you to review the results.  Testing/Procedures: NONE  Follow-Up: At [facility], you and your health needs are our priority.  As part of our continuing mission to provide you with exceptional heart care, our providers are all part of one team.  This team includes your primary Cardiologist (physician) and Advanced Practice Providers or APPs (Physician Assistants and Nurse Practitioners) who all work together to provide you with the care you need, when you need it.  Your next appointment:   6 month(s)  Provider:   Deatrice Cage, MD

## 2023-12-25 NOTE — Progress Notes (Signed)
 Cardiology Office Note   Date:  12/25/2023   ID:  Billy [name], DOB 05/29/1965, MRN [id number]  PCP:  Berneta Elsie Sayre, MD  Cardiologist:  Dr. Pietro  No chief complaint on file.     History of Present Illness: Billy [name] is a 58 y.o. male who is here today for follow-up visit regarding peripheral arterial disease.  The patient had previous cardiac arrest in November 2010 status post ICD placement.  He had previous CABG.  Other medical problems include hypertension, previous tobacco use , PAD and hyperlipidemia.  He had previous left SFA stent many years ago.    He had unstable angina in October 2020.  Cardiac catheterization showed severe native 2-vessel coronary artery disease.  SVG to RCA and SVG to OM 2 were known to be occluded.  LIMA to LAD was found to be atretic with competitive flow due to minimal disease in the native LAD.  SVG to first diagonal was patent.  There was 80% ostial left circumflex stenosis which was treated with scoring balloon angioplasty.    He had worsening right calf claudication in 2021.  Angiography was performed in February 2021 which showed  severe stenosis affecting the proximal right external iliac artery with occluded mid SFA with reconstitution distally via collaterals from the profunda.  I performed successful self-expanding stent placement to the right external iliac artery.  He had issues with recurrent ICD shocks in 2022.  He underwent cardiac catheterization in February 2022 which showed significant multivessel CAD with chronic occlusion of first diagonal, mild LAD disease, patent ostial left circumflex, chronic occlusion of the mid AV groove left circumflex with collaterals and chronically occluded right coronary artery with left-to-right collaterals.  LIMA to LAD was atretic as native LAD had no obstructive disease.  He had left hip surgery at [facility] which required revision twice.    He has been doing well overall with no chest pain or  worsening dyspnea.   Reported worsening left calf claudication this year.    Most recent noninvasive vascular testing in May 2024 showed an ABI of 0.72 on the right and 0.59 on the left.  Duplex showed patent right external iliac artery stent.  He has bilateral SFA occlusion.  I referred him to a supervised exercise therapy program.  He did not attend the program but has been exercising on his own and walking about 15 to 20 minutes daily.  He reports improvement in calf claudication overall.  No chest pain or shortness of breath.  Past Medical History:  Diagnosis Date   Aortic atherosclerosis    on CXR   Blood in stool    C. difficile colitis    a. remote hx 2010.   Cardiac arrest - ventricular fibrillation 02/11/2009   a. 02/2009 s/p St Jude ICD.   Chronic combined systolic and diastolic CHF (congestive heart failure) ([name])    Chronic kidney disease, stage 3a ([name])    Coronary artery disease    a. PCI to Cx age 80. b. CABGx4 in 2003. c. BMS to OM1 in 12/2007. d. PTCA to Cx 01/2019.   Former tobacco use    Hyperlipidemia    Hypertension    Ischemic cardiomyopathy    Marijuana abuse    Myocardial infarct ([name])    NON-ST-SEGMENT ELEVATION MI   Obesity    PAD (peripheral artery disease)    PAF (paroxysmal atrial fibrillation) ([name])    Pathologic fracture of left acetabulum 07/03/2021   PSVT (paroxysmal supraventricular tachycardia)  Ventricular tachycardia [facility])     Past Surgical History:  Procedure Laterality Date   ABDOMINAL AORTOGRAM W/LOWER EXTREMITY N/A 05/14/2019   Procedure: ABDOMINAL AORTOGRAM W/LOWER EXTREMITY;  Surgeon: Darron Deatrice LABOR, MD;  Location: [facility] INVASIVE CV LAB;  Service: Cardiovascular;  Laterality: N/A;   CARDIAC DEFIBRILLATOR PLACEMENT     STJ single chamber ICD implanted for secondary prevention   CIRCUMCISION     COLONOSCOPY WITH PROPOFOL  N/A 01/11/2023   Procedure: COLONOSCOPY WITH PROPOFOL ;  Surgeon: Aneita Gwendlyn DASEN, MD;  Location: [facility] END[facility]COPY;   Service: Gastroenterology;  Laterality: N/A;   CORONARY ARTERY BYPASS GRAFT  06/17/01   X 4   CORONARY BALLOON ANGIOPLASTY N/A 01/22/2019   Procedure: CORONARY BALLOON ANGIOPLASTY;  Surgeon: Anner Alm ORN, MD;  Location: [facility] INVASIVE CV LAB;  Service: Cardiovascular;  Laterality: N/A;   HEMOSTASIS CLIP PLACEMENT  01/11/2023   Procedure: HEMOSTASIS CLIP PLACEMENT;  Surgeon: Aneita Gwendlyn DASEN, MD;  Location: [facility];  Service: Gastroenterology;;   HIP CLOSED REDUCTION Left 07/02/2021   Procedure: CLOSED REDUCTION HIP with placement of skeletal traction;  Surgeon: Dozier Soulier, MD;  Location: [facility] OR;  Service: Orthopedics;  Laterality: Left;   ICD GENERATOR CHANGEOUT N/A 06/17/2021   Procedure: ICD GENERATOR CHANGEOUT;  Surgeon: Fernande Elspeth BROCKS, MD;  Location: [facility] INVASIVE CV LAB;  Service: Cardiovascular;  Laterality: N/A;   LEFT HEART CATH AND CORS/GRAFTS ANGIOGRAPHY N/A 01/22/2019   Procedure: LEFT HEART CATH AND CORS/GRAFTS ANGIOGRAPHY;  Surgeon: Anner Alm ORN, MD;  Location: [facility] INVASIVE CV LAB;  Service: Cardiovascular;  Laterality: N/A;   LEFT HEART CATH AND CORS/GRAFTS ANGIOGRAPHY N/A 05/18/2020   Procedure: LEFT HEART CATH AND CORS/GRAFTS ANGIOGRAPHY;  Surgeon: Burnard Debby LABOR, MD;  Location: [facility] INVASIVE CV LAB;  Service: Cardiovascular;  Laterality: N/A;   OPEN REDUCTION INTERNAL FIXATION ACETABULUM FRACTURE POSTERIOR Left 07/04/2021   Procedure: OPEN REDUCTION INTERNAL FIXATION ACETABULUM FRACTURE POSTERIOR;  Surgeon: Celena Sharper, MD;  Location: [facility] OR;  Service: Orthopedics;  Laterality: Left;   PERIPHERAL VASCULAR INTERVENTION Right 05/14/2019   Procedure: PERIPHERAL VASCULAR INTERVENTION;  Surgeon: Darron Deatrice LABOR, MD;  Location: [facility]VASIVE CV LAB;  Service: Cardiovascular;  Laterality: Right;   POLYPECTOMY  01/11/2023   Procedure: POLYPECTOMY;  Surgeon: Aneita Gwendlyn DASEN, MD;  Location: [facility];  Service: Gastroenterology;;   SVT ABLATION N/A 06/21/2020   Procedure: SVT  ABLATION;  Surgeon: Waddell Danelle ORN, MD;  Location: [facility] INVASIVE CV LAB;  Service: Cardiovascular;  Laterality: N/A;     Current Outpatient Medications  Medication Sig Dispense Refill   alum & mag hydroxide-simeth (MAALOX MAX) 400-400-40 MG/5ML suspension Take 10 mLs by mouth every 6 (six) hours as needed for indigestion. 355 mL 0   atorvastatin  (LIPITOR ) 80 MG tablet TAKE 1 TABLET EVERY DAY 90 tablet 1   carvedilol  (COREG ) 3.125 MG tablet Take 1 tablet (3.125 mg total) by mouth 2 (two) times daily with a meal. 180 tablet 3   ELIQUIS  5 MG TABS tablet TAKE 1 TABLET TWICE DAILY 180 tablet 3   ezetimibe  (ZETIA ) 10 MG tablet TAKE 1 TABLET EVERY DAY 90 tablet 3   famotidine  (PEPCID ) 40 MG tablet Take 1 tablet (40 mg total) by mouth at bedtime. 30 tablet 11   fluticasone  (FLONASE ) 50 MCG/ACT nasal spray Place 2 sprays into both nostrils 2 (two) times daily. 16 g 11   furosemide  (LASIX ) 40 MG tablet Take 1 tablet (40 mg total) by mouth daily. 90 tablet 3   isosorbide  mononitrate (IMDUR ) 30  MG 24 hr tablet TAKE 1/2 TABLET EVERY DAY 45 tablet 3   JARDIANCE  10 MG TABS tablet TAKE 1 TABLET EVERY DAY 90 tablet 3   levothyroxine  (SYNTHROID ) 50 MCG tablet TAKE 1 TABLET BY MOUTH DAILY BEFORE BREAKFAST. 30 MINUTES BEFORE HAVING BREAKFAST. 90 tablet 3   montelukast  (SINGULAIR ) 10 MG tablet TAKE 1 TABLET EVERY DAY AS NEEDED FOR ALLERGIES 90 tablet 1   pantoprazole  (PROTONIX ) 40 MG tablet Take 1 tablet (40 mg total) by mouth daily. 90 tablet 1   potassium chloride  SA (KLOR-CON  M) 20 MEQ tablet Take 1 tablet (20 mEq total) by mouth daily. 90 tablet 3   sacubitril -valsartan  (ENTRESTO ) 24-26 MG TAKE 1 TABLET TWICE DAILY 180 tablet 1   spironolactone  (ALDACTONE ) 25 MG tablet Take 0.5 tablets (12.5 mg total) by mouth daily. 45 tablet 3   nitroGLYCERIN  (NITROSTAT ) 0.4 MG SL tablet Place 1 tablet (0.4 mg total) under the tongue every 5 (five) minutes as needed for chest pain. 25 tablet 11   No current  facility-administered medications for this visit.    Allergies:   Patient has no known allergies.    Social History:  The patient  reports that he quit smoking about 4 years ago. His smoking use included cigarettes. He started smoking about 24 years ago. He has a 40 pack-year smoking history. He has never been exposed to tobacco smoke. He has never used smokeless tobacco. He reports that he does not drink alcohol and does not use drugs.   Family History:  The patient's family history includes Cancer in his paternal uncle and paternal uncle; Heart attack in his father; Heart disease in his sister; Hypertension in his mother and another family member.    ROS:  Please see the history of present illness.   Otherwise, review of systems are positive for none.   All other systems are reviewed and negative.    PHYSICAL EXAM: VS:  BP 110/80   Pulse 81   Ht 5' 11 (1.803 m)   Wt 265 lb 3.2 oz (120.3 kg)   SpO2 94%   BMI 36.99 kg/m  , BMI Body mass index is 36.99 kg/m. GEN: Well nourished, well developed, in no acute distress  HEENT: normal  Neck: no JVD, carotid bruits, or masses Cardiac: RRR; no murmurs, rubs, or gallops,no edema  Respiratory:  clear to auscultation bilaterally, normal work of breathing GI: soft, nontender, nondistended, + BS MS: no deformity or atrophy  Skin: warm and dry, no rash Neuro:  Strength and sensation are intact Psych: euthymic mood, full affect   EKG:  EKG is not ordered today.    Recent Labs: 05/11/2023: TSH 2.47 06/13/2023: ALT 19; BUN 16; Creatinine, Ser 1.26; Potassium 4.9; Sodium 143 06/21/2023: Hemoglobin 17.9; Platelet Count 266    Lipid Panel    Component Value Date/Time   CHOL 129 05/11/2023 1115   CHOL 118 12/23/2021 0818   TRIG 132.0 05/11/2023 1115   HDL 37.10 (L) 05/11/2023 1115   HDL 32 (L) 12/23/2021 0818   CHOLHDL 3 05/11/2023 1115   VLDL 26.4 05/11/2023 1115   LDLCALC 65 05/11/2023 1115   LDLCALC 65 12/23/2021 0818   LDLDIRECT  95.0 11/29/2020 1018      Wt Readings from Last 3 Encounters:  12/25/23 265 lb 3.2 oz (120.3 kg)  06/25/23 262 lb 5.6 oz (119 kg)  06/21/23 261 lb 6.4 oz (118.6 kg)           No data to display  ASSESSMENT AND PLAN:  1.  Peripheral arterial disease: Status post stent placement to the right external iliac artery with .  He has known bilateral SFA occlusion.  Claudication improved with exercise program and thus currently there is no indication for revascularization.  I encouraged him to increase his exercise to a minimal of 30 minutes daily.    2.  Coronary artery disease involving native coronary arteries without angina: He seems to be stable from a cardiac standpoint.  He does not have to use sublingual nitroglycerin  but requested a refill anyway just in case.  This was refilled today. Given no recent PCI and no recent endovascular intervention, I elected to discontinue clopidogrel  given that he is on long-term anticoagulation with Eliquis .  3.  Chronic systolic heart failure: He appears to be euvolemic and currently on optimal medical therapy.    4.  Hyperlipidemia: Continue treatment with atorvastatin  and Zetia  with a target LDL of less than 70.  I reviewed most recent lipid profile which showed an LDL of 65.  5.  Paroxysmal atrial fibrillation: He is in sinus rhythm.  He is currently on anticoagulation with Eliquis .  7.  Recurrent ICD shocks: No recent episodes.  He is no longer on amiodarone .   Disposition: Follow-up with me in 6 months.  Signed,  Deatrice Cage, MD  12/25/2023 9:44 AM    [facility]

## 2024-03-11 ENCOUNTER — Other Ambulatory Visit: Payer: Self-pay | Admitting: Family Medicine

## 2024-03-11 DIAGNOSIS — U071 COVID-19: Secondary | ICD-10-CM

## 2024-03-16 ENCOUNTER — Other Ambulatory Visit: Payer: Self-pay | Admitting: Family Medicine

## 2024-03-16 ENCOUNTER — Other Ambulatory Visit: Payer: Self-pay | Admitting: Cardiology

## 2024-03-16 DIAGNOSIS — E039 Hypothyroidism, unspecified: Secondary | ICD-10-CM

## 2024-03-17 ENCOUNTER — Ambulatory Visit: Payer: Medicare HMO

## 2024-03-17 DIAGNOSIS — I739 Peripheral vascular disease, unspecified: Secondary | ICD-10-CM

## 2024-03-19 LAB — CUP PACEART REMOTE DEVICE CHECK
Battery Remaining Longevity: 98 mo
Battery Remaining Percentage: 79 %
Battery Voltage: 2.99 V
Brady Statistic RV Percent Paced: 1 %
Date Time Interrogation Session: 20251215020752
HighPow Impedance: 61 Ohm
Implantable Lead Connection Status: 753985
Implantable Lead Implant Date: 20101111
Implantable Lead Location: 753860
Implantable Lead Model: 7121
Implantable Pulse Generator Implant Date: 20230317
Lead Channel Impedance Value: 560 Ohm
Lead Channel Pacing Threshold Amplitude: 1 V
Lead Channel Pacing Threshold Pulse Width: 0.5 ms
Lead Channel Sensing Intrinsic Amplitude: 12 mV
Lead Channel Setting Pacing Amplitude: 2.5 V
Lead Channel Setting Pacing Pulse Width: 0.5 ms
Lead Channel Setting Sensing Sensitivity: 0.5 mV
Pulse Gen Serial Number: 210001302
Zone Setting Status: 755011

## 2024-03-21 NOTE — Progress Notes (Signed)
 Remote ICD Transmission

## 2024-03-31 ENCOUNTER — Ambulatory Visit: Payer: Self-pay | Admitting: Cardiology

## 2024-04-01 NOTE — Progress Notes (Unsigned)
" °  Electrophysiology Office Note:   Date:  04/02/2024  ID:  Gladstone Shed, DOB 1966-01-10, MRN [id number]  Primary Cardiologist: Redell Shallow, MD Primary Heart Failure: None Electrophysiologist: Artyom Stencel Gladis [name], MD      History of Present Illness:   Billy [name] is a 58 y.o. male with h/o atrial tachycardia, chronic systolic heart failure due to ischemic cardiomyopathy, ventricular tachycardia, VF arrest, coronary artery disease post CABG, hypertension, hyperlipidemia, atrial fibrillation, PVD post SFA stent seen today for routine electrophysiology followup.   He feels well today.  He has no acute complaints.  He is able to do his daily activities.  He has had a short run of ventricular tachycardia, but no therapy from his defibrillator.  He was unaware of this episode. he denies chest pain, palpitations, dyspnea, PND, orthopnea, nausea, vomiting, dizziness, syncope, edema, weight gain, or early satiety.   Review of systems complete and found to be negative unless listed in HPI.      EP Information / Studies Reviewed:    EKG is ordered today. Personal review as below.  EKG Interpretation Date/Time:  Wednesday April 02 2024 09:05:47 EST Ventricular Rate:  76 PR Interval:  140 QRS Duration:  98 QT Interval:  410 QTC Calculation: 461 R Axis:   4  Text Interpretation: Sinus rhythm with occasional Premature ventricular complexes Possible Left atrial enlargement Inferior infarct (cited on or before 05-Jun-2023) When compared with ECG of 05-Jun-2023 10:10, Premature atrial complexes are no longer Present Questionable change in QRS axis Confirmed by Mallissa Lorenzen ([id number]) on 04/02/2024 9:24:03 AM   ICD Interrogation-  reviewed in detail today,  See PACEART report.  Device History: Abbott Single Chamber ICD implanted 02/20/2009 for chronic systolic heart failure History of appropriate therapy: Yes History of AAD therapy: No   Risk Assessment/Calculations:    CHA2DS2-VASc  Score =     This indicates a  % annual risk of stroke. The patient's score is based upon:             Physical Exam:   VS:  BP 105/71 (BP Location: Left Arm, Patient Position: Sitting, Cuff Size: Large)   Pulse 76   Ht 6' (1.829 m)   Wt 279 lb 4.8 oz (126.7 kg)   SpO2 95%   BMI 37.88 kg/m    Wt Readings from Last 3 Encounters:  04/02/24 279 lb 4.8 oz (126.7 kg)  12/25/23 265 lb 3.2 oz (120.3 kg)  06/25/23 262 lb 5.6 oz (119 kg)     GEN: Well nourished, well developed in no acute distress NECK: No JVD; No carotid bruits CARDIAC: Regular rate and rhythm, no murmurs, rubs, gallops RESPIRATORY:  Clear to auscultation without rales, wheezing or rhonchi  ABDOMEN: Soft, non-tender, non-distended EXTREMITIES:  No edema; No deformity   ASSESSMENT AND PLAN:    Chronic systolic dysfunction s/p Abbott single chamber ICD  euvolemic today Stable on an appropriate medical regimen Normal ICD function See Pace Art report No changes today  2.  Ventricular tachycardia: None recently noted on device interrogation.  3.  Coronary artery disease: No current chest pain  4.  Hypertension:well controlled  5.  Paroxysmal atrial fibrillation: Remains in sinus rhythm.  No changes.  6.  Secondary hypercoagulable state: On Eliquis   Disposition:   Follow up with EP Team in 12 months   Signed, Ellieana Dolecki Gladis [name], MD  "

## 2024-04-02 ENCOUNTER — Other Ambulatory Visit: Payer: Self-pay

## 2024-04-02 ENCOUNTER — Encounter: Payer: Self-pay | Admitting: Cardiology

## 2024-04-02 ENCOUNTER — Ambulatory Visit: Admitting: Cardiology

## 2024-04-02 VITALS — BP 105/71 | HR 76 | Ht 72.0 in | Wt 279.3 lb

## 2024-04-02 DIAGNOSIS — D6869 Other thrombophilia: Secondary | ICD-10-CM | POA: Diagnosis not present

## 2024-04-02 DIAGNOSIS — I472 Ventricular tachycardia, unspecified: Secondary | ICD-10-CM

## 2024-04-02 DIAGNOSIS — I480 Paroxysmal atrial fibrillation: Secondary | ICD-10-CM

## 2024-04-02 DIAGNOSIS — I2510 Atherosclerotic heart disease of native coronary artery without angina pectoris: Secondary | ICD-10-CM | POA: Diagnosis not present

## 2024-04-02 DIAGNOSIS — I5022 Chronic systolic (congestive) heart failure: Secondary | ICD-10-CM | POA: Diagnosis not present

## 2024-04-02 DIAGNOSIS — I10 Essential (primary) hypertension: Secondary | ICD-10-CM | POA: Diagnosis not present

## 2024-04-02 LAB — CUP PACEART INCLINIC DEVICE CHECK
Battery Remaining Longevity: 99 mo
Brady Statistic RV Percent Paced: 0.01 %
Date Time Interrogation Session: 20251231201706
HighPow Impedance: 64.8888
Implantable Lead Connection Status: 753985
Implantable Lead Implant Date: 20101111
Implantable Lead Location: 753860
Implantable Lead Model: 7121
Implantable Pulse Generator Implant Date: 20230317
Lead Channel Impedance Value: 562.5 Ohm
Lead Channel Pacing Threshold Amplitude: 0.75 V
Lead Channel Pacing Threshold Amplitude: 0.75 V
Lead Channel Pacing Threshold Pulse Width: 0.5 ms
Lead Channel Pacing Threshold Pulse Width: 0.5 ms
Lead Channel Sensing Intrinsic Amplitude: 12 mV
Lead Channel Setting Pacing Amplitude: 2.5 V
Lead Channel Setting Pacing Pulse Width: 0.5 ms
Lead Channel Setting Sensing Sensitivity: 0.5 mV
Pulse Gen Serial Number: 210001302
Zone Setting Status: 755011

## 2024-04-02 NOTE — Patient Instructions (Signed)
 Medication Instructions:  Your physician recommends that you continue on your current medications as directed. Please refer to the Current Medication list given to you today.  *If you need a refill on your cardiac medications before your next appointment, please call your pharmacy*  Lab Work: None ordered.  If you have labs (blood work) drawn today and your tests are completely normal, you will receive your results only by: MyChart Message (if you have MyChart) OR A paper copy in the mail If you have any lab test that is abnormal or we need to change your treatment, we will call you to review the results.  Testing/Procedures: None ordered.   Follow-Up: At [facility], you and your health needs are our priority.  As part of our continuing mission to provide you with exceptional heart care, our providers are all part of one team.  This team includes your primary Cardiologist (physician) and Advanced Practice Providers or APPs (Physician Assistants and Nurse Practitioners) who all work together to provide you with the care you need, when you need it.  Your next appointment:   12 months with Dr Inocencio

## 2024-04-04 MED ORDER — FUROSEMIDE 40 MG PO TABS: 40.0000 mg | ORAL_TABLET | Freq: Every day | ORAL | 2 refills | Status: AC

## 2024-04-05 ENCOUNTER — Ambulatory Visit: Payer: Self-pay | Admitting: Cardiology

## 2024-04-06 ENCOUNTER — Other Ambulatory Visit: Payer: Self-pay | Admitting: Cardiology

## 2024-04-06 DIAGNOSIS — E7800 Pure hypercholesterolemia, unspecified: Secondary | ICD-10-CM

## 2024-04-07 DIAGNOSIS — K219 Gastro-esophageal reflux disease without esophagitis: Secondary | ICD-10-CM

## 2024-04-07 MED ORDER — PANTOPRAZOLE SODIUM 40 MG PO TBEC: 40.0000 mg | DELAYED_RELEASE_TABLET | Freq: Every day | ORAL | 1 refills | Status: AC

## 2024-05-01 ENCOUNTER — Other Ambulatory Visit: Payer: Self-pay | Admitting: Cardiology

## 2024-05-01 DIAGNOSIS — Z951 Presence of aortocoronary bypass graft: Secondary | ICD-10-CM

## 2024-05-02 ENCOUNTER — Ambulatory Visit: Payer: Medicare HMO

## 2024-05-05 ENCOUNTER — Ambulatory Visit

## 2024-05-05 VITALS — Ht 72.0 in | Wt 270.0 lb

## 2024-05-05 DIAGNOSIS — Z0000 Encounter for general adult medical examination without abnormal findings: Secondary | ICD-10-CM

## 2024-05-05 DIAGNOSIS — Z87891 Personal history of nicotine dependence: Secondary | ICD-10-CM | POA: Diagnosis not present

## 2024-05-05 DIAGNOSIS — Z122 Encounter for screening for malignant neoplasm of respiratory organs: Secondary | ICD-10-CM

## 2024-06-13 ENCOUNTER — Ambulatory Visit: Admitting: Family Medicine

## 2024-06-16 ENCOUNTER — Ambulatory Visit

## 2024-07-03 ENCOUNTER — Ambulatory Visit: Admitting: Family Medicine

## 2024-09-15 ENCOUNTER — Ambulatory Visit

## 2024-12-15 ENCOUNTER — Ambulatory Visit

## 2025-03-16 ENCOUNTER — Ambulatory Visit

## 2025-05-11 ENCOUNTER — Ambulatory Visit
